# Patient Record
Sex: Male | Born: 1955
Health system: Southern US, Community
[De-identification: ages and names within clinical notes are randomized; demographics above are authoritative.]

## PROBLEM LIST (undated history)

## (undated) DIAGNOSIS — I10 Essential (primary) hypertension: Secondary | ICD-10-CM

## (undated) DIAGNOSIS — M19012 Primary osteoarthritis, left shoulder: Secondary | ICD-10-CM

## (undated) DIAGNOSIS — Z951 Presence of aortocoronary bypass graft: Secondary | ICD-10-CM

## (undated) DIAGNOSIS — N189 Chronic kidney disease, unspecified: Secondary | ICD-10-CM

## (undated) DIAGNOSIS — E119 Type 2 diabetes mellitus without complications: Secondary | ICD-10-CM

## (undated) DIAGNOSIS — M75111 Incomplete rotator cuff tear or rupture of right shoulder, not specified as traumatic: Secondary | ICD-10-CM

## (undated) DIAGNOSIS — M1990 Unspecified osteoarthritis, unspecified site: Secondary | ICD-10-CM

## (undated) DIAGNOSIS — I2510 Atherosclerotic heart disease of native coronary artery without angina pectoris: Secondary | ICD-10-CM

## (undated) DIAGNOSIS — M7542 Impingement syndrome of left shoulder: Secondary | ICD-10-CM

## (undated) DIAGNOSIS — I219 Acute myocardial infarction, unspecified: Secondary | ICD-10-CM

## (undated) DIAGNOSIS — K635 Polyp of colon: Secondary | ICD-10-CM

## (undated) DIAGNOSIS — E7800 Pure hypercholesterolemia, unspecified: Secondary | ICD-10-CM

## (undated) DIAGNOSIS — J42 Unspecified chronic bronchitis: Secondary | ICD-10-CM

## (undated) DIAGNOSIS — K219 Gastro-esophageal reflux disease without esophagitis: Secondary | ICD-10-CM

## (undated) DIAGNOSIS — S45119A Laceration of brachial artery, unspecified side, initial encounter: Secondary | ICD-10-CM

## (undated) DIAGNOSIS — R252 Cramp and spasm: Secondary | ICD-10-CM

## (undated) DIAGNOSIS — M519 Unspecified thoracic, thoracolumbar and lumbosacral intervertebral disc disorder: Secondary | ICD-10-CM

## (undated) DIAGNOSIS — K5730 Diverticulosis of large intestine without perforation or abscess without bleeding: Secondary | ICD-10-CM

## (undated) HISTORY — PX: COLONOSCOPY: SHX174

## (undated) HISTORY — DX: Gastro-esophageal reflux disease without esophagitis: K21.9

## (undated) HISTORY — PX: HAND SURGERY: SHX662

## (undated) HISTORY — PX: TONSILLECTOMY: SUR1361

## (undated) HISTORY — PX: BACK SURGERY: SHX140

## (undated) HISTORY — DX: Polyp of colon: K63.5

## (undated) HISTORY — PX: CARDIAC SURGERY: SHX584

## (undated) HISTORY — PX: OTHER SURGICAL HISTORY: SHX169

## (undated) HISTORY — PX: LUMBAR DISC SURGERY: SHX700

## (undated) SURGERY — Surgical Case
Anesthesia: *Unknown

---

## 1998-10-22 ENCOUNTER — Emergency Department (HOSPITAL_COMMUNITY): Admission: EM | Admit: 1998-10-22 | Discharge: 1998-10-22 | Payer: Self-pay | Admitting: Emergency Medicine

## 1999-09-28 ENCOUNTER — Ambulatory Visit (HOSPITAL_COMMUNITY): Admission: RE | Admit: 1999-09-28 | Discharge: 1999-09-28 | Payer: Self-pay | Admitting: Family Medicine

## 1999-09-28 ENCOUNTER — Encounter: Payer: Self-pay | Admitting: Family Medicine

## 2000-03-14 ENCOUNTER — Emergency Department (HOSPITAL_COMMUNITY): Admission: EM | Admit: 2000-03-14 | Discharge: 2000-03-14 | Payer: Self-pay | Admitting: *Deleted

## 2000-10-06 ENCOUNTER — Ambulatory Visit (HOSPITAL_COMMUNITY): Admission: RE | Admit: 2000-10-06 | Discharge: 2000-10-07 | Payer: Self-pay | Admitting: Cardiology

## 2001-01-03 ENCOUNTER — Ambulatory Visit (HOSPITAL_COMMUNITY): Admission: RE | Admit: 2001-01-03 | Discharge: 2001-01-03 | Payer: Self-pay | Admitting: Internal Medicine

## 2001-03-12 ENCOUNTER — Encounter: Payer: Self-pay | Admitting: Emergency Medicine

## 2001-03-12 ENCOUNTER — Inpatient Hospital Stay (HOSPITAL_COMMUNITY): Admission: EM | Admit: 2001-03-12 | Discharge: 2001-03-14 | Payer: Self-pay | Admitting: Emergency Medicine

## 2001-03-15 ENCOUNTER — Inpatient Hospital Stay (HOSPITAL_COMMUNITY): Admission: EM | Admit: 2001-03-15 | Discharge: 2001-03-16 | Payer: Self-pay | Admitting: Emergency Medicine

## 2001-03-15 ENCOUNTER — Encounter: Payer: Self-pay | Admitting: Emergency Medicine

## 2001-09-02 ENCOUNTER — Encounter: Payer: Self-pay | Admitting: Emergency Medicine

## 2001-09-02 ENCOUNTER — Inpatient Hospital Stay (HOSPITAL_COMMUNITY): Admission: EM | Admit: 2001-09-02 | Discharge: 2001-09-06 | Payer: Self-pay | Admitting: Emergency Medicine

## 2001-09-04 ENCOUNTER — Encounter: Payer: Self-pay | Admitting: Cardiology

## 2001-09-05 ENCOUNTER — Encounter: Payer: Self-pay | Admitting: Cardiology

## 2001-09-06 ENCOUNTER — Encounter: Payer: Self-pay | Admitting: Cardiology

## 2001-12-20 ENCOUNTER — Encounter: Payer: Self-pay | Admitting: Cardiology

## 2001-12-20 ENCOUNTER — Encounter: Admission: RE | Admit: 2001-12-20 | Discharge: 2001-12-20 | Payer: Self-pay | Admitting: Cardiology

## 2002-02-22 ENCOUNTER — Encounter: Payer: Self-pay | Admitting: Orthopedic Surgery

## 2002-02-22 ENCOUNTER — Inpatient Hospital Stay (HOSPITAL_COMMUNITY): Admission: AD | Admit: 2002-02-22 | Discharge: 2002-02-24 | Payer: Self-pay | Admitting: Orthopedic Surgery

## 2002-02-23 ENCOUNTER — Encounter: Payer: Self-pay | Admitting: Orthopedic Surgery

## 2002-02-24 ENCOUNTER — Encounter: Payer: Self-pay | Admitting: Orthopedic Surgery

## 2003-08-09 DIAGNOSIS — Z951 Presence of aortocoronary bypass graft: Secondary | ICD-10-CM

## 2003-08-09 HISTORY — PX: CORONARY ARTERY BYPASS GRAFT: SHX141

## 2003-08-09 HISTORY — DX: Presence of aortocoronary bypass graft: Z95.1

## 2003-09-03 ENCOUNTER — Inpatient Hospital Stay (HOSPITAL_COMMUNITY): Admission: EM | Admit: 2003-09-03 | Discharge: 2003-09-14 | Payer: Self-pay | Admitting: Emergency Medicine

## 2003-09-26 ENCOUNTER — Encounter: Admission: RE | Admit: 2003-09-26 | Discharge: 2003-09-26 | Payer: Self-pay | Admitting: Cardiology

## 2003-10-04 ENCOUNTER — Inpatient Hospital Stay (HOSPITAL_COMMUNITY): Admission: EM | Admit: 2003-10-04 | Discharge: 2003-10-07 | Payer: Self-pay | Admitting: Emergency Medicine

## 2004-01-19 ENCOUNTER — Other Ambulatory Visit: Payer: Self-pay

## 2004-02-19 ENCOUNTER — Emergency Department (HOSPITAL_COMMUNITY): Admission: EM | Admit: 2004-02-19 | Discharge: 2004-02-19 | Payer: Self-pay | Admitting: Emergency Medicine

## 2004-05-19 ENCOUNTER — Ambulatory Visit (HOSPITAL_COMMUNITY): Admission: RE | Admit: 2004-05-19 | Discharge: 2004-05-19 | Payer: Self-pay | Admitting: Internal Medicine

## 2004-06-03 ENCOUNTER — Observation Stay (HOSPITAL_COMMUNITY): Admission: EM | Admit: 2004-06-03 | Discharge: 2004-06-04 | Payer: Self-pay | Admitting: Emergency Medicine

## 2004-10-05 ENCOUNTER — Observation Stay (HOSPITAL_COMMUNITY): Admission: EM | Admit: 2004-10-05 | Discharge: 2004-10-07 | Payer: Self-pay | Admitting: Emergency Medicine

## 2004-12-03 ENCOUNTER — Ambulatory Visit: Payer: Self-pay | Admitting: Internal Medicine

## 2005-09-02 ENCOUNTER — Ambulatory Visit: Payer: Self-pay | Admitting: Internal Medicine

## 2006-03-24 ENCOUNTER — Ambulatory Visit: Payer: Self-pay | Admitting: Internal Medicine

## 2006-04-24 ENCOUNTER — Emergency Department: Payer: Self-pay | Admitting: Emergency Medicine

## 2006-09-08 ENCOUNTER — Emergency Department (HOSPITAL_COMMUNITY): Admission: EM | Admit: 2006-09-08 | Discharge: 2006-09-09 | Payer: Self-pay | Admitting: Emergency Medicine

## 2006-11-20 ENCOUNTER — Ambulatory Visit: Payer: Self-pay | Admitting: Internal Medicine

## 2007-02-04 ENCOUNTER — Emergency Department: Payer: Self-pay | Admitting: Emergency Medicine

## 2007-02-16 ENCOUNTER — Encounter: Admission: RE | Admit: 2007-02-16 | Discharge: 2007-02-16 | Payer: Self-pay | Admitting: Neurosurgery

## 2007-02-28 ENCOUNTER — Ambulatory Visit: Payer: Self-pay | Admitting: Vascular Surgery

## 2007-02-28 ENCOUNTER — Ambulatory Visit (HOSPITAL_COMMUNITY): Admission: RE | Admit: 2007-02-28 | Discharge: 2007-02-28 | Payer: Self-pay | Admitting: Neurosurgery

## 2007-08-27 ENCOUNTER — Encounter: Payer: Self-pay | Admitting: Internal Medicine

## 2007-08-27 DIAGNOSIS — J302 Other seasonal allergic rhinitis: Secondary | ICD-10-CM | POA: Insufficient documentation

## 2007-08-27 DIAGNOSIS — I2510 Atherosclerotic heart disease of native coronary artery without angina pectoris: Secondary | ICD-10-CM | POA: Insufficient documentation

## 2007-08-27 DIAGNOSIS — J3089 Other allergic rhinitis: Secondary | ICD-10-CM

## 2007-08-27 DIAGNOSIS — E785 Hyperlipidemia, unspecified: Secondary | ICD-10-CM | POA: Insufficient documentation

## 2007-08-27 DIAGNOSIS — K219 Gastro-esophageal reflux disease without esophagitis: Secondary | ICD-10-CM | POA: Insufficient documentation

## 2007-08-27 DIAGNOSIS — J45998 Other asthma: Secondary | ICD-10-CM | POA: Insufficient documentation

## 2007-09-10 ENCOUNTER — Ambulatory Visit: Payer: Self-pay | Admitting: Internal Medicine

## 2007-09-10 DIAGNOSIS — T6391XA Toxic effect of contact with unspecified venomous animal, accidental (unintentional), initial encounter: Secondary | ICD-10-CM | POA: Insufficient documentation

## 2008-01-22 ENCOUNTER — Inpatient Hospital Stay (HOSPITAL_COMMUNITY): Admission: EM | Admit: 2008-01-22 | Discharge: 2008-01-24 | Payer: Self-pay | Admitting: Emergency Medicine

## 2008-01-22 ENCOUNTER — Ambulatory Visit: Payer: Self-pay | Admitting: Internal Medicine

## 2008-03-19 ENCOUNTER — Ambulatory Visit: Payer: Self-pay | Admitting: Internal Medicine

## 2008-10-27 ENCOUNTER — Ambulatory Visit: Payer: Self-pay | Admitting: Internal Medicine

## 2008-11-14 ENCOUNTER — Encounter: Payer: Self-pay | Admitting: Internal Medicine

## 2008-12-28 ENCOUNTER — Emergency Department: Payer: Self-pay | Admitting: Internal Medicine

## 2009-02-26 ENCOUNTER — Encounter: Payer: Self-pay | Admitting: Orthopedic Surgery

## 2009-03-08 ENCOUNTER — Encounter: Payer: Self-pay | Admitting: Orthopedic Surgery

## 2009-04-08 ENCOUNTER — Encounter: Payer: Self-pay | Admitting: Orthopedic Surgery

## 2009-04-16 ENCOUNTER — Telehealth (INDEPENDENT_AMBULATORY_CARE_PROVIDER_SITE_OTHER): Payer: Self-pay | Admitting: *Deleted

## 2009-05-08 ENCOUNTER — Telehealth: Payer: Self-pay | Admitting: Internal Medicine

## 2009-05-08 ENCOUNTER — Telehealth (INDEPENDENT_AMBULATORY_CARE_PROVIDER_SITE_OTHER): Payer: Self-pay | Admitting: *Deleted

## 2009-05-11 ENCOUNTER — Ambulatory Visit: Payer: Self-pay | Admitting: Internal Medicine

## 2009-05-11 DIAGNOSIS — J209 Acute bronchitis, unspecified: Secondary | ICD-10-CM | POA: Insufficient documentation

## 2009-06-30 ENCOUNTER — Emergency Department (HOSPITAL_COMMUNITY): Admission: EM | Admit: 2009-06-30 | Discharge: 2009-06-30 | Payer: Self-pay | Admitting: Emergency Medicine

## 2009-08-04 ENCOUNTER — Telehealth (INDEPENDENT_AMBULATORY_CARE_PROVIDER_SITE_OTHER): Payer: Self-pay | Admitting: *Deleted

## 2009-08-06 ENCOUNTER — Telehealth: Payer: Self-pay | Admitting: Internal Medicine

## 2009-08-25 ENCOUNTER — Ambulatory Visit: Payer: Self-pay | Admitting: Internal Medicine

## 2009-11-24 ENCOUNTER — Ambulatory Visit: Payer: Self-pay | Admitting: Internal Medicine

## 2009-12-10 ENCOUNTER — Telehealth (INDEPENDENT_AMBULATORY_CARE_PROVIDER_SITE_OTHER): Payer: Self-pay | Admitting: *Deleted

## 2009-12-15 ENCOUNTER — Telehealth: Payer: Self-pay | Admitting: Internal Medicine

## 2009-12-15 ENCOUNTER — Ambulatory Visit: Payer: Self-pay | Admitting: Internal Medicine

## 2010-05-25 ENCOUNTER — Ambulatory Visit: Payer: Self-pay | Admitting: Internal Medicine

## 2010-05-27 ENCOUNTER — Telehealth: Payer: Self-pay | Admitting: Internal Medicine

## 2010-06-30 ENCOUNTER — Telehealth: Payer: Self-pay | Admitting: Internal Medicine

## 2010-09-07 NOTE — Progress Notes (Signed)
Summary: z pac request  Phone Note Call from Patient   Caller: Spouse Call For: young Summary of Call: pt c/o of non-productive cough/ sinus drainage/ sore throat x 1 day. denies fever. [facility] [place] spouse- sybil cell # [phone] Initial call taken by: Tivis Ringer,  April 16, 2009 4:40 PM  Follow-up for Phone Call        Please advise if okay to call in zpack.  Pt is allergic to PCN. Thanks! Vernie Murders  April 16, 2009 4:42 PM   Additional Follow-up for Phone Call Additional follow up Details #1::        ok per CY: zpak #1 no refills, 2 today then 1 daily until gone.  [facility] [place] [facility]'s [facility].  pt is aware. Additional Follow-up by: Boone Master CNA,  April 16, 2009 5:39 PM    New/Updated Medications: ZITHROMAX Z-PAK 250 MG TABS (AZITHROMYCIN) 2 today then 1 daily until gone Prescriptions: ZITHROMAX Z-PAK 250 MG TABS (AZITHROMYCIN) 2 today then 1 daily until gone  #1 x 0   Entered by:   Boone Master CNA   Authorized by:   Waymon Budge MD   Signed by:   Boone Master CNA on 04/16/2009   Method used:   [facility]        [phone]ail[facility]       [address]       [place], [place]  [address]       Ph: [phone] or [phone]       Fax: [phone]   RxID:   [id number]

## 2010-09-07 NOTE — Assessment & Plan Note (Signed)
Summary: FOLLOW UP/ MBW   Visit Type:  Follow-up PCP:  Karam  Chief Complaint:  follow up visit .  History of Present Illness: Current Problems:  Hx of INSECT BITE, VENOMOUS (ICD-989.5) DYSLIPIDEMIA (ICD-272.4) CORONARY ARTERY DISEASE (ICD-414.00) G E R D (ICD-530.81) ASTHMA (ICD-493.90) ALLERGIC RHINITIS (ICD-12.43)  55 year old man returning with his wife for follow-up of allergic rhinitis and asthma.  Shifting nasal congestion,  as he changes  sides in bed.  Hot weather is bothering him.  Can blow little from  from his nose, and denies sneezing. chest comfortable, without wheezes lungs he remains on his medications.  He has been approved for a medical support program through one of the drug companies..       Prior Medications Reviewed Using: Patient Recall  Updated Prior Medication List: IMDUR 60 MG  TB24 (ISOSORBIDE MONONITRATE)  * VITAMIN B COMPLEX  TOPROL XL 50 MG  TB24 (METOPROLOL SUCCINATE)  ZETIA 10 MG  TABS (EZETIMIBE)  BAYER ASPIRIN 325 MG  TABS (ASPIRIN) take 1 by mouth once daily TRAZODONE HCL 100 MG  TABS (TRAZODONE HCL)  ZYRTEC ALLERGY 10 MG  TABS (CETIRIZINE HCL) take 1 by mouth once daily PROTONIX 20 MG  TBEC (PANTOPRAZOLE SODIUM) take 1 by mouth once daily CRESTOR 20 MG  TABS (ROSUVASTATIN CALCIUM) take 1 by mouth once daily NITROGLYCERIN 0.4 MG  SUBL (NITROGLYCERIN) use as needed XOPENEX HFA 45 MCG/ACT  AERO (LEVALBUTEROL TARTRATE) 2 puffs three times a day as needed ASTELIN 137 MCG/SPRAY  SOLN (AZELASTINE HCL) ues as needed EPIPEN 2-PAK 0.3 MG/0.3ML (1:1000)  DEVI (EPINEPHRINE HCL (ANAPHYLAXIS))  IPRATROPIUM-ALBUTEROL 0.5-2.5 (3) MG/3ML  SOLN (IPRATROPIUM-ALBUTEROL) 1 neb four times a day as needed FORADIL AEROLIZER 12 MCG  CAPS (FORMOTEROL FUMARATE) 1 twice daily SM LORATADINE 10 MG  TABS (LORATADINE) take 1 by mouth once daily  Current Allergies (reviewed today): ! PENICILLIN  Past Medical History:     Reviewed history from 08/27/2007 and no changes required:       Allergic Rhinitis       Asthma       G E R D   Social History:    Reviewed history from 09/10/2007 and no changes required:       Patient states former smoker.     Review of Systems       Denies headache, sinus drainage, sneezing chest pain, dyspnea, n/v/d, weight loss, fever, edema.     Vital Signs:  Patient Profile:   55 Years Old Male Weight:      208.13 pounds O2 Sat:      94 % O2 treatment:    Room Air Pulse rate:   70 / minute BP sitting:   106 / 68  (left arm) Cuff size:   regular  Vitals Entered By: Reynaldo Minium CMA (March 19, 2008 11:03 AM)             Comments Medications reviewed with patient Reynaldo Minium CMA  March 19, 2008 11:04 AM      Physical Exam  General: A/Ox3; pleasant and cooperative, NAD, SKIN: no rash, lesions NODES: no lymphadenopathy NECK: Supple w/ fair ROM, JVD- none, normal carotid impulses w/o bruits Thyroid- normal to palpation CHEST: Clear to P&A HEART: RRR, no m/g/r heard ABDOMEN: Soft and nl; nml bowel sounds; no organomegaly or masses noted ZDG:UYQI, nl pulses, no edema  NEURO: Grossly intact to observation HEENT: [place]/AT, EOM- wnl, Conjunctivae- clear, PERRLA, TMs- wnl, Nose-snorting and sniffing, narrow but clear nose, Throat- clear and  wnl         Problem # 1:  ALLERGIC RHINITIS (ICD-477.9) symptomatic rhinitis.  Plan: Breathe Right strips, Neti pot nasal lavage.we discussed his Genella Rife.  He will change to Nexium because of his insurance plan support. His updated medication list for this problem includes:    Zyrtec Allergy 10 Mg Tabs (Cetirizine hcl) .Marland Kitchen... Take 1 by mouth once daily    Astelin 137 Mcg/spray Soln (Azelastine hcl) ..... Ues as needed    Sm Loratadine 10 Mg Tabs (Loratadine) .Marland Kitchen... Take 1 by mouth once daily   Problem # 2:  ASTHMA (ICD-493.90) Assessment: Unchanged  Problem # 3:  G E R D (ICD-530.81) Nexium   Medications Added to Medication List This Visit: 1)  Bayer Aspirin 325 Mg Tabs (Aspirin) .... Take 1 by mouth once daily 2)  Zyrtec Allergy 10 Mg Tabs (Cetirizine hcl) .... Take 1 by mouth once daily 3)  Protonix 20 Mg Tbec (Pantoprazole sodium) .... Take 1 by mouth once daily 4)  Nexium 40 Mg Cpdr (esomeprazole Magnesium)  .Marland Kitchen.. 1 daily 5)  Crestor 20 Mg Tabs (Rosuvastatin calcium) .... Take 1 by mouth once daily   Patient Instructions: 1)  Please schedule a follow-up appointment in 6 months. 2)  Changed Protonix to Nexium because of your assistance plan. 3)  Try Breath Right nasal strips for stuffiness, especialy at night. 4)  Try Neti pot to rinse your nasal passages.   Prescriptions: NEXIUM 40 MG  CPDR (ESOMEPRAZOLE MAGNESIUM) 1 daily  #90 x 3   Entered and Authorized by:   Waymon Budge MD   Signed by:   Waymon Budge MD on 03/19/2008   Method used:   Print then Give to Patient   RxID:   [id number]  ]
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
## 2010-09-07 NOTE — Progress Notes (Signed)
Summary: req zpac today- congestion  Phone Note Call from Patient Call back at Home Phone [phone]   Caller: Spouse-sybil Call For: [name] Summary of Call: per spouse- pt c/o chest congestion x 1 day. no fever. says dr Hansini Clodfelter usually prescribes zpac. wants this today if possible. [facility] [address] in [place].  Initial call taken by: Tivis Ringer, CNA,  June 30, 2010 4:39 PM  Follow-up for Phone Call        Spoke with pt wife and she states that pt is having chest congestion, no cough yet but she states he gets this every year and Dr. Maple Hudson calls in a Zpak and this gets rid of it.  I spoke to Dr. Maple Hudson and he ok to send in Z pak. rx sent. pt aware.Carron Curie CMA  June 30, 2010 4:50 PM     New/Updated Medications: ZITHROMAX Z-PAK 250 MG TABS (AZITHROMYCIN) as directed Prescriptions: ZITHROMAX Z-PAK 250 MG TABS (AZITHROMYCIN) as directed  #1 pak x 0   Entered by:   Carron Curie CMA   Authorized by:   Waymon Budge MD   Signed by:   Carron Curie CMA on 06/30/2010   Method used:   [facility] to        [facility] [address] (retail)       [address]       [place], [place]  [address]       Ph: [phone]       Fax: [phone]   RxID:   [id number]

## 2010-09-07 NOTE — Progress Notes (Signed)
Summary: rx request/ sinus  Phone Note Call from Patient Call back at Home Phone [phone]   Caller: Spouse Call For: young Summary of Call: pt's spouse sybil says pt c/o sinus infection x 2 days. cough is non-productive. denies fever. no N or V. [facility] [address] in [place]. pt requests a rx called in.  Initial call taken by: Tivis Ringer, CNA,  [place]  5, 2011 10:03 AM  Follow-up for Phone Call        The patient c/o sinus pain and ptrssure for 2-3 days. He says his nasal passages feel full and he has a scratchy throat. The cough is non-prod. He denies any sob or fever. Please advise.Michel Bickers CMA  [place]  5, 2011 10:11 AM Allergies (verified):  1)  ! Penicillin  Additional Follow-up for Phone Call Additional follow up Details #1::        Per CDY-give Zpak # 1 take as directed no refills.Reynaldo Minium CMA  [place]  5, 2011 12:14 PM     Additional Follow-up for Phone Call Additional follow up Details #2::    Zpack was sent pt aware. Vernie Murders  Fore  5, 2011 12:34 PM   New/Updated Medications: ZITHROMAX Z-PAK 250 MG TABS (AZITHROMYCIN) take as directed Prescriptions: ZITHROMAX Z-PAK 250 MG TABS (AZITHROMYCIN) take as directed  #1 x 0   Entered by:   Vernie Murders   Authorized by:   Waymon Budge MD   Signed by:   Vernie Murders on 12/10/2009   Method used:   [facility] to        [facility] [address]       [address]       [place], [place]  [address]       Ph: [phone]       Fax: [phone]   RxID:   [id number]
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
## 2010-09-07 NOTE — Progress Notes (Signed)
Summary: pro- air  Phone Note Call from Patient   Caller: Spouse sybil Call For: Jerime Arif Summary of Call: have questions about pro air. Initial call taken by: Rickard Patience,  May 27, 2010 3:51 PM  Follow-up for Phone Call        called and spoke with pt and she stated that the proair rx that was given is not covered by the astra-[employer] program---she read all the names to me and none of the rescue inhalers were on this list---i explained to pt that she could contact the insurance company to find out which inhaler they will cover.  pt voiced her understanding on this  Randell Loop CMA  May 27, 2010 3:59 PM
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
## 2010-09-07 NOTE — Progress Notes (Signed)
Summary: prescription question  Phone Note Call from Patient Call back at Home Phone [phone]   Caller: Spouse/sybil Call For: Aiman Sonn Summary of Call: Wants to speak to nurse, re: question about clarithromycin 500mg  Initial call taken by: Darletta Moll,  [place] 10, 2011 4:17 PM  Follow-up for Phone Call        called and spoke with pt's wife, Gillis Ends.  Sybil states Clarithromycin is too expensive and cannot afford.  Requests something cheaper.  Please advise.  Thank you.  Arman Filter LPN  [place] 10, 2011 4:33 PM   Additional Follow-up for Phone Call Additional follow up Details #1::        DC clarithromycin Replace with doxycycline 100 mg, # 8, 2 today then one daily Additional Follow-up by: Waymon Budge MD,  [place] 10, 2011 4:40 PM    Additional Follow-up for Phone Call Additional follow up Details #2::    called and spoke with pts wife and she is aware of clarithromycin being changed to doxy 100mg   #8  2 TODAY THEN ONE DAILY UNTIL GONE.  this has been sent in to the pharmacy and pts wife is aware Randell Loop CMA  [place] 10, 2011 4:45 PM   New/Updated Medications: DOXYCYCLINE HYCLATE 100 MG TABS (DOXYCYCLINE HYCLATE) take 2 by mouth today then one daily until gone Prescriptions: DOXYCYCLINE HYCLATE 100 MG TABS (DOXYCYCLINE HYCLATE) take 2 by mouth today then one daily until gone  #8 x 0   Entered by:   Randell Loop CMA   Authorized by:   Waymon Budge MD   Signed by:   Randell Loop CMA on 12/15/2009   Method used:   Electronically to        [facility] (retail)       [address], [address]       [place], [place]  [address]       Ph: [phone]       Fax: [phone]   RxID:   [id number]
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
## 2010-09-07 NOTE — Progress Notes (Signed)
Summary: needs zpak  Phone Note Call from Patient Call back at Home Phone [phone]   Caller: [phone] Call For: young Summary of Call: can pt get a zpak has a lot of mucus build up mucinex isnt helping Initial call taken by: Lacinda Axon,  August 04, 2009 9:22 AM  Follow-up for Phone Call        called and spoke with pt.  pt c/o increased sob with exertion and at rest, head and chest congestion, unable to cough up sputum, tightness in chest, ? fever ( hasn't taken his temp) and sinus pressure.  Symptoms started 3 days ago.  Pt states he is taking Mucinex to help thin secretions to cough up but states this isn't helping.  Please advise.  Thank you.Marland KitchenMarland KitchenMarland KitchenArman Filter LPN  August 04, 2009 9:37 AM     ALLERGIES:  PCN   ********* SICK******************[id number]  Additional Follow-up for Phone Call Additional follow up Details #1::        per CY:  Offer Zpak.   called and spoke with pt.  informed him rx sent to [facility]. Megan Reynolds LPN  August 04, 2009 10:19 AM     New/Updated Medications: ZITHROMAX Z-PAK 250 MG TABS (AZITHROMYCIN) take as directed Prescriptions: ZITHROMAX Z-PAK 250 MG TABS (AZITHROMYCIN) take as directed  #1 x 0   Entered by:   Arman Filter LPN   Authorized by:   Waymon Budge MD   Signed by:   Arman Filter LPN on 84/13/2440   Method used:   [facility] to        [facility] [phone]* (retail)       [address]       [place], [place]  [address]       Ph: [phone] or [phone]       Fax: [phone]   RxID:   [id number]
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
## 2010-09-07 NOTE — Medication Information (Signed)
Summary: Reorder Rx Foradil Aerolizer/[employer]   Reorder Rx Foradil Aerolizer/[employer]   Imported By: Sherian Rein 11/19/2008 08:07:41  _____________________________________________________________________  External Attachment:    Type:   Image     Comment:   External Document

## 2010-09-17 ENCOUNTER — Inpatient Hospital Stay (HOSPITAL_COMMUNITY)
Admission: EM | Admit: 2010-09-17 | Discharge: 2010-09-21 | DRG: 287 | Disposition: A | Discharge status: Home / Self Care | Payer: Medicaid Other | Attending: Cardiology | Admitting: Cardiology

## 2010-09-17 ENCOUNTER — Emergency Department (HOSPITAL_COMMUNITY): Payer: Medicaid Other

## 2010-09-17 DIAGNOSIS — Z7982 Long term (current) use of aspirin: Secondary | ICD-10-CM

## 2010-09-17 DIAGNOSIS — J4489 Other specified chronic obstructive pulmonary disease: Secondary | ICD-10-CM | POA: Diagnosis present

## 2010-09-17 DIAGNOSIS — J449 Chronic obstructive pulmonary disease, unspecified: Secondary | ICD-10-CM | POA: Diagnosis present

## 2010-09-17 DIAGNOSIS — I2510 Atherosclerotic heart disease of native coronary artery without angina pectoris: Secondary | ICD-10-CM | POA: Diagnosis present

## 2010-09-17 DIAGNOSIS — I10 Essential (primary) hypertension: Secondary | ICD-10-CM | POA: Diagnosis present

## 2010-09-17 DIAGNOSIS — E669 Obesity, unspecified: Secondary | ICD-10-CM | POA: Diagnosis present

## 2010-09-17 DIAGNOSIS — I25810 Atherosclerosis of coronary artery bypass graft(s) without angina pectoris: Secondary | ICD-10-CM | POA: Diagnosis present

## 2010-09-17 DIAGNOSIS — R0789 Other chest pain: Principal | ICD-10-CM | POA: Diagnosis present

## 2010-09-17 DIAGNOSIS — E785 Hyperlipidemia, unspecified: Secondary | ICD-10-CM | POA: Diagnosis present

## 2010-09-17 DIAGNOSIS — M129 Arthropathy, unspecified: Secondary | ICD-10-CM | POA: Diagnosis present

## 2010-09-17 DIAGNOSIS — K219 Gastro-esophageal reflux disease without esophagitis: Secondary | ICD-10-CM | POA: Diagnosis present

## 2010-09-17 DIAGNOSIS — F411 Generalized anxiety disorder: Secondary | ICD-10-CM | POA: Diagnosis present

## 2010-09-17 DIAGNOSIS — Z79899 Other long term (current) drug therapy: Secondary | ICD-10-CM

## 2010-09-17 LAB — POCT I-STAT, CHEM 8
BUN: 20 mg/dL (ref 6–23)
Calcium, Ion: 0.83 mmol/L — ABNORMAL LOW (ref 1.12–1.32)
Chloride: 110 mEq/L (ref 96–112)
Creatinine, Ser: 1.3 mg/dL (ref 0.4–1.5)
Glucose, Bld: 135 mg/dL — ABNORMAL HIGH (ref 70–99)
HCT: 42 % (ref 39.0–52.0)
Hemoglobin: 14.3 g/dL (ref 13.0–17.0)
Potassium: 4.9 mEq/L (ref 3.5–5.1)
Sodium: 135 mEq/L (ref 135–145)
TCO2: 18 mmol/L (ref 0–100)

## 2010-09-17 LAB — URINALYSIS, ROUTINE W REFLEX MICROSCOPIC
Bilirubin Urine: NEGATIVE
Hgb urine dipstick: NEGATIVE
Ketones, ur: NEGATIVE mg/dL
Nitrite: NEGATIVE
Protein, ur: NEGATIVE mg/dL
Specific Gravity, Urine: 1.028 (ref 1.005–1.030)
Urine Glucose, Fasting: NEGATIVE mg/dL
Urobilinogen, UA: 1 mg/dL (ref 0.0–1.0)
pH: 5.5 (ref 5.0–8.0)

## 2010-09-17 LAB — POCT CARDIAC MARKERS
CKMB, poc: <1 ng/mL — ABNORMAL LOW (ref 1.0–8.0)
CKMB, poc: <1 ng/mL — ABNORMAL LOW (ref 1.0–8.0)
Myoglobin, poc: 113 ng/mL (ref 12–200)
Myoglobin, poc: 110 ng/mL (ref 12–200)
Troponin i, poc: <0.05 ng/mL (ref 0.00–0.09)
Troponin i, poc: <0.05 ng/mL (ref 0.00–0.09)

## 2010-09-17 LAB — DIFFERENTIAL
Basophils Absolute: 0 10*3/uL (ref 0.0–0.1)
Basophils Relative: 0 % (ref 0–1)
Eosinophils Absolute: 0.1 10*3/uL (ref 0.0–0.7)
Eosinophils Relative: 1 % (ref 0–5)
Lymphocytes Relative: 7 % — ABNORMAL LOW (ref 12–46)
Lymphs Abs: 0.9 10*3/uL (ref 0.7–4.0)
Monocytes Absolute: 1.1 10*3/uL — ABNORMAL HIGH (ref 0.1–1.0)
Monocytes Relative: 9 % (ref 3–12)
Neutro Abs: 10.9 10*3/uL — ABNORMAL HIGH (ref 1.7–7.7)
Neutrophils Relative %: 84 % — ABNORMAL HIGH (ref 43–77)

## 2010-09-17 LAB — CBC
HCT: 42 % (ref 39.0–52.0)
Hemoglobin: 14.7 g/dL (ref 13.0–17.0)
MCH: 29.4 pg (ref 26.0–34.0)
MCHC: 35 g/dL (ref 30.0–36.0)
MCV: 84 fL (ref 78.0–100.0)
Platelets: 183 10*3/uL (ref 150–400)
RBC: 5 MIL/uL (ref 4.22–5.81)
RDW: 13 % (ref 11.5–15.5)
WBC: 13.1 10*3/uL — ABNORMAL HIGH (ref 4.0–10.5)

## 2010-09-17 LAB — MAGNESIUM: Magnesium: 1.5 mg/dL (ref 1.5–2.5)

## 2010-09-18 DIAGNOSIS — I200 Unstable angina: Secondary | ICD-10-CM

## 2010-09-18 LAB — CBC
HCT: 37.8 % — ABNORMAL LOW (ref 39.0–52.0)
HCT: 38.5 % — ABNORMAL LOW (ref 39.0–52.0)
Hemoglobin: 12.9 g/dL — ABNORMAL LOW (ref 13.0–17.0)
Hemoglobin: 13.3 g/dL (ref 13.0–17.0)
MCH: 28.9 pg (ref 26.0–34.0)
MCH: 29.4 pg (ref 26.0–34.0)
MCHC: 34.1 g/dL (ref 30.0–36.0)
MCHC: 34.5 g/dL (ref 30.0–36.0)
MCV: 84.6 fL (ref 78.0–100.0)
MCV: 85 fL (ref 78.0–100.0)
Platelets: 163 10*3/uL (ref 150–400)
Platelets: 154 10*3/uL (ref 150–400)
RBC: 4.47 MIL/uL (ref 4.22–5.81)
RBC: 4.53 MIL/uL (ref 4.22–5.81)
RDW: 13.3 % (ref 11.5–15.5)
RDW: 13.4 % (ref 11.5–15.5)
WBC: 9.7 10*3/uL (ref 4.0–10.5)
WBC: 8.3 10*3/uL (ref 4.0–10.5)

## 2010-09-18 LAB — MRSA PCR SCREENING: MRSA by PCR: POSITIVE — AB

## 2010-09-18 LAB — CARDIAC PANEL(CRET KIN+CKTOT+MB+TROPI)
CK, MB: 1.3 ng/mL (ref 0.3–4.0)
CK, MB: 1.3 ng/mL (ref 0.3–4.0)
Relative Index: 1 (ref 0.0–2.5)
Relative Index: 1.1 (ref 0.0–2.5)
Total CK: 136 U/L (ref 7–232)
Total CK: 120 U/L (ref 7–232)
Troponin I: <0.01 ng/mL (ref 0.00–0.06)
Troponin I: <0.01 ng/mL (ref 0.00–0.06)

## 2010-09-18 LAB — LIPID PANEL
Cholesterol: 133 mg/dL (ref 0–200)
HDL: 46 mg/dL (ref >39)
LDL Cholesterol: 62 mg/dL (ref 0–99)
Total CHOL/HDL Ratio: 2.9 RATIO
Triglycerides: 127 mg/dL (ref <150)
VLDL: 25 mg/dL (ref 0–40)

## 2010-09-18 LAB — HEPARIN LEVEL (UNFRACTIONATED)
Heparin Unfractionated: 0.18 IU/mL — ABNORMAL LOW (ref 0.30–0.70)
Heparin Unfractionated: 0.28 IU/mL — ABNORMAL LOW (ref 0.30–0.70)
Heparin Unfractionated: 0.41 IU/mL (ref 0.30–0.70)

## 2010-09-19 LAB — BASIC METABOLIC PANEL
BUN: 11 mg/dL (ref 6–23)
CO2: 28 mEq/L (ref 19–32)
Calcium: 8.4 mg/dL (ref 8.4–10.5)
Chloride: 104 mEq/L (ref 96–112)
Creatinine, Ser: 1.12 mg/dL (ref 0.4–1.5)
GFR calc Af Amer: >60 mL/min (ref >60)
GFR calc non Af Amer: >60 mL/min (ref >60)
Glucose, Bld: 114 mg/dL — ABNORMAL HIGH (ref 70–99)
Potassium: 4.4 mEq/L (ref 3.5–5.1)
Sodium: 137 mEq/L (ref 135–145)

## 2010-09-19 LAB — CBC
HCT: 40 % (ref 39.0–52.0)
Hemoglobin: 13.9 g/dL (ref 13.0–17.0)
MCH: 29 pg (ref 26.0–34.0)
MCHC: 34.8 g/dL (ref 30.0–36.0)
MCV: 83.3 fL (ref 78.0–100.0)
Platelets: 166 10*3/uL (ref 150–400)
RBC: 4.8 MIL/uL (ref 4.22–5.81)
RDW: 13 % (ref 11.5–15.5)
WBC: 6.7 10*3/uL (ref 4.0–10.5)

## 2010-09-19 LAB — HEPARIN LEVEL (UNFRACTIONATED)
Heparin Unfractionated: 0.38 IU/mL (ref 0.30–0.70)
Heparin Unfractionated: 0.46 IU/mL (ref 0.30–0.70)

## 2010-09-20 LAB — CBC
HCT: 42.5 % (ref 39.0–52.0)
Hemoglobin: 14.5 g/dL (ref 13.0–17.0)
MCH: 28.5 pg (ref 26.0–34.0)
MCHC: 34.1 g/dL (ref 30.0–36.0)
MCV: 83.5 fL (ref 78.0–100.0)
Platelets: 192 10*3/uL (ref 150–400)
RBC: 5.09 MIL/uL (ref 4.22–5.81)
RDW: 13 % (ref 11.5–15.5)
WBC: 6.5 10*3/uL (ref 4.0–10.5)

## 2010-09-20 LAB — SEDIMENTATION RATE: Sed Rate: 10 mm/hr (ref 0–16)

## 2010-09-20 LAB — HEPARIN LEVEL (UNFRACTIONATED): Heparin Unfractionated: 0.4 IU/mL (ref 0.30–0.70)

## 2010-09-21 LAB — BASIC METABOLIC PANEL
BUN: 14 mg/dL (ref 6–23)
CO2: 27 mEq/L (ref 19–32)
Calcium: 9.3 mg/dL (ref 8.4–10.5)
Chloride: 102 mEq/L (ref 96–112)
Creatinine, Ser: 1.06 mg/dL (ref 0.4–1.5)
GFR calc Af Amer: >60 mL/min (ref >60)
GFR calc non Af Amer: >60 mL/min (ref >60)
Glucose, Bld: 108 mg/dL — ABNORMAL HIGH (ref 70–99)
Potassium: 4.5 mEq/L (ref 3.5–5.1)
Sodium: 137 mEq/L (ref 135–145)

## 2010-09-21 LAB — CBC
HCT: 43.8 % (ref 39.0–52.0)
Hemoglobin: 15.1 g/dL (ref 13.0–17.0)
MCH: 28.7 pg (ref 26.0–34.0)
MCHC: 34.5 g/dL (ref 30.0–36.0)
MCV: 83.1 fL (ref 78.0–100.0)
Platelets: 203 10*3/uL (ref 150–400)
RBC: 5.27 MIL/uL (ref 4.22–5.81)
RDW: 13.1 % (ref 11.5–15.5)
WBC: 8.5 10*3/uL (ref 4.0–10.5)

## 2010-09-21 LAB — POCT ACTIVATED CLOTTING TIME: Activated Clotting Time: 128 seconds
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
## 2010-09-23 NOTE — Discharge Summary (Signed)
[name], [name]                   ACCOUNT NO.:  [id number]  MEDICAL RECORD NO.:  [id number]           PATIENT TYPE:  I  LOCATION:  2005                         FACILITY:  [facility]  PHYSICIAN:  Georga Hacking, M.D.DATE OF BIRTH:  1956/05/06  DATE OF ADMISSION:  09/17/2010 DATE OF DISCHARGE:  09/21/2010                              DISCHARGE SUMMARY   FINAL DIAGNOSES: 1. Chest pain of uncertain etiology, myocardial function, ruled out. 2. Anxiety. 3. Asthma. 4. History of esophageal reflux. 5. Coronary artery disease with previous bypass grafting with patent     bypass grafts. 6. Dyslipidemia, under treatment. 7. Hypertension. 8. Obesity.  PROCEDURE:  Cardiac catheterization.  HISTORY:  A 55 year old male with known history of coronary artery disease, previous stents, and previous coronary bypass.  The patient developed midsternal chest discomfort at rest with radiation to both shoulders and rated 7/10.  He has a history of significant anxiety.  He also complained of aching all over with pain involving his hips and feet and also had some shaking and some chills evidently also.  Please see the previously dictated history and physical for remainder of the details.  HOSPITAL COURSE:  The patient was admitted to the hospital to rule out a myocardial infarction.  White count is 13,100 on admission, hemoglobin is 14.3, hematocrit 42, platelet count 183,000.  Chemistry panel showed a sodium of 137, potassium 4.4, chloride 104, CO2 of 28, glucose 114, BUN 11, creatinine 1.12.  Serial cardiac enzymes and troponin were normal.  Cholesterol panel shows a total cholesterol of 133, triglycerides of 127, HDL of 46, and LDL of 62.  Urinalysis was normal. The patient was brought into the hospital and had an EKG that was normal.  His chest discomfort resolved and he was taken to the cardiac catheterization laboratory.  Cardiac catheterization showed a patent internal mammary to the LAD and  a patent right internal mammary to the right coronary artery, the previous saphenous vein grafts were occluded and LV function was normal.  He had disease involving a diagonal branch, but for the most part was not felt to have significant enough disease to need significant intervention.  He was ambulatory in the hall the next day and was discharged home in improved condition.  He had no recurrence of pain.  It was unclear whether the discomfort was cardiac in nature or not.  Since his grafts were patent, we elected to let him go home. Because he is complaining of significant aching I opted to keep him on his same medications, which included Symbicort inhaler 2 puffs b.i.d., magnesium oxide daily, imipramine 25 at bedtime, trazodone 100 mg daily, metoprolol 25 b.i.d., aspirin 81 mg daily, Nexium 40 mg daily, loratadine 10 mg daily, albuterol inhaler 1 puff daily as needed, ProAir inhaler as needed.  He is to discontinue Vytorin at the time and to see if his aching improves.  He is to follow up with me in 2 weeks and is to call if there are recurrent problems.     Georga Hacking, M.D.     WST/MEDQ  D:  09/21/2010  T:  09/21/2010  Job:  [id number]  cc:   Georga Hacking, M.D.  Electronically Signed by Lacretia Nicks. Donnie Aho M.D. on 09/23/2010 09:33:47 AM

## 2010-09-23 NOTE — Procedures (Signed)
[name], [name]                   ACCOUNT NO.:  [id number]  MEDICAL RECORD NO.:  [id number]           PATIENT TYPE:  I  LOCATION:  2005                         FACILITY:  [facility]  PHYSICIAN:  Georga Hacking, M.D.DATE OF BIRTH:  [birth year]  DATE OF PROCEDURE:  09/20/2010                            CARDIAC CATHETERIZATION   HISTORY:  A 55 year old male with known coronary artery disease, asthma, arthritis, and previous bypass grafting.  Previous vein grafts have been occluded.  He presented to the hospital with prolonged chest discomfort, but also had other atypical symptoms and catheterization was advised to exclude progressive disease.  PROCEDURES: 1. Left heart catheterization with coronary angiograms. 2. Left ventriculogram. 3. Bypass angiograms.  DESCRIPTION OF PROCEDURE:  The patient was brought to the cath lab and prepped and draped in usual manner.  After Xylocaine anesthesia, a 5- French sheath was placed in the right femoral artery percutaneously. Angiograms were made using 5-French catheters.  The left mammary artery was selected with the internal mammary catheter.  The right mammary artery was very difficult to subselect and a flush injection was made of the subclavian, showing flow into the distal vessel.  In 2009, the right internal mammary had been selectively injected, was widely patent with a patent distal anastomotic site at that time.  The sheath was removed in the room due to a MRSA culture being positive.  He tolerated the procedure well.  HEMODYNAMIC DATA:  Aorta postcontrast 130/66, LV postcontrast 130/5-19.  ANGIOGRAPHIC DATA:  Left ventriculogram:  Performed in the 30-degree RAO projection.  The aortic valve is normal.  The mitral valve appeared normal.  There is no mitral regurgitation noted.  Left ventricle is normal in size.  Estimated ejection fraction is 60%.  Coronary arteries: Arise and distribute normally.  There was very mild  coronary calcification noted.  The left main coronary artery appears normal.  The left anterior descending is occluded after the second diagonal branch and fills by patent mammary graft.  A first diagonal branch has mild-to- moderate disease.  The second diagonal branch has a segmental 60-70% stenosis, is not amenable to percutaneous angioplasty.  The intermediate branch has mild 30-40% stenosis and the continuation branch of the circumflex with a first marginal has a 30-40% stenosis.  The right coronary is occluded in its midportion.  The distal vessel fills by means of patent right internal mammary graft.  The left internal mammary artery is widely patent with a patent distal anastomotic site, filling the left anterior descending.  The right internal mammary is not selectively injected but is patent with flow going into the distal vessel.  IMPRESSION: 1. Patent internal mammary artery grafts to the left anterior     descending coronary artery and the right coronary artery. 2. Disease involving the second diagonal branch not amenable to     percutaneous intervention.  The intermediate first diagonal and     first marginal branch only have mild disease noted and appeared     unchanged since 2009.  RECOMMENDATIONS:  Continuation of medical therapy, evaluation for other sources of pain.  Georga Hacking, M.D.     WST/MEDQ  D:  09/20/2010  T:  09/21/2010  Job:  [id number]  cc:   Windle Guard, M.D.  Electronically Signed by Lacretia Nicks. Donnie Aho M.D. on 09/23/2010 09:33:40 AM

## 2010-10-13 ENCOUNTER — Encounter: Payer: Self-pay | Admitting: Internal Medicine

## 2010-10-13 ENCOUNTER — Ambulatory Visit (INDEPENDENT_AMBULATORY_CARE_PROVIDER_SITE_OTHER): Payer: Self-pay | Admitting: Internal Medicine

## 2010-10-13 DIAGNOSIS — T6391XA Toxic effect of contact with unspecified venomous animal, accidental (unintentional), initial encounter: Secondary | ICD-10-CM

## 2010-10-13 DIAGNOSIS — J45909 Unspecified asthma, uncomplicated: Secondary | ICD-10-CM

## 2010-10-13 DIAGNOSIS — J309 Allergic rhinitis, unspecified: Secondary | ICD-10-CM

## 2010-10-13 DIAGNOSIS — J40 Bronchitis, not specified as acute or chronic: Secondary | ICD-10-CM

## 2010-10-19 NOTE — Assessment & Plan Note (Signed)
Summary: ROV//SH   CC:  follow up visit-asthma and allergies-constant wheezing-worse at times..  History of Present Illness: [name] 55, 2011- Asthma, allergic rhinitis...........................Marland Kitchenwife here Acute visit - sicker last 6 days with dry cough. Clear mucus from nose with sore throat, chills, sweats. Feels much worse than when here last visit. Wife hears wheeze. Denies nausea, vomiting, nodes, purulent, rash, blood. No chest pain except ribs sore from coughing.  October 55, 2011  Asthma, allergic rhinitis...........................Marland Kitchenwife here Nurse CC: 6 month follow up visit-asthma. Nasal stuffiness but no recent wheeze or chest tightness. Uses Xopenex once a week.  They ask what meds can be used off their list for better prices.  Discussed flu and pneumovax.   March  55, 2012-  Asthma, allergic rhinitis...........................Marland Kitchenwife here Nurse-CC:  follow up visit-asthma and allergies-constant wheezing-worse at times.Persitent nasal stuffiness. Notes more wheeze especially in last month and especially when lying down. No change in routine dyspnea with exertion. Doesn't feel sick. It doesn't keep him awake. Rhinocort nasal spray has been a big help. Uses Proventil and neb albuterol each about twice daily- more habit than need.  Hosp x 4 days cath for chest pain, R/O'd for MI at [facility]. Can't afford co-pay for PCP- i will have them talk with our [facility].      Preventive Screening-Counseling & Management  Alcohol-Tobacco     Smoking Status: quit  Current Medications (verified): 1)  Toprol Xl 50 Mg  Tb24 (Metoprolol Succinate) .... Take 1 Tablet By Mouth Once A Day 2)  Bayer Aspirin 325 Mg  Tabs (Aspirin) .... Take 1 By Mouth Once Daily 3)  Trazodone Hcl 100 Mg  Tabs (Trazodone Hcl) .... At Bedtime 4)  Nexium 40 Mg  Cpdr (Esomeprazole Magnesium) .Marland Kitchen.. 1 Daily 5)  Nitroglycerin 0.4 Mg  Subl (Nitroglycerin) .... Use As Needed 6)  Vytorin 10-10 Mg Tabs (Ezetimibe-Simvastatin) .... Take 1 By  Mouth Once Daily 7)  Epipen 0.3 Mg/0.20ml Devi (Epinephrine) .... For Severe Allergic Reaction 8)  Ipratropium-Albuterol 0.5-2.5 (3) Mg/56ml  Soln (Ipratropium-Albuterol) .Marland Kitchen.. 1 Neb Four Times A Day As Needed 9)  Sm Loratadine 10 Mg  Tabs (Loratadine) .... Take 1 By Mouth Once Daily 10)  Optivar 0.05 % Soln (Azelastine Hcl) .Marland Kitchen.. 1 Drop Each Eye Two Times A Day As Needed 11)  Symbicort 160-4.5 Mcg/act Aero (Budesonide-Formoterol Fumarate) .... 2 Puffs and Rinse Mouth, Twice Daily 12)  Rhinocort Aqua 32 Mcg/act Susp (Budesonide) .... 2 Sprays Each Nostril Once Every Day At Bedtime 13)  Proventil Hfa 108 (90 Base) Mcg/act Aers (Albuterol Sulfate) .... 2 Puffs Four Times A Day As Needed 14)  Magnesium Oxide 400 Mg Tabs (Magnesium Oxide) .... Take 2  By Mouth Once Daily  Allergies (verified): 1)  ! Penicillin  Vital Signs:  Patient profile:   55 year old male Height:      68 inches Weight:      212.38 pounds BMI:     32.41 O2 Sat:      97 % on Room air Pulse rate:   59 / minute BP sitting:   102 / 76  (left arm) Cuff size:   large  Vitals Entered By: Reynaldo Minium CMA (October 13, 2010 9:17 AM)  O2 Flow:  Room air CC: follow up visit-asthma and allergies-constant wheezing-worse at times.   Physical Exam  Additional Exam:  General: A/Ox3; pleasant and cooperative, NAD, obese,  SKIN: no rash, lesions NODES: no lymphadenopathy HEENT: [place]/AT, EOM- WNL, Conjuctivae- clear, PERRLA, TM-WNL, Nose- sclear, Throat-  clear and wnl, Mallampati II NECK: Supple w/ fair ROM, JVD- none, normal carotid impulses w/o bruits Thyroid-  CHEST:, coarse unlabored breath sounds , but no cough, unlabored.  ABDOMEN: soft EAV:WUJW, nl pulses, no edema  NEURO: Grossly intact to observation      Impression & Recommendations:  Problem # 1:  ASTHMA (ICD-493.90) :imited by finances. Will give sample trial Dulera 200 to compare with his Symbicort, but exam today isn't obviously worse than his baseline.  Exacerbation by hx.   Problem # 2:  ALLERGIC RHINITIS (ICD-477.9)  To continue Rhinocort. Use otc antihistamine if needed.  The following medications were removed from the medication list:    Astepro 0.15 % Soln (Azelastine hcl) .Marland Kitchen... 1-2 sprays each nostril twice daily as needed antihistamine His updated medication list for this problem includes:    Sm Loratadine 10 Mg Tabs (Loratadine) .Marland Kitchen... Take 1 by mouth once daily    Rhinocort Aqua 32 Mcg/act Susp (Budesonide) .Marland Kitchen... 2 sprays each nostril once every day at bedtime  Orders: Est. Patient Level III (11914)  Problem # 3:  Hx of INSECT BITE, VENOMOUS (ICD-989.5)  Asks sampale Epipen- can't afford.   Orders: Est. Patient Level III (78295)  Medications Added to Medication List This Visit: 1)  Proventil Hfa 108 (90 Base) Mcg/act Aers (Albuterol sulfate) .... 2 puffs four times a day as needed 2)  Magnesium Oxide 400 Mg Tabs (Magnesium oxide) .... Take 2  by mouth once daily  Other Orders: Primary Care Referral (Primary)  Patient Instructions: 1)  Please schedule a follow-up appointment in 6 months. 2)  Sample Epipen if available 3)  Sample Dulera 200-5 to try as comparison with Symbicort. Use it instead of Symbicort till it runs out, then go back to Symbicort. 4)    2 puffs and rinse mouth, twice daily. Notice if it helps the wheeze at night.  5)  Med refills.  6)  See [facility] about referral to Primary Care Prescriptions: SM LORATADINE 10 MG  TABS (LORATADINE) take 1 by mouth once daily  #90 x prn   Entered and Authorized by:   Waymon Budge MD   Signed by:   Waymon Budge MD on 10/13/2010   Method used:   Print then Give to Patient   RxID:   [id number] TRAZODONE HCL 100 MG  TABS (TRAZODONE HCL) at bedtime  #90 x 3   Entered and Authorized by:   Waymon Budge MD   Signed by:   Waymon Budge MD on 10/13/2010   Method used:   Print then Give to Patient   RxID:   [id number] PROVENTIL HFA 108 (90 BASE) MCG/ACT  AERS (ALBUTEROL SULFATE) 2 puffs four times a day as needed  #3 x 3   Entered and Authorized by:   Waymon Budge MD   Signed by:   Waymon Budge MD on 10/13/2010   Method used:   Print then Give to Patient   RxID:   [id number] RHINOCORT AQUA 32 MCG/ACT SUSP (BUDESONIDE) 2 sprays each nostril once every day at bedtime  #3 x 3   Entered and Authorized by:   Waymon Budge MD   Signed by:   Waymon Budge MD on 10/13/2010   Method used:   Print then Give to Patient   RxID:   [id number] SYMBICORT 160-4.5 MCG/ACT AERO (BUDESONIDE-FORMOTEROL FUMARATE) 2 puffs and rinse mouth, twice daily  #3 x 3   Entered and Authorized by:   Joni Fears  Magda Kiel MD   Signed by:   Waymon Budge MD on 10/13/2010   Method used:   Print then Give to Patient   RxID:   [id number]

## 2010-11-10 LAB — DIFFERENTIAL
Basophils Absolute: 0.1 10*3/uL (ref 0.0–0.1)
Basophils Relative: 1 % (ref 0–1)
Eosinophils Absolute: 0.1 10*3/uL (ref 0.0–0.7)
Eosinophils Relative: 2 % (ref 0–5)
Lymphocytes Relative: 30 % (ref 12–46)
Lymphs Abs: 1.8 10*3/uL (ref 0.7–4.0)
Monocytes Absolute: 0.2 10*3/uL (ref 0.1–1.0)
Monocytes Relative: 3 % (ref 3–12)
Neutro Abs: 3.9 10*3/uL (ref 1.7–7.7)
Neutrophils Relative %: 64 % (ref 43–77)

## 2010-11-10 LAB — POCT CARDIAC MARKERS
CKMB, poc: <1 ng/mL — ABNORMAL LOW (ref 1.0–8.0)
Myoglobin, poc: 66.2 ng/mL (ref 12–200)
Troponin i, poc: <0.05 ng/mL (ref 0.00–0.09)

## 2010-11-10 LAB — POCT I-STAT, CHEM 8
BUN: 23 mg/dL (ref 6–23)
Calcium, Ion: 1.13 mmol/L (ref 1.12–1.32)
Chloride: 104 mEq/L (ref 96–112)
Creatinine, Ser: 1 mg/dL (ref 0.4–1.5)
Glucose, Bld: 149 mg/dL — ABNORMAL HIGH (ref 70–99)
HCT: 42 % (ref 39.0–52.0)
Hemoglobin: 14.3 g/dL (ref 13.0–17.0)
Potassium: 3.5 mEq/L (ref 3.5–5.1)
Sodium: 139 mEq/L (ref 135–145)
TCO2: 23 mmol/L (ref 0–100)

## 2010-11-10 LAB — CBC
HCT: 41 % (ref 39.0–52.0)
Hemoglobin: 14.4 g/dL (ref 13.0–17.0)
MCHC: 35.1 g/dL (ref 30.0–36.0)
MCV: 86.7 fL (ref 78.0–100.0)
Platelets: 191 10*3/uL (ref 150–400)
RBC: 4.73 MIL/uL (ref 4.22–5.81)
RDW: 13.3 % (ref 11.5–15.5)
WBC: 6.1 10*3/uL (ref 4.0–10.5)
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
## 2010-12-21 NOTE — Cardiovascular Report (Signed)
NAMEJIAIRE, ROSEBROOK                   ACCOUNT NO.:  [id number]   MEDICAL RECORD NO.:  [id number]          PATIENT TYPE:  INP   LOCATION:  6526                         FACILITY:  [facility]   PHYSICIAN:  Georga Hacking, M.D.DATE OF BIRTH:  [birth year]   DATE OF PROCEDURE:  01/23/2008  DATE OF DISCHARGE:                            CARDIAC CATHETERIZATION   HISTORY:  The patient is a 55 year old male who has coronary artery  disease with previous bypass grafting.  He has had multiple  catheterizations previously and has had a previous known occlusion of  all of his vein grafts with patent right internal mammary and left  internal mammary arteries.  He presented with prolonged chest discomfort  lasting 45 minutes suggestive of ischemia with negative enzymes.   PROCEDURE:  Left heart catheterization with coronary angiograms, left  ventriculogram, angiograms of the internal mammary arteries and vein  graft.   COMMENTS ABOUT PROCEDURE:  The patient was brought to the cath lab and  was prepped and draped in the usual manner.  He was given 4 mg of Versed  for sedation.  After Xylocaine anesthesia, a 6-French sheath was placed  in right femoral artery.  The mammary arteries were selected using  mammary artery catheters and a guidewire through the subclavians.  The  right coronary and the left coronary arteries were selected using  appropriate catheters.  Following the procedure, the sheaths were  removed in the holding area.   HEMODYNAMIC DATA:  Aorta postcontrast 99/56 and LV postcontrast 99/0-15.   ANGIOGRAPHIC DATA:  Left ventriculogram:  Performed in the 30-degree RAO  projection.  The aortic valve is normal.  The mitral valve is normal.  The left ventricle appears normal in size.  The estimated ejection  fraction is 65%.  The saphenous vein graft coming off the aortic root  are noted to be occluded on the aortic root follow-through from the  injection.  Coronary arteries arise and  distribute normally.  Coronary  calcification is noted.  The left main coronary artery is normal.  The  left anterior descending is occluded proximally after 2 diagonal  branches.  The first diagonal branch has mild irregularity.  The second  diagonal branch has a segmental 80-90% stenosis and is tented up  somewhat in the midportion.  The LAD is occluded following this.  The  circumflex coronary artery has a single marginal branch and has a 30-40%  midvessel stenosis.  The intermediate branch contains no significant  disease.  The right coronary artery is occluded in the midportion.  A  right ventricular branch has a severe 99% stenosis in its midportion.  There is a distal stent present in the right coronary artery which is  subtotally occluded with very faint antegrade flow.   The previous saphenous vein grafts to the diagonal, obtuse marginal,  diagonal one, and intermediate branch were occluded.  The left internal  mammary to the LAD is widely patent.  There is good distal runoff and no  distal anastomotic stenosis.  The right internal mammary artery to the  distal posterior descending artery is  widely patent with patent distal  anastomotic site.  It fills the distal right coronary artery well.   IMPRESSION:  1. Significant native 3-vessel coronary artery disease with occlusion      of the LAD and the right coronary artery.  There is only mild      disease involving the circumflex, minimal disease involving the      intermediate branch and first diagonal.  There is potential site of      ischemia in the second diagonal which does not appear amenable to      percutaneous therapy.  2. Occlusion of the saphenous vein graft to the diagonal one, diagonal      two, intermediate, and obtuse marginal      arteries.  3. Patent internal mammary grafts to the distal posterior descending      and LAD.   RECOMMENDATIONS:  Evaluation for other sources of pain.  Continuation of  medical  treatment.      Georga Hacking, M.D.  Electronically Signed     WST/MEDQ  D:  01/23/2008  T:  01/24/2008  Job:  [id number]   cc:   Lianne Bushy, M.D.  Windle Guard, M.D.

## 2010-12-21 NOTE — Discharge Summary (Signed)
[name], [name]                   ACCOUNT NO.:  [id number]   MEDICAL RECORD NO.:  [id number]          PATIENT TYPE:  INP   LOCATION:  6526                         FACILITY:  [facility]   PHYSICIAN:  Georga Hacking, M.D.DATE OF BIRTH:  31-May-1956   DATE OF ADMISSION:  01/22/2008  DATE OF DISCHARGE:  01/24/2008                               DISCHARGE SUMMARY   FINAL DIAGNOSES:  1. Prolonged chest discomfort of undetermined etiology-myocardial      infarction ruled out.  Catheterization showing normal left      ventricle function, patent internal mammary graft to the right      coronary artery and to the left anterior descending, and      significant stenosis involving the circumflex marginal,      intermediate and first diagonal with segmental stenosis and a small      diagonal not amendable to percutaneous therapy.  2. Asthma.  3. Anxiety.  4. Hypertension.  5. Hyperlipidemia.   PROCEDURES:  Cardiac catheterization.   HISTORY:  The patient has a prior history of coronary artery disease  with bypass grafting in 2005.  He had occluded his vein grafts but his  mammary grafts were patent in 2006.  He presented to the hospital with  prolonged substernal chest discomfort, described as pressure and  heaviness lasting 45 minutes associated with some diaphoresis.  EKG was  unremarkable and he was admitted to rule out a myocardial infarction.  Additional history was that he had been kicked in the chest by a cow the  week before and had a superficial hematoma of his skin on the left chest  area.  Please see previously dictated history and physical for remainder  of the details.   HOSPITAL COURSE:  The patient was admitted and started on heparin and  nitroglycerin and was pain-free.  Laboratory data showed negative  cardiac enzymes.  Cholesterol was 211 with triglycerides of 213, HDL of  42, and LDL of 128.  B-natriuretic peptide was less than 30.  Sodium was  136, potassium 3.7, chloride  103, glucose 94, BUN 18, and creatinine of  1.1.  Liver enzymes were normal.  Hemoglobin A1c is 5.7 and mean plasma  glucose is 126.  He was placed on heparin and IV nitroglycerin.  He had  some bruise involving his left chest area.  EKG showed sinus bradycardia  but was otherwise normal.  Portable chest x-ray on admission showed a  previous median sternotomy, no pleural effusion or pneumothorax.  There  was minimal left base scar or atelectasis.  Mild cardiomegaly was noted.  The patient was taken to the catheterization laboratory the afternoon of  January 23, 2008.  He underwent cardiac catheterization which showed normal  left ventricular function.  Left main coronary artery was normal.  The  left anterior descending was occluded after 2 diagonal branches, the  first diagonal branch had minimal disease, second diagonal branch had a  segmental 70-80% stenosis but was not amenable for percutaneous therapy.  The intermediate branch was free of disease.  The circumflex marginal  branch had  30-40% midvessel stenosis.  The right coronary artery was  occluded in the midportion which was a new finding from a previous  admission.  Also, the first acute marginal branch had a subtotal  stenosis involving the right coronary artery.  The internal mammary  graft to the LAD was widely patent.  The internal mammary graft to the  right coronary artery was widely patent.  The vein graft to the diagonal  I, diagonal II, and sequential vein graft to the intermediate OM branch  were all occluded as was done previously in 2006.  He was ambulatory in  the hall, had no recurrence of chest discomfort and was discharged home  the next day.  Catheterization site was free of hematoma.  He has had  significant situational stress recently and [place] have contributed some to  his symptoms.   CURRENT MEDICATIONS:  1. Crestor 40 mg daily.  2. Isosorbide 60 mg daily.  3. Nitroglycerin 0.4 sublingual p.r.n.  4. Metoprolol  50 mg daily.  5. Aspirin 325 mg daily.  6. Claritin 10 mg daily.  7. Albuterol nebulizer as needed.  8. Foradil daily.  9. Protonix every day.  10.Trazodone 100 mg nightly.  11.EpiPen as needed for bee stings.   He is instructed to lose weight, follow a heart healthy diet and is to  call for an appointment in 1 week.  His is to bring his medicines with  him when he comes.      Georga Hacking, M.D.  Electronically Signed     WST/MEDQ  D:  01/24/2008  T:  01/24/2008  Job:  [id number]   cc:   Lianne Bushy, M.D.
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
## 2010-12-24 NOTE — Cardiovascular Report (Signed)
NAME:  [name], [name]                             ACCOUNT NO.:  [id number]   MEDICAL RECORD NO.:  [id number]                   PATIENT TYPE:  INP   LOCATION:  2001                                 FACILITY:  [facility]   PHYSICIAN:  W. [name]., M.D.         DATE OF BIRTH:  10/13/1955   DATE OF PROCEDURE:  10/06/2003  DATE OF DISCHARGE:                              CARDIAC CATHETERIZATION   HISTORY:  A 55 year old male who had recent successful bypass done who  presented with prolonged chest pain with abnormal enzymes.   PROCEDURE:  Left heart catheterization with coronary angiograms, left  ventriculogram, and bypass angiograms.   COMMENTS ABOUT PROCEDURE:  The patient tolerated the procedure well without  complications.  The vein grafts were selected using the standard right  coronary catheter, as was the internal mammary artery catheter.  We  attempted to get a catheter to pass into the right subclavian with great  difficulty, but were unable to do so and wound up inflating a blood pressure  cuff and doing a flush injection demonstrating the right internal mammary  graft to be patent.  He tolerated the procedure well.   HEMODYNAMIC DATA:  Aorta post contrast 114/63, LV post contrast 114/0-17.   ANGIOGRAPHIC DATA:  LEFT VENTRICULOGRAM:  Performed in the 30-degree RAO  projection.  The aortic valve was normal.  The mitral valve is normal.  The  left ventricle appears normal in size.  The estimated ejection fraction is  estimated at 65%.  Coronary arteries rise and distribute normally.  Left  main coronary artery is normal.   LEFT ANTERIOR DESCENDING:  The diagonal branches are occluded.  The LAD has  very faint anterograde flow, but mostly flows by the internal mammary artery  graft.  The second diagonal branch is patent and has a segmental 70%  proximal narrowing.  The previous vein graft insertion site is tented up and  there appears to be some slight blush of contrast around  the insertion site  noted.  There is a stenosis just distal to the insertion site and the vessel  is only small in size.  The circumflex coronary artery has an intermediate  branch and an obtuse marginal artery with bidirectional flow but fills  mainly by means of the vein graft.  The right coronary artery has a  significant 80% narrowing in the mid vessel.  The distal stented sites are  widely patent.  There is bidirectional flow seen into posterior descending  artery.   Saphenous vein graft to obtuse marginal and intermediate branch is widely  patent with both patent proximal and distal anastomotic sites.  The  saphenous vein graft to the diagonal 1 branch is widely patent with only  minor irregularity.  There is good wash out distal.  Saphenous vein graft to  the second diagonal branch is occluded.  The internal mammary graft to the  distal LAD is  patent.  There appears to be a mild distal anastomotic  stenosis, but it appears to be widely patent otherwise.  The right internal  mammary graft to the posterior descending artery is noted to be patent on a  flush injection.  It was not selectively injected.   IMPRESSION:  1. Early closure of the vein graft to the diagonal branch with some features     that are unfavorable for intervention of the diagonal.  2. Patency of the internal mammary graft to the LAD, the right internal     mammary graft to the PDA, the vein graft     to the OM1 and intermediate, and the vein graft to the diagonal 1.  3. Normal LV function.   RECOMMENDATIONS:  Continue medical therapy.                                               Darden Palmer., M.D.    WST/MEDQ  D:  10/06/2003  T:  10/07/2003  Job:  [id number]   cc:   Lianne Bushy, M.D.  [address]  [place]  [place] [address]  Fax: [phone]   Genene Churn. Barry Dienes, P.A.

## 2010-12-24 NOTE — Assessment & Plan Note (Signed)
[facility]                               PULMONARY OFFICE NOTE   NAME:[name], [name]                          MRN:          [id number]  DATE:03/24/2006                            DOB:          1956/03/28    PROBLEM LIST:  1. Asthma.  2. History of insect sensitivity.  3. Esophageal reflux.  4. Allergic rhinitis.  5. Coronary artery disease/angioplasty/stent.  6. Dyslipidemia.   HISTORY:  He and his wife say he has been stable until the last week or so  when he has begun having some increasing nasal congestion and post nasal  drainage he describes as thick.  He has not had sore throat or fever.  He  tries to avoid the hot weather outside which he says is difficult for him to  tolerate.  He has not had significant chest tightness or acute events.  There had been no insect sting episodes in a very long time.  He is no  longer employed.   MEDICATIONS:  1. Imdur 60 mg.  2. Foltx.  3. Plavix 75 mg.  4. Toprol XL 50 mg.  5. Zetia 10 mg.  6. Caduet 5/80.  7. Aspirin.  8. Trazodone 100 mg.  9. Zyrtec.  10.Advair 250/50.  11.Protonix.  12.Nitroglycerin.  13.Xopenex HFA rescue inhaler, 2 puffs q.i.d. p.r.n.   ALLERGIES:  Drug intolerant to PENICILLIN.   OBJECTIVE:  VITAL SIGNS:  Weight 204 pounds.  Blood pressure 112/60.  Pulse  regular 56.  Room air saturation 95%.  GENERAL:  Rather dramatic snorting and hacking.  HEENT:  There is some clear post nasal drainage and mild turbinate edema.  Conjunctivae are not injected.  CHEST:  Quiet and clear with no wheeze, rales or rhonchi.  CARDIO:  Heart sounds are regular without murmur or gallop.   IMPRESSION:  1. Rhinitis probably allergic, although I cannot completely exclude mild      viral episode.  2. Mild intermittent asthma is stable and controlled.  3. Venom sensitivity has been an inactive issue.   PLAN:  1. Because of insurance requirements we are changing his Advair to Foradil      1  b.i.d. add Asmanex 1 puff b.i.d. with discussion.  2. Saline nasal irrigation.  3. Trestolone once each nostril b.i.d. as needed, as discussed.  4. Schedule a return in six months, earlier as needed.                                   Clinton D. Maple Hudson, MD, [facility], FACP   CDY/MedQ  DD:  03/25/2006  DT:  03/25/2006  Job #:  [id number]   cc:   Lianne Bushy, MD  Georga Hacking, MD

## 2010-12-24 NOTE — Cardiovascular Report (Signed)
[name]. [facility]  Patient:    [name], [name]                          MRN: [id number] Proc. Date: 03/13/01 Adm. Date:  [id number] Disc. Date: [id number] Attending:  Norman Clay CC:         Maricela Bo, M.D.  Clinton D. Maple Hudson, M.D.   Cardiac Catheterization  PROCEDURE:  Cardiac catheterization and angioplasty.  INDICATIONS:  A 55 year old male with previous stenting of the distal right coronary artery who presented with unstable angina.  This study is done to exclude in-stent restenosis.  CARDIOLOGIST:  Darden Palmer., M.D.  COMMENTS ABOUT PROCEDURE:  The patient was brought to the catheterization lab and was prepped and draped in the usual manner.  After Xylocaine anesthesia, A 6-French sheath was placed in the right femoral artery percutaneously.  The patient was observed to have angina on the table prior to the start of the procedure rated at 6/10.  Angiograms were made using 6-French catheters, and a 30 cc ventriculogram was performed.  Arrangements were then made to do angioplasty of the right coronary artery because of in-stent restenosis.  I inquired about the availability of brachytherapy.  I was informed that no physicists were available, and brachytherapy was unavailable today.  Because of the ongoing chest pain and unstable clinical syndrome, it was elected to go ahead with cutting balloon angioplasty of the in-stent restenosis at this time due to ongoing symptoms despite medical therapy.  The sheath was exchanged for a 7-French sheath.  Angioplasty equipment uses a 7-French JR4 guiding catheter, HTF-J guidewire which crossed the lesion easily.  A 3.25 x 15 mm cutting balloon was passed across the stenosis and as dilated to 6 atmospheres for a total of 6 inflation.  Heparin 6000 units had been administered achieving ACT of 308, and Integrilin was begun using bolus and constant infusion.  He had some bradycardia  following balloon deflation on the initial three inflations.  An excellent angiographic result was achieved, and he was returned to the angioplasty care center in stable condition.  HEMODYNAMIC DATA:  Aorta post contrast 120/75, LV post contrast 120/10.  ANGIOGRAPHIC DATA:  Left ventriculogram:  Performed in the 30 degree RAO projection.  The aortic valve was normal.  The mitral valve was normal.  The left ventricle appears normal in size.  The estimated ejection fraction was 60%.  Coronary arteries arise and distribute normally.  Calcification noted in the proximal right coronary artery as well as the proximal left coronary system.  The left main coronary artery appears normal.  The left anterior descending contains 30 to 40% proximal narrowing.  A first diagonal branch has a severe 80% stenosis and is a small vessel of less than 1.5 mm.  The circumflex marginal branch contains 40 to 50% stenosis.  The right coronary artery is calcified with mild to moderate diffuse disease proximally.  Within the stented segment is an area of segmental eccentric 90% in-stent restenosis with 70% right at the ostium of the stent.  The second stent that was placed appears to be widely expanded, and the origin of the posterior descending, which is also dilated, is widely patent.  Post dilatation angiograms of the right coronary artery stented segment revealed 0% residual stenosis.  IMPRESSION: 1. Successful percutaneous transluminal coronary angioplasty with a cutting    balloon of the in-stent restenosis of the right coronary artery with  stenosis going from 90% to 0%. 2. Mild to moderate coronary disease involving the other vessels. 3. Normal left ventricular function.DD:  03/13/01 TD:  03/14/01 Job: [id number]

## 2010-12-24 NOTE — Discharge Summary (Signed)
NAMEMARGARITA, [name]                   ACCOUNT NO.:  [id number]   MEDICAL RECORD NO.:  [id number]          PATIENT TYPE:  INP   LOCATION:  2034                         FACILITY:  [facility]   PHYSICIAN:  Darden Palmer., M.D.DATE OF BIRTH:  January 31, 1956   DATE OF ADMISSION:  06/03/2004  DATE OF DISCHARGE:  06/04/2004                                 DISCHARGE SUMMARY   FINAL DIAGNOSES:  1.  Chest pain of undetermined etiology, myocardial infarction ruled out.  2.  Coronary artery disease with previous bypass grafting and previous early      closure of a graft to the second diagonal branch.  3.  Asthma.  4.  Reflux.  5.  Dyslipidemia.  6.  Anxiety.   HISTORY OF PRESENT ILLNESS:  This 55 year old male who has previous bypass  grafting was readmitted to the hospital with prolonged chest discomfort in  February and found to have occlusion of the second diagonal graft.  He had  done well since that time, but presented to the hospital with a 2-3 day  history of chest discomfort with some atypical features.  It was a vague  chest pressure that was not associated with arm discomfort like previous.  He had some mild dizziness, described as vertigo.  Took aspirin and  nitroglycerin and was transported here with symptoms having resolved.  Please see the previously dictated History and Physical for remainder of the  details.   HOSPITAL COURSE:  His symptoms gradually improved on nitroglycerin paste.  X-  ray showed mild basilar atelectasis.  EKG was normal.  Laboratory data  showed negative cardiac enzymes, normal CBC and normal chemistry panel.  He  had no recurrence of pain, was started on Lovenox.  The next morning was  ambulatory and pain free, walking in the halls.  The pain sounded atypical  to me, and in view of the negative enzymes, he was discharged home to have  an outpatient Cardiolite test.   CONDITION ON DISCHARGE:  Improved.   DISCHARGE MEDICATIONS:  1.  Zetia 10 mg daily.  2.   Norvasc 5 mg daily.  3.  Plavix 75 mg daily.  4.  Foltx 1 mg daily.  5.  Isosorbide 60 mg daily.  6.  Lipitor 80 mg daily.  7.  Toprol XL 50 mg daily.  8.  Aspirin 325 mg daily.  9.  Nexium 40 mg in the morning and in the evening which is an increased      dose.  10. Zyrtec 10 mg daily.  11. Advair 50/250 one puff b.i.d.  12. Albuterol inhaler two puffs q.i.d. p.r.n.  13. Trazodone 100 mg daily.   FOLLOWUP:  He is to call the office for an appointment to have a Cardiolite  test done next week.      Kristine Royal   WST/MEDQ  D:  06/04/2004  T:  06/04/2004  Job:  [id number]
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
## 2010-12-24 NOTE — Cardiovascular Report (Signed)
[name], [name]                   ACCOUNT NO.:  [id number]   MEDICAL RECORD NO.:  [id number]          PATIENT TYPE:  INP   LOCATION:                               FACILITY:  [facility]   PHYSICIAN:  W. [name]., M.D.DATE OF BIRTH:  [birth year]   DATE OF PROCEDURE:  10/06/2004  DATE OF DISCHARGE:                              CARDIAC CATHETERIZATION   HISTORY:  A 55 year old male who has had previous bypass grafting and had  previous closure of a vein graft to the diagonal 2 branch previously.  He  presented with prolonged chest pain and had a myocardial infarction ruled  out.   PROCEDURE:  1.  Left heart catheterization with coronary angiograms and left      ventriculogram.  2.  Angiography of the right and left internal mammary graft and saphenous      vein angiography x3.   DESCRIPTION OF PROCEDURE:  The patient was brought to the catheterization  laboratory and was prepped and draped in the usual manner.  After Xylocaine  anesthesia a 6-French sheath was placed in the right femoral artery  percutaneously.  Angiograms were made using 6-French catheters of the left-  and-right coronary arteries.  The vein grafts were selected using the right  coronary catheter.  A left internal mammary catheter was required to select  the left internal mammary artery.  The right internal mammary artery was  selected with great difficulty requiring a guidewire to get into the  subclavian because of difficulty negotiating the carotid artery that  branched at a bend.  I was able to take 1 injection of the right internal  mammary with the internal mammary catheter.  Also following this, a 30 mL  ventriculogram was performed and he was returned to the holding area in  stable condition.   HEMODYNAMIC DATA:  Aorta postcontrast 117/62, LV postcontrast 117/4-17.   ANGIOGRAPHIC DATA.   LEFT VENTRICULOGRAM:  Performed in the 30-degree RAO projection.  The aortic  valve was normal.  The mitral valve  was normal.  The left ventricle appears  normal in size.  The estimated ejection fraction is 60%.  The coronary  arteries arise and distribute normally.  The left main coronary artery is  normal.   LEFT ANTERIOR DESCENDING:  A large vessel that arises proximally.  It gives  off 2 diagonal branches which were totally occluded.  The distal vessel  fills through a patent internal mammary graft.  The first diagonal branch is  at a site where the vessel is tented up, but does not contain any  significant stenosis.  The second diagonal branch has an area of 90%  narrowing prior to the origin of it.  This vessel is also tented up in the  midportion and is a somewhat small vessel.  The intermediate coronary  artery.  He also is tented up in the midportion of the of the vessel.  And  Hubbert have a slight narrowing at the site where it is tented up.  The marginal  artery arises and has 30% stenosis in the midportion  and probable 30-40%  proximal stenosis.  The right coronary artery is a large dominant vessel.  There is an eccentric, proximal 80-90% stenosis in the midportion of the  vessel.  The previous stented sites are patent.  There is flow going into  the previous internal mammary graft and the previous distal insertion site  is widely patent.   Internal mammary graft to the LAD is widely patent.  There is a mild 20-30%  distal anastomotic stenosis.  The distal LAD is widely patent.   Right internal mammographs to the distal right coronary artery is widely  patent.   IMPRESSION:  1.  Interval occlusion of the vein graft to the intermediate and marginal      branches and vein graft to the diagonal branch.  Previous known      occlusion of the vein graft to the second diagonal branch.  2.  Patent internal mammograph to the LAD and to the right coronary artery.  3.  Residual and bypass disease involving the 2 diagonal branches,      intermittent branch and OM branch, some of which appeared to  need      intervention because of the size.  4.  Normal left ventricular function.      WST/MEDQ  D:  10/06/2004  T:  10/06/2004  Job:  [id number]   cc:   Lianne Bushy, M.D.  [address]  [place]  [place] [address]  Fax: [phone]   W. [name]., M.D.  [address]  [place]  [place] [address]  Fax: [phone]
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
## 2010-12-24 NOTE — Cardiovascular Report (Signed)
[facility]  Patient:    [name], Evan                          MRN: [id number] Proc. Date: 10/06/00 Adm. Date:  [id number] Disc. Date: [id number] Attending:  Norman Clay CC:         Clinton D. Maple Hudson, M.D.  Maricela Bo, M.D.   Cardiac Catheterization  HISTORY:  A 55 year old male with a prior history of asthmatic bronchitis who has had progressive exertional chest discomfort with features suggestive of angina.  Risk factors are family history and hyperlipidemia.  COMMENTS ABOUT PROCEDURE:  The patient was brought to the catheterization lab and was prepped and draped in the usual manner.  After Xylocaine anesthesia, a 6 French sheath was placed in the right femoral artery percutaneously. Angiograms were made using 6 French catheters and a 30 cc ventriculogram was performed.  He was found to have a severe segmental stenosis involving the distal right coronary artery.  Arrangements were made to do angioplasty.  He was given 6400 units of heparin and Integrilin was begun with double bolus and infusion technique.  ACT was 310.  The sheath was exchanged for a 7 [place] sheath.  Angioplasty equipment used was a 7 [place] JR4 guiding catheter, HTF-J guidewire which crossed the lesion with only mild difficulty.  The lesion was pre-dilated with a 3.0 x 15 mm CrossSail balloon to 6 atmospheres. A 3.0 x 18 mm Penta stent was then deployed to 12 atmospheres.  There was found to be a disease proximal to this stent and a decision was made to place a 3.0 x 13 mm Penta stent which was dilated to 14 atmospheres.  The patient tolerated the procedure well during this period of time.  He was noted to have some compromise and narrowing of the posterior descending artery which did not reverse with intracoronary nitroglycerin.  The same guidewire was redirected down the posterior descending artery.  A 2.25 x 15 mm CrossSail balloon crossed this and was  dilated but would not hold pressure and was removed.  A second 2.25 x 20 mm CrossSail balloon was put across the lesion, this being 20 mm in length due to inability of the 15 and was dilated to 14 atmospheres this time holding pressure.  The patient had reproduction of pain during stent implantation and balloon inflations.  He was stable at the end of the procedure.  He had previously been given 150 mg of Plavix prior to start of the procedure.  He tolerated the procedure well.  HEMODYNAMIC DATA:  Aorta post contrast 109/.73, LV post contrast 109/13-15.  ANGIOGRAPHIC DATA:  LEFT VENTRICULOGRAM:  The left ventriculogram was performed in the 30 degree RAO projection.  The aortic valve was normal.  The mitral valve was normal. The left ventricle appears normal in size.  The estimated ejection fraction was 60%.  CORONARY ARTERIES:  Arise and distribute normally.  There is calcification noted in the proximal right coronary artery as well as the left coronary system.  The left main coronary artery appears normal.  The left anterior descending has a moderate segmental stenosis estimated at 40% proximally.  Two diagonal branches arise which are only small to moderate in size.  One diagonal has a significant ostial stenosis estimated at 90% and is a somewhat small vessel.  The second diagonal has 60-70% stenosis.  The distal vessel appears large.  The circumflex coronary artery  contains scattered irregularities.  The right coronary artery is a dominant vessel.  The posterior descending and two posterolateral branches arise.  There is a segment of diffuse disease in the right coronary artery prior to the PDA and going right up to the bifurcation of the PDA and the continuation branch.  It is 60% at the proximal portion and is 90% in the midportion and is irregular and eccentric.  It appears greater than 15 mm.  The distal vessel appears normal.  Post-dilatation angiogram show adequate  stent deployment of both stents with 0% residual stenosis.  There was a less than 20% stenosis in the posterior descending artery.  IMPRESSION: 1. Successful stenting of a complex segmental stenosis involving the    distal right coronary artery with stenosis going from 90% to 0%. 2. Residual disease involving two small diagonal branches which will be    treated medically. 3. Normal left ventricular function. DD:  10/06/00 TD:  10/09/00 Job: [id number]

## 2010-12-24 NOTE — Cardiovascular Report (Signed)
NAME:  [name], [name]                             ACCOUNT NO.:  [id number]   MEDICAL RECORD NO.:  [id number]                   PATIENT TYPE:  OBV   LOCATION:  6527                                 FACILITY:  [facility]   PHYSICIAN:  W. [name]., M.D.         DATE OF BIRTH:  1955-11-25   DATE OF PROCEDURE:  09/03/2003  DATE OF DISCHARGE:                              CARDIAC CATHETERIZATION   PROCEDURE PERFORMED:  Cardiac catheterization.   CARDIOLOGIST:  Georga Hacking, M.D.   HISTORY:  [age]-year-old male who has a prior history of coronary  artery disease with previous stenting of the distal right coronary artery.  He presented with an acute coronary syndrome with severe unstable angina and  recurrent ST elevation in the inferior leads with T wave changes noted in  V2, which are new.  He has stabilized on IV Integrilin as well as IV  nitroglycerin and he was brought to the cardiac catheterization laboratory.   COMMENTS ABOUT THE PROCEDURE:  The patient tolerated the procedure well  without complications.  At the end of the procedure a right femoral  angiogram was obtained and he underwent percutaneous suture mediated closure  with the Perclose ProGlide Device.  He tolerated the procedure well.   HEMODYNAMIC DATA:  Aorta post contrast 100/65.  LV post contrast 100/3-12.   ANGIOGRAPHIC DATA:  Left Ventriculogram:  The left ventriculogram was  performed in the 30-degree RAO projection.  The aortic valve was normal.  The mitral valve was normal.  The left ventricle appears normal in size.  Estimated ejection fraction was 60%.  Coronary arteries arise and distribute  normally.  There is calcification noted in the proximal left anterior  descending as well as the right coronary artery.   Left Main Coronary Artery:  The left main coronary artery is normal.   Left Anterior Descending:  The left anterior descending is calcified  proximally.  The first diagonal branch arises  and has a segmental 50-60%  stenosis in its midportion.  There is then calcification and an area of  severe 90% narrowing following a second diagonal branch, which is somewhat  segmental.  The second diagonal branch is a moderate-sized vessel and has a  long segmental 70-80% stenosis from the proximal to the midportion.   Circumflex Coronary Artery:  The circumflex coronary artery has a single  marginal branch and has a 50-60% midvessel stenosis.   Right Coronary Artery:  The right coronary artery is calcified in its  midportion and has an 80-90% somewhat ulcerated midvessel stenosis in the  previously placed stent, and the distal portion of the right coronary artery  is widely patent with only 40% in-stent stenosis.  The ostium of the  posterior descending is widely patent.   IMPRESSION:  1. Significant three-vessel coronary artery disease  2. Acute coronary syndrome with unstable angina.   RECOMMENDATIONS:  The patient underwent Perclose of the right  femoral  artery.  He will be maintained on Integrilin, restarted on Lovenox and  surgical consultation for possible coronary artery bypass grafting  particularly since the LAD and the bifurcation of the diagonal appear to be  somewhat unfavorable for percutaneous intervention.                                               Darden Palmer., M.D.    WST/MEDQ  D:  09/03/2003  T:  09/05/2003  Job:  [id number]   cc:   Lianne Bushy, M.D.  [address]  [place]  [place] [address]  Fax: [phone]

## 2010-12-24 NOTE — H&P (Signed)
[facility]  Patient:    [name], [name]                          MRN: [id number] Adm. Date:  [id number] Attending:  Norman Clay                         History and Physical  HISTORY OF PRESENT ILLNESS:  This 55 year old male was discharged yesterday just having been admitted with unstable angina and having had a PTCA of the right coronary artery end stent restenosis the day before.  He had done well since discharge but, this morning, developed a burning pain in his mid chest area.  The pain was described as a deep burning pain which he had never had before.  He is on Nexium for reflux and, also, recently started on Niaspan. The pain was described as a burning pain and he did not try nitroglycerin.  It was not similar to the previous anginal type pain that brought him to the hospital.  However, because he was having this pain, he was brought to the emergency room and we admitted him to rule out an MI.  Please see the previously dictated notes from the recent discharge yesterday for family history, social history, review of systems, and other details.  PHYSICAL EXAMINATION:  GENERAL:  He is a pleasant male who is currently in no acute distress.  VITAL SIGNS:  His blood pressure was 130/80.  LUNGS:  Clear.  HEART:  There was no S3.  Pulses were present.  IMPRESSION: 1. Atypical chest discomfort following previous angioplasty of in-stent    restenosis. 2. Coronary artery disease. 3. Hyperlipidemia under treatment. 4. Asthma. 5. Anxiety. 6. Osteoarthritis. 7. Allergies.  RECOMMENDATIONS:  Admit, check serial enzymes and electrocardiogram, and further workup depending on results of the above.  Increase Protonix to the higher dose. DD:  03/15/01 TD:  03/15/01 Job: [id number]

## 2010-12-24 NOTE — Discharge Summary (Signed)
[name], [name]                   ACCOUNT NO.:  [id number]   MEDICAL RECORD NO.:  [id number]          PATIENT TYPE:  INP   LOCATION:  4739                         FACILITY:  [facility]   PHYSICIAN:  Darden Palmer., M.D.DATE OF BIRTH:  [birth year]   DATE OF ADMISSION:  10/06/2004  DATE OF DISCHARGE:  10/07/2004                                 DISCHARGE SUMMARY   FINAL DIAGNOSES:  1.  Chest pain of undetermined etiology, myocardial infarction ruled out.  2.  Coronary artery disease.      1.  Previous bypass grafting with patent internal mammary graft to the          LAD and a patent right internal mammary graft to the right coronary          artery.      2.  Occlusion of all three saphenous vein grafts to the two diagonal          branches and the OM and intermediate branch.  3.  History of asthma.  4.  Hyperlipidemia under treatment.  5.  Abnormal glucose tolerance.  6.  History of depression.  7.  Obesity.  8.  History of reflux.   PROCEDURES:  1.  Cardiac catheterization.  2.  Gallbladder ultrasound.   HISTORY OF PRESENT ILLNESS:  This 55 year old male had bypass grafting in  February 2005 with mammary to the LAD, RIMA to the posterior descending  artery, vein graft to the diagonal I and diagonal II and a vein graft to the  ramus.  He had a myocardial infarction later in February due to occlusion of  the vein graft to the second diagonal.  He has been managed medically since  that time and has had one further admission for atypical chest pain, but had  a negative Cardiolite since then.  He was admitted with prolonged chest  discomfort that occurred after dinner.  Please see the previously dictated  History and Physical for remainder of the details.   HOSPITAL COURSE:  Laboratory data on admission showed an unremarkable chest  x-ray.  EKG was normal.  CBC was normal. Chemistry panel showed a glucose of  122 and a potassium of 3.4, but was otherwise normal.  Cholesterol was  209  with triglycerides of 104.  LDL cholesterol was 144.  CPK and troponins were  all negative.  EKG remained normal.   The patient was admitted and begun on heparin and IV nitroglycerin.  He was  taken to the cardiac catheterization laboratory the next day.  He was found  to have normal left ventricular function.  Internal mammary graft to the LAD  was patent.  The internal mammary graft to the posterior descending artery  was also patent.  There was an 80-90% stenosis on the right coronary artery.  In addition, the three previously placed vein grafts were occluded to the  first and second diagonal branches as well as their ramus and the OM branch.  Two of these grafts had been patent just one year previous.  There as the  unusual side of tenting  of the arteries where the grafts were sewn in, but  nothing was found that was suitable for angioplasty or stenting.  The  patient remained pain free and tolerated the procedure well and was  ambulatory in the hall the next day without recurrence of chest pain.  Gallbladder ultrasound was normal. It is opted to treat him medically at  this time, and he is discharged in improved condition.  He needs to have  strict lifestyle modification, particularly to loose weight.  He will need  to have investigation for abnormal glucose tolerance.   DISCHARGE MEDICATIONS:  1.  Aspirin 81 mg daily.  2.  Plavix 75 mg daily.  3.  Nexium 40 mg daily.  4.  Norvasc 5 mg daily.  5.  Isosorbide dinitrate 60 mg daily.  6.  Zetia 10 mg daily.  7.  Lipitor 80 mg daily.  8.  Zyrtec daily.  9.  Advair inhaler.  10. Trazodone 100 mg daily.  11. Toprol XL 50 mg daily.   DISCHARGE INSTRUCTIONS:  He is to call to get an appointment to see me in  approximately one week and is to call if there are recurrent problems.      WST/MEDQ  D:  10/07/2004  T:  10/07/2004  Job:  [id number]   cc:   Salvatore Decent. Cornelius Moras, M.D.  [address]  [place]  [place] [address]

## 2010-12-24 NOTE — Assessment & Plan Note (Signed)
[facility]                             PULMONARY OFFICE NOTE   NAME:[name], [name]                          MRN:          [id number]  DATE:11/20/2006                            DOB:          1955-11-15    PROBLEMS:  1. Asthma.  2. History of insect sting sensitivity.  3. Esophageal reflux.  4. Allergic rhinitis.  5. Coronary disease/angioplasty/stent.  6. Dyslipidemia.   HISTORY:  He blames pollen for his head congestion although symptoms are  getting more nearly perennial every year now. He is out of Foradil and  Asmanex. He would like to have a replacement home nebulizer which we  discussed.   MEDICATIONS:  1. Imdur 60 mg.  2. Foltx.  3. Toprol XL 50 mg.  4. Zetia 10 mg.  5. Aspirin.  6. Trazodone 100 mg.  7. Zyrtec.  8. Protonix.  9. Foradil 1 b.i.d.  10.Asmanex 1 b.i.d.  11.Crestor.  12.Nitroglycerin p.r.n.  13.Albuterol p.r.n.  14.Astelin.  Drug intolerant to PENICILLIN.   OBJECTIVE:  Weight 203 pounds, blood pressure 106/80, pulse regular 61,  room air saturation 95%. Snorting, sniffing, and rubbing his nose.  Breath sounds are diminished with trace expiratory wheeze.   IMPRESSION:  Allergic rhinitis and asthma/chronic obstructive pulmonary  disease with exacerbation, now in pollen season. We have discussed  allergy vaccine as a possibility if easy symptomatic control is not  obtainable.   PLAN:  Claritin, refill Foradil and Asmanex, home nebulizer machine to  use albuterol nebulizer solution daily p.r.n. Today he was given a nasal  Neo-Synephrine inhalation treatment with Depo-Medrol 80 mg i.m. Schedule  return 6 months, earlier p.r.n.     Clinton D. Maple Hudson, MD, Tonny Bollman, FACP  Electronically Signed    CDY/MedQ  DD: 11/25/2006  DT: 11/26/2006  Job #: [id number]   cc:   Lianne Bushy, M.D.  Georga Hacking, M.D.

## 2010-12-24 NOTE — Discharge Summary (Signed)
NAME:  [name], [name]                             ACCOUNT NO.:  [id number]   MEDICAL RECORD NO.:  [id number]                   PATIENT TYPE:  INP   LOCATION:  2001                                 FACILITY:  [facility]   PHYSICIAN:  Darden Palmer., M.D.         DATE OF BIRTH:  07/21/1956   DATE OF ADMISSION:  10/04/2003  DATE OF DISCHARGE:  10/07/2003                                 DISCHARGE SUMMARY   FINAL DIAGNOSIS:  1. Subendocardial myocardial infarction - initial episode.     a. Occlusion of the vein graft to the diagonal branch.     b. Patency of other vein grafts.  2. Coronary artery disease with recent bypass grafting for severe three     vessel disease.  3. Asthma.  4. Reflux.  5. Dyslipidemia.  6. Lumbar disk disease.   PROCEDURE:  Cardiac catheterization.   HISTORY:  A 55 year old male who had bypass grafting x6 one month ago he had  been doing well. The day of admission, after taking a walk, he came home and  had a choking and coughing fit and developed anterior substernal pain with  an aching quality that was radiated to both arms with heaviness. He was  diaphoretic and nauseous and took one nitroglycerin at home and EMS  transported him here at which time he received three additional  nitroglycerin without relief. He was placed on IV nitroglycerin drip, a GI  cocktail with Pepcid, had further improvement with his symptoms and was  having very mild bilateral arm pain. He was admitted to rule out a  myocardial infarction. Please see the previously dictated history and  physical for the remainder of the details.   HOSPITAL COURSE:  Lab data on admission shows a hemoglobin of 10.6,  hematocrit 30.9. Chemistry panel showed a glucose of 103, BUN 24, creatinine  of 0.9. CPK-MB was 7.2 and troponins were 0.59. All of the initial  myoglobins, MBs, and troponins were negative.   The patient was placed on Lovenox and IV nitroglycerin and was admitted to  the hospital. He  underwent cardiac catheterization on February 28 with  findings of normal left ventricular function. The native coronary arteries,  the LAD, and circumflex were occluded with bidirectional flow. The right  coronary artery was patent with a severe mid vessel stenosis and  bidirectional flow into the posterior descending artery. The internal  mammary graft to the LAD was widely patent. The internal mammary graft to  the right coronary artery was patent. The vein graft sequentially to the  intermediate and obtuse marginal artery was patent. The vein graft to the  first diagonal was patent and the vein graft to the second diagonal was  occluded. The vessel was somewhat small and diseased proximal and also  appeared to have a stenosis after the first side of the vein graft. In view  of this it was recommended that he be  treated medically with the addition of  Plavix to his regimen. He was ambulatory in  the hall that evening and the  next day without recurrence of pain and was discharged home in improved  condition. It was recommended that he enter rehab and that he be treated  medically for this early occlusion of the vein graft.   DISCHARGE MEDICATIONS:  1. Zetia 10 mg daily.  2. Norvasc 5 mg daily.  3. Plavix 75 mg daily.  4. Foltx 1 mg daily.  5. Isosorbide mononitrate 60 mg daily.  6. Lipitor 80 mg daily.  7. Toprol XL 50 mg daily.  8. Aspirin 81 mg daily.  9. Nexium 40 mg daily.  10.      Zyrtec 10 mg daily.  11.      Advair 50/250 one puff b.i.d.  12.      Albuterol 2 puffs q.i.d. p.r.n.  13.      Trazodone 100 mg daily.   DISCHARGE INSTRUCTIONS:  He is to follow up with me in one week. He is to  call if there is recurrent chest pain or problems.                                                Darden Palmer., M.D.    WST/MEDQ  D:  10/07/2003  T:  10/08/2003  Job:  [id number]   cc:   Lianne Bushy, M.D.  [address]  [place]  [place] [address]  Fax: [phone]    Salvatore Decent. Cornelius Moras, M.D.  [address]  [place]  [place] [address]
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
## 2010-12-24 NOTE — Cardiovascular Report (Signed)
[facility]  Patient:    [name], [name] Visit Number: [id number] MRN: [id number]          Service Type: MED Location: [facility]UB 2912 01 Attending Physician:  Norman Clay Dictated by:   Darden Palmer., M.D. Proc. Date: 09/04/01 Admit Date:  09/02/2001   CC:         Cardiac Catheterization Laboratory                        Cardiac Catheterization  INDICATIONS:  A 55 year old male with prior history of angioplasty and stenting of the distal right coronary artery, with restenosis there.  He presented to the hospital with unstable angina; please see the previously dictated history and physical for the remainder of the details.  COMMENTS ABOUT PROCEDURE:  The patient tolerated the procedure well.  Multiple views were taken of the right coronary artery as well as the left coronary system.  He had some chest pain during the procedure.  Intracoronary nitroglycerin was given during the procedure to rule out significant possible spasm.  He tolerated the procedure well and at the end of the procedure decision was made to stop the Integrilin and remove the sheath.  He had diffuse disease, but no focal culprit lesions were noted.  HEMODYNAMIC DATA: 1. Aortic post-contrast:  90/55. 2. Left ventricular post-contrast:  90/20.  ANGIOGRAPHIC DATA:  LEFT VENTRICULOGRAM:  Performed in the 30-degree RAO projection.  The aortic valve was normal.  The mitral valve was normal.  The left ventricle appears normal in size.  The estimated ejection fraction was 60%.  CORONARIES:  Coronary arteries arise and distribute normally.  Calcification seen, involving the left coronary system as well as the right coronary artery. 1. LEFT MAIN CORONARY ARTERY:  Appears normal. 2. LEFT ANTERIOR DESCENDING ARTERY:  Calcified in its mid portion.  There is a    50-70% stenosis in the mid LAD, which also involves the ostium of the    diagonal branch.  The diagonal  branch is a small vessel and has a severe    90% stenosis.  There are segmental 40% disease following this more severe    area in the LAD.  Another diagonal branch arises and has a mid vessel    40-50% stenosis. 3. CIRCUMFLEX CORONARY ARTERY:  Contains an intermediate branch, with mild    disease and a distal branch of 40% stenosis. 4. RIGHT CORONARY ARTERY:  Has diffuse disease in the mid portion, estimated    at 40-50%.  The stent is patent.  There is a mild to moderate in-stent    restenosis, which is focal, at the very proximal rim of the first stent    -- which is not felt to be severe or focally obstructive.  The twin    posterior descending branch is small and appears diffusely diseased.  There    Corso be some left-to-right collaterals noted.  IMPRESSION: 1. Normal left ventricular function. 2. Patency of the stented site to the right coronary artery, with a    mild to moderate in-stent restenosis proximally.  Diffuse disease    involving the proximal right mid LAD and diagonal branches; distal    small vessel disease involving the right coronary artery.  In addition,    there is a severe subtotal stenosis involving an RV branch.  RECOMMENDATIONS:  Continued medical therapy and search for other causes of pain.Dictated by:   Georga Hacking,  Montez Hageman., M.D. Attending Physician:  Norman Clay DD:  09/04/01 TD:  09/05/01 Job: [id number]

## 2010-12-24 NOTE — Op Note (Signed)
NAME:  [name], COMMON                             ACCOUNT NO.:  [id number]   MEDICAL RECORD NO.:  [id number]                   PATIENT TYPE:  INP   LOCATION:  2312                                 FACILITY:  [facility]   PHYSICIAN:  Salvatore Decent. Cornelius Moras, M.D.              DATE OF BIRTH:  [birth year]   DATE OF PROCEDURE:  09/09/2003  DATE OF DISCHARGE:                                 OPERATIVE REPORT   PREOPERATIVE DIAGNOSIS:  Three vessel coronary artery disease with class IV  unstable angina.   POSTOPERATIVE DIAGNOSIS:  Three vessel coronary artery disease with class IV  unstable angina.   PROCEDURE:  Median sternotomy for coronary artery bypass grafting x 6 (left  internal mammary artery to distal left anterior descending  coronary artery,  right internal mammary artery to posterior descending coronary artery,  saphenous vein graft to first diagonal branch, saphenous vein graft to  second diagonal branch, saphenous vein graft to ramus intermedius branch and  sequential saphenous vein graft to circumflex marginal branch, endoscopic  vein harvest from right thigh and right lower extremity).   SURGEON:  Salvatore Decent. Cornelius Moras, M.D.   ASSISTANT:  Salvatore Decent. Dorris Fetch, M.D.   SECOND ASSISTANT:  Coral Ceo, P.A.   ANESTHESIA:  General.   BRIEF CLINICAL NOTE:  The patient is a 55 year old white male with known  history of coronary artery disease, hypertension, hyperlipidemia, and  degenerative joint disease.  He presents with new onset symptoms of class IV  unstable angina.  He was admitted to the hospital by Dr. Viann Fish and  underwent cardiac catheterization demonstrating severe three vessel coronary  artery disease with preserved left ventricular  function.  A full  consultation note has been dictated previously.   OPERATIVE CONSENT:  The patient and his wife have been counseled at length  regarding the indications and potential benefits of coronary artery bypass  grafting.  Alternative  treatment strategies have been discussed in detail.  They understand and accept all the associated risks of surgery including but  not limited to the risks of death, stroke, myocardial infarction, congestive  heart failure, respiratory failure, pneumonia, bleeding requiring blood  transfusion, arrhythmia, infection, recurrent coronary artery disease.  All  their questions have been addressed.   OPERATIVE NOTE IN DETAIL:  The patient was brought to the operating room on  the above mentioned date and placed in a supine position on the operating  table.  Central monitoring is established by the anesthesia service under  the care and direction of Dr. Judie Petit.  Specifically, a Swan-Ganz  catheter was placed through the right internal jugular approach.  A radial  arterial line was placed.  Intravenous antibiotics are administered.  Following induction with general endotracheal anesthesia, a Foley catheter  was placed.  The patient's chest, abdomen, both groins, and both lower  extremities are prepped and draped in a sterile manner.  A  median sternotomy  incision was performed and the left internal mammary artery dissected from  the chest wall and prepared for bypass grafting.  The left internal mammary  artery was a good quality conduit.  Following this, the right internal  mammary artery was dissected from the chest wall and also prepared for  bypass grafting.  This vessel was also a good quality conduit.  Simultaneously, saphenous vein was obtained from the patient's right thigh  and the upper portion of the right lower leg using endoscopic vein harvest  technique.  The saphenous vein graft is a good quality conduit.  The patient  was heparinized systemically.   The pericardium was opened.  The ascending aorta is normal in appearance.  The ascending aorta and right atrium are cannulated for cardiopulmonary  bypass.  Adequate heparinization is verified.  Cardiopulmonary bypass was   begun and the surface of the heart is inspected.  Distal sites were selected  for coronary bypass grafting.  Portions of the saphenous vein and both  internal mammary arteries are trimmed to the appropriate length.  A  temperature probe was placed in the left ventricular septum.  A cardioplegia  catheter was placed in the ascending aorta.  The patient was cooled to 32  degrees systemic temperature.  The aortic Cross clamp was applied and  cardioplegia delivered in an antegrade fashion through the aortic root.  Ice  saline slush is applied for topical hypothermia.  The initial cardioplegia  arrest and myocardial cooling are felt to be excellent.  Repeat doses of  cardioplegia are administered intermittently throughout the crossclamp  portion of the operation, both through the aortic root and down the  subsequently placed vein grafts to maintain septal temperature below 15  degrees Centigrade.   The following distal coronary anastomoses are performed:   1. The ramus intermediate branch is grafted with a saphenous vein graft in a     side-to-side fashion.  This coronary measures 1.5 mm in diameter and is     of good quality.  2. The circumflex marginal branch is grafted using the sequential saphenous     vein graft off the vein placed to the ramus intermediate branch.  This     coronary measures 1.5 mm and is of fair to good quality.  3. The first diagonal branch off the left anterior descending coronary     artery is grafted with a saphenous vein graft in an end-to-side fashion.     This coronary measures 1.5 mm in diameter and is of good quality.  4. The second diagonal branch off the left anterior descending coronary     artery is grafted with a saphenous vein graft in an end-to-side fashion.     This coronary measures 1.5 mm in diameter and is of good quality. 5. The posterior descending coronary artery is grafted with the right     internal mammary artery in an end-to-side fashion.   This coronary     measures 1.5 mm in diameter and is of good quality at the site of distal     bypass.  The right internal mammary artery is utilized in situ for this     graft.  6. The distal left anterior descending coronary artery is grafted with the     left internal mammary artery in an end-to-side fashion. This coronary     measures 1.7 mm at the site of the distal bypass and is of good quality.   The septal temperature is  noted to rise rapidly upon reperfusion of the  internal mammary artery graft.  The aortic Cross clamp was removed after a  total crossclamp time of 79 minutes.  All three proximal saphenous vein  anastomoses are performed to the ascending aorta under separate partial  occlusion clamp.  All proximal and distal anastomoses are inspected for  hemostasis and appropriate graft orientation.  Epicardial pacing wires are  affixed to the right ventricular outflow tract and to the right atrial  appendage.  The patient is rewarmed to 37 degrees Centigrade temperature.  The patient is weaned from cardiopulmonary bypass without difficulty.  The  patient's rhythm at separation from bypass is normal sinus rhythm.  No  inotropic support is required.  Total cardiopulmonary bypass time for the  operation is 116 minutes.  The venous and arterial cannulae are removed  uneventfully.  Protamine is administered to reverse the anticoagulation.  The mediastinum and both pleural spaces are irrigated with saline solution  containing vancomycin.  Meticulous surgical hemostasis is ascertained.  The  mediastinum and both pleural spaces are drained with four chest tubes placed  through separate stab incisions inferiorly.  The median sternotomy incision  is closed in routine fashion.  The right lower extremity incision is closed  in multiple layers in routine fashion. All skin incisions are closed with  subcuticular skin closure.   The patient tolerated the procedure well and is transported to  the surgical  intensive care unit in stable condition.  There are no interoperative  complications.  All sponge and instrument counts were verified as correct at  the completion of the operation.  No blood products were administered.                                               Salvatore Decent. Cornelius Moras, M.D.    CHO/MEDQ  D:  09/09/2003  T:  09/09/2003  Job:  [id number]   cc:   W. [name]., M.D.  [address]  [place]  [place] [address]  Fax: [phone]   Lianne Bushy, M.D.  [address]  [place]  [place] [address]  Fax: [phone]

## 2010-12-24 NOTE — Discharge Summary (Signed)
[place]  Patient:    [name], [name] Visit Number: [id number] MRN: [id number]          Service Type: MED Location: 2000 2035 01 Attending Physician:  Norman Clay Dictated by:   Darden Palmer., M.D. Admit Date:  09/02/2001 Discharge Date: 09/06/2001   CC:         Maricela Bo, M.D.                           Discharge Summary  FINAL DIAGNOSES: 1. Chest pain of undetermined etiology, possible angina.    a. Catheterization showing moderate disease involving the left anterior       descending, circumflex, and right coronary artery, patent previous stent       that was placed. 2. Mild esophageal reflux. 3. Anxiety. 4. Asthma.  PROCEDURES: 1. Cardiac catheterization. 2. Endoscopy. 3. Cardiolite scan.  HISTORY OF PRESENT ILLNESS:  This 55 year old male had an angioplasty of the right coronary artery in [place] 2002 with stenting.  He had in-stent restenosis in August 2002 with a cutting balloon involved at that time.  He developed significant chest pain while watching the Superbowl and was seen in the emergency room and was admitted to rule out an MI.  Please see the previously- dictated history and physical for remainder of the details.  HOSPITAL COURSE:  Lab data on admission showed hemoglobin of 14.2, hematocrit of 39.2.  PT and PTT were normal.  CPK and MBs were normal.  Troponins were normal.  Lipid panel showed a cholesterol of 181, triglycerides of 252, HDL of 50, and an LDL cholesterol of 81.  All EKGs were thought to be normal, showing just early repolarization.  The patient was placed on anticoagulation and underwent catheterization on September 04, 2001.  He had a segmental 40% proximal stenosis, a 60-70% stenosis of the origin of the diagonal, an ostial diagonal with a 90% stenosis which was a small vessel, and a diagonal with segmental 40% midstenosis.  Circumflex had 40-50% midstenosis.  Right coronary artery  had a 40% midstenosis.  The stent was patent with a focal area of 40% narrowing of the previous stent. The distal right coronary artery posterior descending was small with possible left-to-right collaterals.  RV branch had a subtotal stenosis.  It was throughout that the patient had small-vessel disease, but nothing was seen that was thought to be amenable to angioplasty or stenting.  He continued to complain of some mild anterior chest pain, which was continuous.  Nancie [name] was discontinued, and Norvasc was added.  Beta blockers were discontinued because of asthma.  Gallbladder ultrasound was normal.  Endoscopy was done by Dr. Laural Benes, who found him to have mild nonerosive esophageal reflux disease and recommended continuation of Nexium and recommended increasing the Nexium to twice daily.  For more accurately assessing reflux, a 24-hour ambulatory pH was recommended.  He underwent an adenosine Cardiolite scan that was read by the radiologist as showing no ischemia.  DISCHARGE MEDICATIONS:  He was discharged later on that evening on Norvasc 5 mg daily, Imdur 60 mg daily, Nexium 20 mg b.i.d., Plavix 75 mg daily, Desyrel 100 mg daily, Lipitor 40 mg daily, Advair one puff b.i.d., Claritin 10 mg daily.  FOLLOW-UP:  He is to seem me for an appointment in one week and is to call if there are problems. Dictated by:   Darden Palmer., M.D. Attending Physician:  Norman Clay DD:  09/24/01 TD:  09/24/01 Job: [id number]
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
## 2011-01-06 ENCOUNTER — Ambulatory Visit: Payer: Self-pay | Admitting: Internal Medicine

## 2011-03-18 ENCOUNTER — Telehealth: Payer: Self-pay | Admitting: Internal Medicine

## 2011-03-18 NOTE — Telephone Encounter (Signed)
LMOMTCB

## 2011-03-21 MED ORDER — PROMETHAZINE-CODEINE 6.25-10 MG/5ML PO SYRP: 5.0000 mL | ORAL_SOLUTION | Freq: Four times a day (QID) | ORAL | Status: DC | PRN

## 2011-03-21 MED ORDER — AZITHROMYCIN 250 MG PO TABS: ORAL_TABLET | ORAL | Status: AC

## 2011-03-21 NOTE — Telephone Encounter (Signed)
Pt's wife would like to speak with CY's nurse about the refill she called for on Friday.  Pt stated that when she received her phone call on Friday, we were closed.  Please call at [phone].  Antionette Fairy

## 2011-03-21 NOTE — Telephone Encounter (Signed)
Pt c/o chest congestion, chest tightness, non-productive cough starting Friday. Denies fever, wheezing, increased SOB. Is requesting z-pak, and Prometh/Co cough syrup stating that is what has helped in the past. Please advise. Thanks.  Allergies  Allergen Reactions  . Penicillins

## 2011-03-21 NOTE — Telephone Encounter (Signed)
Ok - Z pak         Prometh w/ codeine cough syrup    200 ml    1 teaspoon   4x daily if needed

## 2011-04-18 ENCOUNTER — Ambulatory Visit: Payer: Self-pay | Admitting: Internal Medicine

## 2011-05-05 LAB — PROTIME-INR
INR: 1
INR: 1
Prothrombin Time: 12.9
Prothrombin Time: 13.1

## 2011-05-05 LAB — COMPREHENSIVE METABOLIC PANEL
ALT: 23
AST: 24
Albumin: 3.5
Alkaline Phosphatase: 66
BUN: 12
CO2: 25
Calcium: 8.8
Chloride: 106
Creatinine, Ser: 0.98
GFR calc Af Amer: >60
GFR calc non Af Amer: >60
Glucose, Bld: 110 — ABNORMAL HIGH
Potassium: 3.9
Sodium: 138
Total Bilirubin: 0.9
Total Protein: 6.5

## 2011-05-05 LAB — POCT I-STAT, CHEM 8
BUN: 18
Calcium, Ion: 1.18
Chloride: 103
Creatinine, Ser: 1.1
Glucose, Bld: 94
HCT: 41
Hemoglobin: 13.9
Potassium: 3.7
Sodium: 136
TCO2: 25

## 2011-05-05 LAB — CBC
HCT: 41.5
HCT: 44.6
Hemoglobin: 14.4
Hemoglobin: 15.3
MCHC: 34.4
MCHC: 34.8
MCV: 86.1
MCV: 86.6
Platelets: 212
Platelets: 232
RBC: 4.82
RBC: 5.15
RDW: 13.4
RDW: 13.5
WBC: 6.4
WBC: 6.8

## 2011-05-05 LAB — CARDIAC PANEL(CRET KIN+CKTOT+MB+TROPI)
CK, MB: 0.8
CK, MB: 1.3
Relative Index: INVALID
Relative Index: INVALID
Total CK: 56
Total CK: 73
Troponin I: 0.01
Troponin I: <0.01

## 2011-05-05 LAB — BASIC METABOLIC PANEL
BUN: 12
BUN: 12
CO2: 25
CO2: 26
Calcium: 8.6
Calcium: 8.9
Chloride: 106
Chloride: 106
Creatinine, Ser: 0.97
Creatinine, Ser: 1
GFR calc Af Amer: >60
GFR calc Af Amer: >60
GFR calc non Af Amer: >60
GFR calc non Af Amer: >60
Glucose, Bld: 110 — ABNORMAL HIGH
Glucose, Bld: 96
Potassium: 4
Potassium: 4.2
Sodium: 137
Sodium: 139

## 2011-05-05 LAB — DIFFERENTIAL
Basophils Absolute: 0
Basophils Relative: 1
Eosinophils Absolute: 0.2
Eosinophils Relative: 2
Lymphocytes Relative: 31
Lymphs Abs: 2
Monocytes Absolute: 0.4
Monocytes Relative: 6
Neutro Abs: 3.8
Neutrophils Relative %: 60

## 2011-05-05 LAB — LIPID PANEL
Cholesterol: 211 — ABNORMAL HIGH
HDL: 42
LDL Cholesterol: 126 — ABNORMAL HIGH
Total CHOL/HDL Ratio: 5
Triglycerides: 213 — ABNORMAL HIGH
VLDL: 43 — ABNORMAL HIGH

## 2011-05-05 LAB — APTT
aPTT: 149 — ABNORMAL HIGH
aPTT: 26

## 2011-05-05 LAB — TROPONIN I: Troponin I: <0.01

## 2011-05-05 LAB — POCT CARDIAC MARKERS
CKMB, poc: <1 — ABNORMAL LOW
CKMB, poc: <1 — ABNORMAL LOW
Myoglobin, poc: 44.4
Myoglobin, poc: 61.4
Operator id: 277751
Operator id: 277751
Troponin i, poc: <0.05
Troponin i, poc: <0.05

## 2011-05-05 LAB — HEPARIN LEVEL (UNFRACTIONATED): Heparin Unfractionated: 1.03 — ABNORMAL HIGH

## 2011-05-05 LAB — HEMOGLOBIN A1C
Hgb A1c MFr Bld: 5.7
Mean Plasma Glucose: 126

## 2011-05-05 LAB — CK TOTAL AND CKMB (NOT AT ARMC)
CK, MB: 1.2
Relative Index: INVALID
Total CK: 65

## 2011-05-05 LAB — B-NATRIURETIC PEPTIDE (CONVERTED LAB): Pro B Natriuretic peptide (BNP): <30

## 2011-05-30 ENCOUNTER — Ambulatory Visit (INDEPENDENT_AMBULATORY_CARE_PROVIDER_SITE_OTHER): Payer: Medicaid Other | Admitting: Internal Medicine

## 2011-05-30 ENCOUNTER — Encounter: Payer: Self-pay | Admitting: Internal Medicine

## 2011-05-30 ENCOUNTER — Ambulatory Visit: Payer: Self-pay | Admitting: Internal Medicine

## 2011-05-30 VITALS — BP 118/64 | HR 74 | Ht 68.0 in | Wt 216.0 lb

## 2011-05-30 DIAGNOSIS — J40 Bronchitis, not specified as acute or chronic: Secondary | ICD-10-CM

## 2011-05-30 DIAGNOSIS — Z23 Encounter for immunization: Secondary | ICD-10-CM

## 2011-05-30 MED ORDER — LEVALBUTEROL HCL 0.63 MG/3ML IN NEBU
0.6300 mg | INHALATION_SOLUTION | RESPIRATORY_TRACT | Status: AC
  Administered 2011-05-30: 0.63 mg via RESPIRATORY_TRACT

## 2011-05-30 MED ORDER — METHYLPREDNISOLONE ACETATE 80 MG/ML IJ SUSP
80.0000 mg | Freq: Once | INTRAMUSCULAR | Status: AC
  Administered 2011-05-30: 80 mg via INTRAMUSCULAR

## 2011-05-30 MED ORDER — AZITHROMYCIN 250 MG PO TABS: ORAL_TABLET | ORAL | Status: AC

## 2011-05-30 MED ORDER — PROMETHAZINE-CODEINE 6.25-10 MG/5ML PO SYRP: 5.0000 mL | ORAL_SOLUTION | ORAL | Status: AC | PRN

## 2011-05-30 NOTE — Progress Notes (Signed)
05/29/11- 55 year old male former smoker followed for asthma, allergic rhinitis complicated by CAD, GERD.... wife here Last here- 10/13/2010 He had done well through the summer the last 2 or 3 days notes increasing sinus and chest congestion cough and thick white or trace yellow sputum. He denies headache, fever, sore throat. Coughing is making bilateral rib cage sore.  ROS-HPI Constitutional:   No-   weight loss, night sweats, fevers, chills, fatigue, lassitude. HEENT:   No-  headaches, difficulty swallowing, tooth/dental problems,  +sore throat,       No-  sneezing, itching, ear ache, nasal congestion, post nasal drip,  CV:  No-  Anginal  chest pain, orthopnea, PND, swelling in lower extremities, anasarca, dizziness, palpitations Resp: No-   shortness of breath with exertion or at rest.              + productive cough,  No non-productive cough,  No- coughing up of blood.              No-   change in color of mucus.  No- wheezing.   Skin: No-   rash or lesions. GI:  No-   heartburn, indigestion, abdominal pain, nausea, vomiting, diarrhea,                 change in bowel habits, loss of appetite GU: No-   dysuria, change in color of urine, no urgency or frequency.  No- flank pain. MS:  No-   joint pain or swelling.  No- decreased range of motion.  No- back pain. Neuro-     nothing unusual Psych:  No- change in mood or affect. No depression or anxiety.  No memory loss.   OBJ General- Alert, Oriented, Affect-appropriate, Distress- none acute, laconic Skin- rash-none, lesions- none, excoriation- none Lymphadenopathy- none Head- atraumatic            Eyes- Gross vision intact, PERRLA, conjunctivae clear secretions            Ears- Hearing, canals-normal            Nose- Clear, no-Septal dev, mucus, polyps, erosion, perforation             Throat- Mallampati II , mucosa clear , drainage- none, tonsils- atrophic Neck- flexible , trachea midline, no stridor , thyroid nl, carotid no  bruit Chest - symmetrical excursion , unlabored           Heart/CV- RRR , no murmur , no gallop  , no rub, nl s1 s2                           - JVD- none , edema- none, stasis changes- none, varices- none           Lung-+harsh cough with raspy breath sounds, dullness-none, rub- none           Chest wall-  Abd- tender-no, distended-no, bowel sounds-present, HSM- no Br/ Gen/ Rectal- Not done, not indicated Extrem- cyanosis- none, clubbing, none, atrophy- none, strength- nl Neuro- grossly intact to observation

## 2011-05-30 NOTE — Patient Instructions (Signed)
Dep 80  Flu vax  Neb Xop 0.63  Z pak script sent to drug store

## 2011-05-31 NOTE — Assessment & Plan Note (Signed)
Acute URI with bronchitis. We have discussed supportive care and available management Plan nebulizer treatment, Z-Pak, Depo-Medrol, flu vaccine

## 2011-06-28 ENCOUNTER — Ambulatory Visit: Payer: Self-pay | Admitting: Emergency Medicine

## 2011-06-28 ENCOUNTER — Emergency Department: Payer: Self-pay | Admitting: Unknown Physician Specialty

## 2011-10-03 ENCOUNTER — Telehealth: Payer: Self-pay | Admitting: Internal Medicine

## 2011-10-03 MED ORDER — PROMETHAZINE-CODEINE 6.25-10 MG/5ML PO SYRP: 5.0000 mL | ORAL_SOLUTION | Freq: Four times a day (QID) | ORAL | Status: AC | PRN

## 2011-10-03 MED ORDER — AZITHROMYCIN 250 MG PO TABS: ORAL_TABLET | ORAL | Status: AC

## 2011-10-03 NOTE — Telephone Encounter (Signed)
Pt's spouse says the pt c/o cough and congestion x 1 day.  She says the patient has no fever, sore throat or wheezing. No SOB. She says the patient normally gets this at the same time every year and usually a Zpak and cough syrup helps. Pls advise. Allergies  Allergen Reactions  . Penicillins

## 2011-10-03 NOTE — Telephone Encounter (Signed)
Per CDY: zpak #1 take as directed and phenergan w/ codeine syrup #24ml, 1 tsp every 6 hours as needed.  Called spoke with patient's wife, Gillis Ends.  Advised of CDY's recs as stated above.  Pt wife okay with these recs and verbalized her understanding.  Both rx's telephoned to verified pharmacy.

## 2011-10-29 ENCOUNTER — Other Ambulatory Visit: Payer: Self-pay | Admitting: Internal Medicine

## 2011-10-31 ENCOUNTER — Other Ambulatory Visit: Payer: Self-pay | Admitting: Pulmonary Disease

## 2011-10-31 NOTE — Telephone Encounter (Signed)
Per patient's request refills for Loratadine and Trazodone are called to [facility].

## 2011-10-31 NOTE — Telephone Encounter (Signed)
Please advise if okay to refill Trazodone-pt has OV for 11-28-2011. Thanks.

## 2011-11-01 ENCOUNTER — Telehealth: Payer: Self-pay | Admitting: Internal Medicine

## 2011-11-01 NOTE — Telephone Encounter (Signed)
LMOMTCB x 1 

## 2011-11-02 NOTE — Telephone Encounter (Signed)
Ok to refill trazodone

## 2011-11-02 NOTE — Telephone Encounter (Signed)
Called pt at [phone] - went directly to msg stating the "customer has a vm box that has not been set up.  pls try your call again later.  Goodbye."  Called pt's home # - [phone] - lmomtcb

## 2011-11-03 NOTE — Telephone Encounter (Signed)
LMTCBx3 I called [facility] and rx are there for the pt. Trazodone was picked up on 10-31-11, refills availabel on loratadine but pt did not pick this up. I will sign off on message and await a call back if needed.  Carron Curie, CMA

## 2011-11-23 ENCOUNTER — Telehealth: Payer: Self-pay | Admitting: Internal Medicine

## 2011-11-23 NOTE — Telephone Encounter (Signed)
ATC number and message states voicemail has not been set up yet and call disconnects. WCB. Carron Curie, CMA I LMTCBx1 on pt home number. Carron Curie, CMA

## 2011-11-24 NOTE — Telephone Encounter (Signed)
Unable to leave message at number given.  Left message at [phone] to call back.

## 2011-11-25 NOTE — Telephone Encounter (Signed)
lmtcb home #

## 2011-11-28 ENCOUNTER — Ambulatory Visit: Payer: Medicaid Other | Admitting: Internal Medicine

## 2011-11-28 NOTE — Telephone Encounter (Signed)
Last ov with CDY 10.22.12, follow up in 6 months > 11/2011 appt cancelled and rsc for 5.29.13.  LMOM TCB x4 at home number and informed pt that will sign off on this message to await call back - new msg will be created when they return call.  Will sign off per triage protocol.

## 2011-11-29 ENCOUNTER — Telehealth: Payer: Self-pay | Admitting: Internal Medicine

## 2011-11-29 NOTE — Telephone Encounter (Signed)
I spoke with [facility] and was advised pt has 3 refills left on the trazodone and 11 refills loratadine. I spoke with spouse and is aware of this and nothing further was needed

## 2012-01-04 ENCOUNTER — Ambulatory Visit (INDEPENDENT_AMBULATORY_CARE_PROVIDER_SITE_OTHER): Payer: Medicaid Other | Admitting: Internal Medicine

## 2012-01-04 ENCOUNTER — Encounter: Payer: Self-pay | Admitting: Internal Medicine

## 2012-01-04 VITALS — BP 112/80 | HR 62 | Ht 68.0 in | Wt 216.6 lb

## 2012-01-04 DIAGNOSIS — J45998 Other asthma: Secondary | ICD-10-CM

## 2012-01-04 DIAGNOSIS — J0100 Acute maxillary sinusitis, unspecified: Secondary | ICD-10-CM

## 2012-01-04 DIAGNOSIS — J309 Allergic rhinitis, unspecified: Secondary | ICD-10-CM

## 2012-01-04 MED ORDER — IPRATROPIUM-ALBUTEROL 0.5-2.5 (3) MG/3ML IN SOLN: 3.0000 mL | Freq: Four times a day (QID) | RESPIRATORY_TRACT | Status: DC | PRN

## 2012-01-04 MED ORDER — PHENYLEPHRINE HCL 1 % NA SOLN
3.0000 [drp] | Freq: Once | NASAL | Status: AC
  Administered 2012-01-04: 3 [drp] via NASAL

## 2012-01-04 MED ORDER — METHYLPREDNISOLONE ACETATE 80 MG/ML IJ SUSP
80.0000 mg | Freq: Once | INTRAMUSCULAR | Status: AC
  Administered 2012-01-04: 80 mg via INTRAMUSCULAR

## 2012-01-04 MED ORDER — EPINEPHRINE 0.3 MG/0.3ML IJ DEVI: 0.3000 mg | Freq: Once | INTRAMUSCULAR | Status: DC

## 2012-01-04 MED ORDER — BUDESONIDE 32 MCG/ACT NA SUSP: 2.0000 | Freq: Every day | NASAL | Status: DC

## 2012-01-04 MED ORDER — BUDESONIDE-FORMOTEROL FUMARATE 160-4.5 MCG/ACT IN AERO: 2.0000 | INHALATION_SPRAY | Freq: Two times a day (BID) | RESPIRATORY_TRACT | Status: DC

## 2012-01-04 MED ORDER — ALBUTEROL SULFATE HFA 108 (90 BASE) MCG/ACT IN AERS: 2.0000 | INHALATION_SPRAY | Freq: Four times a day (QID) | RESPIRATORY_TRACT | Status: DC | PRN

## 2012-01-04 NOTE — Patient Instructions (Addendum)
Neb neo nasal  Depo 80  Script sent printed for rhinocort and symbicort  Other scripts sent  Please call as needed

## 2012-01-04 NOTE — Progress Notes (Signed)
05/29/11- 56 year old male former smoker followed for asthma, allergic rhinitis complicated by CAD, GERD.... wife here Last here- 10/13/2010 He had done well through the summer the last 2 or 3 days notes increasing sinus and chest congestion cough and thick white or trace yellow sputum. He denies headache, fever, sore throat. Coughing is making bilateral rib cage sore.  01/04/12- 56 year old male former smoker followed for asthma, allergic rhinitis complicated by CAD, GERD.... wife here Patient states about the same as last visit.  Pretty good spring with no acute issues until today. He woke this morning with some frontal headache, worse leaning over, little nasal discharge. Denies sore throat or sneeze. Ears okay. No cough or wheeze.  ROS-HPI Constitutional:   No-   weight loss, night sweats, fevers, chills, fatigue, lassitude. HEENT:   No-  headaches, difficulty swallowing, tooth/dental problems,  +sore throat,       +  sneezing, itching, ear ache, nasal congestion, post nasal drip,  CV:  No-  Anginal  chest pain, orthopnea, PND, swelling in lower extremities, anasarca, dizziness, palpitations Resp: No-   shortness of breath with exertion or at rest.              No- productive cough,  No non-productive cough,  No- coughing up of blood.              No-   change in color of mucus.  No- wheezing.   Skin: No-   rash or lesions. GI:  No-   heartburn, indigestion, abdominal pain, nausea, vomiting,  GU:  MS:  No-   joint pain or swelling.  Neuro-     nothing unusual Psych:  No- change in mood or affect. No depression or anxiety.  No memory loss.  OBJ General- Alert, Oriented, Affect-appropriate, Distress- none acute, laconic, obese Skin- rash-none, lesions- none, excoriation- none Lymphadenopathy- none Head- atraumatic            Eyes- Gross vision intact, PERRLA, conjunctivae clear secretions            Ears- Hearing, canals-normal            Nose- thick mucus both nostrils, no-Septal dev,  polyps, erosion, perforation             Throat- Mallampati II-III , mucosa clear , drainage- none, tonsils- atrophic Neck- flexible , trachea midline, no stridor , thyroid nl, carotid no bruit Chest - symmetrical excursion , unlabored           Heart/CV- RRR , no murmur , no gallop  , no rub, nl s1 s2                           - JVD- none , edema- none, stasis changes- none, varices- none           Lung- clear, dullness-none, rub- none           Chest wall-  Abd-  Br/ Gen/ Rectal- Not done, not indicated Extrem- cyanosis- none, clubbing, none, atrophy- none, strength- nl Neuro- grossly intact to observation

## 2012-01-08 DIAGNOSIS — J0100 Acute maxillary sinusitis, unspecified: Secondary | ICD-10-CM | POA: Insufficient documentation

## 2012-01-08 NOTE — Assessment & Plan Note (Signed)
Plan-refill Rhinocort. Nasal decongestant nebulizer, Depo-Medrol, doxycycline.

## 2012-01-08 NOTE — Assessment & Plan Note (Signed)
Good control. We will refill his Symbicort. Hopefully his upper respiratory infection will not trigger exacerbation.

## 2012-02-28 ENCOUNTER — Telehealth: Payer: Self-pay | Admitting: Internal Medicine

## 2012-02-28 MED ORDER — TRAZODONE HCL 100 MG PO TABS: 100.0000 mg | ORAL_TABLET | Freq: Every day | ORAL | Status: DC

## 2012-02-28 NOTE — Telephone Encounter (Signed)
Pt is needing refill on trazodone 100 mg. Last refilled 10/29/11 #90 x 2 refills. Pt last OV 10/29/11. Please advise dr. Maple Hudson thanks

## 2012-02-28 NOTE — Telephone Encounter (Signed)
Need to pick up this am b/c pt is out of medication & his wife wants to pick this up before she goes to work this am-will not get off of work until after 11:00 pm & will not be able to fill until tomorrow if unable to pick up this am.  Antionette Fairy

## 2012-02-28 NOTE — Telephone Encounter (Signed)
Per cy ok to fill Spoke with wife and rx needed to be called in to the pharmacy . rx called in nothing further was needed.

## 2012-03-06 ENCOUNTER — Telehealth: Payer: Self-pay | Admitting: Internal Medicine

## 2012-03-06 MED ORDER — AZITHROMYCIN 250 MG PO TABS: ORAL_TABLET | ORAL | Status: AC

## 2012-03-06 NOTE — Telephone Encounter (Signed)
Tried to call patients home number as provided in chart, call straight to voicemail.  Message left to give office a call back.

## 2012-03-06 NOTE — Telephone Encounter (Signed)
Per CY okay to give Zpak #1 take as directed no refills. 

## 2012-03-06 NOTE — Telephone Encounter (Signed)
Zpack sent to pharm. ATC the pt, msg states caller is currently unavailable. WCB.

## 2012-03-06 NOTE — Telephone Encounter (Signed)
I spoke with the pt wife and she states the pt is c/o sinus congestion and pressure, chest congestion, dry cough, increased SOB all x 3 days. She denies any wheezing or fever at this time.  She states that Dr. Maple Hudson usually gives him a Zpak. Please advise. Carron Curie, CMA Allergies  Allergen Reactions  . Niacin And Related   . Penicillins

## 2012-03-06 NOTE — Telephone Encounter (Signed)
ATC patients spouse at call back number provided.  Received message that the "prescriber is not accepting calls at this time." No option for VM, wcb.

## 2012-03-06 NOTE — Telephone Encounter (Signed)
Pt is aware. Jennifer Castillo, CMA  

## 2012-03-22 ENCOUNTER — Telehealth: Payer: Self-pay | Admitting: Internal Medicine

## 2012-03-22 MED ORDER — AZITHROMYCIN 250 MG PO TABS: ORAL_TABLET | ORAL | Status: DC

## 2012-03-22 MED ORDER — PROMETHAZINE-CODEINE 6.25-10 MG/5ML PO SYRP: 5.0000 mL | ORAL_SOLUTION | Freq: Four times a day (QID) | ORAL | Status: AC | PRN

## 2012-03-22 NOTE — Telephone Encounter (Signed)
I spoke with spouse and is awre of CDY recs. I have called cough syrup into the pharmacy and zpak has been sent. Nothing further was needed

## 2012-03-22 NOTE — Telephone Encounter (Signed)
I spoke with spouse and she stated pt c/o slight increase SOB, cough w/ yellow phlem, wheezing at night, chest congestion, chest tx off and on x couple weeks. Pt has been doing his breathing tx's. She is wanting pt to have something called in for this. Would like a refill on the promethazine w/ codeine cough syrup as well. Please advise Dr. Maple Hudson thanks Last OV 01/04/12 next ov 07/06/12 Allergies  Allergen Reactions  . Niacin And Related   . Penicillins

## 2012-03-22 NOTE — Telephone Encounter (Signed)
Per Dr. Maple Hudson  1) ok to refill cough syrup                          2) Zpak Thanks

## 2012-03-30 ENCOUNTER — Telehealth: Payer: Self-pay | Admitting: Internal Medicine

## 2012-03-30 MED ORDER — PREDNISONE 20 MG PO TABS: 20.0000 mg | ORAL_TABLET | Freq: Every day | ORAL | Status: DC

## 2012-03-30 NOTE — Telephone Encounter (Signed)
Per CY-give Prednisone 20 mg #5 take 1 po qd no refills.

## 2012-03-30 NOTE — Telephone Encounter (Signed)
Called and spoke with pts spouse and she stated that the pt was given phenergan with codeine last week and this has not helped with the coughing.  Still coughing a lot and sometimes this is a violent cough, but a dry cough.  Requesting further recs from CY.  Please advise.  Thanks  Allergies  Allergen Reactions  . Niacin And Related   . Penicillins    Last ov---01/04/12 Next ov--07/06/12

## 2012-03-30 NOTE — Telephone Encounter (Signed)
Rx sent. Home number stated subscriber not accepting calls at this time. I called and LM on number listed with emergency contact. Carron Curie, CMA

## 2012-04-02 NOTE — Telephone Encounter (Signed)
I called pharmacy and pt picked up RX.[name], CMA

## 2012-04-13 ENCOUNTER — Telehealth: Payer: Self-pay | Admitting: Internal Medicine

## 2012-04-13 NOTE — Telephone Encounter (Signed)
I spoke with spouse and stated pt is still coughing a lot and is having chest congestion. He ahs used up his phenergan w/ codeine cough syrup. Would like a refill until he is seen by Dr. Maple Hudson on Monday 04/16/12. Please advise thanks  -refilled 03-22-12 #200 ml x 0 refills

## 2012-04-16 ENCOUNTER — Encounter: Payer: Self-pay | Admitting: Internal Medicine

## 2012-04-16 ENCOUNTER — Ambulatory Visit (INDEPENDENT_AMBULATORY_CARE_PROVIDER_SITE_OTHER)
Admission: RE | Admit: 2012-04-16 | Discharge: 2012-04-16 | Disposition: A | Discharge status: Home / Self Care | Payer: Medicaid Other | Source: Ambulatory Visit | Attending: Internal Medicine | Admitting: Internal Medicine

## 2012-04-16 ENCOUNTER — Ambulatory Visit (INDEPENDENT_AMBULATORY_CARE_PROVIDER_SITE_OTHER): Payer: Medicaid Other | Admitting: Internal Medicine

## 2012-04-16 ENCOUNTER — Telehealth: Payer: Self-pay | Admitting: Internal Medicine

## 2012-04-16 VITALS — BP 120/80 | HR 79 | Ht 68.0 in | Wt 215.6 lb

## 2012-04-16 DIAGNOSIS — J209 Acute bronchitis, unspecified: Secondary | ICD-10-CM

## 2012-04-16 DIAGNOSIS — J45909 Unspecified asthma, uncomplicated: Secondary | ICD-10-CM

## 2012-04-16 DIAGNOSIS — Z23 Encounter for immunization: Secondary | ICD-10-CM

## 2012-04-16 DIAGNOSIS — K219 Gastro-esophageal reflux disease without esophagitis: Secondary | ICD-10-CM

## 2012-04-16 DIAGNOSIS — J45998 Other asthma: Secondary | ICD-10-CM

## 2012-04-16 MED ORDER — METHYLPREDNISOLONE ACETATE 80 MG/ML IJ SUSP
80.0000 mg | Freq: Once | INTRAMUSCULAR | Status: AC
  Administered 2012-04-16: 80 mg via INTRAMUSCULAR

## 2012-04-16 MED ORDER — PROMETHAZINE-CODEINE 6.25-10 MG/5ML PO SYRP: 5.0000 mL | ORAL_SOLUTION | Freq: Four times a day (QID) | ORAL | Status: AC | PRN

## 2012-04-16 MED ORDER — IPRATROPIUM-ALBUTEROL 0.5-2.5 (3) MG/3ML IN SOLN: 3.0000 mL | Freq: Four times a day (QID) | RESPIRATORY_TRACT | Status: DC | PRN

## 2012-04-16 MED ORDER — ESOMEPRAZOLE MAGNESIUM 20 MG PO CPDR: 20.0000 mg | DELAYED_RELEASE_CAPSULE | Freq: Every day | ORAL | Status: DC

## 2012-04-16 MED ORDER — LEVALBUTEROL HCL 0.63 MG/3ML IN NEBU
0.6300 mg | INHALATION_SOLUTION | Freq: Once | RESPIRATORY_TRACT | Status: AC
  Administered 2012-04-16: 0.63 mg via RESPIRATORY_TRACT

## 2012-04-16 MED ORDER — HYDROCODONE-HOMATROPINE 5-1.5 MG/5ML PO SYRP: 5.0000 mL | ORAL_SOLUTION | Freq: Four times a day (QID) | ORAL | Status: AC | PRN

## 2012-04-16 NOTE — Patient Instructions (Addendum)
Order CXR   Dx acute bronchitis  Neb xop 0.63  Depo 80  Script for cough syrup  Continue Nexium.  Also carry some Tums so you can chew and swallow one any time you feel the heart burn  Flu  vax

## 2012-04-16 NOTE — Telephone Encounter (Signed)
Per CY-okay to change to prometh/codiene syrup

## 2012-04-16 NOTE — Telephone Encounter (Signed)
I have called prometh/codeine called in for pt--lmomtcb x1 for pt to make aware

## 2012-04-16 NOTE — Telephone Encounter (Signed)
Pt states hycodan too expensive Looks like you gave prometh with cod syrup in the past  Please advise, thanks! Allergies  Allergen Reactions  . Niacin And Related   . Penicillins

## 2012-04-16 NOTE — Telephone Encounter (Signed)
Seen ov 04/16/12

## 2012-04-16 NOTE — Progress Notes (Signed)
05/29/11- 56 year old male former smoker followed for asthma, allergic rhinitis complicated by CAD, GERD.... wife here Last here- 10/13/2010 He had done well through the summer the last 2 or 3 days notes increasing sinus and chest congestion cough and thick white or trace yellow sputum. He denies headache, fever, sore throat. Coughing is making bilateral rib cage sore.  01/04/12- 56 year old male former smoker followed for asthma, allergic rhinitis complicated by CAD, GERD.... wife here Patient states about the same as last visit.  Pretty good spring with no acute issues until today. He woke this morning with some frontal headache, worse leaning over, little nasal discharge. Denies sore throat or sneeze. Ears okay. No cough or wheeze.  04/16/12- 56 year old male former smoker followed for asthma, allergic rhinitis complicated by CAD, GERD.... wife here ACUTE VISIT: cough since 03-06-12;dry hacky cough-has had 2 rounds of Zithromax, 1 prednisone taper and cough syrup-no better. Hard cough all summer, since at least mid July. Occasionally chokes while eating. Persistent sinus drip. Coughed triggers heartburn despite Nexium and we again discussed cyclical cough and the importance of controlling GERD. Short of breath at rest some upper anterior chest wall pressure which is nonexertional. Nebulizer treatments helps some.  ROS-HPI Constitutional:   No-   weight loss, night sweats, fevers, chills, fatigue, lassitude. HEENT:   No-  headaches, difficulty swallowing, tooth/dental problems,  +sore throat,       +  sneezing, itching, ear ache, nasal congestion, +post nasal drip,  CV:  No-  Anginal  chest pain, orthopnea, PND, swelling in lower extremities, anasarca, dizziness, palpitations Resp: +   shortness of breath with exertion or at rest.              No- productive cough,  +No non-productive cough,  No- coughing up of blood.              No-   change in color of mucus.  No- wheezing.   Skin: No-   rash or  lesions. GI:  +   heartburn, indigestion, abdominal pain, nausea, vomiting,  GU:  MS:  No-   joint pain or swelling.  Neuro-     nothing unusual Psych:  No- change in mood or affect. No depression or anxiety.  No memory loss.  OBJ General- Alert, Oriented, Affect-appropriate, Distress- none acute, laconic, obese Skin- rash-none, lesions- none, excoriation- none Lymphadenopathy- none Head- atraumatic            Eyes- Gross vision intact, PERRLA, conjunctivae clear secretions            Ears- Hearing, canals-normal            Nose- thick mucus both nostrils, no-Septal dev, polyps, erosion, perforation             Throat- Mallampati II-III , mucosa clear , drainage- none, tonsils- atrophic Neck- flexible , trachea midline, no stridor , thyroid nl, carotid no bruit Chest - symmetrical excursion , unlabored           Heart/CV- RRR , no murmur , no gallop  , no rub, nl s1 s2                           - JVD- none , edema- none, stasis changes- none, varices- none           Lung- + harsh, paroxysmal cough, no wheeze, dullness-none, rub- none           Chest wall-  Abd-  Br/ Gen/ Rectal- Not done, not indicated Extrem- cyanosis- none, clubbing, none, atrophy- none, strength- nl Neuro- grossly intact to observation

## 2012-04-17 ENCOUNTER — Encounter: Payer: Self-pay | Admitting: *Deleted

## 2012-04-17 ENCOUNTER — Telehealth: Payer: Self-pay | Admitting: Internal Medicine

## 2012-04-17 NOTE — Telephone Encounter (Signed)
Pt's spouse called back & stated the correct number to reach her at is [phone].  Advised new Rx called in for prometh/codeine was called in for pt.  Pt's spouse verbalized understanding.  Would like to know if we have gotten CXR results from yesterday.  Antionette Fairy

## 2012-04-17 NOTE — Telephone Encounter (Signed)
Medicaid will not cover Nexium. The covered alternatives are:  Pantoprazole, Lansoprazole OTC, Omeprazole OTC and Prilosec OTC. Pls advise on changing Nexium to one of the covered alternatives. Allergies  Allergen Reactions  . Niacin And Related   . Penicillins

## 2012-04-17 NOTE — Telephone Encounter (Signed)
lmomtcb x 2  

## 2012-04-17 NOTE — Telephone Encounter (Signed)
lmomtcb x1 for pt 

## 2012-04-17 NOTE — Telephone Encounter (Signed)
Pt's wife returned triage's call.  Advised that CY will not prescribe Nexium.  Pt's wife verbalized understanding & stated nothing further needed regarding the nexium at this time.  Antionette Fairy

## 2012-04-17 NOTE — Telephone Encounter (Signed)
Per CY-he will need to go through his PCP as CY did this one time RX as a curtesy.

## 2012-04-17 NOTE — Telephone Encounter (Signed)
Notes Recorded by Waymon Budge, MD on 04/16/2012 at 11:38 AM CXR- clear lungs with no new process. Old CABG surgical changes  lmomtcb x1

## 2012-04-18 NOTE — Telephone Encounter (Signed)
LMTCB

## 2012-04-19 NOTE — Telephone Encounter (Signed)
Called, spoke with pt's wife.  Informed her of cxr results per Dr. Maple Hudson.  She verbalized understanding of this and voiced no further questions/concerns at this time.

## 2012-04-22 NOTE — Assessment & Plan Note (Signed)
Persistent asthma with bronchitis. Heart burn suggest this is cyclical cough, sustained by reflux Plan-nebulizer treatment with Xopenex, Depo-Medrol, refill cough syrup, chest x-ray

## 2012-04-22 NOTE — Assessment & Plan Note (Signed)
Dr. Donnie Aho prescribed his Nexium the patient will ask him to refill. We emphasized reflux precautions

## 2012-05-18 ENCOUNTER — Telehealth: Payer: Self-pay | Admitting: Internal Medicine

## 2012-05-18 NOTE — Telephone Encounter (Signed)
Member # [id number] T. Nexium 20 mg is not covered my Medicaid. The pt needs to try and fail 2 of the following:  Prilosec, O,eprazole OTC, Pantoprazole, Protonix or Prevacid. Pls advise. Allergies  Allergen Reactions  . Niacin And Related   . Penicillins

## 2012-05-21 NOTE — Telephone Encounter (Signed)
Per CY: if he has a primary doc, that person should fill this.  Otherwise, use OTC omeprazole.  Called [facility] in [place], spoke with Crystal.  When the nexium was rx'd the patient was given PRN refills.  Crystal is to cancel these additional refills and advise pt to try otc omeprazole.  Nothing further needed; will sign off.

## 2012-07-06 ENCOUNTER — Ambulatory Visit: Payer: Medicaid Other | Admitting: Internal Medicine

## 2012-08-13 ENCOUNTER — Telehealth: Payer: Self-pay | Admitting: Internal Medicine

## 2012-08-13 MED ORDER — AZITHROMYCIN 250 MG PO TABS: ORAL_TABLET | ORAL | Status: DC

## 2012-08-13 MED ORDER — HYDROCOD POLST-CHLORPHEN POLST 10-8 MG/5ML PO LQCR: 5.0000 mL | Freq: Two times a day (BID) | ORAL | Status: DC

## 2012-08-13 NOTE — Telephone Encounter (Signed)
Spoke with patients wife-States that she has recently been sick with flu like symptoms(was tested and negative for flu) but now the patient is having the same symptoms of fever/chills, SOB, wheezing, cough-non productive, chest congestion, and body aches. Has used nebulizer treatments with little relief. Pt would like to have Zpak and Tussionex Rx's for this. Please advise. Thanks.

## 2012-08-13 NOTE — Telephone Encounter (Signed)
rx sent. LMTCBx1 to advise the pt. Carron Curie, CMA

## 2012-08-13 NOTE — Telephone Encounter (Signed)
Per CY - Zpack, Tussionex 1 tsp Q 12hrs prn for cough 0 refills.

## 2012-08-14 NOTE — Telephone Encounter (Signed)
LMTCB x 1 

## 2012-08-14 NOTE — Telephone Encounter (Signed)
Pt's wife returned triage's call.   [name]

## 2012-08-17 ENCOUNTER — Ambulatory Visit: Payer: Medicaid Other | Admitting: Internal Medicine

## 2012-09-18 ENCOUNTER — Ambulatory Visit (INDEPENDENT_AMBULATORY_CARE_PROVIDER_SITE_OTHER): Payer: BC Managed Care – PPO | Admitting: Internal Medicine

## 2012-09-18 ENCOUNTER — Encounter: Payer: Self-pay | Admitting: Internal Medicine

## 2012-09-18 VITALS — BP 126/70 | HR 60 | Ht 68.0 in | Wt 214.0 lb

## 2012-09-18 DIAGNOSIS — J45998 Other asthma: Secondary | ICD-10-CM

## 2012-09-18 DIAGNOSIS — J45909 Unspecified asthma, uncomplicated: Secondary | ICD-10-CM

## 2012-09-18 DIAGNOSIS — J209 Acute bronchitis, unspecified: Secondary | ICD-10-CM

## 2012-09-18 MED ORDER — LEVALBUTEROL HCL 0.63 MG/3ML IN NEBU
0.6300 mg | INHALATION_SOLUTION | Freq: Once | RESPIRATORY_TRACT | Status: AC
  Administered 2012-09-18: 0.63 mg via RESPIRATORY_TRACT

## 2012-09-18 MED ORDER — PROMETHAZINE-CODEINE 6.25-10 MG/5ML PO SYRP: 5.0000 mL | ORAL_SOLUTION | Freq: Four times a day (QID) | ORAL | Status: DC | PRN

## 2012-09-18 MED ORDER — METHYLPREDNISOLONE ACETATE 80 MG/ML IJ SUSP
80.0000 mg | Freq: Once | INTRAMUSCULAR | Status: AC
  Administered 2012-09-18: 80 mg via INTRAMUSCULAR

## 2012-09-18 MED ORDER — LORATADINE 10 MG PO TABS: ORAL_TABLET | ORAL | Status: DC

## 2012-09-18 MED ORDER — PREDNISONE 10 MG PO TABS: ORAL_TABLET | ORAL | Status: DC

## 2012-09-18 NOTE — Progress Notes (Signed)
05/29/11- 57 year old male former smoker followed for asthma, allergic rhinitis complicated by CAD, GERD.... wife here Last here- 10/13/2010 He had done well through the summer the last 2 or 3 days notes increasing sinus and chest congestion cough and thick white or trace yellow sputum. He denies headache, fever, sore throat. Coughing is making bilateral rib cage sore.  01/04/12- 57 year old male former smoker followed for asthma, allergic rhinitis complicated by CAD, GERD.... wife here Patient states about the same as last visit.  Pretty good spring with no acute issues until today. He woke this morning with some frontal headache, worse leaning over, little nasal discharge. Denies sore throat or sneeze. Ears okay. No cough or wheeze.  04/16/12- 57 year old male former smoker followed for asthma, allergic rhinitis complicated by CAD, GERD.... wife here ACUTE VISIT: cough since 03-06-12;dry hacky cough-has had 2 rounds of Zithromax, 1 prednisone taper and cough syrup-no better. Hard cough all summer, since at least mid July. Occasionally chokes while eating. Persistent sinus drip. Coughed triggers heartburn despite Nexium and we again discussed cyclical cough and the importance of controlling GERD. Short of breath at rest some upper anterior chest wall pressure which is nonexertional. Nebulizer treatments helps some.  09/18/12- 57 year old male former smoker followed for asthma, allergic rhinitis complicated by CAD, GERD.... wife here FOLLOWS FOR: dry hacky cough; only during the day; insurance will not cover Tussionex-needs different cough syrup Same dry cough since July with no acute event. Nebulizer does help using new duoneb. Frontal headache, always stuffy, scant clear nasal discharge. Denies any reflux or aspiration event. Medications reviewed. CXR 04/17/12-  IMPRESSION:  Status post CABG. No active disease.  Original Report Authenticated By: Natasha Mead, M.D.  ROS-HPI Constitutional:   No-    weight loss, night sweats, fevers, chills, fatigue, lassitude. HEENT:   No-  headaches, difficulty swallowing, tooth/dental problems,  +sore throat,       +  sneezing, itching, ear ache, nasal congestion, +post nasal drip,  CV:  No-  Anginal  chest pain, orthopnea, PND, swelling in lower extremities, anasarca, dizziness, palpitations Resp: +   shortness of breath with exertion or at rest.              No- productive cough,  +No non-productive cough,  No- coughing up of blood.              No-   change in color of mucus.  No- wheezing.   Skin: No-   rash or lesions. GI:  +   heartburn, indigestion, abdominal pain, nausea, vomiting,  GU:  MS:  No-   joint pain or swelling.  Neuro-     nothing unusual Psych:  No- change in mood or affect. No depression or anxiety.  No memory loss.  OBJ General- Alert, Oriented, Affect-appropriate, Distress- none acute, laconic, obese Skin- rash-none, lesions- none, excoriation- none Lymphadenopathy- none Head- atraumatic            Eyes- Gross vision intact, PERRLA, conjunctivae clear secretions            Ears- Hearing, canals-normal            Nose- thick mucus both nostrils, no-Septal dev, polyps, erosion, perforation             Throat- Mallampati II-III , mucosa clear , drainage- none, tonsils- atrophic Neck- flexible , trachea midline, no stridor , thyroid nl, carotid no bruit Chest - symmetrical excursion , unlabored  Heart/CV- RRR , no murmur , no gallop  , no rub, nl s1 s2                           - JVD- none , edema- none, stasis changes- none, varices- none           Lung- + harsh, paroxysmal cough again noted, +trace wheeze, dullness-none, rub- none           Chest wall-  Abd-  Br/ Gen/ Rectal- Not done, not indicated Extrem- cyanosis- none, clubbing, none, atrophy- none, strength- nl Neuro- grossly intact to observation

## 2012-09-18 NOTE — Patient Instructions (Addendum)
Script for prednisone taper  Script to refill loratadine  Neb xop 0.63  Depo 80

## 2012-09-23 NOTE — Assessment & Plan Note (Signed)
Primary pattern seems to be and asthma with bronchitis which is intermittent and non-progressive.  Plan-nebulizer with Xopenex, Depo-Medrol, prednisone a day taper, Phenergan with codeine cough syrup.

## 2012-11-01 ENCOUNTER — Ambulatory Visit (INDEPENDENT_AMBULATORY_CARE_PROVIDER_SITE_OTHER): Payer: BC Managed Care – PPO | Admitting: Internal Medicine

## 2012-11-01 ENCOUNTER — Encounter: Payer: Self-pay | Admitting: Internal Medicine

## 2012-11-01 VITALS — BP 128/68 | HR 73 | Ht 68.0 in | Wt 214.0 lb

## 2012-11-01 DIAGNOSIS — J309 Allergic rhinitis, unspecified: Secondary | ICD-10-CM

## 2012-11-01 DIAGNOSIS — J302 Other seasonal allergic rhinitis: Secondary | ICD-10-CM

## 2012-11-01 DIAGNOSIS — J45998 Other asthma: Secondary | ICD-10-CM

## 2012-11-01 DIAGNOSIS — J45909 Unspecified asthma, uncomplicated: Secondary | ICD-10-CM

## 2012-11-01 MED ORDER — TRAZODONE HCL 100 MG PO TABS: 100.0000 mg | ORAL_TABLET | Freq: Every day | ORAL | Status: DC

## 2012-11-01 MED ORDER — METHYLPREDNISOLONE ACETATE 80 MG/ML IJ SUSP
80.0000 mg | Freq: Once | INTRAMUSCULAR | Status: AC
  Administered 2012-11-01: 80 mg via INTRAMUSCULAR

## 2012-11-01 MED ORDER — PROMETHAZINE-CODEINE 6.25-10 MG/5ML PO SYRP: 5.0000 mL | ORAL_SOLUTION | Freq: Four times a day (QID) | ORAL | Status: DC | PRN

## 2012-11-01 MED ORDER — LORATADINE 10 MG PO TABS: ORAL_TABLET | ORAL | Status: DC

## 2012-11-01 MED ORDER — LEVALBUTEROL HCL 0.63 MG/3ML IN NEBU
0.6300 mg | INHALATION_SOLUTION | Freq: Once | RESPIRATORY_TRACT | Status: AC
  Administered 2012-11-01: 0.63 mg via RESPIRATORY_TRACT

## 2012-11-01 NOTE — Patient Instructions (Addendum)
Neb Xop 0.63  Depo 80  Continue your routine meds  Refills for claritin, cough syrup, trazodone

## 2012-11-01 NOTE — Assessment & Plan Note (Signed)
Acute rhinitis and bronchitis. Unclear if this is viral or allergic. Plan- depomedrol, continue rhinocort

## 2012-11-01 NOTE — Assessment & Plan Note (Signed)
Asthma/ bronchitis with rhinitis. Could be viral or allergic at this point.  Plan - see if same Rx as last time works again. Discussed maintenance control. See how long he stays well. Consider re-try Singulair.

## 2012-11-01 NOTE — Progress Notes (Signed)
05/29/11- 57 year old male former smoker followed for asthma, allergic rhinitis complicated by CAD, GERD.... wife here Last here- 10/13/2010 He had done well through the summer the last 2 or 3 days notes increasing sinus and chest congestion cough and thick white or trace yellow sputum. He denies headache, fever, sore throat. Coughing is making bilateral rib cage sore.  01/04/12- 57 year old male former smoker followed for asthma, allergic rhinitis complicated by CAD, GERD.... wife here Patient states about the same as last visit.  Pretty good spring with no acute issues until today. He woke this morning with some frontal headache, worse leaning over, little nasal discharge. Denies sore throat or sneeze. Ears okay. No cough or wheeze.  04/16/12- 57 year old male former smoker followed for asthma, allergic rhinitis complicated by CAD, GERD.... wife here ACUTE VISIT: cough since 03-06-12;dry hacky cough-has had 2 rounds of Zithromax, 1 prednisone taper and cough syrup-no better. Hard cough all summer, since at least mid July. Occasionally chokes while eating. Persistent sinus drip. Coughed triggers heartburn despite Nexium and we again discussed cyclical cough and the importance of controlling GERD. Short of breath at rest some upper anterior chest wall pressure which is nonexertional. Nebulizer treatments helps some.  09/18/12- 57 year old male former smoker followed for asthma, allergic rhinitis complicated by CAD, GERD.... wife here FOLLOWS FOR: dry hacky cough; only during the day; insurance will not cover Tussionex-needs different cough syrup Same dry cough since July with no acute event. Nebulizer does help using new duoneb. Frontal headache, always stuffy, scant clear nasal discharge. Denies any reflux or aspiration event. Medications reviewed. CXR 04/17/12-  IMPRESSION:  Status post CABG. No active disease.  Original Report Authenticated By: Natasha Mead, M.D.  11/01/12- 57 year old male former  smoker followed for asthma, allergic rhinitis complicated by CAD, GERD.... wife here FOLLOWS FOR: cough went away but came back about 2 days ago-dry and hacky cough.Wheezing, SOB as well. Felt well after neb/ depo LOV, until past week. C/o incr head and chest congestion, post-nasal drip, chest tight. Denies a cold or fever. Using his neb 1x daily, Symbicort, Rhinocort.  ROS-HPI Constitutional:   No-   weight loss, night sweats, fevers, chills, fatigue, lassitude. HEENT:   No-  headaches, difficulty swallowing, tooth/dental problems,  +sore throat,       +  sneezing, itching, ear ache, nasal congestion, +post nasal drip,  CV:  No-  Anginal  chest pain, orthopnea, PND, swelling in lower extremities, anasarca, dizziness, palpitations Resp: +   shortness of breath with exertion or at rest.              No- productive cough,  + non-productive cough,  No- coughing up of blood.              No-   change in color of mucus.  No- wheezing.   Skin: No-   rash or lesions. GI:  +   heartburn, indigestion, abdominal pain, nausea, vomiting,  GU:  MS:  No-   joint pain or swelling.  Neuro-     nothing unusual Psych:  No- change in mood or affect. No depression or anxiety.  No memory loss.  OBJ General- Alert, Oriented, Affect-appropriate, Distress- none acute, laconic, obese Skin- rash-none, lesions- none, excoriation- none Lymphadenopathy- none Head- atraumatic            Eyes- Gross vision intact, PERRLA, conjunctivae clear secretions            Ears- Hearing, canals-normal  Nose- +thick mucus both nostrils, no-Septal dev, polyps, erosion, perforation             Throat- Mallampati II-III , mucosa clear , drainage- none, tonsils- atrophic Neck- flexible , trachea midline, no stridor , thyroid nl, carotid no bruit Chest - symmetrical excursion , unlabored           Heart/CV- RRR , no murmur , no gallop  , no rub, nl s1 s2                           - JVD- none , edema- none, stasis changes-  none, varices- none           Lung- + harsh, wheezy cough again noted,  dullness-none, rub- none           Chest wall-  Abd-  Br/ Gen/ Rectal- Not done, not indicated Extrem- cyanosis- none, clubbing, none, atrophy- none, strength- nl Neuro- grossly intact to observation

## 2012-11-05 ENCOUNTER — Telehealth: Payer: Self-pay | Admitting: Internal Medicine

## 2012-11-06 MED ORDER — PREDNISONE 10 MG PO TABS: ORAL_TABLET | ORAL | Status: DC

## 2012-11-06 NOTE — Telephone Encounter (Signed)
Spoke with patient spouse, she states when patient was here 11/01/12 Dr. Maple Hudson stated that he would "follow the same course of action" as the visit prior on 09/18/12 by calling in prednisone as well as the other regimen for patients symptoms and complaints. Per patients spouse, pred was not called in and patient is requesting this medication because "he wont get better without it" Dr. Maple Hudson please advise,thank you  Pharmacy; [facility] [address] [place]  OV on 11/01/12: Patient Instructions    Neb Xop 0.63  Depo 80  Continue your routine meds  Refills for claritin, cough syrup, trazodone    OV on 09/18/12: Patient Instructions    Script for prednisone taper  Script to refill loratadine  Neb xop 0.63  Depo 80    Next OV: 05/10/13  Allergies  Allergen Reactions  . Niacin And Related   . Penicillins

## 2012-11-06 NOTE — Telephone Encounter (Signed)
Left message that rx has been sent to pharmacy and if any other questions or concerns to call the office.

## 2012-11-06 NOTE — Telephone Encounter (Signed)
Per CY----  Ok to send in the p8d taper.   This has been sent in to the [facility] pharmacy per pts request.  i called and lmom to make them aware.

## 2012-11-06 NOTE — Telephone Encounter (Signed)
Ok prednisone 10 mg, # 20, 4 X 2 DAYS, 3 X 2 DAYS, 2 X 2 DAYS, 1 X 2 DAYS  

## 2012-12-07 ENCOUNTER — Ambulatory Visit: Payer: Self-pay | Admitting: Otolaryngology

## 2012-12-28 ENCOUNTER — Ambulatory Visit: Payer: Self-pay | Admitting: Otolaryngology

## 2012-12-28 LAB — CREATININE, SERUM
Creatinine: 1.03 mg/dL (ref 0.60–1.30)
EGFR (African American): >60
EGFR (Non-African Amer.): >60

## 2013-02-10 ENCOUNTER — Other Ambulatory Visit: Payer: Self-pay | Admitting: Internal Medicine

## 2013-02-19 ENCOUNTER — Ambulatory Visit: Payer: Self-pay | Admitting: Specialist

## 2013-02-19 LAB — BASIC METABOLIC PANEL
Anion Gap: 6 — ABNORMAL LOW (ref 7–16)
BUN: 16 mg/dL (ref 7–18)
Calcium, Total: 8.9 mg/dL (ref 8.5–10.1)
Chloride: 106 mmol/L (ref 98–107)
Co2: 28 mmol/L (ref 21–32)
Creatinine: 0.9 mg/dL (ref 0.60–1.30)
EGFR (African American): >60
EGFR (Non-African Amer.): >60
Glucose: 79 mg/dL (ref 65–99)
Osmolality: 280 (ref 275–301)
Potassium: 4 mmol/L (ref 3.5–5.1)
Sodium: 140 mmol/L (ref 136–145)

## 2013-02-19 LAB — CBC WITH DIFFERENTIAL/PLATELET
Basophil #: 0.1 10*3/uL (ref 0.0–0.1)
Basophil %: 1.1 %
Eosinophil #: 0.4 10*3/uL (ref 0.0–0.7)
Eosinophil %: 4.9 %
HCT: 42.1 % (ref 40.0–52.0)
HGB: 14 g/dL (ref 13.0–18.0)
Lymphocyte #: 2.4 10*3/uL (ref 1.0–3.6)
Lymphocyte %: 30.6 %
MCH: 28.1 pg (ref 26.0–34.0)
MCHC: 33.2 g/dL (ref 32.0–36.0)
MCV: 85 fL (ref 80–100)
Monocyte #: 0.5 x10 3/mm (ref 0.2–1.0)
Monocyte %: 6.7 %
Neutrophil #: 4.4 10*3/uL (ref 1.4–6.5)
Neutrophil %: 56.7 %
Platelet: 219 10*3/uL (ref 150–440)
RBC: 4.97 10*6/uL (ref 4.40–5.90)
RDW: 13.3 % (ref 11.5–14.5)
WBC: 7.8 10*3/uL (ref 3.8–10.6)

## 2013-02-22 ENCOUNTER — Observation Stay (HOSPITAL_COMMUNITY)
Admission: EM | Admit: 2013-02-22 | Discharge: 2013-02-24 | Disposition: A | Discharge status: Home / Self Care | Payer: BC Managed Care – PPO | Attending: Internal Medicine | Admitting: Internal Medicine

## 2013-02-22 DIAGNOSIS — J302 Other seasonal allergic rhinitis: Secondary | ICD-10-CM | POA: Diagnosis present

## 2013-02-22 DIAGNOSIS — R079 Chest pain, unspecified: Principal | ICD-10-CM | POA: Insufficient documentation

## 2013-02-22 DIAGNOSIS — Z951 Presence of aortocoronary bypass graft: Secondary | ICD-10-CM | POA: Insufficient documentation

## 2013-02-22 DIAGNOSIS — J45998 Other asthma: Secondary | ICD-10-CM | POA: Diagnosis present

## 2013-02-22 DIAGNOSIS — I10 Essential (primary) hypertension: Secondary | ICD-10-CM | POA: Diagnosis present

## 2013-02-22 DIAGNOSIS — K219 Gastro-esophageal reflux disease without esophagitis: Secondary | ICD-10-CM | POA: Diagnosis present

## 2013-02-22 DIAGNOSIS — I498 Other specified cardiac arrhythmias: Secondary | ICD-10-CM | POA: Insufficient documentation

## 2013-02-22 DIAGNOSIS — I2510 Atherosclerotic heart disease of native coronary artery without angina pectoris: Secondary | ICD-10-CM | POA: Diagnosis present

## 2013-02-22 DIAGNOSIS — E785 Hyperlipidemia, unspecified: Secondary | ICD-10-CM | POA: Insufficient documentation

## 2013-02-22 HISTORY — DX: Pure hypercholesterolemia, unspecified: E78.00

## 2013-02-22 HISTORY — DX: Atherosclerotic heart disease of native coronary artery without angina pectoris: I25.10

## 2013-02-22 HISTORY — DX: Essential (primary) hypertension: I10

## 2013-02-23 ENCOUNTER — Emergency Department (HOSPITAL_COMMUNITY): Payer: BC Managed Care – PPO

## 2013-02-23 ENCOUNTER — Encounter (HOSPITAL_COMMUNITY): Payer: Self-pay

## 2013-02-23 DIAGNOSIS — Z951 Presence of aortocoronary bypass graft: Secondary | ICD-10-CM

## 2013-02-23 DIAGNOSIS — R079 Chest pain, unspecified: Secondary | ICD-10-CM | POA: Diagnosis present

## 2013-02-23 DIAGNOSIS — K219 Gastro-esophageal reflux disease without esophagitis: Secondary | ICD-10-CM

## 2013-02-23 DIAGNOSIS — I10 Essential (primary) hypertension: Secondary | ICD-10-CM | POA: Diagnosis present

## 2013-02-23 DIAGNOSIS — E785 Hyperlipidemia, unspecified: Secondary | ICD-10-CM

## 2013-02-23 DIAGNOSIS — I2510 Atherosclerotic heart disease of native coronary artery without angina pectoris: Secondary | ICD-10-CM

## 2013-02-23 DIAGNOSIS — J45909 Unspecified asthma, uncomplicated: Secondary | ICD-10-CM

## 2013-02-23 LAB — BASIC METABOLIC PANEL
BUN: 19 mg/dL (ref 6–23)
BUN: 19 mg/dL (ref 6–23)
CO2: 25 mEq/L (ref 19–32)
CO2: 27 mEq/L (ref 19–32)
Calcium: 8.7 mg/dL (ref 8.4–10.5)
Calcium: 9 mg/dL (ref 8.4–10.5)
Chloride: 103 mEq/L (ref 96–112)
Chloride: 104 mEq/L (ref 96–112)
Creatinine, Ser: 0.86 mg/dL (ref 0.50–1.35)
Creatinine, Ser: 0.91 mg/dL (ref 0.50–1.35)
GFR calc Af Amer: >90 mL/min (ref >90)
GFR calc Af Amer: >90 mL/min (ref >90)
GFR calc non Af Amer: >90 mL/min (ref >90)
GFR calc non Af Amer: >90 mL/min (ref >90)
Glucose, Bld: 127 mg/dL — ABNORMAL HIGH (ref 70–99)
Glucose, Bld: 153 mg/dL — ABNORMAL HIGH (ref 70–99)
Potassium: 3.8 mEq/L (ref 3.5–5.1)
Potassium: 4 mEq/L (ref 3.5–5.1)
Sodium: 138 mEq/L (ref 135–145)
Sodium: 138 mEq/L (ref 135–145)

## 2013-02-23 LAB — CBC
HCT: 38.9 % — ABNORMAL LOW (ref 39.0–52.0)
HCT: 40.6 % (ref 39.0–52.0)
Hemoglobin: 13.8 g/dL (ref 13.0–17.0)
Hemoglobin: 14.2 g/dL (ref 13.0–17.0)
MCH: 29.6 pg (ref 26.0–34.0)
MCH: 30.2 pg (ref 26.0–34.0)
MCHC: 35 g/dL (ref 30.0–36.0)
MCHC: 35.5 g/dL (ref 30.0–36.0)
MCV: 84.8 fL (ref 78.0–100.0)
MCV: 85.1 fL (ref 78.0–100.0)
Platelets: 176 10*3/uL (ref 150–400)
Platelets: 193 10*3/uL (ref 150–400)
RBC: 4.57 MIL/uL (ref 4.22–5.81)
RBC: 4.79 MIL/uL (ref 4.22–5.81)
RDW: 13 % (ref 11.5–15.5)
RDW: 13.1 % (ref 11.5–15.5)
WBC: 7.3 10*3/uL (ref 4.0–10.5)
WBC: 7.6 10*3/uL (ref 4.0–10.5)

## 2013-02-23 LAB — POCT I-STAT TROPONIN I: Troponin i, poc: 0 ng/mL (ref 0.00–0.08)

## 2013-02-23 LAB — TROPONIN I
Troponin I: <0.3 ng/mL (ref <0.30)
Troponin I: <0.3 ng/mL (ref <0.30)
Troponin I: <0.3 ng/mL (ref <0.30)
Troponin I: <0.3 ng/mL (ref <0.30)

## 2013-02-23 LAB — LIPID PANEL
Cholesterol: 169 mg/dL (ref 0–200)
HDL: 42 mg/dL (ref >39)
LDL Cholesterol: 50 mg/dL (ref 0–99)
Total CHOL/HDL Ratio: 4 RATIO
Triglycerides: 383 mg/dL — ABNORMAL HIGH (ref <150)
VLDL: 77 mg/dL — ABNORMAL HIGH (ref 0–40)

## 2013-02-23 LAB — MRSA PCR SCREENING: MRSA by PCR: NEGATIVE

## 2013-02-23 MED ORDER — NITROGLYCERIN 0.4 MG SL SUBL: 0.4000 mg | SUBLINGUAL_TABLET | SUBLINGUAL | Status: DC | PRN

## 2013-02-23 MED ORDER — OXYCODONE HCL 5 MG PO TABS: 5.0000 mg | ORAL_TABLET | ORAL | Status: DC | PRN

## 2013-02-23 MED ORDER — EZETIMIBE 10 MG PO TABS
10.0000 mg | ORAL_TABLET | Freq: Every day | ORAL | Status: DC
  Administered 2013-02-23 – 2013-02-24 (×2): 10 mg via ORAL
  Filled 2013-02-23 (×3): qty 1

## 2013-02-23 MED ORDER — ZOLPIDEM TARTRATE 5 MG PO TABS
5.0000 mg | ORAL_TABLET | Freq: Every evening | ORAL | Status: DC | PRN
  Filled 2013-02-23: qty 1

## 2013-02-23 MED ORDER — IPRATROPIUM BROMIDE 0.02 % IN SOLN
0.5000 mg | RESPIRATORY_TRACT | Status: DC
  Administered 2013-02-23: 0.5 mg via RESPIRATORY_TRACT
  Filled 2013-02-23: qty 2.5

## 2013-02-23 MED ORDER — ASPIRIN EC 325 MG PO TBEC
325.0000 mg | DELAYED_RELEASE_TABLET | Freq: Every day | ORAL | Status: DC
  Administered 2013-02-23 – 2013-02-24 (×2): 325 mg via ORAL
  Filled 2013-02-23 (×3): qty 1

## 2013-02-23 MED ORDER — ALBUTEROL SULFATE (5 MG/ML) 0.5% IN NEBU: 2.5000 mg | INHALATION_SOLUTION | RESPIRATORY_TRACT | Status: DC | PRN

## 2013-02-23 MED ORDER — SIMVASTATIN 10 MG PO TABS
10.0000 mg | ORAL_TABLET | Freq: Every day | ORAL | Status: DC
  Administered 2013-02-23 – 2013-02-24 (×2): 10 mg via ORAL
  Filled 2013-02-23 (×3): qty 1

## 2013-02-23 MED ORDER — ACETAMINOPHEN 325 MG PO TABS
650.0000 mg | ORAL_TABLET | Freq: Four times a day (QID) | ORAL | Status: DC | PRN
  Administered 2013-02-23 – 2013-02-24 (×2): 650 mg via ORAL
  Filled 2013-02-23: qty 2

## 2013-02-23 MED ORDER — FLUTICASONE PROPIONATE 50 MCG/ACT NA SUSP
1.0000 | Freq: Every day | NASAL | Status: DC
  Administered 2013-02-23 – 2013-02-24 (×2): 1 via NASAL
  Filled 2013-02-23 (×2): qty 16

## 2013-02-23 MED ORDER — ALBUTEROL SULFATE (5 MG/ML) 0.5% IN NEBU: 2.5000 mg | INHALATION_SOLUTION | RESPIRATORY_TRACT | Status: DC

## 2013-02-23 MED ORDER — ENOXAPARIN SODIUM 40 MG/0.4ML ~~LOC~~ SOLN
40.0000 mg | SUBCUTANEOUS | Status: DC
  Administered 2013-02-23: 40 mg via SUBCUTANEOUS
  Filled 2013-02-23 (×3): qty 0.4

## 2013-02-23 MED ORDER — ALUM & MAG HYDROXIDE-SIMETH 200-200-20 MG/5ML PO SUSP
30.0000 mL | Freq: Four times a day (QID) | ORAL | Status: DC | PRN
  Filled 2013-02-23: qty 30

## 2013-02-23 MED ORDER — MORPHINE SULFATE 4 MG/ML IJ SOLN
4.0000 mg | Freq: Once | INTRAMUSCULAR | Status: AC
  Administered 2013-02-23: 4 mg via INTRAVENOUS
  Filled 2013-02-23: qty 1

## 2013-02-23 MED ORDER — SODIUM CHLORIDE 0.9 % IJ SOLN
3.0000 mL | Freq: Two times a day (BID) | INTRAMUSCULAR | Status: DC
  Administered 2013-02-23 – 2013-02-24 (×2): 3 mL via INTRAVENOUS

## 2013-02-23 MED ORDER — SODIUM CHLORIDE 0.9 % IV SOLN: INTRAVENOUS | Status: DC

## 2013-02-23 MED ORDER — LORATADINE 10 MG PO TABS
10.0000 mg | ORAL_TABLET | Freq: Every day | ORAL | Status: DC | PRN
  Filled 2013-02-23 (×2): qty 1

## 2013-02-23 MED ORDER — NITROGLYCERIN 2 % TD OINT
0.5000 [in_us] | TOPICAL_OINTMENT | Freq: Four times a day (QID) | TRANSDERMAL | Status: DC
  Administered 2013-02-23 – 2013-02-24 (×5): 0.5 [in_us] via TOPICAL
  Filled 2013-02-23: qty 30

## 2013-02-23 MED ORDER — ALBUTEROL SULFATE (5 MG/ML) 0.5% IN NEBU
2.5000 mg | INHALATION_SOLUTION | RESPIRATORY_TRACT | Status: DC
  Administered 2013-02-23: 2.5 mg via RESPIRATORY_TRACT
  Filled 2013-02-23: qty 0.5

## 2013-02-23 MED ORDER — ONDANSETRON HCL 4 MG PO TABS: 4.0000 mg | ORAL_TABLET | Freq: Four times a day (QID) | ORAL | Status: DC | PRN

## 2013-02-23 MED ORDER — PANTOPRAZOLE SODIUM 40 MG PO TBEC
40.0000 mg | DELAYED_RELEASE_TABLET | Freq: Every day | ORAL | Status: DC
  Administered 2013-02-23 – 2013-02-24 (×2): 40 mg via ORAL
  Filled 2013-02-23 (×2): qty 1

## 2013-02-23 MED ORDER — ONDANSETRON HCL 4 MG/2ML IJ SOLN: 4.0000 mg | Freq: Four times a day (QID) | INTRAMUSCULAR | Status: DC | PRN

## 2013-02-23 MED ORDER — METOPROLOL SUCCINATE ER 50 MG PO TB24
50.0000 mg | ORAL_TABLET | Freq: Every day | ORAL | Status: DC
  Administered 2013-02-23 – 2013-02-24 (×2): 50 mg via ORAL
  Filled 2013-02-23 (×3): qty 1

## 2013-02-23 MED ORDER — EZETIMIBE-SIMVASTATIN 10-10 MG PO TABS: 1.0000 | ORAL_TABLET | Freq: Every day | ORAL | Status: DC

## 2013-02-23 MED ORDER — HYDROMORPHONE HCL PF 1 MG/ML IJ SOLN: 0.5000 mg | INTRAMUSCULAR | Status: DC | PRN

## 2013-02-23 MED ORDER — MAGNESIUM OXIDE 400 (241.3 MG) MG PO TABS
800.0000 mg | ORAL_TABLET | Freq: Every day | ORAL | Status: DC
  Administered 2013-02-23 – 2013-02-24 (×2): 800 mg via ORAL
  Filled 2013-02-23 (×3): qty 2

## 2013-02-23 MED ORDER — IPRATROPIUM BROMIDE 0.02 % IN SOLN: 0.5000 mg | RESPIRATORY_TRACT | Status: DC | PRN

## 2013-02-23 MED ORDER — ACETAMINOPHEN 650 MG RE SUPP: 650.0000 mg | Freq: Four times a day (QID) | RECTAL | Status: DC | PRN

## 2013-02-23 MED ORDER — ONDANSETRON HCL 4 MG/2ML IJ SOLN
4.0000 mg | Freq: Once | INTRAMUSCULAR | Status: AC
  Administered 2013-02-23: 4 mg via INTRAVENOUS
  Filled 2013-02-23: qty 2

## 2013-02-23 NOTE — Progress Notes (Signed)
Pt has had no c/o chest pain or discomfort today

## 2013-02-23 NOTE — Discharge Summary (Addendum)
Physician Discharge Summary  [name] ZOX:[id number] DOB: 17-May-1956 DOA: 02/22/2013  PCP: No primary provider on file.  Admit date: 02/22/2013 Discharge date: 02/23/2013  Time spent: 30 minutes  Recommendations for Outpatient Follow-up:  1. Chest pain; cardiac enzymes x3 negative, echocardiogram not concerning for an acute event, patient to see cardiologist for nuclear stress test as outpatient. 2. HTN ; currently within ACA guidelines 3. HLD; lipid panel currently within NCEP guidelines except for high TG. Patient did discuss with PCP initiation of niacin.   4. Asthma; per patient and wife rarely uses rescue inhaler except during spring or fall. Does not recall last time required inhaler use   Discharge Diagnoses:  Principal Problem:   Chest pain Active Problems:   DYSLIPIDEMIA   CORONARY ARTERY DISEASE   Seasonal and perennial allergic rhinitis   Allergic-infective asthma   G E R D   Hypertension       Discharge Condition: Stable  Diet recommendation: Heart healthy  Filed Weights   02/23/13 0515  Weight: 93.441 kg (206 lb)    History of present illness:  [name] is a 57 y.o. WM PMHx CAD/S/p CABG February 2005 (per patient never had cardiac echo), HTN, HLD, asthma presenting to the ED with complaints of 8/10 pressure-like chest pain that radiated into the left arm, positive diaphoresis, positive nausea that lasted 2 hours, increasing over that time. No inciting event  (started while sitting) negative vomiting or dizziness or syncope. The pain started at around 10 pm, and he took 4 (81 mg) Aspirin at home as well as 2 SL NTG without relief, and in the ED he had 3 SL more NTGs, but the pain was not relieved until after he had been given IV Morphine. He was evaluated in the ED and his initial workup was negative and he was referred for medical admission for a cardiac rule out. His Cardiologist is Dr. Donnie Aho. Today cardiac enzymes x3  negative. States negative chest pain,  negative shortness of breath overnight resting comfortably in bed. Ready for discharge    Procedures:  CXR 02/23/2013 Hypoaeration without focal pulmonary opacity  Cardiac Echo 02/24/2013 - Left ventricle: The cavity size was normal. Systolic function was probably normal. The estimated ejection fraction was in the range of 50% to 55%. - Left atrium: The atrium was mildly dilated. - Right ventricle: The cavity size was mildly dilated. Wall thickness was normal.  .   Discharge Exam: Filed Vitals:   02/23/13 0430 02/23/13 0500 02/23/13 0515 02/23/13 0927  BP: 89/45 112/64 115/65   Pulse: 61 64 59   Temp:   97.8 F (36.6 C)   TempSrc:   Oral   Resp: 19 19 18    Height:   5\' 8"  (1.727 m)   Weight:   93.441 kg (206 lb)   SpO2: 97% 97% 97% 98%    General: Alert and oriented x4, NAD Cardiovascular: Regular rhythm and rate, negative murmurs rubs or gallops, PT/DP pulses 2+ bilateral Respiratory: Clear to auscultation bilateral  Discharge Instructions   Future Appointments Provider Department Dept Phone   05/10/2013 3:45 PM Waymon Budge, MD [facility] [phone]       Medication List    ASK your doctor about these medications       albuterol 108 (90 BASE) MCG/ACT inhaler  Commonly known as:  PROVENTIL HFA  Inhale 2 puffs into the lungs every 6 (six) hours as needed for wheezing or shortness of breath.  aspirin 325 MG tablet  Take 325 mg by mouth daily.     budesonide 32 MCG/ACT nasal spray  Commonly known as:  RHINOCORT AQUA  Place 2 sprays into the nose at bedtime.     budesonide-formoterol 160-4.5 MCG/ACT inhaler  Commonly known as:  SYMBICORT  Inhale 2 puffs into the lungs 2 (two) times daily.     EPIPEN 0.3 mg/0.3 mL Soaj  Generic drug:  EPINEPHrine  Inject 0.3 mg into the muscle once as needed (for allergic reaction).     esomeprazole 20 MG capsule  Commonly known as:  NEXIUM  Take 1 capsule (20 mg total) by mouth daily.      ipratropium-albuterol 0.5-2.5 (3) MG/3ML Soln  Commonly known as:  DUONEB  Take 3 mLs by nebulization every 6 (six) hours as needed.     loratadine 10 MG tablet  Commonly known as:  CLARITIN  Take 10 mg by mouth daily as needed for allergies.     magnesium oxide 400 MG tablet  Commonly known as:  MAG-OX  Take 800 mg by mouth daily.     metoprolol succinate 50 MG 24 hr tablet  Commonly known as:  TOPROL-XL  Take 50 mg by mouth daily.     nitroGLYCERIN 0.4 MG SL tablet  Commonly known as:  NITROSTAT  Place 0.4 mg under the tongue every 5 (five) minutes as needed.     traZODone 100 MG tablet  Commonly known as:  DESYREL  Take 1 tablet (100 mg total) by mouth at bedtime.     VYTORIN 10-10 MG per tablet  Generic drug:  ezetimibe-simvastatin  Take 1 tablet by mouth daily.       Allergies  Allergen Reactions  . Niacin And Related Other (See Comments)    unknown  . Penicillins Other (See Comments)    Unknown       The results of significant diagnostics from this hospitalization (including imaging, microbiology, ancillary and laboratory) are listed below for reference.    Significant Diagnostic Studies: Dg Chest 2 View  02/23/2013   *RADIOLOGY REPORT*  Clinical Data: Left-sided chest pain  CHEST - 2 VIEW  Comparison: 04/16/2012  Findings: Evidence of CABG noted. Lung volumes are low with crowding of the bronchovascular markings.  No focal pulmonary opacity.  No pleural effusion.  No acute osseous finding.  IMPRESSION: Hypoaeration without focal pulmonary opacity.   Original Report Authenticated By: Christiana Pellant, M.D.    Microbiology: Recent Results (from the past 240 hour(s))  MRSA PCR SCREENING     Status: None   Collection Time    02/23/13  6:57 AM      Result Value Range Status   MRSA by PCR NEGATIVE  NEGATIVE Final   Comment:            The GeneXpert MRSA Assay (FDA     approved for NASAL specimens     only), is one component of a     comprehensive MRSA  colonization     surveillance program. It is not     intended to diagnose MRSA     infection nor to guide or     monitor treatment for     MRSA infections.     Labs: Basic Metabolic Panel:  Recent Labs Lab 02/23/13 0004 02/23/13 0500  NA 138 138  K 3.8 4.0  CL 103 104  CO2 25 27  GLUCOSE 153* 127*  BUN 19 19  CREATININE 0.91 0.86  CALCIUM 9.0 8.7  Liver Function Tests: No results found for this basename: AST, ALT, ALKPHOS, BILITOT, PROT, ALBUMIN,  in the last 168 hours No results found for this basename: LIPASE, AMYLASE,  in the last 168 hours No results found for this basename: AMMONIA,  in the last 168 hours CBC:  Recent Labs Lab 02/23/13 0004 02/23/13 0500  WBC 7.6 7.3  HGB 14.2 13.8  HCT 40.6 38.9*  MCV 84.8 85.1  PLT 193 176   Cardiac Enzymes:  Recent Labs Lab 02/23/13 0500 02/23/13 1205  TROPONINI <0.30 <0.30   BNP: BNP (last 3 results) No results found for this basename: PROBNP,  in the last 8760 hours CBG: No results found for this basename: GLUCAP,  in the last 168 hours     Signed:  Carolyne Littles, J  [facility] 02/23/2013, 2:14 PM

## 2013-02-23 NOTE — Progress Notes (Signed)
MD,  Pt uses symbicort 1x daily at home, pt would like to have this med while in the hospital.  Please order if you feel appropriate.  Thank you.

## 2013-02-23 NOTE — Progress Notes (Signed)
Patient resting quietly with no C/O pain.

## 2013-02-23 NOTE — ED Notes (Signed)
Per EMS: Pt from home, resting with sudden onset of left sided 8/10 chest pressure. Pt took 325 aspirin and 2 nitro prior to arrival with no relief. On arrival pt given 3 more nitro with no relief. Pt reporting nausea, diaphoresis. Hx: 5 MI's, 6 CABG's. 116/70. 110 ST ( EKG unremarkable)/ 93% RA.

## 2013-02-23 NOTE — Progress Notes (Signed)
Utilization review complete 
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
## 2013-02-23 NOTE — Progress Notes (Signed)
Still awaiting am meds to be delivered. Pharmacy notified earlier and called.

## 2013-02-23 NOTE — ED Notes (Signed)
Pt sleeping, arousable to voice, NAD, calm, interactive, wife at [facility], denies pain, admits to some mild nausea and mild sob. VSS. 99% 2L [place].
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
## 2013-02-23 NOTE — ED Notes (Signed)
Admitting MD Dr. Lovell Sheehan at [facility].

## 2013-02-23 NOTE — ED Notes (Signed)
MD Glick at bedside. 

## 2013-02-24 LAB — HEMOGLOBIN A1C
Hgb A1c MFr Bld: 5.7 % — ABNORMAL HIGH (ref <5.7)
Mean Plasma Glucose: 117 mg/dL — ABNORMAL HIGH (ref <117)

## 2013-02-24 NOTE — Progress Notes (Signed)
*  PRELIMINARY RESULTS* Echocardiogram 2D Echocardiogram has been performed.  [name] 02/24/2013, 9:14 AM

## 2013-03-06 ENCOUNTER — Ambulatory Visit: Payer: Self-pay | Admitting: Specialist

## 2013-03-07 LAB — PATHOLOGY REPORT

## 2013-04-26 ENCOUNTER — Other Ambulatory Visit: Payer: Self-pay | Admitting: Internal Medicine

## 2013-04-29 NOTE — Telephone Encounter (Signed)
Please advise if you are okay refilling this RX again; or would you rather it go through his PCP? Thanks.

## 2013-04-30 NOTE — Telephone Encounter (Signed)
Ok to refill 

## 2013-05-05 ENCOUNTER — Other Ambulatory Visit: Payer: Self-pay | Admitting: Internal Medicine

## 2013-05-10 ENCOUNTER — Ambulatory Visit (INDEPENDENT_AMBULATORY_CARE_PROVIDER_SITE_OTHER): Payer: BC Managed Care – PPO | Admitting: Internal Medicine

## 2013-05-10 ENCOUNTER — Encounter: Payer: Self-pay | Admitting: Internal Medicine

## 2013-05-10 VITALS — BP 124/60 | HR 79 | Ht 68.0 in | Wt 211.6 lb

## 2013-05-10 DIAGNOSIS — J302 Other seasonal allergic rhinitis: Secondary | ICD-10-CM

## 2013-05-10 DIAGNOSIS — J309 Allergic rhinitis, unspecified: Secondary | ICD-10-CM

## 2013-05-10 DIAGNOSIS — J40 Bronchitis, not specified as acute or chronic: Secondary | ICD-10-CM

## 2013-05-10 DIAGNOSIS — K219 Gastro-esophageal reflux disease without esophagitis: Secondary | ICD-10-CM

## 2013-05-10 MED ORDER — ESOMEPRAZOLE MAGNESIUM 20 MG PO CPDR: DELAYED_RELEASE_CAPSULE | ORAL | Status: DC

## 2013-05-10 MED ORDER — BUDESONIDE-FORMOTEROL FUMARATE 160-4.5 MCG/ACT IN AERO: 2.0000 | INHALATION_SPRAY | Freq: Two times a day (BID) | RESPIRATORY_TRACT | Status: DC

## 2013-05-10 MED ORDER — BUDESONIDE 32 MCG/ACT NA SUSP: 2.0000 | Freq: Every day | NASAL | Status: DC

## 2013-05-10 NOTE — Progress Notes (Signed)
05/29/11- 58 year old male former smoker followed for asthma, allergic rhinitis complicated by CAD, GERD.... wife here Last here- 10/13/2010 He had done well through the summer the last 2 or 3 days notes increasing sinus and chest congestion cough and thick white or trace yellow sputum. He denies headache, fever, sore throat. Coughing is making bilateral rib cage sore.  01/04/12- 57 year old male former smoker followed for asthma, allergic rhinitis complicated by CAD, GERD.... wife here Patient states about the same as last visit.  Pretty good spring with no acute issues until today. He woke this morning with some frontal headache, worse leaning over, little nasal discharge. Denies sore throat or sneeze. Ears okay. No cough or wheeze.  04/16/12- 57 year old male former smoker followed for asthma, allergic rhinitis complicated by CAD, GERD.... wife here ACUTE VISIT: cough since 03-06-12;dry hacky cough-has had 2 rounds of Zithromax, 1 prednisone taper and cough syrup-no better. Hard cough all summer, since at least mid July. Occasionally chokes while eating. Persistent sinus drip. Coughed triggers heartburn despite Nexium and we again discussed cyclical cough and the importance of controlling GERD. Short of breath at rest some upper anterior chest wall pressure which is nonexertional. Nebulizer treatments helps some.  09/18/12- 57 year old male former smoker followed for asthma, allergic rhinitis complicated by CAD, GERD.... wife here FOLLOWS FOR: dry hacky cough; only during the day; insurance will not cover Tussionex-needs different cough syrup Same dry cough since July with no acute event. Nebulizer does help using new duoneb. Frontal headache, always stuffy, scant clear nasal discharge. Denies any reflux or aspiration event. Medications reviewed. CXR 04/17/12-  IMPRESSION:  Status post CABG. No active disease.  Original Report Authenticated By: Natasha Mead, M.D.  11/01/12- 57 year old male former  smoker followed for asthma, allergic rhinitis complicated by CAD, GERD.... wife here FOLLOWS FOR: cough went away but came back about 2 days ago-dry and hacky cough.Wheezing, SOB as well. Felt well after neb/ depo LOV, until past week. C/o incr head and chest congestion, post-nasal drip, chest tight. Denies a cold or fever. Using his neb 1x daily, Symbicort, Rhinocort.  05/10/13- 57 year old male former smoker followed for asthma, allergic rhinitis complicated by CAD, GERD.... wife here FOLLOWS FOR: denies any flare up Has done well through the summer and now early fall with no exacerbation. We discussed medications and vaccination CXR 02/22/13 IMPRESSION:  Hypoaeration without focal pulmonary opacity.  Original Report Authenticated By: Christiana Pellant, M.D.  ROS-HPI Constitutional:   No-   weight loss, night sweats, fevers, chills, fatigue, lassitude. HEENT:   No-  headaches, difficulty swallowing, tooth/dental problems,  No-sore throat,       No-sneezing, itching, ear ache, nasal congestion, no-post nasal drip,  CV:  No-  Anginal  chest pain, orthopnea, PND, swelling in lower extremities, anasarca, dizziness, palpitations Resp: +   shortness of breath with exertion or at rest.              No- productive cough,  No-non-productive cough,  No- coughing up of blood.              No-   change in color of mucus.  No- wheezing.   Skin: No-   rash or lesions. GI:  No-heartburn, indigestion, abdominal pain, nausea, vomiting,  GU:  MS:  No-   joint pain or swelling.  Neuro-     nothing unusual Psych:  No- change in mood or affect. No depression or anxiety.  No memory loss.  OBJ General- Alert, Oriented, Affect-appropriate, Distress- none acute,  laconic, obese Skin- rash-none, lesions- none, excoriation- none Lymphadenopathy- none Head- atraumatic            Eyes- Gross vision intact, PERRLA, conjunctivae clear secretions            Ears- Hearing, canals-normal            Nose- +thick mucus  both nostrils, no-Septal dev, polyps, erosion, perforation             Throat- Mallampati II-III , mucosa clear , drainage- none, tonsils- atrophic Neck- flexible , trachea midline, no stridor , thyroid nl, carotid no bruit Chest - symmetrical excursion , unlabored           Heart/CV- RRR , no murmur , no gallop  , no rub, nl s1 s2                           - JVD- none , edema- none, stasis changes- none, varices- none           Lung- clear,  dullness-none, rub- none, no cough or wheeze           Chest wall-  Abd-  Br/ Gen/ Rectal- Not done, not indicated Extrem- cyanosis- none, clubbing, none, atrophy- none, strength- nl Neuro- grossly intact to observation

## 2013-05-10 NOTE — Patient Instructions (Addendum)
Refills sent for Rhinocort nasal spray, Symbicort and Nexium  Flu vax  Please call as needed

## 2013-05-20 NOTE — Assessment & Plan Note (Signed)
Chronic bronchitis, currently controlled Plan-flu vaccine

## 2013-05-20 NOTE — Assessment & Plan Note (Signed)
Adequate control. Plan-refill Rhinocort with discussion

## 2013-05-20 NOTE — Assessment & Plan Note (Signed)
Nexium is sufficient for control as long as he sticks with it. Reflux precautions reviewed. Plan-refill Nexium

## 2013-06-03 ENCOUNTER — Telehealth: Payer: Self-pay | Admitting: Internal Medicine

## 2013-06-03 MED ORDER — HYDROCOD POLST-CHLORPHEN POLST 10-8 MG/5ML PO LQCR: 5.0000 mL | Freq: Two times a day (BID) | ORAL | Status: DC | PRN

## 2013-06-03 MED ORDER — AZITHROMYCIN 250 MG PO TABS: ORAL_TABLET | ORAL | Status: DC

## 2013-06-03 NOTE — Telephone Encounter (Signed)
Pt spouse aware of recs. RX for ZPAK sent and tussionex printed out. She will pick this up. Nothing further needed

## 2013-06-03 NOTE — Telephone Encounter (Signed)
lmtcb x1 

## 2013-06-03 NOTE — Telephone Encounter (Signed)
Per CY-okay to give patient Zpak #1 take as directed no refills and Tussionex #2ml 1 tsp every 12 hours prn no refills. Thanks.   Also, I spoke with patients wife the day of patients appointment about using MyChart and that ALL message go through Triage first no matter what-this is the protocol.

## 2013-06-03 NOTE — Telephone Encounter (Signed)
Pt's spouse returned call.  [name] ° °

## 2013-06-03 NOTE — Telephone Encounter (Signed)
I spoke with spouse. She reports pt has a dry cough, chest congestion, wheezing, chest tx, slight increase SOB. No fever, no chills, no sweats. Requesting to have ZPAK called in and would like to p/u RX for tussionex. She has been giving pt his breathing tx's. Please advise Dr. Maple Hudson thanks Last OV 05/10/13 Pending 11/08/13 Allergies  Allergen Reactions  . Niacin And Related Other (See Comments)    unknown  . Penicillins Other (See Comments)    Unknown

## 2013-06-26 ENCOUNTER — Emergency Department: Payer: Self-pay | Admitting: Emergency Medicine

## 2013-07-01 ENCOUNTER — Emergency Department: Payer: Self-pay | Admitting: Emergency Medicine

## 2013-07-15 ENCOUNTER — Telehealth: Payer: Self-pay | Admitting: Internal Medicine

## 2013-07-15 NOTE — Telephone Encounter (Signed)
lmomtcb x1 for pt 

## 2013-07-15 NOTE — Telephone Encounter (Signed)
Spoke with pt's wife.  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  Requesting tussionex rx.  Rx was last called in on 06/03/13 for # 200 mL x 0.  Wife reports the bottles come in 115 mL, and they can no longer break the bottles.  Requesting rx to be for either or 230 mL.  Dr. Maple Hudson, pls advise if this is ok.  Thank you.  ** Wife requesting rx to be mailed to verified home address.

## 2013-07-16 MED ORDER — HYDROCOD POLST-CHLORPHEN POLST 10-8 MG/5ML PO LQCR: 5.0000 mL | Freq: Two times a day (BID) | ORAL | Status: DC | PRN

## 2013-07-16 NOTE — Telephone Encounter (Signed)
Spouse aware RX printed and placed in the mail. Nothing further needed

## 2013-07-16 NOTE — Telephone Encounter (Signed)
Ok to change script for tussionex to 115 ml, 1 tsp every 12 hours if needed for cough.

## 2013-07-25 ENCOUNTER — Telehealth: Payer: Self-pay | Admitting: Internal Medicine

## 2013-07-25 MED ORDER — FLUTICASONE PROPIONATE 50 MCG/ACT NA SUSP: 2.0000 | Freq: Every day | NASAL | Status: DC

## 2013-07-25 NOTE — Telephone Encounter (Signed)
This rx for flonase has been sent to [facility] in [place] and detailed message left for pt that this has been done at her request. Nothing further is needed

## 2013-07-25 NOTE — Telephone Encounter (Signed)
Ok to d/c Rhinocort and replace with Rx for Flonase, # 3, 1-2 puffs each nostril once daily at bedtime, ref x 3

## 2013-07-25 NOTE — Telephone Encounter (Signed)
Pt requesting could she switch from rhinocort to flonase and if so could we send in 3 month rx for this. Please advise CDY if this is ok.  thanks

## 2013-07-25 NOTE — Telephone Encounter (Signed)
Pt requesting could she could flonase

## 2013-08-08 HISTORY — PX: SHOULDER SURGERY: SHX246

## 2013-09-05 ENCOUNTER — Telehealth: Payer: Self-pay | Admitting: Internal Medicine

## 2013-09-05 MED ORDER — AZITHROMYCIN 250 MG PO TABS: ORAL_TABLET | ORAL | Status: DC

## 2013-09-05 NOTE — Telephone Encounter (Signed)
RX has been sent Left detailed message as well rx has been sent. Will sign off message

## 2013-09-05 NOTE — Telephone Encounter (Signed)
Ok to offer Z pak with 5 refills

## 2013-09-05 NOTE — Telephone Encounter (Signed)
I called and spoke with spouse. She reports she was told anytime pt needed a ZPAK to call and ask for [facility] and this would be taken care of and reports she is tired of doing this routine every time she call for pt. I advised her it is our protocol to call and see what is going on and get the symptoms. She reports it is the same thing every time that goes on with pt that Katie and Dr. Maple Hudson knows about. She reports he is having chest congestion, dry cough. Denies any no wheezing, no chest tx, no nausea,no vomiting, no body aches. Pt does not take anything OTC. Please advise Dr. Maple Hudson thanks Last OV 05/10/13 Pending 11/08/13 Allergies  Allergen Reactions  . Niacin And Related Other (See Comments)    unknown  . Penicillins Other (See Comments)    Unknown     Current Outpatient Prescriptions on File Prior to Visit  Medication Sig Dispense Refill  . albuterol (PROVENTIL HFA) 108 (90 BASE) MCG/ACT inhaler Inhale 2 puffs into the lungs every 6 (six) hours as needed for wheezing or shortness of breath.  1 Inhaler  prn  . aspirin 325 MG tablet Take 325 mg by mouth daily.        Marland Kitchen azithromycin (ZITHROMAX) 250 MG tablet Take as directed  6 tablet  0  . budesonide (RHINOCORT AQUA) 32 MCG/ACT nasal spray Place 2 sprays into the nose at bedtime.  3 Bottle  3  . budesonide-formoterol (SYMBICORT) 160-4.5 MCG/ACT inhaler Inhale 2 puffs into the lungs 2 (two) times daily. Rinse mouth  3 Inhaler  3  . chlorpheniramine-HYDROcodone (TUSSIONEX PENNKINETIC ER) 10-8 MG/5ML LQCR Take 5 mLs by mouth every 12 (twelve) hours as needed.  115 mL  0  . EPINEPHrine (EPIPEN) 0.3 mg/0.3 mL SOAJ Inject 0.3 mg into the muscle once as needed (for allergic reaction).      Marland Kitchen esomeprazole (NEXIUM) 20 MG capsule 1 daily before meal  30 capsule  prn  . ezetimibe-simvastatin (VYTORIN) 10-10 MG per tablet Take 1 tablet by mouth daily.        . fluticasone (FLONASE) 50 MCG/ACT nasal spray Place 2 sprays into both nostrils daily.  16 g  3  .  ipratropium-albuterol (DUONEB) 0.5-2.5 (3) MG/3ML SOLN Take 3 mLs by nebulization every 6 (six) hours as needed.  360 mL  prn  . loratadine (CLARITIN) 10 MG tablet Take 10 mg by mouth daily as needed for allergies.      . magnesium oxide (MAG-OX) 400 MG tablet Take 800 mg by mouth daily.        . metoprolol (TOPROL-XL) 50 MG 24 hr tablet Take 50 mg by mouth daily.        . nitroGLYCERIN (NITROSTAT) 0.4 MG SL tablet Place 0.4 mg under the tongue every 5 (five) minutes as needed.        . traZODone (DESYREL) 100 MG tablet Take 1 tablet (100 mg total) by mouth at bedtime.  90 tablet  3   No current facility-administered medications on file prior to visit.

## 2013-11-08 ENCOUNTER — Ambulatory Visit: Payer: BC Managed Care – PPO | Admitting: Internal Medicine

## 2013-11-15 ENCOUNTER — Other Ambulatory Visit: Payer: Self-pay | Admitting: Internal Medicine

## 2013-12-17 ENCOUNTER — Inpatient Hospital Stay (HOSPITAL_COMMUNITY)
Admission: EM | Admit: 2013-12-17 | Discharge: 2013-12-19 | DRG: 287 | Disposition: A | Discharge status: Home / Self Care | Payer: BC Managed Care – PPO | Attending: Cardiology | Admitting: Cardiology

## 2013-12-17 ENCOUNTER — Encounter (HOSPITAL_COMMUNITY): Payer: Self-pay | Admitting: Emergency Medicine

## 2013-12-17 ENCOUNTER — Emergency Department (HOSPITAL_COMMUNITY): Payer: BC Managed Care – PPO

## 2013-12-17 ENCOUNTER — Ambulatory Visit: Payer: BC Managed Care – PPO | Admitting: Internal Medicine

## 2013-12-17 ENCOUNTER — Other Ambulatory Visit: Payer: Self-pay | Admitting: Cardiology

## 2013-12-17 DIAGNOSIS — I2510 Atherosclerotic heart disease of native coronary artery without angina pectoris: Secondary | ICD-10-CM | POA: Diagnosis present

## 2013-12-17 DIAGNOSIS — J449 Chronic obstructive pulmonary disease, unspecified: Secondary | ICD-10-CM | POA: Diagnosis present

## 2013-12-17 DIAGNOSIS — Z888 Allergy status to other drugs, medicaments and biological substances status: Secondary | ICD-10-CM

## 2013-12-17 DIAGNOSIS — R739 Hyperglycemia, unspecified: Secondary | ICD-10-CM

## 2013-12-17 DIAGNOSIS — K219 Gastro-esophageal reflux disease without esophagitis: Secondary | ICD-10-CM | POA: Diagnosis present

## 2013-12-17 DIAGNOSIS — Z8249 Family history of ischemic heart disease and other diseases of the circulatory system: Secondary | ICD-10-CM

## 2013-12-17 DIAGNOSIS — I200 Unstable angina: Secondary | ICD-10-CM | POA: Diagnosis present

## 2013-12-17 DIAGNOSIS — Z6831 Body mass index (BMI) 31.0-31.9, adult: Secondary | ICD-10-CM

## 2013-12-17 DIAGNOSIS — E669 Obesity, unspecified: Secondary | ICD-10-CM | POA: Diagnosis present

## 2013-12-17 DIAGNOSIS — Z823 Family history of stroke: Secondary | ICD-10-CM

## 2013-12-17 DIAGNOSIS — R072 Precordial pain: Secondary | ICD-10-CM | POA: Diagnosis present

## 2013-12-17 DIAGNOSIS — I2582 Chronic total occlusion of coronary artery: Secondary | ICD-10-CM | POA: Diagnosis present

## 2013-12-17 DIAGNOSIS — I25810 Atherosclerosis of coronary artery bypass graft(s) without angina pectoris: Principal | ICD-10-CM | POA: Diagnosis present

## 2013-12-17 DIAGNOSIS — I10 Essential (primary) hypertension: Secondary | ICD-10-CM

## 2013-12-17 DIAGNOSIS — Z880 Allergy status to penicillin: Secondary | ICD-10-CM

## 2013-12-17 DIAGNOSIS — R7309 Other abnormal glucose: Secondary | ICD-10-CM | POA: Diagnosis present

## 2013-12-17 DIAGNOSIS — E785 Hyperlipidemia, unspecified: Secondary | ICD-10-CM | POA: Diagnosis present

## 2013-12-17 DIAGNOSIS — J4489 Other specified chronic obstructive pulmonary disease: Secondary | ICD-10-CM | POA: Diagnosis present

## 2013-12-17 DIAGNOSIS — Z87891 Personal history of nicotine dependence: Secondary | ICD-10-CM

## 2013-12-17 DIAGNOSIS — I498 Other specified cardiac arrhythmias: Secondary | ICD-10-CM | POA: Diagnosis present

## 2013-12-17 HISTORY — DX: Unspecified thoracic, thoracolumbar and lumbosacral intervertebral disc disorder: M51.9

## 2013-12-17 LAB — CBC WITH DIFFERENTIAL/PLATELET
Basophils Absolute: 0 10*3/uL (ref 0.0–0.1)
Basophils Relative: 1 % (ref 0–1)
Eosinophils Absolute: 0.2 10*3/uL (ref 0.0–0.7)
Eosinophils Relative: 3 % (ref 0–5)
HCT: 42.6 % (ref 39.0–52.0)
Hemoglobin: 15.2 g/dL (ref 13.0–17.0)
Lymphocytes Relative: 23 % (ref 12–46)
Lymphs Abs: 1.5 10*3/uL (ref 0.7–4.0)
MCH: 29.9 pg (ref 26.0–34.0)
MCHC: 35.7 g/dL (ref 30.0–36.0)
MCV: 83.9 fL (ref 78.0–100.0)
Monocytes Absolute: 0.5 10*3/uL (ref 0.1–1.0)
Monocytes Relative: 7 % (ref 3–12)
Neutro Abs: 4.4 10*3/uL (ref 1.7–7.7)
Neutrophils Relative %: 66 % (ref 43–77)
Platelets: 197 10*3/uL (ref 150–400)
RBC: 5.08 MIL/uL (ref 4.22–5.81)
RDW: 13 % (ref 11.5–15.5)
WBC: 6.5 10*3/uL (ref 4.0–10.5)

## 2013-12-17 LAB — HEPARIN LEVEL (UNFRACTIONATED): Heparin Unfractionated: 0.27 IU/mL — ABNORMAL LOW (ref 0.30–0.70)

## 2013-12-17 LAB — COMPREHENSIVE METABOLIC PANEL
ALT: 21 U/L (ref 0–53)
AST: 23 U/L (ref 0–37)
Albumin: 3.4 g/dL — ABNORMAL LOW (ref 3.5–5.2)
Alkaline Phosphatase: 60 U/L (ref 39–117)
BUN: 18 mg/dL (ref 6–23)
CO2: 24 mEq/L (ref 19–32)
Calcium: 8.8 mg/dL (ref 8.4–10.5)
Chloride: 105 mEq/L (ref 96–112)
Creatinine, Ser: 0.84 mg/dL (ref 0.50–1.35)
GFR calc Af Amer: >90 mL/min (ref >90)
GFR calc non Af Amer: >90 mL/min (ref >90)
Glucose, Bld: 95 mg/dL (ref 70–99)
Potassium: 4.4 mEq/L (ref 3.7–5.3)
Sodium: 141 mEq/L (ref 137–147)
Total Bilirubin: 0.3 mg/dL (ref 0.3–1.2)
Total Protein: 6.7 g/dL (ref 6.0–8.3)

## 2013-12-17 LAB — LIPID PANEL
Cholesterol: 146 mg/dL (ref 0–200)
HDL: 48 mg/dL (ref >39)
LDL Cholesterol: 39 mg/dL (ref 0–99)
Total CHOL/HDL Ratio: 3 RATIO
Triglycerides: 294 mg/dL — ABNORMAL HIGH (ref <150)
VLDL: 59 mg/dL — ABNORMAL HIGH (ref 0–40)

## 2013-12-17 LAB — HEMOGLOBIN A1C
Hgb A1c MFr Bld: 6.1 % — ABNORMAL HIGH (ref <5.7)
Mean Plasma Glucose: 128 mg/dL — ABNORMAL HIGH (ref <117)

## 2013-12-17 LAB — PROTIME-INR
INR: 0.95 (ref 0.00–1.49)
Prothrombin Time: 12.5 seconds (ref 11.6–15.2)

## 2013-12-17 LAB — TSH: TSH: 0.937 u[IU]/mL (ref 0.350–4.500)

## 2013-12-17 LAB — APTT: aPTT: 28 seconds (ref 24–37)

## 2013-12-17 LAB — MRSA PCR SCREENING: MRSA by PCR: NEGATIVE

## 2013-12-17 LAB — TROPONIN I
Troponin I: <0.3 ng/mL (ref <0.30)
Troponin I: <0.3 ng/mL (ref <0.30)

## 2013-12-17 MED ORDER — SODIUM CHLORIDE 0.9 % IV SOLN: 250.0000 mL | INTRAVENOUS | Status: DC | PRN

## 2013-12-17 MED ORDER — EZETIMIBE 10 MG PO TABS
10.0000 mg | ORAL_TABLET | Freq: Every day | ORAL | Status: DC
  Administered 2013-12-17 – 2013-12-18 (×2): 10 mg via ORAL
  Filled 2013-12-17 (×4): qty 1

## 2013-12-17 MED ORDER — IPRATROPIUM-ALBUTEROL 0.5-2.5 (3) MG/3ML IN SOLN: 3.0000 mL | Freq: Four times a day (QID) | RESPIRATORY_TRACT | Status: DC | PRN

## 2013-12-17 MED ORDER — TRAZODONE HCL 100 MG PO TABS
100.0000 mg | ORAL_TABLET | Freq: Every day | ORAL | Status: DC
  Administered 2013-12-17 – 2013-12-18 (×2): 100 mg via ORAL
  Filled 2013-12-17 (×4): qty 1

## 2013-12-17 MED ORDER — ONDANSETRON HCL 4 MG/2ML IJ SOLN
4.0000 mg | Freq: Four times a day (QID) | INTRAMUSCULAR | Status: DC | PRN
  Administered 2013-12-18: 20:00:00 4 mg via INTRAVENOUS
  Filled 2013-12-17: qty 2

## 2013-12-17 MED ORDER — ASPIRIN 81 MG PO CHEW
324.0000 mg | CHEWABLE_TABLET | ORAL | Status: AC
  Administered 2013-12-17: 324 mg via ORAL
  Filled 2013-12-17: qty 4

## 2013-12-17 MED ORDER — ACETAMINOPHEN 325 MG PO TABS
650.0000 mg | ORAL_TABLET | ORAL | Status: DC | PRN
Start: 2013-12-17 — End: 2013-12-18

## 2013-12-17 MED ORDER — PANTOPRAZOLE SODIUM 40 MG PO TBEC
80.0000 mg | DELAYED_RELEASE_TABLET | Freq: Every day | ORAL | Status: DC
  Administered 2013-12-18: 13:00:00 80 mg via ORAL
  Filled 2013-12-17: qty 2

## 2013-12-17 MED ORDER — LORATADINE 10 MG PO TABS
10.0000 mg | ORAL_TABLET | Freq: Every day | ORAL | Status: DC
  Administered 2013-12-17 – 2013-12-18 (×2): 10 mg via ORAL
  Filled 2013-12-17 (×4): qty 1

## 2013-12-17 MED ORDER — SODIUM CHLORIDE 0.9 % IJ SOLN
3.0000 mL | Freq: Two times a day (BID) | INTRAMUSCULAR | Status: DC
  Administered 2013-12-17: 3 mL via INTRAVENOUS

## 2013-12-17 MED ORDER — TIZANIDINE HCL 2 MG PO TABS
2.0000 mg | ORAL_TABLET | Freq: Four times a day (QID) | ORAL | Status: DC | PRN
  Filled 2013-12-17: qty 1

## 2013-12-17 MED ORDER — NITROGLYCERIN 0.4 MG SL SUBL
0.4000 mg | SUBLINGUAL_TABLET | SUBLINGUAL | Status: DC | PRN
  Administered 2013-12-18 (×2): 0.4 mg via SUBLINGUAL
  Filled 2013-12-17 (×3): qty 1

## 2013-12-17 MED ORDER — EPINEPHRINE 0.3 MG/0.3ML IJ SOAJ: 0.3000 mg | Freq: Once | INTRAMUSCULAR | Status: DC | PRN

## 2013-12-17 MED ORDER — SIMVASTATIN 10 MG PO TABS
10.0000 mg | ORAL_TABLET | Freq: Every day | ORAL | Status: DC
  Administered 2013-12-18: 10 mg via ORAL
  Filled 2013-12-17 (×3): qty 1

## 2013-12-17 MED ORDER — ASPIRIN EC 81 MG PO TBEC
81.0000 mg | DELAYED_RELEASE_TABLET | Freq: Every day | ORAL | Status: DC
  Administered 2013-12-18: 81 mg via ORAL
  Filled 2013-12-17 (×2): qty 1

## 2013-12-17 MED ORDER — ALBUTEROL SULFATE (2.5 MG/3ML) 0.083% IN NEBU: 2.5000 mg | INHALATION_SOLUTION | Freq: Four times a day (QID) | RESPIRATORY_TRACT | Status: DC | PRN

## 2013-12-17 MED ORDER — ASPIRIN EC 81 MG PO TBEC: 81.0000 mg | DELAYED_RELEASE_TABLET | Freq: Every day | ORAL | Status: DC

## 2013-12-17 MED ORDER — SODIUM CHLORIDE 0.9 % IJ SOLN: 3.0000 mL | INTRAMUSCULAR | Status: DC | PRN

## 2013-12-17 MED ORDER — HEPARIN BOLUS VIA INFUSION
4000.0000 [IU] | Freq: Once | INTRAVENOUS | Status: AC
  Administered 2013-12-17: 4000 [IU] via INTRAVENOUS
  Filled 2013-12-17: qty 4000

## 2013-12-17 MED ORDER — FLUTICASONE PROPIONATE 50 MCG/ACT NA SUSP
1.0000 | Freq: Every day | NASAL | Status: DC
  Administered 2013-12-18: 1 via NASAL
  Filled 2013-12-17 (×2): qty 16

## 2013-12-17 MED ORDER — ASPIRIN 300 MG RE SUPP: 300.0000 mg | RECTAL | Status: AC

## 2013-12-17 MED ORDER — HEPARIN (PORCINE) IN NACL 100-0.45 UNIT/ML-% IJ SOLN
1400.0000 [IU]/h | INTRAMUSCULAR | Status: DC
  Administered 2013-12-17: 1250 [IU]/h via INTRAVENOUS
  Administered 2013-12-18: 1400 [IU]/h via INTRAVENOUS
  Filled 2013-12-17 (×2): qty 250

## 2013-12-17 MED ORDER — MORPHINE SULFATE 2 MG/ML IJ SOLN
2.0000 mg | INTRAMUSCULAR | Status: DC | PRN
  Administered 2013-12-17 – 2013-12-18 (×2): 2 mg via INTRAVENOUS
  Filled 2013-12-17 (×2): qty 1

## 2013-12-17 MED ORDER — METOPROLOL SUCCINATE ER 50 MG PO TB24
50.0000 mg | ORAL_TABLET | Freq: Every day | ORAL | Status: DC
  Administered 2013-12-17 – 2013-12-18 (×2): 50 mg via ORAL
  Filled 2013-12-17 (×4): qty 1

## 2013-12-17 MED ORDER — EZETIMIBE-SIMVASTATIN 10-10 MG PO TABS: 1.0000 | ORAL_TABLET | Freq: Every day | ORAL | Status: DC

## 2013-12-17 MED ORDER — ALBUTEROL SULFATE HFA 108 (90 BASE) MCG/ACT IN AERS: 2.0000 | INHALATION_SPRAY | Freq: Four times a day (QID) | RESPIRATORY_TRACT | Status: DC | PRN

## 2013-12-17 NOTE — ED Notes (Signed)
Verified that heparin drip is running at correct rate.

## 2013-12-17 NOTE — Progress Notes (Signed)
ANTICOAGULATION CONSULT NOTE   Pharmacy Consult:  Heparin Indication: chest pain/ACS  Allergies  Allergen Reactions  . Niacin And Related Other (See Comments)    unknown  . Penicillins Other (See Comments)    "Makes me pass out"     Patient Measurements: Height: 5\' 8"  (172.7 cm) Weight: 210 lb (95.255 kg) IBW/kg (Calculated) : 68.4 Heparin Dosing Weight: 88 kg  Vital Signs: Temp: 97.8 F (36.6 C) (05/12 1243) Temp src: Oral (05/12 1243) BP: 140/76 mmHg (05/12 2159) Pulse Rate: 71 (05/12 2159)  Labs:  Recent Labs  12/17/13 1253 12/17/13 1958 12/17/13 2138  HGB 15.2  --   --   HCT 42.6  --   --   PLT 197  --   --   APTT 28  --   --   LABPROT 12.5  --   --   INR 0.95  --   --   HEPARINUNFRC  --   --  0.27*  CREATININE 0.84  --   --   TROPONINI <0.30 <0.30  --     Estimated Creatinine Clearance: 106.1 ml/min (by C-G formula based on Cr of 0.84).   Medical History: Past Medical History  Diagnosis Date  . GERD (gastroesophageal reflux disease)   . Asthma   . Allergic rhinitis   . Hypertension   . Hypercholesteremia   . Coronary artery disease   . Lumbar disc disease        Assessment: [name] presented with chest pain and Pharmacy consulted to manage IV heparin.  Patient denies being on blood thinners PTA.  Initial heparin level is 0.27 units/ml   Goal of Therapy:  Heparin level 0.3-0.7 units/ml Monitor platelets by anticoagulation protocol: Yes    Plan:  - Increase heparin gtt to 1400 units/hr - Check 6 hr HL - Daily HL / CBC  Talbert Cage, PharmD Pager:  [phone] 12/17/2013, 10:36 PM

## 2013-12-17 NOTE — ED Notes (Signed)
Attempted to call floor for report.

## 2013-12-17 NOTE — ED Notes (Signed)
Called ED Pharmacist to verify meds for administration.

## 2013-12-17 NOTE — ED Notes (Signed)
Called lab about patient's return from x-ray for troponin.

## 2013-12-17 NOTE — Progress Notes (Addendum)
ANTICOAGULATION CONSULT NOTE - Initial Consult  Pharmacy Consult:  Heparin Indication: chest pain/ACS  Allergies  Allergen Reactions  . Niacin And Related Other (See Comments)    unknown  . Penicillins Other (See Comments)    Unknown     Patient Measurements: Height: 5\' 8"  (172.7 cm) Weight: 210 lb (95.255 kg) IBW/kg (Calculated) : 68.4 Heparin Dosing Weight: 88 kg  Vital Signs: Temp: 97.8 F (36.6 C) (05/12 1243) Temp src: Oral (05/12 1243) BP: 136/74 mmHg (05/12 1243) Pulse Rate: 92 (05/12 1243)  Labs: No results found for this basename: HGB, HCT, PLT, APTT, LABPROT, INR, HEPARINUNFRC, CREATININE, CKTOTAL, CKMB, TROPONINI,  in the last 72 hours  Estimated Creatinine Clearance: 103.6 ml/min (by C-G formula based on Cr of 0.86).   Medical History: Past Medical History  Diagnosis Date  . GERD (gastroesophageal reflux disease)   . Asthma   . Allergic rhinitis   . Hypertension   . Hypercholesteremia   . Coronary artery disease        Assessment: 49 YOM presented with chest pain and Pharmacy consulted to manage IV heparin.  Patient denies being on blood thinners PTA.  Baseline labs pending collection.   Goal of Therapy:  Heparin level 0.3-0.7 units/ml Monitor platelets by anticoagulation protocol: Yes    Plan:  - Heparin 4000 units IV bolus x 1, then - Heparin gtt at 1250 units/hr (start after baseline labs are drawn, RN aware) - Check 6 hr HL - Daily HL / CBC    Gracie Gupta D. Laney Potash, PharmD, BCPS Pager:  [phone] 12/17/2013, 1:55 PM

## 2013-12-17 NOTE — ED Notes (Signed)
Pt states he has been having chest pain and pressure for a couple of days.  Pt saw Dr. Donnie Aho and was told to come here and he will be waiting on admission bed to become available

## 2013-12-17 NOTE — ED Notes (Signed)
Cardiac diet tray ordered at this time.
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
## 2013-12-17 NOTE — ED Notes (Signed)
Heparin dosage verified with Verdene Lennert.-RN

## 2013-12-17 NOTE — ED Notes (Signed)
Attempted to call report

## 2013-12-17 NOTE — Progress Notes (Signed)
Says pain is less on the heparin. Initial enzymes are negative.  Lab reviewed.  I think with a prior nuclear scan last summer that likely will need a repeat cath.  His prior veins were occluded but both mammaries were previously patent.  [facility] will see in my absence over the next few days.  Cardiac catheterization was discussed with the patient fully including risks of myocardial infarction, death, stroke, bleeding, arrhythmia, dye allergy, renal insufficiency or bleeding.  The patient understands and is willing to proceed.  W. Spencer Breland Trouten, Jr. MD [facility]  

## 2013-12-17 NOTE — ED Notes (Signed)
Called bed control about bed placement.

## 2013-12-18 ENCOUNTER — Encounter (HOSPITAL_COMMUNITY)
Admission: EM | Disposition: A | Discharge status: Home / Self Care | Payer: Self-pay | Source: Home / Self Care | Attending: Cardiology

## 2013-12-18 ENCOUNTER — Inpatient Hospital Stay (HOSPITAL_COMMUNITY): Payer: BC Managed Care – PPO

## 2013-12-18 DIAGNOSIS — I2510 Atherosclerotic heart disease of native coronary artery without angina pectoris: Secondary | ICD-10-CM

## 2013-12-18 HISTORY — PX: LEFT HEART CATHETERIZATION WITH CORONARY/GRAFT ANGIOGRAM: SHX5450

## 2013-12-18 LAB — CBC
HCT: 42.5 % (ref 39.0–52.0)
Hemoglobin: 14.7 g/dL (ref 13.0–17.0)
MCH: 29.2 pg (ref 26.0–34.0)
MCHC: 34.6 g/dL (ref 30.0–36.0)
MCV: 84.3 fL (ref 78.0–100.0)
Platelets: 210 10*3/uL (ref 150–400)
RBC: 5.04 MIL/uL (ref 4.22–5.81)
RDW: 13.4 % (ref 11.5–15.5)
WBC: 8.2 10*3/uL (ref 4.0–10.5)

## 2013-12-18 LAB — TROPONIN I: Troponin I: <0.3 ng/mL (ref <0.30)

## 2013-12-18 LAB — HEPARIN LEVEL (UNFRACTIONATED): Heparin Unfractionated: 0.44 IU/mL (ref 0.30–0.70)

## 2013-12-18 SURGERY — LEFT HEART CATHETERIZATION WITH CORONARY/GRAFT ANGIOGRAM
Anesthesia: LOCAL

## 2013-12-18 MED ORDER — LIDOCAINE HCL (PF) 1 % IJ SOLN
INTRAMUSCULAR | Status: AC
  Filled 2013-12-18: qty 30

## 2013-12-18 MED ORDER — OXYCODONE-ACETAMINOPHEN 5-325 MG PO TABS
1.0000 | ORAL_TABLET | ORAL | Status: DC | PRN
  Administered 2013-12-18 (×2): 1 via ORAL
  Filled 2013-12-18 (×2): qty 1

## 2013-12-18 MED ORDER — HYDROMORPHONE HCL PF 1 MG/ML IJ SOLN
1.0000 mg | INTRAMUSCULAR | Status: DC | PRN
  Administered 2013-12-18 (×2): 1 mg via INTRAVENOUS
  Filled 2013-12-18 (×2): qty 1

## 2013-12-18 MED ORDER — SODIUM CHLORIDE 0.9 % IV SOLN: INTRAVENOUS | Status: AC

## 2013-12-18 MED ORDER — HEPARIN (PORCINE) IN NACL 2-0.9 UNIT/ML-% IJ SOLN
INTRAMUSCULAR | Status: AC
  Filled 2013-12-18: qty 1000

## 2013-12-18 MED ORDER — NITROGLYCERIN 0.2 MG/ML ON CALL CATH LAB
INTRAVENOUS | Status: AC
  Filled 2013-12-18: qty 1

## 2013-12-18 MED ORDER — SODIUM CHLORIDE 0.9 % IJ SOLN
3.0000 mL | Freq: Two times a day (BID) | INTRAMUSCULAR | Status: DC
  Administered 2013-12-18: 20:00:00 3 mL via INTRAVENOUS

## 2013-12-18 MED ORDER — BIOTENE DRY MOUTH MT LIQD
15.0000 mL | Freq: Two times a day (BID) | OROMUCOSAL | Status: DC
  Administered 2013-12-18: 15 mL via OROMUCOSAL

## 2013-12-18 MED ORDER — MIDAZOLAM HCL 2 MG/2ML IJ SOLN
INTRAMUSCULAR | Status: AC
  Filled 2013-12-18: qty 2

## 2013-12-18 MED ORDER — FENTANYL CITRATE 0.05 MG/ML IJ SOLN
INTRAMUSCULAR | Status: AC
  Filled 2013-12-18: qty 2

## 2013-12-18 NOTE — Progress Notes (Signed)
Patient now with right groin pain severe 15+, stated worse with ambulation, states pain to rt groin improved with hydrocodone quite a bit and then increased when off bedrest and ambulating, rt groin dressing CDI, 1+ RPPP, states pain starts mid calf all the way down to foot, report given to oncoming RN Suzan Garibaldi RN

## 2013-12-18 NOTE — CV Procedure (Addendum)
     Left Heart Catheterization with Coronary Angiography and Bypass Graft Report  Alyjah Hirschman Glaze  58 y.o.  male Mar 03, 1956  Procedure Date: 12/18/2013 Referring Physician: Jaymes Graff Primary Cardiologist: Jaymes Graff  INDICATIONS: Continuous chest pain with history of prior coronary artery bypass grafting  PROCEDURE: 1. Left heart catheterization; 2. Left ventriculography; 3. Coronary angiography; 4. The saphenous vein graft angiography; 5. Bilateral internal mammary angiography  CONSENT:  The risks, benefits, and details of the procedure were explained in detail to the patient. Risks including death, stroke, heart attack, kidney injury, allergy, limb ischemia, bleeding and radiation injury were discussed.  The patient verbalized understanding and wanted to proceed.  Informed written consent was obtained.  PROCEDURE TECHNIQUE:  After Xylocaine anesthesia a 5 French sheath was placed in the right femoral artery using the modified Seldinger technique.  Coronary angiography was done using a 5 F A2 multipurpose and internal mammary artery catheters.  Left ventriculography was done using the 5 French A2 MP catheter and hand injection.   Saphenous vein graft occlusion was demonstrated. Mammary patency was demonstrated. Significant but unchanged internal mammary disease was noted.  Note that the patient had continuous pain throughout the procedure unaffected by native coronary or bypass graft contrast injections.  Angioseal performed for hemostasis.   CONTRAST:  Total of 100 cc.  COMPLICATIONS:  None   HEMODYNAMICS:  Aortic pressure 110/63 mmHg; LV pressure 116/7 mmHg; LVEDP 15 mm mercury  ANGIOGRAPHIC DATA:   The left main coronary artery is patent.  The left anterior descending artery is totally occluded in the mid vessel after the third diagonal origin. The 3 diagonal branches contain moderate segmental obstructive disease the third obtuse marginal severe  obstruction showing some evidence of progression compared to the prior images.  The left circumflex artery is patent.  The right coronary artery is totally occluded in the mid vessel.  BYPASS GRAFT ANGIOGRAPHY: All 3 saphenous vein grafts are occluded  Right internal mammary graft to the RCA is widely patent  Left internal mammary graft to the LAD is widely patent    LEFT VENTRICULOGRAM:  Left ventricular angiogram was done in the 30 RAO projection and revealed mid anterior wall hypokinesis. Ejection fraction 55%   IMPRESSIONS:  1. Occluded saphenous vein grafts x3 2. Patent right internal mammary graft to the distal RCA 3. Patent left internal mammary graft to the LAD 4. Occluded native right coronary and LAD. Moderately diseased diagonal branches with evidence of progression of disease compared to prior studies. 5. Low normal LVEF at 50-55% with mild mid anterior wall hypokinesis 6. Continuous ongoing chest pain since admission and during catheterization with negative markers and EKGs. Anatomy is unchanged compared to 2012. This all suggests that chest discomfort is noncardiac/ischemic    RECOMMENDATION:  Discharge home later today if all agree.

## 2013-12-18 NOTE — H&P (View-Only) (Signed)
Says pain is less on the heparin. Initial enzymes are negative.  Lab reviewed.  I think with a prior nuclear scan last summer that likely will need a repeat cath.  His prior veins were occluded but both mammaries were previously patent.  [facility] will see in my absence over the next few days.  Cardiac catheterization was discussed with the patient fully including risks of myocardial infarction, death, stroke, bleeding, arrhythmia, dye allergy, renal insufficiency or bleeding.  The patient understands and is willing to proceed.  Darden Palmer MD [facility]

## 2013-12-18 NOTE — Progress Notes (Signed)
Called to see for Rt leg pain. Pt says his whole leg hurts. Percocet not helping. He says he never had pain like this on prior caths. There is no obvious ecchymosis or hematoma on exam. VVS. Will check CT to r/o RTP bleed.  Corine Shelter PA-C 12/18/2013 4:19 PM

## 2013-12-18 NOTE — Interval H&P Note (Signed)
Cath Lab Visit (complete for each Cath Lab visit)  Clinical Evaluation Leading to the Procedure:   ACS: no  Non-ACS:    Anginal Classification: CCS IV  Anti-ischemic medical therapy: Maximal Therapy (2 or more classes of medications)  Non-Invasive Test Results: No non-invasive testing performed  Prior CABG: Previous CABG      History and Physical Interval Note:  12/18/2013 9:39 AM  [name]  has presented today for surgery, with the diagnosis of cp  The various methods of treatment have been discussed with the patient and family. After consideration of risks, benefits and other options for treatment, the patient has consented to  Procedure(s): LEFT HEART CATHETERIZATION WITH CORONARY/GRAFT ANGIOGRAM (N/A) as a surgical intervention .  The patient's history has been reviewed, patient examined, no change in status, stable for surgery.  I have reviewed the patient's chart and labs.  Questions were answered to the patient's satisfaction.     Lyn Records III

## 2013-12-18 NOTE — Progress Notes (Signed)
Utilization Review Completed.Evan Mckinny T Dowell5/13/2015  

## 2013-12-18 NOTE — Progress Notes (Signed)
Corine Shelter PA into see patient and examine for complaints of right lower extremity pain. Orders given for pain medications and CT scan ordered.

## 2013-12-18 NOTE — Progress Notes (Signed)
ANTICOAGULATION CONSULT NOTE - Follow Up Consult  Pharmacy Consult for heparin Indication: chest pain/ACS  Labs:  Recent Labs  12/17/13 1253 12/17/13 1958 12/17/13 2138 12/18/13 0246  HGB 15.2  --   --  14.7  HCT 42.6  --   --  42.5  PLT 197  --   --  210  APTT 28  --   --   --   LABPROT 12.5  --   --   --   INR 0.95  --   --   --   HEPARINUNFRC  --   --  0.27* 0.44  CREATININE 0.84  --   --   --   TROPONINI <0.30 <0.30  --   --     Assessment/Plan:  58yo male now therapeutic on heparin after rate increase. Will continue gtt at current rate and confirm stable with additional level.   [name], PharmD, BCPS  12/18/2013,3:49 AM

## 2013-12-19 ENCOUNTER — Encounter (HOSPITAL_COMMUNITY): Payer: Self-pay | Admitting: Physician Assistant

## 2013-12-19 DIAGNOSIS — I200 Unstable angina: Secondary | ICD-10-CM

## 2013-12-19 DIAGNOSIS — R072 Precordial pain: Secondary | ICD-10-CM | POA: Diagnosis present

## 2013-12-19 DIAGNOSIS — I2510 Atherosclerotic heart disease of native coronary artery without angina pectoris: Secondary | ICD-10-CM

## 2013-12-19 DIAGNOSIS — E669 Obesity, unspecified: Secondary | ICD-10-CM

## 2013-12-19 LAB — CBC
HCT: 41.7 % (ref 39.0–52.0)
Hemoglobin: 14.4 g/dL (ref 13.0–17.0)
MCH: 29.4 pg (ref 26.0–34.0)
MCHC: 34.5 g/dL (ref 30.0–36.0)
MCV: 85.3 fL (ref 78.0–100.0)
Platelets: 209 10*3/uL (ref 150–400)
RBC: 4.89 MIL/uL (ref 4.22–5.81)
RDW: 13.5 % (ref 11.5–15.5)
WBC: 6.2 10*3/uL (ref 4.0–10.5)

## 2013-12-19 MED ORDER — METOPROLOL SUCCINATE ER 25 MG PO TB24: 25.0000 mg | ORAL_TABLET | Freq: Every day | ORAL | Status: DC

## 2013-12-19 NOTE — Discharge Summary (Signed)
CARDIOLOGY DISCHARGE SUMMARY   Patient ID: Alphus Swee Bochenek MRN: [id number] DOB/AGE: July 29, 1956 58 y.o.  Admit date: 12/17/2013 Discharge date: 12/19/2013  PCP: Imelda Pillow, NP Primary Cardiologist: Dr. Donnie Aho  Primary Discharge Diagnosis:    Unstable angina pectoris  Secondary Discharge Diagnosis:    Obesity (BMI 30-39.9)   Precordial pain   Sinus bradycardia   Hyperglycemia - borderline DM  PROCEDURE: 1. Left heart catheterization; 2. Left ventriculography; 3. Coronary angiography; 4. The saphenous vein graft angiography; 5. Bilateral internal mammary angiography; 6. CTA abdomen/pelvis   Hospital Course: Isaid Cotrone Mccampbell is a 57 y.o. male with a long history of CAD. He came to the ER for chest pain. He was admitted for further evaluation and treatment.   His cardiac enzymes were negative for MI. His ECG was unchanged. He had prolonged pain, so cardiac catheterization was performed on 05/14.   The results are below. Medical therapy was recommended for CAD, but there was no clear cause for resting pain. He was held overnight. That night, he had severe leg pain. A CT of his abdomen and pelvis was performed to rule out retroperitoneal hematoma or other abnormality. This was negative.  On 05/14, he was evaluated by Dr. Tresa Endo and all data were reviewed. His HgA1c was slightly elevated, as were his triglycerides. A diabetic diet is recommended and he is to follow up with his primary MD. He had some resting bradycardia with heart rates in the 40's, especially while asleep. He is asymptomatic, so his Metoprolol XL 50 mg was decreased to 25 mg, but not discontinued. There were no other significant abnormalities on his labs. His pain had improved and he was ambulating well. No further inpatient workup is indicated and he is considered stable for discharge, to follow up as an outpatient.  Labs:   Lab Results  Component Value Date   WBC 6.2 12/19/2013   HGB 14.4 12/19/2013   HCT 41.7 12/19/2013     MCV 85.3 12/19/2013   PLT 209 12/19/2013     Recent Labs Lab 12/17/13 1253  NA 141  K 4.4  CL 105  CO2 24  BUN 18  CREATININE 0.84  CALCIUM 8.8  PROT 6.7  BILITOT 0.3  ALKPHOS 60  ALT 21  AST 23  GLUCOSE 95    Recent Labs  12/17/13 1253 12/17/13 1958 12/18/13 0246  TROPONINI <0.30 <0.30 <0.30   Lipid Panel     Component Value Date/Time   CHOL 146 12/17/2013 1253   TRIG 294* 12/17/2013 1253   HDL 48 12/17/2013 1253   CHOLHDL 3.0 12/17/2013 1253   VLDL 59* 12/17/2013 1253   LDLCALC 39 12/17/2013 1253    Recent Labs  12/17/13 1253  INR 0.95   Lab Results  Component Value Date   HGBA1C 6.1* 12/17/2013      Radiology: Ct Abdomen Pelvis Wo Contrast 12/18/2013   CLINICAL DATA:  Right groin pain, the patient had catheterization this morning, question retroperitoneal bleed  EXAM: CT ABDOMEN AND PELVIS WITHOUT CONTRAST  TECHNIQUE: Multidetector CT imaging of the abdomen and pelvis was performed following the standard protocol without IV contrast.  COMPARISON:  None.  FINDINGS: Sagittal images of the spine shows degenerative changes lumbar spine. Mild is pars at L4-L5 level. There is disc space flattening with posterior spurring at L5-S1 level. Mild to moderate spinal canal stenosis due to posterior spurring at L5-S1 level.  Lung bases are unremarkable. There is fatty infiltration of the liver. No calcified  gallstones are noted within gallbladder.  Unenhanced pancreas, spleen and adrenal glands are unremarkable. There is residual excreting contrast material bilateral renal collecting system without hydronephrosis. Exophytic cyst in lower pole of the right kidney posteriorly measures 1.7 cm.  No aortic aneurysm. Mild atherosclerotic calcifications of abdominal aorta and iliac arteries. No a retroperitoneal bleed or hematoma. Scattered diverticula are noted descending colon. Multiple sigmoid colon diverticula. No evidence of acute diverticulitis. Contrast material noted within urinary  bladder. No bladder filling defects are noted. Prostate gland and seminal vesicles are unremarkable.  No pericecal inflammation.  Normal appendix.  No calcified ureteral calculi are identified. There is a nonobstructive calcified calculus in midpole of the left kidney measures 4 mm.  No mesenteric fluid collection. There is a right lower quadrant mesentery lymph node measures 10 by 7 mm. Small amount of subcutaneous stranding noted in right inguinal region most likely post procedure. No inguinal hematoma or pseudoaneurysm. Small lymph node in right inguinal region measures 9 x 6 mm. No destructive bony lesions are noted within pelvis.  IMPRESSION: 1. There is fatty infiltration of the liver. 2. No hydronephrosis or hydroureter. Left nonobstructive nephrolithiasis. 3. No evidence of mesenteric or retroperitoneal hematoma. 4. No inguinal hematoma. Small amount of subcutaneous stranding in right inguinal region most likely postprocedural. 5. No pericecal inflammation.  Normal appendix. 6. Degenerative changes lumbar spine.   Electronically Signed   By: Natasha MeadLiviu  Pop M.D.   On: 12/18/2013 19:21   Dg Chest 2 View 12/17/2013   CLINICAL DATA:  Shortness of breath, cough  EXAM: CHEST  2 VIEW  COMPARISON:  Prior radiograph from 02/23/2013  FINDINGS: Sequelae of prior CABG present. Mild cardiomegaly is stable. Mediastinal silhouette within normal limits.  The lungs are normally inflated. No airspace consolidation, pleural effusion, or pulmonary edema is identified. There is no pneumothorax. Irregular pleural thickening at the in lateral right lung base is similar to prior studies.  No acute osseous abnormality identified.  IMPRESSION: 1. No acute cardiopulmonary abnormality. 2. Sequelae of prior CABG.   Electronically Signed   By: Rise MuBenjamin  McClintock M.D.   On: 12/17/2013 19:24    Cardiac Cath: 12/18/2013 ANGIOGRAPHIC DATA: The left main coronary artery is patent.  The left anterior descending artery is totally occluded  in the mid vessel after the third diagonal origin. The 3 diagonal branches contain moderate segmental obstructive disease the third obtuse marginal severe obstruction showing some evidence of progression compared to the prior images.  The left circumflex artery is patent.  The right coronary artery is totally occluded in the mid vessel.  BYPASS GRAFT ANGIOGRAPHY: All 3 saphenous vein grafts are occluded  Right internal mammary graft to the RCA is widely patent  Left internal mammary graft to the LAD is widely patent  LEFT VENTRICULOGRAM: Left ventricular angiogram was done in the 30 RAO projection and revealed mid anterior wall hypokinesis. Ejection fraction 55%  IMPRESSIONS: 1. Occluded saphenous vein grafts x3  2. Patent right internal mammary graft to the distal RCA  3. Patent left internal mammary graft to the LAD  4. Occluded native right coronary and LAD. Moderately diseased diagonal branches with evidence of progression of disease compared to prior studies.  5. Low normal LVEF at 50-55% with mild mid anterior wall hypokinesis  6. Continuous ongoing chest pain since admission and during catheterization with negative markers and EKGs. Anatomy is unchanged compared to 2012. This all suggests that chest discomfort is noncardiac/ischemic  EKG 12/18/2013 Sinus Bradycardia Vent. rate 47 BPM  PR interval 190 ms QRS duration 116 ms QT/QTc 450/398 ms P-R-T axes 44 59 66:    FOLLOW UP PLANS AND APPOINTMENTS Allergies  Allergen Reactions  . Niacin And Related Other (See Comments)    unknown  . Penicillins Other (See Comments)    "Makes me pass out"      Medication List         albuterol 108 (90 BASE) MCG/ACT inhaler  Commonly known as:  PROVENTIL HFA  Inhale 2 puffs into the lungs every 6 (six) hours as needed for wheezing or shortness of breath.     ANDROGEL 20.25 MG/1.25GM (1.62%) Gel  Generic drug:  Testosterone  Place 1 application onto the skin daily. Apply two squirts to  each shoulder     aspirin EC 81 MG tablet  Take 81 mg by mouth daily.     EPIPEN 0.3 mg/0.3 mL Soaj injection  Generic drug:  EPINEPHrine  Inject 0.3 mg into the muscle once as needed (for allergic reaction).     esomeprazole 20 MG capsule  Commonly known as:  NEXIUM  Take 20 mg by mouth daily at 12 noon.     fluticasone 50 MCG/ACT nasal spray  Commonly known as:  FLONASE  Place 1 spray into both nostrils daily.     ipratropium-albuterol 0.5-2.5 (3) MG/3ML Soln  Commonly known as:  DUONEB  Take 3 mLs by nebulization every 6 (six) hours as needed (for wheezing and shortness of breath).     loratadine 10 MG tablet  Commonly known as:  CLARITIN  Take 10 mg by mouth daily.     magnesium oxide 400 MG tablet  Commonly known as:  MAG-OX  Take 800 mg by mouth daily.     metoprolol succinate 25 MG 24 hr tablet  Commonly known as:  TOPROL-XL  Take 1 tablet (25 mg total) by mouth daily.     nitroGLYCERIN 0.4 MG SL tablet  Commonly known as:  NITROSTAT  Place 0.4 mg under the tongue every 5 (five) minutes as needed.     tadalafil 5 MG tablet  Commonly known as:  CIALIS  Take 5 mg by mouth daily as needed for erectile dysfunction.     tiZANidine 2 MG tablet  Commonly known as:  ZANAFLEX  Take by mouth every 6 (six) hours as needed for muscle spasms.     traZODone 100 MG tablet  Commonly known as:  DESYREL  Take 1 tablet (100 mg total) by mouth at bedtime.     VYTORIN 10-10 MG per tablet  Generic drug:  ezetimibe-simvastatin  Take 1 tablet by mouth daily.        Discharge Orders   Future Orders Complete By Expires   Diet - low sodium heart healthy  As directed    Diet Carb Modified  As directed    Increase activity slowly  As directed      Follow-up Information   Follow up with Darden Palmer, MD On 12/31/2013. (at 10:45 am)    Specialty:  Cardiology   Contact information:   [address] [place] [address] [place] [address] [phone]       BRING  ALL MEDICATIONS WITH YOU TO FOLLOW UP APPOINTMENTS  Time spent with patient to include physician time: 42 min Signed: Darrol Jump, PA-C 12/19/2013, 9:31 AM Co-Sign MD

## 2013-12-19 NOTE — Progress Notes (Signed)
Pt given Zofran for c/o nausea after first dose of dilaudid, nausea resolved.  Pt c/o rt leg pain (entire leg but worst is thigh) increasing again, as well as lower back bilaterally. Pt states history of back surgery. CT report states no retroperitoneal bleed.  IV dilaudid and PO trazadone given with good result, dozing quietly, awakened easily, pain decreased from 8 to 3, "much better" per patient.  Rt groin level 0.  Tele shows SB 44-51, BP 90/37, advised pt not to get up w/o assist due to possibility of dizziness with low BP.

## 2013-12-19 NOTE — Progress Notes (Signed)
Patient Name: [name] Date of Encounter: 12/19/2013  Principal Problem:   Unstable angina pectoris Active Problems:   Obesity (BMI 30-39.9)   Precordial pain    Patient Profile: 58 yo male w/ hx CABG 2005 was admitted by Dr. Donnie Aho on 05/12 with chest pain.   SUBJECTIVE: Chest pain and leg pain has resolved.   OBJECTIVE Filed Vitals:   12/18/13 2013 12/19/13 0001 12/19/13 0617 12/19/13 0744  BP: 122/63 90/37 119/62 119/65  Pulse: 58 50 52 54  Temp: 97.7 F (36.5 C) 98 F (36.7 C) 97.9 F (36.6 C) 97.5 F (36.4 C)  TempSrc: Oral Oral Oral Oral  Resp: 16 18 20 18   Height:      Weight:  206 lb 9.1 oz (93.7 kg)    SpO2: 98% 93% 92% 93%    Intake/Output Summary (Last 24 hours) at 12/19/13 0910 Last data filed at 12/19/13 0747  Gross per 24 hour  Intake 1232.08 ml  Output   1775 ml  Net -542.92 ml   Filed Weights   12/17/13 1306 12/17/13 2043 12/19/13 0001  Weight: 210 lb (95.255 kg) 205 lb 0.4 oz (93 kg) 206 lb 9.1 oz (93.7 kg)    PHYSICAL EXAM General: Well developed, well nourished, male in no acute distress. Head: Normocephalic, atraumatic.  Neck: Supple without bruits, JVD not elevated. Lungs:  Resp regular and unlabored, CTA. Heart: RRR, S1, S2, no S3, S4, or murmur; no rub. Abdomen: Soft, non-tender, non-distended, BS + x 4.  Extremities: No clubbing, cyanosis, no edema. Right groin without significant ecchymosis or hematoma.  Neuro: Alert and oriented X 3. Moves all extremities spontaneously. Psych: Normal affect.  LABS: CBC:  Recent Labs  12/17/13 1253 12/18/13 0246 12/19/13 0407  WBC 6.5 8.2 6.2  NEUTROABS 4.4  --   --   HGB 15.2 14.7 14.4  HCT 42.6 42.5 41.7  MCV 83.9 84.3 85.3  PLT 197 210 209   INR:  Recent Labs  12/17/13 1253  INR 0.95   Basic Metabolic Panel:  Recent Labs  35/57/32 1253  NA 141  K 4.4  CL 105  CO2 24  GLUCOSE 95  BUN 18  CREATININE 0.84  CALCIUM 8.8   Liver Function Tests:  Recent Labs  12/17/13 1253  AST 23  ALT 21  ALKPHOS 60  BILITOT 0.3  PROT 6.7  ALBUMIN 3.4*   Cardiac Enzymes:  Recent Labs  12/17/13 1253 12/17/13 1958 12/18/13 0246  TROPONINI <0.30 <0.30 <0.30   Hemoglobin A1C:  Recent Labs  12/17/13 1253  HGBA1C 6.1*   Fasting Lipid Panel:  Recent Labs  12/17/13 1253  CHOL 146  HDL 48  LDLCALC 39  TRIG 294*  CHOLHDL 3.0   Thyroid Function Tests:  Recent Labs  12/17/13 1253  TSH 0.937   TELE:  SR, sinus bradycardia - into 40s while asleep.  Radiology/Studies: Ct Abdomen Pelvis Wo Contrast 12/18/2013   CLINICAL DATA:  Right groin pain, the patient had catheterization this morning, question retroperitoneal bleed  EXAM: CT ABDOMEN AND PELVIS WITHOUT CONTRAST  TECHNIQUE: Multidetector CT imaging of the abdomen and pelvis was performed following the standard protocol without IV contrast.  COMPARISON:  None.  FINDINGS: Sagittal images of the spine shows degenerative changes lumbar spine. Mild is pars at L4-L5 level. There is disc space flattening with posterior spurring at L5-S1 level. Mild to moderate spinal canal stenosis due to posterior spurring at L5-S1 level.  Lung bases are unremarkable. There  is fatty infiltration of the liver. No calcified gallstones are noted within gallbladder.  Unenhanced pancreas, spleen and adrenal glands are unremarkable. There is residual excreting contrast material bilateral renal collecting system without hydronephrosis. Exophytic cyst in lower pole of the right kidney posteriorly measures 1.7 cm.  No aortic aneurysm. Mild atherosclerotic calcifications of abdominal aorta and iliac arteries. No a retroperitoneal bleed or hematoma. Scattered diverticula are noted descending colon. Multiple sigmoid colon diverticula. No evidence of acute diverticulitis. Contrast material noted within urinary bladder. No bladder filling defects are noted. Prostate gland and seminal vesicles are unremarkable.  No pericecal inflammation.   Normal appendix.  No calcified ureteral calculi are identified. There is a nonobstructive calcified calculus in midpole of the left kidney measures 4 mm.  No mesenteric fluid collection. There is a right lower quadrant mesentery lymph node measures 10 by 7 mm. Small amount of subcutaneous stranding noted in right inguinal region most likely post procedure. No inguinal hematoma or pseudoaneurysm. Small lymph node in right inguinal region measures 9 x 6 mm. No destructive bony lesions are noted within pelvis.  IMPRESSION: 1. There is fatty infiltration of the liver. 2. No hydronephrosis or hydroureter. Left nonobstructive nephrolithiasis. 3. No evidence of mesenteric or retroperitoneal hematoma. 4. No inguinal hematoma. Small amount of subcutaneous stranding in right inguinal region most likely postprocedural. 5. No pericecal inflammation.  Normal appendix. 6. Degenerative changes lumbar spine.   Electronically Signed   By: Natasha MeadLiviu  Pop M.D.   On: 12/18/2013 19:21   Dg Chest 2 View 12/17/2013   CLINICAL DATA:  Shortness of breath, cough  EXAM: CHEST  2 VIEW  COMPARISON:  Prior radiograph from 02/23/2013  FINDINGS: Sequelae of prior CABG present. Mild cardiomegaly is stable. Mediastinal silhouette within normal limits.  The lungs are normally inflated. No airspace consolidation, pleural effusion, or pulmonary edema is identified. There is no pneumothorax. Irregular pleural thickening at the in lateral right lung base is similar to prior studies.  No acute osseous abnormality identified.  IMPRESSION: 1. No acute cardiopulmonary abnormality. 2. Sequelae of prior CABG.   Electronically Signed   By: Rise MuBenjamin  McClintock M.D.   On: 12/17/2013 19:24     Current Medications:  . aspirin EC  81 mg Oral Daily  . ezetimibe  10 mg Oral Daily   And  . simvastatin  10 mg Oral q1800  . fluticasone  1 spray Each Nare Daily  . loratadine  10 mg Oral Daily  . metoprolol succinate  50 mg Oral Daily  . pantoprazole  80 mg Oral  Q1200  . sodium chloride  3 mL Intravenous Q12H  . traZODone  100 mg Oral QHS      ASSESSMENT AND PLAN: Principal Problem:   Unstable angina pectoris Active Problems:   Obesity (BMI 30-39.9)   Precordial pain   Signed, Darrol JumpRhonda G Barrett , PA-C 9:10 AM 12/19/2013   Patient seen and examined. Agree with assessment and plan. Cath findings reviewed. No recurrent discomfort. CT results reviewed. R groin site stable. OK for dc today with f/u with Dr. Donnie Ahoilley.   Lennette Biharihomas A. Shantice Menger, MD, [facility] Hospital[facility] 12/19/2013 9:16 AM

## 2013-12-19 NOTE — Progress Notes (Signed)
Could be related to Angioseal deployment causing nerve irritation. This should gradually get better.

## 2013-12-19 NOTE — Discharge Instructions (Signed)
PLEASE REMEMBER TO BRING ALL OF YOUR MEDICATIONS TO EACH OF YOUR FOLLOW-UP OFFICE VISITS. ° °PLEASE ATTEND ALL SCHEDULED FOLLOW-UP APPOINTMENTS.  ° °Activity: Increase activity slowly as tolerated. You Berendt shower, but no soaking baths (or swimming) for 1 week. No driving for 2 days. No lifting over 5 lbs for 1 week. No sexual activity for 1 week.  ° °You Symmonds Return to Work: in 1 week (if applicable) ° °Wound Care: You Stipes wash cath site gently with soap and water. Keep cath site clean and dry. If you notice pain, swelling, bleeding or pus at your cath site, please call [phone]. ° ° ° °Cardiac Cath Site Care °Refer to this sheet in the next few weeks. These instructions provide you with information on caring for yourself after your procedure. Your caregiver Heemstra also give you more specific instructions. Your treatment has been planned according to current medical practices, but problems sometimes occur. Call your caregiver if you have any problems or questions after your procedure. °HOME CARE INSTRUCTIONS °· You Wilmeth shower 24 hours after the procedure. Remove the bandage (dressing) and gently wash the site with plain soap and water. Gently pat the site dry.  °· Do not apply powder or lotion to the site.  °· Do not sit in a bathtub, swimming pool, or whirlpool for 5 to 7 days.  °· No bending, squatting, or lifting anything over 10 pounds (4.5 kg) as directed by your caregiver.  °· Inspect the site at least twice daily.  °· Do not drive home if you are discharged the same day of the procedure. Have someone else drive you.  °· You [address]ede[address] drive 24 hours after the procedure unless otherwise instructed by your caregiver.  °What to expect: °· Any bruising will usually fade within 1 to 2 weeks.  °· Blood that collects in the tissue (hematoma) Zwart be painful to the touch. It should usually decrease in size and tenderness within 1 to 2 weeks.  °SEEK IMMEDIATE MEDICAL CARE IF: °· You have unusual pain at the site or down the  affected limb.  °· You have redness, warmth, swelling, or pain at the site.  °· You have drainage (other than a small amount of blood on the dressing).  °· You have chills.  °· You have a fever or persistent symptoms for more than 72 hours.  °· You have a fever and your symptoms suddenly get worse.  °· Your leg becomes pale, cool, tingly, or numb.  °· You have heavy bleeding from the site. Hold pressure on the site.  °Document Released: 08/27/2010 Document Revised: 07/14/2011 Document Reviewed: 08/27/2010 °ExitCare® Patient Information ©2012 [employer]. ° °

## 2014-01-13 ENCOUNTER — Telehealth: Payer: Self-pay | Admitting: Internal Medicine

## 2014-01-13 NOTE — Telephone Encounter (Signed)
Spoke with wife-she wanted to see if [facility] would fill patients medications without appt tomorrow but the patient told her that he was coming to the appt and would get his medications refilled then. I also explained to the wife that anytime she calls in for the patient that she will need to speak with whomever is working Triage that day and the message would be sent to [facility]. The wife stated she understood.

## 2014-01-13 NOTE — Telephone Encounter (Signed)
Called spouse. She refused to speak with me. I advised her Florentina Addison is with patients and she reports too have katie call her when she is free. Please advise thanks

## 2014-01-14 ENCOUNTER — Encounter: Payer: Self-pay | Admitting: Internal Medicine

## 2014-01-14 ENCOUNTER — Ambulatory Visit (INDEPENDENT_AMBULATORY_CARE_PROVIDER_SITE_OTHER): Payer: BC Managed Care – PPO | Admitting: Internal Medicine

## 2014-01-14 VITALS — BP 118/58 | HR 57 | Ht 68.0 in | Wt 211.0 lb

## 2014-01-14 DIAGNOSIS — J209 Acute bronchitis, unspecified: Secondary | ICD-10-CM

## 2014-01-14 DIAGNOSIS — G4700 Insomnia, unspecified: Secondary | ICD-10-CM

## 2014-01-14 MED ORDER — IPRATROPIUM-ALBUTEROL 0.5-2.5 (3) MG/3ML IN SOLN: 3.0000 mL | Freq: Four times a day (QID) | RESPIRATORY_TRACT | Status: DC | PRN

## 2014-01-14 MED ORDER — TRAZODONE HCL 100 MG PO TABS: 100.0000 mg | ORAL_TABLET | Freq: Every day | ORAL | Status: DC

## 2014-01-14 MED ORDER — ALBUTEROL SULFATE HFA 108 (90 BASE) MCG/ACT IN AERS: INHALATION_SPRAY | RESPIRATORY_TRACT | Status: DC

## 2014-01-14 NOTE — Patient Instructions (Signed)
Med refills sent  Please call as needed

## 2014-01-14 NOTE — Progress Notes (Signed)
05/29/11- 58 year old male former smoker followed for asthma, allergic rhinitis complicated by CAD, GERD.... wife here Last here- 10/13/2010 He had done well through the summer the last 2 or 3 days notes increasing sinus and chest congestion cough and thick white or trace yellow sputum. He denies headache, fever, sore throat. Coughing is making bilateral rib cage sore.  01/04/12- 58 year old male former smoker followed for asthma, allergic rhinitis complicated by CAD, GERD.... wife here Patient states about the same as last visit.  Pretty good spring with no acute issues until today. He woke this morning with some frontal headache, worse leaning over, little nasal discharge. Denies sore throat or sneeze. Ears okay. No cough or wheeze.  04/16/12- 58 year old male former smoker followed for asthma, allergic rhinitis complicated by CAD, GERD.... wife here ACUTE VISIT: cough since 03-06-12;dry hacky cough-has had 2 rounds of Zithromax, 1 prednisone taper and cough syrup-no better. Hard cough all summer, since at least mid July. Occasionally chokes while eating. Persistent sinus drip. Coughed triggers heartburn despite Nexium and we again discussed cyclical cough and the importance of controlling GERD. Short of breath at rest some upper anterior chest wall pressure which is nonexertional. Nebulizer treatments helps some.  09/18/12- 58 year old male former smoker followed for asthma, allergic rhinitis complicated by CAD, GERD.... wife here FOLLOWS FOR: dry hacky cough; only during the day; insurance will not cover Tussionex-needs different cough syrup Same dry cough since July with no acute event. Nebulizer does help using new duoneb. Frontal headache, always stuffy, scant clear nasal discharge. Denies any reflux or aspiration event. Medications reviewed. CXR 04/17/12-  IMPRESSION:  Status post CABG. No active disease.  Original Report Authenticated By: Natasha MeadLIVIU POP, M.D.  11/01/12- 58 year old male former  smoker followed for asthma, allergic rhinitis complicated by CAD, GERD.... wife here FOLLOWS FOR: cough went away but came back about 2 days ago-dry and hacky cough.Wheezing, SOB as well. Felt well after neb/ depo LOV, until past week. C/o incr head and chest congestion, post-nasal drip, chest tight. Denies a cold or fever. Using his neb 1x daily, Symbicort, Rhinocort.  05/10/13- 58 year old male former smoker followed for asthma, allergic rhinitis complicated by CAD, GERD.... wife here FOLLOWS FOR: denies any flare up Has done well through the summer and now early fall with no exacerbation. We discussed medications and vaccination CXR 02/22/13 IMPRESSION:  Hypoaeration without focal pulmonary opacity.  Original Report Authenticated By: Christiana PellantGretchen Green, M.D.  01/14/14- [age] yoM former smoker followed for asthma, allergic rhinitis complicated by CAD, GERD.... wife here FOLLOWS FOR: Pt states his breathing is unchanged. Pt states with little ambulation and during the night pt becomes SOB. C/o recent dry cough x 2-3 days. Pt states he notices the chest tightness when he coughs.   Does not have a cold, but it for the last 2 or 3 days has had increased cough and chest congestion especially while lying down. Does not notice reflux or ankle edema. Using rescue inhaler once or twice daily. No fever or purulent sputum. CXR 12/17/13 IMPRESSION:  1. No acute cardiopulmonary abnormality.  2. Sequelae of prior CABG.  Electronically Signed  By: Rise MuBenjamin McClintock M.D.  On: 12/17/2013 19:24  ROS-HPI Constitutional:   No-   weight loss, night sweats, fevers, chills, fatigue, lassitude. HEENT:   No-  headaches, difficulty swallowing, tooth/dental problems,  No-sore throat,       No-sneezing, itching, ear ache, nasal congestion, no-post nasal drip,  CV:  No-  Anginal  chest pain, orthopnea, PND, swelling in lower  extremities, anasarca, dizziness, palpitations Resp: +   shortness of breath with exertion or at  rest.              No- productive cough, +-non-productive cough,  No- coughing up of blood.              No-   change in color of mucus.  No- wheezing.   Skin: No-   rash or lesions. GI:  No-heartburn, indigestion, abdominal pain, nausea, vomiting,  GU:  MS:  No-   joint pain or swelling.  Neuro-     nothing unusual Psych:  No- change in mood or affect. No depression or anxiety.  No memory loss.  OBJ General- Alert, Oriented, Affect-appropriate, Distress- none acute, laconic, obese Skin- rash-none, lesions- none, excoriation- none Lymphadenopathy- none Head- atraumatic            Eyes- Gross vision intact, PERRLA, conjunctivae clear secretions            Ears- Hearing, canals-normal            Nose- +thick mucus both nostrils, no-Septal dev, polyps, erosion, perforation             Throat- Mallampati II-III , mucosa clear , drainage- none, tonsils- atrophic Neck- flexible , trachea midline, no stridor , thyroid nl, carotid no bruit Chest - symmetrical excursion , unlabored           Heart/CV- RRR , no murmur , no gallop  , no rub, nl s1 s2                           - JVD- none , edema- none, stasis changes- none, varices- none           Lung- clear,  dullness-none, rub- none, cough+raspy, wheeze-none           Chest wall-  Abd-  Br/ Gen/ Rectal- Not done, not indicated Extrem- cyanosis- none, clubbing, none, atrophy- none, strength- nl Neuro- grossly intact to observation

## 2014-02-13 ENCOUNTER — Other Ambulatory Visit: Payer: Self-pay | Admitting: Orthopedic Surgery

## 2014-02-13 DIAGNOSIS — M25512 Pain in left shoulder: Secondary | ICD-10-CM

## 2014-02-21 ENCOUNTER — Emergency Department: Payer: Self-pay | Admitting: Internal Medicine

## 2014-02-21 ENCOUNTER — Ambulatory Visit
Admission: RE | Admit: 2014-02-21 | Discharge: 2014-02-21 | Disposition: A | Discharge status: Home / Self Care | Payer: BC Managed Care – PPO | Source: Ambulatory Visit | Attending: Orthopedic Surgery | Admitting: Orthopedic Surgery

## 2014-02-21 DIAGNOSIS — M25512 Pain in left shoulder: Secondary | ICD-10-CM

## 2014-02-27 ENCOUNTER — Other Ambulatory Visit: Payer: Self-pay | Admitting: Orthopedic Surgery

## 2014-02-27 DIAGNOSIS — M542 Cervicalgia: Secondary | ICD-10-CM

## 2014-03-10 ENCOUNTER — Ambulatory Visit
Admission: RE | Admit: 2014-03-10 | Discharge: 2014-03-10 | Disposition: A | Discharge status: Home / Self Care | Payer: BC Managed Care – PPO | Source: Ambulatory Visit | Attending: Orthopedic Surgery | Admitting: Orthopedic Surgery

## 2014-03-10 DIAGNOSIS — M542 Cervicalgia: Secondary | ICD-10-CM

## 2014-03-12 ENCOUNTER — Telehealth: Payer: Self-pay | Admitting: Internal Medicine

## 2014-03-12 MED ORDER — HYDROCODONE-HOMATROPINE 5-1.5 MG/5ML PO SYRP: 5.0000 mL | ORAL_SOLUTION | Freq: Four times a day (QID) | ORAL | Status: DC | PRN

## 2014-03-12 NOTE — Telephone Encounter (Signed)
Called and spoke to pt's wife, Gillis Ends. [facility] pt. Last seen on 01/14/14. Pt has been c/o an increase in dry cough with chest congestion for 2 days. Pt is unable to bring any mucous up with a cough. Pt denies any PND, f/c/s, swelling, CP or increase in SOB. Dr. Maple Hudson had previously sent in a zpak with refills and the pt picked up the zpak today but has not yet taken any. Pt is requesting tussinex. [facility] on [address] in [place] is preferred pharmacy. [facility] only takes tussinex volumes in increments of .   KC please advise.   Allergies  Allergen Reactions  . Niacin And Related Other (See Comments)    unknown  . Penicillins Other (See Comments)    "Makes me pass out"      Current Outpatient Prescriptions on File Prior to Visit  Medication Sig Dispense Refill  . albuterol (PROAIR HFA) 108 (90 BASE) MCG/ACT inhaler 2 puffs every 4-6 hours if needed- rescue inhaler  1 Inhaler  prn  . albuterol (PROVENTIL HFA) 108 (90 BASE) MCG/ACT inhaler Inhale 2 puffs into the lungs every 6 (six) hours as needed for wheezing or shortness of breath.  1 Inhaler  prn  . aspirin EC 81 MG tablet Take 81 mg by mouth daily.      Marland Kitchen EPINEPHrine (EPIPEN) 0.3 mg/0.3 mL SOAJ Inject 0.3 mg into the muscle once as needed (for allergic reaction).      Marland Kitchen esomeprazole (NEXIUM) 20 MG capsule Take 20 mg by mouth daily at 12 noon.      . ezetimibe-simvastatin (VYTORIN) 10-10 MG per tablet Take 1 tablet by mouth daily.       . fluticasone (FLONASE) 50 MCG/ACT nasal spray Place 1 spray into both nostrils daily.      Marland Kitchen HYDROcodone-acetaminophen (NORCO) 10-325 MG per tablet Take 1 tablet by mouth as needed.      Marland Kitchen ipratropium-albuterol (DUONEB) 0.5-2.5 (3) MG/3ML SOLN Take 3 mLs by nebulization every 6 (six) hours as needed (for wheezing and shortness of breath).  360 mL  prn  . loratadine (CLARITIN) 10 MG tablet Take 10 mg by mouth daily.      . magnesium oxide (MAG-OX) 400 MG tablet Take 800 mg by mouth  daily.       . meloxicam (MOBIC) 15 MG tablet Take 1 tablet by mouth daily.      . nitroGLYCERIN (NITROSTAT) 0.4 MG SL tablet Place 0.4 mg under the tongue every 5 (five) minutes as needed.       Marland Kitchen oxyCODONE-acetaminophen (PERCOCET) 10-325 MG per tablet Take 1 tablet by mouth every 4 (four) hours as needed for pain.      . tadalafil (CIALIS) 5 MG tablet Take 5 mg by mouth daily as needed for erectile dysfunction.      . Testosterone (ANDROGEL) 20.25 MG/1.25GM (1.62%) GEL Place 1 application onto the skin daily. Apply two squirts to each shoulder      . tiZANidine (ZANAFLEX) 2 MG tablet Take by mouth every 6 (six) hours as needed for muscle spasms.      . traZODone (DESYREL) 100 MG tablet Take 1 tablet (100 mg total) by mouth at bedtime.  90 tablet  3   No current facility-administered medications on file prior to visit.

## 2014-03-12 NOTE — Telephone Encounter (Signed)
I usually do not prescribe tussionex.  I would recommend that he try the hycodan.

## 2014-03-12 NOTE — Telephone Encounter (Signed)
Spouse aware. rx left for pick up. Nothing fruther needed

## 2014-03-12 NOTE — Telephone Encounter (Signed)
He needs to take his zpak Can have hycodan #4 ounces, one teaspoon every 6 hrs if needed, no refills.

## 2014-03-12 NOTE — Telephone Encounter (Signed)
LMTCB

## 2014-03-12 NOTE — Telephone Encounter (Signed)
Pt wants a call today.

## 2014-03-12 NOTE — Telephone Encounter (Signed)
Spoke with patient wife--aware for pt to take ZPAK as prescribed Wife states that Hycodan Syrup does not work for the patient--requests something different Tussionex is the only thing that works for suppressing his cough.  Please advised Dr Shelle Iron. Thanks.  Pt aware that if approved, thsi Rx will need to be picked up.

## 2014-03-16 DIAGNOSIS — G4700 Insomnia, unspecified: Secondary | ICD-10-CM | POA: Insufficient documentation

## 2014-03-16 NOTE — Assessment & Plan Note (Signed)
Plan-trazodone with discussion

## 2014-03-16 NOTE — Assessment & Plan Note (Signed)
Plan-refill rescue inhaler and nebulizer solution

## 2014-03-17 ENCOUNTER — Other Ambulatory Visit: Payer: Self-pay | Admitting: Orthopedic Surgery

## 2014-03-19 ENCOUNTER — Encounter (HOSPITAL_COMMUNITY): Payer: Self-pay | Admitting: Pharmacy Technician

## 2014-03-20 ENCOUNTER — Encounter (HOSPITAL_COMMUNITY)
Admission: RE | Admit: 2014-03-20 | Discharge: 2014-03-20 | Disposition: A | Discharge status: Home / Self Care | Payer: BC Managed Care – PPO | Source: Ambulatory Visit | Attending: Orthopedic Surgery | Admitting: Orthopedic Surgery

## 2014-03-20 ENCOUNTER — Encounter (HOSPITAL_COMMUNITY): Payer: Self-pay

## 2014-03-20 DIAGNOSIS — Z01812 Encounter for preprocedural laboratory examination: Secondary | ICD-10-CM | POA: Diagnosis present

## 2014-03-20 HISTORY — DX: Cramp and spasm: R25.2

## 2014-03-20 HISTORY — DX: Unspecified chronic bronchitis: J42

## 2014-03-20 HISTORY — DX: Acute myocardial infarction, unspecified: I21.9

## 2014-03-20 LAB — ABO/RH: ABO/RH(D): A POS

## 2014-03-20 LAB — URINALYSIS, ROUTINE W REFLEX MICROSCOPIC
Bilirubin Urine: NEGATIVE
Glucose, UA: NEGATIVE mg/dL
Hgb urine dipstick: NEGATIVE
Ketones, ur: NEGATIVE mg/dL
Leukocytes, UA: NEGATIVE
Nitrite: NEGATIVE
Protein, ur: NEGATIVE mg/dL
Specific Gravity, Urine: 1.025 (ref 1.005–1.030)
Urobilinogen, UA: 1 mg/dL (ref 0.0–1.0)
pH: 6 (ref 5.0–8.0)

## 2014-03-20 LAB — COMPREHENSIVE METABOLIC PANEL
ALT: 28 U/L (ref 0–53)
AST: 29 U/L (ref 0–37)
Albumin: 3.6 g/dL (ref 3.5–5.2)
Alkaline Phosphatase: 74 U/L (ref 39–117)
Anion gap: 14 (ref 5–15)
BUN: 18 mg/dL (ref 6–23)
CO2: 23 mEq/L (ref 19–32)
Calcium: 9 mg/dL (ref 8.4–10.5)
Chloride: 102 mEq/L (ref 96–112)
Creatinine, Ser: 0.98 mg/dL (ref 0.50–1.35)
GFR calc Af Amer: >90 mL/min (ref >90)
GFR calc non Af Amer: 88 mL/min — ABNORMAL LOW (ref >90)
Glucose, Bld: 82 mg/dL (ref 70–99)
Potassium: 4.4 mEq/L (ref 3.7–5.3)
Sodium: 139 mEq/L (ref 137–147)
Total Bilirubin: 0.3 mg/dL (ref 0.3–1.2)
Total Protein: 6.8 g/dL (ref 6.0–8.3)

## 2014-03-20 LAB — CBC WITH DIFFERENTIAL/PLATELET
Basophils Absolute: 0.1 10*3/uL (ref 0.0–0.1)
Basophils Relative: 1 % (ref 0–1)
Eosinophils Absolute: 0.3 10*3/uL (ref 0.0–0.7)
Eosinophils Relative: 4 % (ref 0–5)
HCT: 42.6 % (ref 39.0–52.0)
Hemoglobin: 14.9 g/dL (ref 13.0–17.0)
Lymphocytes Relative: 32 % (ref 12–46)
Lymphs Abs: 2.4 10*3/uL (ref 0.7–4.0)
MCH: 29.3 pg (ref 26.0–34.0)
MCHC: 35 g/dL (ref 30.0–36.0)
MCV: 83.9 fL (ref 78.0–100.0)
Monocytes Absolute: 0.5 10*3/uL (ref 0.1–1.0)
Monocytes Relative: 7 % (ref 3–12)
Neutro Abs: 4.1 10*3/uL (ref 1.7–7.7)
Neutrophils Relative %: 56 % (ref 43–77)
Platelets: 205 10*3/uL (ref 150–400)
RBC: 5.08 MIL/uL (ref 4.22–5.81)
RDW: 12.9 % (ref 11.5–15.5)
WBC: 7.3 10*3/uL (ref 4.0–10.5)

## 2014-03-20 LAB — TYPE AND SCREEN
ABO/RH(D): A POS
Antibody Screen: NEGATIVE

## 2014-03-20 LAB — APTT: aPTT: 28 seconds (ref 24–37)

## 2014-03-20 LAB — SURGICAL PCR SCREEN
MRSA, PCR: NEGATIVE
Staphylococcus aureus: POSITIVE — AB

## 2014-03-20 LAB — PROTIME-INR
INR: 0.95 (ref 0.00–1.49)
Prothrombin Time: 12.7 seconds (ref 11.6–15.2)

## 2014-03-20 NOTE — Progress Notes (Signed)
I called a prescription for Mupirocin ointment to [facility], [address] [place]. 

## 2014-03-20 NOTE — Pre-Procedure Instructions (Signed)
Dougals Dolph Goldston  03/20/2014   Your procedure is scheduled on:  Thursday, August 20th  Report to [facility] Admitting at 0530 AM.  Call this number if you have problems the morning of surgery: [phone]   Remember:   Do not eat food or drink liquids after midnight.   Take these medicines the morning of surgery with A SIP OF WATER: nexium, flonase, claritin, toprol, percocet if needed, inhalers if needed  Stop taking aspirin, OTC vitamins/herbal medications, NSAIDS (ibuprofen, advil, motrin) 7 days prior to surgery.   Do not wear jewelry.  Do not wear lotions, powders, or perfumes. You Lonsway wear deodorant.  Do not shave 48 hours prior to surgery. Men Cogliano shave face and neck.  Do not bring valuables to the hospital.  [facility] is not responsible for any belongings or valuables.               Contacts, dentures or bridgework Onofre not be worn into surgery.  Leave suitcase in the car. After surgery it Beed be brought to your room.  For patients admitted to the hospital, discharge time is determined by your  treatment team.               Patients discharged the day of surgery will not be allowed to drive home.  Please read over the following fact sheets that you were given: Pain Booklet, Coughing and Deep Breathing, Blood Transfusion Information, MRSA Information and Surgical Site Infection Prevention Virgie - Preparing for Surgery  Before surgery, you can play an important role.  Because skin is not sterile, your skin needs to be as free of germs as possible.  You can reduce the number of germs on you skin by washing with CHG (chlorahexidine gluconate) soap before surgery.  CHG is an antiseptic cleaner which kills germs and bonds with the skin to continue killing germs even after washing.  Please DO NOT use if you have an allergy to CHG or antibacterial soaps.  If your skin becomes reddened/irritated stop using the CHG and inform your nurse when you arrive at Short Stay.  Do  not shave (including legs and underarms) for at least 48 hours prior to the first CHG shower.  You Offenberger shave your face.  Please follow these instructions carefully:   1.  Shower with CHG Soap the night before surgery and the morning of Surgery.  2.  If you choose to wash your hair, wash your hair first as usual with your normal shampoo.  3.  After you shampoo, rinse your hair and body thoroughly to remove the shampoo.  4.  Use CHG as you would any other liquid soap.  You can apply CHG directly to the skin and wash gently with scrungie or a clean washcloth.  5.  Apply the CHG Soap to your body ONLY FROM THE NECK DOWN.  Do not use on open wounds or open sores.  Avoid contact with your eyes, ears, mouth and genitals (private parts).  Wash genitals (private parts) with your normal soap.  6.  Wash thoroughly, paying special attention to the area where your surgery will be performed.  7.  Thoroughly rinse your body with warm water from the neck down.  8.  DO NOT shower/wash with your normal soap after using and rinsing off the CHG Soap.  9.  Pat yourself dry with a clean towel.            10.  Wear clean pajamas.  11.  Place clean sheets on your bed the night of your first shower and do not sleep with pets.  Day of Surgery  Do not apply any lotions/deoderants the morning of surgery.  Please wear clean clothes to the hospital/surgery center.

## 2014-03-26 MED ORDER — POVIDONE-IODINE 7.5 % EX SOLN
Freq: Once | CUTANEOUS | Status: DC
  Filled 2014-03-26: qty 118

## 2014-03-26 MED ORDER — VANCOMYCIN HCL IN DEXTROSE 1-5 GM/200ML-% IV SOLN
1000.0000 mg | INTRAVENOUS | Status: AC
  Administered 2014-03-27: 1000 mg via INTRAVENOUS
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
## 2014-03-27 ENCOUNTER — Ambulatory Visit (HOSPITAL_COMMUNITY): Payer: BC Managed Care – PPO | Admitting: Certified Registered Nurse Anesthetist

## 2014-03-27 ENCOUNTER — Encounter (HOSPITAL_COMMUNITY)
Admission: RE | Disposition: A | Discharge status: Home / Self Care | Payer: Self-pay | Source: Ambulatory Visit | Attending: Orthopedic Surgery

## 2014-03-27 ENCOUNTER — Encounter (HOSPITAL_COMMUNITY): Payer: BC Managed Care – PPO | Admitting: Certified Registered Nurse Anesthetist

## 2014-03-27 ENCOUNTER — Encounter (HOSPITAL_COMMUNITY): Payer: Self-pay | Admitting: *Deleted

## 2014-03-27 ENCOUNTER — Observation Stay (HOSPITAL_COMMUNITY)
Admission: RE | Admit: 2014-03-27 | Discharge: 2014-03-28 | Disposition: A | Discharge status: Home / Self Care | Payer: BC Managed Care – PPO | Source: Ambulatory Visit | Attending: Orthopedic Surgery | Admitting: Orthopedic Surgery

## 2014-03-27 ENCOUNTER — Observation Stay (HOSPITAL_COMMUNITY): Payer: BC Managed Care – PPO

## 2014-03-27 DIAGNOSIS — Z951 Presence of aortocoronary bypass graft: Secondary | ICD-10-CM | POA: Diagnosis not present

## 2014-03-27 DIAGNOSIS — K219 Gastro-esophageal reflux disease without esophagitis: Secondary | ICD-10-CM | POA: Insufficient documentation

## 2014-03-27 DIAGNOSIS — I2510 Atherosclerotic heart disease of native coronary artery without angina pectoris: Secondary | ICD-10-CM | POA: Insufficient documentation

## 2014-03-27 DIAGNOSIS — J42 Unspecified chronic bronchitis: Secondary | ICD-10-CM | POA: Diagnosis not present

## 2014-03-27 DIAGNOSIS — E7800 Pure hypercholesterolemia, unspecified: Secondary | ICD-10-CM | POA: Insufficient documentation

## 2014-03-27 DIAGNOSIS — Z87891 Personal history of nicotine dependence: Secondary | ICD-10-CM | POA: Insufficient documentation

## 2014-03-27 DIAGNOSIS — Z6830 Body mass index (BMI) 30.0-30.9, adult: Secondary | ICD-10-CM | POA: Insufficient documentation

## 2014-03-27 DIAGNOSIS — I252 Old myocardial infarction: Secondary | ICD-10-CM | POA: Diagnosis not present

## 2014-03-27 DIAGNOSIS — I509 Heart failure, unspecified: Secondary | ICD-10-CM | POA: Insufficient documentation

## 2014-03-27 DIAGNOSIS — M5030 Other cervical disc degeneration, unspecified cervical region: Secondary | ICD-10-CM | POA: Diagnosis not present

## 2014-03-27 DIAGNOSIS — J45909 Unspecified asthma, uncomplicated: Secondary | ICD-10-CM | POA: Diagnosis not present

## 2014-03-27 DIAGNOSIS — M5410 Radiculopathy, site unspecified: Secondary | ICD-10-CM | POA: Diagnosis present

## 2014-03-27 DIAGNOSIS — Z7982 Long term (current) use of aspirin: Secondary | ICD-10-CM | POA: Insufficient documentation

## 2014-03-27 DIAGNOSIS — I10 Essential (primary) hypertension: Secondary | ICD-10-CM | POA: Insufficient documentation

## 2014-03-27 HISTORY — PX: ANTERIOR CERVICAL DECOMP/DISCECTOMY FUSION: SHX1161

## 2014-03-27 SURGERY — ANTERIOR CERVICAL DECOMPRESSION/DISCECTOMY FUSION 1 LEVEL
Anesthesia: General

## 2014-03-27 MED ORDER — GLYCOPYRROLATE 0.2 MG/ML IJ SOLN
INTRAMUSCULAR | Status: AC
  Filled 2014-03-27: qty 1

## 2014-03-27 MED ORDER — ALUM & MAG HYDROXIDE-SIMETH 200-200-20 MG/5ML PO SUSP: 30.0000 mL | Freq: Four times a day (QID) | ORAL | Status: DC | PRN

## 2014-03-27 MED ORDER — THROMBIN 20000 UNITS EX SOLR
CUTANEOUS | Status: DC | PRN
  Administered 2014-03-27: 09:00:00 via TOPICAL

## 2014-03-27 MED ORDER — OXYCODONE HCL 5 MG/5ML PO SOLN: 5.0000 mg | Freq: Once | ORAL | Status: AC | PRN

## 2014-03-27 MED ORDER — LORATADINE 10 MG PO TABS
10.0000 mg | ORAL_TABLET | Freq: Every day | ORAL | Status: DC
  Administered 2014-03-27: 10 mg via ORAL
  Filled 2014-03-27 (×2): qty 1

## 2014-03-27 MED ORDER — OXYCODONE HCL 5 MG PO TABS
5.0000 mg | ORAL_TABLET | ORAL | Status: DC | PRN
  Administered 2014-03-27 – 2014-03-28 (×5): 5 mg via ORAL
  Filled 2014-03-27 (×5): qty 1

## 2014-03-27 MED ORDER — MORPHINE SULFATE 2 MG/ML IJ SOLN
1.0000 mg | INTRAMUSCULAR | Status: DC | PRN
  Administered 2014-03-27: 2 mg via INTRAVENOUS
  Filled 2014-03-27: qty 1

## 2014-03-27 MED ORDER — HYDROMORPHONE HCL PF 1 MG/ML IJ SOLN
INTRAMUSCULAR | Status: AC
  Administered 2014-03-27: 0.5 mg via INTRAVENOUS
  Filled 2014-03-27: qty 1

## 2014-03-27 MED ORDER — BUPIVACAINE-EPINEPHRINE 0.25% -1:200000 IJ SOLN
INTRAMUSCULAR | Status: DC | PRN
  Administered 2014-03-27: 2 mL

## 2014-03-27 MED ORDER — DEXTROSE 5 % IV SOLN
INTRAVENOUS | Status: DC | PRN
  Administered 2014-03-27: 07:00:00 via INTRAVENOUS

## 2014-03-27 MED ORDER — ONDANSETRON HCL 4 MG/2ML IJ SOLN
INTRAMUSCULAR | Status: AC
  Filled 2014-03-27: qty 2

## 2014-03-27 MED ORDER — BISACODYL 5 MG PO TBEC
5.0000 mg | DELAYED_RELEASE_TABLET | Freq: Every day | ORAL | Status: DC | PRN
  Filled 2014-03-27: qty 1

## 2014-03-27 MED ORDER — ACETAMINOPHEN 325 MG PO TABS: 325.0000 mg | ORAL_TABLET | ORAL | Status: DC | PRN

## 2014-03-27 MED ORDER — ACETAMINOPHEN 325 MG PO TABS: 650.0000 mg | ORAL_TABLET | ORAL | Status: DC | PRN

## 2014-03-27 MED ORDER — THROMBIN 20000 UNITS EX SOLR
CUTANEOUS | Status: AC
  Filled 2014-03-27: qty 20000

## 2014-03-27 MED ORDER — OXYCODONE-ACETAMINOPHEN 5-325 MG PO TABS
1.0000 | ORAL_TABLET | ORAL | Status: DC | PRN
  Administered 2014-03-27 – 2014-03-28 (×5): 1 via ORAL
  Filled 2014-03-27 (×5): qty 1

## 2014-03-27 MED ORDER — ONDANSETRON HCL 4 MG/2ML IJ SOLN: 4.0000 mg | INTRAMUSCULAR | Status: DC | PRN

## 2014-03-27 MED ORDER — OXYCODONE HCL 5 MG PO TABS
ORAL_TABLET | ORAL | Status: AC
  Administered 2014-03-27: 5 mg via ORAL
  Filled 2014-03-27: qty 1

## 2014-03-27 MED ORDER — OXYCODONE-ACETAMINOPHEN 10-325 MG PO TABS: 1.0000 | ORAL_TABLET | ORAL | Status: DC | PRN

## 2014-03-27 MED ORDER — TADALAFIL 5 MG PO TABS
5.0000 mg | ORAL_TABLET | ORAL | Status: DC
  Filled 2014-03-27: qty 1

## 2014-03-27 MED ORDER — IPRATROPIUM-ALBUTEROL 0.5-2.5 (3) MG/3ML IN SOLN: 3.0000 mL | Freq: Four times a day (QID) | RESPIRATORY_TRACT | Status: DC | PRN

## 2014-03-27 MED ORDER — ROCURONIUM BROMIDE 50 MG/5ML IV SOLN
INTRAVENOUS | Status: AC
  Filled 2014-03-27: qty 1

## 2014-03-27 MED ORDER — EZETIMIBE-SIMVASTATIN 10-10 MG PO TABS
1.0000 | ORAL_TABLET | Freq: Every day | ORAL | Status: DC
  Filled 2014-03-27: qty 1

## 2014-03-27 MED ORDER — DOCUSATE SODIUM 100 MG PO CAPS
100.0000 mg | ORAL_CAPSULE | Freq: Two times a day (BID) | ORAL | Status: DC
  Administered 2014-03-27: 100 mg via ORAL
  Filled 2014-03-27 (×4): qty 1

## 2014-03-27 MED ORDER — GLYCOPYRROLATE 0.2 MG/ML IJ SOLN
INTRAMUSCULAR | Status: AC
  Filled 2014-03-27: qty 4

## 2014-03-27 MED ORDER — ROCURONIUM BROMIDE 100 MG/10ML IV SOLN
INTRAVENOUS | Status: DC | PRN
  Administered 2014-03-27: 50 mg via INTRAVENOUS

## 2014-03-27 MED ORDER — NEOSTIGMINE METHYLSULFATE 10 MG/10ML IV SOLN
INTRAVENOUS | Status: AC
  Filled 2014-03-27: qty 1

## 2014-03-27 MED ORDER — MIDAZOLAM HCL 5 MG/5ML IJ SOLN
INTRAMUSCULAR | Status: DC | PRN
  Administered 2014-03-27 (×2): 1 mg via INTRAVENOUS

## 2014-03-27 MED ORDER — LIDOCAINE HCL (CARDIAC) 20 MG/ML IV SOLN
INTRAVENOUS | Status: AC
  Filled 2014-03-27: qty 5

## 2014-03-27 MED ORDER — FENTANYL CITRATE 0.05 MG/ML IJ SOLN
INTRAMUSCULAR | Status: DC | PRN
  Administered 2014-03-27: 150 ug via INTRAVENOUS
  Administered 2014-03-27 (×2): 50 ug via INTRAVENOUS

## 2014-03-27 MED ORDER — PHENOL 1.4 % MT LIQD: 1.0000 | OROMUCOSAL | Status: DC | PRN

## 2014-03-27 MED ORDER — FENTANYL CITRATE 0.05 MG/ML IJ SOLN
INTRAMUSCULAR | Status: AC
  Filled 2014-03-27: qty 5

## 2014-03-27 MED ORDER — FLEET ENEMA 7-19 GM/118ML RE ENEM
1.0000 | ENEMA | Freq: Once | RECTAL | Status: AC | PRN
  Filled 2014-03-27: qty 1

## 2014-03-27 MED ORDER — NITROGLYCERIN 0.4 MG SL SUBL: 0.4000 mg | SUBLINGUAL_TABLET | SUBLINGUAL | Status: DC | PRN

## 2014-03-27 MED ORDER — EPINEPHRINE 0.3 MG/0.3ML IJ SOAJ: 0.3000 mg | Freq: Once | INTRAMUSCULAR | Status: DC | PRN

## 2014-03-27 MED ORDER — OXYCODONE HCL 5 MG PO TABS
5.0000 mg | ORAL_TABLET | Freq: Once | ORAL | Status: AC | PRN
  Administered 2014-03-27: 5 mg via ORAL

## 2014-03-27 MED ORDER — SODIUM CHLORIDE 0.9 % IV SOLN: 250.0000 mL | INTRAVENOUS | Status: DC

## 2014-03-27 MED ORDER — HYDROMORPHONE HCL PF 1 MG/ML IJ SOLN
0.2500 mg | INTRAMUSCULAR | Status: DC | PRN
  Administered 2014-03-27 (×4): 0.5 mg via INTRAVENOUS

## 2014-03-27 MED ORDER — GLYCOPYRROLATE 0.2 MG/ML IJ SOLN
INTRAMUSCULAR | Status: DC | PRN
  Administered 2014-03-27: .15 mg via INTRAVENOUS
  Administered 2014-03-27: 0.6 mg via INTRAVENOUS

## 2014-03-27 MED ORDER — DIAZEPAM 5 MG PO TABS
ORAL_TABLET | ORAL | Status: AC
  Administered 2014-03-27: 5 mg via ORAL
  Filled 2014-03-27: qty 1

## 2014-03-27 MED ORDER — EZETIMIBE 10 MG PO TABS
10.0000 mg | ORAL_TABLET | Freq: Every day | ORAL | Status: DC
  Administered 2014-03-27: 10 mg via ORAL
  Filled 2014-03-27 (×2): qty 1

## 2014-03-27 MED ORDER — ARTIFICIAL TEARS OP OINT
TOPICAL_OINTMENT | OPHTHALMIC | Status: AC
  Filled 2014-03-27: qty 3.5

## 2014-03-27 MED ORDER — VANCOMYCIN HCL IN DEXTROSE 1-5 GM/200ML-% IV SOLN
1000.0000 mg | Freq: Once | INTRAVENOUS | Status: AC
  Administered 2014-03-27: 1000 mg via INTRAVENOUS
  Filled 2014-03-27: qty 200

## 2014-03-27 MED ORDER — MAGNESIUM OXIDE 400 MG PO TABS
800.0000 mg | ORAL_TABLET | Freq: Every day | ORAL | Status: DC
  Administered 2014-03-27: 800 mg via ORAL
  Filled 2014-03-27 (×2): qty 2

## 2014-03-27 MED ORDER — PROPOFOL 10 MG/ML IV BOLUS
INTRAVENOUS | Status: AC
  Filled 2014-03-27: qty 20

## 2014-03-27 MED ORDER — SENNOSIDES-DOCUSATE SODIUM 8.6-50 MG PO TABS
1.0000 | ORAL_TABLET | Freq: Every evening | ORAL | Status: DC | PRN
  Filled 2014-03-27: qty 1

## 2014-03-27 MED ORDER — MENTHOL 3 MG MT LOZG: 1.0000 | LOZENGE | OROMUCOSAL | Status: DC | PRN

## 2014-03-27 MED ORDER — PANTOPRAZOLE SODIUM 40 MG PO TBEC
40.0000 mg | DELAYED_RELEASE_TABLET | Freq: Every day | ORAL | Status: DC
  Administered 2014-03-27: 40 mg via ORAL
  Filled 2014-03-27: qty 1

## 2014-03-27 MED ORDER — TRAZODONE HCL 100 MG PO TABS
100.0000 mg | ORAL_TABLET | Freq: Every day | ORAL | Status: DC
  Administered 2014-03-27: 100 mg via ORAL
  Filled 2014-03-27 (×2): qty 1

## 2014-03-27 MED ORDER — METOPROLOL SUCCINATE ER 50 MG PO TB24
50.0000 mg | ORAL_TABLET | Freq: Every day | ORAL | Status: DC
  Administered 2014-03-27: 50 mg via ORAL
  Filled 2014-03-27 (×2): qty 1

## 2014-03-27 MED ORDER — LACTATED RINGERS IV SOLN
INTRAVENOUS | Status: DC | PRN
  Administered 2014-03-27 (×3): via INTRAVENOUS

## 2014-03-27 MED ORDER — DIAZEPAM 5 MG PO TABS
5.0000 mg | ORAL_TABLET | Freq: Four times a day (QID) | ORAL | Status: DC | PRN
  Administered 2014-03-27 – 2014-03-28 (×3): 5 mg via ORAL
  Filled 2014-03-27 (×2): qty 1

## 2014-03-27 MED ORDER — SODIUM CHLORIDE 0.9 % IJ SOLN: 3.0000 mL | INTRAMUSCULAR | Status: DC | PRN

## 2014-03-27 MED ORDER — FLUTICASONE PROPIONATE 50 MCG/ACT NA SUSP
1.0000 | Freq: Every day | NASAL | Status: DC
  Filled 2014-03-27: qty 16

## 2014-03-27 MED ORDER — 0.9 % SODIUM CHLORIDE (POUR BTL) OPTIME
TOPICAL | Status: DC | PRN
  Administered 2014-03-27: 1000 mL

## 2014-03-27 MED ORDER — ACETAMINOPHEN 160 MG/5ML PO SOLN: 325.0000 mg | ORAL | Status: DC | PRN

## 2014-03-27 MED ORDER — ALBUTEROL SULFATE (2.5 MG/3ML) 0.083% IN NEBU: 3.0000 mL | INHALATION_SOLUTION | RESPIRATORY_TRACT | Status: DC | PRN

## 2014-03-27 MED ORDER — SODIUM CHLORIDE 0.9 % IJ SOLN: 3.0000 mL | Freq: Two times a day (BID) | INTRAMUSCULAR | Status: DC

## 2014-03-27 MED ORDER — BUPIVACAINE-EPINEPHRINE (PF) 0.25% -1:200000 IJ SOLN
INTRAMUSCULAR | Status: AC
  Filled 2014-03-27: qty 30

## 2014-03-27 MED ORDER — PROPOFOL 10 MG/ML IV BOLUS
INTRAVENOUS | Status: DC | PRN
  Administered 2014-03-27: 10 mg via INTRAVENOUS
  Administered 2014-03-27: 140 mg via INTRAVENOUS
  Administered 2014-03-27: 10 mg via INTRAVENOUS

## 2014-03-27 MED ORDER — MIDAZOLAM HCL 2 MG/2ML IJ SOLN
INTRAMUSCULAR | Status: AC
  Filled 2014-03-27: qty 2

## 2014-03-27 MED ORDER — ONDANSETRON HCL 4 MG/2ML IJ SOLN
INTRAMUSCULAR | Status: DC | PRN
  Administered 2014-03-27: 4 mg via INTRAVENOUS

## 2014-03-27 MED ORDER — SIMVASTATIN 10 MG PO TABS
10.0000 mg | ORAL_TABLET | Freq: Every day | ORAL | Status: DC
  Administered 2014-03-27: 10 mg via ORAL
  Filled 2014-03-27 (×2): qty 1

## 2014-03-27 MED ORDER — ACETAMINOPHEN 650 MG RE SUPP: 650.0000 mg | RECTAL | Status: DC | PRN

## 2014-03-27 MED ORDER — NEOSTIGMINE METHYLSULFATE 10 MG/10ML IV SOLN
INTRAVENOUS | Status: DC | PRN
  Administered 2014-03-27: 4 mg via INTRAVENOUS

## 2014-03-27 MED ORDER — ALBUTEROL SULFATE (2.5 MG/3ML) 0.083% IN NEBU: 3.0000 mL | INHALATION_SOLUTION | Freq: Four times a day (QID) | RESPIRATORY_TRACT | Status: DC | PRN

## 2014-03-27 SURGICAL SUPPLY — 74 items
APL SKNCLS STERI-STRIP NONHPOA (GAUZE/BANDAGES/DRESSINGS) ×1
BENZOIN TINCTURE PRP APPL 2/3 (GAUZE/BANDAGES/DRESSINGS) ×2 IMPLANT
BIT DRILL NEURO 2X3.1 SFT TUCH (MISCELLANEOUS) ×1 IMPLANT
BIT DRILL SRG 14X2.2XFLT CHK (BIT) IMPLANT
BIT DRL SRG 14X2.2XFLT CHK (BIT) ×2
BLADE SURG 15 STRL LF DISP TIS (BLADE) ×1 IMPLANT
BLADE SURG 15 STRL SS (BLADE) ×2
BLADE SURG ROTATE 9660 (MISCELLANEOUS) ×2 IMPLANT
BUR MATCHSTICK NEURO 3.0 LAGG (BURR) IMPLANT
CARTRIDGE OIL MAESTRO DRILL (MISCELLANEOUS) ×1 IMPLANT
CLSR STERI-STRIP ANTIMIC 1/2X4 (GAUZE/BANDAGES/DRESSINGS) ×1 IMPLANT
CORDS BIPOLAR (ELECTRODE) ×2 IMPLANT
COVER SURGICAL LIGHT HANDLE (MISCELLANEOUS) ×2 IMPLANT
CRADLE DONUT ADULT HEAD (MISCELLANEOUS) ×2 IMPLANT
DEVICE ENDSKLTN CRVCL 5MM-0SM (Orthopedic Implant) IMPLANT
DIFFUSER DRILL AIR PNEUMATIC (MISCELLANEOUS) ×2 IMPLANT
DRAIN JACKSON RD 7FR 3/32 (WOUND CARE) IMPLANT
DRAPE C-ARM 42X72 X-RAY (DRAPES) ×2 IMPLANT
DRAPE POUCH INSTRU U-SHP 10X18 (DRAPES) ×2 IMPLANT
DRAPE SURG 17X23 STRL (DRAPES) ×7 IMPLANT
DRILL BIT SKYLINE 14MM (BIT) ×4
DRILL NEURO 2X3.1 SOFT TOUCH (MISCELLANEOUS) ×2
DURAPREP 26ML APPLICATOR (WOUND CARE) ×2 IMPLANT
ELECT COATED BLADE 2.86 ST (ELECTRODE) ×2 IMPLANT
ELECT REM PT RETURN 9FT ADLT (ELECTROSURGICAL) ×2
ELECTRODE REM PT RTRN 9FT ADLT (ELECTROSURGICAL) ×1 IMPLANT
ENDOSKELETON CERVICAL 5MM-0SM (Orthopedic Implant) ×2 IMPLANT
EVACUATOR SILICONE 100CC (DRAIN) IMPLANT
GAUZE SPONGE 4X4 12PLY STRL (GAUZE/BANDAGES/DRESSINGS) ×2 IMPLANT
GAUZE SPONGE 4X4 16PLY XRAY LF (GAUZE/BANDAGES/DRESSINGS) ×2 IMPLANT
GLOVE BIO SURGEON STRL SZ7 (GLOVE) ×2 IMPLANT
GLOVE BIO SURGEON STRL SZ8 (GLOVE) ×2 IMPLANT
GLOVE BIOGEL PI IND STRL 7.0 (GLOVE) ×3 IMPLANT
GLOVE BIOGEL PI IND STRL 8 (GLOVE) ×1 IMPLANT
GLOVE BIOGEL PI INDICATOR 7.0 (GLOVE) ×3
GLOVE BIOGEL PI INDICATOR 8 (GLOVE) ×1
GLOVE ECLIPSE 6.5 STRL STRAW (GLOVE) ×1 IMPLANT
GOWN STRL REUS W/ TWL LRG LVL3 (GOWN DISPOSABLE) ×1 IMPLANT
GOWN STRL REUS W/ TWL XL LVL3 (GOWN DISPOSABLE) ×1 IMPLANT
GOWN STRL REUS W/TWL LRG LVL3 (GOWN DISPOSABLE) ×4
GOWN STRL REUS W/TWL XL LVL3 (GOWN DISPOSABLE) ×2
IV CATH 14GX2 1/4 (CATHETERS) ×2 IMPLANT
KIT BASIN OR (CUSTOM PROCEDURE TRAY) ×2 IMPLANT
KIT ROOM TURNOVER OR (KITS) ×2 IMPLANT
MANIFOLD NEPTUNE II (INSTRUMENTS) ×2 IMPLANT
NEEDLE 27GAX1X1/2 (NEEDLE) ×2 IMPLANT
NEEDLE SPNL 20GX3.5 QUINCKE YW (NEEDLE) ×2 IMPLANT
NS IRRIG 1000ML POUR BTL (IV SOLUTION) ×2 IMPLANT
OIL CARTRIDGE MAESTRO DRILL (MISCELLANEOUS) ×2
PACK ORTHO CERVICAL (CUSTOM PROCEDURE TRAY) ×2 IMPLANT
PAD ARMBOARD 7.5X6 YLW CONV (MISCELLANEOUS) ×4 IMPLANT
PATTIES SURGICAL .5 X.5 (GAUZE/BANDAGES/DRESSINGS) IMPLANT
PATTIES SURGICAL .5 X1 (DISPOSABLE) IMPLANT
PIN DISTRACTION 14 (PIN) ×1 IMPLANT
PLATE SKYLINE 12MM (Plate) ×1 IMPLANT
PUTTY BONE DBX 2.5 MIS (Bone Implant) ×1 IMPLANT
SCREW VAR SELF TAP SKYLINE 14M (Screw) ×1 IMPLANT
SPONGE INTESTINAL PEANUT (DISPOSABLE) ×2 IMPLANT
SPONGE SURGIFOAM ABS GEL 100 (HEMOSTASIS) ×2 IMPLANT
STRIP CLOSURE SKIN 1/2X4 (GAUZE/BANDAGES/DRESSINGS) ×2 IMPLANT
SURGIFLO TRUKIT (HEMOSTASIS) IMPLANT
SUT MNCRL AB 4-0 PS2 18 (SUTURE) IMPLANT
SUT SILK 4 0 (SUTURE)
SUT SILK 4-0 18XBRD TIE 12 (SUTURE) IMPLANT
SUT VIC AB 2-0 CT2 18 VCP726D (SUTURE) ×2 IMPLANT
SYR BULB IRRIGATION 50ML (SYRINGE) ×2 IMPLANT
SYR CONTROL 10ML LL (SYRINGE) ×4 IMPLANT
TAPE CLOTH 4X10 WHT NS (GAUZE/BANDAGES/DRESSINGS) ×2 IMPLANT
TAPE CLOTH SURG 4X10 WHT LF (GAUZE/BANDAGES/DRESSINGS) ×1 IMPLANT
TAPE UMBILICAL COTTON 1/8X30 (MISCELLANEOUS) ×2 IMPLANT
TOWEL OR 17X24 6PK STRL BLUE (TOWEL DISPOSABLE) ×2 IMPLANT
TOWEL OR 17X26 10 PK STRL BLUE (TOWEL DISPOSABLE) ×2 IMPLANT
WATER STERILE IRR 1000ML POUR (IV SOLUTION) IMPLANT
YANKAUER SUCT BULB TIP NO VENT (SUCTIONS) ×2 IMPLANT

## 2014-03-27 NOTE — Anesthesia Procedure Notes (Signed)
Procedure Name: Intubation Date/Time: 03/27/2014 7:54 AM Performed by: Marni Griffon Pre-anesthesia Checklist: Patient identified, Emergency Drugs available, Suction available and Patient being monitored Patient Re-evaluated:Patient Re-evaluated prior to inductionOxygen Delivery Method: Circle system utilized Preoxygenation: Pre-oxygenation with 100% oxygen Intubation Type: IV induction Ventilation: Mask ventilation without difficulty Laryngoscope Size: Mac and 4 (Video Glide) Grade View: Grade II Tube type: Oral Tube size: 7.5 mm Number of attempts: 1 Airway Equipment and Method: Stylet and Video-laryngoscopy Placement Confirmation: ETT inserted through vocal cords under direct vision,  breath sounds checked- equal and bilateral and positive ETCO2 Secured at: 22 (cm at gums) cm Tube secured with: Tape Dental Injury: Teeth and Oropharynx as per pre-operative assessment  Comments: Did not extend neck

## 2014-03-27 NOTE — Anesthesia Preprocedure Evaluation (Addendum)
Anesthesia Evaluation  Patient identified by MRN, date of birth, ID band Patient awake    Reviewed: Allergy & Precautions, H&P , NPO status , Patient's Chart, lab work & pertinent test results  Airway Mallampati: II TM Distance: >3 FB Neck ROM: Full    Dental  (+) Poor Dentition, Edentulous Upper, Edentulous Lower, Dental Advisory Given   Pulmonary asthma , neg sleep apnea, neg COPDneg recent URI, former smoker,  breath sounds clear to auscultation        Cardiovascular hypertension, Pt. on medications and Pt. on home beta blockers - angina+ CAD, + Past MI and + CABG - CHF - dysrhythmias - Valvular Problems/MurmursRhythm:Regular  CAB 2005, MI the month after from ?collapse of one of the grafts, no trouble since   Neuro/Psych Neck pain with arm weakness bilateral unchanged by neck position negative psych ROS   GI/Hepatic GERD-  Medicated and Controlled,  Endo/Other  Morbid obesity  Renal/GU      Musculoskeletal   Abdominal   Peds  Hematology negative hematology ROS (+)   Anesthesia Other Findings Only 2 teeth.  Unable to extend neck very much due to pain.  Reproductive/Obstetrics                          Anesthesia Physical Anesthesia Plan  ASA: III  Anesthesia Plan: General   Post-op Pain Management:    Induction: Intravenous  Airway Management Planned: Oral ETT  Additional Equipment: None  Intra-op Plan:   Post-operative Plan: Extubation in OR  Informed Consent: I have reviewed the patients History and Physical, chart, labs and discussed the procedure including the risks, benefits and alternatives for the proposed anesthesia with the patient or authorized representative who has indicated his/her understanding and acceptance.   Dental advisory given  Plan Discussed with: CRNA and Surgeon  Anesthesia Plan Comments:         Anesthesia Quick Evaluation

## 2014-03-27 NOTE — Progress Notes (Signed)
ANTIBIOTIC CONSULT NOTE - INITIAL  Pharmacy Consult for vancomycin Indication: surgical prophylaxis  Allergies  Allergen Reactions  . Penicillins Other (See Comments)    "Makes me pass out"   . Niacin And Related Other (See Comments)    unknown    Patient Measurements: Height: 5\' 9"  (175.3 cm) Weight: 209 lb (94.802 kg) IBW/kg (Calculated) : 70.7 Adjusted Body Weight:   Vital Signs: Temp: 98.3 F (36.8 C) (08/20 1143) Temp src: Oral (08/20 0549) BP: 141/68 mmHg (08/20 1143) Pulse Rate: 74 (08/20 1143) Intake/Output from previous day:   Intake/Output from this shift: Total I/O In: 2200 [I.V.:2200] Out: 50 [Blood:50]  Labs: No results found for this basename: WBC, HGB, PLT, LABCREA, CREATININE,  in the last 72 hours Estimated Creatinine Clearance: 92.2 ml/min (by C-G formula based on Cr of 0.98). No results found for this basename: VANCOTROUGH, Leodis Binet, VANCORANDOM, GENTTROUGH, GENTPEAK, GENTRANDOM, TOBRATROUGH, TOBRAPEAK, TOBRARND, AMIKACINPEAK, AMIKACINTROU, AMIKACIN,  in the last 72 hours   Microbiology: Recent Results (from the past 720 hour(s))  SURGICAL PCR SCREEN     Status: Abnormal   Collection Time    03/20/14  4:07 PM      Result Value Ref Range Status   MRSA, PCR NEGATIVE  NEGATIVE Final   Staphylococcus aureus POSITIVE (*) NEGATIVE Final   Comment:            The Xpert SA Assay (FDA     approved for NASAL specimens     in patients over 39 years of age),     is one component of     a comprehensive surveillance     program.  Test performance has     been validated by [employer] for patients greater     than or equal to 1 year old.     It is not intended     to diagnose infection nor to     guide or monitor treatment.    Medical History: Past Medical History  Diagnosis Date  . GERD (gastroesophageal reflux disease)   . Asthma   . Allergic rhinitis   . Hypertension   . Hypercholesteremia   . Coronary artery disease   . Lumbar disc  disease   . Myocardial infarction   . Chronic bronchitis   . Leg cramps   . Cramps, muscle, general     Medications:  Scheduled:  . docusate sodium  100 mg Oral BID  . ezetimibe-simvastatin  1 tablet Oral Daily  . fluticasone  1 spray Each Nare Daily  . loratadine  10 mg Oral Daily  . magnesium oxide  800 mg Oral Daily  . metoprolol succinate  50 mg Oral Daily  . pantoprazole  40 mg Oral Daily  . sodium chloride  3 mL Intravenous Q12H  . tadalafil  5 mg Oral QODAY  . traZODone  100 mg Oral QHS   Infusions:  . sodium chloride     Assessment: 58 yo male s/p neurosurgery will be put on vancomycin for surgical prophylaxis.  Patient received one dose of vancomycin 1g iv x1 at 0725 today.  SCr on 08/13 was 0.98. Per RN, patient doesn't have a drain.  Goal of Therapy:  Vancomycin trough level 10-15 mcg/ml  Plan:  - vancomycin 1g iv x1 at 1930 tonight. Pharmacy will sign off.    Lio Wehrly, Tsz-Yin 03/27/2014,12:27 PM

## 2014-03-27 NOTE — Op Note (Signed)
NAMIlsa [name]:  [name], [name]                   ACCOUNT NO.:  [id number]  MEDICAL RECORD NO.:  [id number]  LOCATION:  [facility]                         FACILITY:  [facility]  PHYSICIAN:  Estill BambergMark Chanequa Spees, MD      DATE OF BIRTH:  1956/01/05  DATE OF PROCEDURE: 03/27/2014                              OPERATIVE REPORT   PREOPERATIVE DIAGNOSES: 1. Right C6 radiculopathy. 2. C5-6 degenerative disk disease.  POSTOPERATIVE DIAGNOSES: 1. Right C6 radiculopathy. 2. C5-6 degenerative disk disease.  PROCEDURES: 1. Anterior cervical decompression and fusion C5-6. 2. Placement of anterior instrumentation, C5-6 (12 mm skyline anterior     cervical plate and 14 mm screws). 3. Insertion of interbody device x1 (5 mm parallel small Titan     interbody spacer). 4. Use of morselized allograft. 5. Intraoperative use of fluoroscopy.  SURGEON:  Estill BambergMark Dalena Plantz, MD.  ASSISTANJason Coop:  Kayla McKenzie, PA-C  ANESTHESIA:  General endotracheal anesthesia.  COMPLICATIONS:  None.  DISPOSITION:  Stable.  ESTIMATED BLOOD LOSS:  Minimal.  INDICATIONS FOR SURGERY:  Briefly, [name] is a pleasant 58 year old male who did present to me on March 14, 2014, with ongoing and severe right arm pain, which he has had for the past 6 months.  Of note, the patient did have a 6 month history of ongoing pain in the right arm.  An MRI did clearly reveal severe right neural foraminal stenosis at the C5- 6 level.  Given the patient's failure of conservative care, we did discuss proceeding with the procedure noted above and the patient did elect to proceed.  OPERATIVE DETAILS:  On March 27, 2014, the patient was brought to surgery and general endotracheal anesthesia was administered.  The patient was placed supine on the hospital bed.  The patient was positioned appropriately and the neck was prepped and draped.  A left transverse incision was made over the C5-6 interspace.  The platysma was incised and a Smith-Robinson approach was used.  The  anterior spine was noted and a lateral intraoperative fluoroscopic view did confirm the appropriate operative level.  I then placed a self-retaining retractor. Caspar pins were placed into the C5 and C6 vertebral bodies.  The osteophytes were noted anteriorly, and were removed.  I then went forward with a standard diskectomy.  Of note, the C5-6 intervertebral space was extremely collapsed, however, I did meticulously and carefully remove the intervertebral fragments, and I did maintain the integrity of the endplates above and below.  The posterior longitudinal ligament was identified and entered using a nerve hook.  I then used #1 followed by #2 Kerrison to perform a thorough and complete central and bilateral neural foraminal decompression.  Interbody spacer trials were then placed and I did select the appropriate size interbody spacer, which was packed with DBX mix and tamped into position in the usual fashion using intraoperative fluoroscopy.  A 12 mm anterior cervical plate was placed over the anterior spine. 14-mm variable angle screws were placed, 2 in each vertebral body for a total of 4 screws.  The plate was secured to the screws using the locking mechanism.  The wound was then copiously irrigated.  I was very pleased with the AP  and lateral fluoroscopic images.  I then explored the wound for any undue bleeding, and there is none encountered.  The platysma was then closed using 2-0 Vicryl.  The skin was then closed using 3-0 Monocryl.  Benzoin and Steri-Strips were applied followed by sterile dressing.  All instrument counts were correct at the termination of the procedure.  Of note, Jason Coop was my assistant throughout surgery and did aid in retraction, suctioning, and closure.     Estill Bamberg, MD     MD/MEDQ  D:  03/27/2014  T:  03/27/2014  Job:  [id number]  cc:   [name], M.D.

## 2014-03-27 NOTE — Transfer of Care (Signed)
Immediate Anesthesia Transfer of Care Note  Patient: [name]  Procedure(s) Performed: Procedure(s) with comments: ANTERIOR CERVICAL DECOMPRESSION/DISCECTOMY FUSION 1 LEVEL (N/A) - Anterior cervical decompression fusion, cervical 5-6 with instrumentation and allograft  Patient Location: PACU  Anesthesia Type:General  Level of Consciousness: awake, alert  and patient cooperative  Airway & Oxygen Therapy: Patient Spontanous Breathing and Patient connected to nasal cannula oxygen  Post-op Assessment: Report given to PACU RN, Post -op Vital signs reviewed and stable and Patient moving all extremities  Post vital signs: Reviewed and stable  Complications: No apparent anesthesia complications

## 2014-03-27 NOTE — Anesthesia Postprocedure Evaluation (Signed)
  Anesthesia Post-op Note  Patient: [name]  Procedure(s) Performed: Procedure(s) with comments: ANTERIOR CERVICAL DECOMPRESSION/DISCECTOMY FUSION 1 LEVEL (N/A) - Anterior cervical decompression fusion, cervical 5-6 with instrumentation and allograft  Patient Location: PACU  Anesthesia Type:General  Level of Consciousness: awake and alert   Airway and Oxygen Therapy: Patient Spontanous Breathing and Patient connected to nasal cannula oxygen  Post-op Pain: moderate  Post-op Assessment: Post-op Vital signs reviewed, Patient's Cardiovascular Status Stable, Respiratory Function Stable, Patent Airway, No signs of Nausea or vomiting and Pain level controlled  Post-op Vital Signs: Reviewed and stable  Last Vitals:  Filed Vitals:   03/27/14 1143  BP: 141/68  Pulse: 74  Temp: 36.8 C  Resp: 18    Complications: No apparent anesthesia complications

## 2014-03-28 DIAGNOSIS — M5030 Other cervical disc degeneration, unspecified cervical region: Secondary | ICD-10-CM | POA: Diagnosis not present

## 2014-03-28 MED ORDER — DIPHENHYDRAMINE HCL 25 MG PO CAPS
25.0000 mg | ORAL_CAPSULE | Freq: Four times a day (QID) | ORAL | Status: DC | PRN
  Administered 2014-03-28: 25 mg via ORAL
  Filled 2014-03-28: qty 1

## 2014-03-28 NOTE — Progress Notes (Signed)
    Patient doing well Right arm pain is resolved   Physical Exam: Filed Vitals:   03/28/14 0410  BP: 140/74  Pulse: 78  Temp: 98.9 F (37.2 C)  Resp: 20    Dressing in place NVI Neck soft/supple  POD #1 s/p c5/6 ACDF for severe right radic, doing great  - encourage ambulation - Percocet for pain, Valium for muscle spasms - likely d/c home today

## 2014-03-28 NOTE — Progress Notes (Signed)
Pt doing well. Pt and wife given D/C instructions with Rx's, verbal understanding was provided. Pt's IV was removed prior to D/C. Pt's incision was clean and dry, covered with gauze dressing. Pt D/C'd home via wheelchair @ 1100 per MD order. Pt is stable @ D/C and has no other needs at this time. Rema Fendt, RN

## 2014-03-28 NOTE — Progress Notes (Signed)
UR completed 

## 2014-03-31 ENCOUNTER — Encounter (HOSPITAL_COMMUNITY): Payer: Self-pay | Admitting: Orthopedic Surgery

## 2014-03-31 ENCOUNTER — Emergency Department (HOSPITAL_COMMUNITY)
Admission: EM | Admit: 2014-03-31 | Discharge: 2014-04-01 | Disposition: A | Discharge status: Home / Self Care | Payer: BC Managed Care – PPO | Attending: Emergency Medicine | Admitting: Emergency Medicine

## 2014-03-31 ENCOUNTER — Emergency Department (HOSPITAL_COMMUNITY): Payer: BC Managed Care – PPO

## 2014-03-31 DIAGNOSIS — F410 Panic disorder [episodic paroxysmal anxiety] without agoraphobia: Secondary | ICD-10-CM | POA: Diagnosis present

## 2014-03-31 DIAGNOSIS — M25519 Pain in unspecified shoulder: Secondary | ICD-10-CM | POA: Insufficient documentation

## 2014-03-31 DIAGNOSIS — R000 Tachycardia, unspecified: Secondary | ICD-10-CM | POA: Diagnosis not present

## 2014-03-31 DIAGNOSIS — I252 Old myocardial infarction: Secondary | ICD-10-CM | POA: Diagnosis not present

## 2014-03-31 DIAGNOSIS — R61 Generalized hyperhidrosis: Secondary | ICD-10-CM | POA: Insufficient documentation

## 2014-03-31 DIAGNOSIS — F419 Anxiety disorder, unspecified: Secondary | ICD-10-CM

## 2014-03-31 DIAGNOSIS — Z87891 Personal history of nicotine dependence: Secondary | ICD-10-CM | POA: Insufficient documentation

## 2014-03-31 DIAGNOSIS — IMO0002 Reserved for concepts with insufficient information to code with codable children: Secondary | ICD-10-CM | POA: Insufficient documentation

## 2014-03-31 DIAGNOSIS — K219 Gastro-esophageal reflux disease without esophagitis: Secondary | ICD-10-CM | POA: Insufficient documentation

## 2014-03-31 DIAGNOSIS — I10 Essential (primary) hypertension: Secondary | ICD-10-CM | POA: Diagnosis not present

## 2014-03-31 DIAGNOSIS — J45909 Unspecified asthma, uncomplicated: Secondary | ICD-10-CM | POA: Insufficient documentation

## 2014-03-31 DIAGNOSIS — Z79899 Other long term (current) drug therapy: Secondary | ICD-10-CM | POA: Diagnosis not present

## 2014-03-31 DIAGNOSIS — I2510 Atherosclerotic heart disease of native coronary artery without angina pectoris: Secondary | ICD-10-CM | POA: Insufficient documentation

## 2014-03-31 DIAGNOSIS — R0682 Tachypnea, not elsewhere classified: Secondary | ICD-10-CM | POA: Insufficient documentation

## 2014-03-31 DIAGNOSIS — Z951 Presence of aortocoronary bypass graft: Secondary | ICD-10-CM | POA: Insufficient documentation

## 2014-03-31 DIAGNOSIS — E7800 Pure hypercholesterolemia, unspecified: Secondary | ICD-10-CM | POA: Diagnosis not present

## 2014-03-31 DIAGNOSIS — F411 Generalized anxiety disorder: Secondary | ICD-10-CM | POA: Insufficient documentation

## 2014-03-31 DIAGNOSIS — Z880 Allergy status to penicillin: Secondary | ICD-10-CM | POA: Diagnosis not present

## 2014-03-31 LAB — COMPREHENSIVE METABOLIC PANEL
ALT: 19 U/L (ref 0–53)
AST: 23 U/L (ref 0–37)
Albumin: 3.8 g/dL (ref 3.5–5.2)
Alkaline Phosphatase: 84 U/L (ref 39–117)
Anion gap: 16 — ABNORMAL HIGH (ref 5–15)
BUN: 23 mg/dL (ref 6–23)
CO2: 25 mEq/L (ref 19–32)
Calcium: 9.8 mg/dL (ref 8.4–10.5)
Chloride: 96 mEq/L (ref 96–112)
Creatinine, Ser: 1.01 mg/dL (ref 0.50–1.35)
GFR calc Af Amer: >90 mL/min (ref >90)
GFR calc non Af Amer: 79 mL/min — ABNORMAL LOW (ref >90)
Glucose, Bld: 123 mg/dL — ABNORMAL HIGH (ref 70–99)
Potassium: 4.7 mEq/L (ref 3.7–5.3)
Sodium: 137 mEq/L (ref 137–147)
Total Bilirubin: 0.5 mg/dL (ref 0.3–1.2)
Total Protein: 8 g/dL (ref 6.0–8.3)

## 2014-03-31 LAB — PROTIME-INR
INR: 0.95 (ref 0.00–1.49)
Prothrombin Time: 12.7 seconds (ref 11.6–15.2)

## 2014-03-31 LAB — TROPONIN I: Troponin I: <0.3 ng/mL (ref <0.30)

## 2014-03-31 LAB — CBC
HCT: 49 % (ref 39.0–52.0)
Hemoglobin: 17.9 g/dL — ABNORMAL HIGH (ref 13.0–17.0)
MCH: 30.1 pg (ref 26.0–34.0)
MCHC: 36.5 g/dL — ABNORMAL HIGH (ref 30.0–36.0)
MCV: 82.4 fL (ref 78.0–100.0)
Platelets: 272 10*3/uL (ref 150–400)
RBC: 5.95 MIL/uL — ABNORMAL HIGH (ref 4.22–5.81)
RDW: 12.7 % (ref 11.5–15.5)
WBC: 9.2 10*3/uL (ref 4.0–10.5)

## 2014-03-31 MED ORDER — LORAZEPAM 2 MG/ML IJ SOLN
1.0000 mg | Freq: Once | INTRAMUSCULAR | Status: AC
  Administered 2014-03-31: 1 mg via INTRAVENOUS
  Filled 2014-03-31: qty 1

## 2014-03-31 MED ORDER — SODIUM CHLORIDE 0.9 % IV BOLUS (SEPSIS)
1000.0000 mL | Freq: Once | INTRAVENOUS | Status: AC
  Administered 2014-03-31: 1000 mL via INTRAVENOUS

## 2014-03-31 MED ORDER — IOHEXOL 350 MG/ML SOLN
100.0000 mL | Freq: Once | INTRAVENOUS | Status: AC | PRN
  Administered 2014-03-31: 100 mL via INTRAVENOUS

## 2014-03-31 NOTE — ED Provider Notes (Signed)
A 58 year old male who is 4 days post cervical fusion surgery has been very lethargic at home. Wife states that he has been sleepy and not eating and not ambulating. He expressed a sense of impending doom. His medications were adjusted with the increasing dose of Valium and then discontinuing the oxycodone and acetaminophen. He continues to be lethargic and he was advised to come to the ED for further evaluation. There is also concern about  possible pulmonary embolism. On exam, he is drowsy but arousable. He is noted to be borderline tachycardic and oxygen saturation is borderline low at 92%. Lungs are clear. His incision appears clean without sign of infection. He will be sent for a CT angiogram to rule out pulmonary embolism.  Medical screening examination/treatment/procedure(s) were conducted as a shared visit with non-physician practitioner(s) and myself.  I personally evaluated the patient during the encounter.   EKG Interpretation   Date/Time:  Monday March 31 2014 20:11:38 EDT Ventricular Rate:  113 PR Interval:  156 QRS Duration: 92 QT Interval:  316 QTC Calculation: 433 R Axis:   111 Text Interpretation:  Sinus tachycardia Right axis deviation Abnormal ECG  When compared with ECG of 12/19/2013, Sinus tachycardia has replaced Sinus  bradycardia Abnormal right axis deviation is now Present Confirmed by  [facility]  MD, Kyel Purk ([id number]) on 03/31/2014 10:34:41 PM        Dione Booze, MD 03/31/14 [phone]

## 2014-03-31 NOTE — ED Notes (Signed)
Patient transported to CT 
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
## 2014-03-31 NOTE — ED Notes (Signed)
Patient here after recommendation from PCP. Explains that he had spinal fusion done this past Thursday. Went home and was doing fine until Saturday. At that time patient began to sleep all day and started feeling sense of impending doom. Patient tearful and stating "don't let me die" in triage. Denies fevers at home. Patient A+O x3 in triage, but responds inappropriately to some questions.

## 2014-04-01 MED ORDER — LORAZEPAM 1 MG PO TABS: 0.5000 mg | ORAL_TABLET | Freq: Three times a day (TID) | ORAL | Status: DC | PRN

## 2014-04-01 MED ORDER — TRAMADOL HCL 50 MG PO TABS: 50.0000 mg | ORAL_TABLET | Freq: Four times a day (QID) | ORAL | Status: DC | PRN

## 2014-04-01 MED ORDER — TRAMADOL HCL 50 MG PO TABS
50.0000 mg | ORAL_TABLET | Freq: Once | ORAL | Status: AC
  Administered 2014-04-01: 50 mg via ORAL
  Filled 2014-04-01: qty 1

## 2014-04-01 NOTE — Discharge Instructions (Signed)
Recommend Ativan and Tramadol for symptoms. Discontinue Percocet and Valium. Follow up with your neurosurgeon. Return as needed if symptoms worsen.  Panic Attacks Panic attacks are sudden, short-livedsurges of severe anxiety, fear, or discomfort. They Beller occur for no reason when you are relaxed, when you are anxious, or when you are sleeping. Panic attacks [place] occur for a number of reasons:   Healthy people occasionally have panic attacks in extreme, life-threatening situations, such as war or natural disasters. Normal anxiety is a protective mechanism of the body that helps [place] react to danger (fight or flight response).  Panic attacks are often seen with anxiety disorders, such as panic disorder, social anxiety disorder, generalized anxiety disorder, and phobias. Anxiety disorders cause excessive or uncontrollable anxiety. They Heckard interfere with your relationships or other life activities.  Panic attacks are sometimes seen with other mental illnesses, such as depression and posttraumatic stress disorder.  Certain medical conditions, prescription medicines, and drugs of abuse can cause panic attacks. SYMPTOMS  Panic attacks start suddenly, peak within 20 minutes, and are accompanied by four or more of the following symptoms:  Pounding heart or fast heart rate (palpitations).  Sweating.  Trembling or shaking.  Shortness of breath or feeling smothered.  Feeling choked.  Chest pain or discomfort.  Nausea or strange feeling in your stomach.  Dizziness, light-headedness, or feeling like you will faint.  Chills or hot flushes.  Numbness or tingling in your lips or hands and feet.  Feeling that things are not real or feeling that you are not yourself.  Fear of losing control or going crazy.  Fear of dying. Some of these symptoms can mimic serious medical conditions. For example, you Denson think you are having a heart attack. Although panic attacks can be very scary, they are not  life threatening. DIAGNOSIS  Panic attacks are diagnosed through an assessment by your health care provider. Your health care provider will ask questions about your symptoms, such as where and when they occurred. Your health care provider will also ask about your medical history and use of alcohol and drugs, including prescription medicines. Your health care provider Rakes order blood tests or other studies to rule out a serious medical condition. Your health care provider Riviello refer you to a mental health professional for further evaluation. TREATMENT   Most healthy people who have one or two panic attacks in an extreme, life-threatening situation will not require treatment.  The treatment for panic attacks associated with anxiety disorders or other mental illness typically involves counseling with a mental health professional, medicine, or a combination of both. Your health care provider will help determine what treatment is best for you.  Panic attacks due to physical illness usually go away with treatment of the illness. If prescription medicine is causing panic attacks, talk with your health care provider about stopping the medicine, decreasing the dose, or substituting another medicine.  Panic attacks due to alcohol or drug abuse go away with abstinence. Some adults need professional help in order to stop drinking or using drugs. HOME CARE INSTRUCTIONS   Take all medicines as directed by your health care provider.   Schedule and attend follow-up visits as directed by your health care provider. It is important to keep all your appointments. SEEK MEDICAL CARE IF:  You are not able to take your medicines as prescribed.  Your symptoms do not improve or get worse. SEEK IMMEDIATE MEDICAL CARE IF:   You experience panic attack symptoms that are different than  your usual symptoms.  You have serious thoughts about hurting yourself or others.  You are taking medicine for panic attacks and have  a serious side effect. MAKE SURE YOU:  Understand these instructions.  Will watch your condition.  Will get help right away if you are not doing well or get worse. Document Released: 07/25/2005 Document Revised: 07/30/2013 Document Reviewed: 03/08/2013 [facility] Patient Information 2015 [place], [place]. This information is not intended to replace advice given to you by your health care provider. Make sure you discuss any questions you have with your health care provider.

## 2014-04-16 NOTE — Discharge Summary (Signed)
Patient ID: [name] MRN: [id number] DOB/AGE: March 01, 1956 58 y.o.  Admit date: 03/27/2014 Discharge date: 03/28/2014  Admission Diagnoses:  Active Problems:   Radiculopathy   Discharge Diagnoses:  Same  Past Medical History  Diagnosis Date  . GERD (gastroesophageal reflux disease)   . Asthma   . Allergic rhinitis   . Hypertension   . Hypercholesteremia   . Coronary artery disease   . Lumbar disc disease   . Myocardial infarction   . Chronic bronchitis   . Leg cramps   . Cramps, muscle, general     Surgeries: Procedure(s): ANTERIOR CERVICAL DECOMPRESSION/DISCECTOMY FUSION 1 LEVEL C5-6 on 03/27/2014   Consultants:  None  Discharged Condition: Improved  Hospital Course: [name] is an 58 y.o. male who was admitted 03/27/2014 for operative treatment of radiculopathy. Patient has severe unremitting pain that affects sleep, daily activities, and work/hobbies. After pre-op clearance the patient was taken to the operating room on 03/27/2014 and underwent  Procedure(s): ANTERIOR CERVICAL DECOMPRESSION/DISCECTOMY FUSION 1 LEVEL C5-6.    Patient was given perioperative antibiotics:  Anti-infectives   Start     Dose/Rate Route Frequency Ordered Stop   03/27/14 1930  vancomycin (VANCOCIN) IVPB 1000 mg/200 mL premix     1,000 mg 200 mL/hr over 60 Minutes Intravenous  Once 03/27/14 1229 03/27/14 2018   03/27/14 0600  vancomycin (VANCOCIN) IVPB 1000 mg/200 mL premix     1,000 mg 200 mL/hr over 60 Minutes Intravenous On call to O.R. 03/26/14 1423 03/27/14 0825       Patient was given sequential compression devices, early ambulation to prevent DVT.  Patient benefited maximally from hospital stay and there were no complications.    Recent vital signs: BP 143/67  Pulse 64  Temp(Src) 98.7 F (37.1 C) (Oral)  Resp 18  Ht  (1.753 m)  Wt 94.802 kg (209 lb)  BMI 30.85 kg/m2  SpO2 93%   Discharge Medications:     Medication List    STOP taking these medications         aspirin EC 81 MG tablet     HYDROcodone-acetaminophen 10-325 MG per tablet  Commonly known as:  NORCO     tiZANidine 2 MG tablet  Commonly known as:  ZANAFLEX      TAKE these medications       albuterol 108 (90 BASE) MCG/ACT inhaler  Commonly known as:  PROVENTIL HFA  Inhale 2 puffs into the lungs every 6 (six) hours as needed for wheezing or shortness of breath.     ANDROGEL 20.25 MG/1.25GM (1.62%) Gel  Generic drug:  Testosterone  Place 1-2 application onto the skin daily. Apply 1-2 squirts to each shoulder     EPIPEN 0.3 mg/0.3 mL Soaj injection  Generic drug:  EPINEPHrine  Inject 0.3 mg into the muscle once as needed (for allergic reaction).     esomeprazole 20 MG capsule  Commonly known as:  NEXIUM  Take 20 mg by mouth daily at 12 noon.     fluticasone 50 MCG/ACT nasal spray  Commonly known as:  FLONASE  Place 1 spray into both nostrils daily.     ipratropium-albuterol 0.5-2.5 (3) MG/3ML Soln  Commonly known as:  DUONEB  Take 3 mLs by nebulization every 6 (six) hours as needed (for wheezing and shortness of breath).     loratadine 10 MG tablet  Commonly known as:  CLARITIN  Take 10 mg by mouth daily.     magnesium oxide 400  MG tablet  Commonly known as:  MAG-OX  Take 800 mg by mouth daily.     metoprolol succinate 25 MG 24 hr tablet  Commonly known as:  TOPROL-XL  Take 25 mg by mouth daily.     nitroGLYCERIN 0.4 MG SL tablet  Commonly known as:  NITROSTAT  Place 0.4 mg under the tongue every 5 (five) minutes as needed for chest pain.     tadalafil 5 MG tablet  Commonly known as:  CIALIS  Take 5 mg by mouth every other day.     traZODone 100 MG tablet  Commonly known as:  DESYREL  Take 1 tablet (100 mg total) by mouth at bedtime.     VYTORIN 10-10 MG per tablet  Generic drug:  ezetimibe-simvastatin  Take 1 tablet by mouth daily.        Diagnostic Studies: Dg Cervical Spine 2-3 Views  03/27/2014   CLINICAL DATA:  Anterior fusion at C5-6,  right arm pain  EXAM: CERVICAL SPINE - 2-3 VIEW  COMPARISON:  MR C-spine of 03/10/2014  FINDINGS: C-arm spot films show anterior fusion at C5-6. Normal alignment is maintained. The interbody plug is in good position on the views obtained.  IMPRESSION: Anterior fusion at C5-6.  Normal alignment.   Electronically Signed   By: Dwyane Dee M.D.   On: 03/27/2014 12:04   Ct Angio Chest Pe W/cm &/or Wo Cm  04/01/2014   CLINICAL DATA:  Four days post cervical fusion surgery. Lethargy at home. Borderline oxygen saturation and aeration.  EXAM: CT ANGIOGRAPHY CHEST WITH CONTRAST  TECHNIQUE: Multidetector CT imaging of the chest was performed using the standard protocol during bolus administration of intravenous contrast. Multiplanar CT image reconstructions and MIPs were obtained to evaluate the vascular anatomy.  CONTRAST:  OMNIPAQUE IOHEXOL 350 MG/ML SOLN  COMPARISON:  None.  FINDINGS: Technically adequate study with good opacification of the central and segmental pulmonary arteries. No focal filling defects. No evidence of significant pulmonary embolus.  Normal heart size. Normal caliber thoracic aorta. Postoperative changes in the mediastinum. No significant lymphadenopathy in the chest. Esophagus is decompressed.  No pleural effusion. No pneumothorax. Low lung volumes with diffuse ground-glass changes, possibly indicating edema, atelectasis, or pneumonia. Visualized portions of the upper abdominal organs demonstrate diffuse fatty infiltration of the liver. No destructive bone lesions.  Review of the MIP images confirms the above findings.  IMPRESSION: No evidence of significant pulmonary embolus. Low lung volumes with hazy infiltrates bilaterally suggesting edema, pneumonia, or atelectasis appear   Electronically Signed   By: Burman Nieves M.D.   On: 04/01/2014 00:10   Dg C-arm 1-60 Min  03/27/2014   CLINICAL DATA:  Anterior fusion of C5-6, right arm pain  EXAM: DG C-ARM 61-120 MIN  : COMPARISON:  MR  C-spine of 03/10/2014  FINDINGS: C-arm spot films show anterior fusion at C5-6 with normal alignment maintained. The interbody device appears normally positioned.  IMPRESSION: Anterior fusion at C5-6.  Normal alignment.   Electronically Signed   By: Dwyane Dee M.D.   On: 03/27/2014 12:03    Disposition: 01-Home or Self Care   POD #1 s/p c5/6 ACDF for severe right radic, doing great  -Written scripts for pain signed and in chart -D/C instructions sheet printed and in chart -D/C today  -F/U in office 2 weeks   Signed: Georga Bora 04/16/2014, 1:49 PM

## 2014-04-17 ENCOUNTER — Ambulatory Visit: Payer: Self-pay | Admitting: Nurse Practitioner

## 2014-04-19 ENCOUNTER — Other Ambulatory Visit: Payer: Self-pay | Admitting: Internal Medicine

## 2014-04-26 ENCOUNTER — Encounter (HOSPITAL_COMMUNITY): Payer: Self-pay | Admitting: Emergency Medicine

## 2014-04-26 ENCOUNTER — Emergency Department (HOSPITAL_COMMUNITY)
Admission: EM | Admit: 2014-04-26 | Discharge: 2014-04-26 | Disposition: A | Discharge status: Home / Self Care | Payer: BC Managed Care – PPO | Attending: Emergency Medicine | Admitting: Emergency Medicine

## 2014-04-26 DIAGNOSIS — M51379 Other intervertebral disc degeneration, lumbosacral region without mention of lumbar back pain or lower extremity pain: Secondary | ICD-10-CM | POA: Insufficient documentation

## 2014-04-26 DIAGNOSIS — I252 Old myocardial infarction: Secondary | ICD-10-CM | POA: Diagnosis not present

## 2014-04-26 DIAGNOSIS — IMO0002 Reserved for concepts with insufficient information to code with codable children: Secondary | ICD-10-CM | POA: Insufficient documentation

## 2014-04-26 DIAGNOSIS — Z87891 Personal history of nicotine dependence: Secondary | ICD-10-CM | POA: Diagnosis not present

## 2014-04-26 DIAGNOSIS — K219 Gastro-esophageal reflux disease without esophagitis: Secondary | ICD-10-CM | POA: Insufficient documentation

## 2014-04-26 DIAGNOSIS — T22119A Burn of first degree of unspecified forearm, initial encounter: Secondary | ICD-10-CM | POA: Diagnosis not present

## 2014-04-26 DIAGNOSIS — X12XXXA Contact with other hot fluids, initial encounter: Secondary | ICD-10-CM | POA: Insufficient documentation

## 2014-04-26 DIAGNOSIS — I2510 Atherosclerotic heart disease of native coronary artery without angina pectoris: Secondary | ICD-10-CM | POA: Insufficient documentation

## 2014-04-26 DIAGNOSIS — I10 Essential (primary) hypertension: Secondary | ICD-10-CM | POA: Diagnosis not present

## 2014-04-26 DIAGNOSIS — T2010XA Burn of first degree of head, face, and neck, unspecified site, initial encounter: Secondary | ICD-10-CM

## 2014-04-26 DIAGNOSIS — T2210XA Burn of first degree of shoulder and upper limb, except wrist and hand, unspecified site, initial encounter: Secondary | ICD-10-CM

## 2014-04-26 DIAGNOSIS — T2000XA Burn of unspecified degree of head, face, and neck, unspecified site, initial encounter: Secondary | ICD-10-CM | POA: Insufficient documentation

## 2014-04-26 DIAGNOSIS — Y9289 Other specified places as the place of occurrence of the external cause: Secondary | ICD-10-CM | POA: Diagnosis not present

## 2014-04-26 DIAGNOSIS — Z79899 Other long term (current) drug therapy: Secondary | ICD-10-CM | POA: Diagnosis not present

## 2014-04-26 DIAGNOSIS — J45909 Unspecified asthma, uncomplicated: Secondary | ICD-10-CM | POA: Diagnosis not present

## 2014-04-26 DIAGNOSIS — Z880 Allergy status to penicillin: Secondary | ICD-10-CM | POA: Diagnosis not present

## 2014-04-26 DIAGNOSIS — Z951 Presence of aortocoronary bypass graft: Secondary | ICD-10-CM | POA: Diagnosis not present

## 2014-04-26 DIAGNOSIS — X131XXA Other contact with steam and other hot vapors, initial encounter: Secondary | ICD-10-CM | POA: Diagnosis not present

## 2014-04-26 DIAGNOSIS — M5137 Other intervertebral disc degeneration, lumbosacral region: Secondary | ICD-10-CM | POA: Insufficient documentation

## 2014-04-26 DIAGNOSIS — T23119A Burn of first degree of unspecified thumb (nail), initial encounter: Secondary | ICD-10-CM | POA: Insufficient documentation

## 2014-04-26 DIAGNOSIS — Y9389 Activity, other specified: Secondary | ICD-10-CM | POA: Diagnosis not present

## 2014-04-26 DIAGNOSIS — E7800 Pure hypercholesterolemia, unspecified: Secondary | ICD-10-CM | POA: Diagnosis not present

## 2014-04-26 MED ORDER — HYDROMORPHONE HCL 1 MG/ML IJ SOLN
1.0000 mg | Freq: Once | INTRAMUSCULAR | Status: AC
  Administered 2014-04-26: 1 mg via INTRAVENOUS
  Filled 2014-04-26: qty 1

## 2014-04-26 MED ORDER — OXYCODONE-ACETAMINOPHEN 5-325 MG PO TABS: 1.0000 | ORAL_TABLET | ORAL | Status: DC | PRN
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
## 2014-04-26 NOTE — Discharge Instructions (Signed)
Call for a follow up appointment with a Family or Primary Care Provider.  Return if symptoms worsen, you develop shortness of breath, throat swelling, fever, chills.  Take medication as prescribed.  Keep the area clean and dry.

## 2014-04-26 NOTE — ED Notes (Signed)
Pt's Pulse Ox had been removed, O2 sats of 71% and Pulse rate of 45 not accurate. New pulse ox applied O2 sats 98% RA and and Pulse rate 70. Please disregard that set of VS at 1315
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
## 2014-05-06 ENCOUNTER — Ambulatory Visit: Payer: Self-pay | Admitting: Nurse Practitioner

## 2014-05-28 ENCOUNTER — Telehealth: Payer: Self-pay | Admitting: Internal Medicine

## 2014-05-28 NOTE — Telephone Encounter (Signed)
Spoke with pt's wife, she states that pt has had body cramps all over X2 days, unable to sleep because of this.  States that he used to take Quinine for his cramps because he has had chronic cramps for several years.  Had called pcp about getting an rx for this, was told by pcp office that "it sounds like the cramps are coming from the lungs" and to call pulmonary for this rx.  Pt has already called insurance and states this is covered by [employer].  Pt's wife says to leave a detailed message if she doesn't answer. DLV 01/14/14, next ov 07/18/14. Allergies  Allergen Reactions  . Penicillins Other (See Comments)    "Makes me pass out"   . Niacin And Related Other (See Comments)    unknown     Dr. Shelle Iron please advise.  Thank you.

## 2014-05-28 NOTE — Telephone Encounter (Signed)
Katie called and spoke with sybil and she is aware of KC recs and will speak with the pts PCP. She will call PCP tomorrow to get a referral to see neuro

## 2014-05-28 NOTE — Telephone Encounter (Signed)
To my knowledge, body cramps are not caused by lung disease.  He really needs to discuss this with his primary md.

## 2014-05-28 NOTE — Telephone Encounter (Signed)
LMTCB x 1 

## 2014-06-25 ENCOUNTER — Ambulatory Visit: Payer: Self-pay | Admitting: Gastroenterology
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
## 2014-07-17 ENCOUNTER — Encounter (HOSPITAL_COMMUNITY): Payer: Self-pay | Admitting: Interventional Cardiology

## 2014-07-18 ENCOUNTER — Ambulatory Visit (INDEPENDENT_AMBULATORY_CARE_PROVIDER_SITE_OTHER): Payer: BC Managed Care – PPO | Admitting: Internal Medicine

## 2014-07-18 ENCOUNTER — Encounter: Payer: Self-pay | Admitting: Internal Medicine

## 2014-07-18 VITALS — BP 112/70 | HR 86 | Ht 68.0 in | Wt 206.8 lb

## 2014-07-18 DIAGNOSIS — G4700 Insomnia, unspecified: Secondary | ICD-10-CM

## 2014-07-18 DIAGNOSIS — J4520 Mild intermittent asthma, uncomplicated: Secondary | ICD-10-CM | POA: Insufficient documentation

## 2014-07-18 DIAGNOSIS — J309 Allergic rhinitis, unspecified: Secondary | ICD-10-CM

## 2014-07-18 DIAGNOSIS — J3089 Other allergic rhinitis: Secondary | ICD-10-CM

## 2014-07-18 DIAGNOSIS — J302 Other seasonal allergic rhinitis: Secondary | ICD-10-CM

## 2014-07-18 MED ORDER — FLUTICASONE PROPIONATE 50 MCG/ACT NA SUSP: NASAL | Status: DC

## 2014-07-18 MED ORDER — IPRATROPIUM-ALBUTEROL 0.5-2.5 (3) MG/3ML IN SOLN: 3.0000 mL | Freq: Four times a day (QID) | RESPIRATORY_TRACT | Status: DC | PRN

## 2014-07-18 MED ORDER — HYDROCOD POLST-CHLORPHEN POLST 10-8 MG/5ML PO LQCR: ORAL | Status: DC

## 2014-07-18 MED ORDER — TRAZODONE HCL 100 MG PO TABS: 100.0000 mg | ORAL_TABLET | Freq: Every day | ORAL | Status: DC

## 2014-07-18 MED ORDER — ALBUTEROL SULFATE HFA 108 (90 BASE) MCG/ACT IN AERS: 2.0000 | INHALATION_SPRAY | Freq: Four times a day (QID) | RESPIRATORY_TRACT | Status: DC | PRN

## 2014-07-18 MED ORDER — EPINEPHRINE 0.3 MG/0.3ML IJ SOAJ: 0.3000 mg | Freq: Once | INTRAMUSCULAR | Status: DC | PRN

## 2014-07-18 NOTE — Patient Instructions (Signed)
Scripts refilled  Please call as needed 

## 2014-07-18 NOTE — Assessment & Plan Note (Signed)
Adequate control with mild nonspecific chronic cough. I don't anticipate any problems for him with shoulder surgery. Plan-refill rescue inhaler, EpiPen, nebulizer solution

## 2014-07-18 NOTE — Progress Notes (Signed)
05/29/11- 58 year old male former smoker followed for asthma, allergic rhinitis complicated by CAD, GERD.... wife here Last here- 10/13/2010 He had done well through the summer the last 2 or 3 days notes increasing sinus and chest congestion cough and thick white or trace yellow sputum. He denies headache, fever, sore throat. Coughing is making bilateral rib cage sore.  01/04/12- 58 year old male former smoker followed for asthma, allergic rhinitis complicated by CAD, GERD.... wife here Patient states about the same as last visit.  Pretty good spring with no acute issues until today. He woke this morning with some frontal headache, worse leaning over, little nasal discharge. Denies sore throat or sneeze. Ears okay. No cough or wheeze.  04/16/12- 58 year old male former smoker followed for asthma, allergic rhinitis complicated by CAD, GERD.... wife here ACUTE VISIT: cough since 03-06-12;dry hacky cough-has had 2 rounds of Zithromax, 1 prednisone taper and cough syrup-no better. Hard cough all summer, since at least mid July. Occasionally chokes while eating. Persistent sinus drip. Coughed triggers heartburn despite Nexium and we again discussed cyclical cough and the importance of controlling GERD. Short of breath at rest some upper anterior chest wall pressure which is nonexertional. Nebulizer treatments helps some.  09/18/12- 58 year old male former smoker followed for asthma, allergic rhinitis complicated by CAD, GERD.... wife here FOLLOWS FOR: dry hacky cough; only during the day; insurance will not cover Tussionex-needs different cough syrup Same dry cough since July with no acute event. Nebulizer does help using new duoneb. Frontal headache, always stuffy, scant clear nasal discharge. Denies any reflux or aspiration event. Medications reviewed. CXR 04/17/12-  IMPRESSION:  Status post CABG. No active disease.  Original Report Authenticated By: Natasha Mead, M.D.  11/01/12- 58 year old male former  smoker followed for asthma, allergic rhinitis complicated by CAD, GERD.... wife here FOLLOWS FOR: cough went away but came back about 2 days ago-dry and hacky cough.Wheezing, SOB as well. Felt well after neb/ depo LOV, until past week. C/o incr head and chest congestion, post-nasal drip, chest tight. Denies a cold or fever. Using his neb 1x daily, Symbicort, Rhinocort.  05/10/13- 58 year old male former smoker followed for asthma, allergic rhinitis complicated by CAD, GERD.... wife here FOLLOWS FOR: denies any flare up Has done well through the summer and now early fall with no exacerbation. We discussed medications and vaccination CXR 02/22/13 IMPRESSION:  Hypoaeration without focal pulmonary opacity.  Original Report Authenticated By: Christiana Pellant, M.D.  01/14/14- 58 yoM former smoker followed for asthma, allergic rhinitis complicated by CAD, GERD.... wife here FOLLOWS FOR: Pt states his breathing is unchanged. Pt states with little ambulation and during the night pt becomes SOB. C/o recent dry cough x 2-3 days. Pt states he notices the chest tightness when he coughs.   Does not have a cold, but it for the last 2 or 3 days has had increased cough and chest congestion especially while lying down. Does not notice reflux or ankle edema. Using rescue inhaler once or twice daily. No fever or purulent sputum. CXR 12/17/13 IMPRESSION:  1. No acute cardiopulmonary abnormality.  2. Sequelae of prior CABG.  Electronically Signed  By: Rise Mu M.D.  On: 12/17/2013 19:24  07/18/14- 58 yoM former smoker followed for asthma/ bronchitis, allergic rhinitis complicated by CAD, GERD.... wife here FOLLOWS FOR: Refill HFa inhaler,Epipen, flonase, Duoneb, and Trazodone. Pt would like RX for cough syrup as well. Has cough in am ocasionally at night. Head a CT scan of the chest because of food hanging up he says. Report  reviewed. Dry cough now with no chest pain, wheeze, fever. Sometimes gets substernal  burning without exertion and admits some heartburn. He is pending shoulder surgery. CTa chest 04/01/14 IMPRESSION: No evidence of significant pulmonary embolus. Low lung volumes with hazy infiltrates bilaterally suggesting edema, pneumonia, or atelectasis appear Electronically Signed  By: Burman NievesWilliam Stevens M.D.  On: 04/01/2014 00:10  ROS-HPI Constitutional:   No-   weight loss, night sweats, fevers, chills, fatigue, lassitude. HEENT:   No-  headaches, difficulty swallowing, tooth/dental problems,  No-sore throat,       No-sneezing, itching, ear ache, nasal congestion, no-post nasal drip,  CV:  No-  Anginal  chest pain, orthopnea, PND, swelling in lower extremities, anasarca, dizziness, palpitations Resp: +   shortness of breath with exertion or at rest.              No- productive cough, +-non-productive cough,  No- coughing up of blood.              No-   change in color of mucus.  No- wheezing.   Skin: No-   rash or lesions. GI:  No-heartburn, indigestion, abdominal pain, nausea, vomiting,  GU:  MS: +  joint pain or swelling.  Neuro-     nothing unusual Psych:  No- change in mood or affect. No depression or anxiety.  No memory loss.  OBJ General- Alert, Oriented, Affect-appropriate, Distress- none acute, laconic, obese Skin- rash-none, lesions- none, excoriation- none Lymphadenopathy- none Head- atraumatic            Eyes- Gross vision intact, PERRLA, conjunctivae clear secretions            Ears- Hearing, canals-normal            Nose- + sniffing, no-Septal dev, polyps, erosion, perforation             Throat- Mallampati II-III , mucosa clear , drainage- none, tonsils- atrophic Neck- flexible , trachea midline, no stridor , thyroid nl, carotid no bruit Chest - symmetrical excursion , unlabored           Heart/CV- RRR , no murmur , no gallop  , no rub, nl s1 s2                           - JVD- none , edema- none, stasis changes- none, varices- none           Lung- clear,   dullness-none, rub- none, cough+ mild, wheeze-none           Chest wall-  Abd-  Br/ Gen/ Rectal- Not done, not indicated Extrem- cyanosis- none, clubbing, none, atrophy- none, strength- nl Neuro- grossly intact to observation

## 2014-07-18 NOTE — Assessment & Plan Note (Signed)
Mild perennial nasal symptoms with almost habitual sniffing. I don't think this is a tic.  Plan-refill Flonase with discussion

## 2014-07-18 NOTE — Assessment & Plan Note (Signed)
Reminder good sleep hygiene. Adequate control with trazodone. Plan-refill trazodone

## 2014-07-23 ENCOUNTER — Encounter (HOSPITAL_BASED_OUTPATIENT_CLINIC_OR_DEPARTMENT_OTHER): Payer: Self-pay | Admitting: *Deleted

## 2014-07-23 NOTE — Progress Notes (Signed)
   07/23/14 1247  OBSTRUCTIVE SLEEP APNEA  Have you ever been diagnosed with sleep apnea through a sleep study? No  Do you snore loudly (loud enough to be heard through closed doors)?  1  Do you often feel tired, fatigued, or sleepy during the daytime? 0  Has anyone observed you stop breathing during your sleep? 0  Do you have, or are you being treated for high blood pressure? 1  BMI more than 35 kg/m2? 0  Age over 59 years old? 1  Neck circumference greater than 40 cm/16 inches? 1  Gender: 1  Obstructive Sleep Apnea Score 5  Score 4 or greater  Results sent to PCP

## 2014-07-23 NOTE — Progress Notes (Signed)
Pt recently saw dr Arlyn Leak for clearance, and dr young for his asthma To bring all meds and overnight bag-lives in [place] Will need [address]

## 2014-07-25 ENCOUNTER — Ambulatory Visit (HOSPITAL_BASED_OUTPATIENT_CLINIC_OR_DEPARTMENT_OTHER): Payer: BC Managed Care – PPO | Admitting: Anesthesiology

## 2014-07-25 ENCOUNTER — Encounter (HOSPITAL_BASED_OUTPATIENT_CLINIC_OR_DEPARTMENT_OTHER): Payer: Self-pay

## 2014-07-25 ENCOUNTER — Encounter (HOSPITAL_BASED_OUTPATIENT_CLINIC_OR_DEPARTMENT_OTHER)
Admission: RE | Disposition: A | Discharge status: Home / Self Care | Payer: Self-pay | Source: Ambulatory Visit | Attending: Orthopedic Surgery

## 2014-07-25 ENCOUNTER — Ambulatory Visit (HOSPITAL_BASED_OUTPATIENT_CLINIC_OR_DEPARTMENT_OTHER)
Admission: RE | Admit: 2014-07-25 | Discharge: 2014-07-25 | Disposition: A | Discharge status: Home / Self Care | Payer: BC Managed Care – PPO | Source: Ambulatory Visit | Attending: Orthopedic Surgery | Admitting: Orthopedic Surgery

## 2014-07-25 DIAGNOSIS — J45909 Unspecified asthma, uncomplicated: Secondary | ICD-10-CM | POA: Diagnosis not present

## 2014-07-25 DIAGNOSIS — K219 Gastro-esophageal reflux disease without esophagitis: Secondary | ICD-10-CM | POA: Insufficient documentation

## 2014-07-25 DIAGNOSIS — M4806 Spinal stenosis, lumbar region: Secondary | ICD-10-CM | POA: Diagnosis not present

## 2014-07-25 DIAGNOSIS — M75102 Unspecified rotator cuff tear or rupture of left shoulder, not specified as traumatic: Secondary | ICD-10-CM | POA: Insufficient documentation

## 2014-07-25 DIAGNOSIS — J309 Allergic rhinitis, unspecified: Secondary | ICD-10-CM | POA: Insufficient documentation

## 2014-07-25 DIAGNOSIS — M19012 Primary osteoarthritis, left shoulder: Secondary | ICD-10-CM

## 2014-07-25 DIAGNOSIS — J449 Chronic obstructive pulmonary disease, unspecified: Secondary | ICD-10-CM | POA: Diagnosis not present

## 2014-07-25 DIAGNOSIS — Z825 Family history of asthma and other chronic lower respiratory diseases: Secondary | ICD-10-CM | POA: Insufficient documentation

## 2014-07-25 DIAGNOSIS — Z951 Presence of aortocoronary bypass graft: Secondary | ICD-10-CM | POA: Diagnosis not present

## 2014-07-25 DIAGNOSIS — I252 Old myocardial infarction: Secondary | ICD-10-CM | POA: Diagnosis not present

## 2014-07-25 DIAGNOSIS — Z880 Allergy status to penicillin: Secondary | ICD-10-CM | POA: Insufficient documentation

## 2014-07-25 DIAGNOSIS — M7542 Impingement syndrome of left shoulder: Secondary | ICD-10-CM | POA: Diagnosis present

## 2014-07-25 DIAGNOSIS — Z6830 Body mass index (BMI) 30.0-30.9, adult: Secondary | ICD-10-CM | POA: Insufficient documentation

## 2014-07-25 DIAGNOSIS — M19019 Primary osteoarthritis, unspecified shoulder: Secondary | ICD-10-CM | POA: Diagnosis not present

## 2014-07-25 DIAGNOSIS — M4646 Discitis, unspecified, lumbar region: Secondary | ICD-10-CM | POA: Diagnosis not present

## 2014-07-25 DIAGNOSIS — Z87891 Personal history of nicotine dependence: Secondary | ICD-10-CM | POA: Diagnosis not present

## 2014-07-25 DIAGNOSIS — M1990 Unspecified osteoarthritis, unspecified site: Secondary | ICD-10-CM | POA: Diagnosis present

## 2014-07-25 DIAGNOSIS — I25119 Atherosclerotic heart disease of native coronary artery with unspecified angina pectoris: Secondary | ICD-10-CM | POA: Insufficient documentation

## 2014-07-25 DIAGNOSIS — J42 Unspecified chronic bronchitis: Secondary | ICD-10-CM | POA: Diagnosis not present

## 2014-07-25 DIAGNOSIS — I10 Essential (primary) hypertension: Secondary | ICD-10-CM | POA: Diagnosis not present

## 2014-07-25 DIAGNOSIS — E780 Pure hypercholesterolemia: Secondary | ICD-10-CM | POA: Diagnosis not present

## 2014-07-25 DIAGNOSIS — E669 Obesity, unspecified: Secondary | ICD-10-CM | POA: Insufficient documentation

## 2014-07-25 DIAGNOSIS — Z91018 Allergy to other foods: Secondary | ICD-10-CM | POA: Insufficient documentation

## 2014-07-25 DIAGNOSIS — E785 Hyperlipidemia, unspecified: Secondary | ICD-10-CM | POA: Diagnosis not present

## 2014-07-25 HISTORY — DX: Primary osteoarthritis, left shoulder: M19.012

## 2014-07-25 HISTORY — DX: Unspecified osteoarthritis, unspecified site: M19.90

## 2014-07-25 HISTORY — DX: Impingement syndrome of left shoulder: M75.42

## 2014-07-25 LAB — POCT I-STAT, CHEM 8
BUN: 18 mg/dL (ref 6–23)
Calcium, Ion: 1.14 mmol/L (ref 1.12–1.23)
Chloride: 107 mEq/L (ref 96–112)
Creatinine, Ser: 0.8 mg/dL (ref 0.50–1.35)
Glucose, Bld: 126 mg/dL — ABNORMAL HIGH (ref 70–99)
HCT: 46 % (ref 39.0–52.0)
Hemoglobin: 15.6 g/dL (ref 13.0–17.0)
Potassium: 4.3 mEq/L (ref 3.7–5.3)
Sodium: 140 mEq/L (ref 137–147)
TCO2: 22 mmol/L (ref 0–100)

## 2014-07-25 SURGERY — SHOULDER ARTHROSCOPY WITH SUBACROMIAL DECOMPRESSION AND DISTAL CLAVICLE EXCISION
Anesthesia: General | Site: Shoulder | Laterality: Left

## 2014-07-25 MED ORDER — CEFAZOLIN SODIUM-DEXTROSE 2-3 GM-% IV SOLR
INTRAVENOUS | Status: AC
  Filled 2014-07-25: qty 50

## 2014-07-25 MED ORDER — DEXAMETHASONE SODIUM PHOSPHATE 4 MG/ML IJ SOLN
INTRAMUSCULAR | Status: DC | PRN
  Administered 2014-07-25: 10 mg via INTRAVENOUS

## 2014-07-25 MED ORDER — LACTATED RINGERS IV SOLN
INTRAVENOUS | Status: DC
  Administered 2014-07-25 (×2): via INTRAVENOUS

## 2014-07-25 MED ORDER — FENTANYL CITRATE 0.05 MG/ML IJ SOLN
INTRAMUSCULAR | Status: DC | PRN
  Administered 2014-07-25 (×2): 25 ug via INTRAVENOUS

## 2014-07-25 MED ORDER — HYDROMORPHONE HCL 2 MG PO TABS: 2.0000 mg | ORAL_TABLET | ORAL | Status: DC | PRN

## 2014-07-25 MED ORDER — FENTANYL CITRATE 0.05 MG/ML IJ SOLN
50.0000 ug | INTRAMUSCULAR | Status: DC | PRN
  Administered 2014-07-25: 100 ug via INTRAVENOUS

## 2014-07-25 MED ORDER — SODIUM CHLORIDE 0.9 % IR SOLN
Status: DC | PRN
  Administered 2014-07-25: 9000 mL

## 2014-07-25 MED ORDER — HYDROMORPHONE HCL 1 MG/ML IJ SOLN: 0.2500 mg | INTRAMUSCULAR | Status: DC | PRN

## 2014-07-25 MED ORDER — BUPIVACAINE HCL (PF) 0.5 % IJ SOLN
INTRAMUSCULAR | Status: AC
  Filled 2014-07-25: qty 30

## 2014-07-25 MED ORDER — MIDAZOLAM HCL 2 MG/2ML IJ SOLN
1.0000 mg | INTRAMUSCULAR | Status: DC | PRN
  Administered 2014-07-25: 2 mg via INTRAVENOUS

## 2014-07-25 MED ORDER — SENNA-DOCUSATE SODIUM 8.6-50 MG PO TABS: 2.0000 | ORAL_TABLET | Freq: Every day | ORAL | Status: DC

## 2014-07-25 MED ORDER — FENTANYL CITRATE 0.05 MG/ML IJ SOLN
INTRAMUSCULAR | Status: AC
  Filled 2014-07-25: qty 2

## 2014-07-25 MED ORDER — PROPOFOL 10 MG/ML IV BOLUS
INTRAVENOUS | Status: DC | PRN
  Administered 2014-07-25: 200 mg via INTRAVENOUS

## 2014-07-25 MED ORDER — MIDAZOLAM HCL 2 MG/2ML IJ SOLN
INTRAMUSCULAR | Status: AC
  Filled 2014-07-25: qty 2

## 2014-07-25 MED ORDER — SUCCINYLCHOLINE CHLORIDE 20 MG/ML IJ SOLN
INTRAMUSCULAR | Status: AC
  Filled 2014-07-25: qty 1

## 2014-07-25 MED ORDER — BUPIVACAINE-EPINEPHRINE (PF) 0.5% -1:200000 IJ SOLN
INTRAMUSCULAR | Status: DC | PRN
  Administered 2014-07-25: 30 mL via PERINEURAL

## 2014-07-25 MED ORDER — BUPIVACAINE-EPINEPHRINE (PF) 0.5% -1:200000 IJ SOLN
INTRAMUSCULAR | Status: AC
  Filled 2014-07-25: qty 30

## 2014-07-25 MED ORDER — ONDANSETRON HCL 4 MG/2ML IJ SOLN
INTRAMUSCULAR | Status: DC | PRN
  Administered 2014-07-25: 4 mg via INTRAVENOUS

## 2014-07-25 MED ORDER — OXYCODONE-ACETAMINOPHEN 10-325 MG PO TABS
1.0000 | ORAL_TABLET | Freq: Four times a day (QID) | ORAL | Status: DC | PRN
Start: 2014-07-25 — End: 2015-01-19

## 2014-07-25 MED ORDER — CEFAZOLIN SODIUM-DEXTROSE 2-3 GM-% IV SOLR
2.0000 g | INTRAVENOUS | Status: AC
Start: 2014-07-25 — End: 2014-07-25
  Administered 2014-07-25: 2 g via INTRAVENOUS

## 2014-07-25 MED ORDER — SUCCINYLCHOLINE CHLORIDE 20 MG/ML IJ SOLN
INTRAMUSCULAR | Status: DC | PRN
  Administered 2014-07-25: 100 mg via INTRAVENOUS

## 2014-07-25 MED ORDER — FENTANYL CITRATE 0.05 MG/ML IJ SOLN
INTRAMUSCULAR | Status: AC
  Filled 2014-07-25: qty 6

## 2014-07-25 MED ORDER — ONDANSETRON HCL 4 MG PO TABS: 4.0000 mg | ORAL_TABLET | Freq: Three times a day (TID) | ORAL | Status: DC | PRN

## 2014-07-25 MED ORDER — PROMETHAZINE HCL 25 MG/ML IJ SOLN: 6.2500 mg | INTRAMUSCULAR | Status: DC | PRN

## 2014-07-25 MED ORDER — VANCOMYCIN HCL IN DEXTROSE 1-5 GM/200ML-% IV SOLN: 1000.0000 mg | Freq: Once | INTRAVENOUS | Status: DC

## 2014-07-25 MED ORDER — BACLOFEN 10 MG PO TABS: 10.0000 mg | ORAL_TABLET | Freq: Three times a day (TID) | ORAL | Status: DC

## 2014-07-25 MED ORDER — BUPIVACAINE HCL (PF) 0.25 % IJ SOLN
INTRAMUSCULAR | Status: AC
  Filled 2014-07-25: qty 30

## 2014-07-25 SURGICAL SUPPLY — 73 items
BLADE CUTTER GATOR 3.5 (BLADE) ×2 IMPLANT
BLADE GREAT WHITE 4.2 (BLADE) IMPLANT
BLADE SURG 15 STRL LF DISP TIS (BLADE) IMPLANT
BLADE SURG 15 STRL SS (BLADE)
BLADE VORTEX 6.0 (BLADE) IMPLANT
BUR OVAL 4.0 (BURR) IMPLANT
BUR OVAL 6.0 (BURR) ×1 IMPLANT
CANNULA 5.75X71 LONG (CANNULA) ×2 IMPLANT
CANNULA TWIST IN 8.25X7CM (CANNULA) IMPLANT
CLSR STERI-STRIP ANTIMIC 1/2X4 (GAUZE/BANDAGES/DRESSINGS) ×2 IMPLANT
CUTTER MENISCUS  4.2MM (BLADE)
CUTTER MENISCUS 4.2MM (BLADE) IMPLANT
DECANTER SPIKE VIAL GLASS SM (MISCELLANEOUS) IMPLANT
DRAPE INCISE IOBAN 66X45 STRL (DRAPES) ×2 IMPLANT
DRAPE SHOULDER BEACH CHAIR (DRAPES) ×2 IMPLANT
DRAPE U 20/CS (DRAPES) ×2 IMPLANT
DRAPE U-SHAPE 47X51 STRL (DRAPES) ×2 IMPLANT
DRSG PAD ABDOMINAL 8X10 ST (GAUZE/BANDAGES/DRESSINGS) ×2 IMPLANT
DURAPREP 26ML APPLICATOR (WOUND CARE) ×2 IMPLANT
ELECT REM PT RETURN 9FT ADLT (ELECTROSURGICAL) ×2
ELECTRODE REM PT RTRN 9FT ADLT (ELECTROSURGICAL) ×1 IMPLANT
FIBERSTICK 2 (SUTURE) IMPLANT
GAUZE SPONGE 4X4 12PLY STRL (GAUZE/BANDAGES/DRESSINGS) ×2 IMPLANT
GLOVE BIO SURGEON STRL SZ7 (GLOVE) ×1 IMPLANT
GLOVE BIO SURGEON STRL SZ8 (GLOVE) ×2 IMPLANT
GLOVE BIOGEL PI IND STRL 7.5 (GLOVE) IMPLANT
GLOVE BIOGEL PI IND STRL 8 (GLOVE) ×2 IMPLANT
GLOVE BIOGEL PI INDICATOR 7.5 (GLOVE) ×1
GLOVE BIOGEL PI INDICATOR 8 (GLOVE) ×2
GLOVE EXAM NITRILE MD LF STRL (GLOVE) ×1 IMPLANT
GLOVE ORTHO TXT STRL SZ7.5 (GLOVE) ×2 IMPLANT
GOWN STRL REUS W/ TWL LRG LVL3 (GOWN DISPOSABLE) ×1 IMPLANT
GOWN STRL REUS W/ TWL XL LVL3 (GOWN DISPOSABLE) ×2 IMPLANT
GOWN STRL REUS W/TWL LRG LVL3 (GOWN DISPOSABLE) ×2
GOWN STRL REUS W/TWL XL LVL3 (GOWN DISPOSABLE) ×4
IMMOBILIZER SHOULDER FOAM XLGE (SOFTGOODS) IMPLANT
KIT BIO-TENODESIS 3X8 DISP (MISCELLANEOUS)
KIT INSRT BABSR STRL DISP BTN (MISCELLANEOUS) IMPLANT
KIT SHOULDER TRACTION (DRAPES) ×2 IMPLANT
MANIFOLD NEPTUNE II (INSTRUMENTS) ×2 IMPLANT
NDL SCORPION MULTI FIRE (NEEDLE) IMPLANT
NDL SUT 6 .5 CRC .975X.05 MAYO (NEEDLE) IMPLANT
NEEDLE MAYO TAPER (NEEDLE)
NEEDLE SCORPION MULTI FIRE (NEEDLE) IMPLANT
PACK ARTHROSCOPY DSU (CUSTOM PROCEDURE TRAY) ×2 IMPLANT
PACK BASIN DAY SURGERY FS (CUSTOM PROCEDURE TRAY) ×2 IMPLANT
PENCIL BUTTON HOLSTER BLD 10FT (ELECTRODE) ×1 IMPLANT
SET ARTHROSCOPY TUBING (MISCELLANEOUS) ×2
SET ARTHROSCOPY TUBING LN (MISCELLANEOUS) ×1 IMPLANT
SHEET MEDIUM DRAPE 40X70 STRL (DRAPES) ×2 IMPLANT
SLEEVE SCD COMPRESS KNEE MED (MISCELLANEOUS) ×2 IMPLANT
SLING ARM IMMOBILIZER LRG (SOFTGOODS) IMPLANT
SLING ARM IMMOBILIZER MED (SOFTGOODS) IMPLANT
SLING ARM LRG ADULT FOAM STRAP (SOFTGOODS) ×1 IMPLANT
SLING ARM MED ADULT FOAM STRAP (SOFTGOODS) IMPLANT
SLING ARM XL FOAM STRAP (SOFTGOODS) IMPLANT
SPONGE LAP 4X18 X RAY DECT (DISPOSABLE) ×1 IMPLANT
SUT FIBERWIRE #2 38 T-5 BLUE (SUTURE)
SUT MNCRL AB 4-0 PS2 18 (SUTURE) ×2 IMPLANT
SUT PDS AB 1 CT  36 (SUTURE)
SUT PDS AB 1 CT 36 (SUTURE) IMPLANT
SUT PROLENE 0 CT 1 CR/8 (SUTURE) IMPLANT
SUT TIGER TAPE 7 IN WHITE (SUTURE) IMPLANT
SUT VIC AB 3-0 SH 27 (SUTURE)
SUT VIC AB 3-0 SH 27X BRD (SUTURE) IMPLANT
SUT VICRYL 3-0 CR8 SH (SUTURE) ×1 IMPLANT
SUTURE FIBERWR #2 38 T-5 BLUE (SUTURE) IMPLANT
TAPE FIBER 2MM 7IN #2 BLUE (SUTURE) IMPLANT
TOWEL OR 17X24 6PK STRL BLUE (TOWEL DISPOSABLE) ×2 IMPLANT
TOWEL OR NON WOVEN STRL DISP B (DISPOSABLE) ×2 IMPLANT
TUBE CONNECTING 20X1/4 (TUBING) IMPLANT
WAND STAR VAC 90 (SURGICAL WAND) ×2 IMPLANT
WATER STERILE IRR 1000ML POUR (IV SOLUTION) ×2 IMPLANT
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
## 2014-07-25 NOTE — Anesthesia Procedure Notes (Signed)
Anesthesia Regional Block:  Interscalene brachial plexus block  Pre-Anesthetic Checklist: ,, timeout performed, Correct Patient, Correct Site, Correct Laterality, Correct Procedure, Correct Position, site marked, Risks and benefits discussed,  Surgical consent,  Pre-op evaluation,  At surgeon's request and post-op pain management  Laterality: Left  Prep: chloraprep       Needles:  Injection technique: Single-shot  Needle Type: Echogenic Stimulator Needle     Needle Length: 5cm 5 cm Needle Gauge: 22 and 22 G    Additional Needles:  Procedures: ultrasound guided (picture in chart) and nerve stimulator Interscalene brachial plexus block  Nerve Stimulator or Paresthesia:  Response: bicep contraction, 0.45 mA,   Additional Responses:   Narrative:  Start time: 07/25/2014 8:36 AM End time: 07/25/2014 8:46 AM Injection made incrementally with aspirations every 5 mL.  Performed by: Personally  Anesthesiologist: Heather Roberts  Additional Notes: Functioning IV was confirmed and monitors applied.  A 20mm 22ga echogenic arrow stimulator was used. Sterile prep and drape,hand hygiene and sterile gloves were used.Ultrasound guidance: relevant anatomy identified, needle position confirmed, local anesthetic spread visualized around nerve(s)., vascular puncture avoided.  Image printed for medical record.  Negative aspiration and negative test dose prior to incremental administration of local anesthetic. The patient tolerated the procedure well.

## 2014-07-25 NOTE — Progress Notes (Signed)
Assisted Dr. Singer with left, ultrasound guided, interscalene  block. Side rails up, monitors on throughout procedure. See vital signs in flow sheet. Tolerated Procedure well.  

## 2014-07-25 NOTE — Op Note (Signed)
07/25/2014  10:16 AM  PATIENT:  [name]    PRE-OPERATIVE DIAGNOSIS:  Left shoulder impingement syndrome, acromioclavicular joint arthrosis, question frozen shoulder  POST-OPERATIVE DIAGNOSIS:  Left shoulder impingement syndrome, acromioclavicular joint arthrosis, inferior labral tear, no evidence for frozen shoulder  PROCEDURE:  LEFT SHOULDER ARTHROSCOPY; DEBRIDEMENT,  DISTAL CLAVICULECTOMY, ACROMIOPLASTY   SURGEON:  Eulas Post, MD  PHYSICIAN ASSISTANT: Janace Litten, OPA-C, present and scrubbed throughout the case, critical for completion in a timely fashion, and for retraction, instrumentation, and closure.  ANESTHESIA:   General  PREOPERATIVE INDICATIONS:  [name] is a  58 y.o. male who has had persistent left shoulder pain, also coexisting cervical disease,  who failed conservative measures and elected for surgical management.    The risks benefits and alternatives were discussed with the patient preoperatively including but not limited to the risks of infection, bleeding, nerve injury, cardiopulmonary complications, the need for revision surgery, among others, and the patient was willing to proceed. We also discussed the risks for incomplete relief of symptoms, and persistent pain.  OPERATIVE IMPLANTS: None  OPERATIVE FINDINGS: Shoulder had full motion during examination under anesthesia. The articular surfaces were pristine, the rotator cuff was pristine from below and above. The superior labrum was all intact as well as anteriorly and posteriorly and the subscapularis with biceps pulley was normal. The inferior labrum did have some fraying. The undersurface of the acromion had moderate fraying, about the CA ligament, and there was a moderate amount of bursitis. This the acromioclavicular joint had degenerative changes.  OPERATIVE PROCEDURE: The patient was brought to the operating room and placed in the supine position. General anesthesia was Mr. IV antibiotics were  given. The left upper extremity was examined and had full motion and then he was placed in a semilateral decubitus position and all bony prominences padded. Timeout was performed. Diagnostic arthroscopy was carried out with the above-named findings. The arthroscopic shaver was used to debride the inferior labrum, and then I went to the subacromial space. Acromioplasty was performed after CA ligament release and I performed a complete bursectomy with extensive debridement. I also exposed the distal clavicle and resected approximately 10 mm of bone. Confirmation was made on multiple views. Satisfactory resection achieved, the rotator cuff was probed and was completely intact, instruments were removed and the portals closed with Monocryl followed by Steri-Strips and sterile gauze. He was awakened and returned the PACU in stable and satisfactory condition. There were no complications and he tolerated the procedure well.

## 2014-07-25 NOTE — Anesthesia Preprocedure Evaluation (Addendum)
Anesthesia Evaluation  Patient identified by MRN, date of birth, ID band Patient awake    Reviewed: Allergy & Precautions, NPO status , Patient's Chart, lab work & pertinent test results  History of Anesthesia Complications Negative for: history of anesthetic complications  Airway Mallampati: I  TM Distance: >3 FB Neck ROM: Full    Dental  (+) Edentulous Upper, Edentulous Lower   Pulmonary asthma , former smoker,    Pulmonary exam normal       Cardiovascular hypertension, Pt. on home beta blockers and Pt. on medications + CAD and + Past MI  Echo 7/14: Study Conclusions  - Left ventricle: The cavity size was normal. Systolic function was probably normal. The estimated ejection fraction was in the range of 50% to 55%. - Left atrium: The atrium was mildly dilated. - Right ventricle: The cavity size was mildly dilated. Wall thickness was normal.    Neuro/Psych negative neurological ROS  negative psych ROS   GI/Hepatic Neg liver ROS, GERD-  Medicated,  Endo/Other  negative endocrine ROS  Renal/GU negative Renal ROS     Musculoskeletal   Abdominal   Peds  Hematology   Anesthesia Other Findings   Reproductive/Obstetrics                           Anesthesia Physical Anesthesia Plan  ASA: III  Anesthesia Plan: General   Post-op Pain Management:    Induction: Intravenous  Airway Management Planned: Oral ETT  Additional Equipment:   Intra-op Plan:   Post-operative Plan: Extubation in OR  Informed Consent: I have reviewed the patients History and Physical, chart, labs and discussed the procedure including the risks, benefits and alternatives for the proposed anesthesia with the patient or authorized representative who has indicated his/her understanding and acceptance.   Dental advisory given  Plan Discussed with: CRNA, Anesthesiologist and Surgeon  Anesthesia Plan Comments:         Anesthesia Quick Evaluation

## 2014-07-25 NOTE — Discharge Instructions (Signed)
Diet: As you were doing prior to hospitalization   Shower:  Dygert shower but keep the wounds dry, use an occlusive plastic wrap, NO SOAKING IN TUB.  If the bandage gets wet, change with a clean dry gauze.  Dressing:  You [place] change your dressing 3-5 days after surgery.  Then change the dressing daily with sterile gauze dressing.    There are sticky tapes (steri-strips) on your wounds and all the stitches are absorbable.  Leave the steri-strips in place when changing your dressings, they will peel off with time, usually 2-3 weeks.  Activity:  Increase activity slowly as tolerated, but follow the weight bearing instructions below.  No lifting or driving for 6 weeks.  Weight Bearing:   Use arm as soon as comfortable.  No restrictions..    To prevent constipation: you [place] use a stool softener such as -  Colace (over the counter) 100 mg by mouth twice a day  Drink plenty of fluids (prune juice Estrin be helpful) and high fiber foods Miralax (over the counter) for constipation as needed.    Itching:  If you experience itching with your medications, try taking only a single pain pill, or even half a pain pill at a time.  You [place] take up to 10 pain pills per day, and you can also use benadryl over the counter for itching or also to help with sleep.   Precautions:  If you experience chest pain or shortness of breath - call 911 immediately for transfer to the hospital emergency department!!  If you develop a fever greater that 101 F, purulent drainage from wound, increased redness or drainage from wound, or calf pain -- Call the office at [phone]                                                Follow- Up Appointment:  Please call for an appointment to be seen in 2 weeks [place] - [phone]  Regional Anesthesia Blocks  1. Numbness or the inability to move the "blocked" extremity [place] last from 3-48 hours after placement. The length of time depends on the medication injected and your individual  response to the medication. If the numbness is not going away after 48 hours, call your surgeon.  2. The extremity that is blocked will need to be protected until the numbness is gone and the  Strength has returned. Because you cannot feel it, you will need to take extra care to avoid injury. Because it Klosowski be weak, you Rhudy have difficulty moving it or using it. You [place] not know what position it is in without looking at it while the block is in effect.  3. For blocks in the legs and feet, returning to weight bearing and walking needs to be done carefully. You will need to wait until the numbness is entirely gone and the strength has returned. You should be able to move your leg and foot normally before you try and bear weight or walk. You will need someone to be with you when you first try to ensure you do not fall and possibly risk injury.  4. Bruising and tenderness at the needle site are common side effects and will resolve in a few days.  5. Persistent numbness or new problems with movement should be communicated to the surgeon or the [facility] [phone]  [facility] [phone]).    Post Anesthesia Home Care Instructions  Activity: Get plenty of rest for the remainder of the day. A responsible adult should stay with you for 24 hours following the procedure.  For the next 24 hours, DO NOT: -Drive a car -Advertising copywriterperate machinery -Drink alcoholic beverages -Take any medication unless instructed by your physician -Make any legal decisions or sign important papers.  Meals: Start with liquid foods such as gelatin or soup. Progress to regular foods as tolerated. Avoid greasy, spicy, heavy foods. If nausea and/or vomiting occur, drink only clear liquids until the nausea and/or vomiting subsides. Call your physician if vomiting continues.  Special Instructions/Symptoms: Your throat [place] feel dry or sore from the anesthesia or the breathing tube placed in your throat  during surgery. If this causes discomfort, gargle with warm salt water. The discomfort should disappear within 24 hours.

## 2014-07-25 NOTE — Anesthesia Postprocedure Evaluation (Signed)
Anesthesia Post Note  Patient: [name]  Procedure(s) Performed: Procedure(s) (LRB): LEFT SHOULDER ARTHROSCOPY; DEBRIDEMENT,  DISTAL CLAVICULECTOMY, ACROMIOPLASTY  (Left)  Anesthesia type: general  Patient location: PACU  Post pain: Pain level controlled  Post assessment: Patient's Cardiovascular Status Stable  Last Vitals:  Filed Vitals:   07/25/14 1045  BP: 129/79  Pulse: 70  Temp:   Resp: 17    Post vital signs: Reviewed and stable  Level of consciousness: sedated  Complications: No apparent anesthesia complications

## 2014-07-25 NOTE — Transfer of Care (Signed)
Immediate Anesthesia Transfer of Care Note  Patient: [name]  Procedure(s) Performed: Procedure(s): LEFT SHOULDER ARTHROSCOPY; DEBRIDEMENT,  DISTAL CLAVICULECTOMY, ACROMIOPLASTY  (Left)  Patient Location: PACU  Anesthesia Type:GA combined with regional for post-op pain  Level of Consciousness: awake, sedated and patient cooperative  Airway & Oxygen Therapy: Patient Spontanous Breathing and Patient connected to face mask oxygen  Post-op Assessment: Report given to PACU RN and Post -op Vital signs reviewed and stable  Post vital signs: Reviewed and stable  Complications: No apparent anesthesia complications

## 2014-08-04 ENCOUNTER — Telehealth: Payer: Self-pay | Admitting: Internal Medicine

## 2014-08-04 MED ORDER — AZITHROMYCIN 250 MG PO TABS: ORAL_TABLET | ORAL | Status: DC

## 2014-08-04 NOTE — Telephone Encounter (Signed)
Ok Zpak

## 2014-08-04 NOTE — Telephone Encounter (Signed)
Spoke with pt's wife. Reports dry cough, chest congestion, SOB and chest tightness. Denies wheezing or fever. Would like Zpack called in.  Allergies  Allergen Reactions  . 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 Niacin And Related Other (See Comments)    unknown   CY - please advise. Thanks.

## 2014-08-04 NOTE — Telephone Encounter (Signed)
Called and spoke with pts wife and she is aware of rx that has been sent to the pharmacy for the pt.  Nothing further is needed.

## 2014-08-04 NOTE — Telephone Encounter (Signed)
LMTCB

## 2014-10-08 ENCOUNTER — Ambulatory Visit: Payer: Self-pay | Admitting: Nurse Practitioner

## 2014-11-28 NOTE — Op Note (Signed)
PATIENT NAME:  [name], [name] MR#:  [id number] DATE OF BIRTH:  1956-07-11  DATE OF PROCEDURE:  03/06/2013  PREOPERATIVE DIAGNOSIS: Dupuytren's contracture right palm and right thumb.   POSTOPERATIVE DIAGNOSIS: Dupuytren's contracture right palm and right thumb.   PROCEDURE: Excision of Dupuytren's contracture and fascia right palm and right thumb.  SURGEON: Myra Rude, M.D.   ANESTHESIA: General.   COMPLICATIONS: None.   TOURNIQUET TIME: Approximately 60 minutes.   PROCEDURE:  After adequate induction of general anesthesia, the right upper extremity is thoroughly prepped with alcohol and ChloraPrep and draped in standard sterile fashion. The extremity is wrapped out with the Esmarch bandage and pneumatic tourniquet elevated to 275 mmHg. Under loupe magnification, a standard volar zigzag incision is made. The dissection is carried down proximally and the proximal fascial released. The neurovascular bundles are identified on each side and then completely dissected out distally to the proximal aspect of the distal phalanx. The Dupuytren's fascia is then fully excised with careful preservation of the neurovascular bundles. There is seen to be complete release of the previous contracture. The wound is thoroughly irrigated multiple times. Skin edges are closed with 4-0 nylon. A soft bulky dressing is applied. The tourniquet is released. The patient is returned to the recovery room in satisfactory condition having tolerated the procedure quite well.    ____________________________ Clare Gandy, MD ces:dp D: 03/06/2013 10:15:12 ET T: 03/06/2013 10:22:05 ET JOB#: [id number]  cc: Clare Gandy, MD, <Dictator> Clare Gandy MD ELECTRONICALLY SIGNED 03/06/2013 13:13

## 2014-12-17 ENCOUNTER — Encounter: Payer: Self-pay | Admitting: Internal Medicine

## 2015-01-19 ENCOUNTER — Encounter: Payer: Self-pay | Admitting: Internal Medicine

## 2015-01-19 ENCOUNTER — Ambulatory Visit (INDEPENDENT_AMBULATORY_CARE_PROVIDER_SITE_OTHER): Payer: 59 | Admitting: Internal Medicine

## 2015-01-19 VITALS — BP 114/74 | HR 84 | Ht 68.0 in | Wt 204.0 lb

## 2015-01-19 DIAGNOSIS — J4520 Mild intermittent asthma, uncomplicated: Secondary | ICD-10-CM

## 2015-01-19 DIAGNOSIS — J309 Allergic rhinitis, unspecified: Secondary | ICD-10-CM

## 2015-01-19 DIAGNOSIS — G4700 Insomnia, unspecified: Secondary | ICD-10-CM

## 2015-01-19 DIAGNOSIS — J3089 Other allergic rhinitis: Secondary | ICD-10-CM

## 2015-01-19 DIAGNOSIS — J302 Other seasonal allergic rhinitis: Secondary | ICD-10-CM

## 2015-01-19 MED ORDER — FLUTICASONE PROPIONATE 50 MCG/ACT NA SUSP: NASAL | Status: DC

## 2015-01-19 MED ORDER — IPRATROPIUM BROMIDE 0.02 % IN SOLN: 0.5000 mg | Freq: Four times a day (QID) | RESPIRATORY_TRACT | Status: DC | PRN

## 2015-01-19 MED ORDER — EPINEPHRINE 0.3 MG/0.3ML IJ SOAJ: 0.3000 mg | Freq: Once | INTRAMUSCULAR | Status: DC | PRN

## 2015-01-19 MED ORDER — TRAZODONE HCL 100 MG PO TABS: 100.0000 mg | ORAL_TABLET | Freq: Every day | ORAL | Status: DC

## 2015-01-19 MED ORDER — ALBUTEROL SULFATE HFA 108 (90 BASE) MCG/ACT IN AERS: 2.0000 | INHALATION_SPRAY | Freq: Four times a day (QID) | RESPIRATORY_TRACT | Status: DC | PRN

## 2015-01-19 MED ORDER — LORATADINE 10 MG PO TABS: 10.0000 mg | ORAL_TABLET | Freq: Every day | ORAL | Status: DC

## 2015-01-19 MED ORDER — ALBUTEROL SULFATE (2.5 MG/3ML) 0.083% IN NEBU: 2.5000 mg | INHALATION_SOLUTION | Freq: Four times a day (QID) | RESPIRATORY_TRACT | Status: DC | PRN

## 2015-01-19 NOTE — Progress Notes (Signed)
05/29/11- 59 year old male former smoker followed for asthma, allergic rhinitis complicated by CAD, GERD.... wife here Last here- 10/13/2010 He had done well through the summer the last 2 or 3 days notes increasing sinus and chest congestion cough and thick white or trace yellow sputum. He denies headache, fever, sore throat. Coughing is making bilateral rib cage sore.  01/04/12- 59 year old male former smoker followed for asthma, allergic rhinitis complicated by CAD, GERD.... wife here Patient states about the same as last visit.  Pretty good spring with no acute issues until today. He woke this morning with some frontal headache, worse leaning over, little nasal discharge. Denies sore throat or sneeze. Ears okay. No cough or wheeze.  04/16/12- 59 year old male former smoker followed for asthma, allergic rhinitis complicated by CAD, GERD.... wife here ACUTE VISIT: cough since 03-06-12;dry hacky cough-has had 2 rounds of Zithromax, 1 prednisone taper and cough syrup-no better. Hard cough all summer, since at least mid July. Occasionally chokes while eating. Persistent sinus drip. Coughed triggers heartburn despite Nexium and we again discussed cyclical cough and the importance of controlling GERD. Short of breath at rest some upper anterior chest wall pressure which is nonexertional. Nebulizer treatments helps some.  09/18/12- 59 year old male former smoker followed for asthma, allergic rhinitis complicated by CAD, GERD.... wife here FOLLOWS FOR: dry hacky cough; only during the day; insurance will not cover Tussionex-needs different cough syrup Same dry cough since July with no acute event. Nebulizer does help using new duoneb. Frontal headache, always stuffy, scant clear nasal discharge. Denies any reflux or aspiration event. Medications reviewed. CXR 04/17/12-  IMPRESSION:  Status post CABG. No active disease.  Original Report Authenticated By: Natasha Mead, M.D.  11/01/12- 59 year old male former  smoker followed for asthma, allergic rhinitis complicated by CAD, GERD.... wife here FOLLOWS FOR: cough went away but came back about 2 days ago-dry and hacky cough.Wheezing, SOB as well. Felt well after neb/ depo LOV, until past week. C/o incr head and chest congestion, post-nasal drip, chest tight. Denies a cold or fever. Using his neb 1x daily, Symbicort, Rhinocort.  05/10/13- 59 year old male former smoker followed for asthma, allergic rhinitis complicated by CAD, GERD.... wife here FOLLOWS FOR: denies any flare up Has done well through the summer and now early fall with no exacerbation. We discussed medications and vaccination CXR 02/22/13 IMPRESSION:  Hypoaeration without focal pulmonary opacity.  Original Report Authenticated By: Christiana Pellant, M.D.  01/14/14- 19 yoM former smoker followed for asthma, allergic rhinitis complicated by CAD, GERD.... wife here FOLLOWS FOR: Pt states his breathing is unchanged. Pt states with little ambulation and during the night pt becomes SOB. C/o recent dry cough x 2-3 days. Pt states he notices the chest tightness when he coughs.   Does not have a cold, but it for the last 2 or 3 days has had increased cough and chest congestion especially while lying down. Does not notice reflux or ankle edema. Using rescue inhaler once or twice daily. No fever or purulent sputum. CXR 12/17/13 IMPRESSION:  1. No acute cardiopulmonary abnormality.  2. Sequelae of prior CABG.  Electronically Signed  By: Rise Mu M.D.  On: 12/17/2013 19:24  07/18/14- 24 yoM former smoker followed for asthma/ bronchitis, allergic rhinitis complicated by CAD, GERD.... wife here FOLLOWS FOR: Refill HFa inhaler,Epipen, flonase, Duoneb, and Trazodone. Pt would like RX for cough syrup as well. Has cough in am ocasionally at night. Head a CT scan of the chest because of food hanging up he says. Report  reviewed. Dry cough now with no chest pain, wheeze, fever. Sometimes gets substernal  burning without exertion and admits some heartburn. He is pending shoulder surgery. CTa chest 04/01/14 IMPRESSION: No evidence of significant pulmonary embolus. Low lung volumes with hazy infiltrates bilaterally suggesting edema, pneumonia, or atelectasis appear Electronically Signed  By: Burman Nieves M.D.  On: 04/01/2014 00:10  01/19/15- 56 yoM former smoker followed for asthma/ bronchitis, allergic rhinitis complicated by CAD, GERD.... wife here Reports: pt presents with no complaints today.   He has felt recently quite well without acute episodes. Needs medications refilled which we discussed.  ROS-HPI Constitutional:   No-   weight loss, night sweats, fevers, chills, fatigue, lassitude. HEENT:   No-  headaches, difficulty swallowing, tooth/dental problems,  No-sore throat,       No-sneezing, itching, ear ache, nasal congestion, no-post nasal drip,  CV:  No-  Anginal  chest pain, orthopnea, PND, swelling in lower extremities, anasarca, dizziness, palpitations Resp: +   shortness of breath with exertion or at rest.              No- productive cough, +-non-productive cough,  No- coughing up of blood.              No-   change in color of mucus.  No- wheezing.   Skin: No-   rash or lesions. GI:  No-heartburn, indigestion, abdominal pain, nausea, vomiting,  GU:  MS: +  joint pain or swelling.  Neuro-     nothing unusual Psych:  No- change in mood or affect. No depression or anxiety.  No memory loss.  OBJ General- Alert, Oriented, Affect-appropriate, Distress- none acute, laconic, obese Skin- rash-none, lesions- none, excoriation- none Lymphadenopathy- none Head- atraumatic            Eyes- Gross vision intact, PERRLA, conjunctivae clear secretions            Ears- Hearing, canals-normal            Nose-  , no-Septal dev, polyps, erosion, perforation             Throat- Mallampati II-III , mucosa clear , drainage- none, tonsils- atrophic Neck- flexible , trachea midline, no  stridor , thyroid nl, carotid no bruit Chest - symmetrical excursion , unlabored           Heart/CV- RRR , no murmur , no gallop  , no rub, nl s1 s2                           - JVD- none , edema- none, stasis changes- none, varices- none           Lung- clear,  dullness-none, rub- none, cough+ mild, wheeze-none           Chest wall- + sternotomy scar Abd-  Br/ Gen/ Rectal- Not done, not indicated Extrem- cyanosis- none, clubbing, none, atrophy- none, strength- nl Neuro- grossly intact to observation

## 2015-01-19 NOTE — Patient Instructions (Signed)
Scripts sent for refills  Please call as needed

## 2015-02-16 NOTE — Assessment & Plan Note (Signed)
Adequate symptom control using Flonase and loratadine

## 2015-02-16 NOTE — Assessment & Plan Note (Signed)
Using rescue inhaler once or twice daily without wake after sleep onset

## 2015-02-16 NOTE — Assessment & Plan Note (Signed)
Using trazodone 100 mg at bedtime when needed has been effective without morning hangover

## 2015-04-09 ENCOUNTER — Telehealth: Payer: Self-pay | Admitting: Internal Medicine

## 2015-04-09 NOTE — Telephone Encounter (Signed)
Rx trazodone 100 mg, # 45, 1 and 1/2 tab at bedtime for sleep  Refill 5

## 2015-04-09 NOTE — Telephone Encounter (Signed)
lmtcb for pt's wife 

## 2015-04-09 NOTE — Telephone Encounter (Signed)
Called spoke with pt spouse Mrs. Triplett. She reports the past month they started giving pt trazodone 150 mg at bedtime. Now they have ran out early on the trazodone 100 mg as Dr. Maple Hudson wrote for this to be taken only 1 tablet at bedtime. They are asking for a new RX for increase dosage. Please advise Dr. Maple Hudson thanks

## 2015-04-10 MED ORDER — TRAZODONE HCL 100 MG PO TABS: ORAL_TABLET | ORAL | Status: DC

## 2015-04-10 NOTE — Telephone Encounter (Signed)
Attempted to contact [facility] to call in Rx, but kept ringing busy Printed Rx, had signed and faxed to [facility] Patients wife notified. Nothing further needed.

## 2015-04-21 ENCOUNTER — Telehealth: Payer: Self-pay | Admitting: Internal Medicine

## 2015-04-21 MED ORDER — HYDROCOD POLST-CPM POLST ER 10-8 MG/5ML PO SUER: 5.0000 mL | Freq: Two times a day (BID) | ORAL | Status: DC | PRN

## 2015-04-21 MED ORDER — AZITHROMYCIN 250 MG PO TABS: ORAL_TABLET | ORAL | Status: DC

## 2015-04-21 NOTE — Telephone Encounter (Signed)
Rx printed and put on CY's cart for signature Patient's wife notified and will pick up tomorrow Just needs to go up front to be picked up   To Katie/CY

## 2015-04-21 NOTE — Telephone Encounter (Signed)
Called spoke with pt spouse. Pt has a cough (unsure if it is productive), congested, sinus HA, feels warm and clammy. Unsure if pt has fever. Please advise Dr. Maple Hudson thanks  Allergies  Allergen Reactions  . 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 Niacin And Related Other (See Comments)    unknown     Current Outpatient Prescriptions on File Prior to Visit  Medication Sig Dispense Refill  . albuterol (PROVENTIL HFA) 108 (90 BASE) MCG/ACT inhaler Inhale 2 puffs into the lungs every 6 (six) hours as needed for wheezing or shortness of breath. 1 Inhaler prn  . albuterol (PROVENTIL) (2.5 MG/3ML) 0.083% nebulizer solution Take 3 mLs (2.5 mg total) by nebulization every 6 (six) hours as needed for wheezing or shortness of breath. 150 mL 12  . azithromycin (ZITHROMAX) 250 MG tablet Take as directed 6 tablet 0  . chlorpheniramine-HYDROcodone (TUSSIONEX PENNKINETIC ER) 10-8 MG/5ML LQCR 5 ml ever 12 hours if needed for cough 230 mL 0  . EPINEPHrine 0.3 mg/0.3 mL IJ SOAJ injection Inject 0.3 mLs (0.3 mg total) into the muscle once as needed (for allergic reaction). 1 Device prn  . esomeprazole (NEXIUM) 20 MG capsule Take 20 mg by mouth daily at 12 noon.    . ezetimibe-simvastatin (VYTORIN) 10-10 MG per tablet Take 1 tablet by mouth daily.     . fluticasone (FLONASE) 50 MCG/ACT nasal spray USE TWO SPRAY(S) IN EACH NOSTRIL ONCE DAILY 16 g prn  . gabapentin (NEURONTIN) 300 MG capsule Take 300 mg by mouth 2 (two) times daily.    Marland Kitchen ipratropium (ATROVENT) 0.02 % nebulizer solution Take 2.5 mLs (0.5 mg total) by nebulization every 6 (six) hours as needed for wheezing or shortness of breath. 150 mL 12  . loratadine (CLARITIN) 10 MG tablet Take 1 tablet (10 mg total) by mouth daily. 30 tablet 11  . magnesium oxide (MAG-OX) 400 MG tablet Take 800 mg by mouth daily.     .  metoprolol succinate (TOPROL-XL) 25 MG 24 hr tablet Take 25 mg by mouth daily.     . nitroGLYCERIN (NITROSTAT) 0.4 MG SL tablet Place 0.4 mg under the tongue every 5 (five) minutes as needed for chest pain.     Marland Kitchen sennosides-docusate sodium (SENOKOT-S) 8.6-50 MG tablet Take 2 tablets by mouth daily. 30 tablet 1  . tadalafil (CIALIS) 5 MG tablet Take 5 mg by mouth every other day.     . Testosterone (ANDROGEL) 20.25 MG/1.25GM (1.62%) GEL Place 1-2 application onto the skin daily. Apply 1-2 squirts to each shoulder    . traZODone (DESYREL) 100 MG tablet Take 1 1/2 tablets at bedtime 45 tablet 5   No current facility-administered medications on file prior to visit.

## 2015-04-21 NOTE — Telephone Encounter (Signed)
Pt wife returned call and I let her know that scrpit  Sen to  Pharm.Caren Griffins

## 2015-04-21 NOTE — Telephone Encounter (Signed)
Ok Rx Tussionex 200 ml,   5 ml every 12 hours if needed for cough

## 2015-04-21 NOTE — Telephone Encounter (Signed)
Called spoke with spouse. She wants to know if pt can have RX for tussionex. Aware she will get a call back tomorrow as this has to be signed and pick up. She was fine with this. Please advise Dr. Maple Hudson thanks

## 2015-04-21 NOTE — Telephone Encounter (Signed)
Offer Z pak and recommend extra fluids

## 2015-04-21 NOTE — Telephone Encounter (Signed)
lmomtcb x1 for pt spouse RX for ZPAK sent

## 2015-04-22 NOTE — Telephone Encounter (Signed)
Rx placed at front for pick up.

## 2015-04-27 IMAGING — CR DG FOOT COMPLETE 3+V*L*
1 series · 3 of 3 positions shown · non-contrast
Comparison: None.

CLINICAL DATA: Left foot pain.

EXAM:
LEFT FOOT - COMPLETE 3+ VIEW

[Series 1: kdxr foot lt comp w/obliques · 0.14mm/px · 3 of 3 slices shown]
[im 1/3]
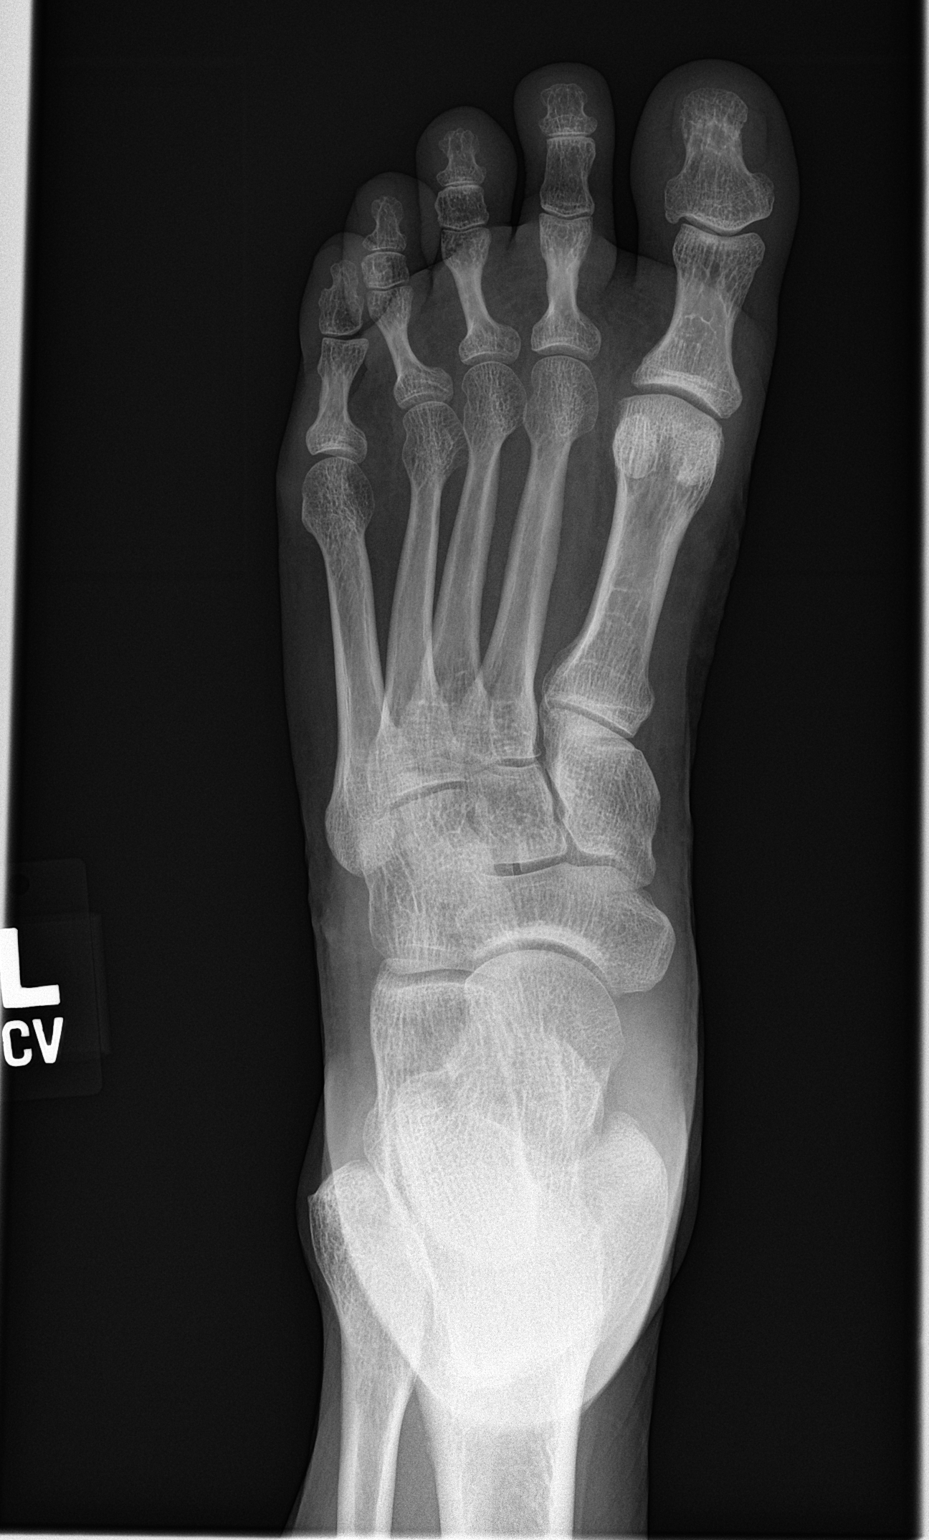
[im 2/3]
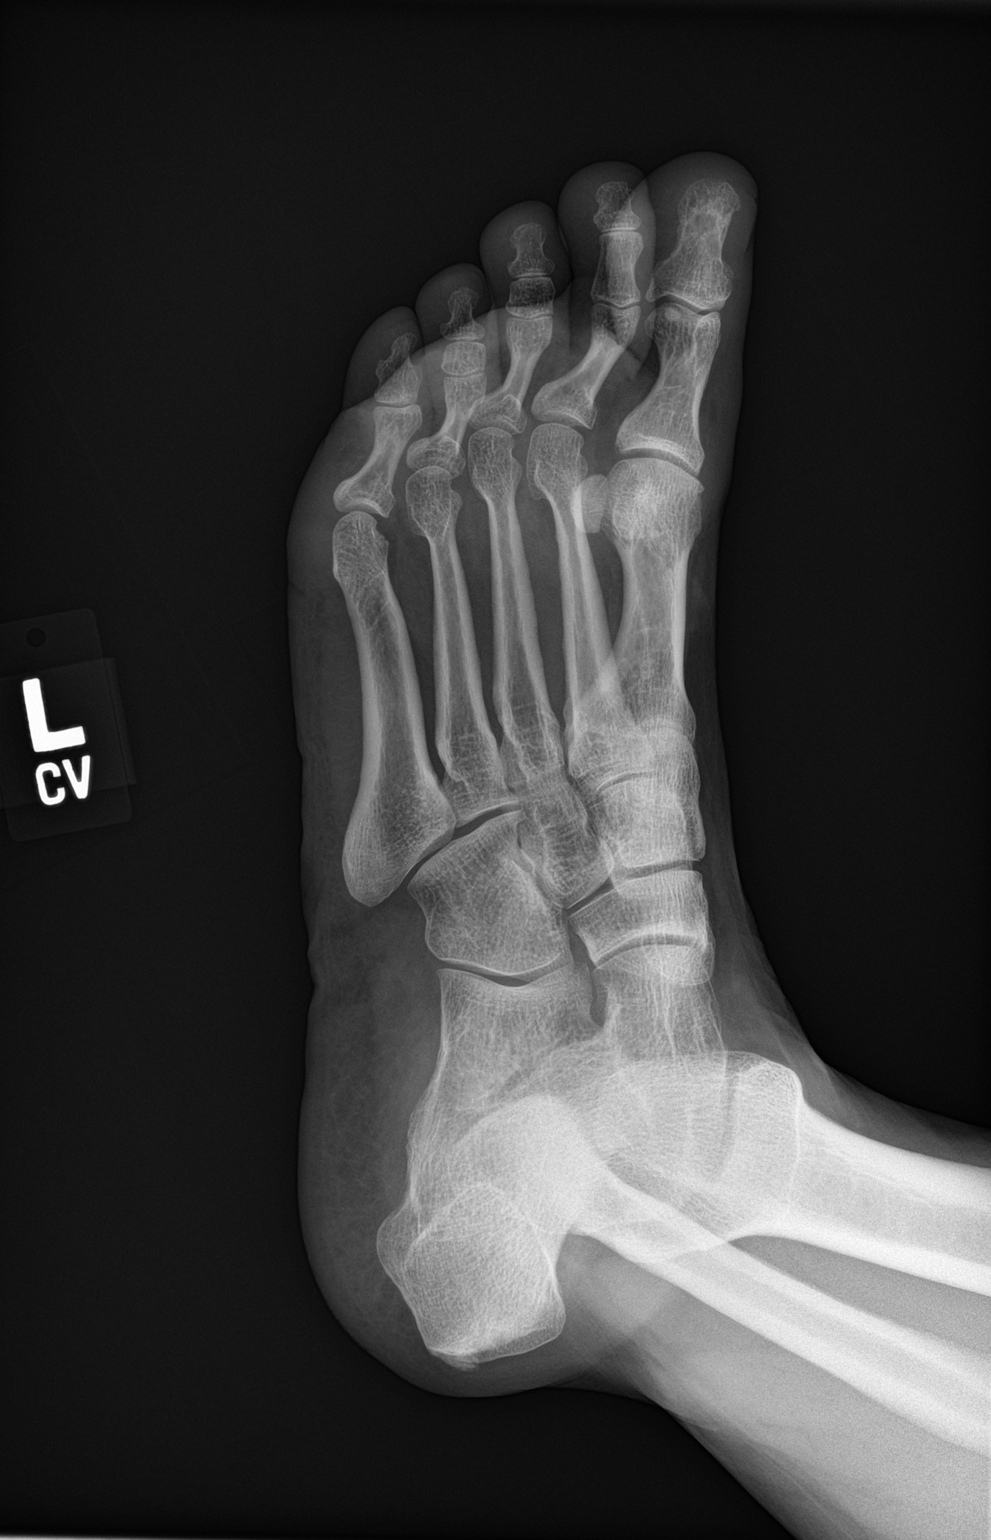
[im 3/3]
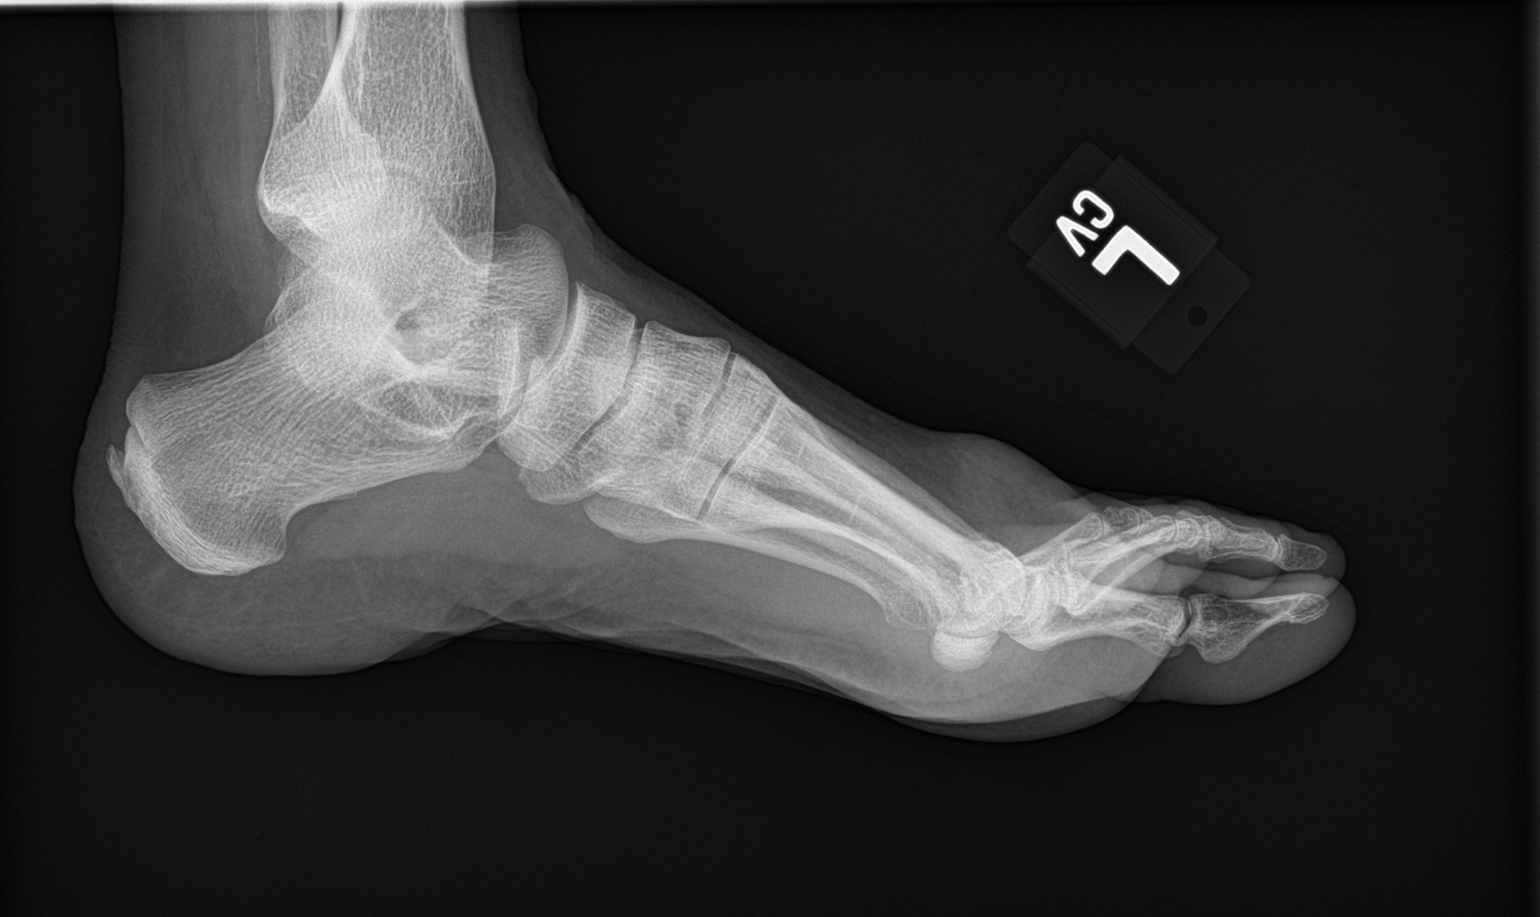

[3 of 3 positions shown; findings below may reference images not displayed]

FINDINGS: There is no evidence of fracture or dislocation. There is no
evidence of arthropathy or other focal bone abnormality. Soft
tissues are unremarkable.
IMPRESSION: Negative.

## 2015-05-13 ENCOUNTER — Other Ambulatory Visit: Payer: Self-pay | Admitting: Orthopedic Surgery

## 2015-05-13 DIAGNOSIS — M25511 Pain in right shoulder: Secondary | ICD-10-CM

## 2015-05-29 ENCOUNTER — Other Ambulatory Visit: Payer: 59

## 2015-06-07 ENCOUNTER — Ambulatory Visit
Admission: RE | Admit: 2015-06-07 | Discharge: 2015-06-07 | Disposition: A | Discharge status: Home / Self Care | Payer: 59 | Source: Ambulatory Visit | Attending: Orthopedic Surgery | Admitting: Orthopedic Surgery

## 2015-06-07 DIAGNOSIS — M25511 Pain in right shoulder: Secondary | ICD-10-CM

## 2015-06-23 ENCOUNTER — Other Ambulatory Visit: Payer: Self-pay | Admitting: Internal Medicine

## 2015-06-24 NOTE — Telephone Encounter (Signed)
Ok to refill z pak

## 2015-06-24 NOTE — Telephone Encounter (Signed)
CY Please advise if okay to refill; pt has pending OV on 07/07/15 for f/u. Thanks.

## 2015-07-07 ENCOUNTER — Ambulatory Visit (INDEPENDENT_AMBULATORY_CARE_PROVIDER_SITE_OTHER)
Admission: RE | Admit: 2015-07-07 | Discharge: 2015-07-07 | Disposition: A | Discharge status: Home / Self Care | Payer: 59 | Source: Ambulatory Visit | Attending: Internal Medicine | Admitting: Internal Medicine

## 2015-07-07 ENCOUNTER — Encounter: Payer: Self-pay | Admitting: Internal Medicine

## 2015-07-07 ENCOUNTER — Ambulatory Visit (INDEPENDENT_AMBULATORY_CARE_PROVIDER_SITE_OTHER): Payer: 59 | Admitting: Internal Medicine

## 2015-07-07 VITALS — BP 134/62 | HR 65 | Ht 68.0 in | Wt 209.2 lb

## 2015-07-07 DIAGNOSIS — J4530 Mild persistent asthma, uncomplicated: Secondary | ICD-10-CM | POA: Diagnosis not present

## 2015-07-07 DIAGNOSIS — J302 Other seasonal allergic rhinitis: Secondary | ICD-10-CM

## 2015-07-07 DIAGNOSIS — J4520 Mild intermittent asthma, uncomplicated: Secondary | ICD-10-CM

## 2015-07-07 DIAGNOSIS — J309 Allergic rhinitis, unspecified: Secondary | ICD-10-CM | POA: Diagnosis not present

## 2015-07-07 DIAGNOSIS — J3089 Other allergic rhinitis: Secondary | ICD-10-CM

## 2015-07-07 MED ORDER — HYDROCOD POLST-CPM POLST ER 10-8 MG/5ML PO SUER: 5.0000 mL | Freq: Two times a day (BID) | ORAL | Status: DC | PRN

## 2015-07-07 NOTE — Assessment & Plan Note (Signed)
Now past fall pollen season. He is not experiencing significant nasal symptoms. Well controlled.

## 2015-07-07 NOTE — Assessment & Plan Note (Signed)
Chronic slight dry cough but no recent exacerbation. Initial review of chest x-ray today shows no acute process. I don't see a pulmonary contraindication to planned shoulder surgery

## 2015-07-07 NOTE — Patient Instructions (Addendum)
Order- office spirometry   Dx asthma mild persistent  Order- CXR  Script for tussionex cough syrup- Can be habit forming so use sparingly  I see no pulmonary concerns for planned surgery at this time

## 2015-07-07 NOTE — Progress Notes (Signed)
05/29/11- 59 year old male former smoker followed for asthma, allergic rhinitis complicated by CAD, GERD.... wife here Last here- 10/13/2010 He had done well through the summer the last 2 or 3 days notes increasing sinus and chest congestion cough and thick white or trace yellow sputum. He denies headache, fever, sore throat. Coughing is making bilateral rib cage sore.  01/04/12- 59 year old male former smoker followed for asthma, allergic rhinitis complicated by CAD, GERD.... wife here Patient states about the same as last visit.  Pretty good spring with no acute issues until today. He woke this morning with some frontal headache, worse leaning over, little nasal discharge. Denies sore throat or sneeze. Ears okay. No cough or wheeze.  04/16/12- 59 year old male former smoker followed for asthma, allergic rhinitis complicated by CAD, GERD.... wife here ACUTE VISIT: cough since 03-06-12;dry hacky cough-has had 2 rounds of Zithromax, 1 prednisone taper and cough syrup-no better. Hard cough all summer, since at least mid July. Occasionally chokes while eating. Persistent sinus drip. Coughed triggers heartburn despite Nexium and we again discussed cyclical cough and the importance of controlling GERD. Short of breath at rest some upper anterior chest wall pressure which is nonexertional. Nebulizer treatments helps some.  09/18/12- 59 year old male former smoker followed for asthma, allergic rhinitis complicated by CAD, GERD.... wife here FOLLOWS FOR: dry hacky cough; only during the day; insurance will not cover Tussionex-needs different cough syrup Same dry cough since July with no acute event. Nebulizer does help using new duoneb. Frontal headache, always stuffy, scant clear nasal discharge. Denies any reflux or aspiration event. Medications reviewed. CXR 04/17/12-  IMPRESSION:  Status post CABG. No active disease.  Original Report Authenticated By: Natasha Mead, M.D.  11/01/12- 59 year old male former  smoker followed for asthma, allergic rhinitis complicated by CAD, GERD.... wife here FOLLOWS FOR: cough went away but came back about 2 days ago-dry and hacky cough.Wheezing, SOB as well. Felt well after neb/ depo LOV, until past week. C/o incr head and chest congestion, post-nasal drip, chest tight. Denies a cold or fever. Using his neb 1x daily, Symbicort, Rhinocort.  05/10/13- 59 year old male former smoker followed for asthma, allergic rhinitis complicated by CAD, GERD.... wife here FOLLOWS FOR: denies any flare up Has done well through the summer and now early fall with no exacerbation. We discussed medications and vaccination CXR 02/22/13 IMPRESSION:  Hypoaeration without focal pulmonary opacity.  Original Report Authenticated By: Christiana Pellant, M.D.  01/14/14- 61 yoM former smoker followed for asthma, allergic rhinitis complicated by CAD, GERD.... wife here FOLLOWS FOR: Pt states his breathing is unchanged. Pt states with little ambulation and during the night pt becomes SOB. C/o recent dry cough x 2-3 days. Pt states he notices the chest tightness when he coughs.   Does not have a cold, but it for the last 2 or 3 days has had increased cough and chest congestion especially while lying down. Does not notice reflux or ankle edema. Using rescue inhaler once or twice daily. No fever or purulent sputum. CXR 12/17/13 IMPRESSION:  1. No acute cardiopulmonary abnormality.  2. Sequelae of prior CABG.  Electronically Signed  By: Rise Mu M.D.  On: 12/17/2013 19:24  07/18/14- 27 yoM former smoker followed for asthma/ bronchitis, allergic rhinitis complicated by CAD, GERD.... wife here FOLLOWS FOR: Refill HFa inhaler,Epipen, flonase, Duoneb, and Trazodone. Pt would like RX for cough syrup as well. Has cough in am ocasionally at night. Head a CT scan of the chest because of food hanging up he says. Report  reviewed. Dry cough now with no chest pain, wheeze, fever. Sometimes gets substernal  burning without exertion and admits some heartburn. He is pending shoulder surgery. CTa chest 04/01/14 IMPRESSION: No evidence of significant pulmonary embolus. Low lung volumes with hazy infiltrates bilaterally suggesting edema, pneumonia, or atelectasis appear Electronically Signed  By: Burman Nieves M.D.  On: 04/01/2014 00:10  01/19/15- 55 yoM former smoker followed for asthma/ bronchitis, allergic rhinitis complicated by CAD, GERD.... wife here Reports: pt presents with no complaints today.   He has felt recently quite well without acute episodes. Needs medications refilled which we discussed.  07/07/2015-59 year old male former smoker followed for asthma/bronchitis, allergic rhinitis complicated by CAD, GERD FOLLOWS FOR:  Breathing is unchanged.   Wife here       flu shot UTD Pending right shoulder repair/ Dr Dion Saucier Breathing unchanged recently with no acute events. Wife had a cold but he avoided. Using rescue inhaler about twice daily with recent weather change. Light cough with little sputum. No chest pain or palpitation area asks refill cough syrup to keep on hand-discussed. Office Spirometry 07/07/2015-mild restriction consistent with his abdominal obesity. FVC 3.28/74%, FEV1 2.71/78%, FEV1/FVC 0.83  ROS-HPI Constitutional:   No-   weight loss, night sweats, fevers, chills, fatigue, lassitude. HEENT:   No-  headaches, difficulty swallowing, tooth/dental problems,  No-sore throat,       No-sneezing, itching, ear ache, nasal congestion, no-post nasal drip,  CV:  No-  Anginal  chest pain, orthopnea, PND, swelling in lower extremities, anasarca, dizziness, palpitations Resp: +   shortness of breath with exertion or at rest.              No- productive cough, +-non-productive cough,  No- coughing up of blood.              No-   change in color of mucus.  No- wheezing.   Skin: No-   rash or lesions. GI:  No-heartburn, indigestion, abdominal pain, nausea, vomiting,  GU:  MS: +   joint pain or swelling.  Neuro-     nothing unusual Psych:  No- change in mood or affect. No depression or anxiety.  No memory loss.  OBJ General- Alert, Oriented, Affect-appropriate, Distress- none acute, laconic, + obese Skin- rash-none, lesions- none, excoriation- none Lymphadenopathy- none Head- atraumatic            Eyes- Gross vision intact, PERRLA, conjunctivae clear secretions            Ears- Hearing, canals-normal            Nose-  , no-Septal dev, polyps, erosion, perforation             Throat- Mallampati II-III , mucosa clear , drainage- none, tonsils- atrophic Neck- flexible , trachea midline, no stridor , thyroid nl, carotid no bruit Chest - symmetrical excursion , unlabored           Heart/CV- RRR , no murmur , no gallop  , no rub, nl s1 s2                           - JVD- none , edema- none, stasis changes- none, varices- none           Lung- clear,  dullness-none, rub- none, cough-none, wheeze-none           Chest wall- + sternotomy scar Abd-  Br/ Gen/ Rectal- Not done, not indicated Extrem- cyanosis- none, clubbing, none, atrophy- none,  strength- nl Neuro- grossly intact to observation

## 2015-07-08 NOTE — Progress Notes (Signed)
Quick Note:  Contacted pt, spoke to pts wife regarding results per CY Expressed understanding, nothing further needed ______

## 2015-07-17 ENCOUNTER — Other Ambulatory Visit: Payer: Self-pay | Admitting: Orthopedic Surgery

## 2015-07-21 ENCOUNTER — Ambulatory Visit: Payer: 59 | Admitting: Internal Medicine

## 2015-07-31 ENCOUNTER — Encounter (HOSPITAL_BASED_OUTPATIENT_CLINIC_OR_DEPARTMENT_OTHER): Payer: Self-pay

## 2015-07-31 ENCOUNTER — Ambulatory Visit (HOSPITAL_BASED_OUTPATIENT_CLINIC_OR_DEPARTMENT_OTHER): Admit: 2015-07-31 | Payer: 59 | Admitting: Orthopedic Surgery

## 2015-07-31 SURGERY — SHOULDER ARTHROSCOPY WITH SUBACROMIAL DECOMPRESSION, ROTATOR CUFF REPAIR AND BICEP TENDON REPAIR
Anesthesia: General | Laterality: Right

## 2015-08-04 ENCOUNTER — Encounter (HOSPITAL_COMMUNITY): Payer: Self-pay | Admitting: *Deleted

## 2015-08-04 ENCOUNTER — Emergency Department (HOSPITAL_COMMUNITY): Payer: 59

## 2015-08-04 ENCOUNTER — Emergency Department (HOSPITAL_COMMUNITY)
Admission: EM | Admit: 2015-08-04 | Discharge: 2015-08-05 | Disposition: A | Discharge status: Home / Self Care | Payer: 59 | Attending: Emergency Medicine | Admitting: Emergency Medicine

## 2015-08-04 DIAGNOSIS — I252 Old myocardial infarction: Secondary | ICD-10-CM | POA: Insufficient documentation

## 2015-08-04 DIAGNOSIS — S20302A Unspecified superficial injuries of left front wall of thorax, initial encounter: Secondary | ICD-10-CM | POA: Diagnosis not present

## 2015-08-04 DIAGNOSIS — Z23 Encounter for immunization: Secondary | ICD-10-CM | POA: Insufficient documentation

## 2015-08-04 DIAGNOSIS — I2510 Atherosclerotic heart disease of native coronary artery without angina pectoris: Secondary | ICD-10-CM | POA: Diagnosis not present

## 2015-08-04 DIAGNOSIS — K219 Gastro-esophageal reflux disease without esophagitis: Secondary | ICD-10-CM | POA: Diagnosis not present

## 2015-08-04 DIAGNOSIS — S199XXA Unspecified injury of neck, initial encounter: Secondary | ICD-10-CM | POA: Diagnosis present

## 2015-08-04 DIAGNOSIS — J45909 Unspecified asthma, uncomplicated: Secondary | ICD-10-CM | POA: Insufficient documentation

## 2015-08-04 DIAGNOSIS — Y998 Other external cause status: Secondary | ICD-10-CM | POA: Insufficient documentation

## 2015-08-04 DIAGNOSIS — M1990 Unspecified osteoarthritis, unspecified site: Secondary | ICD-10-CM | POA: Insufficient documentation

## 2015-08-04 DIAGNOSIS — I10 Essential (primary) hypertension: Secondary | ICD-10-CM | POA: Diagnosis not present

## 2015-08-04 DIAGNOSIS — Z880 Allergy status to penicillin: Secondary | ICD-10-CM | POA: Insufficient documentation

## 2015-08-04 DIAGNOSIS — S20301A Unspecified superficial injuries of right front wall of thorax, initial encounter: Secondary | ICD-10-CM | POA: Diagnosis not present

## 2015-08-04 DIAGNOSIS — Y9389 Activity, other specified: Secondary | ICD-10-CM | POA: Insufficient documentation

## 2015-08-04 DIAGNOSIS — Y92410 Unspecified street and highway as the place of occurrence of the external cause: Secondary | ICD-10-CM | POA: Insufficient documentation

## 2015-08-04 DIAGNOSIS — T07XXXA Unspecified multiple injuries, initial encounter: Secondary | ICD-10-CM

## 2015-08-04 DIAGNOSIS — Z87891 Personal history of nicotine dependence: Secondary | ICD-10-CM | POA: Insufficient documentation

## 2015-08-04 DIAGNOSIS — Z79899 Other long term (current) drug therapy: Secondary | ICD-10-CM | POA: Diagnosis not present

## 2015-08-04 DIAGNOSIS — E782 Mixed hyperlipidemia: Secondary | ICD-10-CM | POA: Insufficient documentation

## 2015-08-04 MED ORDER — OXYCODONE-ACETAMINOPHEN 5-325 MG PO TABS
1.0000 | ORAL_TABLET | Freq: Once | ORAL | Status: AC
  Administered 2015-08-04: 1 via ORAL
  Filled 2015-08-04: qty 1

## 2015-08-04 MED ORDER — OXYCODONE-ACETAMINOPHEN 5-325 MG PO TABS: 1.0000 | ORAL_TABLET | Freq: Four times a day (QID) | ORAL | Status: DC | PRN

## 2015-08-04 MED ORDER — TETANUS-DIPHTH-ACELL PERTUSSIS 5-2.5-18.5 LF-MCG/0.5 IM SUSP
0.5000 mL | Freq: Once | INTRAMUSCULAR | Status: AC
  Administered 2015-08-04: 0.5 mL via INTRAMUSCULAR
  Filled 2015-08-04: qty 0.5

## 2015-08-04 NOTE — ED Notes (Signed)
Taken to radiology department at this time. 
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
## 2015-08-04 NOTE — Discharge Instructions (Signed)
Contusion °A contusion is a deep bruise. Contusions are the result of a blunt injury to tissues and muscle fibers under the skin. The injury causes bleeding under the skin. The skin overlying the contusion Ringer turn blue, purple, or yellow. Minor injuries will give you a painless contusion, but more severe contusions Douglas stay painful and swollen for a few weeks.  °CAUSES  °This condition is usually caused by a blow, trauma, or direct force to an area of the body. °SYMPTOMS  °Symptoms of this condition include: °· Swelling of the injured area. °· Pain and tenderness in the injured area. °· Discoloration. The area Parr have redness and then turn blue, purple, or yellow. °DIAGNOSIS  °This condition is diagnosed based on a physical exam and medical history. An X-ray, CT scan, or MRI Chaidez be needed to determine if there are any associated injuries, such as broken bones (fractures). °TREATMENT  °Specific treatment for this condition depends on what area of the body was injured. In general, the best treatment for a contusion is resting, icing, applying pressure to (compression), and elevating the injured area. This is often called the RICE strategy. Over-the-counter anti-inflammatory medicines Bechtold also be recommended for pain control.  °HOME CARE INSTRUCTIONS  °· Rest the injured area. °· If directed, apply ice to the injured area: °· Put ice in a plastic bag. °· Place a towel between your skin and the bag. °· Leave the ice on for 20 minutes, 2-3 times per day. °· If directed, apply light compression to the injured area using an elastic bandage. Make sure the bandage is not wrapped too tightly. Remove and reapply the bandage as directed by your health care provider. °· If possible, raise (elevate) the injured area above the level of your heart while you are sitting or lying down. °· Take over-the-counter and prescription medicines only as told by your health care provider. °SEEK MEDICAL CARE IF: °· Your symptoms do not  improve after several days of treatment. °· Your symptoms get worse. °· You have difficulty moving the injured area. °SEEK IMMEDIATE MEDICAL CARE IF:  °· You have severe pain. °· You have numbness in a hand or foot. °· Your hand or foot turns pale or cold. °  °This information is not intended to replace advice given to you by your health care provider. Make sure you discuss any questions you have with your health care provider. °  °Document Released: 05/04/2005 Document Revised: 04/15/2015 Document Reviewed: 12/10/2014 °Elsevier Interactive Patient Education ©2016 [employer] ° °Motor Vehicle Collision °It is common to have multiple bruises and sore muscles after a motor vehicle collision (MVC). These tend to feel worse for the first 24 hours. You Pieper have the most stiffness and soreness over the first several hours. You Peggs also feel worse when you wake up the first morning after your collision. After this point, you will usually begin to improve with each day. The speed of improvement often depends on the severity of the collision, the number of injuries, and the location and nature of these injuries. °HOME CARE INSTRUCTIONS °· Put ice on the injured area. °¨ Put ice in a plastic bag. °¨ Place a towel between your skin and the bag. °¨ Leave the ice on for 15-20 minutes, 3-4 times a day, or as directed by your health care provider. °· Drink enough fluids to keep your urine clear or pale yellow. Do not drink alcohol. °· Take a warm shower or bath once or twice a   day. This will increase blood flow to sore muscles. °· You Kimura return to activities as directed by your caregiver. Be careful when lifting, as this Penalver aggravate neck or back pain. °· Only take over-the-counter or prescription medicines for pain, discomfort, or fever as directed by your caregiver. Do not use aspirin. This Duren increase bruising and bleeding. °SEEK IMMEDIATE MEDICAL CARE IF: °· You have numbness, tingling, or weakness in the arms or  legs. °· You develop severe headaches not relieved with medicine. °· You have severe neck pain, especially tenderness in the middle of the back of your neck. °· You have changes in bowel or bladder control. °· There is increasing pain in any area of the body. °· You have shortness of breath, light-headedness, dizziness, or fainting. °· You have chest pain. °· You feel sick to your stomach (nauseous), throw up (vomit), or sweat. °· You have increasing abdominal discomfort. °· There is blood in your urine, stool, or vomit. °· You have pain in your shoulder (shoulder strap areas). °· You feel your symptoms are getting worse. °MAKE SURE YOU: °· Understand these instructions. °· Will watch your condition. °· Will get help right away if you are not doing well or get worse. °  °This information is not intended to replace advice given to you by your health care provider. Make sure you discuss any questions you have with your health care provider. °  °Document Released: 07/25/2005 Document Revised: 08/15/2014 Document Reviewed: 12/22/2010 °Elsevier Interactive Patient Education ©2016 [employer] ° °

## 2015-08-04 NOTE — ED Notes (Signed)
Taken back to xray department at this time.
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
## 2015-09-23 ENCOUNTER — Other Ambulatory Visit: Payer: Self-pay | Admitting: Orthopedic Surgery

## 2015-09-23 DIAGNOSIS — M545 Low back pain: Secondary | ICD-10-CM

## 2015-10-01 ENCOUNTER — Ambulatory Visit
Admission: RE | Admit: 2015-10-01 | Discharge: 2015-10-01 | Disposition: A | Discharge status: Home / Self Care | Payer: BLUE CROSS/BLUE SHIELD | Source: Ambulatory Visit | Attending: Orthopedic Surgery | Admitting: Orthopedic Surgery

## 2015-10-01 ENCOUNTER — Other Ambulatory Visit: Payer: Self-pay

## 2015-10-01 DIAGNOSIS — M545 Low back pain: Secondary | ICD-10-CM

## 2015-10-09 ENCOUNTER — Telehealth: Payer: Self-pay | Admitting: Internal Medicine

## 2015-10-09 MED ORDER — AZITHROMYCIN 250 MG PO TABS: ORAL_TABLET | ORAL | Status: DC

## 2015-10-09 MED ORDER — HYDROCOD POLST-CPM POLST ER 10-8 MG/5ML PO SUER: 5.0000 mL | Freq: Every evening | ORAL | Status: DC | PRN

## 2015-10-09 MED ORDER — HYDROCOD POLST-CPM POLST ER 10-8 MG/5ML PO SUER: 5.0000 mL | Freq: Two times a day (BID) | ORAL | Status: DC | PRN

## 2015-10-09 NOTE — Telephone Encounter (Signed)
Spoke with pt's wife in the lobby. She would like a prescription for Tussionex and a Zpack. States that the pt is complaining of sore throat, cough. Cough is non productive. Denies chest tightness, wheezing, SOB or fever. Onset of symptoms was 2-3 days ago. Pt's wife is going to wait on these prescriptions. CY - please advise. Thanks.  Allergies  Allergen Reactions  . 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 Niacin And Related Other (See Comments)    unknown   Current Outpatient Prescriptions on File Prior to Visit  Medication Sig Dispense Refill  . albuterol (PROVENTIL HFA) 108 (90 BASE) MCG/ACT inhaler Inhale 2 puffs into the lungs every 6 (six) hours as needed for wheezing or shortness of breath. 1 Inhaler prn  . albuterol (PROVENTIL) (2.5 MG/3ML) 0.083% nebulizer solution Take 3 mLs (2.5 mg total) by nebulization every 6 (six) hours as needed for wheezing or shortness of breath. 150 mL 12  . aspirin 81 MG tablet Take 81 mg by mouth daily.    . chlorpheniramine-HYDROcodone (TUSSIONEX PENNKINETIC ER) 10-8 MG/5ML SUER Take 5 mLs by mouth every 12 (twelve) hours as needed for cough. 230 mL 0  . EPINEPHrine 0.3 mg/0.3 mL IJ SOAJ injection Inject 0.3 mLs (0.3 mg total) into the muscle once as needed (for allergic reaction). 1 Device prn  . esomeprazole (NEXIUM) 20 MG capsule Take 20 mg by mouth daily at 12 noon.    . ezetimibe-simvastatin (VYTORIN) 10-10 MG per tablet Take 1 tablet by mouth daily.     . fluticasone (FLONASE) 50 MCG/ACT nasal spray USE TWO SPRAY(S) IN EACH NOSTRIL ONCE DAILY 16 g prn  . gabapentin (NEURONTIN) 300 MG capsule Take 300 mg by mouth 2 (two) times daily.    Marland Kitchen ipratropium (ATROVENT) 0.02 % nebulizer solution Take 2.5 mLs (0.5 mg total) by nebulization every 6 (six) hours as needed for wheezing or shortness of breath. 150 mL 12  .  loratadine (CLARITIN) 10 MG tablet Take 1 tablet (10 mg total) by mouth daily. 30 tablet 11  . magnesium oxide (MAG-OX) 400 MG tablet Take 800 mg by mouth daily.     . metoprolol succinate (TOPROL-XL) 25 MG 24 hr tablet Take 25 mg by mouth daily.     . nitroGLYCERIN (NITROSTAT) 0.4 MG SL tablet Place 0.4 mg under the tongue every 5 (five) minutes as needed for chest pain.     Marland Kitchen oxyCODONE-acetaminophen (PERCOCET/ROXICET) 5-325 MG tablet Take 1 tablet by mouth every 6 (six) hours as needed for severe pain. 10 tablet 0  . sennosides-docusate sodium (SENOKOT-S) 8.6-50 MG tablet Take 2 tablets by mouth daily. (Patient taking differently: Take 2 tablets by mouth daily as needed. ) 30 tablet 1  . tadalafil (CIALIS) 5 MG tablet Take 5 mg by mouth every other day.     . Testosterone (ANDROGEL) 20.25 MG/1.25GM (1.62%) GEL Place 1-2 application onto the skin daily. Apply 1-2 squirts to each shoulder    . traZODone (DESYREL) 100 MG tablet Take 1 1/2 tablets at bedtime 45 tablet 5   No current facility-administered medications on file prior to visit.

## 2015-10-09 NOTE — Telephone Encounter (Signed)
Rx printed for Tussionex and given to wife to have filled at pharmacy. Rx sent into pharmacy for Zpak. Patient's wife aware. Nothing further needed.

## 2015-10-09 NOTE — Telephone Encounter (Signed)
Ok Tussionex 200 ml,  5 ml  Every 12 hours as needed        Z pak

## 2015-11-07 HISTORY — PX: OTHER SURGICAL HISTORY: SHX169

## 2015-11-08 ENCOUNTER — Other Ambulatory Visit: Payer: Self-pay | Admitting: Internal Medicine

## 2015-11-12 ENCOUNTER — Emergency Department: Payer: BLUE CROSS/BLUE SHIELD

## 2015-11-12 ENCOUNTER — Emergency Department
Admission: EM | Admit: 2015-11-12 | Discharge: 2015-11-12 | Disposition: A | Discharge status: Home / Self Care | Payer: BLUE CROSS/BLUE SHIELD | Attending: Emergency Medicine | Admitting: Emergency Medicine

## 2015-11-12 DIAGNOSIS — R102 Pelvic and perineal pain: Secondary | ICD-10-CM | POA: Diagnosis not present

## 2015-11-12 DIAGNOSIS — Z7982 Long term (current) use of aspirin: Secondary | ICD-10-CM | POA: Insufficient documentation

## 2015-11-12 DIAGNOSIS — E119 Type 2 diabetes mellitus without complications: Secondary | ICD-10-CM | POA: Diagnosis not present

## 2015-11-12 DIAGNOSIS — E669 Obesity, unspecified: Secondary | ICD-10-CM | POA: Diagnosis not present

## 2015-11-12 DIAGNOSIS — I252 Old myocardial infarction: Secondary | ICD-10-CM | POA: Insufficient documentation

## 2015-11-12 DIAGNOSIS — R100 Acute abdomen: Secondary | ICD-10-CM | POA: Diagnosis not present

## 2015-11-12 DIAGNOSIS — K5900 Constipation, unspecified: Secondary | ICD-10-CM | POA: Insufficient documentation

## 2015-11-12 DIAGNOSIS — K5732 Diverticulitis of large intestine without perforation or abscess without bleeding: Secondary | ICD-10-CM | POA: Diagnosis not present

## 2015-11-12 DIAGNOSIS — E7800 Pure hypercholesterolemia, unspecified: Secondary | ICD-10-CM | POA: Insufficient documentation

## 2015-11-12 DIAGNOSIS — R1030 Lower abdominal pain, unspecified: Secondary | ICD-10-CM | POA: Diagnosis not present

## 2015-11-12 DIAGNOSIS — Z87891 Personal history of nicotine dependence: Secondary | ICD-10-CM | POA: Insufficient documentation

## 2015-11-12 DIAGNOSIS — I2510 Atherosclerotic heart disease of native coronary artery without angina pectoris: Secondary | ICD-10-CM | POA: Diagnosis not present

## 2015-11-12 DIAGNOSIS — E785 Hyperlipidemia, unspecified: Secondary | ICD-10-CM | POA: Insufficient documentation

## 2015-11-12 DIAGNOSIS — Z7951 Long term (current) use of inhaled steroids: Secondary | ICD-10-CM | POA: Insufficient documentation

## 2015-11-12 DIAGNOSIS — N200 Calculus of kidney: Secondary | ICD-10-CM | POA: Diagnosis not present

## 2015-11-12 DIAGNOSIS — Z79899 Other long term (current) drug therapy: Secondary | ICD-10-CM | POA: Insufficient documentation

## 2015-11-12 DIAGNOSIS — R109 Unspecified abdominal pain: Secondary | ICD-10-CM | POA: Diagnosis not present

## 2015-11-12 DIAGNOSIS — R3916 Straining to void: Secondary | ICD-10-CM | POA: Diagnosis not present

## 2015-11-12 DIAGNOSIS — K219 Gastro-esophageal reflux disease without esophagitis: Secondary | ICD-10-CM | POA: Diagnosis not present

## 2015-11-12 DIAGNOSIS — I10 Essential (primary) hypertension: Secondary | ICD-10-CM | POA: Diagnosis not present

## 2015-11-12 DIAGNOSIS — Z951 Presence of aortocoronary bypass graft: Secondary | ICD-10-CM | POA: Diagnosis not present

## 2015-11-12 DIAGNOSIS — R3915 Urgency of urination: Secondary | ICD-10-CM | POA: Diagnosis not present

## 2015-11-12 HISTORY — DX: Type 2 diabetes mellitus without complications: E11.9

## 2015-11-12 LAB — COMPREHENSIVE METABOLIC PANEL
ALT: 11 U/L — ABNORMAL LOW (ref 17–63)
AST: 15 U/L (ref 15–41)
Albumin: 3.6 g/dL (ref 3.5–5.0)
Alkaline Phosphatase: 85 U/L (ref 38–126)
Anion gap: 8 (ref 5–15)
BUN: 37 mg/dL — ABNORMAL HIGH (ref 6–20)
CO2: 24 mmol/L (ref 22–32)
Calcium: 9.2 mg/dL (ref 8.9–10.3)
Chloride: 98 mmol/L — ABNORMAL LOW (ref 101–111)
Creatinine, Ser: 1.06 mg/dL (ref 0.61–1.24)
GFR calc Af Amer: >60 mL/min (ref >60)
GFR calc non Af Amer: >60 mL/min (ref >60)
Glucose, Bld: 159 mg/dL — ABNORMAL HIGH (ref 65–99)
Potassium: 3.9 mmol/L (ref 3.5–5.1)
Sodium: 130 mmol/L — ABNORMAL LOW (ref 135–145)
Total Bilirubin: 1 mg/dL (ref 0.3–1.2)
Total Protein: 8.1 g/dL (ref 6.5–8.1)

## 2015-11-12 LAB — CBC
HCT: 47.3 % (ref 40.0–52.0)
Hemoglobin: 16.4 g/dL (ref 13.0–18.0)
MCH: 29 pg (ref 26.0–34.0)
MCHC: 34.6 g/dL (ref 32.0–36.0)
MCV: 84 fL (ref 80.0–100.0)
Platelets: 277 10*3/uL (ref 150–440)
RBC: 5.63 MIL/uL (ref 4.40–5.90)
RDW: 13.3 % (ref 11.5–14.5)
WBC: 12.6 10*3/uL — ABNORMAL HIGH (ref 3.8–10.6)

## 2015-11-12 LAB — LIPASE, BLOOD: Lipase: 17 U/L (ref 11–51)

## 2015-11-12 MED ORDER — IOPAMIDOL (ISOVUE-300) INJECTION 61%
100.0000 mL | Freq: Once | INTRAVENOUS | Status: AC | PRN
  Administered 2015-11-12: 100 mL via INTRAVENOUS

## 2015-11-12 MED ORDER — DIATRIZOATE MEGLUMINE & SODIUM 66-10 % PO SOLN
15.0000 mL | Freq: Once | ORAL | Status: AC
  Administered 2015-11-12: 15 mL via ORAL

## 2015-11-12 MED ORDER — SODIUM CHLORIDE 0.9 % IV BOLUS (SEPSIS)
1000.0000 mL | Freq: Once | INTRAVENOUS | Status: AC
  Administered 2015-11-12: 1000 mL via INTRAVENOUS

## 2015-11-12 MED ORDER — CIPROFLOXACIN HCL 500 MG PO TABS: 500.0000 mg | ORAL_TABLET | Freq: Two times a day (BID) | ORAL | Status: DC

## 2015-11-12 MED ORDER — MORPHINE SULFATE (PF) 4 MG/ML IV SOLN
4.0000 mg | Freq: Once | INTRAVENOUS | Status: AC
  Administered 2015-11-12: 4 mg via INTRAVENOUS
  Filled 2015-11-12: qty 1

## 2015-11-12 MED ORDER — ONDANSETRON HCL 4 MG/2ML IJ SOLN
4.0000 mg | Freq: Once | INTRAMUSCULAR | Status: AC
  Administered 2015-11-12: 4 mg via INTRAVENOUS
  Filled 2015-11-12: qty 2

## 2015-11-12 MED ORDER — METRONIDAZOLE 500 MG PO TABS
500.0000 mg | ORAL_TABLET | Freq: Three times a day (TID) | ORAL | Status: DC
Start: 2015-11-12 — End: 2016-01-24

## 2015-11-12 MED ORDER — ONDANSETRON 4 MG PO TBDP: 4.0000 mg | ORAL_TABLET | Freq: Four times a day (QID) | ORAL | Status: DC | PRN

## 2015-11-12 MED ORDER — IOPAMIDOL (ISOVUE-370) INJECTION 76%: 100.0000 mL | Freq: Once | INTRAVENOUS | Status: DC | PRN

## 2015-11-12 MED ORDER — METRONIDAZOLE 500 MG PO TABS
500.0000 mg | ORAL_TABLET | Freq: Once | ORAL | Status: AC
  Administered 2015-11-12: 500 mg via ORAL
  Filled 2015-11-12: qty 1

## 2015-11-12 MED ORDER — CIPROFLOXACIN HCL 500 MG PO TABS
500.0000 mg | ORAL_TABLET | Freq: Once | ORAL | Status: AC
  Administered 2015-11-12: 500 mg via ORAL
  Filled 2015-11-12: qty 1
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
## 2015-11-12 NOTE — Discharge Instructions (Signed)
We believe your symptoms are caused by diverticulitis.  Most of the time this condition (please read through the included information) can be cured with outpatient antibiotics.  Please take the full course of prescribed medication(s) and follow up with the doctors recommended above. ° °Return to the ED if your abdominal pain worsens or fails to improve, you develop bloody vomiting, bloody diarrhea, you are unable to tolerate fluids due to vomiting, fever greater than 101, or other symptoms that concern you. ° ° ° ° °Diverticulitis °Diverticulitis is inflammation or infection of small pouches in your colon that form when you have a condition called diverticulosis. The pouches in your colon are called diverticula. Your colon, or large intestine, is where water is absorbed and stool is formed. °Complications of diverticulitis can include: °· Bleeding. °· Severe infection. °· Severe pain. °· Perforation of your colon. °· Obstruction of your colon. °CAUSES  °Diverticulitis is caused by bacteria. °Diverticulitis happens when stool becomes trapped in diverticula. This allows bacteria to grow in the diverticula, which can lead to inflammation and infection. °RISK FACTORS °People with diverticulosis are at risk for diverticulitis. Eating a diet that does not include enough fiber from fruits and vegetables Mui make diverticulitis more likely to develop. °SYMPTOMS  °Symptoms of diverticulitis Kiss include: °· Abdominal pain and tenderness. The pain is normally located on the left side of the abdomen, but Mcchesney occur in other areas. °· Fever and chills. °· Bloating. °· Cramping. °· Nausea. °· Vomiting. °· Constipation. °· Diarrhea. °· Blood in your stool. °DIAGNOSIS  °Your health care provider will ask you about your medical history and do a physical exam. You Stene need to have tests done because many medical conditions can cause the same symptoms as diverticulitis. Tests Ratti include: °· Blood tests. °· Urine tests. °· Imaging  tests of the abdomen, including X-rays and CT scans. °When your condition is under control, your health care provider Mayo recommend that you have a colonoscopy. A colonoscopy can show how severe your diverticula are and whether something else is causing your symptoms. °TREATMENT  °Most cases of diverticulitis are mild and can be treated at home. Treatment Nesser include: °· Taking over-the-counter pain medicines. °· Following a clear liquid diet. °· Taking antibiotic medicines by mouth for 7-10 days. °More severe cases Lona be treated at a hospital. Treatment Frances include: °· Not eating or drinking. °· Taking prescription pain medicine. °· Receiving antibiotic medicines through an IV tube. °· Receiving fluids and nutrition through an IV tube. °· Surgery. °HOME CARE INSTRUCTIONS  °· Follow your health care provider's instructions carefully. °· Follow a full liquid diet or other diet as directed by your health care provider. After your symptoms improve, your health care provider Bloodsaw tell you to change your diet. He or she Bellanger recommend you eat a high-fiber diet. Fruits and vegetables are good sources of fiber. Fiber makes it easier to pass stool. °· Take fiber supplements or probiotics as directed by your health care provider. °· Only take medicines as directed by your health care provider. °· Keep all your follow-up appointments. °SEEK MEDICAL CARE IF:  °· Your pain does not improve. °· You have a hard time eating food. °· Your bowel movements do not return to normal. °SEEK IMMEDIATE MEDICAL CARE IF:  °· Your pain becomes worse. °· Your symptoms do not get better. °· Your symptoms suddenly get worse. °· You have a fever. °· You have repeated vomiting. °· You have bloody or black, tarry   stools. °MAKE SURE YOU:  °· Understand these instructions. °· Will watch your condition. °· Will get help right away if you are not doing well or get worse. °  °This information is not intended to replace advice given to you by your health  care provider. Make sure you discuss any questions you have with your health care provider. °  °Document Released: 05/04/2005 Document Revised: 07/30/2013 Document Reviewed: 06/19/2013 °Elsevier Interactive Patient Education ©2016 [employer] ° °

## 2015-11-12 NOTE — ED Notes (Signed)
Pt states he had an xr at pcp today and they sent him in as he is full of stool. Has taken stool softener and ducolax and 1/2 bottle of mag citrate

## 2015-11-12 NOTE — ED Notes (Signed)
Pt states he had back surgery last Thursday, states he has not had a BM since last Friday after taking a stool softener. Denies N/V.Marland KitchenMarland Kitchen

## 2015-12-09 DIAGNOSIS — E0865 Diabetes mellitus due to underlying condition with hyperglycemia: Secondary | ICD-10-CM | POA: Diagnosis not present

## 2015-12-09 DIAGNOSIS — K5732 Diverticulitis of large intestine without perforation or abscess without bleeding: Secondary | ICD-10-CM | POA: Diagnosis not present

## 2015-12-09 DIAGNOSIS — E119 Type 2 diabetes mellitus without complications: Secondary | ICD-10-CM | POA: Diagnosis not present

## 2015-12-09 DIAGNOSIS — R109 Unspecified abdominal pain: Secondary | ICD-10-CM | POA: Diagnosis not present

## 2015-12-09 DIAGNOSIS — M545 Low back pain: Secondary | ICD-10-CM | POA: Diagnosis not present

## 2015-12-09 DIAGNOSIS — E785 Hyperlipidemia, unspecified: Secondary | ICD-10-CM | POA: Diagnosis not present

## 2015-12-09 DIAGNOSIS — I10 Essential (primary) hypertension: Secondary | ICD-10-CM | POA: Diagnosis not present

## 2015-12-09 DIAGNOSIS — N200 Calculus of kidney: Secondary | ICD-10-CM | POA: Diagnosis not present

## 2015-12-10 ENCOUNTER — Emergency Department: Payer: BLUE CROSS/BLUE SHIELD

## 2015-12-10 ENCOUNTER — Emergency Department
Admission: EM | Admit: 2015-12-10 | Discharge: 2015-12-10 | Disposition: A | Discharge status: Home / Self Care | Payer: BLUE CROSS/BLUE SHIELD | Attending: Emergency Medicine | Admitting: Emergency Medicine

## 2015-12-10 DIAGNOSIS — Z87891 Personal history of nicotine dependence: Secondary | ICD-10-CM | POA: Diagnosis not present

## 2015-12-10 DIAGNOSIS — W5589XA Other contact with other mammals, initial encounter: Secondary | ICD-10-CM | POA: Insufficient documentation

## 2015-12-10 DIAGNOSIS — I252 Old myocardial infarction: Secondary | ICD-10-CM | POA: Diagnosis not present

## 2015-12-10 DIAGNOSIS — Z6839 Body mass index (BMI) 39.0-39.9, adult: Secondary | ICD-10-CM | POA: Insufficient documentation

## 2015-12-10 DIAGNOSIS — Y929 Unspecified place or not applicable: Secondary | ICD-10-CM | POA: Insufficient documentation

## 2015-12-10 DIAGNOSIS — E119 Type 2 diabetes mellitus without complications: Secondary | ICD-10-CM | POA: Insufficient documentation

## 2015-12-10 DIAGNOSIS — Y939 Activity, unspecified: Secondary | ICD-10-CM | POA: Insufficient documentation

## 2015-12-10 DIAGNOSIS — M1990 Unspecified osteoarthritis, unspecified site: Secondary | ICD-10-CM | POA: Insufficient documentation

## 2015-12-10 DIAGNOSIS — S46212A Strain of muscle, fascia and tendon of other parts of biceps, left arm, initial encounter: Secondary | ICD-10-CM

## 2015-12-10 DIAGNOSIS — E669 Obesity, unspecified: Secondary | ICD-10-CM | POA: Diagnosis not present

## 2015-12-10 DIAGNOSIS — Z792 Long term (current) use of antibiotics: Secondary | ICD-10-CM | POA: Diagnosis not present

## 2015-12-10 DIAGNOSIS — S46222A Laceration of muscle, fascia and tendon of other parts of biceps, left arm, initial encounter: Secondary | ICD-10-CM | POA: Insufficient documentation

## 2015-12-10 DIAGNOSIS — I2510 Atherosclerotic heart disease of native coronary artery without angina pectoris: Secondary | ICD-10-CM | POA: Diagnosis not present

## 2015-12-10 DIAGNOSIS — E785 Hyperlipidemia, unspecified: Secondary | ICD-10-CM | POA: Insufficient documentation

## 2015-12-10 DIAGNOSIS — I10 Essential (primary) hypertension: Secondary | ICD-10-CM | POA: Insufficient documentation

## 2015-12-10 DIAGNOSIS — M79602 Pain in left arm: Secondary | ICD-10-CM | POA: Diagnosis present

## 2015-12-10 DIAGNOSIS — Z7982 Long term (current) use of aspirin: Secondary | ICD-10-CM | POA: Insufficient documentation

## 2015-12-10 DIAGNOSIS — M79622 Pain in left upper arm: Secondary | ICD-10-CM | POA: Diagnosis not present

## 2015-12-10 DIAGNOSIS — J45909 Unspecified asthma, uncomplicated: Secondary | ICD-10-CM | POA: Insufficient documentation

## 2015-12-10 DIAGNOSIS — Y999 Unspecified external cause status: Secondary | ICD-10-CM | POA: Diagnosis not present

## 2015-12-10 DIAGNOSIS — Z79899 Other long term (current) drug therapy: Secondary | ICD-10-CM | POA: Insufficient documentation

## 2015-12-10 DIAGNOSIS — S4992XA Unspecified injury of left shoulder and upper arm, initial encounter: Secondary | ICD-10-CM | POA: Diagnosis not present

## 2015-12-10 MED ORDER — OXYCODONE-ACETAMINOPHEN 5-325 MG PO TABS
2.0000 | ORAL_TABLET | ORAL | Status: AC
  Administered 2015-12-10: 2 via ORAL

## 2015-12-10 MED ORDER — OXYCODONE-ACETAMINOPHEN 5-325 MG PO TABS: 1.0000 | ORAL_TABLET | Freq: Four times a day (QID) | ORAL | Status: DC | PRN

## 2015-12-10 MED ORDER — OXYCODONE-ACETAMINOPHEN 5-325 MG PO TABS
ORAL_TABLET | ORAL | Status: AC
  Administered 2015-12-10: 2 via ORAL
  Filled 2015-12-10: qty 2
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
## 2015-12-10 NOTE — ED Notes (Signed)
NAD noted at time of D/C. Pt denies questions or concerns. Pt ambulatory to the lobby at this time.  

## 2015-12-10 NOTE — Discharge Instructions (Signed)
Follow-up with your doctor (Orthopedic) or Dr. Ernest Pine by early next week.  Return to the ER if your pain is uncontrolled, you have developed a cold or blue left hand, weakness, fevers, or other new concerns arise.  Biceps Tendon Disruption (Distal) With Rehab The biceps tendon attaches the biceps muscle to the bones of the elbow and the shoulder. A distal biceps tendon disruption is a tear of this tendon at the end the attached near the elbow. A distal biceps tendon rupture is an uncommon injury. These injuries usually involve a complete tear of the tendon from the bone; however, partial tears are also possible. The bicep muscle works with other muscles to bend the elbow and rotate the palm upward (supinate). A complete biceps rupture will result in approximately a 30% decrease in elbow bending strength and a 40% decrease in one's ability to supinate the wrist. SYMPTOMS   Pain, tenderness, swelling, warmth, or redness at the elbow, usually in the front of the elbow.  Pain that worsens with flexion of the elbow against resistance and when straightening the elbow.  Bulge can be seen and felt in the arm.  Bruising (contusion) in the elbow or forearm after 24 hours.  Limited motion of the elbow.  Weakness with attempted elbow bending (lifting or carrying) or rotation of the wrist (like when using a screwdriver).  A crackling sound (crepitation) when the tendon or elbow is moved or touched. CAUSES  A biceps tendon rupture occurs when the tendon is subjected to a force that is greater than it can withstand, such as straightening the elbow while the biceps is contracted or direct trauma (rare). RISK INCREASES WITH:   Sports that involve contact, as well as throwing sports, gymnastics, weightlifting, and bodybuilding.  Heavy labor.  Poor strength and flexibility.  Failure to warm-up properly before activity. PREVENTION  Warm up and stretch appropriately before activity.  Maintain physical  fitness:  Strength, flexibility, and endurance.  Cardiovascular fitness  Allow your body to recover between practices and competition.  Learn and use proper technique. PROGNOSIS  Surgery is usually required to fix distal biceps tendon rupture. After surgery, a recovery period of 4 to 8 months can be expected to allow for healing and a return to sports.  RELATED COMPLICATIONS  Weakness of elbow bending and forearm rotation, especially if treated non-surgically.  Prolonged disability.  Re-rupture of the tendon after surgery.  Risks of surgery, including infection, bleeding, injury to nerves, elbow or wrist stiffness or loss of motion, and weakness of elbow bending or wrist rotation. TREATMENT  Treatment initially consists of ice and medication to help reduce pain and inflammation. A sling [place] also be worn to increase one's comfort. Surgery is required for a full recovery and return to sports. Surgery involves reattaching the tendon to the bone. Weakness can be expected if surgery is not performed; however, this Bon[place] be acceptable for sedentary individuals. Surgery is usually followed by immobilization and rehabilitation exercises to regain strength and a full range of motion.  MEDICATION  If pain medication is necessary, nonsteroidal anti-inflammatory medications, such as aspirin and ibuprofen, or other minor pain relievers, such as acetaminophen, are often recommended.  Do not take pain medication for 7 days before surgery.  Prescription pain relievers Bohle be given if deemed necessary by your caregiver. Use only as directed and only as much as you need. HEAT AND COLD  Cold treatment (icing) relieves pain and reduces inflammation. Cold treatment should be applied for 10 to 15  minutes every 2 to 3 hours for inflammation and pain and immediately after any activity that aggravates your symptoms. Use ice packs or an ice massage.  Heat treatment Liew be used prior to performing the stretching  and strengthening activities prescribed by your caregiver, physical therapist, or athletic trainer. Use a heat pack or a warm soak. SEEK MEDICAL CARE IF:   Symptoms get worse or do not improve in 2 weeks despite treatment.  You experience pain, numbness, or coldness in the hand.  Blue, gray, or dark color appears in the fingernails.  Any of the following occur after surgery:  Increased pain, swelling, redness, drainage, or bleeding in the surgical area.  Signs of infection (headache, muscle aches, dizziness, or a general ill feeling with fever).  New, unexplained symptoms develop (drugs used in treatment [place] produce side effects).   Document Released: 07/25/2005 Document Revised: 08/15/2014 Document Reviewed: 11/06/2008 Elsevier Interactive Patient Education [employer]

## 2015-12-14 ENCOUNTER — Other Ambulatory Visit: Payer: Self-pay | Admitting: Orthopedic Surgery

## 2015-12-14 DIAGNOSIS — M25522 Pain in left elbow: Secondary | ICD-10-CM

## 2015-12-14 DIAGNOSIS — M5416 Radiculopathy, lumbar region: Secondary | ICD-10-CM | POA: Diagnosis not present

## 2015-12-18 DIAGNOSIS — Z23 Encounter for immunization: Secondary | ICD-10-CM | POA: Diagnosis not present

## 2015-12-20 ENCOUNTER — Ambulatory Visit
Admission: RE | Admit: 2015-12-20 | Discharge: 2015-12-20 | Disposition: A | Discharge status: Home / Self Care | Payer: BLUE CROSS/BLUE SHIELD | Source: Ambulatory Visit | Attending: Orthopedic Surgery | Admitting: Orthopedic Surgery

## 2015-12-20 DIAGNOSIS — M25522 Pain in left elbow: Secondary | ICD-10-CM

## 2015-12-20 DIAGNOSIS — S46112A Strain of muscle, fascia and tendon of long head of biceps, left arm, initial encounter: Secondary | ICD-10-CM | POA: Diagnosis not present

## 2015-12-23 DIAGNOSIS — S46212A Strain of muscle, fascia and tendon of other parts of biceps, left arm, initial encounter: Secondary | ICD-10-CM | POA: Diagnosis not present

## 2015-12-31 DIAGNOSIS — Y999 Unspecified external cause status: Secondary | ICD-10-CM | POA: Diagnosis not present

## 2015-12-31 DIAGNOSIS — G8918 Other acute postprocedural pain: Secondary | ICD-10-CM | POA: Diagnosis not present

## 2015-12-31 DIAGNOSIS — M66822 Spontaneous rupture of other tendons, left upper arm: Secondary | ICD-10-CM | POA: Diagnosis not present

## 2015-12-31 DIAGNOSIS — S46292A Other injury of muscle, fascia and tendon of other parts of biceps, left arm, initial encounter: Secondary | ICD-10-CM | POA: Diagnosis not present

## 2016-01-08 ENCOUNTER — Ambulatory Visit: Payer: 59 | Admitting: Internal Medicine

## 2016-01-08 DIAGNOSIS — M66822 Spontaneous rupture of other tendons, left upper arm: Secondary | ICD-10-CM | POA: Diagnosis not present

## 2016-01-23 ENCOUNTER — Other Ambulatory Visit: Payer: Self-pay | Admitting: Internal Medicine

## 2016-01-24 ENCOUNTER — Emergency Department: Payer: BLUE CROSS/BLUE SHIELD

## 2016-01-24 ENCOUNTER — Encounter: Payer: Self-pay | Admitting: Radiology

## 2016-01-24 ENCOUNTER — Emergency Department
Admission: EM | Admit: 2016-01-24 | Discharge: 2016-01-25 | Disposition: A | Discharge status: Home / Self Care | Payer: BLUE CROSS/BLUE SHIELD | Attending: Emergency Medicine | Admitting: Emergency Medicine

## 2016-01-24 DIAGNOSIS — K5732 Diverticulitis of large intestine without perforation or abscess without bleeding: Secondary | ICD-10-CM

## 2016-01-24 DIAGNOSIS — Z792 Long term (current) use of antibiotics: Secondary | ICD-10-CM | POA: Insufficient documentation

## 2016-01-24 DIAGNOSIS — I10 Essential (primary) hypertension: Secondary | ICD-10-CM | POA: Insufficient documentation

## 2016-01-24 DIAGNOSIS — Z87891 Personal history of nicotine dependence: Secondary | ICD-10-CM | POA: Insufficient documentation

## 2016-01-24 DIAGNOSIS — Z7982 Long term (current) use of aspirin: Secondary | ICD-10-CM | POA: Insufficient documentation

## 2016-01-24 DIAGNOSIS — J45909 Unspecified asthma, uncomplicated: Secondary | ICD-10-CM | POA: Insufficient documentation

## 2016-01-24 DIAGNOSIS — M1990 Unspecified osteoarthritis, unspecified site: Secondary | ICD-10-CM | POA: Diagnosis not present

## 2016-01-24 DIAGNOSIS — N50811 Right testicular pain: Secondary | ICD-10-CM | POA: Insufficient documentation

## 2016-01-24 DIAGNOSIS — M5136 Other intervertebral disc degeneration, lumbar region: Secondary | ICD-10-CM | POA: Diagnosis not present

## 2016-01-24 DIAGNOSIS — I2510 Atherosclerotic heart disease of native coronary artery without angina pectoris: Secondary | ICD-10-CM | POA: Insufficient documentation

## 2016-01-24 DIAGNOSIS — E119 Type 2 diabetes mellitus without complications: Secondary | ICD-10-CM | POA: Diagnosis not present

## 2016-01-24 DIAGNOSIS — Z955 Presence of coronary angioplasty implant and graft: Secondary | ICD-10-CM | POA: Diagnosis not present

## 2016-01-24 DIAGNOSIS — R1030 Lower abdominal pain, unspecified: Secondary | ICD-10-CM | POA: Diagnosis present

## 2016-01-24 DIAGNOSIS — Z79899 Other long term (current) drug therapy: Secondary | ICD-10-CM | POA: Insufficient documentation

## 2016-01-24 DIAGNOSIS — I252 Old myocardial infarction: Secondary | ICD-10-CM | POA: Insufficient documentation

## 2016-01-24 DIAGNOSIS — N50812 Left testicular pain: Secondary | ICD-10-CM | POA: Diagnosis not present

## 2016-01-24 DIAGNOSIS — N50819 Testicular pain, unspecified: Secondary | ICD-10-CM

## 2016-01-24 DIAGNOSIS — E785 Hyperlipidemia, unspecified: Secondary | ICD-10-CM | POA: Diagnosis not present

## 2016-01-24 DIAGNOSIS — N433 Hydrocele, unspecified: Secondary | ICD-10-CM | POA: Diagnosis not present

## 2016-01-24 LAB — URINALYSIS COMPLETE WITH MICROSCOPIC (ARMC ONLY)
Bacteria, UA: NONE SEEN
Bilirubin Urine: NEGATIVE
Glucose, UA: NEGATIVE mg/dL
Hgb urine dipstick: NEGATIVE
Ketones, ur: NEGATIVE mg/dL
Nitrite: NEGATIVE
Protein, ur: NEGATIVE mg/dL
Specific Gravity, Urine: 1.027 (ref 1.005–1.030)
Squamous Epithelial / LPF: NONE SEEN
pH: 5 (ref 5.0–8.0)

## 2016-01-24 LAB — CBC
HCT: 40.5 % (ref 40.0–52.0)
Hemoglobin: 13.8 g/dL (ref 13.0–18.0)
MCH: 28.4 pg (ref 26.0–34.0)
MCHC: 34 g/dL (ref 32.0–36.0)
MCV: 83.5 fL (ref 80.0–100.0)
Platelets: 183 10*3/uL (ref 150–440)
RBC: 4.84 MIL/uL (ref 4.40–5.90)
RDW: 13.9 % (ref 11.5–14.5)
WBC: 11.9 10*3/uL — ABNORMAL HIGH (ref 3.8–10.6)

## 2016-01-24 LAB — COMPREHENSIVE METABOLIC PANEL
ALT: 16 U/L — ABNORMAL LOW (ref 17–63)
AST: 20 U/L (ref 15–41)
Albumin: 3.6 g/dL (ref 3.5–5.0)
Alkaline Phosphatase: 66 U/L (ref 38–126)
Anion gap: 8 (ref 5–15)
BUN: 17 mg/dL (ref 6–20)
CO2: 24 mmol/L (ref 22–32)
Calcium: 8.8 mg/dL — ABNORMAL LOW (ref 8.9–10.3)
Chloride: 104 mmol/L (ref 101–111)
Creatinine, Ser: 0.88 mg/dL (ref 0.61–1.24)
GFR calc Af Amer: >60 mL/min (ref >60)
GFR calc non Af Amer: >60 mL/min (ref >60)
Glucose, Bld: 113 mg/dL — ABNORMAL HIGH (ref 65–99)
Potassium: 4 mmol/L (ref 3.5–5.1)
Sodium: 136 mmol/L (ref 135–145)
Total Bilirubin: 0.1 mg/dL — ABNORMAL LOW (ref 0.3–1.2)
Total Protein: 6.7 g/dL (ref 6.5–8.1)

## 2016-01-24 MED ORDER — METRONIDAZOLE 500 MG PO TABS: 500.0000 mg | ORAL_TABLET | Freq: Three times a day (TID) | ORAL | Status: AC

## 2016-01-24 MED ORDER — METRONIDAZOLE 500 MG PO TABS
500.0000 mg | ORAL_TABLET | Freq: Once | ORAL | Status: AC
  Administered 2016-01-24: 500 mg via ORAL
  Filled 2016-01-24: qty 1

## 2016-01-24 MED ORDER — DOCUSATE SODIUM 100 MG PO CAPS: 100.0000 mg | ORAL_CAPSULE | Freq: Two times a day (BID) | ORAL | Status: AC

## 2016-01-24 MED ORDER — ONDANSETRON HCL 4 MG/2ML IJ SOLN
4.0000 mg | Freq: Once | INTRAMUSCULAR | Status: AC
  Administered 2016-01-24: 4 mg via INTRAVENOUS
  Filled 2016-01-24: qty 2

## 2016-01-24 MED ORDER — OXYCODONE-ACETAMINOPHEN 5-325 MG PO TABS: 1.0000 | ORAL_TABLET | Freq: Four times a day (QID) | ORAL | Status: DC | PRN

## 2016-01-24 MED ORDER — CIPROFLOXACIN HCL 500 MG PO TABS
500.0000 mg | ORAL_TABLET | Freq: Once | ORAL | Status: AC
  Administered 2016-01-24: 500 mg via ORAL
  Filled 2016-01-24: qty 1

## 2016-01-24 MED ORDER — OXYCODONE-ACETAMINOPHEN 5-325 MG PO TABS
1.0000 | ORAL_TABLET | ORAL | Status: DC | PRN
  Administered 2016-01-24: 1 via ORAL

## 2016-01-24 MED ORDER — OXYCODONE-ACETAMINOPHEN 5-325 MG PO TABS
2.0000 | ORAL_TABLET | Freq: Once | ORAL | Status: AC
  Administered 2016-01-24: 2 via ORAL
  Filled 2016-01-24: qty 2

## 2016-01-24 MED ORDER — CIPROFLOXACIN HCL 500 MG PO TABS
500.0000 mg | ORAL_TABLET | Freq: Two times a day (BID) | ORAL | Status: AC
Start: 2016-01-24 — End: 2016-01-27

## 2016-01-24 MED ORDER — IOPAMIDOL (ISOVUE-300) INJECTION 61%
100.0000 mL | Freq: Once | INTRAVENOUS | Status: AC | PRN
  Administered 2016-01-24: 100 mL via INTRAVENOUS

## 2016-01-24 MED ORDER — DIATRIZOATE MEGLUMINE & SODIUM 66-10 % PO SOLN
15.0000 mL | Freq: Once | ORAL | Status: AC
  Administered 2016-01-24: 15 mL via ORAL

## 2016-01-24 MED ORDER — OXYCODONE-ACETAMINOPHEN 5-325 MG PO TABS
ORAL_TABLET | ORAL | Status: AC
  Administered 2016-01-24: 1 via ORAL
  Filled 2016-01-24: qty 1

## 2016-01-24 MED ORDER — SODIUM CHLORIDE 0.9 % IV BOLUS (SEPSIS)
1000.0000 mL | Freq: Once | INTRAVENOUS | Status: AC
  Administered 2016-01-24: 1000 mL via INTRAVENOUS

## 2016-01-24 MED ORDER — MORPHINE SULFATE (PF) 4 MG/ML IV SOLN
4.0000 mg | Freq: Once | INTRAVENOUS | Status: AC
  Administered 2016-01-24: 4 mg via INTRAVENOUS
  Filled 2016-01-24: qty 1

## 2016-01-24 NOTE — Discharge Instructions (Signed)
Diverticulitis  Diverticulitis is when small pockets that have formed in your colon (large intestine) become infected or swollen.  HOME CARE  · Follow your doctor's instructions.  · Follow a special diet if told by your doctor.  · When you feel better, your doctor Sanks tell you to change your diet. You Melberg be told to eat a lot of fiber. Fruits and vegetables are good sources of fiber. Fiber makes it easier to poop (have bowel movements).  · Take supplements or probiotics as told by your doctor.  · Only take medicines as told by your doctor.  · Keep all follow-up visits with your doctor.  GET HELP IF:  · Your pain does not get better.  · You have a hard time eating food.  · You are not pooping like normal.  GET HELP RIGHT AWAY IF:  · Your pain gets worse.  · Your problems do not get better.  · Your problems suddenly get worse.  · You have a fever.  · You keep throwing up (vomiting).  · You have bloody or black, tarry poop (stool).  MAKE SURE YOU:   · Understand these instructions.  · Will watch your condition.  · Will get help right away if you are not doing well or get worse.     This information is not intended to replace advice given to you by your health care provider. Make sure you discuss any questions you have with your health care provider.     Document Released: 01/11/2008 Document Revised: 07/30/2013 Document Reviewed: 06/19/2013  Elsevier Interactive Patient Education ©2016 [employer]

## 2016-01-24 NOTE — ED Notes (Addendum)
Pt reports pain in bilateral testicles, pain started 2-3 days ago. Pt denies any discharge from penis, reports difficulty urinating.
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
## 2016-01-24 NOTE — ED Notes (Signed)
To CT Scan via stretcher. AAOx3/  Skin warm and dry.  NAD 

## 2016-01-24 NOTE — ED Notes (Signed)
Pt ambulatory with steady gait to treatment room 16; pt c/o testicular pain for 3-4 days; difficulty voiding; does not feel like he's always emptying his bladder; pt adds he hasn't had a bowel movement in about a week;

## 2016-01-24 NOTE — ED Notes (Signed)
Patient to [place] via stretcher.  AAOx3.  Skin warm and dry.  Continue to monitor.

## 2016-01-28 DIAGNOSIS — R109 Unspecified abdominal pain: Secondary | ICD-10-CM | POA: Diagnosis not present

## 2016-01-28 DIAGNOSIS — E119 Type 2 diabetes mellitus without complications: Secondary | ICD-10-CM | POA: Diagnosis not present

## 2016-01-28 DIAGNOSIS — K5732 Diverticulitis of large intestine without perforation or abscess without bleeding: Secondary | ICD-10-CM | POA: Diagnosis not present

## 2016-01-28 DIAGNOSIS — I10 Essential (primary) hypertension: Secondary | ICD-10-CM | POA: Diagnosis not present

## 2016-02-04 ENCOUNTER — Other Ambulatory Visit: Payer: Self-pay

## 2016-02-07 ENCOUNTER — Other Ambulatory Visit: Payer: Self-pay | Admitting: Internal Medicine

## 2016-02-11 ENCOUNTER — Ambulatory Visit (INDEPENDENT_AMBULATORY_CARE_PROVIDER_SITE_OTHER): Payer: BLUE CROSS/BLUE SHIELD | Admitting: Surgery

## 2016-02-11 ENCOUNTER — Encounter: Payer: Self-pay | Admitting: Surgery

## 2016-02-11 ENCOUNTER — Telehealth: Payer: Self-pay

## 2016-02-11 VITALS — BP 129/71 | HR 72 | Temp 97.1°F | Ht 68.0 in | Wt 197.0 lb

## 2016-02-11 DIAGNOSIS — K5732 Diverticulitis of large intestine without perforation or abscess without bleeding: Secondary | ICD-10-CM | POA: Diagnosis not present

## 2016-02-11 NOTE — Telephone Encounter (Signed)
Please make patient referrals to Urology (Dr. Evelene Croon) and Pain Management.   Urology Consult is for Midline abdominal pain radiating to testicles and difficulty with urination. Patient has requested Dr. Evelene Croon.  Pain Management consult is for Chronic Abdominal and Back Pain.
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
## 2016-02-11 NOTE — Patient Instructions (Addendum)
We will place a referral to the pain clinic and to Dr. Evelene Croon for your Chronic Pain in your Abdomen and Testicles as well as for your back. We will call you as soon as these appointments have been made.  You have not been given any pain medication today as we do not give pain medication for Chronic Pain problems. This is why you will be referred to a pain managament doctor.  We would like to see you back in 3 weeks but as you have stated, you do not wish to come back. Please call if you change your mind.

## 2016-02-12 ENCOUNTER — Other Ambulatory Visit: Payer: Self-pay | Admitting: Orthopedic Surgery

## 2016-02-12 DIAGNOSIS — M66822 Spontaneous rupture of other tendons, left upper arm: Secondary | ICD-10-CM | POA: Diagnosis not present

## 2016-02-12 DIAGNOSIS — M25522 Pain in left elbow: Secondary | ICD-10-CM

## 2016-02-15 ENCOUNTER — Ambulatory Visit (INDEPENDENT_AMBULATORY_CARE_PROVIDER_SITE_OTHER): Payer: BLUE CROSS/BLUE SHIELD | Admitting: Internal Medicine

## 2016-02-15 ENCOUNTER — Encounter: Payer: Self-pay | Admitting: Internal Medicine

## 2016-02-15 VITALS — BP 120/68 | HR 65 | Ht 68.0 in | Wt 198.0 lb

## 2016-02-15 DIAGNOSIS — J4520 Mild intermittent asthma, uncomplicated: Secondary | ICD-10-CM | POA: Diagnosis not present

## 2016-02-15 MED ORDER — LORATADINE 10 MG PO TABS: 10.0000 mg | ORAL_TABLET | Freq: Every day | ORAL | Status: DC

## 2016-02-15 MED ORDER — ALBUTEROL SULFATE HFA 108 (90 BASE) MCG/ACT IN AERS: 2.0000 | INHALATION_SPRAY | Freq: Four times a day (QID) | RESPIRATORY_TRACT | Status: DC | PRN

## 2016-02-15 MED ORDER — ESOMEPRAZOLE MAGNESIUM 40 MG PO CPDR: 40.0000 mg | DELAYED_RELEASE_CAPSULE | Freq: Every day | ORAL | Status: DC

## 2016-02-15 MED ORDER — HYDROCOD POLST-CPM POLST ER 10-8 MG/5ML PO SUER: 5.0000 mL | Freq: Two times a day (BID) | ORAL | Status: DC | PRN

## 2016-02-15 MED ORDER — ALBUTEROL SULFATE (2.5 MG/3ML) 0.083% IN NEBU: INHALATION_SOLUTION | RESPIRATORY_TRACT | Status: DC

## 2016-02-15 MED ORDER — AZITHROMYCIN 250 MG PO TABS: ORAL_TABLET | ORAL | Status: DC

## 2016-02-15 MED ORDER — TRAZODONE HCL 100 MG PO TABS: ORAL_TABLET | ORAL | Status: DC

## 2016-02-15 MED ORDER — IPRATROPIUM BROMIDE 0.02 % IN SOLN: RESPIRATORY_TRACT | Status: DC

## 2016-02-15 MED ORDER — FLUTICASONE PROPIONATE 50 MCG/ACT NA SUSP: NASAL | Status: DC

## 2016-02-15 NOTE — Telephone Encounter (Signed)
I have made an appointment with Dr Evelene Croon with [facility] for the patient.   Appt date/time:  02/18/16 @ 8:30am with Dr Evelene Croon.   I have called patient to advise him of this appointment. No answer. I have left a message on voicemail for patient to call back to obtained appointment date and time. Patient will need to arrive at appointment 15 minutes early and will need to bring his medication list to appointment.   I have also sent a referral to [facility] through [facility]. I will follow up on this appointment to make sure this has been made.

## 2016-02-15 NOTE — Progress Notes (Signed)
05/29/11- 60 year old male former smoker followed for asthma, allergic rhinitis complicated by CAD, GERD.... wife here Last here- 10/13/2010 He had done well through the summer the last 2 or 3 days notes increasing sinus and chest congestion cough and thick white or trace yellow sputum. He denies headache, fever, sore throat. Coughing is making bilateral rib cage sore.  01/04/12- 60 year old male former smoker followed for asthma, allergic rhinitis complicated by CAD, GERD.... wife here Patient states about the same as last visit.  Pretty good spring with no acute issues until today. He woke this morning with some frontal headache, worse leaning over, little nasal discharge. Denies sore throat or sneeze. Ears okay. No cough or wheeze.  04/16/12- 60 year old male former smoker followed for asthma, allergic rhinitis complicated by CAD, GERD.... wife here ACUTE VISIT: cough since 03-06-12;dry hacky cough-has had 2 rounds of Zithromax, 1 prednisone taper and cough syrup-no better. Hard cough all summer, since at least mid July. Occasionally chokes while eating. Persistent sinus drip. Coughed triggers heartburn despite Nexium and we again discussed cyclical cough and the importance of controlling GERD. Short of breath at rest some upper anterior chest wall pressure which is nonexertional. Nebulizer treatments helps some.  09/18/12- 60 year old male former smoker followed for asthma, allergic rhinitis complicated by CAD, GERD.... wife here FOLLOWS FOR: dry hacky cough; only during the day; insurance will not cover Tussionex-needs different cough syrup Same dry cough since July with no acute event. Nebulizer does help using new duoneb. Frontal headache, always stuffy, scant clear nasal discharge. Denies any reflux or aspiration event. Medications reviewed. CXR 04/17/12-  IMPRESSION:  Status post CABG. No active disease.  Original Report Authenticated By: Natasha Mead, M.D.  11/01/12- 60 year old male former  smoker followed for asthma, allergic rhinitis complicated by CAD, GERD.... wife here FOLLOWS FOR: cough went away but came back about 2 days ago-dry and hacky cough.Wheezing, SOB as well. Felt well after neb/ depo LOV, until past week. C/o incr head and chest congestion, post-nasal drip, chest tight. Denies a cold or fever. Using his neb 1x daily, Symbicort, Rhinocort.  05/10/13- 60 year old male former smoker followed for asthma, allergic rhinitis complicated by CAD, GERD.... wife here FOLLOWS FOR: denies any flare up Has done well through the summer and now early fall with no exacerbation. We discussed medications and vaccination CXR 02/22/13 IMPRESSION:  Hypoaeration without focal pulmonary opacity.  Original Report Authenticated By: Christiana Pellant, M.D.  01/14/14- 19 yoM former smoker followed for asthma, allergic rhinitis complicated by CAD, GERD.... wife here FOLLOWS FOR: Pt states his breathing is unchanged. Pt states with little ambulation and during the night pt becomes SOB. C/o recent dry cough x 2-3 days. Pt states he notices the chest tightness when he coughs.   Does not have a cold, but it for the last 2 or 3 days has had increased cough and chest congestion especially while lying down. Does not notice reflux or ankle edema. Using rescue inhaler once or twice daily. No fever or purulent sputum. CXR 12/17/13 IMPRESSION:  1. No acute cardiopulmonary abnormality.  2. Sequelae of prior CABG.  Electronically Signed  By: Rise Mu M.D.  On: 12/17/2013 19:24  07/18/14- 24 yoM former smoker followed for asthma/ bronchitis, allergic rhinitis complicated by CAD, GERD.... wife here FOLLOWS FOR: Refill HFa inhaler,Epipen, flonase, Duoneb, and Trazodone. Pt would like RX for cough syrup as well. Has cough in am ocasionally at night. Head a CT scan of the chest because of food hanging up he says. Report  reviewed. Dry cough now with no chest pain, wheeze, fever. Sometimes gets substernal  burning without exertion and admits some heartburn. He is pending shoulder surgery. CTa chest 04/01/14 IMPRESSION: No evidence of significant pulmonary embolus. Low lung volumes with hazy infiltrates bilaterally suggesting edema, pneumonia, or atelectasis appear Electronically Signed  By: Burman Nieves M.D.  On: 04/01/2014 00:10  01/19/15- 2 yoM former smoker followed for asthma/ bronchitis, allergic rhinitis complicated by CAD, GERD.... wife here Reports: pt presents with no complaints today.   He has felt recently quite well without acute episodes. Needs medications refilled which we discussed.  07/07/2015-60 year old male former smoker followed for asthma/bronchitis, allergic rhinitis complicated by CAD, GERD FOLLOWS FOR:  Breathing is unchanged.   Wife here       flu shot UTD Pending right shoulder repair/ Dr Dion Saucier Breathing unchanged recently with no acute events. Wife had a cold but he avoided. Using rescue inhaler about twice daily with recent weather change. Light cough with little sputum. No chest pain or palpitation area asks refill cough syrup to keep on hand-discussed. Office Spirometry 07/07/2015-mild restriction consistent with his abdominal obesity. FVC 3.28/74%, FEV1 2.71/78%, FEV1/FVC 0.83  02/15/2016-60 year old male former smoker followed for asthma/bronchitis, allergic rhinitis, complicated by CAD/CABG/ ACDF, GERD FOLLOWS FOR: Pt states his breathing is doing well overall at this time. Pt will need refills-marked on sheet. Wife here. They both report he is doing quite well now little cough. Using rescue inhaler maybe once a day especially if he is outdoors. Heat and humidity are bothersome. Had repair torn biceps tendon left arm. Needed meds refilled and asked to be able to hold Z-Pak and Tussionex in case needed for acute bronchitis-discussed. CXR 08/04/2015- poststernotomy, NAD, ACDF  ROS-HPI Constitutional:   No-   weight loss, night sweats, fevers, chills,  fatigue, lassitude. HEENT:   No-  headaches, difficulty swallowing, tooth/dental problems,  No-sore throat,       No-sneezing, itching, ear ache, nasal congestion, no-post nasal drip,  CV:  No-  Anginal  chest pain, orthopnea, PND, swelling in lower extremities, anasarca, dizziness, palpitations Resp: +   shortness of breath with exertion or at rest.              No- productive cough, +-non-productive cough,  No- coughing up of blood.              No-   change in color of mucus.  No- wheezing.   Skin: No-   rash or lesions. GI:  No-heartburn, indigestion, abdominal pain, nausea, vomiting,  GU:  MS: +  joint pain or swelling.  Neuro-     nothing unusual Psych:  No- change in mood or affect. No depression or anxiety.  No memory loss.  OBJ General- Alert, Oriented, Affect-appropriate, Distress- none acute, laconic, + obese Skin- rash-none, lesions- none, excoriation- none Lymphadenopathy- none Head- atraumatic            Eyes- Gross vision intact, PERRLA, conjunctivae clear secretions            Ears- Hearing, canals-normal            Nose-  , no-Septal dev, polyps, erosion, perforation             Throat- Mallampati II-III , mucosa clear , drainage- none, tonsils- atrophic, edentulous Neck- flexible , trachea midline, no stridor , thyroid nl, carotid no bruit Chest - symmetrical excursion , unlabored           Heart/CV- RRR , no murmur ,  no gallop  , no rub, nl s1 s2                           - JVD- none , edema- none, stasis changes- none, varices- none           Lung- clear,  dullness-none, rub- none, cough-none, wheeze-none           Chest wall- + sternotomy scar Abd-  Br/ Gen/ Rectal- Not done, not indicated Extrem- cyanosis- none, clubbing, none, atrophy- none, strength- nl Neuro- grossly intact to observation

## 2016-02-15 NOTE — Patient Instructions (Signed)
Med refills sent  Tussionex script printed  Please call as needed

## 2016-02-15 NOTE — Telephone Encounter (Signed)
Patient's wife has returned call and information for both referrals has been given. I did advise wife that it could be 2-4 weeks before they receive a call from the pain clinic for an appointment as they are normally scheduled a month or so out. She verbalized understanding.

## 2016-02-15 NOTE — Assessment & Plan Note (Signed)
Mild intermittent uncomplicated. We reviewed and discussed medications. I agreed with placing request that they be able to keep a Zpak and some Tussionex on hand for occasional acute bronchitis

## 2016-02-18 ENCOUNTER — Encounter: Payer: Self-pay | Admitting: *Deleted

## 2016-02-18 ENCOUNTER — Inpatient Hospital Stay
Admission: EM | Admit: 2016-02-18 | Discharge: 2016-02-21 | DRG: 392 | Disposition: A | Discharge status: Home / Self Care | Payer: BLUE CROSS/BLUE SHIELD | Attending: Internal Medicine | Admitting: Internal Medicine

## 2016-02-18 ENCOUNTER — Emergency Department: Payer: BLUE CROSS/BLUE SHIELD

## 2016-02-18 DIAGNOSIS — R350 Frequency of micturition: Secondary | ICD-10-CM | POA: Diagnosis not present

## 2016-02-18 DIAGNOSIS — Z825 Family history of asthma and other chronic lower respiratory diseases: Secondary | ICD-10-CM

## 2016-02-18 DIAGNOSIS — Z823 Family history of stroke: Secondary | ICD-10-CM | POA: Diagnosis not present

## 2016-02-18 DIAGNOSIS — K219 Gastro-esophageal reflux disease without esophagitis: Secondary | ICD-10-CM | POA: Diagnosis not present

## 2016-02-18 DIAGNOSIS — Z888 Allergy status to other drugs, medicaments and biological substances status: Secondary | ICD-10-CM

## 2016-02-18 DIAGNOSIS — Z87891 Personal history of nicotine dependence: Secondary | ICD-10-CM | POA: Diagnosis not present

## 2016-02-18 DIAGNOSIS — Z7982 Long term (current) use of aspirin: Secondary | ICD-10-CM

## 2016-02-18 DIAGNOSIS — K5900 Constipation, unspecified: Secondary | ICD-10-CM | POA: Diagnosis present

## 2016-02-18 DIAGNOSIS — I2510 Atherosclerotic heart disease of native coronary artery without angina pectoris: Secondary | ICD-10-CM | POA: Diagnosis not present

## 2016-02-18 DIAGNOSIS — E119 Type 2 diabetes mellitus without complications: Secondary | ICD-10-CM | POA: Diagnosis not present

## 2016-02-18 DIAGNOSIS — Z9889 Other specified postprocedural states: Secondary | ICD-10-CM | POA: Diagnosis not present

## 2016-02-18 DIAGNOSIS — K5732 Diverticulitis of large intestine without perforation or abscess without bleeding: Secondary | ICD-10-CM | POA: Diagnosis not present

## 2016-02-18 DIAGNOSIS — R3915 Urgency of urination: Secondary | ICD-10-CM | POA: Diagnosis not present

## 2016-02-18 DIAGNOSIS — M1990 Unspecified osteoarthritis, unspecified site: Secondary | ICD-10-CM | POA: Diagnosis not present

## 2016-02-18 DIAGNOSIS — K5792 Diverticulitis of intestine, part unspecified, without perforation or abscess without bleeding: Secondary | ICD-10-CM | POA: Diagnosis present

## 2016-02-18 DIAGNOSIS — K5730 Diverticulosis of large intestine without perforation or abscess without bleeding: Secondary | ICD-10-CM | POA: Diagnosis not present

## 2016-02-18 DIAGNOSIS — Z7984 Long term (current) use of oral hypoglycemic drugs: Secondary | ICD-10-CM | POA: Diagnosis not present

## 2016-02-18 DIAGNOSIS — I252 Old myocardial infarction: Secondary | ICD-10-CM | POA: Diagnosis not present

## 2016-02-18 DIAGNOSIS — R100 Acute abdomen: Secondary | ICD-10-CM | POA: Diagnosis not present

## 2016-02-18 DIAGNOSIS — Z79899 Other long term (current) drug therapy: Secondary | ICD-10-CM

## 2016-02-18 DIAGNOSIS — N401 Enlarged prostate with lower urinary tract symptoms: Secondary | ICD-10-CM | POA: Diagnosis not present

## 2016-02-18 DIAGNOSIS — G629 Polyneuropathy, unspecified: Secondary | ICD-10-CM | POA: Diagnosis present

## 2016-02-18 DIAGNOSIS — I10 Essential (primary) hypertension: Secondary | ICD-10-CM | POA: Diagnosis not present

## 2016-02-18 DIAGNOSIS — Z951 Presence of aortocoronary bypass graft: Secondary | ICD-10-CM | POA: Diagnosis not present

## 2016-02-18 DIAGNOSIS — E785 Hyperlipidemia, unspecified: Secondary | ICD-10-CM | POA: Diagnosis not present

## 2016-02-18 DIAGNOSIS — Z880 Allergy status to penicillin: Secondary | ICD-10-CM

## 2016-02-18 HISTORY — DX: Diverticulosis of large intestine without perforation or abscess without bleeding: K57.30

## 2016-02-18 LAB — COMPREHENSIVE METABOLIC PANEL
ALT: 14 U/L — ABNORMAL LOW (ref 17–63)
AST: 20 U/L (ref 15–41)
Albumin: 3.8 g/dL (ref 3.5–5.0)
Alkaline Phosphatase: 71 U/L (ref 38–126)
Anion gap: 8 (ref 5–15)
BUN: 20 mg/dL (ref 6–20)
CO2: 25 mmol/L (ref 22–32)
Calcium: 9.2 mg/dL (ref 8.9–10.3)
Chloride: 102 mmol/L (ref 101–111)
Creatinine, Ser: 0.99 mg/dL (ref 0.61–1.24)
GFR calc Af Amer: >60 mL/min (ref >60)
GFR calc non Af Amer: >60 mL/min (ref >60)
Glucose, Bld: 154 mg/dL — ABNORMAL HIGH (ref 65–99)
Potassium: 4.2 mmol/L (ref 3.5–5.1)
Sodium: 135 mmol/L (ref 135–145)
Total Bilirubin: 0.8 mg/dL (ref 0.3–1.2)
Total Protein: 7.1 g/dL (ref 6.5–8.1)

## 2016-02-18 LAB — CBC WITH DIFFERENTIAL/PLATELET
Basophils Absolute: 0 10*3/uL (ref 0–0.1)
Basophils Relative: 0 %
Eosinophils Absolute: 0.1 10*3/uL (ref 0–0.7)
Eosinophils Relative: 1 %
HCT: 43.7 % (ref 40.0–52.0)
Hemoglobin: 15.3 g/dL (ref 13.0–18.0)
Lymphocytes Relative: 13 %
Lymphs Abs: 1.8 10*3/uL (ref 1.0–3.6)
MCH: 29.1 pg (ref 26.0–34.0)
MCHC: 35 g/dL (ref 32.0–36.0)
MCV: 83 fL (ref 80.0–100.0)
Monocytes Absolute: 1.2 10*3/uL — ABNORMAL HIGH (ref 0.2–1.0)
Monocytes Relative: 9 %
Neutro Abs: 10.6 10*3/uL — ABNORMAL HIGH (ref 1.4–6.5)
Neutrophils Relative %: 77 %
Platelets: 198 10*3/uL (ref 150–440)
RBC: 5.27 MIL/uL (ref 4.40–5.90)
RDW: 14.4 % (ref 11.5–14.5)
WBC: 13.7 10*3/uL — ABNORMAL HIGH (ref 3.8–10.6)

## 2016-02-18 LAB — MRSA PCR SCREENING: MRSA by PCR: NEGATIVE

## 2016-02-18 LAB — URINALYSIS COMPLETE WITH MICROSCOPIC (ARMC ONLY)
Bacteria, UA: NONE SEEN
Glucose, UA: NEGATIVE mg/dL
Hgb urine dipstick: NEGATIVE
Leukocytes, UA: NEGATIVE
Nitrite: NEGATIVE
Protein, ur: 30 mg/dL — AB
Specific Gravity, Urine: 1.033 — ABNORMAL HIGH (ref 1.005–1.030)
pH: 5 (ref 5.0–8.0)

## 2016-02-18 LAB — LIPASE, BLOOD: Lipase: 18 U/L (ref 11–51)

## 2016-02-18 LAB — GLUCOSE, CAPILLARY
Glucose-Capillary: 80 mg/dL (ref 65–99)
Glucose-Capillary: 87 mg/dL (ref 65–99)

## 2016-02-18 MED ORDER — ENOXAPARIN SODIUM 40 MG/0.4ML ~~LOC~~ SOLN
40.0000 mg | SUBCUTANEOUS | Status: DC
Start: 2016-02-18 — End: 2016-02-21
  Administered 2016-02-18 – 2016-02-20 (×3): 40 mg via SUBCUTANEOUS
  Filled 2016-02-18 (×3): qty 0.4

## 2016-02-18 MED ORDER — INSULIN ASPART 100 UNIT/ML ~~LOC~~ SOLN
0.0000 [IU] | Freq: Three times a day (TID) | SUBCUTANEOUS | Status: DC
  Administered 2016-02-19: 1 [IU] via SUBCUTANEOUS
  Administered 2016-02-20 (×2): 2 [IU] via SUBCUTANEOUS
  Administered 2016-02-21: 1 [IU] via SUBCUTANEOUS
  Filled 2016-02-18: qty 2
  Filled 2016-02-18: qty 1
  Filled 2016-02-18 (×2): qty 2

## 2016-02-18 MED ORDER — HYDROMORPHONE HCL 1 MG/ML IJ SOLN
1.0000 mg | Freq: Once | INTRAMUSCULAR | Status: AC
  Administered 2016-02-18: 1 mg via INTRAVENOUS
  Filled 2016-02-18: qty 1

## 2016-02-18 MED ORDER — ONDANSETRON HCL 4 MG PO TABS: 4.0000 mg | ORAL_TABLET | Freq: Four times a day (QID) | ORAL | Status: DC | PRN

## 2016-02-18 MED ORDER — OXYCODONE HCL 5 MG PO TABS
5.0000 mg | ORAL_TABLET | ORAL | Status: DC | PRN
  Administered 2016-02-18 – 2016-02-21 (×5): 5 mg via ORAL
  Filled 2016-02-18 (×5): qty 1

## 2016-02-18 MED ORDER — IOPAMIDOL (ISOVUE-300) INJECTION 61%
100.0000 mL | Freq: Once | INTRAVENOUS | Status: AC | PRN
  Administered 2016-02-18: 100 mL via INTRAVENOUS

## 2016-02-18 MED ORDER — TRAZODONE HCL 50 MG PO TABS
150.0000 mg | ORAL_TABLET | Freq: Every day | ORAL | Status: DC
  Administered 2016-02-18 – 2016-02-20 (×3): 150 mg via ORAL
  Filled 2016-02-18 (×3): qty 1

## 2016-02-18 MED ORDER — METOPROLOL SUCCINATE ER 25 MG PO TB24
25.0000 mg | ORAL_TABLET | Freq: Every day | ORAL | Status: DC
  Administered 2016-02-18 – 2016-02-21 (×4): 25 mg via ORAL
  Filled 2016-02-18 (×4): qty 1

## 2016-02-18 MED ORDER — IPRATROPIUM BROMIDE 0.02 % IN SOLN: 0.5000 mg | Freq: Four times a day (QID) | RESPIRATORY_TRACT | Status: DC | PRN

## 2016-02-18 MED ORDER — DOCUSATE SODIUM 100 MG PO CAPS
100.0000 mg | ORAL_CAPSULE | Freq: Two times a day (BID) | ORAL | Status: DC
  Administered 2016-02-18 – 2016-02-21 (×6): 100 mg via ORAL
  Filled 2016-02-18 (×6): qty 1

## 2016-02-18 MED ORDER — MORPHINE SULFATE (PF) 2 MG/ML IV SOLN
2.0000 mg | INTRAVENOUS | Status: DC | PRN
Start: 2016-02-18 — End: 2016-02-21
  Administered 2016-02-18 – 2016-02-20 (×6): 2 mg via INTRAVENOUS
  Filled 2016-02-18 (×6): qty 1

## 2016-02-18 MED ORDER — SODIUM CHLORIDE 0.9 % IV SOLN
Freq: Once | INTRAVENOUS | Status: AC
  Administered 2016-02-18: 11:00:00 via INTRAVENOUS

## 2016-02-18 MED ORDER — PANTOPRAZOLE SODIUM 40 MG PO TBEC
40.0000 mg | DELAYED_RELEASE_TABLET | Freq: Every day | ORAL | Status: DC
  Administered 2016-02-18 – 2016-02-21 (×4): 40 mg via ORAL
  Filled 2016-02-18 (×4): qty 1

## 2016-02-18 MED ORDER — PIPERACILLIN-TAZOBACTAM 3.375 G IVPB
3.3750 g | Freq: Once | INTRAVENOUS | Status: AC
  Administered 2016-02-18: 3.375 g via INTRAVENOUS
  Filled 2016-02-18: qty 50

## 2016-02-18 MED ORDER — ACETAMINOPHEN 325 MG PO TABS: 650.0000 mg | ORAL_TABLET | Freq: Four times a day (QID) | ORAL | Status: DC | PRN

## 2016-02-18 MED ORDER — NITROGLYCERIN 0.4 MG SL SUBL: 0.4000 mg | SUBLINGUAL_TABLET | SUBLINGUAL | Status: DC | PRN

## 2016-02-18 MED ORDER — DIATRIZOATE MEGLUMINE & SODIUM 66-10 % PO SOLN
15.0000 mL | Freq: Once | ORAL | Status: AC
  Administered 2016-02-18: 15 mL via ORAL

## 2016-02-18 MED ORDER — MAGNESIUM OXIDE 400 (241.3 MG) MG PO TABS
800.0000 mg | ORAL_TABLET | Freq: Every day | ORAL | Status: DC
  Administered 2016-02-18 – 2016-02-21 (×4): 800 mg via ORAL
  Filled 2016-02-18 (×4): qty 2

## 2016-02-18 MED ORDER — GABAPENTIN 100 MG PO CAPS
100.0000 mg | ORAL_CAPSULE | Freq: Every day | ORAL | Status: DC
  Administered 2016-02-18 – 2016-02-20 (×3): 100 mg via ORAL
  Filled 2016-02-18 (×3): qty 1

## 2016-02-18 MED ORDER — SIMVASTATIN 20 MG PO TABS
10.0000 mg | ORAL_TABLET | Freq: Every day | ORAL | Status: DC
  Administered 2016-02-18 – 2016-02-20 (×3): 10 mg via ORAL
  Filled 2016-02-18 (×3): qty 1

## 2016-02-18 MED ORDER — EZETIMIBE-SIMVASTATIN 10-10 MG PO TABS: 1.0000 | ORAL_TABLET | Freq: Every day | ORAL | Status: DC

## 2016-02-18 MED ORDER — ACETAMINOPHEN 650 MG RE SUPP: 650.0000 mg | Freq: Four times a day (QID) | RECTAL | Status: DC | PRN

## 2016-02-18 MED ORDER — PIPERACILLIN-TAZOBACTAM 3.375 G IVPB
3.3750 g | Freq: Three times a day (TID) | INTRAVENOUS | Status: DC
  Administered 2016-02-18 – 2016-02-21 (×8): 3.375 g via INTRAVENOUS
  Filled 2016-02-18 (×11): qty 50

## 2016-02-18 MED ORDER — EZETIMIBE 10 MG PO TABS
10.0000 mg | ORAL_TABLET | Freq: Every day | ORAL | Status: DC
  Administered 2016-02-18 – 2016-02-20 (×3): 10 mg via ORAL
  Filled 2016-02-18 (×3): qty 1

## 2016-02-18 MED ORDER — ALBUTEROL SULFATE (2.5 MG/3ML) 0.083% IN NEBU
2.5000 mg | INHALATION_SOLUTION | Freq: Four times a day (QID) | RESPIRATORY_TRACT | Status: DC | PRN
Start: 2016-02-18 — End: 2016-02-21

## 2016-02-18 MED ORDER — ONDANSETRON HCL 4 MG/2ML IJ SOLN
4.0000 mg | Freq: Once | INTRAMUSCULAR | Status: AC
  Administered 2016-02-18: 4 mg via INTRAVENOUS
  Filled 2016-02-18: qty 2

## 2016-02-18 MED ORDER — FLUTICASONE PROPIONATE 50 MCG/ACT NA SUSP
2.0000 | Freq: Every day | NASAL | Status: DC | PRN
  Filled 2016-02-18: qty 16

## 2016-02-18 MED ORDER — LISINOPRIL 5 MG PO TABS
5.0000 mg | ORAL_TABLET | Freq: Every day | ORAL | Status: DC
  Administered 2016-02-18 – 2016-02-21 (×4): 5 mg via ORAL
  Filled 2016-02-18 (×4): qty 1

## 2016-02-18 MED ORDER — INSULIN ASPART 100 UNIT/ML ~~LOC~~ SOLN: 0.0000 [IU] | Freq: Every day | SUBCUTANEOUS | Status: DC

## 2016-02-18 MED ORDER — TESTOSTERONE 20.25 MG/1.25GM (1.62%) TD GEL: 1.0000 "application " | Freq: Every day | TRANSDERMAL | Status: DC

## 2016-02-18 MED ORDER — ONDANSETRON HCL 4 MG/2ML IJ SOLN
4.0000 mg | Freq: Four times a day (QID) | INTRAMUSCULAR | Status: DC | PRN
  Administered 2016-02-20 (×2): 4 mg via INTRAVENOUS
  Filled 2016-02-18 (×2): qty 2

## 2016-02-18 MED ORDER — MAGNESIUM OXIDE 400 MG PO TABS
800.0000 mg | ORAL_TABLET | Freq: Every day | ORAL | Status: DC
Start: 2016-02-18 — End: 2016-02-18

## 2016-02-18 MED ORDER — LORATADINE 10 MG PO TABS
10.0000 mg | ORAL_TABLET | Freq: Every day | ORAL | Status: DC
  Administered 2016-02-18 – 2016-02-21 (×4): 10 mg via ORAL
  Filled 2016-02-18 (×4): qty 1
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
## 2016-02-18 NOTE — Progress Notes (Signed)
Pharmacy Antibiotic Note  [name] is a 60 y.o. male admitted on 02/18/2016 with Intra-abdominal Infection.  Pharmacy has been consulted for Zosyn dosing.  Plan: Will start patient on Zosyn 3.375 IV EI every 8 hours. Patient received Zosyn 3.375 IV X1 in ED.  Height: 5\' 8"  (172.7 cm) Weight: 195 lb (88.451 kg) IBW/kg (Calculated) : 68.4  Temp (24hrs), Avg:97.8 F (36.6 C), Min:97.8 F (36.6 C), Max:97.8 F (36.6 C)   Recent Labs Lab 02/18/16 1030  WBC 13.7*  CREATININE 0.99    Estimated Creatinine Clearance: 84.7 mL/min (by C-G formula based on Cr of 0.99).    Allergies  Allergen Reactions  . 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 Niacin And Related Other (See Comments)    unknown    Antimicrobials this admission: 02/18/16 Zosyn >>   Thank you for allowing pharmacy to be a part of this patient's care.  Cher Nakai, PharmD Clinical Pharmacist 02/18/2016 12:56 PM

## 2016-02-18 NOTE — ED Notes (Signed)
Pt sent from urology office, pt has had lower abd pain for several months, was sent over for further evaluation of a "rupture", states difficulty and pain urinating, states hx of diverticulitis
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
## 2016-02-18 NOTE — ED Notes (Signed)
Admitting MD at bedside.

## 2016-02-19 ENCOUNTER — Other Ambulatory Visit: Payer: BLUE CROSS/BLUE SHIELD

## 2016-02-19 LAB — CBC
HCT: 39.8 % — ABNORMAL LOW (ref 40.0–52.0)
Hemoglobin: 13.8 g/dL (ref 13.0–18.0)
MCH: 28.9 pg (ref 26.0–34.0)
MCHC: 34.8 g/dL (ref 32.0–36.0)
MCV: 83.2 fL (ref 80.0–100.0)
Platelets: 170 10*3/uL (ref 150–440)
RBC: 4.78 MIL/uL (ref 4.40–5.90)
RDW: 14.4 % (ref 11.5–14.5)
WBC: 8.5 10*3/uL (ref 3.8–10.6)

## 2016-02-19 LAB — BASIC METABOLIC PANEL
Anion gap: 5 (ref 5–15)
BUN: 14 mg/dL (ref 6–20)
CO2: 28 mmol/L (ref 22–32)
Calcium: 8.6 mg/dL — ABNORMAL LOW (ref 8.9–10.3)
Chloride: 103 mmol/L (ref 101–111)
Creatinine, Ser: 0.89 mg/dL (ref 0.61–1.24)
GFR calc Af Amer: >60 mL/min (ref >60)
GFR calc non Af Amer: >60 mL/min (ref >60)
Glucose, Bld: 104 mg/dL — ABNORMAL HIGH (ref 65–99)
Potassium: 4.2 mmol/L (ref 3.5–5.1)
Sodium: 136 mmol/L (ref 135–145)

## 2016-02-19 LAB — GLUCOSE, CAPILLARY
Glucose-Capillary: 98 mg/dL (ref 65–99)
Glucose-Capillary: 95 mg/dL (ref 65–99)
Glucose-Capillary: 148 mg/dL — ABNORMAL HIGH (ref 65–99)
Glucose-Capillary: 121 mg/dL — ABNORMAL HIGH (ref 65–99)

## 2016-02-19 NOTE — Progress Notes (Addendum)
Pharmacy Antibiotic Note  [name] is a 60 y.o. male admitted on 02/18/2016 with Intra-abdominal Infection.  Pharmacy has been consulted for Zosyn dosing.  Plan: Will continue patient on Zosyn 3.375 IV EI every 8 hours. Patient received Zosyn 3.375 IV X1 in ED.  Height: 5\' 8"  (172.7 cm) Weight: 195 lb (88.451 kg) IBW/kg (Calculated) : 68.4  Temp (24hrs), Avg:98.4 F (36.9 C), Min:97.7 F (36.5 C), Max:99.2 F (37.3 C)   Recent Labs Lab 02/18/16 1030 02/19/16 0457  WBC 13.7* 8.5  CREATININE 0.99 0.89    Estimated Creatinine Clearance: 94.2 mL/min (by C-G formula based on Cr of 0.89).    Allergies  Allergen Reactions  . 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 Niacin And Related Other (See Comments)    unknown    Antimicrobials this admission: 02/18/16 Zosyn >>   Thank you for allowing pharmacy to be a part of this patient's care.  Demetrius Charity, PharmD  Clinical Pharmacist 02/19/2016 11:49 AM

## 2016-02-19 NOTE — Progress Notes (Signed)
[facility] - [place] at [facility]   PATIENT NAME: [name]    MR#:  [id number]  DATE OF BIRTH:  1955/11/27  SUBJECTIVE:  CHIEF COMPLAINT:   Chief Complaint  Patient presents with  . Abdominal Pain    repeated diverticulitis episodes.   On IV ABx- still have 8/10 pain.  Surgery saw him- suggest to follow in 2 weeks to schedule colectomy.  REVIEW OF SYSTEMS:  CONSTITUTIONAL: No fever, fatigue or weakness.  EYES: No blurred or double vision.  EARS, NOSE, AND THROAT: No tinnitus or ear pain.  RESPIRATORY: No cough, shortness of breath, wheezing or hemoptysis.  CARDIOVASCULAR: No chest pain, orthopnea, edema.  GASTROINTESTINAL: No nausea, vomiting, diarrhea , positive for abdominal pain.  GENITOURINARY: No dysuria, hematuria.  ENDOCRINE: No polyuria, nocturia,  HEMATOLOGY: No anemia, easy bruising or bleeding SKIN: No rash or lesion. MUSCULOSKELETAL: No joint pain or arthritis.   NEUROLOGIC: No tingling, numbness, weakness.  PSYCHIATRY: No anxiety or depression.   ROS  DRUG ALLERGIES:   Allergies  Allergen Reactions  . 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 Niacin And Related Other (See Comments)    unknown    VITALS:  Blood pressure 112/62, pulse 51, temperature 97.5 F (36.4 C), temperature source Oral, resp. rate 20, height 5\' 8"  (1.727 m), weight 88.451 kg (195 lb), SpO2 99 %.  PHYSICAL EXAMINATION:  GENERAL:  60 y.o.-year-old patient lying in the bed with no acute distress.  EYES: Pupils equal, round, reactive to light and accommodation. No scleral icterus. Extraocular muscles intact.  HEENT: Head atraumatic, normocephalic. Oropharynx and nasopharynx clear.  NECK:  Supple, no jugular venous distention. No thyroid enlargement, no tenderness.  LUNGS: Normal breath sounds bilaterally, no wheezing, rales,rhonchi  or crepitation. No use of accessory muscles of respiration.  CARDIOVASCULAR: S1, S2 normal. No murmurs, rubs, or gallops.  ABDOMEN: Soft, left lower quadrent tender, nondistended. Bowel sounds present. No organomegaly or mass.  EXTREMITIES: No pedal edema, cyanosis, or clubbing.  NEUROLOGIC: Cranial nerves II through XII are intact. Muscle strength 5/5 in all extremities. Sensation intact. Gait not checked.  PSYCHIATRIC: The patient is alert and oriented x 3.  SKIN: No obvious rash, lesion, or ulcer.   Physical Exam LABORATORY PANEL:   CBC  Recent Labs Lab 02/19/16 0457  WBC 8.5  HGB 13.8  HCT 39.8*  PLT 170   ------------------------------------------------------------------------------------------------------------------  Chemistries   Recent Labs Lab 02/18/16 1030 02/19/16 0457  NA 135 136  K 4.2 4.2  CL 102 103  CO2 25 28  GLUCOSE 154* 104*  BUN 20 14  CREATININE 0.99 0.89  CALCIUM 9.2 8.6*  AST 20  --   ALT 14*  --   ALKPHOS 71  --   BILITOT 0.8  --    ------------------------------------------------------------------------------------------------------------------  Cardiac Enzymes No results for input(s): TROPONINI in the last 168 hours. ------------------------------------------------------------------------------------------------------------------  RADIOLOGY:  Ct Abdomen Pelvis W Contrast  02/18/2016  CLINICAL DATA:  Lower abdominal pain. Dysuria. History of diverticulitis. EXAM: CT ABDOMEN AND PELVIS WITH CONTRAST TECHNIQUE: Multidetector CT imaging of the abdomen and pelvis was performed using the standard protocol following bolus administration of intravenous contrast. CONTRAST:  ISOVUE-300 IOPAMIDOL (ISOVUE-300) INJECTION 61% COMPARISON:  01/24/2016 FINDINGS: Lower chest: Linear subsegmental atelectasis in the left lower lobe. Right coronary artery atherosclerotic calcification. Hepatobiliary: Diffuse hepatic  steatosis.  Gallbladder unremarkable.  Pancreas: Unremarkable Spleen: Unremarkable Adrenals/Urinary Tract: Adrenal glands normal. Several fluid density exophytic lesions from the right kidney compatible with cysts. Mild right mid kidney scarring medially. Left mid kidney scarring anteriorly. 8 mm in long axis left mid kidney nonobstructive calculus. Several faint hypodense lesions in the left mid kidney and left kidney lower pole are technically nonspecific due to small size. No hydroureter or hydronephrosis. There is a small calcification along the posterior wall of the urinary bladder which is probably outside of the bladder lumen and unchanged from prior. Stomach/Bowel: There is a moderate degree of active sigmoid colon diverticulitis in the proximal sigmoid colon appreciable on images 67-79 of series 2, with surrounding stranding and inflamed diverticula as well as a trace amount of fluid along the posterolateral margin of the colon wall in this vicinity, but no discrete abscess or extraluminal gas identified. This is in a different location from previous ; on the exam from nearly 1 month ago the diverticulitis was in the distal sigmoid colon at the rectosigmoid junction. The distal sigmoid involvement previously shown has actually resolved. Vascular/Lymphatic: Aortoiliac atherosclerotic vascular disease. Patent mesenteric vessels. No pathologic adenopathy. Focal ectasia of the distal abdominal aorta up to 2.5 cm transverse with associated mural thrombus, about at the level of the inferior mesenteric artery takeoff. Reproductive: Faint linear calcifications along the margin of the central zone of the prostate gland. Other: No supplemental non-categorized findings. Musculoskeletal: Mild lumbar spondylosis and degenerative disc disease potentially with mild right foraminal stenosis the L5-S1 level. IMPRESSION: 1. Although the prior focus of distal sigmoid colon diverticulitis from last month has resolved, there is a new and larger region of moderate  active diverticulitis in the proximal sigmoid colon, with considerable surrounding mesenteric stranding and localize colon wall thickening, but without extraluminal gas or abscess. 2. Other imaging findings of potential clinical significance: Right coronary artery atherosclerotic calcification. Aortoiliac atherosclerotic vascular disease. Focal ectasia of the distal abdominal aorta without aneurysm. Diffuse hepatic steatosis. 8 mm left mid kidney nonobstructive calculus. Small calcification along the posterior wall of the urinary bladder, not thought to be within the lumen. Mild right foraminal stenosis at L5-S1 due to spurring. Electronically Signed   By: Gaylyn Rong M.D.   On: 02/18/2016 11:43    ASSESSMENT AND PLAN:   Active Problems:   Diverticulitis  1. Acute diverticulitis.   IV Zosyn, cont Liquid diet.   PRN  pain medication and nausea medication.  Since this is the fourth or fifth episode of diverticulitis he probably benefit from colon resection when better.   Appreciated Surgery consult- Follow in 2 weeks.   Suggested to have pulm and cardio clearance for surgery.   Pt already follows with both specialist at [facility].   I suggested him to make appointment next week for that.  2. History of CAD on aspirin and metoprolol 3. Type 2 diabetes we'll put on sliding scale 4. Hyperlipidemia unspecified on Vytorin 5. Neuropathy on Neurontin 6. History of asthmatic bronchitis on nebulizer treatments  Still have significant pain, so cont Liquid diet and IV abx today.  All the records are reviewed and case discussed with Care Management/Social Workerr. Management plans discussed with the patient, family and they are in agreement.  CODE STATUS: Full.  TOTAL TIME TAKING CARE OF THIS PATIENT: 35 minutes.    POSSIBLE D/C IN 1-2 DAYS, DEPENDING ON CLINICAL CONDITION.   Altamese Dilling M.D on 02/19/2016   Between 7am to 6pm - Pager - [phone]  After 6pm go to  [email or web address] - password [email or web address]  [facility] [place] [facility]  Office  [phone]  CC: Primary care physician; Imelda Pillow, NP  Note: This dictation was prepared with Dragon dictation along with smaller phrase technology. Any transcriptional errors that result from this process are unintentional.

## 2016-02-20 LAB — GLUCOSE, CAPILLARY
Glucose-Capillary: 151 mg/dL — ABNORMAL HIGH (ref 65–99)
Glucose-Capillary: 154 mg/dL — ABNORMAL HIGH (ref 65–99)
Glucose-Capillary: 99 mg/dL (ref 65–99)
Glucose-Capillary: 98 mg/dL (ref 65–99)

## 2016-02-20 NOTE — Progress Notes (Signed)
Pt's wife took brace for L arm home with her today

## 2016-02-20 NOTE — Progress Notes (Signed)
[facility] - [place] at [facility]   PATIENT NAME: [name]    MR#:  [id number]  DATE OF BIRTH:  10-Apr-1956  SUBJECTIVE:  CHIEF COMPLAINT:   Chief Complaint  Patient presents with  . Abdominal Pain  Seems comfortable, reports 6-7 out of 10 pain in the abdominal area, tolerating diet REVIEW OF SYSTEMS:  CONSTITUTIONAL: No fever, fatigue or weakness.  EYES: No blurred or double vision.  EARS, NOSE, AND THROAT: No tinnitus or ear pain.  RESPIRATORY: No cough, shortness of breath, wheezing or hemoptysis.  CARDIOVASCULAR: No chest pain, orthopnea, edema.  GASTROINTESTINAL: No nausea, vomiting, diarrhea , positive for abdominal pain.  GENITOURINARY: No dysuria, hematuria.  ENDOCRINE: No polyuria, nocturia,  HEMATOLOGY: No anemia, easy bruising or bleeding SKIN: No rash or lesion. MUSCULOSKELETAL: No joint pain or arthritis.   NEUROLOGIC: No tingling, numbness, weakness.  PSYCHIATRY: No anxiety or depression.   ROS  DRUG ALLERGIES:   Allergies  Allergen Reactions  . 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 Niacin And Related Other (See Comments)    unknown    VITALS:  Blood pressure 121/76, pulse 60, temperature 97.7 F (36.5 C), temperature source Oral, resp. rate 20, height 5\' 8"  (1.727 m), weight 88.451 kg (195 lb), SpO2 93 %. PHYSICAL EXAMINATION:  GENERAL:  60 y.o.-year-old patient lying in the bed with no acute distress.  EYES: Pupils equal, round, reactive to light and accommodation. No scleral icterus. Extraocular muscles intact.  HEENT: Head atraumatic, normocephalic. Oropharynx and nasopharynx clear.  NECK:  Supple, no jugular venous distention. No thyroid enlargement, no tenderness.  LUNGS: Normal breath sounds bilaterally, no wheezing, rales,rhonchi or crepitation. No use of accessory muscles of respiration.   CARDIOVASCULAR: S1, S2 normal. No murmurs, rubs, or gallops.  ABDOMEN: Soft, left lower quadrent tender, nondistended. Bowel sounds present. No organomegaly or mass.  EXTREMITIES: No pedal edema, cyanosis, or clubbing.  NEUROLOGIC: Cranial nerves II through XII are intact. Muscle strength 5/5 in all extremities. Sensation intact. Gait not checked.  PSYCHIATRIC: The patient is alert and oriented x 3.  SKIN: No obvious rash, lesion, or ulcer.   Physical Exam LABORATORY PANEL:   CBC  Recent Labs Lab 02/19/16 0457  WBC 8.5  HGB 13.8  HCT 39.8*  PLT 170   ------------------------------------------------------------------------------------------------------------------  Chemistries   Recent Labs Lab 02/18/16 1030 02/19/16 0457  NA 135 136  K 4.2 4.2  CL 102 103  CO2 25 28  GLUCOSE 154* 104*  BUN 20 14  CREATININE 0.99 0.89  CALCIUM 9.2 8.6*  AST 20  --   ALT 14*  --   ALKPHOS 71  --   BILITOT 0.8  --    ASSESSMENT AND PLAN:   Active Problems:   Diverticulitis  1. Acute diverticulitis.   IV Zosyn, cont Soft diet.   PRN  pain medication and nausea medication.  Since this is the fourth or fifth episode of diverticulitis he probably benefit from colon resection when better.   Appreciated Surgery consult- outpatient follow-up in 2 weeks.   Suggested to have pulm and cardio clearance for outpatient surgery.   Pt already follows with both specialist at [facility].   Patient has been asked to make appointment next week for that.  2. History of CAD on aspirin and metoprolol 3. Type 2 diabetes: sliding scale  4. Hyperlipidemia: on Vytorin 5. Neuropathy on Neurontin 6. History of asthmatic bronchitis on nebulizer treatments   Still have significant pain, soft diet   All the records are reviewed and case discussed with Care Management/Social Worker. Management plans discussed with the patient and nursing. they are in agreement.  CODE STATUS: Full.  TOTAL TIME  TAKING CARE OF THIS PATIENT: 35 minutes.    POSSIBLE D/C IN 1-2 DAYS, DEPENDING ON CLINICAL CONDITION.   [facility], Carrell Palmatier M.D on 02/20/2016   Between 7am to 6pm - Pager - [phone]  After 6pm go to [email or web address] - password [email or web address]  [facility] [place] [facility]  Office  [phone]  CC: Primary care physician; Imelda Pillow, NP  Note: This dictation was prepared with Dragon dictation along with smaller phrase technology. Any transcriptional errors that result from this process are unintentional.

## 2016-02-21 LAB — CBC
HCT: 43.4 % (ref 40.0–52.0)
Hemoglobin: 15.1 g/dL (ref 13.0–18.0)
MCH: 28.9 pg (ref 26.0–34.0)
MCHC: 34.9 g/dL (ref 32.0–36.0)
MCV: 83 fL (ref 80.0–100.0)
Platelets: 225 10*3/uL (ref 150–440)
RBC: 5.23 MIL/uL (ref 4.40–5.90)
RDW: 13.8 % (ref 11.5–14.5)
WBC: 7.2 10*3/uL (ref 3.8–10.6)

## 2016-02-21 LAB — BASIC METABOLIC PANEL
Anion gap: 8 (ref 5–15)
BUN: 19 mg/dL (ref 6–20)
CO2: 27 mmol/L (ref 22–32)
Calcium: 9.1 mg/dL (ref 8.9–10.3)
Chloride: 101 mmol/L (ref 101–111)
Creatinine, Ser: 0.97 mg/dL (ref 0.61–1.24)
GFR calc Af Amer: >60 mL/min (ref >60)
GFR calc non Af Amer: >60 mL/min (ref >60)
Glucose, Bld: 114 mg/dL — ABNORMAL HIGH (ref 65–99)
Potassium: 4.4 mmol/L (ref 3.5–5.1)
Sodium: 136 mmol/L (ref 135–145)

## 2016-02-21 MED ORDER — CIPROFLOXACIN HCL 500 MG PO TABS: 500.0000 mg | ORAL_TABLET | Freq: Two times a day (BID) | ORAL | Status: DC

## 2016-02-21 MED ORDER — IBUPROFEN 400 MG PO TABS: 400.0000 mg | ORAL_TABLET | Freq: Four times a day (QID) | ORAL | Status: DC | PRN

## 2016-02-21 MED ORDER — METRONIDAZOLE 500 MG PO TABS: 500.0000 mg | ORAL_TABLET | Freq: Three times a day (TID) | ORAL | Status: DC

## 2016-02-21 MED ORDER — ACETAMINOPHEN 325 MG PO TABS: 650.0000 mg | ORAL_TABLET | Freq: Four times a day (QID) | ORAL | Status: DC | PRN

## 2016-02-21 MED ORDER — OXYCODONE HCL 5 MG PO TABS: 5.0000 mg | ORAL_TABLET | Freq: Every day | ORAL | Status: DC | PRN

## 2016-02-21 NOTE — Discharge Summary (Signed)
[facility] - [place] at [facility]   PATIENT NAME: [name]    MR#:  [id number]  DATE OF BIRTH:  March 29, 1956  DATE OF ADMISSION:  02/18/2016 ADMITTING PHYSICIAN: Alford Highland, MD  DATE OF DISCHARGE: 02/21/2016 12:27 PM  PRIMARY CARE PHYSICIAN: HOLLAND, CHELSA, NP    ADMISSION DIAGNOSIS:  Diverticulitis of large intestine without perforation or abscess without bleeding [K57.32]  DISCHARGE DIAGNOSIS:  Active Problems:   Diverticulitis Recurrent diverticulitis SECONDARY DIAGNOSIS:   Past Medical History  Diagnosis Date  . GERD (gastroesophageal reflux disease)   . Asthma   . Allergic rhinitis   . Hypertension   . Hypercholesteremia   . Coronary artery disease   . Lumbar disc disease   . Myocardial infarction (HCC)   . Chronic bronchitis (HCC)   . Leg cramps   . Cramps, muscle, general   . Arthritis   . Impingement syndrome of left shoulder 07/25/2014  . Arthritis of left acromioclavicular joint 07/25/2014  . Diabetes mellitus without complication (HCC)   . Diverticula of colon    HOSPITAL COURSE:  60 year old male was admitted this afternoon with the episode of recurrent sigmoid diverticulitis.  He was slowly improving with IV Zosyn.  Considering this, being at least fifth or sixth episode of recurrent diverticulitis.  Surgical consultation was obtained.  They recommended elective resection once he recovers from the current episode. However he has history of some significant comorbid conditions. Prior to any elective surgery the risk assessment in reference to his heart and lungs and need to be completed and if felt to be reasonably under control will discuss surgical resection.  This was explained to the patient.  He and his family are in agreement to get elective surgery with preop cardiac and pulmonary assessment and clearance as an outpatient. DISCHARGE CONDITIONS:  Stable CONSULTS OBTAINED:  Treatment Team:  Kieth Brightly,  MD  DRUG ALLERGIES:   Allergies  Allergen Reactions  . 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 Niacin And Related Other (See Comments)    unknown    DISCHARGE MEDICATIONS:   Discharge Medication List as of 02/21/2016 11:54 AM    START taking these medications   Details  acetaminophen (TYLENOL) 325 MG tablet Take 2 tablets (650 mg total) by mouth every 6 (six) hours as needed for mild pain (or Fever >/= 101)., Starting 02/21/2016, Until Discontinued, Normal    ciprofloxacin (CIPRO) 500 MG tablet Take 1 tablet (500 mg total) by mouth 2 (two) times daily., Starting 02/21/2016, Until Discontinued, Normal    ibuprofen (ADVIL,MOTRIN) 400 MG tablet Take 1 tablet (400 mg total) by mouth every 6 (six) hours as needed., Starting 02/21/2016, Until Discontinued, Normal    metroNIDAZOLE (FLAGYL) 500 MG tablet Take 1 tablet (500 mg total) by mouth 3 (three) times daily., Starting 02/21/2016, Until Discontinued, Normal    oxyCODONE (OXY IR/ROXICODONE) 5 MG immediate release tablet Take 1 tablet (5 mg total) by mouth daily as needed., Starting 02/21/2016, Until Discontinued, Print      CONTINUE these medications which have NOT CHANGED   Details  albuterol (PROVENTIL HFA) 108 (90 Base) MCG/ACT inhaler Inhale 2 puffs into the lungs every 6 (six) hours as needed for wheezing or shortness of breath., Starting 02/15/2016, Until Wed 08/29/18, Normal    albuterol (PROVENTIL) (2.5 MG/3ML) 0.083% nebulizer  solution Take 2.5 mg by nebulization every 6 (six) hours as needed for wheezing or shortness of breath., Until Discontinued, Historical Med    aspirin EC 81 MG tablet Take 81 mg by mouth daily., Until Discontinued, Historical Med    chlorpheniramine-HYDROcodone (TUSSIONEX PENNKINETIC ER) 10-8 MG/5ML SUER Take 5 mLs by mouth every 12 (twelve) hours as needed for cough.,  Starting 02/15/2016, Until Discontinued, Print    docusate sodium (COLACE) 100 MG capsule Take 1 capsule (100 mg total) by mouth 2 (two) times daily., Starting 01/24/2016, Until Mon 01/23/17, Print    EPINEPHrine (EPIPEN 2-PAK) 0.3 mg/0.3 mL IJ SOAJ injection Inject 0.3 mg into the muscle as needed., Until Discontinued, Historical Med    esomeprazole (NEXIUM) 40 MG capsule Take 1 capsule (40 mg total) by mouth daily., Starting 02/15/2016, Until Discontinued, Normal    ezetimibe-simvastatin (VYTORIN) 10-10 MG per tablet Take 1 tablet by mouth daily. , Until Discontinued, Historical Med    fluticasone (FLONASE) 50 MCG/ACT nasal spray Place 2 sprays into both nostrils daily as needed. , Until Discontinued, Historical Med    gabapentin (NEURONTIN) 100 MG capsule Take 100 mg by mouth at bedtime., Until Discontinued, Historical Med    ipratropium (ATROVENT) 0.02 % nebulizer solution Take 0.5 mg by nebulization every 6 (six) hours as needed for wheezing or shortness of breath., Until Discontinued, Historical Med    lisinopril (PRINIVIL,ZESTRIL) 5 MG tablet Take 5 mg by mouth daily. , Starting 01/18/2016, Until Discontinued, Historical Med    loratadine (CLARITIN) 10 MG tablet Take 1 tablet (10 mg total) by mouth daily., Starting 02/15/2016, Until Discontinued, Normal    magnesium oxide (MAG-OX) 400 MG tablet Take 800 mg by mouth daily. , Until Discontinued, Historical Med    metFORMIN (GLUCOPHAGE) 500 MG tablet Take 1 tablet by mouth 2 (two) times daily., Starting 02/10/2016, Until Discontinued, Historical Med    metoprolol succinate (TOPROL-XL) 25 MG 24 hr tablet Take 25 mg by mouth daily. , Starting 01/11/2014, Until Discontinued, Historical Med    nitroGLYCERIN (NITROSTAT) 0.4 MG SL tablet Place 0.4 mg under the tongue every 5 (five) minutes as needed for chest pain. , Until Discontinued, Historical Med    ondansetron (ZOFRAN ODT) 4 MG disintegrating tablet Take 1 tablet (4 mg total) by mouth every 6  (six) hours as needed for nausea or vomiting., Starting 11/12/2015, Until Discontinued, Print    Testosterone (ANDROGEL) 20.25 MG/1.25GM (1.62%) GEL Place 1-2 application onto the skin daily. Apply 1-2 squirts to each shoulder, Until Discontinued, Historical Med    traZODone (DESYREL) 150 MG tablet Take 150 mg by mouth at bedtime., Until Discontinued, Historical Med      STOP taking these medications     azithromycin (ZITHROMAX) 250 MG tablet          DISCHARGE INSTRUCTIONS:    DIET:  Regular diet Avoid seeds and nuts DISCHARGE CONDITION:  Stable  ACTIVITY:  Activity as tolerated  OXYGEN:  Home Oxygen: No.   Oxygen Delivery: room air  DISCHARGE LOCATION:  home   If you experience worsening of your admission symptoms, develop shortness of breath, life threatening emergency, suicidal or homicidal thoughts you must seek medical attention immediately by calling 911 or calling your MD immediately  if symptoms less severe.  You Must read complete instructions/literature along with all the possible adverse reactions/side effects for all the Medicines you take and that have been prescribed to you. Take any new Medicines after you have completely understood and accpet all the possible  adverse reactions/side effects.   Please note  You were cared for by a hospitalist during your hospital stay. If you have any questions about your discharge medications or the care you received while you were in the hospital after you are discharged, you can call the unit and asked to speak with the hospitalist on call if the hospitalist that took care of you is not available. Once you are discharged, your primary care physician will handle any further medical issues. Please note that NO REFILLS for any discharge medications will be authorized once you are discharged, as it is imperative that you return to your primary care physician (or establish a relationship with a primary care physician if you do not  have one) for your aftercare needs so that they can reassess your need for medications and monitor your lab values.    On the day of Discharge:  VITAL SIGNS:  Blood pressure 107/67, pulse 60, temperature 98 F (36.7 C), temperature source Oral, resp. rate 16, height 5\' 8"  (1.727 m), weight 88.451 kg (195 lb), SpO2 92 %.  PHYSICAL EXAMINATION:  GENERAL:  60 y.o.-year-old patient lying in the bed with no acute distress.  EYES: Pupils equal, round, reactive to light and accommodation. No scleral icterus. Extraocular muscles intact.  HEENT: Head atraumatic, normocephalic. Oropharynx and nasopharynx clear.  NECK:  Supple, no jugular venous distention. No thyroid enlargement, no tenderness.  LUNGS: Normal breath sounds bilaterally, no wheezing, rales,rhonchi or crepitation. No use of accessory muscles of respiration.  CARDIOVASCULAR: S1, S2 normal. No murmurs, rubs, or gallops.  ABDOMEN: Soft, non-tender, non-distended. Bowel sounds present. No organomegaly or mass.  EXTREMITIES: No pedal edema, cyanosis, or clubbing.  NEUROLOGIC: Cranial nerves II through XII are intact. Muscle strength 5/5 in all extremities. Sensation intact. Gait not checked.  PSYCHIATRIC: The patient is alert and oriented x 3.  SKIN: No obvious rash, lesion, or ulcer.  DATA REVIEW:   CBC  Recent Labs Lab 02/21/16 0614  WBC 7.2  HGB 15.1  HCT 43.4  PLT 225    Chemistries   Recent Labs Lab 02/18/16 1030  02/21/16 0614  NA 135  < > 136  K 4.2  < > 4.4  CL 102  < > 101  CO2 25  < > 27  GLUCOSE 154*  < > 114*  BUN 20  < > 19  CREATININE 0.99  < > 0.97  CALCIUM 9.2  < > 9.1  AST 20  --   --   ALT 14*  --   --   ALKPHOS 71  --   --   BILITOT 0.8  --   --   < > = values in this interval not displayed.   Management plans discussed with the patient, family and they are in agreement.  CODE STATUS: Full code  TOTAL TIME TAKING CARE OF THIS PATIENT: 45 minutes.    [facility], [name] M.D on 02/21/2016 at 1:16  PM  Between 7am to 6pm - Pager - [phone]  After 6pm go to [email or web address] - password [email or web address]  [facility]  Office  [phone]  CC: Primary care physician; Imelda Pillow, NP Kieth Brightly MD  Note: This dictation was prepared with Dragon dictation along with smaller phrase technology. Any transcriptional errors that result from this process are unintentional.

## 2016-02-21 NOTE — Progress Notes (Signed)
Pt to be discharged per MD order. IV removed. Instructions reviewed with pt and family, all questions answered. Will be taken out in wheelchair once ready.

## 2016-02-21 NOTE — Discharge Instructions (Signed)
Diverticulitis  Diverticulitis is when small pockets that have formed in your colon (large intestine) become infected or swollen.  HOME CARE  · Follow your doctor's instructions.  · Follow a special diet if told by your doctor.  · When you feel better, your doctor Irion tell you to change your diet. You Ausborn be told to eat a lot of fiber. Fruits and vegetables are good sources of fiber. Fiber makes it easier to poop (have bowel movements).  · Take supplements or probiotics as told by your doctor.  · Only take medicines as told by your doctor.  · Keep all follow-up visits with your doctor.  GET HELP IF:  · Your pain does not get better.  · You have a hard time eating food.  · You are not pooping like normal.  GET HELP RIGHT AWAY IF:  · Your pain gets worse.  · Your problems do not get better.  · Your problems suddenly get worse.  · You have a fever.  · You keep throwing up (vomiting).  · You have bloody or black, tarry poop (stool).  MAKE SURE YOU:   · Understand these instructions.  · Will watch your condition.  · Will get help right away if you are not doing well or get worse.     This information is not intended to replace advice given to you by your health care provider. Make sure you discuss any questions you have with your health care provider.     Document Released: 01/11/2008 Document Revised: 07/30/2013 Document Reviewed: 06/19/2013  Elsevier Interactive Patient Education ©2016 [employer]

## 2016-02-24 ENCOUNTER — Telehealth: Payer: Self-pay | Admitting: Internal Medicine

## 2016-02-24 ENCOUNTER — Encounter: Payer: Self-pay | Admitting: *Deleted

## 2016-02-24 NOTE — Telephone Encounter (Signed)
Pt wife calling and would like the surgery dr seeolaouthur sanker fax # [phone] can be sent through epic.Caren Griffins

## 2016-02-24 NOTE — Telephone Encounter (Signed)
Spoke with the pt's spouse, [name]  She states that the pt is needing surgery clearance for colon surgery (he has diverticulitis) The surgery is not scheduled yet, but he is going to see surgeon on 03/08/16  His last ov here with [facility] was 02/15/16 and he has no pending appt here  Please advise, thanks!

## 2016-02-24 NOTE — Telephone Encounter (Signed)
Mr Zay Augusta is stable at this time from a pulmonary standpoint for necessary abdominal surgery. There is a cardiac history so recommend clearance also from his cardiologist.

## 2016-02-24 NOTE — Telephone Encounter (Signed)
Letter has been written. Attempted to contact the pt's wife. No answer, no option to leave a message. We need to know Dr. Luan Moore first name if we are going to route it through Epic.

## 2016-02-24 NOTE — Telephone Encounter (Signed)
Will forward back to CDY to address original msg

## 2016-02-24 NOTE — Telephone Encounter (Signed)
Letter faxed to the Dr Evette Cristal to the number provided  Spouse aware and nothing further needed

## 2016-02-25 ENCOUNTER — Ambulatory Visit
Admission: RE | Admit: 2016-02-25 | Discharge: 2016-02-25 | Disposition: A | Discharge status: Home / Self Care | Payer: BLUE CROSS/BLUE SHIELD | Source: Ambulatory Visit | Attending: Orthopedic Surgery | Admitting: Orthopedic Surgery

## 2016-02-25 DIAGNOSIS — M25522 Pain in left elbow: Secondary | ICD-10-CM | POA: Diagnosis not present

## 2016-02-26 LAB — GLUCOSE, CAPILLARY
Glucose-Capillary: 128 mg/dL — ABNORMAL HIGH (ref 65–99)
Glucose-Capillary: 124 mg/dL — ABNORMAL HIGH (ref 65–99)

## 2016-03-02 DIAGNOSIS — M66822 Spontaneous rupture of other tendons, left upper arm: Secondary | ICD-10-CM | POA: Diagnosis not present

## 2016-03-08 ENCOUNTER — Encounter: Payer: Self-pay | Admitting: General Surgery

## 2016-03-08 ENCOUNTER — Ambulatory Visit (INDEPENDENT_AMBULATORY_CARE_PROVIDER_SITE_OTHER): Payer: BLUE CROSS/BLUE SHIELD | Admitting: General Surgery

## 2016-03-08 VITALS — BP 104/64 | HR 70 | Resp 16 | Ht 68.0 in | Wt 197.0 lb

## 2016-03-08 DIAGNOSIS — K5732 Diverticulitis of large intestine without perforation or abscess without bleeding: Secondary | ICD-10-CM | POA: Diagnosis not present
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
## 2016-03-08 NOTE — Patient Instructions (Signed)
The patient is aware to call back for any questions or concerns.  

## 2016-03-10 ENCOUNTER — Inpatient Hospital Stay (HOSPITAL_COMMUNITY): Payer: BLUE CROSS/BLUE SHIELD | Admitting: Anesthesiology

## 2016-03-10 ENCOUNTER — Observation Stay (HOSPITAL_COMMUNITY)
Admission: RE | Admit: 2016-03-10 | Discharge: 2016-03-12 | DRG: 909 | Disposition: A | Discharge status: Home / Self Care | Payer: BLUE CROSS/BLUE SHIELD | Source: Ambulatory Visit | Attending: Surgery | Admitting: Surgery

## 2016-03-10 ENCOUNTER — Encounter (HOSPITAL_COMMUNITY)
Admission: RE | Disposition: A | Discharge status: Home / Self Care | Payer: Self-pay | Source: Ambulatory Visit | Attending: Surgery

## 2016-03-10 DIAGNOSIS — G8918 Other acute postprocedural pain: Secondary | ICD-10-CM | POA: Diagnosis not present

## 2016-03-10 DIAGNOSIS — I9751 Accidental puncture and laceration of a circulatory system organ or structure during a circulatory system procedure: Principal | ICD-10-CM | POA: Insufficient documentation

## 2016-03-10 DIAGNOSIS — J45909 Unspecified asthma, uncomplicated: Secondary | ICD-10-CM | POA: Insufficient documentation

## 2016-03-10 DIAGNOSIS — E785 Hyperlipidemia, unspecified: Secondary | ICD-10-CM | POA: Diagnosis not present

## 2016-03-10 DIAGNOSIS — J42 Unspecified chronic bronchitis: Secondary | ICD-10-CM | POA: Diagnosis not present

## 2016-03-10 DIAGNOSIS — E119 Type 2 diabetes mellitus without complications: Secondary | ICD-10-CM | POA: Insufficient documentation

## 2016-03-10 DIAGNOSIS — Z79899 Other long term (current) drug therapy: Secondary | ICD-10-CM | POA: Diagnosis not present

## 2016-03-10 DIAGNOSIS — S45102A Unspecified injury of brachial artery, left side, initial encounter: Secondary | ICD-10-CM | POA: Diagnosis not present

## 2016-03-10 DIAGNOSIS — Z7982 Long term (current) use of aspirin: Secondary | ICD-10-CM | POA: Diagnosis not present

## 2016-03-10 DIAGNOSIS — I10 Essential (primary) hypertension: Secondary | ICD-10-CM | POA: Diagnosis not present

## 2016-03-10 DIAGNOSIS — Y92530 Ambulatory surgery center as the place of occurrence of the external cause: Secondary | ICD-10-CM | POA: Insufficient documentation

## 2016-03-10 DIAGNOSIS — I252 Old myocardial infarction: Secondary | ICD-10-CM | POA: Diagnosis not present

## 2016-03-10 DIAGNOSIS — Y658 Other specified misadventures during surgical and medical care: Secondary | ICD-10-CM | POA: Insufficient documentation

## 2016-03-10 DIAGNOSIS — Z981 Arthrodesis status: Secondary | ICD-10-CM | POA: Insufficient documentation

## 2016-03-10 DIAGNOSIS — K219 Gastro-esophageal reflux disease without esophagitis: Secondary | ICD-10-CM | POA: Diagnosis not present

## 2016-03-10 DIAGNOSIS — Z951 Presence of aortocoronary bypass graft: Secondary | ICD-10-CM | POA: Diagnosis not present

## 2016-03-10 DIAGNOSIS — I2510 Atherosclerotic heart disease of native coronary artery without angina pectoris: Secondary | ICD-10-CM | POA: Diagnosis not present

## 2016-03-10 DIAGNOSIS — S46212A Strain of muscle, fascia and tendon of other parts of biceps, left arm, initial encounter: Secondary | ICD-10-CM | POA: Diagnosis not present

## 2016-03-10 DIAGNOSIS — Z87891 Personal history of nicotine dependence: Secondary | ICD-10-CM | POA: Insufficient documentation

## 2016-03-10 DIAGNOSIS — T8131XA Disruption of external operation (surgical) wound, not elsewhere classified, initial encounter: Secondary | ICD-10-CM | POA: Diagnosis not present

## 2016-03-10 DIAGNOSIS — Z955 Presence of coronary angioplasty implant and graft: Secondary | ICD-10-CM | POA: Diagnosis not present

## 2016-03-10 DIAGNOSIS — Z7984 Long term (current) use of oral hypoglycemic drugs: Secondary | ICD-10-CM | POA: Insufficient documentation

## 2016-03-10 DIAGNOSIS — T8189XA Other complications of procedures, not elsewhere classified, initial encounter: Secondary | ICD-10-CM | POA: Diagnosis not present

## 2016-03-10 HISTORY — DX: Laceration of brachial artery, unspecified side, initial encounter: S45.119A

## 2016-03-10 HISTORY — PX: BICEPS TENDON REPAIR: SHX566

## 2016-03-10 HISTORY — PX: BYPASS AXILLA/BRACHIAL ARTERY: SHX6426

## 2016-03-10 LAB — CBC
HCT: 38.8 % — ABNORMAL LOW (ref 39.0–52.0)
Hemoglobin: 12.6 g/dL — ABNORMAL LOW (ref 13.0–17.0)
MCH: 28.2 pg (ref 26.0–34.0)
MCHC: 32.5 g/dL (ref 30.0–36.0)
MCV: 86.8 fL (ref 78.0–100.0)
Platelets: 162 10*3/uL (ref 150–400)
RBC: 4.47 MIL/uL (ref 4.22–5.81)
RDW: 13.6 % (ref 11.5–15.5)
WBC: 9.6 10*3/uL (ref 4.0–10.5)

## 2016-03-10 LAB — BASIC METABOLIC PANEL
Anion gap: 6 (ref 5–15)
BUN: 13 mg/dL (ref 6–20)
CO2: 28 mmol/L (ref 22–32)
Calcium: 8.3 mg/dL — ABNORMAL LOW (ref 8.9–10.3)
Chloride: 101 mmol/L (ref 101–111)
Creatinine, Ser: 0.89 mg/dL (ref 0.61–1.24)
GFR calc Af Amer: >60 mL/min (ref >60)
GFR calc non Af Amer: >60 mL/min (ref >60)
Glucose, Bld: 102 mg/dL — ABNORMAL HIGH (ref 65–99)
Potassium: 4.2 mmol/L (ref 3.5–5.1)
Sodium: 135 mmol/L (ref 135–145)

## 2016-03-10 LAB — GLUCOSE, CAPILLARY: Glucose-Capillary: 82 mg/dL (ref 65–99)

## 2016-03-10 SURGERY — CREATION, BYPASS, ARTERIAL, AXILLARY TO BRACHIAL
Anesthesia: General | Laterality: Left

## 2016-03-10 MED ORDER — ROCURONIUM BROMIDE 100 MG/10ML IV SOLN
INTRAVENOUS | Status: DC | PRN
  Administered 2016-03-10: 50 mg via INTRAVENOUS

## 2016-03-10 MED ORDER — FENTANYL CITRATE (PF) 100 MCG/2ML IJ SOLN
INTRAMUSCULAR | Status: DC | PRN
  Administered 2016-03-10: 50 ug via INTRAVENOUS

## 2016-03-10 MED ORDER — VANCOMYCIN HCL 1000 MG IV SOLR
INTRAVENOUS | Status: DC | PRN
  Administered 2016-03-10: 1000 mg via INTRAVENOUS

## 2016-03-10 MED ORDER — ONDANSETRON HCL 4 MG/2ML IJ SOLN
INTRAMUSCULAR | Status: DC | PRN
Start: 2016-03-10 — End: 2016-03-10
  Administered 2016-03-10: 4 mg via INTRAVENOUS

## 2016-03-10 MED ORDER — ALUM & MAG HYDROXIDE-SIMETH 200-200-20 MG/5ML PO SUSP: 15.0000 mL | ORAL | Status: DC | PRN

## 2016-03-10 MED ORDER — FENTANYL CITRATE (PF) 100 MCG/2ML IJ SOLN
25.0000 ug | INTRAMUSCULAR | Status: DC | PRN
  Administered 2016-03-10 (×2): 50 ug via INTRAVENOUS

## 2016-03-10 MED ORDER — LORATADINE 10 MG PO TABS
10.0000 mg | ORAL_TABLET | Freq: Every day | ORAL | Status: DC
  Administered 2016-03-11 – 2016-03-12 (×2): 10 mg via ORAL
  Filled 2016-03-10 (×2): qty 1

## 2016-03-10 MED ORDER — SENNOSIDES-DOCUSATE SODIUM 8.6-50 MG PO TABS: 1.0000 | ORAL_TABLET | Freq: Every evening | ORAL | Status: DC | PRN

## 2016-03-10 MED ORDER — FENTANYL CITRATE (PF) 100 MCG/2ML IJ SOLN
INTRAMUSCULAR | Status: AC
  Administered 2016-03-10: 50 ug via INTRAVENOUS
  Filled 2016-03-10: qty 2

## 2016-03-10 MED ORDER — PANTOPRAZOLE SODIUM 40 MG PO TBEC
40.0000 mg | DELAYED_RELEASE_TABLET | Freq: Every day | ORAL | Status: DC
  Administered 2016-03-11 – 2016-03-12 (×2): 40 mg via ORAL
  Filled 2016-03-10 (×2): qty 1

## 2016-03-10 MED ORDER — FENTANYL CITRATE (PF) 250 MCG/5ML IJ SOLN
INTRAMUSCULAR | Status: AC
  Filled 2016-03-10: qty 5

## 2016-03-10 MED ORDER — IBUPROFEN 200 MG PO TABS: 400.0000 mg | ORAL_TABLET | Freq: Four times a day (QID) | ORAL | Status: DC | PRN

## 2016-03-10 MED ORDER — ENOXAPARIN SODIUM 30 MG/0.3ML ~~LOC~~ SOLN
30.0000 mg | SUBCUTANEOUS | Status: DC
  Administered 2016-03-11: 30 mg via SUBCUTANEOUS
  Filled 2016-03-10: qty 0.3

## 2016-03-10 MED ORDER — OXYCODONE-ACETAMINOPHEN 5-325 MG PO TABS
1.0000 | ORAL_TABLET | ORAL | Status: DC | PRN
  Administered 2016-03-10 – 2016-03-12 (×8): 2 via ORAL
  Filled 2016-03-10 (×8): qty 2

## 2016-03-10 MED ORDER — PHENOL 1.4 % MT LIQD: 1.0000 | OROMUCOSAL | Status: DC | PRN

## 2016-03-10 MED ORDER — SUCCINYLCHOLINE CHLORIDE 20 MG/ML IJ SOLN
INTRAMUSCULAR | Status: DC | PRN
  Administered 2016-03-10: 100 mg via INTRAVENOUS

## 2016-03-10 MED ORDER — EZETIMIBE-SIMVASTATIN 10-10 MG PO TABS: 1.0000 | ORAL_TABLET | Freq: Every day | ORAL | Status: DC

## 2016-03-10 MED ORDER — EZETIMIBE 10 MG PO TABS
10.0000 mg | ORAL_TABLET | Freq: Every day | ORAL | Status: DC
  Administered 2016-03-11 – 2016-03-12 (×2): 10 mg via ORAL
  Filled 2016-03-10 (×2): qty 1

## 2016-03-10 MED ORDER — HEMOSTATIC AGENTS (NO CHARGE) OPTIME
TOPICAL | Status: DC | PRN
  Administered 2016-03-10: 2 via TOPICAL

## 2016-03-10 MED ORDER — ASPIRIN EC 81 MG PO TBEC
81.0000 mg | DELAYED_RELEASE_TABLET | Freq: Every day | ORAL | Status: DC
  Administered 2016-03-11 – 2016-03-12 (×2): 81 mg via ORAL
  Filled 2016-03-10 (×2): qty 1

## 2016-03-10 MED ORDER — METFORMIN HCL 500 MG PO TABS
500.0000 mg | ORAL_TABLET | Freq: Two times a day (BID) | ORAL | Status: DC
  Administered 2016-03-11 – 2016-03-12 (×3): 500 mg via ORAL
  Filled 2016-03-10 (×3): qty 1

## 2016-03-10 MED ORDER — PROPOFOL 10 MG/ML IV BOLUS
INTRAVENOUS | Status: DC | PRN
  Administered 2016-03-10: 50 mg via INTRAVENOUS

## 2016-03-10 MED ORDER — MORPHINE SULFATE (PF) 2 MG/ML IV SOLN
2.0000 mg | INTRAVENOUS | Status: DC | PRN
  Administered 2016-03-10 – 2016-03-11 (×3): 2 mg via INTRAVENOUS
  Filled 2016-03-10 (×3): qty 1

## 2016-03-10 MED ORDER — PHENYLEPHRINE HCL 10 MG/ML IJ SOLN
INTRAMUSCULAR | Status: DC | PRN
  Administered 2016-03-10: 50 ug/min via INTRAVENOUS

## 2016-03-10 MED ORDER — ALBUTEROL SULFATE (2.5 MG/3ML) 0.083% IN NEBU: 2.5000 mg | INHALATION_SOLUTION | Freq: Four times a day (QID) | RESPIRATORY_TRACT | Status: DC | PRN

## 2016-03-10 MED ORDER — BISACODYL 5 MG PO TBEC: 5.0000 mg | DELAYED_RELEASE_TABLET | Freq: Every day | ORAL | Status: DC | PRN

## 2016-03-10 MED ORDER — NITROGLYCERIN 0.4 MG SL SUBL: 0.4000 mg | SUBLINGUAL_TABLET | SUBLINGUAL | Status: DC | PRN

## 2016-03-10 MED ORDER — ETOMIDATE 2 MG/ML IV SOLN
INTRAVENOUS | Status: DC | PRN
  Administered 2016-03-10: 16 mg via INTRAVENOUS

## 2016-03-10 MED ORDER — ACETAMINOPHEN 325 MG PO TABS
650.0000 mg | ORAL_TABLET | Freq: Four times a day (QID) | ORAL | Status: DC | PRN
Start: 2016-03-10 — End: 2016-03-12

## 2016-03-10 MED ORDER — TRAZODONE HCL 150 MG PO TABS
150.0000 mg | ORAL_TABLET | Freq: Every day | ORAL | Status: DC
  Administered 2016-03-10 – 2016-03-11 (×2): 150 mg via ORAL
  Filled 2016-03-10 (×2): qty 1

## 2016-03-10 MED ORDER — MAGNESIUM OXIDE 400 (241.3 MG) MG PO TABS
800.0000 mg | ORAL_TABLET | Freq: Every day | ORAL | Status: DC
  Administered 2016-03-11 – 2016-03-12 (×2): 800 mg via ORAL
  Filled 2016-03-10 (×3): qty 2

## 2016-03-10 MED ORDER — DOCUSATE SODIUM 100 MG PO CAPS
100.0000 mg | ORAL_CAPSULE | Freq: Two times a day (BID) | ORAL | Status: DC
  Administered 2016-03-10 – 2016-03-12 (×4): 100 mg via ORAL
  Filled 2016-03-10 (×4): qty 1

## 2016-03-10 MED ORDER — METOPROLOL SUCCINATE ER 25 MG PO TB24
25.0000 mg | ORAL_TABLET | Freq: Every day | ORAL | Status: DC
  Filled 2016-03-10: qty 1

## 2016-03-10 MED ORDER — POTASSIUM CHLORIDE CRYS ER 20 MEQ PO TBCR: 20.0000 meq | EXTENDED_RELEASE_TABLET | Freq: Every day | ORAL | Status: DC | PRN

## 2016-03-10 MED ORDER — ONDANSETRON HCL 4 MG/2ML IJ SOLN: 4.0000 mg | Freq: Once | INTRAMUSCULAR | Status: DC | PRN

## 2016-03-10 MED ORDER — PROTAMINE SULFATE 10 MG/ML IV SOLN
INTRAVENOUS | Status: DC | PRN
  Administered 2016-03-10: 50 mg via INTRAVENOUS

## 2016-03-10 MED ORDER — 0.9 % SODIUM CHLORIDE (POUR BTL) OPTIME
TOPICAL | Status: DC | PRN
  Administered 2016-03-10: 1000 mL

## 2016-03-10 MED ORDER — LIDOCAINE HCL (CARDIAC) 20 MG/ML IV SOLN
INTRAVENOUS | Status: DC | PRN
  Administered 2016-03-10: 100 mg via INTRAVENOUS

## 2016-03-10 MED ORDER — SUGAMMADEX SODIUM 200 MG/2ML IV SOLN
INTRAVENOUS | Status: DC | PRN
  Administered 2016-03-10: 200 mg via INTRAVENOUS

## 2016-03-10 MED ORDER — LISINOPRIL 5 MG PO TABS
5.0000 mg | ORAL_TABLET | Freq: Every day | ORAL | Status: DC
  Administered 2016-03-12: 5 mg via ORAL
  Filled 2016-03-10 (×2): qty 1

## 2016-03-10 MED ORDER — MIDAZOLAM HCL 2 MG/2ML IJ SOLN
INTRAMUSCULAR | Status: AC
  Filled 2016-03-10: qty 2

## 2016-03-10 MED ORDER — ONDANSETRON HCL 4 MG/2ML IJ SOLN: 4.0000 mg | Freq: Four times a day (QID) | INTRAMUSCULAR | Status: DC | PRN

## 2016-03-10 MED ORDER — MIDAZOLAM HCL 5 MG/5ML IJ SOLN
INTRAMUSCULAR | Status: DC | PRN
  Administered 2016-03-10: 1 mg via INTRAVENOUS

## 2016-03-10 MED ORDER — ONDANSETRON 4 MG PO TBDP
4.0000 mg | ORAL_TABLET | Freq: Four times a day (QID) | ORAL | Status: DC | PRN
  Filled 2016-03-10: qty 1

## 2016-03-10 MED ORDER — HEPARIN SODIUM (PORCINE) 1000 UNIT/ML IJ SOLN
INTRAMUSCULAR | Status: DC | PRN
  Administered 2016-03-10: 9000 [IU] via INTRAVENOUS

## 2016-03-10 MED ORDER — SODIUM CHLORIDE 0.9 % IV SOLN: 500.0000 mL | Freq: Once | INTRAVENOUS | Status: DC | PRN

## 2016-03-10 MED ORDER — HYDRALAZINE HCL 20 MG/ML IJ SOLN: 5.0000 mg | INTRAMUSCULAR | Status: DC | PRN

## 2016-03-10 MED ORDER — METOPROLOL TARTRATE 5 MG/5ML IV SOLN: 2.0000 mg | INTRAVENOUS | Status: DC | PRN

## 2016-03-10 MED ORDER — GABAPENTIN 100 MG PO CAPS
100.0000 mg | ORAL_CAPSULE | Freq: Every day | ORAL | Status: DC
  Administered 2016-03-10 – 2016-03-11 (×2): 100 mg via ORAL
  Filled 2016-03-10 (×2): qty 1

## 2016-03-10 MED ORDER — VANCOMYCIN HCL IN DEXTROSE 1-5 GM/200ML-% IV SOLN
INTRAVENOUS | Status: AC
  Filled 2016-03-10: qty 200

## 2016-03-10 MED ORDER — PROPOFOL 10 MG/ML IV BOLUS
INTRAVENOUS | Status: AC
  Filled 2016-03-10: qty 20

## 2016-03-10 MED ORDER — SIMVASTATIN 10 MG PO TABS
10.0000 mg | ORAL_TABLET | Freq: Every day | ORAL | Status: DC
  Administered 2016-03-11: 10 mg via ORAL
  Filled 2016-03-10: qty 1

## 2016-03-10 MED ORDER — SODIUM CHLORIDE 0.9 % IV SOLN
INTRAVENOUS | Status: DC | PRN
  Administered 2016-03-10: 16:00:00

## 2016-03-10 MED ORDER — LACTATED RINGERS IV SOLN
INTRAVENOUS | Status: DC | PRN
  Administered 2016-03-10 (×2): via INTRAVENOUS

## 2016-03-10 MED ORDER — METRONIDAZOLE 500 MG PO TABS
500.0000 mg | ORAL_TABLET | Freq: Three times a day (TID) | ORAL | Status: DC
  Administered 2016-03-10 – 2016-03-12 (×5): 500 mg via ORAL
  Filled 2016-03-10 (×5): qty 1

## 2016-03-10 MED ORDER — IPRATROPIUM BROMIDE 0.02 % IN SOLN: 0.5000 mg | Freq: Four times a day (QID) | RESPIRATORY_TRACT | Status: DC | PRN

## 2016-03-10 MED ORDER — LABETALOL HCL 5 MG/ML IV SOLN: 10.0000 mg | INTRAVENOUS | Status: DC | PRN

## 2016-03-10 MED ORDER — ALBUTEROL SULFATE HFA 108 (90 BASE) MCG/ACT IN AERS: 2.0000 | INHALATION_SPRAY | Freq: Four times a day (QID) | RESPIRATORY_TRACT | Status: DC | PRN

## 2016-03-10 MED ORDER — MAGNESIUM SULFATE 2 GM/50ML IV SOLN
2.0000 g | Freq: Every day | INTRAVENOUS | Status: DC | PRN
  Filled 2016-03-10: qty 50

## 2016-03-10 MED ORDER — SODIUM CHLORIDE 0.9 % IV SOLN
INTRAVENOUS | Status: DC
  Administered 2016-03-10: 23:00:00 via INTRAVENOUS

## 2016-03-10 MED ORDER — HYDROCOD POLST-CPM POLST ER 10-8 MG/5ML PO SUER: 5.0000 mL | Freq: Two times a day (BID) | ORAL | Status: DC | PRN

## 2016-03-10 MED ORDER — INSULIN ASPART 100 UNIT/ML ~~LOC~~ SOLN
0.0000 [IU] | Freq: Three times a day (TID) | SUBCUTANEOUS | Status: DC
  Administered 2016-03-11: 1 [IU] via SUBCUTANEOUS

## 2016-03-10 SURGICAL SUPPLY — 69 items
BANDAGE ELASTIC 4 VELCRO ST LF (GAUZE/BANDAGES/DRESSINGS) IMPLANT
BANDAGE ESMARK 6X9 LF (GAUZE/BANDAGES/DRESSINGS) IMPLANT
BLADE CLIPPER SURG (BLADE) ×2 IMPLANT
BNDG CMPR 9X4 STRL LF SNTH (GAUZE/BANDAGES/DRESSINGS) ×1
BNDG CMPR 9X6 STRL LF SNTH (GAUZE/BANDAGES/DRESSINGS)
BNDG ESMARK 4X9 LF (GAUZE/BANDAGES/DRESSINGS) ×2 IMPLANT
BNDG ESMARK 6X9 LF (GAUZE/BANDAGES/DRESSINGS)
CANISTER SUCTION 2500CC (MISCELLANEOUS) ×3 IMPLANT
CANNULA VESSEL 3MM 2 BLNT TIP (CANNULA) ×2 IMPLANT
CATH EMB 3FR 40CM (CATHETERS) ×2 IMPLANT
CLIP TI MEDIUM 24 (CLIP) ×5 IMPLANT
CLIP TI WIDE RED SMALL 24 (CLIP) ×5 IMPLANT
CUFF TOURNIQUET SINGLE 24IN (TOURNIQUET CUFF) IMPLANT
CUFF TOURNIQUET SINGLE 34IN LL (TOURNIQUET CUFF) IMPLANT
CUFF TOURNIQUET SINGLE 44IN (TOURNIQUET CUFF) IMPLANT
DRAIN CHANNEL 15F RND FF W/TCR (WOUND CARE) ×2 IMPLANT
DRAPE U-SHAPE 47X51 STRL (DRAPES) ×2 IMPLANT
DRAPE X-RAY CASS 24X20 (DRAPES) IMPLANT
DRSG COVADERM 4X10 (GAUZE/BANDAGES/DRESSINGS) IMPLANT
DRSG COVADERM 4X8 (GAUZE/BANDAGES/DRESSINGS) IMPLANT
ELECT REM PT RETURN 9FT ADLT (ELECTROSURGICAL) ×3
ELECTRODE REM PT RTRN 9FT ADLT (ELECTROSURGICAL) ×1 IMPLANT
EVACUATOR SILICONE 100CC (DRAIN) ×2 IMPLANT
GAUZE SPONGE 4X4 16PLY XRAY LF (GAUZE/BANDAGES/DRESSINGS) ×2 IMPLANT
GLOVE BIO SURGEON STRL SZ 6.5 (GLOVE) ×1 IMPLANT
GLOVE BIO SURGEONS STRL SZ 6.5 (GLOVE) ×1
GLOVE BIOGEL PI IND STRL 6.5 (GLOVE) IMPLANT
GLOVE BIOGEL PI IND STRL 7.0 (GLOVE) IMPLANT
GLOVE BIOGEL PI IND STRL 7.5 (GLOVE) ×1 IMPLANT
GLOVE BIOGEL PI INDICATOR 6.5 (GLOVE) ×2
GLOVE BIOGEL PI INDICATOR 7.0 (GLOVE) ×2
GLOVE BIOGEL PI INDICATOR 7.5 (GLOVE) ×4
GLOVE ECLIPSE 6.5 STRL STRAW (GLOVE) ×2 IMPLANT
GLOVE SURG SS PI 7.5 STRL IVOR (GLOVE) ×3 IMPLANT
GOWN STRL REUS W/ TWL LRG LVL3 (GOWN DISPOSABLE) ×2 IMPLANT
GOWN STRL REUS W/ TWL XL LVL3 (GOWN DISPOSABLE) ×1 IMPLANT
GOWN STRL REUS W/TWL LRG LVL3 (GOWN DISPOSABLE) ×12
GOWN STRL REUS W/TWL XL LVL3 (GOWN DISPOSABLE) ×6
HEMOSTAT SNOW SURGICEL 2X4 (HEMOSTASIS) ×4 IMPLANT
KIT BASIN OR (CUSTOM PROCEDURE TRAY) ×3 IMPLANT
KIT ROOM TURNOVER OR (KITS) ×3 IMPLANT
LIQUID BAND (GAUZE/BANDAGES/DRESSINGS) ×5 IMPLANT
LOOP VESSEL MAXI BLUE (MISCELLANEOUS) ×3 IMPLANT
LOOP VESSEL MINI RED (MISCELLANEOUS) ×2 IMPLANT
MARKER GRAFT CORONARY BYPASS (MISCELLANEOUS) IMPLANT
NS IRRIG 1000ML POUR BTL (IV SOLUTION) ×6 IMPLANT
PACK PERIPHERAL VASCULAR (CUSTOM PROCEDURE TRAY) ×3 IMPLANT
PAD ARMBOARD 7.5X6 YLW CONV (MISCELLANEOUS) ×6 IMPLANT
PADDING CAST COTTON 6X4 STRL (CAST SUPPLIES) IMPLANT
SET COLLECT BLD 21X3/4 12 (NEEDLE) IMPLANT
STOPCOCK 4 WAY LG BORE MALE ST (IV SETS) IMPLANT
SUT ETHILON 3 0 PS 1 (SUTURE) ×2 IMPLANT
SUT PROLENE 5 0 C 1 24 (SUTURE) ×5 IMPLANT
SUT PROLENE 6 0 BV (SUTURE) ×9 IMPLANT
SUT PROLENE 7 0 BV 1 (SUTURE) IMPLANT
SUT SILK 3 0 (SUTURE)
SUT SILK 3-0 18XBRD TIE 12 (SUTURE) IMPLANT
SUT VIC AB 2-0 CT1 27 (SUTURE) ×9
SUT VIC AB 2-0 CT1 TAPERPNT 27 (SUTURE) ×2 IMPLANT
SUT VIC AB 3-0 SH 27 (SUTURE) ×9
SUT VIC AB 3-0 SH 27X BRD (SUTURE) ×2 IMPLANT
SUT VIC AB 4-0 PS2 27 (SUTURE) ×2 IMPLANT
SUT VICRYL 4-0 PS2 18IN ABS (SUTURE) ×8 IMPLANT
SYR 3ML LL SCALE MARK (SYRINGE) ×2 IMPLANT
SYRINGE 20CC LL (MISCELLANEOUS) ×2 IMPLANT
TRAY FOLEY W/METER SILVER 16FR (SET/KITS/TRAYS/PACK) ×3 IMPLANT
TUBING EXTENTION W/L.L. (IV SETS) IMPLANT
UNDERPAD 30X30 INCONTINENT (UNDERPADS AND DIAPERS) ×3 IMPLANT
WATER STERILE IRR 1000ML POUR (IV SOLUTION) ×3 IMPLANT

## 2016-03-10 NOTE — Anesthesia Preprocedure Evaluation (Addendum)
Anesthesia Evaluation  Patient identified by MRN, date of birth, ID band Patient awake    Reviewed: Allergy & Precautions, NPO status , Patient's Chart, lab work & pertinent test resultsPreop documentation limited or incomplete due to emergent nature of procedure.  Airway Mallampati: II  TM Distance: >3 FB Neck ROM: Full    Dental  (+) Dental Advisory Given, Edentulous Upper, Missing   Pulmonary asthma , former smoker,    Pulmonary exam normal breath sounds clear to auscultation       Cardiovascular hypertension, Pt. on medications and Pt. on home beta blockers + CAD, + Past MI, + Cardiac Stents and + CABG  Normal cardiovascular exam Rhythm:Regular Rate:Normal  7/14: Echo Study Conclusions  - Left ventricle: The cavity size was normal. Systolicfunction was probably normal. The estimated ejectionfraction was in the range of 50% to 55%. - Left atrium: The atrium was mildly dilated. - Right ventricle: The cavity size was mildly dilated. Wallthickness was normal.   Neuro/Psych negative neurological ROS  negative psych ROS   GI/Hepatic Neg liver ROS, GERD  Medicated,  Endo/Other  diabetes, Type 2, Oral Hypoglycemic Agents  Renal/GU negative Renal ROS     Musculoskeletal  (+) Arthritis , Osteoarthritis,  Impingement syndrome of left shoulder   Abdominal   Peds  Hematology negative hematology ROS (+)   Anesthesia Other Findings Day of surgery medications reviewed with the patient.  Reproductive/Obstetrics                           Anesthesia Physical Anesthesia Plan  ASA: IV and emergent  Anesthesia Plan: General   Post-op Pain Management:    Induction: Intravenous  Airway Management Planned: Oral ETT  Additional Equipment:   Intra-op Plan:   Post-operative Plan: Extubation in OR  Informed Consent: I have reviewed the patients History and Physical, chart, labs and discussed the  procedure including the risks, benefits and alternatives for the proposed anesthesia with the patient or authorized representative who has indicated his/her understanding and acceptance.   Dental advisory given  Plan Discussed with: CRNA  Anesthesia Plan Comments: (Risks/benefits of general anesthesia discussed with patient including risk of damage to teeth, lips, gum, and tongue, nausea/vomiting, allergic reactions to medications, and the possibility of heart attack, stroke and death.  All patient questions answered.  Patient wishes to proceed.  LEFT BRACHIAL ARTERY INJURY)       Anesthesia Quick Evaluation

## 2016-03-10 NOTE — Progress Notes (Signed)
  I assisted Dr. Myra Gianotti on the completion of the operation of the brachial artery bypass grafting. I also assessed the forearm compartments both visually through the wounds, as well as with direct palpation, and both the deep tissue, the volar forearm as well as dorsal forearm compartments are all felt completely soft.  We will plan to monitor, his neurologic function [name] be questionable for the next 12-24 hours because of his nerve block, although he was able to move his hand in the recovery Center at the surgical center.

## 2016-03-10 NOTE — Progress Notes (Signed)
Discussed with nurse.   Still able to do complete motor exam without pain.  No change in mild numbness dorsally.  Pulses intact.   Will check in am unless status changes overnight.   Eulas Post, MD

## 2016-03-10 NOTE — Transfer of Care (Signed)
Immediate Anesthesia Transfer of Care Note  Patient: [name]  Procedure(s) Performed: Procedure(s): Left BRACHIAL To Radial ARTERY Bypass using Left Saphenous Vein (Left)  Patient Location: PACU  Anesthesia Type:General  Level of Consciousness: awake  Airway & Oxygen Therapy: Patient Spontanous Breathing and Patient connected to face mask oxygen  Post-op Assessment: Report given to RN and Post -op Vital signs reviewed and stable  Post vital signs: Reviewed and stable  Last Vitals: There were no vitals filed for this visit.  Last Pain: There were no vitals filed for this visit.       Complications: No apparent anesthesia complications

## 2016-03-10 NOTE — Progress Notes (Signed)
Examined patient in PACU with Dr. Myra Gianotti.    Excellent radial pulse.  All fingers flex extend and abduct.  Compartments are soft.  Mild numbness on dorsum of hand, otherwise intact on volar and ulnar aspect.    Plan for monitor for compartment syndrome by ICU staff, recognizing some sensory loss Mcmullan be due to the block.  Primarily monitor for pain with passive or active motion of fingers as first sign for developing compartment syndrome.  Eulas Post, MD

## 2016-03-10 NOTE — Anesthesia Postprocedure Evaluation (Signed)
Anesthesia Post Note  Patient: [name]  Procedure(s) Performed: Procedure(s) (LRB): Left BRACHIAL To Radial ARTERY Bypass using Left Saphenous Vein (Left)  Patient location during evaluation: PACU Anesthesia Type: General Level of consciousness: awake and alert and patient cooperative Pain management: pain level controlled Vital Signs Assessment: post-procedure vital signs reviewed and stable Respiratory status: spontaneous breathing and respiratory function stable Cardiovascular status: stable Anesthetic complications: no    Last Vitals:  Vitals:   03/10/16 1935 03/10/16 1945  BP: 115/90   Pulse: 94 93  Resp: (!) 22 20  Temp:      Last Pain: There were no vitals filed for this visit.               Ignazio Kincaid S

## 2016-03-10 NOTE — Anesthesia Procedure Notes (Signed)
Procedure Name: Intubation Date/Time: 03/10/2016 3:45 PM Performed by: Dairl Ponder Pre-anesthesia Checklist: Patient identified, Emergency Drugs available, Suction available, Patient being monitored and Timeout performed Patient Re-evaluated:Patient Re-evaluated prior to inductionOxygen Delivery Method: Circle system utilized Preoxygenation: Pre-oxygenation with 100% oxygen Intubation Type: IV induction, Rapid sequence and Cricoid Pressure applied Laryngoscope Size: Mac and 3 Grade View: Grade I Tube type: Oral Tube size: 7.5 mm Number of attempts: 1 Airway Equipment and Method: Stylet Placement Confirmation: ETT inserted through vocal cords under direct vision,  positive ETCO2 and breath sounds checked- equal and bilateral Secured at: 23 cm Tube secured with: Tape Dental Injury: Teeth and Oropharynx as per pre-operative assessment

## 2016-03-11 ENCOUNTER — Telehealth: Payer: Self-pay | Admitting: Surgery

## 2016-03-11 ENCOUNTER — Encounter (HOSPITAL_COMMUNITY): Payer: Self-pay | Admitting: Surgery

## 2016-03-11 DIAGNOSIS — Z951 Presence of aortocoronary bypass graft: Secondary | ICD-10-CM | POA: Diagnosis not present

## 2016-03-11 DIAGNOSIS — Z79899 Other long term (current) drug therapy: Secondary | ICD-10-CM | POA: Diagnosis not present

## 2016-03-11 DIAGNOSIS — Z981 Arthrodesis status: Secondary | ICD-10-CM | POA: Diagnosis not present

## 2016-03-11 DIAGNOSIS — E785 Hyperlipidemia, unspecified: Secondary | ICD-10-CM | POA: Diagnosis not present

## 2016-03-11 DIAGNOSIS — E119 Type 2 diabetes mellitus without complications: Secondary | ICD-10-CM | POA: Diagnosis not present

## 2016-03-11 DIAGNOSIS — Z87891 Personal history of nicotine dependence: Secondary | ICD-10-CM | POA: Diagnosis not present

## 2016-03-11 DIAGNOSIS — J45909 Unspecified asthma, uncomplicated: Secondary | ICD-10-CM | POA: Diagnosis not present

## 2016-03-11 DIAGNOSIS — J42 Unspecified chronic bronchitis: Secondary | ICD-10-CM | POA: Diagnosis not present

## 2016-03-11 DIAGNOSIS — S45119A Laceration of brachial artery, unspecified side, initial encounter: Secondary | ICD-10-CM

## 2016-03-11 DIAGNOSIS — Z7982 Long term (current) use of aspirin: Secondary | ICD-10-CM | POA: Diagnosis not present

## 2016-03-11 DIAGNOSIS — I252 Old myocardial infarction: Secondary | ICD-10-CM | POA: Diagnosis not present

## 2016-03-11 DIAGNOSIS — I9751 Accidental puncture and laceration of a circulatory system organ or structure during a circulatory system procedure: Secondary | ICD-10-CM | POA: Diagnosis not present

## 2016-03-11 DIAGNOSIS — I10 Essential (primary) hypertension: Secondary | ICD-10-CM | POA: Diagnosis not present

## 2016-03-11 DIAGNOSIS — S45112A Laceration of brachial artery, left side, initial encounter: Secondary | ICD-10-CM | POA: Diagnosis not present

## 2016-03-11 DIAGNOSIS — I2510 Atherosclerotic heart disease of native coronary artery without angina pectoris: Secondary | ICD-10-CM | POA: Diagnosis not present

## 2016-03-11 DIAGNOSIS — Z955 Presence of coronary angioplasty implant and graft: Secondary | ICD-10-CM | POA: Diagnosis not present

## 2016-03-11 DIAGNOSIS — K219 Gastro-esophageal reflux disease without esophagitis: Secondary | ICD-10-CM | POA: Diagnosis not present

## 2016-03-11 DIAGNOSIS — Z7984 Long term (current) use of oral hypoglycemic drugs: Secondary | ICD-10-CM | POA: Diagnosis not present

## 2016-03-11 HISTORY — DX: Laceration of brachial artery, unspecified side, initial encounter: S45.119A

## 2016-03-11 LAB — GLUCOSE, CAPILLARY
Glucose-Capillary: 102 mg/dL — ABNORMAL HIGH (ref 65–99)
Glucose-Capillary: 88 mg/dL (ref 65–99)
Glucose-Capillary: 128 mg/dL — ABNORMAL HIGH (ref 65–99)
Glucose-Capillary: 127 mg/dL — ABNORMAL HIGH (ref 65–99)

## 2016-03-11 MED ORDER — MORPHINE SULFATE (PF) 2 MG/ML IV SOLN
2.0000 mg | INTRAVENOUS | Status: DC | PRN
  Administered 2016-03-11 (×3): 2 mg via INTRAVENOUS
  Administered 2016-03-11: 4 mg via INTRAVENOUS
  Administered 2016-03-11 – 2016-03-12 (×2): 2 mg via INTRAVENOUS
  Filled 2016-03-11: qty 2
  Filled 2016-03-11 (×5): qty 1

## 2016-03-11 MED ORDER — METOPROLOL SUCCINATE ER 25 MG PO TB24
25.0000 mg | ORAL_TABLET | Freq: Every day | ORAL | Status: DC
  Administered 2016-03-12: 25 mg via ORAL
  Filled 2016-03-11: qty 1

## 2016-03-11 MED ORDER — FLUTICASONE PROPIONATE 50 MCG/ACT NA SUSP
2.0000 | Freq: Every day | NASAL | Status: DC | PRN
  Filled 2016-03-11: qty 16

## 2016-03-11 NOTE — Telephone Encounter (Signed)
Sched appt 8/14 at 11:15. Spoke to pt's wife to inform them of appt.

## 2016-03-11 NOTE — Care Management Note (Signed)
Case Management Note  Patient Details  Name: [name] MRN: [id number] Date of Birth: 1956-03-21  Subjective/Objective:         Pt admitted for brachial artery laceration - underwent bypass graft              Action/Plan:  PTA independent from home with wife.  CM will continue to follow for discharge needs   Expected Discharge Date:                  Expected Discharge Plan:  Home/Self Care  In-House Referral:     Discharge planning Services  CM Consult  Post Acute Care Choice:    Choice offered to:     DME Arranged:    DME Agency:     HH Arranged:    HH Agency:     Status of Service:  In process, will continue to follow  If discussed at Long Length of Stay Meetings, dates discussed:    Additional Comments:  Cherylann Parr, RN 03/11/2016, 2:40 PM

## 2016-03-11 NOTE — Progress Notes (Signed)
Patient has been transferred to [name], with monitor on by wheelchair and with his family also. Vital signs were stable. Report was given to Boneta Lucks, [place]. Patient was excited to go [place].

## 2016-03-11 NOTE — Progress Notes (Signed)
[facility]  Subjective  - pain is an issue.     Objective (!) 98/51 64 97.4 F (36.3 C) (Oral) 14 97%  Intake/Output Summary (Last 24 hours) at 03/11/16 0748 Last data filed at 03/11/16 0700  Gross per 24 hour  Intake             2605 ml  Output             2590 ml  Net               15 ml    40 cc JP out put Grip 4+/5 sensation grossly in take and equal bilaterally Left arm incisions clean and dry   Assessment/Planning: POD # 1 Repair brachial artery with left GSV harvest  D/C verses transfer to 2W  [name] [facility] 03/11/2016 7:48 AM --  Laboratory Lab Results:  Recent Labs  03/10/16 2255  WBC 9.6  HGB 12.6*  HCT 38.8*  PLT 162   BMET  Recent Labs  03/10/16 2255  NA 135  K 4.2  CL 101  CO2 28  GLUCOSE 102*  BUN 13  CREATININE 0.89  CALCIUM 8.3*    COAG Lab Results  Component Value Date   INR 0.95 03/31/2014   INR 0.95 03/20/2014   INR 0.95 12/17/2013   No results found for: PTT

## 2016-03-11 NOTE — Progress Notes (Signed)
     Subjective:  Patient reports pain as mild.  Overall doing well.  Wants to go home.    Objective:   VITALS:   Vitals:   03/11/16 1000 03/11/16 1100 03/11/16 1148 03/11/16 1337  BP: (!) 87/65 103/61 105/60   Pulse: 65 69    Resp: 20 14 18    Temp:  98.1 F (36.7 C) 97.7 F (36.5 C)   TempSrc:  Oral Oral   SpO2: 95% 98% 94%   Height:    5\' 8"  (1.727 m)    Drain intact.   All fingers flex extend and abduct actively.  Intact radial pulse, bounding.  Good capillary refill.   Compartments soft.  Lab Results  Component Value Date   WBC 9.6 03/10/2016   HGB 12.6 (L) 03/10/2016   HCT 38.8 (L) 03/10/2016   MCV 86.8 03/10/2016   PLT 162 03/10/2016   BMET    Component Value Date/Time   NA 135 03/10/2016 2255   NA 140 02/19/2013 1510   K 4.2 03/10/2016 2255   K 4.0 02/19/2013 1510   CL 101 03/10/2016 2255   CL 106 02/19/2013 1510   CO2 28 03/10/2016 2255   CO2 28 02/19/2013 1510   GLUCOSE 102 (H) 03/10/2016 2255   GLUCOSE 79 02/19/2013 1510   BUN 13 03/10/2016 2255   BUN 16 02/19/2013 1510   CREATININE 0.89 03/10/2016 2255   CREATININE 0.90 02/19/2013 1510   CALCIUM 8.3 (L) 03/10/2016 2255   CALCIUM 8.9 02/19/2013 1510   GFRNONAA >60 03/10/2016 2255   GFRNONAA >60 02/19/2013 1510   GFRAA >60 03/10/2016 2255   GFRAA >60 02/19/2013 1510     Assessment/Plan: 1 Day Post-Op   Active Problems:   Brachial artery laceration   Advance diet Discharge home per vascular team, once drain is removed and arm is optimized.     Eulas Post 8/4/2017Teryl Lucy, MD Cell [phone]

## 2016-03-11 NOTE — Op Note (Signed)
Patient name: [name] MRN: [id number] DOB: 1956/08/01 Sex: male  03/10/2016 Pre-operative Diagnosis: Left brachial artery transection Post-operative diagnosis:  Same Surgeon:  Durene Cal Assistants:  Lianne Cure Procedure:   Left brachial to radial artery bypass graft with ipsilateral non-reversed greater saphenous vein Anesthesia:  Gen. Blood Loss:  See anesthesia record Specimens:  None  Findings:  Significant scar tissue within the antecubital incision making identification of the anatomy very difficult.  I ended up proceeding with bypass from the brachial artery to the radial artery for a distance of approximate 5 cm.  What appeared to be a interosseous artery was ligated.  The patient did have a Doppler signal in his ulnar artery during the procedure.  After the bypass, he had a palpable radial pulse and a much improved Doppler signal in his ulnar artery as well as a multiphasic signal in the palmar arch  Indications:  The patient was transported from the surgical center emergently to the operating room and [facility].  He is undergoing redo dissection tendon repair and the brachial artery was transected.  Procedure:  The patient was identified in the holding area and taken to [facility] OR ROOM 11  The patient was then placed supine on the table. general anesthesia was administered.  The patient was prepped and draped in the usual sterile fashion.  A time out was called and antibiotics were administered.  The patient's earlier incision was opened by removing the nylon suture.  Retractors were then placed.  I was able to identify the brachial artery which had a clip placed on it.  There was a large branch that came off anteriorly.  I then explored the distal aspect of the incision and identified what appeared to be the other distal part of the brachial artery, however it was going in a direction that was more consistent with the interosseous artery.  What appeared to be the radial artery  was also identified.  There were multiple bleeding venous branches which were controlled with clips.  I did not feel that I could do a bypass to the interosseous artery from the current location, and therefore I elected to make a second longitudinal incision.  I looked for the radial artery with ultrasound and it appeared to be disease free.  I therefore made a longitudinal incision over the radial artery.  This was dissected free was about a 2 mm disease-free artery.  I felt this would be the best distal target as the patient had a Doppler signal in his ulnar artery and I was unsure what the distal part of the artery which had been ligated represented.    3 left groin incision the left saphenous vein was harvested.  I dissected up to the saphenofemoral junction and then distally for approximately 10 cm, ligating side branches between metal clips and silk ties.  The vein was then removed by ligating it proximally and distally with 2-0 silk ties.  It was prepared on the back table.  It distended very nicely measuring approximately 6-7 mm.  At this point, the patient was fully heparinized.  I transected approximate 1 cm of the brachial artery to get to a healthy-appearing artery.  A end to end anastomosis was created with running 6-0 Prolene.  The vein was placed in a non-reversed fashion.  Once anastomosis was completed a [employer] valvulotome was used to lyse the valves and then there was excellent pulsatile flow through the graft.  I made sure that it maintain  proper orientation and brought through the subcutaneous tissue position over the radial artery.  Radial artery was occluded with Serafin clamps and a #11 blade was used to make an arteriotomy which was extended longitudinally with Potts scissors.  The vein graft was cut the appropriate length and spatulated to fit the size of the arteriotomy.  A running anastomosis was created with 6-0 Prolene.  Prior to completion the appropriate flushing maneuvers were  performed.  I did pass a #3 Fogarty catheter up the radial artery and brachial artery to make sure there was no residual thrombus.  Once anastomosis was completed patient had excellent palpable radial pulse.  There is also a multiphasic ulnar artery signal and palmar arch signal.  I reversed the heparin with 50 mg protamine.  A monitor for hemostasis.  There was some oozing from some of the muscle bellies and I felt that leaving a drain was appropriate therefore a 15 Blake drain was brought out through a stab incision and sutured in position.  Was then irrigated.  Incisions were closed with 2 layers of 3-0 Vicryl.  Sterile dressings were applied.  There were no immediate complications.   Disposition:  To PACU in stable condition.   Juleen China, M.D. [facility] [place] Office: [phone] Pager:  [phone]

## 2016-03-11 NOTE — Telephone Encounter (Signed)
-----   Message from Sharee Pimple, RN sent at 03/11/2016  9:26 AM EDT ----- Regarding: 2 weeks postop    ----- Message ----- From: Dara Lords, PA-C Sent: 03/11/2016   9:06 AM To: Vvs Charge Pool  S/p Repair brachial artery with left GSV harvest 03/10/16.  F/u with Dr. Myra Gianotti in 2 weeks.  Thanks, Lelon Mast

## 2016-03-11 NOTE — Progress Notes (Signed)
Pt arrived to unit from 2S.  Telemetry applied and CCMD notified.  Pt and family oriented to room including call light and telephone.  Pt recovery plan discussed with Pt and family.  All parties agree with current plan.  Pt stable.  Will cont to monitor.

## 2016-03-12 DIAGNOSIS — I9751 Accidental puncture and laceration of a circulatory system organ or structure during a circulatory system procedure: Secondary | ICD-10-CM | POA: Diagnosis not present

## 2016-03-12 DIAGNOSIS — E785 Hyperlipidemia, unspecified: Secondary | ICD-10-CM | POA: Diagnosis not present

## 2016-03-12 DIAGNOSIS — Z79899 Other long term (current) drug therapy: Secondary | ICD-10-CM | POA: Diagnosis not present

## 2016-03-12 DIAGNOSIS — Z951 Presence of aortocoronary bypass graft: Secondary | ICD-10-CM | POA: Diagnosis not present

## 2016-03-12 DIAGNOSIS — Z87891 Personal history of nicotine dependence: Secondary | ICD-10-CM | POA: Diagnosis not present

## 2016-03-12 DIAGNOSIS — Z955 Presence of coronary angioplasty implant and graft: Secondary | ICD-10-CM | POA: Diagnosis not present

## 2016-03-12 DIAGNOSIS — E119 Type 2 diabetes mellitus without complications: Secondary | ICD-10-CM | POA: Diagnosis not present

## 2016-03-12 DIAGNOSIS — K219 Gastro-esophageal reflux disease without esophagitis: Secondary | ICD-10-CM | POA: Diagnosis not present

## 2016-03-12 DIAGNOSIS — Z7982 Long term (current) use of aspirin: Secondary | ICD-10-CM | POA: Diagnosis not present

## 2016-03-12 DIAGNOSIS — J45909 Unspecified asthma, uncomplicated: Secondary | ICD-10-CM | POA: Diagnosis not present

## 2016-03-12 DIAGNOSIS — I252 Old myocardial infarction: Secondary | ICD-10-CM | POA: Diagnosis not present

## 2016-03-12 DIAGNOSIS — I10 Essential (primary) hypertension: Secondary | ICD-10-CM | POA: Diagnosis not present

## 2016-03-12 DIAGNOSIS — J42 Unspecified chronic bronchitis: Secondary | ICD-10-CM | POA: Diagnosis not present

## 2016-03-12 DIAGNOSIS — Z981 Arthrodesis status: Secondary | ICD-10-CM | POA: Diagnosis not present

## 2016-03-12 DIAGNOSIS — I2510 Atherosclerotic heart disease of native coronary artery without angina pectoris: Secondary | ICD-10-CM | POA: Diagnosis not present

## 2016-03-12 DIAGNOSIS — Z7984 Long term (current) use of oral hypoglycemic drugs: Secondary | ICD-10-CM | POA: Diagnosis not present

## 2016-03-12 LAB — HEMOGLOBIN A1C
Hgb A1c MFr Bld: 6 % — ABNORMAL HIGH (ref 4.8–5.6)
Mean Plasma Glucose: 126 mg/dL

## 2016-03-12 LAB — GLUCOSE, CAPILLARY: Glucose-Capillary: 134 mg/dL — ABNORMAL HIGH (ref 65–99)

## 2016-03-12 MED ORDER — OXYCODONE-ACETAMINOPHEN 5-325 MG PO TABS: 1.0000 | ORAL_TABLET | Freq: Four times a day (QID) | ORAL | 0 refills | Status: DC | PRN

## 2016-03-12 MED ORDER — DIPHENHYDRAMINE HCL 25 MG PO CAPS: 25.0000 mg | ORAL_CAPSULE | Freq: Three times a day (TID) | ORAL | Status: AC | PRN

## 2016-03-12 MED ORDER — DIPHENHYDRAMINE HCL 25 MG PO CAPS
25.0000 mg | ORAL_CAPSULE | Freq: Three times a day (TID) | ORAL | Status: DC | PRN
  Administered 2016-03-12: 25 mg via ORAL
  Filled 2016-03-12: qty 1

## 2016-03-12 NOTE — Progress Notes (Addendum)
Progress Note 03/12/2016 8:30 AM 2 Days Post-Op  Subjective:  Wants to know if he is going home.  C/o itching.  Says he's having pain, but it is controlled with pain medicine.  Tm 99.5 HR 70's NSR 110's systolic 90's RA  Vitals:   03/11/16 1947 03/12/16 0428  BP: (!) 111/53 (!) 119/52  Pulse: 73 79  Resp: 18 18  Temp: 99.1 F (37.3 C) 99.5 F (37.5 C)   Physical Exam: Cardiac:  regular Lungs:  Non labored Incisions:  Both left arm incisions are clean and dry; left thigh incision is clean and dry Extremities:  3+ palpable left radial pulse.  4/5 LUE strength; 5/5 RUE strength  CBC    Component Value Date/Time   WBC 9.6 03/10/2016 2255   RBC 4.47 03/10/2016 2255   HGB 12.6 (L) 03/10/2016 2255   HGB 14.0 02/19/2013 1510   HCT 38.8 (L) 03/10/2016 2255   HCT 42.1 02/19/2013 1510   PLT 162 03/10/2016 2255   PLT 219 02/19/2013 1510   MCV 86.8 03/10/2016 2255   MCV 85 02/19/2013 1510   MCH 28.2 03/10/2016 2255   MCHC 32.5 03/10/2016 2255   RDW 13.6 03/10/2016 2255   RDW 13.3 02/19/2013 1510   LYMPHSABS 1.8 02/18/2016 1030   LYMPHSABS 2.4 02/19/2013 1510   MONOABS 1.2 (H) 02/18/2016 1030   MONOABS 0.5 02/19/2013 1510   EOSABS 0.1 02/18/2016 1030   EOSABS 0.4 02/19/2013 1510   BASOSABS 0.0 02/18/2016 1030   BASOSABS 0.1 02/19/2013 1510    BMET    Component Value Date/Time   NA 135 03/10/2016 2255   NA 140 02/19/2013 1510   K 4.2 03/10/2016 2255   K 4.0 02/19/2013 1510   CL 101 03/10/2016 2255   CL 106 02/19/2013 1510   CO2 28 03/10/2016 2255   CO2 28 02/19/2013 1510   GLUCOSE 102 (H) 03/10/2016 2255   GLUCOSE 79 02/19/2013 1510   BUN 13 03/10/2016 2255   BUN 16 02/19/2013 1510   CREATININE 0.89 03/10/2016 2255   CREATININE 0.90 02/19/2013 1510   CALCIUM 8.3 (L) 03/10/2016 2255   CALCIUM 8.9 02/19/2013 1510   GFRNONAA >60 03/10/2016 2255   GFRNONAA >60 02/19/2013 1510   GFRAA >60 03/10/2016 2255   GFRAA >60 02/19/2013 1510    INR    Component  Value Date/Time   INR 0.95 03/31/2014 2025     Intake/Output Summary (Last 24 hours) at 03/12/16 0830 Last data filed at 03/12/16 0805  Gross per 24 hour  Intake              600 ml  Output              875 ml  Net             -275 ml   JP drain: 30cc last shift  Assessment:  60 y.o. male is s/p:  Left brachial to radial artery bypass graft with ipsilateral non-reversed greater saphenous vein  2 Days Post-Op   Plan: -pt with patent bypass with 3+ palpable left radial pulse -DVT prophylaxis:  Lovenox -Lunn be able to dc JP drain - had 30cc out last shift.  Will d/w Dr. Edilia Bo. -itching-will order Benadryl -home once drain is able to be removed.   Doreatha Massed, PA-C [facility] [phone] 03/12/2016 8:30 AM  I have interviewed the patient and examined the patient. I agree with the findings by the PA. C/O chest pain. No pressure. Sounds musculoskeletal.  Nice left radial pulse.  Incisions fine D/C JP Home today.  Cari Caraway, MD [phone]

## 2016-03-12 NOTE — Progress Notes (Signed)
   Assessment: 2 Days Post-Op  S/P Procedure(s) (LRB): Left BRACHIAL To Radial ARTERY Bypass using Left Saphenous Vein (Left)  Active Problems:   Brachial artery laceration   Plan: Discharging home per Vascular team after JP removal. Follow up with Dr. Dion Saucier in the office as scheduled.  Subjective: Patient reports pain as mild.  He wants to go home. Pain controlled with PO meds.  Tolerating diet.  Urinating.   Anterior chest wall soreness since yesterday afternoon.  Tender to palpation and with movement of arm - not exertional, positional.  No SOB.  Objective:   VITALS:   Vitals:   03/11/16 1148 03/11/16 1337 03/11/16 1947 03/12/16 0428  BP: 105/60  (!) 111/53 (!) 119/52  Pulse:   73 79  Resp: 18  18 18   Temp: 97.7 F (36.5 C)  99.1 F (37.3 C) 99.5 F (37.5 C)  TempSrc: Oral  Oral Oral  SpO2: 94%   (!) 83%  Height:  5\' 8"  (1.727 m)      Lab Results  Component Value Date   WBC 9.6 03/10/2016   HGB 12.6 (L) 03/10/2016   HCT 38.8 (L) 03/10/2016   MCV 86.8 03/10/2016   PLT 162 03/10/2016    Physical Exam General: NAD.  Sitting upright in chair Neuro: Pleasant, conversant.  No gross focal deficit. JP drain in place.  Scant bloody / SS drainage. Incisions c/d/i w/ surgical glue  MSK Full Painless active and passive ROM of all fingers and wrist.   Strong Radial pulse.  Good capillary refill. Compartments soft. Sensation intact distally Soreness to palpation left anterior chest wall.  [name] 03/12/2016, 9:38 AM

## 2016-03-12 NOTE — Progress Notes (Signed)
Discussed with the patient and all questioned fully answered. He will call me if any problems arise.  Given paper prescription for percocet and benadryl. Instructed on s/s of infection and when to call MD.   IV removed. JP drain removed. Telemetry removed, CCMD notified.   Leonidas Romberg, RN

## 2016-03-13 LAB — GLUCOSE, CAPILLARY: Glucose-Capillary: 111 mg/dL — ABNORMAL HIGH (ref 65–99)

## 2016-03-16 ENCOUNTER — Telehealth: Payer: Self-pay

## 2016-03-16 DIAGNOSIS — M66822 Spontaneous rupture of other tendons, left upper arm: Secondary | ICD-10-CM | POA: Diagnosis not present

## 2016-03-16 NOTE — Telephone Encounter (Signed)
Message left for the patient to call back to schedule his surgery with Dr Evette Cristal.

## 2016-03-17 ENCOUNTER — Encounter: Payer: Self-pay | Admitting: Surgery

## 2016-03-18 DIAGNOSIS — Z01810 Encounter for preprocedural cardiovascular examination: Secondary | ICD-10-CM | POA: Diagnosis not present

## 2016-03-18 DIAGNOSIS — I2510 Atherosclerotic heart disease of native coronary artery without angina pectoris: Secondary | ICD-10-CM | POA: Diagnosis not present

## 2016-03-21 ENCOUNTER — Ambulatory Visit (INDEPENDENT_AMBULATORY_CARE_PROVIDER_SITE_OTHER): Payer: Self-pay | Admitting: Surgery

## 2016-03-21 ENCOUNTER — Encounter: Payer: Self-pay | Admitting: Surgery

## 2016-03-21 VITALS — BP 111/64 | HR 66 | Temp 97.1°F | Ht 68.0 in | Wt 194.5 lb

## 2016-03-21 DIAGNOSIS — S45102A Unspecified injury of brachial artery, left side, initial encounter: Secondary | ICD-10-CM

## 2016-03-21 DIAGNOSIS — G8918 Other acute postprocedural pain: Secondary | ICD-10-CM

## 2016-03-21 MED ORDER — OXYCODONE-ACETAMINOPHEN 5-325 MG PO TABS: 2.0000 | ORAL_TABLET | Freq: Four times a day (QID) | ORAL | 0 refills | Status: AC | PRN

## 2016-03-21 NOTE — Progress Notes (Signed)
Patient name: [name] MRN: [id number] DOB: 05-26-1956 Sex: male  REASON FOR VISIT: post-op  HPI: [name] is a 60 y.o. male who is here for his first postoperative visit.  On 03/10/2016 he underwent a left brachial to left radial artery bypass graft with ipsilateral non-reversed greater saphenous vein.  He suffered a brachial artery transection during a outpatient redo bicep tendon repair.  [facility] were [facility] the above procedure.  He is in the hospital for 2 days postoperatively.  He is back for follow-up  The patient complains of numbness along the medial aspect of his arm.  He also complains of pain in his vein harvest site in the left groin.  Current Outpatient Prescriptions  Medication Sig Dispense Refill  . acetaminophen (TYLENOL) 325 MG tablet Take 2 tablets (650 mg total) by mouth every 6 (six) hours as needed for mild pain (or Fever >/= 101). 30 tablet 0  . albuterol (PROVENTIL HFA) 108 (90 Base) MCG/ACT inhaler Inhale 2 puffs into the lungs every 6 (six) hours as needed for wheezing or shortness of breath. 1 Inhaler prn  . albuterol (PROVENTIL) (2.5 MG/3ML) 0.083% nebulizer solution Take 2.5 mg by nebulization every 6 (six) hours as needed for wheezing or shortness of breath.    Marland Kitchen aspirin EC 81 MG tablet Take 81 mg by mouth daily.    . chlorpheniramine-HYDROcodone (TUSSIONEX PENNKINETIC ER) 10-8 MG/5ML SUER Take 5 mLs by mouth every 12 (twelve) hours as needed for cough. 140 mL 0  . diphenhydrAMINE (BENADRYL) 25 mg capsule Take 1 capsule (25 mg total) by mouth every 8 (eight) hours as needed for itching.    . docusate sodium (COLACE) 100 MG capsule Take 1 capsule (100 mg total) by mouth 2 (two) times daily. 60 capsule 2  . EPINEPHrine (EPIPEN 2-PAK) 0.3 mg/0.3 mL IJ SOAJ injection Inject 0.3 mg into the muscle as needed (for allergic reaction).     Marland Kitchen esomeprazole (NEXIUM) 40 MG capsule Take 1  capsule (40 mg total) by mouth daily. 30 capsule 12  . ezetimibe-simvastatin (VYTORIN) 10-10 MG per tablet Take 1 tablet by mouth daily.     . fluticasone (FLONASE) 50 MCG/ACT nasal spray Place 2 sprays into both nostrils daily as needed for allergies.     Marland Kitchen gabapentin (NEURONTIN) 100 MG capsule Take 100 mg by mouth at bedtime.    Marland Kitchen ibuprofen (ADVIL,MOTRIN) 400 MG tablet Take 1 tablet (400 mg total) by mouth every 6 (six) hours as needed. (Patient taking differently: Take 400 mg by mouth every 6 (six) hours as needed for moderate pain. ) 30 tablet 0  . ipratropium (ATROVENT) 0.02 % nebulizer solution Take 0.5 mg by nebulization every 6 (six) hours as needed for wheezing or shortness of breath.    . lisinopril (PRINIVIL,ZESTRIL) 5 MG tablet Take 5 mg by mouth daily.   0  . loratadine (CLARITIN) 10 MG tablet Take 1 tablet (10 mg total) by mouth daily. 30 tablet 12  . magnesium oxide (MAG-OX) 400 MG tablet Take 800 mg by mouth daily.     . metFORMIN (GLUCOPHAGE) 500 MG tablet Take 1 tablet by mouth 2 (two) times daily.  2  . metoprolol succinate (TOPROL-XL) 25 MG 24 hr tablet Take 25 mg by mouth daily.     . metroNIDAZOLE (FLAGYL) 500 MG tablet Take 1 tablet (500 mg total) by mouth 3 (three) times daily. 14 tablet 0  . nitroGLYCERIN (NITROSTAT)  0.4 MG SL tablet Place 0.4 mg under the tongue every 5 (five) minutes as needed for chest pain.     Marland Kitchen. ondansetron (ZOFRAN ODT) 4 MG disintegrating tablet Take 1 tablet (4 mg total) by mouth every 6 (six) hours as needed for nausea or vomiting. 20 tablet 0  . oxyCODONE-acetaminophen (PERCOCET/ROXICET) 5-325 MG tablet Take 2 tablets by mouth every 6 (six) hours as needed for moderate pain. 20 tablet 0  . Testosterone (ANDROGEL) 20.25 MG/1.25GM (1.62%) GEL Place 1-2 application onto the skin daily. Apply 1-2 squirts to each shoulder    . traZODone (DESYREL) 150 MG tablet Take 150 mg by mouth at bedtime.     No current facility-administered medications for this  visit.     REVIEW OF SYSTEMS:  [X]  denotes positive finding, [ ]  denotes negative finding Cardiac  Comments:  Chest pain or chest pressure:    Shortness of breath upon exertion:    Short of breath when lying flat:    Irregular heart rhythm:    Constitutional    Fever or chills:      PHYSICAL EXAM: Vitals:   03/21/16 1125  BP: 111/64  Pulse: 66  Temp: 97.1 F (36.2 C)  TempSrc: Oral  SpO2: 97%  Weight: 194 lb 8 oz (88.2 kg)  Height: 5\' 8"  (1.727 m)    GENERAL: The patient is a well-nourished male, in no acute distress. The vital signs are documented above. CARDIOVASCULAR: There is a regular rate and rhythm. PULMONARY: There is good air exchange bilaterally without wheezing or rales. Numbness along the medial side of his left arm.  His incisions are intact.  He has a palpable radial pulse and palpable graft pulse.  His whole arm is very tender.  He has good grip strength in the left hand and sensation in the hand is intact.  MEDICAL ISSUES: Stable from my perspective.  He is scheduled to see Dr. Dion SaucierLandau next week.  I did give him 30 Percocet tablets for pain today.  I am scheduling him for a ultrasound in 3 months for bypass graft surveillance.  Durene CalWells Brabham, MD [facility] Tel [phone] Pager [phone]

## 2016-03-22 NOTE — Discharge Summary (Signed)
Discharge Summary    Ellery PlunkRalph E Consalvo 07-Nov-1955 60 y.o. male  [id number]  Admission Date: 03/10/2016  Discharge Date: 03/12/16  Physician: No att. providers found  Admission Diagnosis: Brachial Artery Injry    HPI:   This is a 60 y.o. male who is here for his first postoperative visit.  On 03/10/2016 he underwent a left brachial to left radial artery bypass graft with ipsilateral non-reversed greater saphenous vein.  He suffered a brachial artery transection during a outpatient redo bicep tendon repair.  [facility] were [facility] underwent the above procedure.  He is in the hospital for 2 days postoperatively.  He is back for follow-up  The patient complains of numbness along the medial aspect of his arm.  He also complains of pain in his vein harvest site in the left groin.  Hospital Course:  The patient was admitted to the hospital and taken to the operating room on 03/10/2016 and underwent:  Left brachial to radial artery bypass graft with ipsilateral non-reversed greater saphenous vein    The pt tolerated the procedure well and was transported to the PACU in good condition.   By POD 2, his JP drains were removed.  He was having some itching from the narcotic and was given Benadryl.  He has an easily palpable left radial pulse.  He was discharge home on this day.    Ortho was also following pt.  His compartments are soft.  His sensation is in tact distally.  They are also aware of his soreness to palpation left anterior chest wall.    The remainder of the hospital course consisted of increasing mobilization and increasing intake of solids without difficulty.  CBC    Component Value Date/Time   WBC 9.6 03/10/2016 2255   RBC 4.47 03/10/2016 2255   HGB 12.6 (L) 03/10/2016 2255   HGB 14.0 02/19/2013 1510   HCT 38.8 (L) 03/10/2016 2255   HCT 42.1 02/19/2013 1510   PLT 162 03/10/2016 2255   PLT 219 02/19/2013 1510   MCV 86.8 03/10/2016 2255   MCV 85 02/19/2013 1510   MCH 28.2 03/10/2016 2255   MCHC 32.5 03/10/2016 2255   RDW 13.6 03/10/2016 2255   RDW 13.3 02/19/2013 1510   LYMPHSABS 1.8 02/18/2016 1030   LYMPHSABS 2.4 02/19/2013 1510   MONOABS 1.2 (H) 02/18/2016 1030   MONOABS 0.5 02/19/2013 1510   EOSABS 0.1 02/18/2016 1030   EOSABS 0.4 02/19/2013 1510   BASOSABS 0.0 02/18/2016 1030   BASOSABS 0.1 02/19/2013 1510    BMET    Component Value Date/Time   NA 135 03/10/2016 2255   NA 140 02/19/2013 1510   K 4.2 03/10/2016 2255   K 4.0 02/19/2013 1510   CL 101 03/10/2016 2255   CL 106 02/19/2013 1510   CO2 28 03/10/2016 2255   CO2 28 02/19/2013 1510   GLUCOSE 102 (H) 03/10/2016 2255   GLUCOSE 79 02/19/2013 1510   BUN 13 03/10/2016 2255   BUN 16 02/19/2013 1510   CREATININE 0.89 03/10/2016 2255   CREATININE 0.90 02/19/2013 1510   CALCIUM 8.3 (L) 03/10/2016 2255   CALCIUM 8.9 02/19/2013 1510   GFRNONAA >60 03/10/2016 2255   GFRNONAA >60 02/19/2013 1510   GFRAA >60 03/10/2016 2255   GFRAA >60 02/19/2013 1510      Discharge Instructions    Call MD for:  redness, tenderness, or signs of infection (pain, swelling, bleeding, redness, odor or green/yellow discharge around incision  site)    Complete by:  As directed   Call MD for:  severe or increased pain, loss or decreased feeling  in affected limb(s)    Complete by:  As directed   Call MD for:  temperature >100.5    Complete by:  As directed   Discharge wound care:    Complete by:  As directed   Shower daily with soap and water starting 03/13/16   Driving Restrictions    Complete by:  As directed   No driving for 2 weeks & while taking pain medication.   Lifting restrictions    Complete by:  As directed   No heavy lifting until returning to see Dr. Myra Gianotti and Dr. Dion Saucier.   Resume previous diet    Complete by:  As directed      Discharge Diagnosis:  Brachial Artery Injry   Secondary Diagnosis: Patient Active Problem List   Diagnosis Date Noted  . Brachial  artery laceration 03/10/2016  . Diverticulitis 02/18/2016  . Impingement syndrome of left shoulder 07/25/2014  . Arthritis of left acromioclavicular joint 07/25/2014  . Mild intermittent asthmatic bronchitis without complication 07/18/2014  . Insomnia 03/16/2014  . Obesity (BMI 30-39.9)   . Hypertension 02/23/2013  . Seasonal and perennial allergic rhinitis 08/27/2007  . G E R D 08/27/2007  . Hyperlipidemia   . CAD native vessel and bypass graft    Past Medical History:  Diagnosis Date  . Allergic rhinitis   . Arthritis   . Arthritis of left acromioclavicular joint 07/25/2014  . Asthma   . Chronic bronchitis (HCC)   . Colon polyp   . Coronary artery disease   . Cramps, muscle, general   . Diabetes mellitus without complication (HCC)   . Diverticula of colon   . GERD (gastroesophageal reflux disease)   . Hypercholesteremia   . Hypertension   . Impingement syndrome of left shoulder 07/25/2014  . Laceration of brachial artery 03/11/2016   left arm  . Leg cramps   . Lumbar disc disease   . Myocardial infarction [facility])        Medication List    TAKE these medications   acetaminophen 325 MG tablet Commonly known as:  TYLENOL Take 2 tablets (650 mg total) by mouth every 6 (six) hours as needed for mild pain (or Fever >/= 101).   albuterol (2.5 MG/3ML) 0.083% nebulizer solution Commonly known as:  PROVENTIL Take 2.5 mg by nebulization every 6 (six) hours as needed for wheezing or shortness of breath.   albuterol 108 (90 Base) MCG/ACT inhaler Commonly known as:  PROVENTIL HFA Inhale 2 puffs into the lungs every 6 (six) hours as needed for wheezing or shortness of breath.   ANDROGEL 20.25 MG/1.25GM (1.62%) Gel Generic drug:  Testosterone Place 1-2 application onto the skin daily. Apply 1-2 squirts to each shoulder   aspirin EC 81 MG tablet Take 81 mg by mouth daily.   chlorpheniramine-HYDROcodone 10-8 MG/5ML Suer Commonly known as:  TUSSIONEX PENNKINETIC ER Take 5  mLs by mouth every 12 (twelve) hours as needed for cough.   diphenhydrAMINE 25 mg capsule Commonly known as:  BENADRYL Take 1 capsule (25 mg total) by mouth every 8 (eight) hours as needed for itching.   docusate sodium 100 MG capsule Commonly known as:  COLACE Take 1 capsule (100 mg total) by mouth 2 (two) times daily.   EPIPEN 2-PAK 0.3 mg/0.3 mL Soaj injection Generic drug:  EPINEPHrine Inject 0.3 mg into the muscle as needed (  for allergic reaction).   esomeprazole 40 MG capsule Commonly known as:  NEXIUM Take 1 capsule (40 mg total) by mouth daily.   fluticasone 50 MCG/ACT nasal spray Commonly known as:  FLONASE Place 2 sprays into both nostrils daily as needed for allergies.   gabapentin 100 MG capsule Commonly known as:  NEURONTIN Take 100 mg by mouth at bedtime.   ibuprofen 400 MG tablet Commonly known as:  ADVIL,MOTRIN Take 1 tablet (400 mg total) by mouth every 6 (six) hours as needed. What changed:  reasons to take this   ipratropium 0.02 % nebulizer solution Commonly known as:  ATROVENT Take 0.5 mg by nebulization every 6 (six) hours as needed for wheezing or shortness of breath.   lisinopril 5 MG tablet Commonly known as:  PRINIVIL,ZESTRIL Take 5 mg by mouth daily.   loratadine 10 MG tablet Commonly known as:  CLARITIN Take 1 tablet (10 mg total) by mouth daily.   magnesium oxide 400 MG tablet Commonly known as:  MAG-OX Take 800 mg by mouth daily.   metFORMIN 500 MG tablet Commonly known as:  GLUCOPHAGE Take 1 tablet by mouth 2 (two) times daily.   metoprolol succinate 25 MG 24 hr tablet Commonly known as:  TOPROL-XL Take 25 mg by mouth daily.   metroNIDAZOLE 500 MG tablet Commonly known as:  FLAGYL Take 1 tablet (500 mg total) by mouth 3 (three) times daily.   nitroGLYCERIN 0.4 MG SL tablet Commonly known as:  NITROSTAT Place 0.4 mg under the tongue every 5 (five) minutes as needed for chest pain.   ondansetron 4 MG disintegrating  tablet Commonly known as:  ZOFRAN ODT Take 1 tablet (4 mg total) by mouth every 6 (six) hours as needed for nausea or vomiting.   traZODone 150 MG tablet Commonly known as:  DESYREL Take 150 mg by mouth at bedtime.   VYTORIN 10-10 MG tablet Generic drug:  ezetimibe-simvastatin Take 1 tablet by mouth daily.       Prescriptions given: Percocet  Instructions: 1.  Per ortho  Disposition: home  Patient's condition: is Good  Follow up: 1. Dr. Myra Gianotti in 2 weeks 2. Dr. Dion Saucier in 2 weeks.   Doreatha Massed, PA-C [facility] [phone] 03/22/2016  10:55 AM

## 2016-03-22 NOTE — Telephone Encounter (Signed)
I spoke with the patient's wife, Gillis Ends. He had to have emergency arterial grafting for an artery in his arm that was cut during his tendon repair surgery. He wants to wait for his surgery until he is more recovered. He will be seen here in the office by Dr Evette Cristal on 04/13/16 at 1:00 pm for a pre op exam and we will schedule his surgery at that time.

## 2016-03-30 DIAGNOSIS — M66822 Spontaneous rupture of other tendons, left upper arm: Secondary | ICD-10-CM | POA: Diagnosis not present
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
## 2016-04-06 ENCOUNTER — Other Ambulatory Visit: Payer: Self-pay | Admitting: Surgery

## 2016-04-06 DIAGNOSIS — S45119D Laceration of brachial artery, unspecified side, subsequent encounter: Secondary | ICD-10-CM

## 2016-04-06 DIAGNOSIS — I25709 Atherosclerosis of coronary artery bypass graft(s), unspecified, with unspecified angina pectoris: Secondary | ICD-10-CM

## 2016-04-13 ENCOUNTER — Ambulatory Visit: Payer: BLUE CROSS/BLUE SHIELD | Admitting: General Surgery

## 2016-04-26 DIAGNOSIS — Z23 Encounter for immunization: Secondary | ICD-10-CM | POA: Diagnosis not present

## 2016-04-27 DIAGNOSIS — M66822 Spontaneous rupture of other tendons, left upper arm: Secondary | ICD-10-CM | POA: Diagnosis not present

## 2016-05-02 ENCOUNTER — Encounter: Payer: Self-pay | Admitting: General Surgery

## 2016-05-02 ENCOUNTER — Telehealth: Payer: Self-pay

## 2016-05-02 ENCOUNTER — Ambulatory Visit (INDEPENDENT_AMBULATORY_CARE_PROVIDER_SITE_OTHER): Payer: BLUE CROSS/BLUE SHIELD | Admitting: General Surgery

## 2016-05-02 VITALS — BP 132/74 | HR 67 | Ht 68.0 in | Wt 198.0 lb

## 2016-05-02 DIAGNOSIS — K5732 Diverticulitis of large intestine without perforation or abscess without bleeding: Secondary | ICD-10-CM | POA: Diagnosis not present

## 2016-05-02 MED ORDER — POLYETHYLENE GLYCOL 3350 17 GM/SCOOP PO POWD: 1.0000 | Freq: Once | ORAL | 0 refills | Status: AC

## 2016-05-02 MED ORDER — METRONIDAZOLE 500 MG PO TABS: 500.0000 mg | ORAL_TABLET | ORAL | 0 refills | Status: DC

## 2016-05-02 MED ORDER — NEOMYCIN SULFATE 500 MG PO TABS: 500.0000 mg | ORAL_TABLET | ORAL | 0 refills | Status: DC
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
## 2016-05-02 NOTE — Telephone Encounter (Signed)
The patient is scheduled for surgery at [facility] on 05/20/16. He will pre admit by phone. He will have a bowel prep the night before with Miralax and antibiotics. He will hold his Metformin the day of prep and surgery. he [place] continue his 81 mg Aspirin. The patient is aware of date and instructions.

## 2016-05-02 NOTE — Patient Instructions (Signed)
Colon resectionLaparoscopic Colectomy Laparoscopic colectomy is surgery to remove part or all of the large intestine (colon). This procedure is used to treat several conditions, including:  Inflammation and infection of the colon (diverticulitis).  Tumors or masses in the colon.  Inflammatory bowel disease, such as Crohn disease or ulcerative colitis. Colectomy is an option when symptoms cannot be controlled with medicines.  Bleeding from the colon that cannot be controlled by another method.  Blockage or obstruction of the colon. LET [facility] CARE PROVIDER KNOW ABOUT:  Any allergies you have.  All medicines you are taking, including vitamins, herbs, eye drops, creams, and over-the-counter medicines.  Previous problems you or members of your family have had with the use of anesthetics.  Any blood disorders you have.  Previous surgeries you have had.  Medical conditions you have. RISKS AND COMPLICATIONS Generally, this is a safe procedure. However, as with any procedure, complications can occur. Possible complications include:  Infection.  Bleeding.  Damage to other organs.  Leaking from where the colon was sewn together.  Future blockage of the small intestines from scar tissue. Another surgery Carpenter be needed to repair this. In some cases, complications such as damage to other organs or excessive bleeding Droz require the surgeon to convert from a laparoscopic procedure to an open procedure. This involves making a larger incision in the abdomen to perform the procedure. BEFORE THE PROCEDURE  Ask your health care provider about changing or stopping any regular medicines.  You Tamez be prescribed an oral bowel prep. This involves drinking a large amount of medicated liquid, starting the day before your surgery. The liquid will cause you to have multiple loose stools until your stool is almost clear or light green. This cleans out your colon in preparation for the surgery.  Do  not eat or drink anything else once you have started the bowel prep, unless your health care provider tells you it is safe to do so.  You Glockner also be given antibiotic pills to clean out your colon of bacteria. Be sure to follow the directions carefully and take the medicine at the correct time. PROCEDURE   Small monitors will be put on your body. They are used to check your heart, blood pressure, and oxygen level.  An IV access tube will be put into one of your veins. Medicine will be able to flow directly into your body through this IV tube.  You might be given a medicine to help you relax (sedative).  You will be given a medicine to make you sleep through the procedure (general anesthetic). A breathing tube Barbian be placed into your lungs during the procedure.  A thin, flexible tube (catheter) will be placed into your bladder to collect urine.  A tube Delman be put in through your nose. It is called a nasogastric tube. It is used to remove stomach fluids after surgery until the intestines start working again.  Your abdomen will be filled with air so that it expands. This gives the surgeon more room to operate and makes your organs easier to see.  Several small cuts (incisions) are made in your abdomen.  A thin, lighted tube with a tiny camera on the end (laparoscope) is put through one of the small incisions. The camera on the laparoscope sends a picture to a TV screen in the operating room. This gives the surgeon a good view inside your abdomen.  Hollow tubes are put through the other small incisions in your abdomen. The  tools needed for the procedure are put through these tubes.  Clamps or staples are put on both ends of the diseased part of the colon.  The part of the intestine between the clamps or staples is removed.  If possible, the ends of the healthy colon that remain will be stitched or stapled together to allow your body to expel waste (stool).  Sometimes, the remaining colon  cannot be stitched back together. If this is the case, a colostomy is needed. For a colostomy:  An opening (stoma) to the outside of your body is made through the abdomen.  The end of the colon is brought to the opening. It is stitched to the skin.  A bag is attached to the opening. Stool will drain into this bag. The bag is removable.  The colostomy can be temporary or permanent.  The incisions from the colectomy are closed with stitches or staples. AFTER THE PROCEDURE  You will be monitored closely in a recovery area until you are stable and doing well. You will then be moved to a regular hospital room.  You will need to receive fluids through an IV tube until your bowel function has returned. This [place] take 1-3 days. Once your bowels are working again, you will be started on clear liquids and then advanced to solid food as tolerated.  You will be given pain medicines to control your pain.   This information is not intended to replace advice given to you by your health care provider. Make sure you discuss any questions you have with your health care provider.   Document Released: 10/15/2002 Document Revised: 05/15/2013 Document Reviewed: 03/06/2013 Elsevier Interactive Patient Education [employer]2016 [employer].

## 2016-05-12 ENCOUNTER — Other Ambulatory Visit: Payer: Self-pay | Admitting: General Surgery

## 2016-05-12 ENCOUNTER — Inpatient Hospital Stay
Admission: RE | Admit: 2016-05-12 | Discharge: 2016-05-12 | Disposition: A | Discharge status: Home / Self Care | Payer: BLUE CROSS/BLUE SHIELD | Source: Ambulatory Visit

## 2016-05-12 DIAGNOSIS — K5732 Diverticulitis of large intestine without perforation or abscess without bleeding: Secondary | ICD-10-CM
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
## 2016-05-13 ENCOUNTER — Encounter: Payer: Self-pay | Admitting: *Deleted

## 2016-05-13 NOTE — Patient Instructions (Signed)
  Your procedure is scheduled on: 05-20-16 (FRIDAY) Report to Same Day Surgery 2nd floor medical mall To find out your arrival time please call [phone] between 1PM - 3PM on 05-19-16 (THURSDAY)  Remember: Instructions that are not followed completely [place] result in serious medical risk, up to and including death, or upon the discretion of your surgeon and anesthesiologist your surgery [place] need to be rescheduled.    _x___ 1. Do not eat food or drink liquids after midnight. No gum chewing or hard candies.     __x__ 2. No Alcohol for 24 hours before or after surgery.   __x__3. No Smoking for 24 prior to surgery.   ____  4. Bring all medications with you on the day of surgery if instructed.    __x__ 5. Notify your doctor if there is any change in your medical condition     (cold, fever, infections).     Do not wear jewelry, make-up, hairpins, clips or nail polish.  Do not wear lotions, powders, or perfumes. You [place] wear deodorant.  Do not shave 48 hours prior to surgery. Men [place] shave face and neck.  Do not bring valuables to the hospital.    [facility] is not responsible for any belongings or valuables.               Contacts, dentures or bridgework [place] not be worn into surgery.  Leave your suitcase in the car. After surgery it Delay be brought to your room.  For patients admitted to the hospital, discharge time is determined by your treatment team.   Patients discharged the day of surgery will not be allowed to drive home.    Please read over the following fact sheets that you were given:   [facility] Preparing for Surgery and or MRSA Information   _x___ Take these medicines the morning of surgery with A SIP OF WATER:    1. NEXIUM  2.  3.  4.  5.  6.  ____Fleets enema or Magnesium Citrate as directed.   _x___ Use CHG Soap or sage wipes as directed on instruction sheet   _X___ Use inhalers on the day of surgery and bring to hospital day of surgery-USE NEBULIZER AT HOME  MORNING OF SURGERY AND BRING INHALER TO HOSPITAL  _X___ Stop metformin 2 days prior to surgery-LAST DOSE ON Tuesday, October 10TH    ____ Take 1/2 of usual insulin dose the night before surgery and none on the morning of  surgery.   ____ Stop aspirin or coumadin, or plavix-OK TO CONTINUE 81 MG ASPIRIN PER DR [facility] OFFICE  x__ Stop Anti-inflammatories such as Advil, Aleve, Ibuprofen, Motrin, Naproxen,          Naprosyn, Goodies powders or aspirin products NOW-Ok to take Tylenol.   ____ Stop supplements until after surgery.    ____ Bring C-Pap to the hospital.

## 2016-05-18 ENCOUNTER — Encounter
Admission: RE | Admit: 2016-05-18 | Discharge: 2016-05-18 | Disposition: A | Discharge status: Home / Self Care | Payer: BLUE CROSS/BLUE SHIELD | Source: Ambulatory Visit | Attending: General Surgery | Admitting: General Surgery

## 2016-05-18 DIAGNOSIS — Z79899 Other long term (current) drug therapy: Secondary | ICD-10-CM | POA: Diagnosis not present

## 2016-05-18 DIAGNOSIS — Z87891 Personal history of nicotine dependence: Secondary | ICD-10-CM | POA: Diagnosis not present

## 2016-05-18 DIAGNOSIS — E119 Type 2 diabetes mellitus without complications: Secondary | ICD-10-CM | POA: Diagnosis not present

## 2016-05-18 DIAGNOSIS — I25810 Atherosclerosis of coronary artery bypass graft(s) without angina pectoris: Secondary | ICD-10-CM | POA: Diagnosis not present

## 2016-05-18 DIAGNOSIS — I10 Essential (primary) hypertension: Secondary | ICD-10-CM | POA: Diagnosis not present

## 2016-05-18 DIAGNOSIS — Z981 Arthrodesis status: Secondary | ICD-10-CM | POA: Diagnosis not present

## 2016-05-18 DIAGNOSIS — Z951 Presence of aortocoronary bypass graft: Secondary | ICD-10-CM | POA: Diagnosis not present

## 2016-05-18 DIAGNOSIS — I739 Peripheral vascular disease, unspecified: Secondary | ICD-10-CM | POA: Diagnosis not present

## 2016-05-18 DIAGNOSIS — J449 Chronic obstructive pulmonary disease, unspecified: Secondary | ICD-10-CM | POA: Diagnosis not present

## 2016-05-18 DIAGNOSIS — Z7984 Long term (current) use of oral hypoglycemic drugs: Secondary | ICD-10-CM | POA: Diagnosis not present

## 2016-05-18 DIAGNOSIS — I2510 Atherosclerotic heart disease of native coronary artery without angina pectoris: Secondary | ICD-10-CM | POA: Diagnosis present

## 2016-05-18 DIAGNOSIS — K5720 Diverticulitis of large intestine with perforation and abscess without bleeding: Secondary | ICD-10-CM | POA: Diagnosis not present

## 2016-05-18 DIAGNOSIS — K5732 Diverticulitis of large intestine without perforation or abscess without bleeding: Secondary | ICD-10-CM | POA: Diagnosis not present

## 2016-05-18 DIAGNOSIS — Z7982 Long term (current) use of aspirin: Secondary | ICD-10-CM | POA: Diagnosis not present

## 2016-05-18 DIAGNOSIS — R109 Unspecified abdominal pain: Secondary | ICD-10-CM | POA: Diagnosis not present

## 2016-05-18 DIAGNOSIS — I252 Old myocardial infarction: Secondary | ICD-10-CM | POA: Diagnosis not present

## 2016-05-18 DIAGNOSIS — K219 Gastro-esophageal reflux disease without esophagitis: Secondary | ICD-10-CM | POA: Diagnosis present

## 2016-05-18 DIAGNOSIS — E7800 Pure hypercholesterolemia, unspecified: Secondary | ICD-10-CM | POA: Diagnosis present

## 2016-05-18 LAB — SURGICAL PCR SCREEN
MRSA, PCR: NEGATIVE
Staphylococcus aureus: NEGATIVE

## 2016-05-18 LAB — CBC WITH DIFFERENTIAL/PLATELET
Basophils Absolute: 0.1 10*3/uL (ref 0–0.1)
Basophils Relative: 1 %
Eosinophils Absolute: 0.3 10*3/uL (ref 0–0.7)
Eosinophils Relative: 3 %
HCT: 43.4 % (ref 40.0–52.0)
Hemoglobin: 15.4 g/dL (ref 13.0–18.0)
Lymphocytes Relative: 28 %
Lymphs Abs: 2.1 10*3/uL (ref 1.0–3.6)
MCH: 29.4 pg (ref 26.0–34.0)
MCHC: 35.4 g/dL (ref 32.0–36.0)
MCV: 83 fL (ref 80.0–100.0)
Monocytes Absolute: 0.5 10*3/uL (ref 0.2–1.0)
Monocytes Relative: 6 %
Neutro Abs: 4.6 10*3/uL (ref 1.4–6.5)
Neutrophils Relative %: 62 %
Platelets: 209 10*3/uL (ref 150–440)
RBC: 5.23 MIL/uL (ref 4.40–5.90)
RDW: 14.2 % (ref 11.5–14.5)
WBC: 7.5 10*3/uL (ref 3.8–10.6)

## 2016-05-18 LAB — BASIC METABOLIC PANEL
Anion gap: 5 (ref 5–15)
BUN: 19 mg/dL (ref 6–20)
CO2: 27 mmol/L (ref 22–32)
Calcium: 9.1 mg/dL (ref 8.9–10.3)
Chloride: 105 mmol/L (ref 101–111)
Creatinine, Ser: 0.83 mg/dL (ref 0.61–1.24)
GFR calc Af Amer: >60 mL/min (ref >60)
GFR calc non Af Amer: >60 mL/min (ref >60)
Glucose, Bld: 98 mg/dL (ref 65–99)
Potassium: 4.3 mmol/L (ref 3.5–5.1)
Sodium: 137 mmol/L (ref 135–145)

## 2016-05-19 NOTE — Pre-Procedure Instructions (Signed)
PULMONARY, CARDIAC AND MEDICAL CLEARANCE ON CHART

## 2016-05-20 ENCOUNTER — Encounter
Admission: AD | Disposition: A | Discharge status: Home / Self Care | Payer: Self-pay | Source: Ambulatory Visit | Attending: General Surgery

## 2016-05-20 ENCOUNTER — Inpatient Hospital Stay
Admission: AD | Admit: 2016-05-20 | Discharge: 2016-05-25 | DRG: 330 | Disposition: A | Discharge status: Home / Self Care | Payer: BLUE CROSS/BLUE SHIELD | Source: Ambulatory Visit | Attending: General Surgery | Admitting: General Surgery

## 2016-05-20 ENCOUNTER — Ambulatory Visit: Payer: BLUE CROSS/BLUE SHIELD | Admitting: Anesthesiology

## 2016-05-20 DIAGNOSIS — J449 Chronic obstructive pulmonary disease, unspecified: Secondary | ICD-10-CM | POA: Diagnosis present

## 2016-05-20 DIAGNOSIS — K5732 Diverticulitis of large intestine without perforation or abscess without bleeding: Secondary | ICD-10-CM | POA: Diagnosis not present

## 2016-05-20 DIAGNOSIS — E119 Type 2 diabetes mellitus without complications: Secondary | ICD-10-CM | POA: Diagnosis present

## 2016-05-20 DIAGNOSIS — Z79899 Other long term (current) drug therapy: Secondary | ICD-10-CM | POA: Diagnosis not present

## 2016-05-20 DIAGNOSIS — I2510 Atherosclerotic heart disease of native coronary artery without angina pectoris: Secondary | ICD-10-CM | POA: Diagnosis present

## 2016-05-20 DIAGNOSIS — I25810 Atherosclerosis of coronary artery bypass graft(s) without angina pectoris: Secondary | ICD-10-CM | POA: Diagnosis not present

## 2016-05-20 DIAGNOSIS — E7800 Pure hypercholesterolemia, unspecified: Secondary | ICD-10-CM | POA: Diagnosis present

## 2016-05-20 DIAGNOSIS — Z951 Presence of aortocoronary bypass graft: Secondary | ICD-10-CM | POA: Diagnosis not present

## 2016-05-20 DIAGNOSIS — I252 Old myocardial infarction: Secondary | ICD-10-CM | POA: Diagnosis not present

## 2016-05-20 DIAGNOSIS — Z7982 Long term (current) use of aspirin: Secondary | ICD-10-CM

## 2016-05-20 DIAGNOSIS — Z981 Arthrodesis status: Secondary | ICD-10-CM

## 2016-05-20 DIAGNOSIS — I10 Essential (primary) hypertension: Secondary | ICD-10-CM | POA: Diagnosis present

## 2016-05-20 DIAGNOSIS — K219 Gastro-esophageal reflux disease without esophagitis: Secondary | ICD-10-CM | POA: Diagnosis present

## 2016-05-20 DIAGNOSIS — I739 Peripheral vascular disease, unspecified: Secondary | ICD-10-CM | POA: Diagnosis not present

## 2016-05-20 DIAGNOSIS — Z789 Other specified health status: Secondary | ICD-10-CM | POA: Diagnosis present

## 2016-05-20 DIAGNOSIS — Z87891 Personal history of nicotine dependence: Secondary | ICD-10-CM

## 2016-05-20 DIAGNOSIS — R109 Unspecified abdominal pain: Secondary | ICD-10-CM | POA: Diagnosis present

## 2016-05-20 DIAGNOSIS — Z7984 Long term (current) use of oral hypoglycemic drugs: Secondary | ICD-10-CM | POA: Diagnosis not present

## 2016-05-20 DIAGNOSIS — K5720 Diverticulitis of large intestine with perforation and abscess without bleeding: Secondary | ICD-10-CM | POA: Diagnosis present

## 2016-05-20 HISTORY — PX: COLON RESECTION: SHX5231

## 2016-05-20 HISTORY — DX: Chronic kidney disease, unspecified: N18.9

## 2016-05-20 LAB — CBC
HCT: 41.3 % (ref 40.0–52.0)
Hemoglobin: 14.4 g/dL (ref 13.0–18.0)
MCH: 29.1 pg (ref 26.0–34.0)
MCHC: 34.8 g/dL (ref 32.0–36.0)
MCV: 83.7 fL (ref 80.0–100.0)
Platelets: 206 10*3/uL (ref 150–440)
RBC: 4.93 MIL/uL (ref 4.40–5.90)
RDW: 14.2 % (ref 11.5–14.5)
WBC: 15 10*3/uL — ABNORMAL HIGH (ref 3.8–10.6)

## 2016-05-20 LAB — GLUCOSE, CAPILLARY
Glucose-Capillary: 126 mg/dL — ABNORMAL HIGH (ref 65–99)
Glucose-Capillary: 174 mg/dL — ABNORMAL HIGH (ref 65–99)

## 2016-05-20 LAB — CREATININE, SERUM
Creatinine, Ser: 0.84 mg/dL (ref 0.61–1.24)
GFR calc Af Amer: >60 mL/min (ref >60)
GFR calc non Af Amer: >60 mL/min (ref >60)

## 2016-05-20 SURGERY — LAPAROSCOPIC SIGMOID COLON RESECTION
Anesthesia: General | Wound class: Clean Contaminated

## 2016-05-20 MED ORDER — FENTANYL CITRATE (PF) 100 MCG/2ML IJ SOLN
INTRAMUSCULAR | Status: AC
  Administered 2016-05-20: 25 ug via INTRAVENOUS
  Filled 2016-05-20: qty 2

## 2016-05-20 MED ORDER — OXYCODONE HCL 5 MG PO TABS: 5.0000 mg | ORAL_TABLET | Freq: Once | ORAL | Status: DC | PRN

## 2016-05-20 MED ORDER — ALBUTEROL SULFATE (2.5 MG/3ML) 0.083% IN NEBU
2.5000 mg | INHALATION_SOLUTION | Freq: Four times a day (QID) | RESPIRATORY_TRACT | Status: DC | PRN
Start: 2016-05-20 — End: 2016-05-25

## 2016-05-20 MED ORDER — SODIUM CHLORIDE 0.45 % IV SOLN
INTRAVENOUS | Status: DC
  Administered 2016-05-20 – 2016-05-24 (×8): via INTRAVENOUS

## 2016-05-20 MED ORDER — ALVIMOPAN 12 MG PO CAPS
12.0000 mg | ORAL_CAPSULE | Freq: Once | ORAL | Status: AC
  Administered 2016-05-20: 12 mg via ORAL

## 2016-05-20 MED ORDER — ALVIMOPAN 12 MG PO CAPS
ORAL_CAPSULE | ORAL | Status: AC
  Administered 2016-05-20: 12 mg via ORAL
  Filled 2016-05-20: qty 1

## 2016-05-20 MED ORDER — NITROGLYCERIN 0.4 MG SL SUBL: 0.4000 mg | SUBLINGUAL_TABLET | SUBLINGUAL | Status: DC | PRN

## 2016-05-20 MED ORDER — SUGAMMADEX SODIUM 200 MG/2ML IV SOLN
INTRAVENOUS | Status: DC | PRN
  Administered 2016-05-20: 200 mg via INTRAVENOUS

## 2016-05-20 MED ORDER — FLUTICASONE PROPIONATE 50 MCG/ACT NA SUSP
2.0000 | Freq: Every day | NASAL | Status: DC
Start: 2016-05-20 — End: 2016-05-25
  Administered 2016-05-20 – 2016-05-24 (×5): 2 via NASAL
  Filled 2016-05-20: qty 16

## 2016-05-20 MED ORDER — ONDANSETRON 4 MG PO TBDP: 4.0000 mg | ORAL_TABLET | Freq: Four times a day (QID) | ORAL | Status: DC | PRN

## 2016-05-20 MED ORDER — ROCURONIUM BROMIDE 100 MG/10ML IV SOLN
INTRAVENOUS | Status: DC | PRN
  Administered 2016-05-20: 20 mg via INTRAVENOUS
  Administered 2016-05-20: 50 mg via INTRAVENOUS
  Administered 2016-05-20 (×2): 10 mg via INTRAVENOUS
  Administered 2016-05-20: 20 mg via INTRAVENOUS

## 2016-05-20 MED ORDER — SODIUM CHLORIDE 0.9 % IV SOLN
1.0000 g | INTRAVENOUS | Status: AC
  Administered 2016-05-20: 1 g via INTRAVENOUS
  Filled 2016-05-20: qty 1

## 2016-05-20 MED ORDER — ONDANSETRON HCL 4 MG/2ML IJ SOLN
INTRAMUSCULAR | Status: DC | PRN
  Administered 2016-05-20: 4 mg via INTRAVENOUS

## 2016-05-20 MED ORDER — FENTANYL CITRATE (PF) 100 MCG/2ML IJ SOLN
INTRAMUSCULAR | Status: DC | PRN
  Administered 2016-05-20 (×5): 50 ug via INTRAVENOUS

## 2016-05-20 MED ORDER — IPRATROPIUM BROMIDE 0.02 % IN SOLN: 0.5000 mg | Freq: Four times a day (QID) | RESPIRATORY_TRACT | Status: DC | PRN

## 2016-05-20 MED ORDER — GABAPENTIN 100 MG PO CAPS
100.0000 mg | ORAL_CAPSULE | Freq: Every day | ORAL | Status: DC
  Administered 2016-05-20 – 2016-05-24 (×5): 100 mg via ORAL
  Filled 2016-05-20 (×5): qty 1

## 2016-05-20 MED ORDER — OXYCODONE HCL 5 MG PO TABS
5.0000 mg | ORAL_TABLET | ORAL | Status: DC | PRN
  Administered 2016-05-20 – 2016-05-24 (×11): 10 mg via ORAL
  Filled 2016-05-20 (×12): qty 2

## 2016-05-20 MED ORDER — CHLORHEXIDINE GLUCONATE CLOTH 2 % EX PADS
6.0000 | MEDICATED_PAD | Freq: Once | CUTANEOUS | Status: AC
  Administered 2016-05-20: 6 via TOPICAL

## 2016-05-20 MED ORDER — SODIUM CHLORIDE 0.9 % IV SOLN
INTRAVENOUS | Status: DC
  Administered 2016-05-20 (×2): via INTRAVENOUS

## 2016-05-20 MED ORDER — IPRATROPIUM-ALBUTEROL 0.5-2.5 (3) MG/3ML IN SOLN
3.0000 mL | Freq: Once | RESPIRATORY_TRACT | Status: AC
  Administered 2016-05-20: 3 mL via RESPIRATORY_TRACT

## 2016-05-20 MED ORDER — ALVIMOPAN 12 MG PO CAPS
12.0000 mg | ORAL_CAPSULE | Freq: Two times a day (BID) | ORAL | Status: DC
Start: 2016-05-21 — End: 2016-05-25
  Administered 2016-05-21 – 2016-05-25 (×9): 12 mg via ORAL
  Filled 2016-05-20 (×9): qty 1

## 2016-05-20 MED ORDER — SUCCINYLCHOLINE CHLORIDE 20 MG/ML IJ SOLN
INTRAMUSCULAR | Status: DC | PRN
  Administered 2016-05-20: 100 mg via INTRAVENOUS

## 2016-05-20 MED ORDER — PANTOPRAZOLE SODIUM 40 MG PO TBEC
40.0000 mg | DELAYED_RELEASE_TABLET | Freq: Two times a day (BID) | ORAL | Status: DC
  Administered 2016-05-20 – 2016-05-25 (×10): 40 mg via ORAL
  Filled 2016-05-20 (×10): qty 1

## 2016-05-20 MED ORDER — ENOXAPARIN SODIUM 40 MG/0.4ML ~~LOC~~ SOLN
40.0000 mg | SUBCUTANEOUS | Status: DC
  Administered 2016-05-21 – 2016-05-25 (×5): 40 mg via SUBCUTANEOUS
  Filled 2016-05-20 (×5): qty 0.4

## 2016-05-20 MED ORDER — FENTANYL CITRATE (PF) 100 MCG/2ML IJ SOLN
25.0000 ug | INTRAMUSCULAR | Status: AC | PRN
  Administered 2016-05-20 (×6): 25 ug via INTRAVENOUS

## 2016-05-20 MED ORDER — LIDOCAINE HCL (CARDIAC) 20 MG/ML IV SOLN
INTRAVENOUS | Status: DC | PRN
  Administered 2016-05-20: 100 mg via INTRAVENOUS

## 2016-05-20 MED ORDER — ACETAMINOPHEN 325 MG PO TABS
650.0000 mg | ORAL_TABLET | Freq: Four times a day (QID) | ORAL | Status: DC | PRN
  Administered 2016-05-22: 650 mg via ORAL
  Filled 2016-05-20: qty 2

## 2016-05-20 MED ORDER — ONDANSETRON HCL 4 MG/2ML IJ SOLN
4.0000 mg | Freq: Four times a day (QID) | INTRAMUSCULAR | Status: DC | PRN
  Administered 2016-05-21: 4 mg via INTRAVENOUS
  Filled 2016-05-20: qty 2

## 2016-05-20 MED ORDER — MORPHINE SULFATE (PF) 2 MG/ML IV SOLN
2.0000 mg | INTRAVENOUS | Status: DC | PRN
  Administered 2016-05-20 – 2016-05-25 (×22): 2 mg via INTRAVENOUS
  Filled 2016-05-20 (×22): qty 1

## 2016-05-20 MED ORDER — IPRATROPIUM-ALBUTEROL 0.5-2.5 (3) MG/3ML IN SOLN
RESPIRATORY_TRACT | Status: AC
  Filled 2016-05-20: qty 3

## 2016-05-20 MED ORDER — OXYCODONE HCL 5 MG/5ML PO SOLN: 5.0000 mg | Freq: Once | ORAL | Status: DC | PRN

## 2016-05-20 MED ORDER — ALBUTEROL SULFATE HFA 108 (90 BASE) MCG/ACT IN AERS: 2.0000 | INHALATION_SPRAY | Freq: Four times a day (QID) | RESPIRATORY_TRACT | Status: DC | PRN

## 2016-05-20 MED ORDER — MIDAZOLAM HCL 2 MG/2ML IJ SOLN
INTRAMUSCULAR | Status: DC | PRN
  Administered 2016-05-20: 2 mg via INTRAVENOUS

## 2016-05-20 MED ORDER — PROPOFOL 10 MG/ML IV BOLUS
INTRAVENOUS | Status: DC | PRN
  Administered 2016-05-20: 120 mg via INTRAVENOUS

## 2016-05-20 MED ORDER — TRAZODONE HCL 50 MG PO TABS
150.0000 mg | ORAL_TABLET | Freq: Every day | ORAL | Status: DC
  Administered 2016-05-20 – 2016-05-24 (×5): 150 mg via ORAL
  Filled 2016-05-20 (×5): qty 1

## 2016-05-20 MED ORDER — ACETAMINOPHEN 10 MG/ML IV SOLN
INTRAVENOUS | Status: AC
  Filled 2016-05-20: qty 100

## 2016-05-20 MED ORDER — LISINOPRIL 5 MG PO TABS
5.0000 mg | ORAL_TABLET | Freq: Every day | ORAL | Status: DC
  Administered 2016-05-20 – 2016-05-24 (×5): 5 mg via ORAL
  Filled 2016-05-20 (×5): qty 1

## 2016-05-20 MED ORDER — ACETAMINOPHEN 650 MG RE SUPP: 650.0000 mg | Freq: Four times a day (QID) | RECTAL | Status: DC | PRN

## 2016-05-20 MED ORDER — ACETAMINOPHEN 10 MG/ML IV SOLN
INTRAVENOUS | Status: DC | PRN
  Administered 2016-05-20: 1000 mg via INTRAVENOUS

## 2016-05-20 MED ORDER — IPRATROPIUM-ALBUTEROL 0.5-2.5 (3) MG/3ML IN SOLN: 3.0000 mL | Freq: Four times a day (QID) | RESPIRATORY_TRACT | Status: DC

## 2016-05-20 SURGICAL SUPPLY — 73 items
APPLIER CLIP ROT 10 11.4 M/L (STAPLE)
APR CLP MED LRG 11.4X10 (STAPLE)
BLADE SURG 10 STRL SS SAFETY (BLADE) ×2 IMPLANT
BLADE SURG 11 STRL SS SAFETY (MISCELLANEOUS) ×2 IMPLANT
CANISTER SUCT 1200ML W/VALVE (MISCELLANEOUS) ×2 IMPLANT
CANNULA DILATOR 10 W/SLV (CANNULA) ×2 IMPLANT
CATH TRAY 16F METER LATEX (MISCELLANEOUS) ×2 IMPLANT
CHLORAPREP W/TINT 26ML (MISCELLANEOUS) ×2 IMPLANT
CLEANER CAUTERY TIP 5X5 PAD (MISCELLANEOUS) ×1 IMPLANT
CLIP APPLIE ROT 10 11.4 M/L (STAPLE) ×1 IMPLANT
COVER CLAMP SIL LG PBX B (MISCELLANEOUS) IMPLANT
DEFOGGER SCOPE WARMER CLEARIFY (MISCELLANEOUS) ×2 IMPLANT
DEVICE HAND ACCESS DEXTUS (MISCELLANEOUS) IMPLANT
DRAPE INCISE IOBAN 66X45 STRL (DRAPES) ×2 IMPLANT
DRAPE LEGGINS SURG 28X43 STRL (DRAPES) ×2 IMPLANT
DRAPE UNDER BUTTOCK W/FLU (DRAPES) ×2 IMPLANT
DRSG OPSITE POSTOP 4X10 (GAUZE/BANDAGES/DRESSINGS) IMPLANT
DRSG OPSITE POSTOP 4X8 (GAUZE/BANDAGES/DRESSINGS) IMPLANT
DRSG TEGADERM 2-3/8X2-3/4 SM (GAUZE/BANDAGES/DRESSINGS) IMPLANT
DRSG TEGADERM 4X4.75 (GAUZE/BANDAGES/DRESSINGS) IMPLANT
DRSG TELFA 3X8 NADH (GAUZE/BANDAGES/DRESSINGS) IMPLANT
DRSG TELFA 4X8 ISLAND PHMB (GAUZE/BANDAGES/DRESSINGS) IMPLANT
ELECT BLADE 6.5 EXT (BLADE) ×2 IMPLANT
ELECT REM PT RETURN 9FT ADLT (ELECTROSURGICAL) ×2
ELECTRODE REM PT RTRN 9FT ADLT (ELECTROSURGICAL) ×1 IMPLANT
FILTER LAP SMOKE EVAC STRL (MISCELLANEOUS) ×2 IMPLANT
GLOVE BIO SURGEON STRL SZ7 (GLOVE) ×6 IMPLANT
GLOVE BIO SURGEON STRL SZ7.5 (GLOVE) ×6 IMPLANT
GLOVE INDICATOR 8.0 STRL GRN (GLOVE) ×2 IMPLANT
GOWN STRL REUS W/ TWL LRG LVL3 (GOWN DISPOSABLE) ×6 IMPLANT
GOWN STRL REUS W/TWL LRG LVL3 (GOWN DISPOSABLE) ×12
HANDLE YANKAUER SUCT BULB TIP (MISCELLANEOUS) ×2 IMPLANT
IRRIGATION STRYKERFLOW (MISCELLANEOUS) ×1 IMPLANT
IRRIGATOR STRYKERFLOW (MISCELLANEOUS) ×2
IV LACTATED RINGERS 1000ML (IV SOLUTION) ×2 IMPLANT
LABEL OR SOLS (LABEL) ×1 IMPLANT
NDL INSUFF ACCESS 14 VERSASTEP (NEEDLE) ×2 IMPLANT
NS IRRIG 500ML POUR BTL (IV SOLUTION) ×2 IMPLANT
PACK COLON CLEAN CLOSURE (MISCELLANEOUS) ×2 IMPLANT
PACK LAP CHOLECYSTECTOMY (MISCELLANEOUS) ×2 IMPLANT
PAD CLEANER CAUTERY TIP 5X5 (MISCELLANEOUS) ×1
PAD DRESSING TELFA 3X8 NADH (GAUZE/BANDAGES/DRESSINGS) IMPLANT
PAD PREP 24X41 OB/GYN DISP (PERSONAL CARE ITEMS) ×2 IMPLANT
PENCIL ELECTRO HAND CTR (MISCELLANEOUS) ×2 IMPLANT
PROT DEXTUS HAND ACCESS (MISCELLANEOUS) ×2
RELOAD LINEAR CUT PROX 55 BLUE (ENDOMECHANICALS) ×2 IMPLANT
RELOAD STAPLE 55 3.8 BLU REG (ENDOMECHANICALS) IMPLANT
RETRACTOR FIXED LENGTH SML (MISCELLANEOUS) ×1 IMPLANT
SCISSORS METZENBAUM CVD 33 (INSTRUMENTS) ×2 IMPLANT
SET YANKAUER POOLE SUCT (MISCELLANEOUS) ×1 IMPLANT
SHEARS HARMONIC ACE PLUS 36CM (ENDOMECHANICALS) IMPLANT
SLEEVE ENDOPATH XCEL 5M (ENDOMECHANICALS) ×2 IMPLANT
SPONGE LAP 18X18 5 PK (GAUZE/BANDAGES/DRESSINGS) ×2 IMPLANT
STAPLER PROXIMATE 55 BLUE (STAPLE) ×1 IMPLANT
SUT PROLENE 0 CT 1 30 (SUTURE) ×8 IMPLANT
SUT SILK 2 0 (SUTURE) ×2
SUT SILK 2-0 18XBRD TIE 12 (SUTURE) ×1 IMPLANT
SUT SILK 3-0 (SUTURE) ×2 IMPLANT
SUT VIC AB 0 CT1 36 (SUTURE) ×2 IMPLANT
SUT VIC AB 2-0 BRD 54 (SUTURE) ×2 IMPLANT
SUT VIC AB 2-0 CT1 27 (SUTURE) ×4
SUT VIC AB 2-0 CT1 TAPERPNT 27 (SUTURE) ×2 IMPLANT
SUT VIC AB 3-0 54X BRD REEL (SUTURE) ×2 IMPLANT
SUT VIC AB 3-0 BRD 54 (SUTURE) ×4
SUT VIC AB 3-0 SH 27 (SUTURE) ×4
SUT VIC AB 3-0 SH 27X BRD (SUTURE) ×2 IMPLANT
SUT VIC AB 4-0 FS2 27 (SUTURE) ×4 IMPLANT
SWABSTK COMLB BENZOIN TINCTURE (MISCELLANEOUS) ×2 IMPLANT
SYR BULB IRRIG 60ML STRL (SYRINGE) ×2 IMPLANT
TROCAR XCEL NON-BLD 11X100MML (ENDOMECHANICALS) ×1 IMPLANT
TROCAR XCEL NON-BLD 5MMX100MML (ENDOMECHANICALS) ×2 IMPLANT
TROCAR XCEL UNIV SLVE 11M 100M (ENDOMECHANICALS) ×6 IMPLANT
TUBING INSUFFLATOR HEATED (MISCELLANEOUS) ×2 IMPLANT
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
## 2016-05-20 NOTE — Transfer of Care (Signed)
Immediate Anesthesia Transfer of Care Note  Patient: [name]  Procedure(s) Performed: Procedure(s): LAPAROSCOPIC SIGMOID COLON RESECTION (N/A)  Patient Location: PACU  Anesthesia Type:General  Level of Consciousness: sedated  Airway & Oxygen Therapy: Patient connected to face mask oxygen  Post-op Assessment: Report given to RN and Post -op Vital signs reviewed and stable  Post vital signs: stable  Last Vitals:  Vitals:   05/20/16 0707 05/20/16 1151  BP: 134/75 (!) 149/68  Pulse: 94   Resp: 18 17  Temp: 36.4 C 36.4 C    Last Pain:  Vitals:   05/20/16 1151  TempSrc: Temporal  PainSc:          Complications: No apparent anesthesia complications

## 2016-05-20 NOTE — Anesthesia Postprocedure Evaluation (Signed)
Anesthesia Post Note  Patient: [name]  Procedure(s) Performed: Procedure(s) (LRB): LAPAROSCOPIC SIGMOID COLON RESECTION (N/A)  Patient location during evaluation: PACU Anesthesia Type: General Level of consciousness: awake and alert Pain management: pain level controlled Vital Signs Assessment: post-procedure vital signs reviewed and stable Respiratory status: spontaneous breathing, nonlabored ventilation, respiratory function stable and patient connected to nasal cannula oxygen Cardiovascular status: blood pressure returned to baseline and stable Postop Assessment: no signs of nausea or vomiting Anesthetic complications: no    Last Vitals:  Vitals:   05/20/16 1254 05/20/16 1258  BP:  112/73  Pulse: 92 87  Resp: 18 15  Temp:  36.9 C    Last Pain:  Vitals:   05/20/16 1258  TempSrc:   PainSc: 3                  Cleda Mccreedy Piscitello

## 2016-05-20 NOTE — Anesthesia Procedure Notes (Signed)
Procedure Name: Intubation Date/Time: 05/20/2016 8:50 AM Performed by: Irving Burton Pre-anesthesia Checklist: Patient identified, Emergency Drugs available, Suction available and Patient being monitored Patient Re-evaluated:Patient Re-evaluated prior to inductionOxygen Delivery Method: Circle system utilized Preoxygenation: Pre-oxygenation with 100% oxygen Intubation Type: IV induction Ventilation: Mask ventilation without difficulty Laryngoscope Size: Mac and 4 Grade View: Grade II Tube size: 7.5 mm Number of attempts: 1 Airway Equipment and Method: Stylet Placement Confirmation: ETT inserted through vocal cords under direct vision,  positive ETCO2 and breath sounds checked- equal and bilateral Secured at: 20 cm Tube secured with: Tape Dental Injury: Teeth and Oropharynx as per pre-operative assessment

## 2016-05-20 NOTE — Op Note (Signed)
Preop diagnosis: Recurring sigmoid diverticulitis  Post op diagnosis: Same  Operation: Laparoscopy hand-assisted sigmoid colon resection  Surgeon: Kathreen Cosier  Assistant: Lane Hacker   Anesthesia: Gen.  Complications: None  EBL: 100 mL  Drains: None  Description: Patient was put to sleep in supine position the operating table legs were placed on adjustable stirrups and the abdomen and perineal area prepped and draped sterile field after Foley catheter was inserted. Timeout was performed. Approximately 6 cm incision the midline was made below the level of the umbilicus and deepened through to the fascia which was incised the same line. The rectus muscle was retracted in the posterior sheath and peritoneum were carefully opened to enter the peritoneal cavity. A hand port was then placed and with the HandPort the as a port for camera's pneumoperitoneum was obtained. 11 mm port in place there were noted be adhesion of the omentum to the anterior abdominal wall from previous surgery. Subsequently a little right upper quadrant 11 mm port and a right lateral abdomen 5 mm port in the left lower quadrant 5 mm ports were placed. With a hand in placed through the HandPort the sigmoid colon area was satisfactorily exposed and carefully mobilized both with blunt and sharp dissections using the harmonic scalpel for control bleeding. It appeared that the area of involvement involved in the included just do the bulk of the sigmoid colon which was foreshortened by the thickened mesentery and the previous inflammation. The left colon was partially mobilized allow for easier approximation of the bowel subsequently. After was noted that the sigmoid colon could be mobilized easily towards the midline pneumoperitoneum was released. Through the hand port side with the portable hand port used as a wound retractor the sigmoid colon was brought up and after packing away the bowel above this lines of resection were  mapped out the bowel liver was normal above the area of previous inflammations and diverticula a was isolated and transected with a GIA the mesentery was then taken down with the use of Harmonic scalpel and ligatures of 2-0 Vicryl all the way down to the upper most rectum where the bowel again became normal. A Glassman clamp was then applied and the bowel was transected with the scissors at the rectal area. The resected sigmoid colon with subsequent sent to pathology in formalin. It was decided to do a handsewn anastomosis. The 2 ends of the bowel were brought together without any tension and a posterior layer of 3-0 silk was placed. Following this the staple line on the proximal end was removed and the 3-0 Vicryl was used in a running fashion to approximate the mucosa and submucosa all the way around. Following this an anterior layer of 3-0 GI silk was placed in the seromuscular layer completed the anastomosis. Abdominal cavity was then irrigated with the milliliter of saline and suctioned out until it was clear that the hemostasis was intact. The bowel was placed back in his position was noted to lie without any evidence of tension. Gloves and gown were changed and sterile towels were placed around the incision. The posterior sheath peritoneal layer were then closed with the running 0 Vicryl. The fascia was closed with interrupted figure-of-eight stitches of 0 proline. All skin incisions were closed with staples. A honeycomb dressings were placed and patient subsequently extubated and returned recovery room stable condition.

## 2016-05-20 NOTE — Interval H&P Note (Signed)
History and Physical Interval Note:  05/20/2016 7:23 AM  [name]  has presented today for surgery, with the diagnosis of SIGMOID  The various methods of treatment have been discussed with the patient and family. After consideration of risks, benefits and other options for treatment, the patient has consented to  Procedure(s): LAPAROSCOPIC SIGMOID COLON RESECTION (N/A) as a surgical intervention .  The patient's history has been reviewed, patient examined, no change in status, stable for surgery.  I have reviewed the patient's chart and labs.  Questions were answered to the patient's satisfaction.     SANKAR,SEEPLAPUTHUR G

## 2016-05-21 LAB — BASIC METABOLIC PANEL
Anion gap: 7 (ref 5–15)
BUN: 10 mg/dL (ref 6–20)
CO2: 24 mmol/L (ref 22–32)
Calcium: 8.2 mg/dL — ABNORMAL LOW (ref 8.9–10.3)
Chloride: 103 mmol/L (ref 101–111)
Creatinine, Ser: 0.72 mg/dL (ref 0.61–1.24)
GFR calc Af Amer: >60 mL/min (ref >60)
GFR calc non Af Amer: >60 mL/min (ref >60)
Glucose, Bld: 110 mg/dL — ABNORMAL HIGH (ref 65–99)
Potassium: 3.6 mmol/L (ref 3.5–5.1)
Sodium: 134 mmol/L — ABNORMAL LOW (ref 135–145)

## 2016-05-21 LAB — CBC
HCT: 40.6 % (ref 40.0–52.0)
Hemoglobin: 14 g/dL (ref 13.0–18.0)
MCH: 29.3 pg (ref 26.0–34.0)
MCHC: 34.6 g/dL (ref 32.0–36.0)
MCV: 84.8 fL (ref 80.0–100.0)
Platelets: 197 10*3/uL (ref 150–440)
RBC: 4.79 MIL/uL (ref 4.40–5.90)
RDW: 14.1 % (ref 11.5–14.5)
WBC: 9.5 10*3/uL (ref 3.8–10.6)

## 2016-05-21 NOTE — Progress Notes (Signed)
Patient ID: [name], male   DOB: 07/22/1956, 60 y.o.   MRN: [id number] Only complains of incisional pain.   No nausea or vomiting. Afebrile and vital signs are stable. Good urine output. Labs are okay. Abdomen is soft, mildly distended, hypoactive bowel sounds. Incision and port site are clean. Lungs clear Overall stable. We'll DC Foley today. Encouraged out of bed and ambulation.

## 2016-05-22 LAB — CBC
HCT: 38 % — ABNORMAL LOW (ref 40.0–52.0)
Hemoglobin: 13.4 g/dL (ref 13.0–18.0)
MCH: 29.5 pg (ref 26.0–34.0)
MCHC: 35.2 g/dL (ref 32.0–36.0)
MCV: 83.9 fL (ref 80.0–100.0)
Platelets: 177 10*3/uL (ref 150–440)
RBC: 4.54 MIL/uL (ref 4.40–5.90)
RDW: 14.1 % (ref 11.5–14.5)
WBC: 11.1 10*3/uL — ABNORMAL HIGH (ref 3.8–10.6)

## 2016-05-22 NOTE — Progress Notes (Signed)
Patient ID: [name], male   DOB: June 28, 1956, 60 y.o.   MRN: [id number] No complaints. Says he feels better today Has passed little flatus but no bowel movement yet. Voiding well after Foley was removed. Urine output is adequate Low-grade temp with a maximum 99.7. Heart rate at about 100. BP is stable Lungs are clear Abdomen is soft and much less distended today but has active bowel sounds Incision was clean Overall stable. Start on clear liquids today Check CBC today in view of her low-grade temp and mild tachycardia Encourage patient to get out of bed and ambulate-he walked once in the hallway yesterday only

## 2016-05-23 LAB — GLUCOSE, CAPILLARY: Glucose-Capillary: 172 mg/dL — ABNORMAL HIGH (ref 65–99)

## 2016-05-23 LAB — SURGICAL PATHOLOGY

## 2016-05-23 NOTE — Progress Notes (Signed)
Nurse recommend chaplain visit Pt to provide spiritual support and prayer, but Pt was asleep. Chaplain plans to do a follow-up the following day.    05/23/16 1700  Clinical Encounter Type  Visited With Patient  Visit Type Initial  Referral From Nurse  Spiritual Encounters  Spiritual Needs Prayer

## 2016-05-23 NOTE — Progress Notes (Signed)
Patient ID: [name], male   DOB: [birth year], 60 y.o.   MRN: [id number] Patient states he is doing well. Complains of some pain in the right shoulder area which is new. Is passing flatus but no bowel movement yet. One temp of 100 yesterday, rest normal. He is not tachycardic and vital signs are otherwise stable White blood count yesterday was 11,000. Abdomen is soft with active bowel sounds. Incision looks clean as also the port sites. Lungs with mild decreased breath sounds right base. Overall he is doing well. [name] have mild atelectasis in the right lung base which [name] account for his shoulder pain also. Advanced to full liquid diet. Advised patient to keep ambulating and use the incentive spirometer.

## 2016-05-24 LAB — GLUCOSE, CAPILLARY
Glucose-Capillary: 118 mg/dL — ABNORMAL HIGH (ref 65–99)
Glucose-Capillary: 108 mg/dL — ABNORMAL HIGH (ref 65–99)

## 2016-05-24 LAB — CREATININE, SERUM
Creatinine, Ser: 0.8 mg/dL (ref 0.61–1.24)
GFR calc Af Amer: >60 mL/min (ref >60)
GFR calc non Af Amer: >60 mL/min (ref >60)

## 2016-05-24 MED ORDER — MAGNESIUM HYDROXIDE 400 MG/5ML PO SUSP
30.0000 mL | Freq: Every day | ORAL | Status: DC | PRN
  Administered 2016-05-24: 30 mL via ORAL
  Filled 2016-05-24: qty 30

## 2016-05-24 NOTE — Progress Notes (Signed)
This was a follow-up visit with Pt. Chaplain had visited this Pt but he was a sleep so he decided to do follow-up and found Pt in good spirit. Patient talked to Ch about his family and then requested prayers for himself and his family which was provided.    05/24/16 1800  Clinical Encounter Type  Visited With Patient  Visit Type Follow-up;Spiritual support  Spiritual Encounters  Spiritual Needs Prayer;Emotional

## 2016-05-24 NOTE — Progress Notes (Signed)
  Patient ID: [name], male   DOB: Feb 16, 1956, 60 y.o.   MRN: [id number] He is doing well except no BM yet. Wants to have solid food. Passing flatus. AVSS. Abdomen is soft, active bowel sounds. Incision/port sites all clean. Lungs clear Overall doing well. Path- consistent with findings of diverticulitis. Plan= soft diet, mild laxative. D/C IV.

## 2016-05-25 LAB — GLUCOSE, CAPILLARY
Glucose-Capillary: 121 mg/dL — ABNORMAL HIGH (ref 65–99)
Glucose-Capillary: 130 mg/dL — ABNORMAL HIGH (ref 65–99)

## 2016-05-25 MED ORDER — OXYCODONE HCL 5 MG PO TABS: 5.0000 mg | ORAL_TABLET | ORAL | 0 refills | Status: DC | PRN

## 2016-05-25 NOTE — Discharge Summary (Signed)
Physician Discharge Summary  Patient ID: [name] MRN: [id number] DOB/AGE: 61-Jul-1957 61 y.o.  Admit date: 05/20/2016 Discharge date: 05/25/2016  Admission Diagnoses: recurring sigmoid colon diverticulitis  Discharge Diagnoses: same Active Problems:   1 minute Apgar score 8   Discharged Condition: good  Hospital Course: This 60 year old male had documented episodes of diverticulitis involving the sigmoid colon. In 5 separate episodes since January of this year 3 of which were documented by CT and involve both the proximal sigmoid and the distal sigmoid. Elective surgical resection was recommended and the patient was admitted this time for the same. On 05/20/2016 he underwent a laparoscopy and hand-assisted sigmoid colon resection. Postoperative course was basically uncomplicated. Even though he is passing flatus he did not have a bowel movement until late yesterday evening. He is tolerating his diet at this point and his vital signs have been stable. He is being discharged now to be followed as an outpatient Final pathology revealed evidence of from diverticulitis with some abscesses. Consults: None  Significant Diagnostic Studies: none  Treatments: surgery: Laparoscopy hand-assisted sigmoid colon resection  Discharge Exam: Blood pressure 132/70, pulse 94, temperature 98.3 F (36.8 C), temperature source Oral, resp. rate 16, height 5\' 8"  (1.727 m), weight 208 lb 14.4 oz (94.8 kg), SpO2 98 %. GI: soft, non-tender; bowel sounds normal; no masses,  no organomegaly  Disposition: 01-Home or Self Care  Discharge Instructions    Call MD for:  persistant nausea and vomiting    Complete by:  As directed    Call MD for:  redness, tenderness, or signs of infection (pain, swelling, redness, odor or green/yellow discharge around incision site)    Complete by:  As directed    Call MD for:  severe uncontrolled pain    Complete by:  As directed    Call MD for:  temperature >100.4     Complete by:  As directed    Diet Carb Modified    Complete by:  As directed    Discharge instructions    Complete by:  As directed    No exertional activity. Mayeda shower.       Medication List    TAKE these medications   albuterol (2.5 MG/3ML) 0.083% nebulizer solution Commonly known as:  PROVENTIL Take 2.5 mg by nebulization every 6 (six) hours as needed for wheezing or shortness of breath.   albuterol 108 (90 Base) MCG/ACT inhaler Commonly known as:  PROVENTIL HFA Inhale 2 puffs into the lungs every 6 (six) hours as needed for wheezing or shortness of breath.   ANDROGEL 20.25 MG/1.25GM (1.62%) Gel Generic drug:  Testosterone Place 1-2 application onto the skin daily. Apply 1-2 squirts to each shoulder   aspirin EC 81 MG tablet Take 81 mg by mouth daily.   chlorpheniramine-HYDROcodone 10-8 MG/5ML Suer Commonly known as:  TUSSIONEX PENNKINETIC ER Take 5 mLs by mouth every 12 (twelve) hours as needed for cough.   diphenhydrAMINE 25 mg capsule Commonly known as:  BENADRYL Take 1 capsule (25 mg total) by mouth every 8 (eight) hours as needed for itching.   docusate sodium 100 MG capsule Commonly known as:  COLACE Take 1 capsule (100 mg total) by mouth 2 (two) times daily. What changed:  when to take this  reasons to take this   EPIPEN 2-PAK 0.3 mg/0.3 mL Soaj injection Generic drug:  EPINEPHrine Inject 0.3 mg into the muscle as needed (for allergic reaction).   esomeprazole 40 MG capsule Commonly known as:  NEXIUM Take  1 capsule (40 mg total) by mouth daily. What changed:  when to take this   fluticasone 50 MCG/ACT nasal spray Commonly known as:  FLONASE Place 2 sprays into both nostrils at bedtime.   gabapentin 100 MG capsule Commonly known as:  NEURONTIN Take 100 mg by mouth at bedtime.   ibuprofen 400 MG tablet Commonly known as:  ADVIL,MOTRIN Take 1 tablet (400 mg total) by mouth every 6 (six) hours as needed. What changed:  reasons to take this    ipratropium 0.02 % nebulizer solution Commonly known as:  ATROVENT Take 0.5 mg by nebulization every 6 (six) hours as needed for wheezing or shortness of breath.   lisinopril 5 MG tablet Commonly known as:  PRINIVIL,ZESTRIL Take 5 mg by mouth at bedtime.   loratadine 10 MG tablet Commonly known as:  CLARITIN Take 1 tablet (10 mg total) by mouth daily.   magnesium oxide 400 MG tablet Commonly known as:  MAG-OX Take 800 mg by mouth at bedtime.   metFORMIN 500 MG tablet Commonly known as:  GLUCOPHAGE Take 1 tablet by mouth 2 (two) times daily.   metoprolol succinate 25 MG 24 hr tablet Commonly known as:  TOPROL-XL Take 25 mg by mouth at bedtime.   metroNIDAZOLE 500 MG tablet Commonly known as:  FLAGYL Take 1 tablet (500 mg total) by mouth See admin instructions. Take 1 tablet at 6 pm and take 1 tablet at 11 pm on 05/19/16.   neomycin 500 MG tablet Commonly known as:  MYCIFRADIN Take 1 tablet (500 mg total) by mouth See admin instructions. Take 2 tablets at 6 pm, then take 2 tablets at 11 pm on 05/19/16.   nitroGLYCERIN 0.4 MG SL tablet Commonly known as:  NITROSTAT Place 0.4 mg under the tongue every 5 (five) minutes as needed for chest pain.   ondansetron 4 MG disintegrating tablet Commonly known as:  ZOFRAN ODT Take 1 tablet (4 mg total) by mouth every 6 (six) hours as needed for nausea or vomiting.   oxyCODONE 5 MG immediate release tablet Commonly known as:  Oxy IR/ROXICODONE Take 1-2 tablets (5-10 mg total) by mouth every 4 (four) hours as needed for moderate pain.   traZODone 150 MG tablet Commonly known as:  DESYREL Take 150 mg by mouth at bedtime.   VYTORIN 10-10 MG tablet Generic drug:  ezetimibe-simvastatin Take 1 tablet by mouth at bedtime.      Follow-up Information    Kieth BrightlySANKAR,SEEPLAPUTHUR G, MD. Schedule an appointment as soon as possible for a visit in 5 day(s).   Specialties:  General Surgery, Radiology Why:  staple removal, post op check Contact  information: [address] [place] [id number] [phone]           Signed: Kieth BrightlySANKAR,SEEPLAPUTHUR G 05/25/2016, 12:11 PM

## 2016-05-25 NOTE — Discharge Instructions (Signed)
Laparoscopic Colectomy  Laparoscopic colectomy is surgery to remove part or all of the large intestine (colon). This procedure is used to treat several conditions, including:  · Inflammation and infection of the colon (diverticulitis).  · Tumors or masses in the colon.  · Inflammatory bowel disease, such as Crohn disease or ulcerative colitis. Colectomy is an option when symptoms cannot be controlled with medicines.  · Bleeding from the colon that cannot be controlled by another method.  · Blockage or obstruction of the colon.  LET YOUR HEALTH CARE PROVIDER KNOW ABOUT:  · Any allergies you have.  · All medicines you are taking, including vitamins, herbs, eye drops, creams, and over-the-counter medicines.  · Previous problems you or members of your family have had with the use of anesthetics.  · Any blood disorders you have.  · Previous surgeries you have had.  · Medical conditions you have.  RISKS AND COMPLICATIONS  Generally, this is a safe procedure. However, as with any procedure, complications can occur. Possible complications include:  · Infection.  · Bleeding.  · Damage to other organs.  · Leaking from where the colon was sewn together.  · Future blockage of the small intestines from scar tissue. Another surgery Dhaliwal be needed to repair this.  In some cases, complications such as damage to other organs or excessive bleeding Hitz require the surgeon to convert from a laparoscopic procedure to an open procedure. This involves making a larger incision in the abdomen to perform the procedure.  BEFORE THE PROCEDURE  · Ask your health care provider about changing or stopping any regular medicines.  · You Erhart be prescribed an oral bowel prep. This involves drinking a large amount of medicated liquid, starting the day before your surgery. The liquid will cause you to have multiple loose stools until your stool is almost clear or light green. This cleans out your colon in preparation for the surgery.  · Do not eat or  drink anything else once you have started the bowel prep, unless your health care provider tells you it is safe to do so.  · You Leaton also be given antibiotic pills to clean out your colon of bacteria. Be sure to follow the directions carefully and take the medicine at the correct time.  PROCEDURE   · Small monitors will be put on your body. They are used to check your heart, blood pressure, and oxygen level.  · An IV access tube will be put into one of your veins. Medicine will be able to flow directly into your body through this IV tube.  · You might be given a medicine to help you relax (sedative).  · You will be given a medicine to make you sleep through the procedure (general anesthetic). A breathing tube Holdren be placed into your lungs during the procedure.  · A thin, flexible tube (catheter) will be placed into your bladder to collect urine.  · A tube Gunderson be put in through your nose. It is called a nasogastric tube. It is used to remove stomach fluids after surgery until the intestines start working again.  · Your abdomen will be filled with air so that it expands. This gives the surgeon more room to operate and makes your organs easier to see.  · Several small cuts (incisions) are made in your abdomen.  · A thin, lighted tube with a tiny camera on the end (laparoscope) is put through one of the small incisions. The camera on the laparoscope   sends a picture to a TV screen in the operating room. This gives the surgeon a good view inside your abdomen.  · Hollow tubes are put through the other small incisions in your abdomen. The tools needed for the procedure are put through these tubes.  · Clamps or staples are put on both ends of the diseased part of the colon.  · The part of the intestine between the clamps or staples is removed.  · If possible, the ends of the healthy colon that remain will be stitched or stapled together to allow your body to expel waste (stool).  · Sometimes, the remaining colon cannot be  stitched back together. If this is the case, a colostomy is needed. For a colostomy:  ¨ An opening (stoma) to the outside of your body is made through the abdomen.  ¨ The end of the colon is brought to the opening. It is stitched to the skin.  ¨ A bag is attached to the opening. Stool will drain into this bag. The bag is removable.  ¨ The colostomy can be temporary or permanent.  · The incisions from the colectomy are closed with stitches or staples.  AFTER THE PROCEDURE  · You will be monitored closely in a recovery area until you are stable and doing well. You will then be moved to a regular hospital room.  · You will need to receive fluids through an IV tube until your bowel function has returned. This Egnew take 1-3 days. Once your bowels are working again, you will be started on clear liquids and then advanced to solid food as tolerated.  · You will be given pain medicines to control your pain.     This information is not intended to replace advice given to you by your health care provider. Make sure you discuss any questions you have with your health care provider.     Document Released: 10/15/2002 Document Revised: 05/15/2013 Document Reviewed: 03/06/2013  [employer]vier Interactive Patient Education ©2016 [employer]

## 2016-05-25 NOTE — Progress Notes (Addendum)
Patient d/c'd home with wife. Education provided, no questions at this time. Oxycodone prescription given to patient.  Trudee Kuster

## 2016-05-30 ENCOUNTER — Ambulatory Visit (INDEPENDENT_AMBULATORY_CARE_PROVIDER_SITE_OTHER): Payer: BLUE CROSS/BLUE SHIELD | Admitting: General Surgery

## 2016-05-30 ENCOUNTER — Encounter: Payer: Self-pay | Admitting: General Surgery

## 2016-05-30 VITALS — BP 136/68 | HR 88 | Resp 14 | Ht 68.0 in | Wt 190.0 lb

## 2016-05-30 DIAGNOSIS — K5732 Diverticulitis of large intestine without perforation or abscess without bleeding: Secondary | ICD-10-CM

## 2016-05-30 NOTE — Patient Instructions (Signed)
Return in ine month.  
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
## 2016-06-08 DIAGNOSIS — M66822 Spontaneous rupture of other tendons, left upper arm: Secondary | ICD-10-CM | POA: Diagnosis not present

## 2016-06-27 ENCOUNTER — Encounter: Payer: Self-pay | Admitting: General Surgery

## 2016-06-27 ENCOUNTER — Ambulatory Visit: Payer: BLUE CROSS/BLUE SHIELD | Admitting: Surgery

## 2016-06-27 ENCOUNTER — Ambulatory Visit (INDEPENDENT_AMBULATORY_CARE_PROVIDER_SITE_OTHER): Payer: BLUE CROSS/BLUE SHIELD | Admitting: General Surgery

## 2016-06-27 ENCOUNTER — Other Ambulatory Visit (HOSPITAL_COMMUNITY): Payer: BLUE CROSS/BLUE SHIELD

## 2016-06-27 VITALS — BP 124/68 | HR 74 | Resp 12 | Ht 67.0 in | Wt 197.0 lb

## 2016-06-27 DIAGNOSIS — K5732 Diverticulitis of large intestine without perforation or abscess without bleeding: Secondary | ICD-10-CM

## 2016-06-27 NOTE — Patient Instructions (Addendum)
Return in 1 year for follow-up Call if new issues develop
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
## 2016-06-29 ENCOUNTER — Encounter: Payer: Self-pay | Admitting: General Surgery

## 2016-07-13 ENCOUNTER — Encounter: Payer: Self-pay | Admitting: Surgery

## 2016-07-18 ENCOUNTER — Ambulatory Visit (INDEPENDENT_AMBULATORY_CARE_PROVIDER_SITE_OTHER): Payer: BLUE CROSS/BLUE SHIELD | Admitting: Surgery

## 2016-07-18 ENCOUNTER — Encounter: Payer: Self-pay | Admitting: Surgery

## 2016-07-18 ENCOUNTER — Ambulatory Visit (HOSPITAL_COMMUNITY)
Admission: RE | Admit: 2016-07-18 | Discharge: 2016-07-18 | Disposition: A | Discharge status: Home / Self Care | Payer: BLUE CROSS/BLUE SHIELD | Source: Ambulatory Visit | Attending: Surgery | Admitting: Surgery

## 2016-07-18 VITALS — BP 113/67 | HR 72 | Temp 97.2°F | Resp 20 | Ht 68.0 in | Wt 194.0 lb

## 2016-07-18 DIAGNOSIS — Z48812 Encounter for surgical aftercare following surgery on the circulatory system: Secondary | ICD-10-CM | POA: Diagnosis not present

## 2016-07-18 DIAGNOSIS — S45102A Unspecified injury of brachial artery, left side, initial encounter: Secondary | ICD-10-CM | POA: Diagnosis not present

## 2016-07-18 DIAGNOSIS — M79601 Pain in right arm: Secondary | ICD-10-CM | POA: Diagnosis not present

## 2016-07-18 DIAGNOSIS — I25709 Atherosclerosis of coronary artery bypass graft(s), unspecified, with unspecified angina pectoris: Secondary | ICD-10-CM | POA: Diagnosis not present

## 2016-07-18 DIAGNOSIS — M79602 Pain in left arm: Secondary | ICD-10-CM | POA: Insufficient documentation

## 2016-07-18 DIAGNOSIS — S45119D Laceration of brachial artery, unspecified side, subsequent encounter: Secondary | ICD-10-CM | POA: Diagnosis not present

## 2016-07-18 NOTE — Progress Notes (Signed)
[facility]  Patient name: [name] MRN: [id number] DOB: 1956-01-04 Sex: male  REASON FOR VISIT: follow up  HPI: [name] is a 60 y.o. male who is here for his first postoperative visit.  On 03/10/2016 he underwent a left brachial to left radial artery bypass graft with ipsilateral non-reversed greater saphenous vein.  He suffered a brachial artery transection during a outpatient redo bicep tendon repair.  [facility] were [facility] underwent the above procedure.  He is in the hospital for 2 days postoperatively  Past Medical History:  Diagnosis Date  . Allergic rhinitis   . Arthritis   . Arthritis of left acromioclavicular joint 07/25/2014  . Asthma   . Chronic bronchitis (HCC)   . Chronic kidney disease    H/O STONES  . Colon polyp   . Coronary artery disease   . Cramps, muscle, general   . Diabetes mellitus without complication (HCC)   . Diverticula of colon   . GERD (gastroesophageal reflux disease)   . Hypercholesteremia   . Hypertension   . Impingement syndrome of left shoulder 07/25/2014  . Laceration of brachial artery 03/11/2016   left arm  . Leg cramps   . Lumbar disc disease   . Myocardial infarction     Family History  Problem Relation Age of Onset  . Asthma Father   . Emphysema Father   . Stroke Mother     SOCIAL HISTORY: Social History  Substance Use Topics  . Smoking status: Former Smoker    Packs/day: 0.00    Years: 10.00    Types: Cigars, Cigarettes    Quit date: 08/09/2003  . Smokeless tobacco: Former Neurosurgeon     Comment: 16/per day  . Alcohol use 0.0 oz/week     Comment: Occasional    Allergies  Allergen Reactions  . 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 Niacin And Related Other (See Comments)    unknown     Current Outpatient Prescriptions  Medication Sig Dispense Refill  . albuterol (PROVENTIL HFA) 108 (90 Base) MCG/ACT inhaler Inhale 2 puffs into the lungs every 6 (six) hours as needed for wheezing or shortness of breath. 1 Inhaler prn  . albuterol (PROVENTIL) (2.5 MG/3ML) 0.083% nebulizer solution Take 2.5 mg by nebulization every 6 (six) hours as needed for wheezing or shortness of breath.    Marland Kitchen aspirin EC 81 MG tablet Take 81 mg by mouth daily.    . chlorpheniramine-HYDROcodone (TUSSIONEX PENNKINETIC ER) 10-8 MG/5ML SUER Take 5 mLs by mouth every 12 (twelve) hours as needed for cough. 140 mL 0  . diphenhydrAMINE (BENADRYL) 25 mg capsule Take 1 capsule (25 mg total) by mouth every 8 (eight) hours as needed for itching.    . docusate sodium (COLACE) 100 MG capsule Take 1 capsule (100 mg total) by mouth 2 (two) times daily. (Patient taking differently: Take 100 mg by mouth 2 (two) times daily as needed. ) 60 capsule 2  . EPINEPHrine (EPIPEN 2-PAK) 0.3 mg/0.3 mL IJ SOAJ injection Inject 0.3 mg into the muscle as needed (for allergic reaction).     Marland Kitchen esomeprazole (NEXIUM) 40 MG capsule Take 1 capsule (40 mg total) by mouth daily. (Patient taking differently: Take 40 mg by mouth at bedtime. ) 30 capsule  12  . ezetimibe-simvastatin (VYTORIN) 10-10 MG per tablet Take 1 tablet by mouth at bedtime.     . fluticasone (FLONASE) 50 MCG/ACT nasal spray Place 2 sprays into both nostrils at bedtime.     . gabapentin (NEURONTIN) 100 MG capsule Take 100 mg by mouth at bedtime.    Marland Kitchen. ibuprofen (ADVIL,MOTRIN) 400 MG tablet Take 1 tablet (400 mg total) by mouth every 6 (six) hours as needed. (Patient taking differently: Take 400 mg by mouth every 6 (six) hours as needed for moderate pain. ) 30 tablet 0  . ipratropium (ATROVENT) 0.02 % nebulizer solution Take 0.5 mg by nebulization every 6 (six) hours as needed for wheezing or shortness of breath.    . lisinopril (PRINIVIL,ZESTRIL) 5 MG tablet Take 5 mg by mouth at  bedtime.   0  . loratadine (CLARITIN) 10 MG tablet Take 1 tablet (10 mg total) by mouth daily. 30 tablet 12  . magnesium oxide (MAG-OX) 400 MG tablet Take 800 mg by mouth at bedtime.     . metFORMIN (GLUCOPHAGE) 500 MG tablet Take 1 tablet by mouth 2 (two) times daily.  2  . metoprolol succinate (TOPROL-XL) 25 MG 24 hr tablet Take 25 mg by mouth at bedtime.     . metroNIDAZOLE (FLAGYL) 500 MG tablet Take 1 tablet (500 mg total) by mouth See admin instructions. Take 1 tablet at 6 pm and take 1 tablet at 11 pm on 05/19/16. 2 tablet 0  . neomycin (MYCIFRADIN) 500 MG tablet Take 1 tablet (500 mg total) by mouth See admin instructions. Take 2 tablets at 6 pm, then take 2 tablets at 11 pm on 05/19/16. 4 tablet 0  . nitroGLYCERIN (NITROSTAT) 0.4 MG SL tablet Place 0.4 mg under the tongue every 5 (five) minutes as needed for chest pain.     Marland Kitchen. ondansetron (ZOFRAN ODT) 4 MG disintegrating tablet Take 1 tablet (4 mg total) by mouth every 6 (six) hours as needed for nausea or vomiting. 20 tablet 0  . Testosterone (ANDROGEL) 20.25 MG/1.25GM (1.62%) GEL Place 1-2 application onto the skin daily. Apply 1-2 squirts to each shoulder    . traZODone (DESYREL) 150 MG tablet Take 150 mg by mouth at bedtime.    Marland Kitchen. oxyCODONE (OXY IR/ROXICODONE) 5 MG immediate release tablet Take 1-2 tablets (5-10 mg total) by mouth every 4 (four) hours as needed for moderate pain. (Patient not taking: Reported on 07/18/2016) 30 tablet 0   No current facility-administered medications for this visit.     REVIEW OF SYSTEMS:  [X]  denotes positive finding, [ ]  denotes negative finding Cardiac  Comments:  Chest pain or chest pressure:    Shortness of breath upon exertion:    Short of breath when lying flat:    Irregular heart rhythm:        Vascular    Pain in calf, thigh, or hip brought on by ambulation:    Pain in feet at night that wakes you up from your sleep:     Blood clot in your veins:    Leg swelling:         Pulmonary      Oxygen at home:    Productive cough:     Wheezing:         Neurologic    Sudden weakness in arms or legs:     Sudden numbness in arms or legs:     Sudden onset of difficulty speaking or slurred speech:    Temporary loss of  vision in one eye:     Problems with dizziness:         Gastrointestinal    Blood in stool:     Vomited blood:         Genitourinary    Burning when urinating:     Blood in urine:        Psychiatric    Major depression:         Hematologic    Bleeding problems:    Problems with blood clotting too easily:        Skin    Rashes or ulcers:        Constitutional    Fever or chills:      PHYSICAL EXAM: Vitals:   07/18/16 1153  BP: 113/67  Pulse: 72  Resp: 20  Temp: 97.2 F (36.2 C)  TempSrc: Oral  SpO2: 95%  Weight: 194 lb (88 kg)  Height: 5\' 8"  (1.727 m)    GENERAL: The patient is a well-nourished male, in no acute distress. The vital signs are documented above. CARDIAC: There is a regular rate and rhythm.  VASCULAR: Palpable left radial pulse PULMONARY: Non-labored respirations MUSCULOSKELETAL: There are no major deformities or cyanosis. NEUROLOGIC: No focal weakness or paresthesias are detected. SKIN: There are no ulcers or rashes noted. PSYCHIATRIC: The patient has a normal affect.  DATA:  Duplex shows partially occlusive heterogeneous plaque at the proximal anastomosis with no internal vessel narrowing and no elevated velocities  MEDICAL ISSUES: Overall, the patient is doing very well.  I think he needs to be followed for his bypass graft, particularly since he has a possible narrowing at the proximal anastomosis.  I have him scheduled to follow up in 6 months.  If this 6 months ultrasound is unchanged, we can go to yearly follow-up.  I did discuss with him the signs and symptoms to look out for bypass graft occlusion.    Durene Cal, MD [facility] [phone] Pager [phone]

## 2016-07-26 NOTE — Addendum Note (Signed)
Addended by: Burton Apley A on: 07/26/2016 09:12 AM   Modules accepted: Orders

## 2016-07-27 ENCOUNTER — Other Ambulatory Visit: Payer: Self-pay | Admitting: Internal Medicine

## 2016-07-29 DIAGNOSIS — E785 Hyperlipidemia, unspecified: Secondary | ICD-10-CM | POA: Diagnosis not present

## 2016-07-29 DIAGNOSIS — I2510 Atherosclerotic heart disease of native coronary artery without angina pectoris: Secondary | ICD-10-CM | POA: Diagnosis not present

## 2016-08-10 DIAGNOSIS — M25511 Pain in right shoulder: Secondary | ICD-10-CM | POA: Diagnosis not present

## 2016-08-18 ENCOUNTER — Telehealth: Payer: Self-pay | Admitting: Internal Medicine

## 2016-08-18 MED ORDER — HYDROCOD POLST-CPM POLST ER 10-8 MG/5ML PO SUER: 5.0000 mL | Freq: Two times a day (BID) | ORAL | 0 refills | Status: DC | PRN

## 2016-08-18 MED ORDER — AZITHROMYCIN 250 MG PO TABS: ORAL_TABLET | ORAL | 0 refills | Status: AC

## 2016-08-18 NOTE — Telephone Encounter (Signed)
Spoke with pt's wife, who states pt had prod cough with yellow mucus,wheezing, increased sob & chest congestion X 2d Pt denies any fever, chills or sweats. Pt isn't taking any OTC medications. Pt request a Zpak and tussionex.  CY please advise. Thanks.  Current Outpatient Prescriptions on File Prior to Visit  Medication Sig Dispense Refill  . albuterol (PROVENTIL HFA) 108 (90 Base) MCG/ACT inhaler Inhale 2 puffs into the lungs every 6 (six) hours as needed for wheezing or shortness of breath. 1 Inhaler prn  . albuterol (PROVENTIL) (2.5 MG/3ML) 0.083% nebulizer solution Take 2.5 mg by nebulization every 6 (six) hours as needed for wheezing or shortness of breath.    Marland Kitchen aspirin EC 81 MG tablet Take 81 mg by mouth daily.    . chlorpheniramine-HYDROcodone (TUSSIONEX PENNKINETIC ER) 10-8 MG/5ML SUER Take 5 mLs by mouth every 12 (twelve) hours as needed for cough. 140 mL 0  . diphenhydrAMINE (BENADRYL) 25 mg capsule Take 1 capsule (25 mg total) by mouth every 8 (eight) hours as needed for itching.    . docusate sodium (COLACE) 100 MG capsule Take 1 capsule (100 mg total) by mouth 2 (two) times daily. (Patient taking differently: Take 100 mg by mouth 2 (two) times daily as needed. ) 60 capsule 2  . EPINEPHrine (EPIPEN 2-PAK) 0.3 mg/0.3 mL IJ SOAJ injection Inject 0.3 mg into the muscle as needed (for allergic reaction).     Marland Kitchen EPIPEN 2-PAK 0.3 MG/0.3ML SOAJ injection INJECT ONE SYRINGEFUL INTO THE MUSCLE ONCE AS NEEDED FOR ALLERGIC REACTION 2 Device 0  . esomeprazole (NEXIUM) 40 MG capsule Take 1 capsule (40 mg total) by mouth daily. (Patient taking differently: Take 40 mg by mouth at bedtime. ) 30 capsule 12  . ezetimibe-simvastatin (VYTORIN) 10-10 MG per tablet Take 1 tablet by mouth at bedtime.     . fluticasone (FLONASE) 50 MCG/ACT nasal spray Place 2 sprays into both nostrils at bedtime.     . gabapentin (NEURONTIN) 100 MG capsule Take 100 mg by mouth at bedtime.    Marland Kitchen ibuprofen (ADVIL,MOTRIN) 400 MG  tablet Take 1 tablet (400 mg total) by mouth every 6 (six) hours as needed. (Patient taking differently: Take 400 mg by mouth every 6 (six) hours as needed for moderate pain. ) 30 tablet 0  . ipratropium (ATROVENT) 0.02 % nebulizer solution Take 0.5 mg by nebulization every 6 (six) hours as needed for wheezing or shortness of breath.    . lisinopril (PRINIVIL,ZESTRIL) 5 MG tablet Take 5 mg by mouth at bedtime.   0  . loratadine (CLARITIN) 10 MG tablet Take 1 tablet (10 mg total) by mouth daily. 30 tablet 12  . magnesium oxide (MAG-OX) 400 MG tablet Take 800 mg by mouth at bedtime.     . metFORMIN (GLUCOPHAGE) 500 MG tablet Take 1 tablet by mouth 2 (two) times daily.  2  . metoprolol succinate (TOPROL-XL) 25 MG 24 hr tablet Take 25 mg by mouth at bedtime.     . metroNIDAZOLE (FLAGYL) 500 MG tablet Take 1 tablet (500 mg total) by mouth See admin instructions. Take 1 tablet at 6 pm and take 1 tablet at 11 pm on 05/19/16. 2 tablet 0  . neomycin (MYCIFRADIN) 500 MG tablet Take 1 tablet (500 mg total) by mouth See admin instructions. Take 2 tablets at 6 pm, then take 2 tablets at 11 pm on 05/19/16. 4 tablet 0  . nitroGLYCERIN (NITROSTAT) 0.4 MG SL tablet Place 0.4 mg under the tongue every  5 (five) minutes as needed for chest pain.     Marland Kitchen ondansetron (ZOFRAN ODT) 4 MG disintegrating tablet Take 1 tablet (4 mg total) by mouth every 6 (six) hours as needed for nausea or vomiting. 20 tablet 0  . oxyCODONE (OXY IR/ROXICODONE) 5 MG immediate release tablet Take 1-2 tablets (5-10 mg total) by mouth every 4 (four) hours as needed for moderate pain. (Patient not taking: Reported on 07/18/2016) 30 tablet 0  . Testosterone (ANDROGEL) 20.25 MG/1.25GM (1.62%) GEL Place 1-2 application onto the skin daily. Apply 1-2 squirts to each shoulder    . traZODone (DESYREL) 150 MG tablet Take 150 mg by mouth at bedtime.     No current facility-administered medications on file prior to visit.     Allergies  Allergen Reactions   . 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 Niacin And Related Other (See Comments)    unknown

## 2016-08-18 NOTE — Telephone Encounter (Signed)
Ok      Zpak   # 6, 2 today then one daily              Tussionex 140 ml    5 ml every 12 hours as needed for cough

## 2016-08-18 NOTE — Telephone Encounter (Signed)
Spoke with pt's wife, Gillis Ends. She is aware of CY's response. Rxs have been sent/printed. Nothing further was needed.

## 2016-10-14 ENCOUNTER — Other Ambulatory Visit: Payer: Self-pay | Admitting: Orthopedic Surgery

## 2016-10-18 ENCOUNTER — Encounter (HOSPITAL_BASED_OUTPATIENT_CLINIC_OR_DEPARTMENT_OTHER): Payer: Self-pay | Admitting: *Deleted

## 2016-10-20 ENCOUNTER — Encounter (HOSPITAL_BASED_OUTPATIENT_CLINIC_OR_DEPARTMENT_OTHER)
Admission: RE | Disposition: A | Discharge status: Home / Self Care | Payer: Self-pay | Source: Ambulatory Visit | Attending: Orthopedic Surgery

## 2016-10-20 ENCOUNTER — Ambulatory Visit (HOSPITAL_BASED_OUTPATIENT_CLINIC_OR_DEPARTMENT_OTHER): Payer: BLUE CROSS/BLUE SHIELD | Admitting: Anesthesiology

## 2016-10-20 ENCOUNTER — Ambulatory Visit (HOSPITAL_BASED_OUTPATIENT_CLINIC_OR_DEPARTMENT_OTHER)
Admission: RE | Admit: 2016-10-20 | Discharge: 2016-10-20 | Disposition: A | Discharge status: Home / Self Care | Payer: BLUE CROSS/BLUE SHIELD | Source: Ambulatory Visit | Attending: Orthopedic Surgery | Admitting: Orthopedic Surgery

## 2016-10-20 ENCOUNTER — Encounter (HOSPITAL_BASED_OUTPATIENT_CLINIC_OR_DEPARTMENT_OTHER): Payer: Self-pay

## 2016-10-20 DIAGNOSIS — E7800 Pure hypercholesterolemia, unspecified: Secondary | ICD-10-CM | POA: Diagnosis not present

## 2016-10-20 DIAGNOSIS — J45909 Unspecified asthma, uncomplicated: Secondary | ICD-10-CM | POA: Diagnosis not present

## 2016-10-20 DIAGNOSIS — I2510 Atherosclerotic heart disease of native coronary artery without angina pectoris: Secondary | ICD-10-CM | POA: Insufficient documentation

## 2016-10-20 DIAGNOSIS — I252 Old myocardial infarction: Secondary | ICD-10-CM | POA: Insufficient documentation

## 2016-10-20 DIAGNOSIS — Z951 Presence of aortocoronary bypass graft: Secondary | ICD-10-CM | POA: Insufficient documentation

## 2016-10-20 DIAGNOSIS — K219 Gastro-esophageal reflux disease without esophagitis: Secondary | ICD-10-CM | POA: Insufficient documentation

## 2016-10-20 DIAGNOSIS — I739 Peripheral vascular disease, unspecified: Secondary | ICD-10-CM | POA: Diagnosis not present

## 2016-10-20 DIAGNOSIS — Z87891 Personal history of nicotine dependence: Secondary | ICD-10-CM | POA: Diagnosis not present

## 2016-10-20 DIAGNOSIS — M24111 Other articular cartilage disorders, right shoulder: Secondary | ICD-10-CM | POA: Diagnosis not present

## 2016-10-20 DIAGNOSIS — Z7982 Long term (current) use of aspirin: Secondary | ICD-10-CM | POA: Insufficient documentation

## 2016-10-20 DIAGNOSIS — M7541 Impingement syndrome of right shoulder: Secondary | ICD-10-CM | POA: Diagnosis not present

## 2016-10-20 DIAGNOSIS — M19011 Primary osteoarthritis, right shoulder: Secondary | ICD-10-CM | POA: Diagnosis not present

## 2016-10-20 DIAGNOSIS — E1122 Type 2 diabetes mellitus with diabetic chronic kidney disease: Secondary | ICD-10-CM | POA: Diagnosis not present

## 2016-10-20 DIAGNOSIS — Z7984 Long term (current) use of oral hypoglycemic drugs: Secondary | ICD-10-CM | POA: Insufficient documentation

## 2016-10-20 DIAGNOSIS — M75111 Incomplete rotator cuff tear or rupture of right shoulder, not specified as traumatic: Secondary | ICD-10-CM

## 2016-10-20 DIAGNOSIS — I129 Hypertensive chronic kidney disease with stage 1 through stage 4 chronic kidney disease, or unspecified chronic kidney disease: Secondary | ICD-10-CM | POA: Insufficient documentation

## 2016-10-20 DIAGNOSIS — Z880 Allergy status to penicillin: Secondary | ICD-10-CM | POA: Insufficient documentation

## 2016-10-20 DIAGNOSIS — M75101 Unspecified rotator cuff tear or rupture of right shoulder, not specified as traumatic: Secondary | ICD-10-CM | POA: Diagnosis not present

## 2016-10-20 DIAGNOSIS — N189 Chronic kidney disease, unspecified: Secondary | ICD-10-CM | POA: Diagnosis not present

## 2016-10-20 DIAGNOSIS — S46011A Strain of muscle(s) and tendon(s) of the rotator cuff of right shoulder, initial encounter: Secondary | ICD-10-CM | POA: Diagnosis not present

## 2016-10-20 DIAGNOSIS — G8918 Other acute postprocedural pain: Secondary | ICD-10-CM | POA: Diagnosis not present

## 2016-10-20 HISTORY — DX: Presence of aortocoronary bypass graft: Z95.1

## 2016-10-20 HISTORY — PX: RESECTION DISTAL CLAVICAL: SHX5053

## 2016-10-20 HISTORY — PX: SHOULDER ARTHROSCOPY WITH ROTATOR CUFF REPAIR AND SUBACROMIAL DECOMPRESSION: SHX5686

## 2016-10-20 HISTORY — DX: Incomplete rotator cuff tear or rupture of right shoulder, not specified as traumatic: M75.111

## 2016-10-20 LAB — POCT I-STAT, CHEM 8
BUN: 22 mg/dL — ABNORMAL HIGH (ref 6–20)
Calcium, Ion: 1.1 mmol/L — ABNORMAL LOW (ref 1.15–1.40)
Chloride: 105 mmol/L (ref 101–111)
Creatinine, Ser: 0.9 mg/dL (ref 0.61–1.24)
Glucose, Bld: 139 mg/dL — ABNORMAL HIGH (ref 65–99)
HCT: 45 % (ref 39.0–52.0)
Hemoglobin: 15.3 g/dL (ref 13.0–17.0)
Potassium: 4.3 mmol/L (ref 3.5–5.1)
Sodium: 139 mmol/L (ref 135–145)
TCO2: 26 mmol/L (ref 0–100)

## 2016-10-20 LAB — GLUCOSE, CAPILLARY: Glucose-Capillary: 126 mg/dL — ABNORMAL HIGH (ref 65–99)

## 2016-10-20 SURGERY — SHOULDER ARTHROSCOPY WITH ROTATOR CUFF REPAIR AND SUBACROMIAL DECOMPRESSION
Anesthesia: General | Site: Shoulder | Laterality: Right

## 2016-10-20 MED ORDER — SUCCINYLCHOLINE CHLORIDE 200 MG/10ML IV SOSY
PREFILLED_SYRINGE | INTRAVENOUS | Status: AC
  Filled 2016-10-20: qty 10

## 2016-10-20 MED ORDER — FENTANYL CITRATE (PF) 100 MCG/2ML IJ SOLN
INTRAMUSCULAR | Status: AC
Start: 2016-10-20 — End: 2016-10-20
  Filled 2016-10-20: qty 2

## 2016-10-20 MED ORDER — SCOPOLAMINE 1 MG/3DAYS TD PT72: 1.0000 | MEDICATED_PATCH | Freq: Once | TRANSDERMAL | Status: DC | PRN

## 2016-10-20 MED ORDER — LIDOCAINE HCL (CARDIAC) 20 MG/ML IV SOLN
INTRAVENOUS | Status: DC | PRN
  Administered 2016-10-20: 50 mg via INTRAVENOUS

## 2016-10-20 MED ORDER — ONDANSETRON HCL 4 MG PO TABS: 4.0000 mg | ORAL_TABLET | Freq: Three times a day (TID) | ORAL | 0 refills | Status: DC | PRN

## 2016-10-20 MED ORDER — HYDROMORPHONE HCL 1 MG/ML IJ SOLN: 0.2500 mg | INTRAMUSCULAR | Status: DC | PRN

## 2016-10-20 MED ORDER — LACTATED RINGERS IV SOLN
INTRAVENOUS | Status: DC
  Administered 2016-10-20: 07:00:00 via INTRAVENOUS

## 2016-10-20 MED ORDER — CEFAZOLIN SODIUM-DEXTROSE 2-4 GM/100ML-% IV SOLN
INTRAVENOUS | Status: AC
  Filled 2016-10-20: qty 100

## 2016-10-20 MED ORDER — SENNA-DOCUSATE SODIUM 8.6-50 MG PO TABS: 2.0000 | ORAL_TABLET | Freq: Every day | ORAL | 1 refills | Status: AC

## 2016-10-20 MED ORDER — OXYCODONE HCL 5 MG/5ML PO SOLN: 5.0000 mg | Freq: Once | ORAL | Status: DC | PRN

## 2016-10-20 MED ORDER — LIDOCAINE 2% (20 MG/ML) 5 ML SYRINGE
INTRAMUSCULAR | Status: AC
  Filled 2016-10-20: qty 5

## 2016-10-20 MED ORDER — PROPOFOL 500 MG/50ML IV EMUL
INTRAVENOUS | Status: AC
  Filled 2016-10-20: qty 50

## 2016-10-20 MED ORDER — PROPOFOL 10 MG/ML IV BOLUS
INTRAVENOUS | Status: DC | PRN
  Administered 2016-10-20: 50 mg via INTRAVENOUS
  Administered 2016-10-20: 150 mg via INTRAVENOUS

## 2016-10-20 MED ORDER — OXYCODONE HCL 5 MG PO TABS: 5.0000 mg | ORAL_TABLET | Freq: Once | ORAL | Status: DC | PRN

## 2016-10-20 MED ORDER — CEFAZOLIN SODIUM-DEXTROSE 2-4 GM/100ML-% IV SOLN
2.0000 g | INTRAVENOUS | Status: AC
  Administered 2016-10-20: 2 g via INTRAVENOUS

## 2016-10-20 MED ORDER — PROMETHAZINE HCL 25 MG/ML IJ SOLN: 6.2500 mg | INTRAMUSCULAR | Status: DC | PRN

## 2016-10-20 MED ORDER — SUCCINYLCHOLINE CHLORIDE 20 MG/ML IJ SOLN
INTRAMUSCULAR | Status: DC | PRN
  Administered 2016-10-20: 100 mg via INTRAVENOUS

## 2016-10-20 MED ORDER — ONDANSETRON HCL 4 MG/2ML IJ SOLN
INTRAMUSCULAR | Status: AC
  Filled 2016-10-20: qty 2

## 2016-10-20 MED ORDER — DEXAMETHASONE SODIUM PHOSPHATE 10 MG/ML IJ SOLN
INTRAMUSCULAR | Status: AC
  Filled 2016-10-20: qty 1

## 2016-10-20 MED ORDER — BACLOFEN 10 MG PO TABS: 10.0000 mg | ORAL_TABLET | Freq: Three times a day (TID) | ORAL | 0 refills | Status: DC

## 2016-10-20 MED ORDER — ARTIFICIAL TEARS OP OINT
TOPICAL_OINTMENT | OPHTHALMIC | Status: AC
  Filled 2016-10-20: qty 3.5

## 2016-10-20 MED ORDER — DEXAMETHASONE SODIUM PHOSPHATE 4 MG/ML IJ SOLN
INTRAMUSCULAR | Status: DC | PRN
  Administered 2016-10-20: 10 mg via INTRAVENOUS

## 2016-10-20 MED ORDER — MIDAZOLAM HCL 2 MG/2ML IJ SOLN
INTRAMUSCULAR | Status: AC
  Filled 2016-10-20: qty 2

## 2016-10-20 MED ORDER — FENTANYL CITRATE (PF) 100 MCG/2ML IJ SOLN
50.0000 ug | INTRAMUSCULAR | Status: DC | PRN
  Administered 2016-10-20: 100 ug via INTRAVENOUS
  Administered 2016-10-20: 50 ug via INTRAVENOUS

## 2016-10-20 MED ORDER — MIDAZOLAM HCL 2 MG/2ML IJ SOLN
1.0000 mg | INTRAMUSCULAR | Status: DC | PRN
  Administered 2016-10-20: 2 mg via INTRAVENOUS

## 2016-10-20 MED ORDER — OXYCODONE-ACETAMINOPHEN 10-325 MG PO TABS: 1.0000 | ORAL_TABLET | Freq: Four times a day (QID) | ORAL | 0 refills | Status: DC | PRN

## 2016-10-20 SURGICAL SUPPLY — 70 items
ANCH SUT SWLK 19.1X4.75 (Anchor) ×2 IMPLANT
ANCHOR SUT BIO SW 4.75X19.1 (Anchor) ×2 IMPLANT
BLADE CUTTER GATOR 3.5 (BLADE) ×3 IMPLANT
BLADE GREAT WHITE 4.2 (BLADE) IMPLANT
BLADE SURG 15 STRL LF DISP TIS (BLADE) IMPLANT
BLADE SURG 15 STRL SS (BLADE)
BUR OVAL 6.0 (BURR) ×2 IMPLANT
CANNULA 5.75X71 LONG (CANNULA) ×3 IMPLANT
CANNULA TWIST IN 8.25X7CM (CANNULA) ×4 IMPLANT
CLSR STERI-STRIP ANTIMIC 1/2X4 (GAUZE/BANDAGES/DRESSINGS) ×3 IMPLANT
CUTTER MENISCUS  4.2MM (BLADE)
CUTTER MENISCUS 4.2MM (BLADE) IMPLANT
DECANTER SPIKE VIAL GLASS SM (MISCELLANEOUS) IMPLANT
DRAPE IMP U-DRAPE 54X76 (DRAPES) ×3 IMPLANT
DRAPE INCISE IOBAN 66X45 STRL (DRAPES) ×3 IMPLANT
DRAPE SHOULDER BEACH CHAIR (DRAPES) ×3 IMPLANT
DRAPE U-SHAPE 47X51 STRL (DRAPES) ×3 IMPLANT
DRSG PAD ABDOMINAL 8X10 ST (GAUZE/BANDAGES/DRESSINGS) ×3 IMPLANT
DURAPREP 26ML APPLICATOR (WOUND CARE) ×3 IMPLANT
ELECT REM PT RETURN 9FT ADLT (ELECTROSURGICAL) ×3
ELECTRODE REM PT RTRN 9FT ADLT (ELECTROSURGICAL) ×1 IMPLANT
FIBERSTICK 2 (SUTURE) IMPLANT
GAUZE SPONGE 4X4 12PLY STRL (GAUZE/BANDAGES/DRESSINGS) ×3 IMPLANT
GLOVE BIO SURGEON STRL SZ8 (GLOVE) ×3 IMPLANT
GLOVE BIOGEL M STRL SZ7.5 (GLOVE) ×4 IMPLANT
GLOVE BIOGEL PI IND STRL 8 (GLOVE) ×5 IMPLANT
GLOVE BIOGEL PI INDICATOR 8 (GLOVE) ×3
GLOVE ORTHO TXT STRL SZ7.5 (GLOVE) ×3 IMPLANT
GOWN STRL REUS W/ TWL LRG LVL3 (GOWN DISPOSABLE) ×2 IMPLANT
GOWN STRL REUS W/ TWL XL LVL3 (GOWN DISPOSABLE) ×5 IMPLANT
GOWN STRL REUS W/TWL LRG LVL3 (GOWN DISPOSABLE) ×3
GOWN STRL REUS W/TWL XL LVL3 (GOWN DISPOSABLE) ×9
IMMOBILIZER SHOULDER FOAM XLGE (SOFTGOODS) IMPLANT
KIT BIO-TENODESIS 3X8 DISP (MISCELLANEOUS)
KIT INSRT BABSR STRL DISP BTN (MISCELLANEOUS) IMPLANT
KIT SHOULDER TRACTION (DRAPES) ×3 IMPLANT
LASSO 90 CVE QUICKPAS (DISPOSABLE) IMPLANT
MANIFOLD NEPTUNE II (INSTRUMENTS) ×3 IMPLANT
NDL SCORPION MULTI FIRE (NEEDLE) IMPLANT
NDL SUT 6 .5 CRC .975X.05 MAYO (NEEDLE) IMPLANT
NEEDLE MAYO TAPER (NEEDLE)
NEEDLE SCORPION MULTI FIRE (NEEDLE) ×3 IMPLANT
PACK ARTHROSCOPY DSU (CUSTOM PROCEDURE TRAY) ×3 IMPLANT
PACK BASIN DAY SURGERY FS (CUSTOM PROCEDURE TRAY) ×3 IMPLANT
PENCIL BUTTON HOLSTER BLD 10FT (ELECTRODE) ×1 IMPLANT
PROBE BIPOLAR ATHRO 135MM 90D (MISCELLANEOUS) ×3 IMPLANT
SET ARTHROSCOPY TUBING (MISCELLANEOUS) ×3
SET ARTHROSCOPY TUBING LN (MISCELLANEOUS) ×2 IMPLANT
SHEET MEDIUM DRAPE 40X70 STRL (DRAPES) ×3 IMPLANT
SLEEVE SCD COMPRESS KNEE MED (MISCELLANEOUS) ×3 IMPLANT
SLING ARM FOAM STRAP LRG (SOFTGOODS) ×2 IMPLANT
SLING ARM IMMOBILIZER LRG (SOFTGOODS) ×2 IMPLANT
SLING ARM IMMOBILIZER MED (SOFTGOODS) IMPLANT
SLING ARM MED ADULT FOAM STRAP (SOFTGOODS) IMPLANT
SLING ARM XL FOAM STRAP (SOFTGOODS) IMPLANT
SPONGE LAP 4X18 X RAY DECT (DISPOSABLE) ×3 IMPLANT
SUT FIBERWIRE #2 38 T-5 BLUE (SUTURE)
SUT MNCRL AB 4-0 PS2 18 (SUTURE) ×3 IMPLANT
SUT PDS AB 1 CT  36 (SUTURE)
SUT PDS AB 1 CT 36 (SUTURE) IMPLANT
SUT TIGER TAPE 7 IN WHITE (SUTURE) ×2 IMPLANT
SUT VIC AB 3-0 SH 27 (SUTURE)
SUT VIC AB 3-0 SH 27X BRD (SUTURE) IMPLANT
SUT VICRYL 3-0 CR8 SH (SUTURE) ×1 IMPLANT
SUTURE FIBERWR #2 38 T-5 BLUE (SUTURE) IMPLANT
TAPE FIBER 2MM 7IN #2 BLUE (SUTURE) ×2 IMPLANT
TOWEL OR 17X24 6PK STRL BLUE (TOWEL DISPOSABLE) ×3 IMPLANT
TOWEL OR NON WOVEN STRL DISP B (DISPOSABLE) ×3 IMPLANT
TUBE CONNECTING 20X1/4 (TUBING) IMPLANT
WATER STERILE IRR 1000ML POUR (IV SOLUTION) ×3 IMPLANT

## 2016-10-20 NOTE — Anesthesia Preprocedure Evaluation (Addendum)
Anesthesia Evaluation  Patient identified by MRN, date of birth, ID band Patient awake    Reviewed: Allergy & Precautions, NPO status , Patient's Chart, lab work & pertinent test results, reviewed documented beta blocker date and time   Airway Mallampati: II  TM Distance: >3 FB Neck ROM: Full    Dental no notable dental hx. (+) Edentulous Upper, Edentulous Lower, Poor Dentition, Missing, Upper Dentures, Partial Lower, Dental Advisory Given   Pulmonary neg pulmonary ROS, asthma , former smoker,    Pulmonary exam normal breath sounds clear to auscultation       Cardiovascular hypertension, + CAD, + Past MI and + Peripheral Vascular Disease  negative cardio ROS Normal cardiovascular exam Rhythm:Regular Rate:Normal     Neuro/Psych negative neurological ROS  negative psych ROS   GI/Hepatic negative GI ROS, Neg liver ROS, GERD  ,  Endo/Other  negative endocrine ROSdiabetes, Type 2, Oral Hypoglycemic Agents  Renal/GU Renal InsufficiencyRenal diseasenegative Renal ROS  negative genitourinary   Musculoskeletal negative musculoskeletal ROS (+) Arthritis , Osteoarthritis,    Abdominal   Peds negative pediatric ROS (+)  Hematology negative hematology ROS (+)   Anesthesia Other Findings   Reproductive/Obstetrics negative OB ROS                            Anesthesia Physical Anesthesia Plan  ASA: III  Anesthesia Plan: General   Post-op Pain Management: GA combined w/ Regional for post-op pain   Induction: Intravenous  Airway Management Planned: Oral ETT  Additional Equipment:   Intra-op Plan:   Post-operative Plan: Extubation in OR  Informed Consent: I have reviewed the patients History and Physical, chart, labs and discussed the procedure including the risks, benefits and alternatives for the proposed anesthesia with the patient or authorized representative who has indicated his/her  understanding and acceptance.   Dental advisory given  Plan Discussed with:   Anesthesia Plan Comments:         Anesthesia Quick Evaluation

## 2016-10-20 NOTE — Progress Notes (Signed)
Assisted Dr. Miller with right, ultrasound guided, supraclavicular block. Side rails up, monitors on throughout procedure. See vital signs in flow sheet. Tolerated Procedure well. 
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
## 2016-10-20 NOTE — Anesthesia Postprocedure Evaluation (Signed)
Anesthesia Post Note  Patient: [name]  Procedure(s) Performed: Procedure(s) (LRB): SHOULDER ARTHROSCOPY WITH SUBACROMIAL DECOMPRESSION, ROTATOR CUFF REPAIR (Right) DISTAL CLAVICLE EXCISION (Right)  Patient location during evaluation: PACU Anesthesia Type: General Level of consciousness: awake and alert Pain management: pain level controlled Vital Signs Assessment: post-procedure vital signs reviewed and stable Respiratory status: spontaneous breathing, nonlabored ventilation and respiratory function stable Cardiovascular status: blood pressure returned to baseline and stable Postop Assessment: no signs of nausea or vomiting Anesthetic complications: no       Last Vitals:  Vitals:   10/20/16 1016 10/20/16 1032  BP:  129/67  Pulse: 62 (!) 58  Resp: (!) 21 16  Temp:  36.9 C    Last Pain:  Vitals:   10/20/16 1032  TempSrc:   PainSc: 0-No pain                 Lowella Curb

## 2016-10-20 NOTE — Anesthesia Procedure Notes (Signed)
Procedure Name: Intubation Performed by: York Grice Pre-anesthesia Checklist: Patient identified, Emergency Drugs available, Suction available and Patient being monitored Patient Re-evaluated:Patient Re-evaluated prior to inductionOxygen Delivery Method: Circle system utilized Preoxygenation: Pre-oxygenation with 100% oxygen Intubation Type: IV induction Ventilation: Mask ventilation without difficulty Laryngoscope Size: Miller and 2 Grade View: Grade I Tube type: Oral Tube size: 7.0 mm Number of attempts: 1 Airway Equipment and Method: Stylet and Oral airway Placement Confirmation: ETT inserted through vocal cords under direct vision,  positive ETCO2 and breath sounds checked- equal and bilateral Secured at: 21 cm Tube secured with: Tape Dental Injury: Teeth and Oropharynx as per pre-operative assessment and Injury to lip  Comments: Lips dry pre op.  Right corner of lips split with opening mouth and intubation.

## 2016-10-20 NOTE — Transfer of Care (Signed)
Immediate Anesthesia Transfer of Care Note  Patient: [name]  Procedure(s) Performed: Procedure(s): SHOULDER ARTHROSCOPY WITH SUBACROMIAL DECOMPRESSION, ROTATOR CUFF REPAIR (Right) DISTAL CLAVICLE EXCISION (Right)  Patient Location: PACU  Anesthesia Type:General  Level of Consciousness: awake and sedated  Airway & Oxygen Therapy: Patient Spontanous Breathing and Patient connected to face mask oxygen  Post-op Assessment: Report given to RN and Post -op Vital signs reviewed and stable  Post vital signs: Reviewed and stable  Last Vitals:  Vitals:   10/20/16 0720 10/20/16 0928  BP:    Pulse: 62 72  Resp: 17 20  Temp:      Last Pain:  Vitals:   10/20/16 0629  TempSrc: Oral  PainSc: 6       Patients Stated Pain Goal: 2 (10/20/16 0629)  Complications: No apparent anesthesia complications

## 2016-10-20 NOTE — Anesthesia Procedure Notes (Signed)
Anesthesia Regional Block: Interscalene brachial plexus block   Pre-Anesthetic Checklist: ,, timeout performed, Correct Patient, Correct Site, Correct Laterality, Correct Procedure, Correct Position, site marked, Risks and benefits discussed,  Surgical consent,  Pre-op evaluation,  At surgeon's request and post-op pain management  Laterality: Right  Prep: chloraprep       Needles:  Injection technique: Single-shot  Needle Type: Stimiplex     Needle Length: 9cm  Needle Gauge: 21     Additional Needles:   Procedures: ultrasound guided,,,,,,,,  Narrative:  Start time: 10/20/2016 7:07 AM End time: 10/20/2016 7:17 AM  Performed by: Personally  Anesthesiologist: Anitra Lauth RAY

## 2016-10-20 NOTE — Op Note (Signed)
10/20/2016  9:20 AM  PATIENT:  [name]    PRE-OPERATIVE DIAGNOSIS:  Right shoulder pain  POST-OPERATIVE DIAGNOSIS:  Right shoulder high-grade partial supraspinatus tear, with impingement syndrome, before meals joint arthrosis.  PROCEDURE:  Right shoulder arthroscopy with limited debridement of the labrum, bursectomy, CA ligament release, acromioplasty, supraspinatus take down and repair of the rotator cuff, with distal clavicle resection.  SURGEON:  Eulas Post, MD  PHYSICIAN ASSISTANT: Janace Litten, OPA-C, present and scrubbed throughout the case, critical for completion in a timely fashion, and for retraction, instrumentation, and closure.  Second assistant: Danielle Rankin, orthopedic PA-C  ANESTHESIA:   General with regional block  PREOPERATIVE INDICATIONS:  [name] is a  61 y.o. male who had persistent chronic right shoulder pain. He failed conservative measures. MRI did not demonstrate significant rotator cuff tear, however he's had contralateral shoulder surgery and did well and wishes to have the same procedure done, and felt like something was definitely inside.  The risks benefits and alternatives were discussed with the patient preoperatively including but not limited to the risks of infection, bleeding, nerve injury, cardiopulmonary complications, the need for revision surgery, among others, and the patient was willing to proceed. We also discussed the potential for cuff repair, recurrent tear, among others.  ESTIMATED BLOOD LOSS: Minimal  OPERATIVE IMPLANTS: Arthrex bio composite 4.75 mm swivel lock 1 with an inverted fiber wire  OPERATIVE FINDINGS: The shoulder had full motion during examination under anesthesia, and no instability. The glenohumeral joint was remarkably in good condition, there is a little bit of labral fraying inferiorly, superiorly was all intact and the biceps was intact and the subscapularis was intact infraspinatus was also intact from  below, supraspinatus had some 5% fraying from below.  In the subacromial space there was a fairly substantial amount of bursal sided cuff tearing, probably at least 70%, although the capsule was still intact but the tendon was disconnected laterally from above. The CA ligament had moderate fraying. The undersurface of the acromion was between a type I and type II, not significantly spurred, but enough to want acromioplasty. There was mild to moderate arthrosis of the acromioclavicular joint. The tendon quality was reasonably good although the tendon was somewhat thin, the bone quality was also good.  OPERATIVE PROCEDURE: The patient is brought to the operating room and placed in supine position. Gen. anesthesia was administered. Time out was performed. He was turned in the semilateral decubitus position and all bony prominences padded. A sterile prep and drape was performed and the arm was suspended in 15 pounds of traction.  Diagnostic arthroscopy was carried out the above-named findings. I used the arthroscopic shaver to debride a small area of the inferior labrum, as well as a small area of the undersurface of the supraspinatus.  I went to the subacromial space, and closely inspected the supraspinatus and found that there was a respectable-sized high-grade tear from the bursal side. I released the CA ligament, performed a light acromioplasty, and used a spatula to release the remaining fibers that was really the majority of the capsule, not really the tendon that was still attached on the supraspinatus. Once I had a path for the scorpion suture passer, I performed a light tubercleplasty with the bur to prepare a bleeding healing surface for the tendon.  Lateral and superior cannulas were placed, and I used the scorpion suture passer to pass an inverted fiber tape.  This is brought laterally to the apex of the tuberosity and  taken down into a single swivel lock, with excellent fixation.  I then  turned my attention to the distal clavicle and resected approximately 10 mm of bone, confirmed from multiple portals.  The instruments were removed, the portals closed with Monocryl followed by Steri-Strips and sterile gauze. He was awakened and returned to PACU in stable and satisfactory condition. There were no complications and he tolerated the procedure well.

## 2016-10-20 NOTE — Discharge Instructions (Signed)
Regional Anesthesia Blocks  1. Numbness or the inability to move the "blocked" extremity Woldt last from 3-48 hours after placement. The length of time depends on the medication injected and your individual response to the medication. If the numbness is not going away after 48 hours, call your surgeon.  2. The extremity that is blocked will need to be protected until the numbness is gone and the  Strength has returned. Because you cannot feel it, you will need to take extra care to avoid injury. Because it Dillie be weak, you Vaughn have difficulty moving it or using it. You Stranahan not know what position it is in without looking at it while the block is in effect.  3. For blocks in the legs and feet, returning to weight bearing and walking needs to be done carefully. You will need to wait until the numbness is entirely gone and the strength has returned. You should be able to move your leg and foot normally before you try and bear weight or walk. You will need someone to be with you when you first try to ensure you do not fall and possibly risk injury.  4. Bruising and tenderness at the needle site are common side effects and will resolve in a few days.  5. Persistent numbness or new problems with movement should be communicated to the surgeon or the [facility] [phone] [facility] [phone]).   Post Anesthesia Home Care Instructions  Activity: Get plenty of rest for the remainder of the day. A responsible adult should stay with you for 24 hours following the procedure.  For the next 24 hours, DO NOT: -Drive a car -Advertising copywriter -Drink alcoholic beverages -Take any medication unless instructed by your physician -Make any legal decisions or sign important papers.  Meals: Start with liquid foods such as gelatin or soup. Progress to regular foods as tolerated. Avoid greasy, spicy, heavy foods. If nausea and/or vomiting occur, drink only clear liquids until the  nausea and/or vomiting subsides. Call your physician if vomiting continues.  Special Instructions/Symptoms: Your throat Cataldo feel dry or sore from the anesthesia or the breathing tube placed in your throat during surgery. If this causes discomfort, gargle with warm salt water. The discomfort should disappear within 24 hours.  If you had a scopolamine patch placed behind your ear for the management of post- operative nausea and/or vomiting:  1. The medication in the patch is effective for 72 hours, after which it should be removed.  Wrap patch in a tissue and discard in the trash. Wash hands thoroughly with soap and water. 2. You Brummell remove the patch earlier than 72 hours if you experience unpleasant side effects which Rosenfield include dry mouth, dizziness or visual disturbances. 3. Avoid touching the patch. Wash your hands with soap and water after contact with the patch.    Call your surgeon if you experience:   1.  Fever over 101.0. 2.  Inability to urinate. 3.  Nausea and/or vomiting. 4.  Extreme swelling or bruising at the surgical site. 5.  Continued bleeding from the incision. 6.  Increased pain, redness or drainage from the incision. 7.  Problems related to your pain medication. 8.  Any problems and/or concerns  Diet: As you were doing prior to hospitalization   Shower:  Dunton shower but keep the wounds dry, use an occlusive plastic wrap, NO SOAKING IN TUB.  If the bandage gets wet, change with a clean dry gauze.  If  you have a splint on, leave the splint in place and keep the splint dry with a plastic bag.  Dressing:  You Mainville change your dressing 3-5 days after surgery, unless you have a splint.  If you have a splint, then just leave the splint in place and we will change your bandages during your first follow-up appointment.    If you had hand or foot surgery, we will plan to remove your stitches in about 2 weeks in the office.  For all other surgeries, there are sticky tapes  (steri-strips) on your wounds and all the stitches are absorbable.  Leave the steri-strips in place when changing your dressings, they will peel off with time, usually 2-3 weeks.  Activity:  Increase activity slowly as tolerated, but follow the weight bearing instructions below.  The rules on driving is that you can not be taking narcotics while you drive, and you must feel in control of the vehicle.    Weight Bearing:   Sling as tolerated.    To prevent constipation: you Giuliani use a stool softener such as -  Colace (over the counter) 100 mg by mouth twice a day  Drink plenty of fluids (prune juice Juneau be helpful) and high fiber foods Miralax (over the counter) for constipation as needed.    Itching:  If you experience itching with your medications, try taking only a single pain pill, or even half a pain pill at a time.  You Siegert take up to 10 pain pills per day, and you can also use benadryl over the counter for itching or also to help with sleep.   Precautions:  If you experience chest pain or shortness of breath - call 911 immediately for transfer to the hospital emergency department!!  If you develop a fever greater that 101 F, purulent drainage from wound, increased redness or drainage from wound, or calf pain -- Call the office at [phone]                                                Follow- Up Appointment:  Please call for an appointment to be seen in 2 weeks [place] - [phone]

## 2016-10-21 ENCOUNTER — Encounter (HOSPITAL_BASED_OUTPATIENT_CLINIC_OR_DEPARTMENT_OTHER): Payer: Self-pay | Admitting: Orthopedic Surgery

## 2016-11-02 DIAGNOSIS — M7541 Impingement syndrome of right shoulder: Secondary | ICD-10-CM | POA: Diagnosis not present

## 2016-11-24 ENCOUNTER — Telehealth: Payer: Self-pay | Admitting: Internal Medicine

## 2016-11-24 MED ORDER — AZITHROMYCIN 250 MG PO TABS: ORAL_TABLET | ORAL | 0 refills | Status: DC

## 2016-11-24 NOTE — Telephone Encounter (Signed)
Spoke with patient's wife-states patient is having nasal congestion colored and would like to have Zpak sent to pharmacy. Per CY okay to give Zpak # 1 take as directed no refills. Pt's wife is aware of rx sent and nothing more needed at this time.

## 2016-12-05 DIAGNOSIS — S46011D Strain of muscle(s) and tendon(s) of the rotator cuff of right shoulder, subsequent encounter: Secondary | ICD-10-CM | POA: Diagnosis not present

## 2016-12-11 ENCOUNTER — Other Ambulatory Visit: Payer: Self-pay | Admitting: Internal Medicine

## 2017-01-04 DIAGNOSIS — M7541 Impingement syndrome of right shoulder: Secondary | ICD-10-CM | POA: Diagnosis not present

## 2017-01-04 DIAGNOSIS — S46011D Strain of muscle(s) and tendon(s) of the rotator cuff of right shoulder, subsequent encounter: Secondary | ICD-10-CM | POA: Diagnosis not present

## 2017-01-11 ENCOUNTER — Encounter: Payer: Self-pay | Admitting: Family

## 2017-01-16 ENCOUNTER — Ambulatory Visit: Payer: BLUE CROSS/BLUE SHIELD | Admitting: Family

## 2017-01-16 ENCOUNTER — Ambulatory Visit (HOSPITAL_COMMUNITY): Payer: BLUE CROSS/BLUE SHIELD

## 2017-02-01 DIAGNOSIS — Z951 Presence of aortocoronary bypass graft: Secondary | ICD-10-CM | POA: Diagnosis not present

## 2017-02-01 DIAGNOSIS — E785 Hyperlipidemia, unspecified: Secondary | ICD-10-CM | POA: Diagnosis not present

## 2017-02-01 DIAGNOSIS — I2510 Atherosclerotic heart disease of native coronary artery without angina pectoris: Secondary | ICD-10-CM | POA: Diagnosis not present

## 2017-02-01 DIAGNOSIS — I25719 Atherosclerosis of autologous vein coronary artery bypass graft(s) with unspecified angina pectoris: Secondary | ICD-10-CM | POA: Diagnosis not present

## 2017-02-14 ENCOUNTER — Ambulatory Visit: Payer: BLUE CROSS/BLUE SHIELD | Admitting: Internal Medicine

## 2017-02-15 ENCOUNTER — Ambulatory Visit (INDEPENDENT_AMBULATORY_CARE_PROVIDER_SITE_OTHER): Payer: BLUE CROSS/BLUE SHIELD | Admitting: Internal Medicine

## 2017-02-15 ENCOUNTER — Encounter: Payer: Self-pay | Admitting: Internal Medicine

## 2017-02-15 DIAGNOSIS — J4520 Mild intermittent asthma, uncomplicated: Secondary | ICD-10-CM | POA: Diagnosis not present

## 2017-02-15 DIAGNOSIS — S46011D Strain of muscle(s) and tendon(s) of the rotator cuff of right shoulder, subsequent encounter: Secondary | ICD-10-CM | POA: Diagnosis not present

## 2017-02-15 DIAGNOSIS — F5101 Primary insomnia: Secondary | ICD-10-CM | POA: Diagnosis not present

## 2017-02-15 DIAGNOSIS — M7541 Impingement syndrome of right shoulder: Secondary | ICD-10-CM | POA: Diagnosis not present

## 2017-02-15 MED ORDER — SUVOREXANT 20 MG PO TABS: 20.0000 mg | ORAL_TABLET | Freq: Every day | ORAL | 5 refills | Status: DC

## 2017-02-15 NOTE — Assessment & Plan Note (Signed)
Chronic trazodone. We discussed sleep hygiene which is important because he sits around in the daytime, allowing him to drift off during the day and be less sleepy at night. Plan-try replacing trazodone with belsomra.

## 2017-02-15 NOTE — Progress Notes (Signed)
HPI male former smoker followed for asthma/bronchitis, allergic rhinitis, insomnia, complicated by CAD/CABG/ ACDF, GERD Office Spirometry 07/07/2015-mild restriction consistent with his abdominal obesity. FVC 3.28/74%, FEV1 2.71/78%, FEV1/FVC 0.83 ----------------------------------------------------------------------------------------------  02/15/2016-61 year old male former smoker followed for asthma/bronchitis, allergic rhinitis, complicated by CAD/CABG/ ACDF, GERD FOLLOWS FOR: Pt states his breathing is doing well overall at this time. Pt will need refills-marked on sheet. Wife here. They both report he is doing quite well now little cough. Using rescue inhaler maybe once a day especially if he is outdoors. Heat and humidity are bothersome. Had repair torn biceps tendon left arm. Needed meds refilled and asked to be able to hold Z-Pak and Tussionex in case needed for acute bronchitis-discussed. CXR 08/04/2015- poststernotomy, NAD, ACDF  02/15/17- 61 year old male former smoker followed for asthma/bronchitis, allergic rhinitis, complicated by CAD/CABG/ ACDF, GERD FOLLOWS FOR: Pt has SOB and wheezing when out in heat. Otherwise doing well. Wife and patient described no new problems. He doesn't bring his comfortably in the hot weather. Insomnia has been more of her problem. Chronic trazodone no longer holds him at night. He often wakes around 1 or 2 AM and stays of the rest of  the night watching TV. He admits he sleeps some during the daytime and we discussed sleep habits.  ROS-HPI Constitutional:   No-   weight loss, night sweats, fevers, chills, fatigue, lassitude. HEENT:   No-  headaches, difficulty swallowing, tooth/dental problems,  No-sore throat,       No-sneezing, itching, ear ache, nasal congestion, no-post nasal drip,  CV:  No-  Anginal  chest pain, orthopnea, PND, swelling in lower extremities, anasarca, dizziness, palpitations Resp: +   shortness of breath with exertion or at rest.               No- productive cough, +-non-productive cough,  No- coughing up of blood.              No-   change in color of mucus.  No- wheezing.   Skin: No-   rash or lesions. GI:  No-heartburn, indigestion, abdominal pain, nausea, vomiting,  GU:  MS: +  joint pain or swelling.  Neuro-     nothing unusual Psych:  No- change in mood or affect. No depression or anxiety.  No memory loss.  OBJ General- Alert, Oriented, Affect-appropriate, Distress- none acute, laconic, + obese Skin- rash-none, lesions- none, excoriation- none Lymphadenopathy- none Head- atraumatic            Eyes- Gross vision intact, PERRLA, conjunctivae clear secretions            Ears- Hearing, canals-normal            Nose-  , no-Septal dev, polyps, erosion, perforation             Throat- Mallampati II-III , mucosa clear , drainage- none, tonsils- atrophic, + edentulous Neck- flexible , trachea midline, no stridor , thyroid nl, carotid no bruit Chest - symmetrical excursion , unlabored           Heart/CV- RRR , no murmur , no gallop  , no rub, nl s1 s2                           - JVD- none , edema- none, stasis changes- none, varices- none           Lung- clear,  dullness-none, rub- none, cough-none, wheeze-none  Chest wall- + sternotomy scar Abd-  Br/ Gen/ Rectal- Not done, not indicated Extrem- cyanosis- none, clubbing, none, atrophy- none, strength- nl Neuro- grossly intact to observation

## 2017-02-15 NOTE — Patient Instructions (Signed)
We can continue present meds  Script printed for Belsomra 20 mg      Try one each night for sleep, instead of Trazodone.   Try to avoid sleeping much in the daytime, if it makes it harder for you to sleep at night.  Please call as needed

## 2017-02-15 NOTE — Assessment & Plan Note (Signed)
Rare need for rescue inhaler. Breathing doesn't seem to wake him. Stable with current management.

## 2017-02-17 ENCOUNTER — Other Ambulatory Visit: Payer: Self-pay | Admitting: Internal Medicine

## 2017-02-21 ENCOUNTER — Other Ambulatory Visit: Payer: Self-pay | Admitting: Internal Medicine

## 2017-02-21 NOTE — Telephone Encounter (Signed)
Pt was last seen by CY on 02/15/17 Pt is requesting that the trazodone be refilled.  CY please advise if you are ok to refill this medication.  thanks

## 2017-02-22 ENCOUNTER — Telehealth: Payer: Self-pay | Admitting: Internal Medicine

## 2017-02-22 ENCOUNTER — Other Ambulatory Visit: Payer: Self-pay | Admitting: Internal Medicine

## 2017-02-22 NOTE — Telephone Encounter (Signed)
Spoke with pt's wife, Gillis Ends (DPR) who states Belsomra 20mg  requires a PA. I have requested that [place] fax PA request, as Gillis Ends works at [employer]. Will hold message in triage until request is received.

## 2017-02-22 NOTE — Telephone Encounter (Signed)
Ok to refill 

## 2017-02-23 NOTE — Telephone Encounter (Signed)
CY Please advise on refill. Pt is current with OV's. Thanks. 

## 2017-02-23 NOTE — Telephone Encounter (Signed)
Ok to refill 

## 2017-02-24 NOTE — Telephone Encounter (Signed)
PA request received.   Initiated PA through [facility].[email or web address], KeyLevora Dredge PA approved from 02/24/17-08/07/2038. Pharmacy and wife aware.  Nothing further needed.

## 2017-02-24 NOTE — Telephone Encounter (Signed)
Checked fax up front, in triage and CY's folder up front. Have not received the PA yet.

## 2017-02-28 ENCOUNTER — Encounter: Payer: Self-pay | Admitting: Family

## 2017-03-06 ENCOUNTER — Other Ambulatory Visit: Payer: Self-pay | Admitting: Orthopedic Surgery

## 2017-03-06 DIAGNOSIS — M25511 Pain in right shoulder: Principal | ICD-10-CM

## 2017-03-06 DIAGNOSIS — G8929 Other chronic pain: Secondary | ICD-10-CM

## 2017-03-20 ENCOUNTER — Ambulatory Visit (HOSPITAL_COMMUNITY): Payer: BLUE CROSS/BLUE SHIELD

## 2017-03-20 ENCOUNTER — Ambulatory Visit: Payer: BLUE CROSS/BLUE SHIELD | Admitting: Family

## 2017-03-23 ENCOUNTER — Ambulatory Visit
Admission: RE | Admit: 2017-03-23 | Discharge: 2017-03-23 | Disposition: A | Discharge status: Home / Self Care | Payer: BLUE CROSS/BLUE SHIELD | Source: Ambulatory Visit | Attending: Orthopedic Surgery | Admitting: Orthopedic Surgery

## 2017-03-23 DIAGNOSIS — M25511 Pain in right shoulder: Principal | ICD-10-CM

## 2017-03-23 DIAGNOSIS — G8929 Other chronic pain: Secondary | ICD-10-CM

## 2017-03-23 MED ORDER — IOPAMIDOL (ISOVUE-M 200) INJECTION 41%
20.0000 mL | Freq: Once | INTRAMUSCULAR | Status: AC
  Administered 2017-03-23: 20 mL via INTRA_ARTICULAR

## 2017-03-29 DIAGNOSIS — M25522 Pain in left elbow: Secondary | ICD-10-CM | POA: Diagnosis not present

## 2017-03-29 DIAGNOSIS — M25512 Pain in left shoulder: Secondary | ICD-10-CM | POA: Diagnosis not present

## 2017-05-22 ENCOUNTER — Ambulatory Visit: Payer: BLUE CROSS/BLUE SHIELD | Admitting: Family

## 2017-05-22 ENCOUNTER — Other Ambulatory Visit (HOSPITAL_COMMUNITY): Payer: BLUE CROSS/BLUE SHIELD

## 2017-05-23 ENCOUNTER — Encounter: Payer: Self-pay | Admitting: *Deleted

## 2017-05-29 ENCOUNTER — Ambulatory Visit: Payer: BLUE CROSS/BLUE SHIELD | Admitting: General Surgery

## 2017-06-22 ENCOUNTER — Encounter: Payer: Self-pay | Admitting: *Deleted

## 2017-06-26 ENCOUNTER — Inpatient Hospital Stay (HOSPITAL_COMMUNITY): Admission: RE | Admit: 2017-06-26 | Payer: BLUE CROSS/BLUE SHIELD | Source: Ambulatory Visit

## 2017-06-26 ENCOUNTER — Ambulatory Visit: Payer: BLUE CROSS/BLUE SHIELD | Admitting: Family

## 2017-07-19 DIAGNOSIS — M25522 Pain in left elbow: Secondary | ICD-10-CM | POA: Diagnosis not present

## 2017-07-19 DIAGNOSIS — M25512 Pain in left shoulder: Secondary | ICD-10-CM | POA: Diagnosis not present

## 2017-07-19 DIAGNOSIS — M25511 Pain in right shoulder: Secondary | ICD-10-CM | POA: Diagnosis not present

## 2017-07-25 ENCOUNTER — Telehealth: Payer: Self-pay | Admitting: General Surgery

## 2017-07-25 NOTE — Telephone Encounter (Signed)
I HAVE CALLED PATIENT AND LEFT A MESSAGE FOR HIM TO CALL AND RESCHEDULE HIS APPOINTMENT FROM 05-2017 WITH DR Evette Cristal.

## 2017-07-27 ENCOUNTER — Ambulatory Visit: Payer: BLUE CROSS/BLUE SHIELD | Admitting: General Surgery

## 2017-07-27 ENCOUNTER — Encounter: Payer: Self-pay | Admitting: General Surgery

## 2017-07-27 VITALS — BP 132/74 | HR 66 | Resp 18 | Ht 66.0 in | Wt 205.0 lb

## 2017-07-27 DIAGNOSIS — K5732 Diverticulitis of large intestine without perforation or abscess without bleeding: Secondary | ICD-10-CM

## 2017-07-27 NOTE — Patient Instructions (Signed)
Patient to return as needed. The patient is aware to call back for any questions or concerns. 
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
## 2017-08-02 ENCOUNTER — Telehealth: Payer: Self-pay | Admitting: Internal Medicine

## 2017-08-02 MED ORDER — PREDNISONE 10 MG PO TABS: ORAL_TABLET | ORAL | 0 refills | Status: DC

## 2017-08-02 NOTE — Telephone Encounter (Signed)
Prednisone 10 mg take  4 each am x 2 days,   2 each am x 2 days,  1 each am x 2 days and stop   Lisinopril can also cause sensation of pnds so if not 100% better need to consider 6 week trial off preferable per PCP

## 2017-08-02 NOTE — Telephone Encounter (Signed)
Spoke with patient's wife. She is aware of MW's recs. Medication has been called into [facility] in [place]. Nothing else needed at time of call.

## 2017-08-02 NOTE — Telephone Encounter (Signed)
Spoke with EC Sybil. She stated that the patient has had a stuff nose and some post nasal drip that started yesterday afternoon. She stated that he usually gets these symptoms around this time of year. Denied any fevers or chest pain. Increased fatigue.   She wanted to know if we could call in a zpak before it gets worse.   Wishes to use [facility] in [place] on [address]   2nd call back number in case you can not reach her at the number above is [phone].   MW, please advise. Thanks!

## 2017-08-07 ENCOUNTER — Telehealth: Payer: Self-pay | Admitting: Internal Medicine

## 2017-08-07 MED ORDER — AZITHROMYCIN 250 MG PO TABS: 250.0000 mg | ORAL_TABLET | ORAL | 0 refills | Status: DC

## 2017-08-07 MED ORDER — HYDROCOD POLST-CPM POLST ER 10-8 MG/5ML PO SUER: 5.0000 mL | Freq: Two times a day (BID) | ORAL | 0 refills | Status: DC | PRN

## 2017-08-07 NOTE — Telephone Encounter (Signed)
Gillis Ends, patient's wife is calling back, asked to please CB on [phone], states we called different number when last called.

## 2017-08-07 NOTE — Telephone Encounter (Signed)
Spoke with patient Advised pt's wife that we called in Zpak 250mg  #6 take 2 today, take 1 daily until completed. Printed Rx for Tussionex ; take 28ml every 12 hours prn for cough. Advised pt's wife needing to pick up script today prior to office closing at noon today due to holiday.

## 2017-08-07 NOTE — Telephone Encounter (Signed)
Spoke with pt's wife who states that the prednisone he has been on is not helping and wanting to know if zpak as well as tussinex cough syrup can be prescribed for pt to see if that will help him.  Dr. Maple Hudson, please advise. Thanks!  Allergies  Allergen Reactions  . 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 Niacin And Related Other (See Comments)    unknown     Current Outpatient Medications:  .  aspirin EC 81 MG tablet, Take 81 mg by mouth daily., Disp: , Rfl:  .  baclofen (LIORESAL) 10 MG tablet, Take 1 tablet (10 mg total) by mouth 3 (three) times daily. As needed for muscle spasm, Disp: 50 tablet, Rfl: 0 .  diphenhydrAMINE (BENADRYL) 25 mg capsule, Take 1 capsule (25 mg total) by mouth every 8 (eight) hours as needed for itching., Disp: , Rfl:  .  EPIPEN 2-PAK 0.3 MG/0.3ML SOAJ injection, INJECT ONE SYRINGEFUL INTO THE MUSCLE ONCE AS NEEDED FOR ALLERGIC REACTION, Disp: 1 Device, Rfl: 6 .  EQ ALLERGY RELIEF 10 MG tablet, TAKE ONE TABLET BY MOUTH ONCE DAILY, Disp: 30 tablet, Rfl: 12 .  esomeprazole (NEXIUM) 40 MG capsule, Take 1 capsule (40 mg total) by mouth daily. (Patient taking differently: Take 40 mg by mouth at bedtime. ), Disp: 30 capsule, Rfl: 12 .  ezetimibe-simvastatin (VYTORIN) 10-10 MG per tablet, Take 1 tablet by mouth at bedtime. , Disp: , Rfl:  .  fluticasone (FLONASE) 50 MCG/ACT nasal spray, Place 2 sprays into both nostrils at bedtime. , Disp: , Rfl:  .  gabapentin (NEURONTIN) 100 MG capsule, Take 100 mg by mouth at bedtime., Disp: , Rfl:  .  ipratropium-albuterol (DUONEB) 0.5-2.5 (3) MG/3ML SOLN, Take 3 mLs by nebulization as needed., Disp: , Rfl:  .  lisinopril (PRINIVIL,ZESTRIL) 5 MG tablet, Take 5 mg by mouth at bedtime. , Disp: , Rfl: 0 .  magnesium oxide (MAG-OX) 400 MG tablet, Take 800 mg by mouth at bedtime. , Disp: , Rfl:  .   metFORMIN (GLUCOPHAGE) 500 MG tablet, Take 1 tablet by mouth 2 (two) times daily., Disp: , Rfl: 2 .  metoprolol succinate (TOPROL-XL) 25 MG 24 hr tablet, Take 25 mg by mouth at bedtime. , Disp: , Rfl:  .  predniSONE (DELTASONE) 10 MG tablet, Take 4 tabs for 2 days, then 3 tabs for 2 days, 2 tabs for 2 days, then 1 tab for 2 days, then stop., Disp: 20 tablet, Rfl: 0 .  sennosides-docusate sodium (SENOKOT-S) 8.6-50 MG tablet, Take 2 tablets by mouth daily., Disp: 30 tablet, Rfl: 1 .  Suvorexant (BELSOMRA) 20 MG TABS, Take 20 mg by mouth at bedtime., Disp: 30 tablet, Rfl: 5 .  traZODone (DESYREL) 100 MG tablet, TAKE ONE & ONE-HALF TABLETS BY MOUTH AT BEDTIME, Disp: 45 tablet, Rfl: 5 .  traZODone (DESYREL) 100 MG tablet, TAKE ONE & ONE-HALF TABLETS BY MOUTH AT BEDTIME, Disp: 45 tablet, Rfl: 11

## 2017-08-07 NOTE — Telephone Encounter (Signed)
Ok Zpak 250 mg, # 6, 2 today then one daily  Tussionex 140 ml,     5 ml every 12 hours if needed for cough

## 2017-08-07 NOTE — Telephone Encounter (Signed)
lmtcb x1 for pt's spouse.  

## 2017-08-07 NOTE — Telephone Encounter (Signed)
Pt wife calling back requesting Rx because he is sick. Prednisone is making pt sick and they would like something different called in. Pharm [facility] [address] [employer]. Cb is [phone]

## 2017-08-16 ENCOUNTER — Other Ambulatory Visit: Payer: Self-pay | Admitting: Orthopedic Surgery

## 2017-08-16 DIAGNOSIS — M25511 Pain in right shoulder: Secondary | ICD-10-CM

## 2017-08-16 DIAGNOSIS — M25512 Pain in left shoulder: Secondary | ICD-10-CM

## 2017-08-21 ENCOUNTER — Ambulatory Visit (HOSPITAL_COMMUNITY)
Admission: RE | Admit: 2017-08-21 | Discharge: 2017-08-21 | Disposition: A | Discharge status: Home / Self Care | Payer: BLUE CROSS/BLUE SHIELD | Source: Ambulatory Visit | Attending: Surgery | Admitting: Surgery

## 2017-08-21 ENCOUNTER — Encounter: Payer: Self-pay | Admitting: Family

## 2017-08-21 ENCOUNTER — Ambulatory Visit (INDEPENDENT_AMBULATORY_CARE_PROVIDER_SITE_OTHER): Payer: BLUE CROSS/BLUE SHIELD | Admitting: Family

## 2017-08-21 VITALS — BP 126/86 | HR 90 | Temp 97.8°F | Resp 18 | Ht 66.0 in | Wt 203.0 lb

## 2017-08-21 DIAGNOSIS — Z95828 Presence of other vascular implants and grafts: Secondary | ICD-10-CM | POA: Diagnosis not present

## 2017-08-21 DIAGNOSIS — Z48812 Encounter for surgical aftercare following surgery on the circulatory system: Secondary | ICD-10-CM | POA: Insufficient documentation

## 2017-08-21 DIAGNOSIS — I70798 Other atherosclerosis of other type of bypass graft(s) of the extremities, other extremity: Secondary | ICD-10-CM | POA: Diagnosis not present

## 2017-08-21 DIAGNOSIS — S45102D Unspecified injury of brachial artery, left side, subsequent encounter: Secondary | ICD-10-CM | POA: Diagnosis not present

## 2017-08-21 DIAGNOSIS — Z951 Presence of aortocoronary bypass graft: Secondary | ICD-10-CM | POA: Insufficient documentation

## 2017-08-21 DIAGNOSIS — X58XXXA Exposure to other specified factors, initial encounter: Secondary | ICD-10-CM | POA: Insufficient documentation

## 2017-08-21 DIAGNOSIS — S45102A Unspecified injury of brachial artery, left side, initial encounter: Secondary | ICD-10-CM
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
## 2017-09-04 ENCOUNTER — Ambulatory Visit
Admission: RE | Admit: 2017-09-04 | Discharge: 2017-09-04 | Disposition: A | Discharge status: Home / Self Care | Payer: BLUE CROSS/BLUE SHIELD | Source: Ambulatory Visit | Attending: Orthopedic Surgery | Admitting: Orthopedic Surgery

## 2017-09-04 DIAGNOSIS — M25512 Pain in left shoulder: Secondary | ICD-10-CM | POA: Diagnosis not present

## 2017-09-04 DIAGNOSIS — M25511 Pain in right shoulder: Secondary | ICD-10-CM

## 2017-09-04 MED ORDER — METHYLPREDNISOLONE ACETATE 40 MG/ML INJ SUSP (RADIOLOG
60.0000 mg | Freq: Once | INTRAMUSCULAR | Status: AC
  Administered 2017-09-04: 60 mg via INTRA_ARTICULAR

## 2017-09-04 MED ORDER — IOPAMIDOL (ISOVUE-M 200) INJECTION 41%
20.0000 mL | Freq: Once | INTRAMUSCULAR | Status: AC
  Administered 2017-09-04: 20 mL via INTRA_ARTICULAR

## 2017-09-06 DIAGNOSIS — M25511 Pain in right shoulder: Secondary | ICD-10-CM | POA: Diagnosis not present

## 2017-09-06 DIAGNOSIS — M25512 Pain in left shoulder: Secondary | ICD-10-CM | POA: Diagnosis not present

## 2017-09-11 DIAGNOSIS — E7849 Other hyperlipidemia: Secondary | ICD-10-CM | POA: Diagnosis not present

## 2017-09-11 DIAGNOSIS — I2510 Atherosclerotic heart disease of native coronary artery without angina pectoris: Secondary | ICD-10-CM | POA: Diagnosis not present

## 2017-09-12 DIAGNOSIS — M5412 Radiculopathy, cervical region: Secondary | ICD-10-CM | POA: Diagnosis not present

## 2017-09-13 ENCOUNTER — Other Ambulatory Visit: Payer: Self-pay | Admitting: Orthopedic Surgery

## 2017-09-13 DIAGNOSIS — M5412 Radiculopathy, cervical region: Secondary | ICD-10-CM

## 2017-09-17 ENCOUNTER — Ambulatory Visit
Admission: RE | Admit: 2017-09-17 | Discharge: 2017-09-17 | Disposition: A | Discharge status: Home / Self Care | Payer: BLUE CROSS/BLUE SHIELD | Source: Ambulatory Visit | Attending: Orthopedic Surgery | Admitting: Orthopedic Surgery

## 2017-09-17 DIAGNOSIS — M5412 Radiculopathy, cervical region: Secondary | ICD-10-CM

## 2017-09-17 DIAGNOSIS — M50123 Cervical disc disorder at C6-C7 level with radiculopathy: Secondary | ICD-10-CM | POA: Diagnosis not present

## 2017-09-25 DIAGNOSIS — M25511 Pain in right shoulder: Secondary | ICD-10-CM | POA: Diagnosis not present

## 2017-09-29 DIAGNOSIS — M67912 Unspecified disorder of synovium and tendon, left shoulder: Secondary | ICD-10-CM | POA: Diagnosis not present

## 2017-10-03 DIAGNOSIS — M5416 Radiculopathy, lumbar region: Secondary | ICD-10-CM | POA: Diagnosis not present

## 2017-10-03 DIAGNOSIS — M546 Pain in thoracic spine: Secondary | ICD-10-CM | POA: Diagnosis not present

## 2017-10-03 DIAGNOSIS — M542 Cervicalgia: Secondary | ICD-10-CM | POA: Diagnosis not present

## 2017-10-03 DIAGNOSIS — M47812 Spondylosis without myelopathy or radiculopathy, cervical region: Secondary | ICD-10-CM | POA: Diagnosis not present

## 2017-10-03 DIAGNOSIS — M5030 Other cervical disc degeneration, unspecified cervical region: Secondary | ICD-10-CM | POA: Diagnosis not present

## 2017-10-03 DIAGNOSIS — Z981 Arthrodesis status: Secondary | ICD-10-CM | POA: Diagnosis not present

## 2017-10-19 ENCOUNTER — Encounter: Payer: Self-pay | Admitting: Internal Medicine

## 2017-10-19 ENCOUNTER — Ambulatory Visit (INDEPENDENT_AMBULATORY_CARE_PROVIDER_SITE_OTHER): Payer: BLUE CROSS/BLUE SHIELD | Admitting: Internal Medicine

## 2017-10-19 VITALS — BP 128/80 | HR 73 | Temp 98.2°F | Wt 209.0 lb

## 2017-10-19 DIAGNOSIS — M1990 Unspecified osteoarthritis, unspecified site: Secondary | ICD-10-CM

## 2017-10-19 DIAGNOSIS — J302 Other seasonal allergic rhinitis: Secondary | ICD-10-CM

## 2017-10-19 DIAGNOSIS — K219 Gastro-esophageal reflux disease without esophagitis: Secondary | ICD-10-CM | POA: Diagnosis not present

## 2017-10-19 DIAGNOSIS — E7800 Pure hypercholesterolemia, unspecified: Secondary | ICD-10-CM

## 2017-10-19 DIAGNOSIS — J3089 Other allergic rhinitis: Secondary | ICD-10-CM

## 2017-10-19 DIAGNOSIS — M62838 Other muscle spasm: Secondary | ICD-10-CM

## 2017-10-19 DIAGNOSIS — I10 Essential (primary) hypertension: Secondary | ICD-10-CM

## 2017-10-19 DIAGNOSIS — F5101 Primary insomnia: Secondary | ICD-10-CM | POA: Diagnosis not present

## 2017-10-19 DIAGNOSIS — Z23 Encounter for immunization: Secondary | ICD-10-CM

## 2017-10-19 DIAGNOSIS — E119 Type 2 diabetes mellitus without complications: Secondary | ICD-10-CM

## 2017-10-19 DIAGNOSIS — I2510 Atherosclerotic heart disease of native coronary artery without angina pectoris: Secondary | ICD-10-CM | POA: Diagnosis not present

## 2017-10-19 LAB — CBC
HCT: 41 % (ref 39.0–52.0)
Hemoglobin: 14.4 g/dL (ref 13.0–17.0)
MCHC: 35.2 g/dL (ref 30.0–36.0)
MCV: 86 fl (ref 78.0–100.0)
Platelets: 205 10*3/uL (ref 150.0–400.0)
RBC: 4.76 Mil/uL (ref 4.22–5.81)
RDW: 14.3 % (ref 11.5–15.5)
WBC: 6.3 10*3/uL (ref 4.0–10.5)

## 2017-10-19 LAB — COMPREHENSIVE METABOLIC PANEL
ALT: 29 U/L (ref 0–53)
AST: 26 U/L (ref 0–37)
Albumin: 4.1 g/dL (ref 3.5–5.2)
Alkaline Phosphatase: 62 U/L (ref 39–117)
BUN: 15 mg/dL (ref 6–23)
CO2: 29 mEq/L (ref 19–32)
Calcium: 9.3 mg/dL (ref 8.4–10.5)
Chloride: 104 mEq/L (ref 96–112)
Creatinine, Ser: 0.98 mg/dL (ref 0.40–1.50)
GFR: 82.14 mL/min (ref >60.00)
Glucose, Bld: 125 mg/dL — ABNORMAL HIGH (ref 70–99)
Potassium: 4.4 mEq/L (ref 3.5–5.1)
Sodium: 137 mEq/L (ref 135–145)
Total Bilirubin: 0.6 mg/dL (ref 0.2–1.2)
Total Protein: 6.7 g/dL (ref 6.0–8.3)

## 2017-10-19 LAB — LIPID PANEL
Cholesterol: 171 mg/dL (ref 0–200)
HDL: 49.6 mg/dL (ref >39.00)
Total CHOL/HDL Ratio: 3
Triglycerides: 440 mg/dL — ABNORMAL HIGH (ref 0.0–149.0)

## 2017-10-19 LAB — TSH: TSH: 1.3 u[IU]/mL (ref 0.35–4.50)

## 2017-10-19 LAB — LDL CHOLESTEROL, DIRECT: Direct LDL: 86 mg/dL

## 2017-10-19 LAB — HEMOGLOBIN A1C: Hgb A1c MFr Bld: 7.7 % — ABNORMAL HIGH (ref 4.6–6.5)

## 2017-10-19 MED ORDER — METFORMIN HCL 500 MG PO TABS: 500.0000 mg | ORAL_TABLET | Freq: Two times a day (BID) | ORAL | 2 refills | Status: DC

## 2017-10-19 MED ORDER — BACLOFEN 10 MG PO TABS: 5.0000 mg | ORAL_TABLET | Freq: Three times a day (TID) | ORAL | 0 refills | Status: DC

## 2017-10-19 NOTE — Progress Notes (Signed)
HPI  Pt presents to the clinic today to establish care and for management of the conditions listed below. He is transferring care from [facility].  Seasonal Allergies: Worse in the spring. Controlled on Claritin and Nasonex daily.  Arthritis: Mainly in his shoulders and hips. He does not take anything for arthritis.  Asthma/COPD: He no longer smokes. He uses Albuterol inhaler 2 x day. He follows with Dr. Maple Hudson  HLD with CAD: s/p CABG. He denies chest pain. He is not sure when the last time his cholesterol was checked. He is taking Vytorin as prescribed. He follows with Dr. Arlyn Leak  DM 2: he reports he has not had his A1C checked in 1 year. His sugars range 160-240. He is taking Metformin, Lisinopril and Gabapentin as prescribed. He does not check his feet regualry.  GERD: Triggered by spicy, greasy foods, eating late a night. He takes Nexium daily.  HTN: His BP today is 128/80. He is taking Metoprolol as prescribed.  Insomnia: He has trouble falling asleep. He takes Trazadone nightly.  Muscles Spasms: Mainly in his legs. He takes Gabepentin and Baclofen with some relief. He reports he gets the most relief with Magnesium supplement.  Flu: 06/2017 Tetanus: 07/2015 Pneuomvax: 05/2010 Shingrix: never PSA Screening: unsure Colon Screening: 11/2013 Vision Screening: as needed Dentist: as needed  Past Medical History:  Diagnosis Date  . Allergic rhinitis   . Arthritis   . Arthritis of left acromioclavicular joint 07/25/2014  . Asthma   . Chronic bronchitis (HCC)   . Chronic kidney disease    H/O STONES  . Colon polyp   . Coronary artery disease   . Cramps, muscle, general   . Diabetes mellitus without complication (HCC)   . Diverticula of colon   . GERD (gastroesophageal reflux disease)   . Hx of heart bypass surgery 2005  . Hypercholesteremia   . Hypertension   . Impingement syndrome of left shoulder 07/25/2014  . Laceration of brachial artery 03/11/2016   left arm  .  Leg cramps   . Lumbar disc disease   . Myocardial infarction (HCC)   . Partial nontraumatic rupture of right rotator cuff 10/20/2016    Current Outpatient Medications  Medication Sig Dispense Refill  . aspirin EC 81 MG tablet Take 81 mg by mouth daily.    . diphenhydrAMINE (BENADRYL) 25 mg capsule Take 1 capsule (25 mg total) by mouth every 8 (eight) hours as needed for itching.    Marland Kitchen EPIPEN 2-PAK 0.3 MG/0.3ML SOAJ injection INJECT ONE SYRINGEFUL INTO THE MUSCLE ONCE AS NEEDED FOR ALLERGIC REACTION 1 Device 6  . ezetimibe-simvastatin (VYTORIN) 10-10 MG per tablet Take 1 tablet by mouth at bedtime.     . fluticasone (FLONASE) 50 MCG/ACT nasal spray Place 2 sprays into both nostrils at bedtime.     . gabapentin (NEURONTIN) 300 MG capsule Take 900 mg by mouth at bedtime.   1  . ipratropium-albuterol (DUONEB) 0.5-2.5 (3) MG/3ML SOLN Take 3 mLs by nebulization as needed.    Marland Kitchen lisinopril (PRINIVIL,ZESTRIL) 5 MG tablet Take 5 mg by mouth at bedtime.   0  . magnesium oxide (MAG-OX) 400 MG tablet Take 800 mg by mouth at bedtime.     . metFORMIN (GLUCOPHAGE) 500 MG tablet Take 1 tablet by mouth 2 (two) times daily.  2  . metoprolol succinate (TOPROL-XL) 25 MG 24 hr tablet Take 25 mg by mouth at bedtime.     . sennosides-docusate sodium (SENOKOT-S) 8.6-50 MG tablet Take 2 tablets  by mouth daily. 30 tablet 1  . traZODone (DESYREL) 100 MG tablet TAKE ONE & ONE-HALF TABLETS BY MOUTH AT BEDTIME 45 tablet 11   No current facility-administered medications for this visit.     Allergies  Allergen Reactions  . 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 Niacin And Related Other (See Comments)    unknown    Family History  Problem Relation Age of Onset  . Asthma Father   . Emphysema Father   . Stroke Mother     Social History   Socioeconomic History  . Marital  status: Married    Spouse name: Not on file  . Number of children: Not on file  . Years of education: Not on file  . Highest education level: Not on file  Social Needs  . Financial resource strain: Not on file  . Food insecurity - worry: Not on file  . Food insecurity - inability: Not on file  . Transportation needs - medical: Not on file  . Transportation needs - non-medical: Not on file  Occupational History  . Not on file  Tobacco Use  . Smoking status: Former Smoker    Packs/day: 0.00    Years: 10.00    Pack years: 0.00    Types: Cigars    Last attempt to quit: 08/09/2003    Years since quitting: 14.2  . Smokeless tobacco: Former Neurosurgeon  . Tobacco comment: 16/per day  Substance and Sexual Activity  . Alcohol use: No    Alcohol/week: 0.0 oz    Frequency: Never  . Drug use: No  . Sexual activity: Yes  Other Topics Concern  . Not on file  Social History Narrative  . Not on file    ROS:  Constitutional: Denies fever, malaise, fatigue, headache or abrupt weight changes.  HEENT: Denies eye pain, eye redness, ear pain, ringing in the ears, wax buildup, runny nose, nasal congestion, bloody nose, or sore throat. Respiratory: Denies difficulty breathing, shortness of breath, cough or sputum production.   Cardiovascular: Denies chest pain, chest tightness, palpitations or swelling in the hands or feet.  Gastrointestinal: Pt reports intermittent reflux. Denies abdominal pain, bloating, constipation, diarrhea or blood in the stool.  GU: Denies frequency, urgency, pain with urination, blood in urine, odor or discharge. Musculoskeletal: Pt reports muscle spasms in legs. Denies decrease in range of motion, difficulty with gait, muscle pain or joint pain and swelling.  Skin: Denies redness, rashes, lesions or ulcercations.  Neurological: Pt reports insomnia. Denies dizziness, difficulty with memory, difficulty with speech or problems with balance and coordination.  Psych: Denies anxiety,  depression, SI/HI.  No other specific complaints in a complete review of systems (except as listed in HPI above).  PE:  BP 128/80   Pulse 73   Temp 98.2 F (36.8 C) (Oral)   Wt 209 lb (94.8 kg)   SpO2 94%   BMI 33.73 kg/m   Wt Readings from Last 3 Encounters:  10/19/17 209 lb (94.8 kg)  08/21/17 203 lb (92.1 kg)  07/27/17 205 lb (93 kg)    General: Appears his stated age, obese in NAD. Skin: No ulcerations noted. Cardiovascular: Normal rate and rhythm. S1,S2 noted.  No murmur, rubs or gallops noted.  Pulmonary/Chest: Normal effort and positive vesicular breath sounds. No respiratory distress. No wheezes, rales or ronchi noted.  Abdomen: Soft and nontender. Normal bowel sounds. Musculoskeletal: Gait slow and steady. Neurological: Alert and oriented.  Psychiatric: Mood and affect normal. Behavior is normal. Judgment and thought content normal.     BMET    Component Value Date/Time   NA 139 10/20/2016 0646   NA 140 02/19/2013 1510   K 4.3 10/20/2016 0646   K 4.0 02/19/2013 1510   CL 105 10/20/2016 0646   CL 106 02/19/2013 1510   CO2 24 05/21/2016 0425   CO2 28 02/19/2013 1510   GLUCOSE 139 (H) 10/20/2016 0646   GLUCOSE 79 02/19/2013 1510   BUN 22 (H) 10/20/2016 0646   BUN 16 02/19/2013 1510   CREATININE 0.90 10/20/2016 0646   CREATININE 0.90 02/19/2013 1510   CALCIUM 8.2 (L) 05/21/2016 0425   CALCIUM 8.9 02/19/2013 1510   GFRNONAA >60 05/24/2016 0516   GFRNONAA >60 02/19/2013 1510   GFRAA >60 05/24/2016 0516   GFRAA >60 02/19/2013 1510    Lipid Panel     Component Value Date/Time   CHOL 146 12/17/2013 1253   TRIG 294 (H) 12/17/2013 1253   HDL 48 12/17/2013 1253   CHOLHDL 3.0 12/17/2013 1253   VLDL 59 (H) 12/17/2013 1253   LDLCALC 39 12/17/2013 1253    CBC    Component Value Date/Time   WBC 11.1 (H) 05/22/2016 0952   RBC 4.54 05/22/2016 0952   HGB 15.3 10/20/2016 0646   HGB 14.0 02/19/2013 1510   HCT 45.0 10/20/2016 0646   HCT 42.1 02/19/2013  1510   PLT 177 05/22/2016 0952   PLT 219 02/19/2013 1510   MCV 83.9 05/22/2016 0952   MCV 85 02/19/2013 1510   MCH 29.5 05/22/2016 0952   MCHC 35.2 05/22/2016 0952   RDW 14.1 05/22/2016 0952   RDW 13.3 02/19/2013 1510   LYMPHSABS 2.1 05/18/2016 1108   LYMPHSABS 2.4 02/19/2013 1510   MONOABS 0.5 05/18/2016 1108   MONOABS 0.5 02/19/2013 1510   EOSABS 0.3 05/18/2016 1108   EOSABS 0.4 02/19/2013 1510   BASOSABS 0.1 05/18/2016 1108   BASOSABS 0.1 02/19/2013 1510    Hgb A1C Lab Results  Component Value Date   HGBA1C 6.0 (H) 03/10/2016     Assessment and Plan:

## 2017-10-24 ENCOUNTER — Other Ambulatory Visit: Payer: Self-pay | Admitting: Internal Medicine

## 2017-10-24 MED ORDER — METFORMIN HCL 1000 MG PO TABS: 1000.0000 mg | ORAL_TABLET | Freq: Two times a day (BID) | ORAL | 3 refills | Status: DC

## 2017-10-25 ENCOUNTER — Encounter: Payer: Self-pay | Admitting: Internal Medicine

## 2017-10-25 DIAGNOSIS — E119 Type 2 diabetes mellitus without complications: Secondary | ICD-10-CM | POA: Insufficient documentation

## 2017-10-25 DIAGNOSIS — M62838 Other muscle spasm: Secondary | ICD-10-CM | POA: Insufficient documentation

## 2017-10-25 NOTE — Assessment & Plan Note (Signed)
Controlled on Trazadone Will monitor 

## 2017-10-25 NOTE — Assessment & Plan Note (Signed)
Continue Neurontin, Baclofen and Mag Ox Will monitor

## 2017-10-25 NOTE — Assessment & Plan Note (Signed)
CMET and Lipid profile today Encouraged him to consume a low fat diet Continue Vytorin for now

## 2017-10-25 NOTE — Assessment & Plan Note (Signed)
Continue Claritin and Nasonex

## 2017-10-25 NOTE — Assessment & Plan Note (Signed)
A1C and microalbumin today Encouraged him to consume a low carb, low fat diet and exercise for weight loss Continue Metformin, Lisinopril and Gabapentin Foot exam today Encouraged yearly eye exam Flu shot UTD Pneumovax today

## 2017-10-25 NOTE — Assessment & Plan Note (Signed)
Controlled on Metoprolol °Reinforced DASH diet and exercise for weight loss °

## 2017-10-25 NOTE — Assessment & Plan Note (Signed)
Encouraged exercise for weight loss to help take pressure off the joints Advised him to try Tylenol Arthritis prn

## 2017-10-25 NOTE — Assessment & Plan Note (Signed)
No angina CMET and Lipid profiled today.  Continue Vytroin He will continue to follow with cardiology

## 2017-10-25 NOTE — Patient Instructions (Signed)
Fat and Cholesterol Restricted Diet Getting too much fat and cholesterol in your diet [place] cause health problems. Following this diet helps keep your fat and cholesterol at normal levels. This can keep you from getting sick. What types of fat should I choose?  Choose monosaturated and polyunsaturated fats. These are found in foods such as olive oil, canola oil, flaxseeds, walnuts, almonds, and seeds.  Eat more omega-3 fats. Good choices include salmon, mackerel, sardines, tuna, flaxseed oil, and ground flaxseeds.  Limit saturated fats. These are in animal products such as meats, butter, and cream. They can also be in plant products such as palm oil, palm kernel oil, and coconut oil.  Avoid foods with partially hydrogenated oils in them. These contain trans fats. Examples of foods that have trans fats are stick margarine, some tub margarines, cookies, crackers, and other baked goods. What general guidelines do I need to follow?  Check food labels. Look for the words "trans fat" and "saturated fat."  When preparing a meal: ? Fill half of your plate with vegetables and green salads. ? Fill one fourth of your plate with whole grains. Look for the word "whole" as the first word in the ingredient list. ? Fill one fourth of your plate with lean protein foods.  Eat more foods that have fiber, like apples, carrots, beans, peas, and barley.  Eat more home-cooked foods. Eat less at restaurants and buffets.  Limit or avoid alcohol.  Limit foods high in starch and sugar.  Limit fried foods.  Cook foods without frying them. Baking, boiling, grilling, and broiling are all great options.  Lose weight if you are overweight. Losing even a small amount of weight can help your overall health. It can also help prevent diseases such as diabetes and heart disease. What foods can I eat? Grains Whole grains, such as whole wheat or whole grain breads, crackers, cereals, and pasta. Unsweetened oatmeal,  bulgur, barley, quinoa, or brown rice. Corn or whole wheat flour tortillas. Vegetables Fresh or frozen vegetables (raw, steamed, roasted, or grilled). Green salads. Fruits All fresh, canned (in natural juice), or frozen fruits. Meat and Other Protein Products Ground beef (85% or leaner), grass-fed beef, or beef trimmed of fat. Skinless chicken or turkey. Ground chicken or turkey. Pork trimmed of fat. All fish and seafood. Eggs. Dried beans, peas, or lentils. Unsalted nuts or seeds. Unsalted canned or dry beans. Dairy Low-fat dairy products, such as skim or 1% milk, 2% or reduced-fat cheeses, low-fat ricotta or cottage cheese, or plain low-fat yogurt. Fats and Oils Tub margarines without trans fats. Light or reduced-fat mayonnaise and salad dressings. Avocado. Olive, canola, sesame, or safflower oils. Natural peanut or almond butter (choose ones without added sugar and oil). The items listed above Kaspar not be a complete list of recommended foods or beverages. Contact your dietitian for more options. What foods are not recommended? Grains White bread. White pasta. White rice. Cornbread. Bagels, pastries, and croissants. Crackers that contain trans fat. Vegetables White potatoes. Corn. Creamed or fried vegetables. Vegetables in a cheese sauce. Fruits Dried fruits. Canned fruit in light or heavy syrup. Fruit juice. Meat and Other Protein Products Fatty cuts of meat. Ribs, chicken wings, bacon, sausage, bologna, salami, chitterlings, fatback, hot dogs, bratwurst, and packaged luncheon meats. Liver and organ meats. Dairy Whole or 2% milk, cream, half-and-half, and cream cheese. Whole milk cheeses. Whole-fat or sweetened yogurt. Full-fat cheeses. Nondairy creamers and whipped toppings. Processed cheese, cheese spreads, or cheese curds. Sweets and Desserts Corn   syrup, sugars, honey, and molasses. Candy. Jam and jelly. Syrup. Sweetened cereals. Cookies, pies, cakes, donuts, muffins, and ice  cream. Fats and Oils Butter, stick margarine, lard, shortening, ghee, or bacon fat. Coconut, palm kernel, or palm oils. Beverages Alcohol. Sweetened drinks (such as sodas, lemonade, and fruit drinks or punches). The items listed above Butch not be a complete list of foods and beverages to avoid. Contact your dietitian for more information. This information is not intended to replace advice given to you by your health care provider. Make sure you discuss any questions you have with your health care provider. Document Released: 01/24/2012 Document Revised: 03/31/2016 Document Reviewed: 10/24/2013 Elsevier Interactive Patient Education  2018 Elsevier Inc.  

## 2017-10-25 NOTE — Assessment & Plan Note (Signed)
Discussed how weight loss could help improve reflux Continue Nexium for now Avoid triggers CBC and CMET today

## 2017-11-03 ENCOUNTER — Encounter: Payer: Self-pay | Admitting: Internal Medicine

## 2017-11-03 ENCOUNTER — Other Ambulatory Visit: Payer: Self-pay | Admitting: Internal Medicine

## 2017-11-03 ENCOUNTER — Ambulatory Visit (INDEPENDENT_AMBULATORY_CARE_PROVIDER_SITE_OTHER): Payer: BLUE CROSS/BLUE SHIELD | Admitting: Internal Medicine

## 2017-11-03 VITALS — BP 122/78 | HR 81 | Temp 98.1°F | Wt 206.0 lb

## 2017-11-03 DIAGNOSIS — R42 Dizziness and giddiness: Secondary | ICD-10-CM | POA: Diagnosis not present

## 2017-11-03 DIAGNOSIS — E782 Mixed hyperlipidemia: Secondary | ICD-10-CM

## 2017-11-03 DIAGNOSIS — E1141 Type 2 diabetes mellitus with diabetic mononeuropathy: Secondary | ICD-10-CM | POA: Diagnosis not present

## 2017-11-03 DIAGNOSIS — H538 Other visual disturbances: Secondary | ICD-10-CM

## 2017-11-03 DIAGNOSIS — G4452 New daily persistent headache (NDPH): Secondary | ICD-10-CM

## 2017-11-03 LAB — MICROALBUMIN / CREATININE URINE RATIO
Creatinine,U: 161.8 mg/dL
Microalb Creat Ratio: 0.4 mg/g (ref 0.0–30.0)
Microalb, Ur: <0.7 mg/dL (ref 0.0–1.9)

## 2017-11-03 MED ORDER — GABAPENTIN 300 MG PO CAPS: 900.0000 mg | ORAL_CAPSULE | Freq: Every day | ORAL | 0 refills | Status: DC

## 2017-11-03 MED ORDER — MECLIZINE HCL 25 MG PO TABS: 25.0000 mg | ORAL_TABLET | Freq: Three times a day (TID) | ORAL | 0 refills | Status: DC | PRN

## 2017-11-03 MED ORDER — PREDNISONE 10 MG PO TABS: ORAL_TABLET | ORAL | 0 refills | Status: DC

## 2017-11-03 NOTE — Patient Instructions (Signed)
Benign Positional Vertigo Vertigo is the feeling that you or your surroundings are moving when they are not. Benign positional vertigo is the most common form of vertigo. The cause of this condition is not serious (is benign). This condition is triggered by certain movements and positions (is positional). This condition can be dangerous if it occurs while you are doing something that could endanger you or others, such as driving. What are the causes? In many cases, the cause of this condition is not known. It Curtiss be caused by a disturbance in an area of the inner ear that helps your brain to sense movement and balance. This disturbance can be caused by a viral infection (labyrinthitis), head injury, or repetitive motion. What increases the risk? This condition is more likely to develop in:  Women.  People who are 50 years of age or older.  What are the signs or symptoms? Symptoms of this condition usually happen when you move your head or your eyes in different directions. Symptoms Scroggs start suddenly, and they usually last for less than a minute. Symptoms Daffron include:  Loss of balance and falling.  Feeling like you are spinning or moving.  Feeling like your surroundings are spinning or moving.  Nausea and vomiting.  Blurred vision.  Dizziness.  Involuntary eye movement (nystagmus).  Symptoms can be mild and cause only slight annoyance, or they can be severe and interfere with daily life. Episodes of benign positional vertigo Hughett return (recur) over time, and they Gonet be triggered by certain movements. Symptoms Gilvin improve over time. How is this diagnosed? This condition is usually diagnosed by medical history and a physical exam of the head, neck, and ears. You Vera be referred to a health care provider who specializes in ear, nose, and throat (ENT) problems (otolaryngologist) or a provider who specializes in disorders of the nervous system (neurologist). You Matthews have additional testing,  including:  MRI.  A CT scan.  Eye movement tests. Your health care provider Ivens ask you to change positions quickly while he or she watches you for symptoms of benign positional vertigo, such as nystagmus. Eye movement Olivos be tested with an electronystagmogram (ENG), caloric stimulation, the Dix-Hallpike test, or the roll test.  An electroencephalogram (EEG). This records electrical activity in your brain.  Hearing tests.  How is this treated? Usually, your health care provider will treat this by moving your head in specific positions to adjust your inner ear back to normal. Surgery Hommes be needed in severe cases, but this is rare. In some cases, benign positional vertigo Regas resolve on its own in 2-4 weeks. Follow these instructions at home: Safety  Move slowly.Avoid sudden body or head movements.  Avoid driving.  Avoid operating heavy machinery.  Avoid doing any tasks that would be dangerous to you or others if a vertigo episode would occur.  If you have trouble walking or keeping your balance, try using a cane for stability. If you feel dizzy or unstable, sit down right away.  Return to your normal activities as told by your health care provider. Ask your health care provider what activities are safe for you. General instructions  Take over-the-counter and prescription medicines only as told by your health care provider.  Avoid certain positions or movements as told by your health care provider.  Drink enough fluid to keep your urine clear or pale yellow.  Keep all follow-up visits as told by your health care provider. This is important. Contact a health care   provider if:  You have a fever.  Your condition gets worse or you develop new symptoms.  Your family or friends notice any behavioral changes.  Your nausea or vomiting gets worse.  You have numbness or a "pins and needles" sensation. Get help right away if:  You have difficulty speaking or moving.  You are  always dizzy.  You faint.  You develop severe headaches.  You have weakness in your legs or arms.  You have changes in your hearing or vision.  You develop a stiff neck.  You develop sensitivity to light. This information is not intended to replace advice given to you by your health care provider. Make sure you discuss any questions you have with your health care provider. Document Released: 05/02/2006 Document Revised: 12/31/2015 Document Reviewed: 11/17/2014 Elsevier Interactive Patient Education  2018 Elsevier Inc.  

## 2017-11-03 NOTE — Telephone Encounter (Signed)
Last OV: 11/03/17 PCP: Sampson Si Pharmacy: [facility] [address], [place] - [address] [phone] (Phone) [phone] (Fax)    Last filled by historic provider

## 2017-11-03 NOTE — Progress Notes (Signed)
Subjective:    Patient ID: [name], male    DOB: [birth year], 62 y.o.   MRN: [id number]  HPI  Pt presents to the clinic today with c/o dizziness. He reports this started 4-5 months ago. He describes the dizziness as a sensation that the room is spinning. The dizziness is worse with position changes and head movement. He reports associated intermittent headaches as well as blurred vision. He describes the headache as aching, pressure in the forehead. He denies sensitivity to light or sound. He denies runny nose, nasal congestion, ear pain or sore throat. He denies chest pain or shortness of breath. He has not had an eye exam over the last year. He has tried Meclizine without any relief. He has not tried anything additional OTC. He does not drink a lot of water, mostly soda.  He would like a refill of Gabapentin today.  His wife also wants to know why he is on Lisinopril. She reports he does not have high blood pressure. She reports his previous PCP started him on it along with Metformin when he was diagnosed with diabetes. She ran out of the medication and would like to know if he really needs it.  His wife also reports that he is still taking the Vytorin. His triglycerides are 440. He does see cardiology.  Review of Systems      Past Medical History:  Diagnosis Date  . Allergic rhinitis   . Arthritis   . Arthritis of left acromioclavicular joint 07/25/2014  . Asthma   . Chronic bronchitis (HCC)   . Chronic kidney disease    H/O STONES  . Colon polyp   . Coronary artery disease   . Cramps, muscle, general   . Diabetes mellitus without complication (HCC)   . Diverticula of colon   . GERD (gastroesophageal reflux disease)   . Hx of heart bypass surgery 2005  . Hypercholesteremia   . Hypertension   . Impingement syndrome of left shoulder 07/25/2014  . Laceration of brachial artery 03/11/2016   left arm  . Leg cramps   . Lumbar disc disease   . Myocardial infarction (HCC)     . Partial nontraumatic rupture of right rotator cuff 10/20/2016    Current Outpatient Medications  Medication Sig Dispense Refill  . aspirin EC 81 MG tablet Take 81 mg by mouth daily.    . baclofen (LIORESAL) 10 MG tablet Take 0.5 tablets (5 mg total) by mouth 3 (three) times daily. 30 each 0  . diphenhydrAMINE (BENADRYL) 25 mg capsule Take 1 capsule (25 mg total) by mouth every 8 (eight) hours as needed for itching.    Marland Kitchen EPIPEN 2-PAK 0.3 MG/0.3ML SOAJ injection INJECT ONE SYRINGEFUL INTO THE MUSCLE ONCE AS NEEDED FOR ALLERGIC REACTION 1 Device 6  . ezetimibe-simvastatin (VYTORIN) 10-10 MG per tablet Take 1 tablet by mouth at bedtime.     . fluticasone (FLONASE) 50 MCG/ACT nasal spray Place 2 sprays into both nostrils at bedtime.     . gabapentin (NEURONTIN) 300 MG capsule Take 900 mg by mouth at bedtime.   1  . ipratropium-albuterol (DUONEB) 0.5-2.5 (3) MG/3ML SOLN Take 3 mLs by nebulization as needed.    Marland Kitchen lisinopril (PRINIVIL,ZESTRIL) 5 MG tablet Take 5 mg by mouth at bedtime.   0  . magnesium oxide (MAG-OX) 400 MG tablet Take 800 mg by mouth at bedtime.     . metFORMIN (GLUCOPHAGE) 1000 MG tablet Take 1 tablet (1,000 mg total) by mouth 2 (  two) times daily with a meal. 180 tablet 3  . metoprolol succinate (TOPROL-XL) 25 MG 24 hr tablet Take 25 mg by mouth at bedtime.     . sennosides-docusate sodium (SENOKOT-S) 8.6-50 MG tablet Take 2 tablets by mouth daily. 30 tablet 1  . traZODone (DESYREL) 100 MG tablet TAKE ONE & ONE-HALF TABLETS BY MOUTH AT BEDTIME 45 tablet 11   No current facility-administered medications for this visit.     Allergies  Allergen Reactions  . 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 Niacin And Related Other (See Comments)    unknown    Family History  Problem Relation Age of Onset  . Asthma Father   . Emphysema Father   .  Stroke Mother     Social History   Socioeconomic History  . Marital status: Married    Spouse name: Not on file  . Number of children: Not on file  . Years of education: Not on file  . Highest education level: Not on file  Occupational History  . Not on file  Social Needs  . Financial resource strain: Not on file  . Food insecurity:    Worry: Not on file    Inability: Not on file  . Transportation needs:    Medical: Not on file    Non-medical: Not on file  Tobacco Use  . Smoking status: Former Smoker    Packs/day: 0.00    Years: 10.00    Pack years: 0.00    Types: Cigars    Last attempt to quit: 08/09/2003    Years since quitting: 14.2  . Smokeless tobacco: Former Neurosurgeon  . Tobacco comment: 16/per day  Substance and Sexual Activity  . Alcohol use: No    Alcohol/week: 0.0 oz    Frequency: Never  . Drug use: No  . Sexual activity: Yes  Lifestyle  . Physical activity:    Days per week: Not on file    Minutes per session: Not on file  . Stress: Not on file  Relationships  . Social connections:    Talks on phone: Not on file    Gets together: Not on file    Attends religious service: Not on file    Active member of club or organization: Not on file    Attends meetings of clubs or organizations: Not on file    Relationship status: Not on file  . Intimate partner violence:    Fear of current or ex partner: Not on file    Emotionally abused: Not on file    Physically abused: Not on file    Forced sexual activity: Not on file  Other Topics Concern  . Not on file  Social History Narrative  . Not on file     Constitutional: Pt reports headache. Denies fever, malaise, fatigue, or abrupt weight changes.  HEENT: Denies eye pain, eye redness, ear pain, ringing in the ears, wax buildup, runny nose, nasal congestion, bloody nose, or sore throat. Respiratory: Denies difficulty breathing, shortness of breath, cough or sputum production.   Cardiovascular: Denies chest pain,  chest tightness, palpitations or swelling in the hands or feet.  Neurological: Pt reports dizziness. Denies difficulty with memory, difficulty with speech or problems with balance and coordination.    No other specific complaints in a complete review of systems (except as  listed in HPI above).  Objective:   Physical Exam  BP 122/78   Pulse 81   Temp 98.1 F (36.7 C) (Oral)   Wt 206 lb (93.4 kg)   SpO2 97%   BMI 33.25 kg/m  Wt Readings from Last 3 Encounters:  11/03/17 206 lb (93.4 kg)  10/19/17 209 lb (94.8 kg)  08/21/17 203 lb (92.1 kg)    General: Appears older than his stated age, obese in NAD. HEENT:  Eyes: PERRLA and EOMs intact, no nystagmus noted; Ears: Tm's gray and intact, normal light reflex;  Cardiovascular: Normal rate and rhythm. S1,S2 noted.  No murmur, rubs or gallops noted. No carotid bruits noted. Pulmonary/Chest: Normal effort and positive vesicular breath sounds. No respiratory distress. No wheezes, rales or ronchi noted.  Neurological: Alert and oriented. Coordination normal but slower than average.    BMET    Component Value Date/Time   NA 137 10/19/2017 1532   NA 140 02/19/2013 1510   K 4.4 10/19/2017 1532   K 4.0 02/19/2013 1510   CL 104 10/19/2017 1532   CL 106 02/19/2013 1510   CO2 29 10/19/2017 1532   CO2 28 02/19/2013 1510   GLUCOSE 125 (H) 10/19/2017 1532   GLUCOSE 79 02/19/2013 1510   BUN 15 10/19/2017 1532   BUN 16 02/19/2013 1510   CREATININE 0.98 10/19/2017 1532   CREATININE 0.90 02/19/2013 1510   CALCIUM 9.3 10/19/2017 1532   CALCIUM 8.9 02/19/2013 1510   GFRNONAA >60 05/24/2016 0516   GFRNONAA >60 02/19/2013 1510   GFRAA >60 05/24/2016 0516   GFRAA >60 02/19/2013 1510    Lipid Panel     Component Value Date/Time   CHOL 171 10/19/2017 1532   TRIG 440.0 (H) 10/19/2017 1532   HDL 49.60 10/19/2017 1532   CHOLHDL 3 10/19/2017 1532   VLDL 59 (H) 12/17/2013 1253   LDLCALC 39 12/17/2013 1253    CBC    Component Value  Date/Time   WBC 6.3 10/19/2017 1532   RBC 4.76 10/19/2017 1532   HGB 14.4 10/19/2017 1532   HGB 14.0 02/19/2013 1510   HCT 41.0 10/19/2017 1532   HCT 42.1 02/19/2013 1510   PLT 205.0 10/19/2017 1532   PLT 219 02/19/2013 1510   MCV 86.0 10/19/2017 1532   MCV 85 02/19/2013 1510   MCH 29.5 05/22/2016 0952   MCHC 35.2 10/19/2017 1532   RDW 14.3 10/19/2017 1532   RDW 13.3 02/19/2013 1510   LYMPHSABS 2.1 05/18/2016 1108   LYMPHSABS 2.4 02/19/2013 1510   MONOABS 0.5 05/18/2016 1108   MONOABS 0.5 02/19/2013 1510   EOSABS 0.3 05/18/2016 1108   EOSABS 0.4 02/19/2013 1510   BASOSABS 0.1 05/18/2016 1108   BASOSABS 0.1 02/19/2013 1510    Hgb A1C Lab Results  Component Value Date   HGBA1C 7.7 (H) 10/19/2017            Assessment & Plan:   Dizziness, Headache, Blurred Vision:  Orthostatics negative ? BPPV Encouraged to increase his water intake eRx for Meclizine 25 mg TID prn per wife request  eRx for Pred Taper x 6 days (monitor sugars while on Prednisone) He declines referral for vestibular rehab Recent labs reviewed Advised him to try the Epley Maneuver at home Advised him to make an appt for an eye exam  DM 2 with Neuropathy:  Urine microalbumin today to see if patient truly needs Lisinopril Does not need for HTN and no known CHF Advised him to make an appt for an  eye exam  HLD, Mixed:  Advised him to discuss with cardiology about high triglycerides Encouraged him to consume a low fat diet  Return precautions discussed Nicki Reaper, NP

## 2017-11-03 NOTE — Telephone Encounter (Signed)
VO from [place] to refill... Rx sent through e-scribe

## 2017-11-03 NOTE — Telephone Encounter (Signed)
Copied from CRM [id number]. Topic: Quick Communication - Rx Refill/Question >> Nov 03, 2017  3:00 PM Stovall, Shana A wrote: Medication: gabapentin (NEURONTIN) 300 MG capsule [[id number]]  Has the patient contacted their pharmacy? No  (Agent: If no, request that the patient contact the pharmacy for the refill.) Preferred Pharmacy (with phone number or street name): [facility] [address]- meant to tell provider in appt today and forgot to mention this one Agent: Please be advised that RX refills [place] take up to 3 business days. We ask that you follow-up with your pharmacy.

## 2017-11-08 NOTE — Addendum Note (Signed)
Addended by: Roena Malady on: 11/08/2017 12:41 PM   Modules accepted: Orders

## 2018-02-15 ENCOUNTER — Ambulatory Visit: Payer: BLUE CROSS/BLUE SHIELD | Admitting: Internal Medicine

## 2018-02-15 ENCOUNTER — Encounter: Payer: Self-pay | Admitting: Internal Medicine

## 2018-02-15 DIAGNOSIS — Z91038 Other insect allergy status: Secondary | ICD-10-CM | POA: Diagnosis not present

## 2018-02-15 DIAGNOSIS — F5101 Primary insomnia: Secondary | ICD-10-CM | POA: Diagnosis not present

## 2018-02-15 MED ORDER — AZITHROMYCIN 250 MG PO TABS: ORAL_TABLET | ORAL | 1 refills | Status: DC

## 2018-02-15 MED ORDER — FLUTICASONE PROPIONATE 50 MCG/ACT NA SUSP: 2.0000 | Freq: Every day | NASAL | 12 refills | Status: DC

## 2018-02-15 MED ORDER — TRAZODONE HCL 100 MG PO TABS: ORAL_TABLET | ORAL | 11 refills | Status: DC

## 2018-02-15 MED ORDER — EPIPEN 2-PAK 0.3 MG/0.3ML IJ SOAJ: INTRAMUSCULAR | 6 refills | Status: DC

## 2018-02-15 MED ORDER — LORATADINE 10 MG PO TABS: 10.0000 mg | ORAL_TABLET | Freq: Every day | ORAL | 12 refills | Status: DC

## 2018-02-15 MED ORDER — ALBUTEROL SULFATE HFA 108 (90 BASE) MCG/ACT IN AERS: INHALATION_SPRAY | RESPIRATORY_TRACT | 12 refills | Status: DC

## 2018-02-15 MED ORDER — IPRATROPIUM-ALBUTEROL 0.5-2.5 (3) MG/3ML IN SOLN: 3.0000 mL | RESPIRATORY_TRACT | 12 refills | Status: DC | PRN

## 2018-02-15 NOTE — Patient Instructions (Signed)
Refill scripts sent. Trazodone script can be called in.   Please call if we can help

## 2018-02-15 NOTE — Progress Notes (Signed)
HPI male former smoker followed for asthma/bronchitis, allergic rhinitis, insomnia, complicated by CAD/CABG/ ACDF, GERD Office Spirometry 07/07/2015-mild restriction consistent with his abdominal obesity. FVC 3.28/74%, FEV1 2.71/78%, FEV1/FVC 0.83 ----------------------------------------------------------------------------------------------  02/15/17- 62 year old male former smoker followed for asthma/bronchitis, allergic rhinitis, complicated by CAD/CABG/ ACDF, GERD FOLLOWS FOR: Pt has SOB and wheezing when out in heat. Otherwise doing well. Wife and patient described no new problems. He doesn't breathe as comfortably in the hot weather. Insomnia has been more of her problem. Chronic trazodone no longer holds him at night. He often wakes around 1 or 2 AM and stays of the rest of  the night watching TV. He admits he sleeps some during the daytime and we discussed sleep habits.  02/15/2018-62 year old male former smoker followed for asthma/bronchitis, allergic rhinitis, complicated by CAD/CABG/ ACDF, GERD  Asthma: Pt has not had good time with breathing during the heat-otherwise doing well. Will need Rx for Proair, Epipen, Nexium, Flonase, Duoneb, Loratadine, and Trazadone.  Trazodone 100 mg is working well for insomnia.  Wife watches over him. Breathing has been okay-bothered by the heat and tends to stay indoors now.  Dry cough, occasional wheeze.  Uses nebulizer 2 or 3 times a week.  No increasing cough, no sputum, denies chest pain, palpitation, adenopathy or ankle edema.  Keeps a bee sting available for EpiPen but has not been stung in a very long time.  Had Pneumovax-[facility] last [facility] at [facility].  ROS-HPI      + = positive Constitutional:   No-   weight loss, night sweats, fevers, chills, fatigue, lassitude. HEENT:   No-  headaches, difficulty swallowing, tooth/dental problems,  No-sore throat,       No-sneezing, itching, ear ache, nasal congestion, no-post nasal drip,  CV:  No-  Anginal  chest  pain, orthopnea, PND, swelling in lower extremities, anasarca, dizziness, palpitations Resp: +   shortness of breath with exertion or at rest.              No- productive cough, +-non-productive cough,  No- coughing up of blood.              No-   change in color of mucus.  No- wheezing.   Skin: No-   rash or lesions. GI:  No-heartburn, indigestion, abdominal pain, nausea, vomiting,  GU:  MS: +  joint pain or swelling.  Neuro-     nothing unusual Psych:  No- change in mood or affect. No depression or anxiety.  No memory loss.  OBJ General- Alert, Oriented, Affect-appropriate, Distress- none acute, laconic, + obese Skin- rash-none, lesions- none, excoriation- none Lymphadenopathy- none Head- atraumatic            Eyes- Gross vision intact, PERRLA, conjunctivae clear secretions            Ears- Hearing, canals-normal            Nose-  , no-Septal dev, polyps, erosion, perforation             Throat- Mallampati II-III , mucosa clear , drainage- none, tonsils- atrophic, + edentulous Neck- flexible , trachea midline, no stridor , thyroid nl, carotid no bruit Chest - symmetrical excursion , unlabored           Heart/CV- RRR , no murmur , no gallop  , no rub, nl s1 s2                           - JVD- none ,  edema- none, stasis changes- none, varices- none           Lung- clear,  dullness-none, rub- none, cough-none, wheeze-none           Chest wall- + sternotomy scar Abd-  Br/ Gen/ Rectal- Not done, not indicated Extrem- cyanosis- none, clubbing, none, atrophy- none, strength- nl Neuro- grossly intact to observation

## 2018-03-26 DIAGNOSIS — Z91038 Other insect allergy status: Secondary | ICD-10-CM | POA: Insufficient documentation

## 2018-03-26 NOTE — Assessment & Plan Note (Signed)
Keeps an EpiPen, not used

## 2018-03-26 NOTE — Assessment & Plan Note (Signed)
Trazodone has provided stable, comfortable help with chronic nonspecific insomnia.  Wife keeps track of his sleep habits.  Has to avoid excessive napping.  Little snoring.

## 2018-03-27 ENCOUNTER — Other Ambulatory Visit: Payer: Self-pay | Admitting: Internal Medicine

## 2018-03-27 ENCOUNTER — Telehealth: Payer: Self-pay | Admitting: Internal Medicine

## 2018-03-27 MED ORDER — TRAZODONE HCL 100 MG PO TABS: ORAL_TABLET | ORAL | 5 refills | Status: DC

## 2018-03-27 NOTE — Telephone Encounter (Signed)
Spoke with pt's wife, Gillis Ends. Pt is needing a refill on Trazodone. Rx has been sent in. Nothing further was needed.

## 2018-05-30 ENCOUNTER — Telehealth: Payer: Self-pay | Admitting: Internal Medicine

## 2018-05-30 NOTE — Telephone Encounter (Signed)
Called patient unable to reach left message to give us a call back.

## 2018-05-31 MED ORDER — HYDROCOD POLST-CPM POLST ER 10-8 MG/5ML PO SUER: 5.0000 mL | Freq: Two times a day (BID) | ORAL | 0 refills | Status: DC | PRN

## 2018-05-31 NOTE — Telephone Encounter (Signed)
Ok to refill tussionex 

## 2018-05-31 NOTE — Telephone Encounter (Signed)
Patient wife returned call.  Advised patient prescription is ready for pick up.  No call back is necessary.

## 2018-05-31 NOTE — Telephone Encounter (Signed)
Spoke with pt's wife, Gillis Ends. Pt is needing a refill on Tussionex. This was last filled by CY on 08/07/17.  CY - please advise on refill. Thanks.

## 2018-05-31 NOTE — Telephone Encounter (Signed)
Rx has been printed and placed on CY's cart to be signed. Will call pt once rx has been signed.

## 2018-05-31 NOTE — Telephone Encounter (Signed)
Noted.  Will close encounter.  

## 2018-05-31 NOTE — Telephone Encounter (Signed)
LMTCB for pt. Signed rx has been placed up front for pick up.

## 2018-07-23 ENCOUNTER — Telehealth: Payer: Self-pay

## 2018-07-23 MED ORDER — EZETIMIBE-SIMVASTATIN 10-40 MG PO TABS: 1.0000 | ORAL_TABLET | Freq: Every day | ORAL | 2 refills | Status: DC

## 2018-07-23 NOTE — Telephone Encounter (Signed)
Rx for Vytorin 10-40mg  one tablet daily sent to [facility] as requested #30 with 2 refills.  Patient due to follow up in Feb 2020.    Patient will be scheduled for follow up.

## 2018-08-06 ENCOUNTER — Other Ambulatory Visit: Payer: Self-pay | Admitting: Cardiology

## 2018-08-06 NOTE — Telephone Encounter (Signed)
° °  1. Which medications need to be refilled? (please list name of each medication and dose if known) Nitroglycerin 0.4mg  sublingual tablets PRN  2. Which pharmacy/location (including street and city if local pharmacy) is medication to be sent to?[facility] [address], [place]  3. Do they need a 30 day or 90 day supply? 30

## 2018-08-07 MED ORDER — NITROGLYCERIN 0.4 MG SL SUBL: 0.4000 mg | SUBLINGUAL_TABLET | SUBLINGUAL | 0 refills | Status: DC | PRN

## 2018-08-07 NOTE — Telephone Encounter (Signed)
Has been given to Dr. Krasowski for him to review  

## 2018-08-07 NOTE — Telephone Encounter (Signed)
Nitroglycerin 0.4 mg sl as needed for chest pain refilled per Dr. Bing Matter.

## 2018-08-21 ENCOUNTER — Telehealth: Payer: Self-pay | Admitting: Internal Medicine

## 2018-08-21 MED ORDER — HYDROCOD POLST-CPM POLST ER 10-8 MG/5ML PO SUER: 5.0000 mL | Freq: Two times a day (BID) | ORAL | 0 refills | Status: DC | PRN

## 2018-08-21 MED ORDER — AZITHROMYCIN 250 MG PO TABS: ORAL_TABLET | ORAL | 0 refills | Status: DC

## 2018-08-21 NOTE — Telephone Encounter (Signed)
Called and spoke with patients wife, she is aware and verbalized understanding. Nothing further needed.  

## 2018-08-21 NOTE — Telephone Encounter (Signed)
Scripts for Zpak and Tussionex e-sent to [facility]

## 2018-08-21 NOTE — Telephone Encounter (Addendum)
Spoke wiith wife in the lobby. She is requesting a ZPAK and Tussionex for his symptoms. She states he starts to get chest congestion and cough with mucus when he is about to get sick and wants to prevent it from getting worse. His symptoms started yesterday and his wife and son just got over the flu (it wasn't tested just diagnosed by symptoms). He does have a sore throat but denies fever, chills, and body aches. They have not tried anything for his symptoms. CY please advise.  Current Outpatient Medications on File Prior to Visit  Medication Sig Dispense Refill  . albuterol (PROAIR HFA) 108 (90 Base) MCG/ACT inhaler Inhale 2 puffs every 6 hours if needed for breathing 1 Inhaler 12  . aspirin EC 81 MG tablet Take 81 mg by mouth daily.    Marland Kitchen azithromycin (ZITHROMAX) 250 MG tablet 2 today then one daily 6 tablet 1  . baclofen (LIORESAL) 10 MG tablet Take 0.5 tablets (5 mg total) by mouth 3 (three) times daily. 30 each 0  . chlorpheniramine-HYDROcodone (TUSSIONEX PENNKINETIC ER) 10-8 MG/5ML SUER Take 5 mLs by mouth every 12 (twelve) hours as needed for cough. 140 mL 0  . diphenhydrAMINE (BENADRYL) 25 mg capsule Take 1 capsule (25 mg total) by mouth every 8 (eight) hours as needed for itching.    Marland Kitchen EPIPEN 2-PAK 0.3 MG/0.3ML SOAJ injection INJECT ONE SYRINGEFUL INTO THE MUSCLE ONCE AS NEEDED FOR ALLERGIC REACTION 1 Device 6  . esomeprazole (NEXIUM) 40 MG capsule   7  . ezetimibe-simvastatin (VYTORIN) 10-40 MG tablet Take 1 tablet by mouth daily. 30 tablet 2  . fluticasone (FLONASE) 50 MCG/ACT nasal spray Place 2 sprays into both nostrils at bedtime. 16 g 12  . gabapentin (NEURONTIN) 300 MG capsule Take 3 capsules (900 mg total) by mouth at bedtime. 270 capsule 0  . ipratropium-albuterol (DUONEB) 0.5-2.5 (3) MG/3ML SOLN Take 3 mLs by nebulization as needed. 360 mL 12  . loratadine (CLARITIN) 10 MG tablet Take 1 tablet (10 mg total) by mouth daily. 30 tablet 12  . magnesium oxide (MAG-OX) 400 MG tablet Take  800 mg by mouth at bedtime.     . meclizine (ANTIVERT) 25 MG tablet Take 1 tablet (25 mg total) by mouth 3 (three) times daily as needed for dizziness. 30 tablet 0  . metFORMIN (GLUCOPHAGE) 1000 MG tablet Take 1 tablet (1,000 mg total) by mouth 2 (two) times daily with a meal. 180 tablet 3  . metoprolol succinate (TOPROL-XL) 25 MG 24 hr tablet Take 25 mg by mouth at bedtime.     . nitroGLYCERIN (NITROSTAT) 0.4 MG SL tablet Place 1 tablet (0.4 mg total) under the tongue every 5 (five) minutes as needed for chest pain. 25 tablet 0  . sennosides-docusate sodium (SENOKOT-S) 8.6-50 MG tablet Take 2 tablets by mouth daily. 30 tablet 1  . traZODone (DESYREL) 100 MG tablet TAKE 1 & 1/2 (ONE & ONE-HALF) TABLETS BY MOUTH AT BEDTIME 45 tablet 5  . traZODone (DESYREL) 100 MG tablet TAKE ONE & ONE-HALF TABLETS BY MOUTH AT BEDTIME 45 tablet 5   No current facility-administered medications on file prior to visit.    Allergies  Allergen Reactions  . 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 Niacin And Related Other (See Comments)    unknown

## 2018-08-21 NOTE — Telephone Encounter (Signed)
Pt's wife would like CY to send Rx's to [facility] and [facility] in the morning. I have attempted several times to get in touch with [facility] [address] pharmacy but continue to be hung up on. Will call back to cancel Rx's in morning and have CY send to correct pharmacy.

## 2018-08-22 ENCOUNTER — Other Ambulatory Visit: Payer: Self-pay | Admitting: Internal Medicine

## 2018-08-22 MED ORDER — AZITHROMYCIN 250 MG PO TABS: ORAL_TABLET | ORAL | 0 refills | Status: DC

## 2018-08-22 MED ORDER — HYDROCOD POLST-CPM POLST ER 10-8 MG/5ML PO SUER: 5.0000 mL | Freq: Two times a day (BID) | ORAL | 0 refills | Status: DC | PRN

## 2018-08-22 NOTE — Telephone Encounter (Signed)
Ok sorry- ok to redirect scripts.  [name], we need to train triage to always verify which pharmacy patients want to use currently. Ai just used what popped up for him.

## 2018-08-22 NOTE — Telephone Encounter (Signed)
Pt's wife is aware that we have sent Rx's to the correct pharmacy and cancelled the orders at [facility]. Nothing more needed.

## 2018-09-03 ENCOUNTER — Telehealth: Payer: Self-pay | Admitting: Cardiology

## 2018-09-03 NOTE — Telephone Encounter (Signed)
Per Dr. Tomie China this medication cannot be refilled because it isn't a cardiac medication. Informed his wife they could set to primary care or gastroenterologist. She verbally understands

## 2018-09-03 NOTE — Telephone Encounter (Signed)
° ° °  1. Which medications need to be refilled? (please list name of each medication and dose if known) Esomeprazole DR 40mg  capsule  2. Which pharmacy/location (including street and city if local pharmacy) is medication to be sent to?[id number]  3. Do they need a 30 day or 90 day supply? 90

## 2018-09-03 NOTE — Telephone Encounter (Signed)
Has been given to Dr. Revankar for him to review  

## 2018-09-04 ENCOUNTER — Other Ambulatory Visit: Payer: Self-pay | Admitting: Internal Medicine

## 2018-09-04 MED ORDER — ESOMEPRAZOLE MAGNESIUM 40 MG PO CPDR
40.0000 mg | DELAYED_RELEASE_CAPSULE | Freq: Every day | ORAL | 1 refills | Status: DC
Start: 2018-09-04 — End: 2018-10-08

## 2018-09-04 NOTE — Telephone Encounter (Signed)
Pt's wife called office stating the pt's cardiologist was prescribing esomeprazole but he has retired. The pt's new cardiologist would not prescribe the medication and they were told to call his PCP. She wants to know if Rene Kocher will prescribe the medication.

## 2018-09-05 NOTE — Telephone Encounter (Signed)
Rx sent through e-scribe  

## 2018-09-11 ENCOUNTER — Other Ambulatory Visit: Payer: Self-pay | Admitting: *Deleted

## 2018-09-11 MED ORDER — GABAPENTIN 300 MG PO CAPS
900.0000 mg | ORAL_CAPSULE | Freq: Every day | ORAL | 0 refills | Status: DC
Start: 2018-09-11 — End: 2018-12-17

## 2018-09-11 NOTE — Telephone Encounter (Signed)
Last Rx 10/2017. Wife states medication was being prescribed by alternate provider but he is no longer in practice. pls advise

## 2018-10-08 ENCOUNTER — Other Ambulatory Visit: Payer: Self-pay | Admitting: Internal Medicine

## 2018-10-08 MED ORDER — EZETIMIBE 10 MG PO TABS
10.0000 mg | ORAL_TABLET | Freq: Every day | ORAL | 0 refills | Status: DC
Start: 2018-10-08 — End: 2019-01-04

## 2018-10-08 MED ORDER — ESOMEPRAZOLE MAGNESIUM 40 MG PO CPDR: 40.0000 mg | DELAYED_RELEASE_CAPSULE | Freq: Every day | ORAL | 0 refills | Status: DC

## 2018-10-08 MED ORDER — SIMVASTATIN 40 MG PO TABS: 40.0000 mg | ORAL_TABLET | Freq: Every day | ORAL | 0 refills | Status: DC

## 2018-12-13 ENCOUNTER — Other Ambulatory Visit: Payer: Self-pay

## 2018-12-13 MED ORDER — METOPROLOL SUCCINATE ER 25 MG PO TB24: 25.0000 mg | ORAL_TABLET | Freq: Every day | ORAL | 0 refills | Status: DC

## 2018-12-13 NOTE — Telephone Encounter (Signed)
appt made

## 2018-12-13 NOTE — Telephone Encounter (Signed)
Set up virtual visit to follow up chronic conditions.

## 2018-12-13 NOTE — Telephone Encounter (Signed)
Pt's wife (DPR signed) said that Dr Donnie Aho is not working now due to health issues and pt has not contacted cardiology office for refill of metoprolol succinate 25 mg 24 hr tab taking one daily. Pt request refill med for 90 days to [facility] [address]. Pt last had visit 11/03/17.Please advise. No future appt scheduled.Marland Kitchen

## 2018-12-17 ENCOUNTER — Ambulatory Visit (INDEPENDENT_AMBULATORY_CARE_PROVIDER_SITE_OTHER): Payer: BLUE CROSS/BLUE SHIELD | Admitting: Internal Medicine

## 2018-12-17 DIAGNOSIS — M1990 Unspecified osteoarthritis, unspecified site: Secondary | ICD-10-CM

## 2018-12-17 DIAGNOSIS — I2510 Atherosclerotic heart disease of native coronary artery without angina pectoris: Secondary | ICD-10-CM

## 2018-12-17 DIAGNOSIS — K219 Gastro-esophageal reflux disease without esophagitis: Secondary | ICD-10-CM

## 2018-12-17 DIAGNOSIS — F5101 Primary insomnia: Secondary | ICD-10-CM

## 2018-12-17 DIAGNOSIS — E7800 Pure hypercholesterolemia, unspecified: Secondary | ICD-10-CM | POA: Diagnosis not present

## 2018-12-17 DIAGNOSIS — I10 Essential (primary) hypertension: Secondary | ICD-10-CM

## 2018-12-17 DIAGNOSIS — E119 Type 2 diabetes mellitus without complications: Secondary | ICD-10-CM

## 2018-12-17 MED ORDER — GABAPENTIN 300 MG PO CAPS: 900.0000 mg | ORAL_CAPSULE | Freq: Every day | ORAL | 0 refills | Status: DC

## 2018-12-17 MED ORDER — DICLOFENAC SODIUM 75 MG PO TBEC: 75.0000 mg | DELAYED_RELEASE_TABLET | Freq: Two times a day (BID) | ORAL | 2 refills | Status: DC

## 2018-12-17 NOTE — Progress Notes (Signed)
Virtual Visit via Video Note  I connected with [name] on 12/17/18 at 11:15 AM EDT by a video enabled telemedicine application and verified that I am speaking with the correct person using two identifiers.  Location: Patient: Home Provider: Office   I discussed the limitations of evaluation and management by telemedicine and the availability of in person appointments. The patient expressed understanding and agreed to proceed.  History of Present Illness:  Pt due for follow up of chronic conditions.   Arthritis: Generalized. He reports he hurts all the time. He does not take anything for arthritis OTC. He is not physically active.   HLD with CAD s/p MI: His last LDL was 86, triglycerides 473, 10/2017. He is taking Simvastatin and Zetia as prescribed. He is also taking Metoprolol and ASA. He denies myalgias. He has been trying to consume a low fat diet.  GERD: He denies breakthrough on Esomeprazole. There is no upper GI on file.  DM 2: His last A1C was 7.7%, 10/2017. He does not monitor his sugars at home. He is taking Metformin as prescribed. He has been taking Gabapentin at night for neuropathic pain. He does not routinely check his feet. His last eye exam was more than 2 years ago. Flu: 05/2018. Pneumvoax 02/2018.   HTN: He does not monitor his BP at home. He is taking Metoprolol as prescribed. ECG from 10/2016 reviewed.  Insomnia: He takes Trazadone as needed with good relief. There is no sleep study on file.  Past Medical History:  Diagnosis Date  . Allergic rhinitis   . Arthritis   . Arthritis of left acromioclavicular joint 07/25/2014  . Asthma   . Chronic bronchitis (HCC)   . Chronic kidney disease    H/O STONES  . Colon polyp   . Coronary artery disease   . Cramps, muscle, general   . Diabetes mellitus without complication (HCC)   . Diverticula of colon   . GERD (gastroesophageal reflux disease)   . Hx of heart bypass surgery 2005  . Hypercholesteremia   .  Hypertension   . Impingement syndrome of left shoulder 07/25/2014  . Laceration of brachial artery 03/11/2016   left arm  . Leg cramps   . Lumbar disc disease   . Myocardial infarction (HCC)   . Partial nontraumatic rupture of right rotator cuff 10/20/2016    Current Outpatient Medications  Medication Sig Dispense Refill  . albuterol (PROAIR HFA) 108 (90 Base) MCG/ACT inhaler Inhale 2 puffs every 6 hours if needed for breathing 1 Inhaler 12  . aspirin EC 81 MG tablet Take 81 mg by mouth daily.    Marland Kitchen azithromycin (ZITHROMAX) 250 MG tablet 2 today thn one daily 6 tablet 0  . baclofen (LIORESAL) 10 MG tablet Take 0.5 tablets (5 mg total) by mouth 3 (three) times daily. 30 each 0  . chlorpheniramine-HYDROcodone (TUSSIONEX PENNKINETIC ER) 10-8 MG/5ML SUER Take 5 mLs by mouth every 12 (twelve) hours as needed for cough. 140 mL 0  . diphenhydrAMINE (BENADRYL) 25 mg capsule Take 1 capsule (25 mg total) by mouth every 8 (eight) hours as needed for itching.    Marland Kitchen EPIPEN 2-PAK 0.3 MG/0.3ML SOAJ injection INJECT ONE SYRINGEFUL INTO THE MUSCLE ONCE AS NEEDED FOR ALLERGIC REACTION 1 Device 6  . esomeprazole (NEXIUM) 40 MG capsule Take 1 capsule (40 mg total) by mouth daily at 12 noon. MUST SCHEDULE ANNUAL PHYSICAL 90 capsule 0  . ezetimibe (ZETIA) 10 MG tablet Take 1 tablet (10 mg total)  by mouth daily. 90 tablet 0  . ezetimibe-simvastatin (VYTORIN) 10-40 MG tablet Take 1 tablet by mouth daily. 30 tablet 2  . fluticasone (FLONASE) 50 MCG/ACT nasal spray Place 2 sprays into both nostrils at bedtime. 16 g 12  . gabapentin (NEURONTIN) 300 MG capsule Take 3 capsules (900 mg total) by mouth at bedtime. 270 capsule 0  . ipratropium-albuterol (DUONEB) 0.5-2.5 (3) MG/3ML SOLN Take 3 mLs by nebulization as needed. 360 mL 12  . loratadine (CLARITIN) 10 MG tablet Take 1 tablet (10 mg total) by mouth daily. 30 tablet 12  . magnesium oxide (MAG-OX) 400 MG tablet Take 800 mg by mouth at bedtime.     . meclizine  (ANTIVERT) 25 MG tablet Take 1 tablet (25 mg total) by mouth 3 (three) times daily as needed for dizziness. 30 tablet 0  . metFORMIN (GLUCOPHAGE) 1000 MG tablet Take 1 tablet (1,000 mg total) by mouth 2 (two) times daily with a meal. 180 tablet 3  . metoprolol succinate (TOPROL-XL) 25 MG 24 hr tablet Take 1 tablet (25 mg total) by mouth at bedtime. 90 tablet 0  . nitroGLYCERIN (NITROSTAT) 0.4 MG SL tablet Place 1 tablet (0.4 mg total) under the tongue every 5 (five) minutes as needed for chest pain. 25 tablet 0  . sennosides-docusate sodium (SENOKOT-S) 8.6-50 MG tablet Take 2 tablets by mouth daily. 30 tablet 1  . simvastatin (ZOCOR) 40 MG tablet Take 1 tablet (40 mg total) by mouth at bedtime. 90 tablet 0  . traZODone (DESYREL) 100 MG tablet TAKE 1 & 1/2 (ONE & ONE-HALF) TABLETS BY MOUTH AT BEDTIME 45 tablet 5  . traZODone (DESYREL) 100 MG tablet TAKE ONE & ONE-HALF TABLETS BY MOUTH AT BEDTIME 45 tablet 5   No current facility-administered medications for this visit.     Allergies  Allergen Reactions  . 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 Niacin And Related Other (See Comments)    unknown    Family History  Problem Relation Age of Onset  . Asthma Father   . Emphysema Father   . Stroke Mother     Social History   Socioeconomic History  . Marital status: Married    Spouse name: Not on file  . Number of children: Not on file  . Years of education: Not on file  . Highest education level: Not on file  Occupational History  . Not on file  Social Needs  . Financial resource strain: Not on file  . Food insecurity:    Worry: Not on file    Inability: Not on file  . Transportation needs:    Medical: Not on file    Non-medical: Not on file  Tobacco Use  . Smoking status: Former Smoker    Packs/day: 0.00    Years: 10.00    Pack years: 0.00     Types: Cigars    Last attempt to quit: 08/09/2003    Years since quitting: 15.3  . Smokeless tobacco: Former Neurosurgeon  . Tobacco comment: 16/per day  Substance and Sexual Activity  . Alcohol use: No    Alcohol/week: 0.0 standard drinks    Frequency: Never  . Drug use: No  . Sexual activity: Yes  Lifestyle  . Physical activity:    Days per week: Not on file    Minutes per session:  Not on file  . Stress: Not on file  Relationships  . Social connections:    Talks on phone: Not on file    Gets together: Not on file    Attends religious service: Not on file    Active member of club or organization: Not on file    Attends meetings of clubs or organizations: Not on file    Relationship status: Not on file  . Intimate partner violence:    Fear of current or ex partner: Not on file    Emotionally abused: Not on file    Physically abused: Not on file    Forced sexual activity: Not on file  Other Topics Concern  . Not on file  Social History Narrative  . Not on file     Constitutional: Pt reports intermittent headaches. Denies fever, malaise, fatigue, or abrupt weight changes.  HEENT: Denies eye pain, eye redness, ear pain, ringing in the ears, wax buildup, runny nose, nasal congestion, bloody nose, or sore throat. Respiratory: Denies difficulty breathing, shortness of breath, cough or sputum production.   Cardiovascular: Denies chest pain, chest tightness, palpitations or swelling in the hands or feet.  Gastrointestinal: Denies abdominal pain, bloating, constipation, diarrhea or blood in the stool.  GU: Denies urgency, frequency, pain with urination, burning sensation, blood in urine, odor or discharge. Musculoskeletal: Pt reports joint pain. Denies decrease in range of motion, difficulty with gait, muscle pain or joint swelling.  Skin: Denies redness, rashes, lesions or ulcercations.  Neurological: Pt reports burning sensation in feet. Denies dizziness, difficulty with memory,  difficulty with speech or problems with balance and coordination.  Psych: Denies anxiety, depression, SI/HI.  No other specific complaints in a complete review of systems (except as listed in HPI above).   Wt Readings from Last 3 Encounters:  02/15/18 198 lb 12.8 oz (90.2 kg)  11/03/17 206 lb (93.4 kg)  10/19/17 209 lb (94.8 kg)    General: Appears his stated age, obese in NAD. Skin: Warm, dry and intact. No obvious lower extremity ulcerations noted. HEENT: Head: normal shape and size; Eyes: sclera white,EOMs intact;  Pulmonary/Chest: Normal effort. No respiratory distress.  Neurological: Alert and oriented.  Psychiatric: Mood and affect normal. Behavior is normal. Judgment and thought content normal.     BMET    Component Value Date/Time   NA 137 10/19/2017 1532   NA 140 02/19/2013 1510   K 4.4 10/19/2017 1532   K 4.0 02/19/2013 1510   CL 104 10/19/2017 1532   CL 106 02/19/2013 1510   CO2 29 10/19/2017 1532   CO2 28 02/19/2013 1510   GLUCOSE 125 (H) 10/19/2017 1532   GLUCOSE 79 02/19/2013 1510   BUN 15 10/19/2017 1532   BUN 16 02/19/2013 1510   CREATININE 0.98 10/19/2017 1532   CREATININE 0.90 02/19/2013 1510   CALCIUM 9.3 10/19/2017 1532   CALCIUM 8.9 02/19/2013 1510   GFRNONAA >60 05/24/2016 0516   GFRNONAA >60 02/19/2013 1510   GFRAA >60 05/24/2016 0516   GFRAA >60 02/19/2013 1510    Lipid Panel     Component Value Date/Time   CHOL 171 10/19/2017 1532   TRIG 440.0 (H) 10/19/2017 1532   HDL 49.60 10/19/2017 1532   CHOLHDL 3 10/19/2017 1532   VLDL 59 (H) 12/17/2013 1253   LDLCALC 39 12/17/2013 1253    CBC    Component Value Date/Time   WBC 6.3 10/19/2017 1532   RBC 4.76 10/19/2017 1532   HGB 14.4 10/19/2017 1532  HGB 14.0 02/19/2013 1510   HCT 41.0 10/19/2017 1532   HCT 42.1 02/19/2013 1510   PLT 205.0 10/19/2017 1532   PLT 219 02/19/2013 1510   MCV 86.0 10/19/2017 1532   MCV 85 02/19/2013 1510   MCH 29.5 05/22/2016 0952   MCHC 35.2 10/19/2017  1532   RDW 14.3 10/19/2017 1532   RDW 13.3 02/19/2013 1510   LYMPHSABS 2.1 05/18/2016 1108   LYMPHSABS 2.4 02/19/2013 1510   MONOABS 0.5 05/18/2016 1108   MONOABS 0.5 02/19/2013 1510   EOSABS 0.3 05/18/2016 1108   EOSABS 0.4 02/19/2013 1510   BASOSABS 0.1 05/18/2016 1108   BASOSABS 0.1 02/19/2013 1510    Hgb A1C Lab Results  Component Value Date   HGBA1C 7.7 (H) 10/19/2017         Assessment and Plan:  See problem based charting  Follow Up Instructions:    I discussed the assessment and treatment plan with the patient. The patient was provided an opportunity to ask questions and all were answered. The patient agreed with the plan and demonstrated an understanding of the instructions.   The patient was advised to call back or seek an in-person evaluation if the symptoms worsen or if the condition fails to improve as anticipated.     Nicki Reaperegina Kashish Yglesias, NP

## 2018-12-18 ENCOUNTER — Encounter: Payer: Self-pay | Admitting: Internal Medicine

## 2018-12-18 NOTE — Assessment & Plan Note (Signed)
Encouraged DASH diet and exercise for weight loss Continue Metoprolol Will have him set up nurse visit for BP check Will have him set up lab only appt for CMET

## 2018-12-18 NOTE — Patient Instructions (Signed)
Fat and Cholesterol Restricted Eating Plan Getting too much fat and cholesterol in your diet Slider cause health problems. Choosing the right foods helps keep your fat and cholesterol at normal levels. This can keep you from getting certain diseases. Your doctor [place] recommend an eating plan that includes:  Total fat: ______% or less of total calories a day.  Saturated fat: ______% or less of total calories a day.  Cholesterol: less than _________mg a day.  Fiber: ______g a day. What are tips for following this plan? Meal planning  At meals, divide your plate into four equal parts: ? Fill one-half of your plate with vegetables and green salads. ? Fill one-fourth of your plate with whole grains. ? Fill one-fourth of your plate with low-fat (lean) protein foods.  Eat fish that is high in omega-3 fats at least two times a week. This includes mackerel, tuna, sardines, and salmon.  Eat foods that are high in fiber, such as whole grains, beans, apples, broccoli, carrots, peas, and barley. General tips   Work with your doctor to lose weight if you need to.  Avoid: ? Foods with added sugar. ? Fried foods. ? Foods with partially hydrogenated oils.  Limit alcohol intake to no more than 1 drink a day for nonpregnant women and 2 drinks a day for men. One drink equals 12 oz of beer, 5 oz of wine, or 1 oz of hard liquor. Reading food labels  Check food labels for: ? Trans fats. ? Partially hydrogenated oils. ? Saturated fat (g) in each serving. ? Cholesterol (mg) in each serving. ? Fiber (g) in each serving.  Choose foods with healthy fats, such as: ? Monounsaturated fats. ? Polyunsaturated fats. ? Omega-3 fats.  Choose grain products that have whole grains. Look for the word "whole" as the first word in the ingredient list. Cooking  Cook foods using low-fat methods. These include baking, boiling, grilling, and broiling.  Eat more home-cooked foods. Eat at restaurants and buffets  less often.  Avoid cooking using saturated fats, such as butter, cream, palm oil, palm kernel oil, and coconut oil. Recommended foods  Fruits  All fresh, canned (in natural juice), or frozen fruits. Vegetables  Fresh or frozen vegetables (raw, steamed, roasted, or grilled). Green salads. Grains  Whole grains, such as whole wheat or whole grain breads, crackers, cereals, and pasta. Unsweetened oatmeal, bulgur, barley, quinoa, or brown rice. Corn or whole wheat flour tortillas. Meats and other protein foods  Ground beef (85% or leaner), grass-fed beef, or beef trimmed of fat. Skinless chicken or turkey. Ground chicken or turkey. Pork trimmed of fat. All fish and seafood. Egg whites. Dried beans, peas, or lentils. Unsalted nuts or seeds. Unsalted canned beans. Nut butters without added sugar or oil. Dairy  Low-fat or nonfat dairy products, such as skim or 1% milk, 2% or reduced-fat cheeses, low-fat and fat-free ricotta or cottage cheese, or plain low-fat and nonfat yogurt. Fats and oils  Tub margarine without trans fats. Light or reduced-fat mayonnaise and salad dressings. Avocado. Olive, canola, sesame, or safflower oils. The items listed above [place] not be a complete list of foods and beverages you can eat. Contact a dietitian for more information. Foods to avoid Fruits  Canned fruit in heavy syrup. Fruit in cream or butter sauce. Fried fruit. Vegetables  Vegetables cooked in cheese, cream, or butter sauce. Fried vegetables. Grains  White bread. White pasta. White rice. Cornbread. Bagels, pastries, and croissants. Crackers and snack foods that contain trans fat   and hydrogenated oils. Meats and other protein foods  Fatty cuts of meat. Ribs, chicken wings, bacon, sausage, bologna, salami, chitterlings, fatback, hot dogs, bratwurst, and packaged lunch meats. Liver and organ meats. Whole eggs and egg yolks. Chicken and turkey with skin. Fried meat. Dairy  Whole or 2% milk, cream,  half-and-half, and cream cheese. Whole milk cheeses. Whole-fat or sweetened yogurt. Full-fat cheeses. Nondairy creamers and whipped toppings. Processed cheese, cheese spreads, and cheese curds. Beverages  Alcohol. Sugar-sweetened drinks such as sodas, lemonade, and fruit drinks. Fats and oils  Butter, stick margarine, lard, shortening, ghee, or bacon fat. Coconut, palm kernel, and palm oils. Sweets and desserts  Corn syrup, sugars, honey, and molasses. Candy. Jam and jelly. Syrup. Sweetened cereals. Cookies, pies, cakes, donuts, muffins, and ice cream. The items listed above [place] not be a complete list of foods and beverages you should avoid. Contact a dietitian for more information. Summary  Choosing the right foods helps keep your fat and cholesterol at normal levels. This can keep you from getting certain diseases.  At meals, fill one-half of your plate with vegetables and green salads.  Eat high-fiber foods, like whole grains, beans, apples, carrots, peas, and barley.  Limit added sugar, saturated fats, alcohol, and fried foods. This information is not intended to replace advice given to you by your health care provider. Make sure you discuss any questions you have with your health care provider. Document Released: 01/24/2012 Document Revised: 03/28/2018 Document Reviewed: 04/11/2017 Elsevier Interactive Patient Education  2019 [employer] Inc[employer]   

## 2018-12-18 NOTE — Assessment & Plan Note (Signed)
Encouraged him to consume a low fat diet  Will have him set up a lab only appt for CMET and Lipid profile Continue Simvastatin and Zetia for now, will adjust if needed based on labs

## 2018-12-18 NOTE — Assessment & Plan Note (Signed)
No angina Encouraged him to consume a low fat diet Continue ASA, Metoprolol, Simvastatin and Zetia.

## 2018-12-18 NOTE — Assessment & Plan Note (Signed)
Stable on Trazadone Will monitor 

## 2018-12-18 NOTE — Assessment & Plan Note (Signed)
Encouraged him to consume a low carb diet and exercise for weight loss Will have him set up lab only appt for A1C and microalbumin Continue Metformin Encouraged him to perform routine foot exams Encouraged yearly eye exams, his wife will make him an appt once this pandemic is over Flu and pneumovax UTD

## 2018-12-18 NOTE — Assessment & Plan Note (Signed)
Discussed how weight loss could help improve reflux Encouraged him to stay physically active Will start Diclofenac 75 mg BID, RX sent to pharmacy Will have him start lab only appt for CMET

## 2018-12-18 NOTE — Assessment & Plan Note (Signed)
Discussed how weight loss could help improve reflux symptoms Continue Esomeprazole for now Will have him set up lab only appt for CBC and CMET

## 2018-12-19 ENCOUNTER — Telehealth: Payer: Self-pay | Admitting: Internal Medicine

## 2018-12-19 ENCOUNTER — Other Ambulatory Visit (INDEPENDENT_AMBULATORY_CARE_PROVIDER_SITE_OTHER): Payer: BLUE CROSS/BLUE SHIELD

## 2018-12-19 ENCOUNTER — Ambulatory Visit: Payer: BLUE CROSS/BLUE SHIELD

## 2018-12-19 DIAGNOSIS — K219 Gastro-esophageal reflux disease without esophagitis: Secondary | ICD-10-CM

## 2018-12-19 DIAGNOSIS — E119 Type 2 diabetes mellitus without complications: Secondary | ICD-10-CM

## 2018-12-19 DIAGNOSIS — E7800 Pure hypercholesterolemia, unspecified: Secondary | ICD-10-CM | POA: Diagnosis not present

## 2018-12-19 DIAGNOSIS — F5101 Primary insomnia: Secondary | ICD-10-CM

## 2018-12-19 DIAGNOSIS — I2510 Atherosclerotic heart disease of native coronary artery without angina pectoris: Secondary | ICD-10-CM

## 2018-12-19 DIAGNOSIS — I10 Essential (primary) hypertension: Secondary | ICD-10-CM

## 2018-12-19 DIAGNOSIS — M1990 Unspecified osteoarthritis, unspecified site: Secondary | ICD-10-CM | POA: Diagnosis not present

## 2018-12-19 LAB — LIPID PANEL
Cholesterol: 144 mg/dL (ref 0–200)
HDL: 46.6 mg/dL (ref >39.00)
NonHDL: 97.78
Total CHOL/HDL Ratio: 3
Triglycerides: 297 mg/dL — ABNORMAL HIGH (ref 0.0–149.0)
VLDL: 59.4 mg/dL — ABNORMAL HIGH (ref 0.0–40.0)

## 2018-12-19 LAB — COMPREHENSIVE METABOLIC PANEL
ALT: 25 U/L (ref 0–53)
AST: 24 U/L (ref 0–37)
Albumin: 3.8 g/dL (ref 3.5–5.2)
Alkaline Phosphatase: 63 U/L (ref 39–117)
BUN: 20 mg/dL (ref 6–23)
CO2: 26 mEq/L (ref 19–32)
Calcium: 8.5 mg/dL (ref 8.4–10.5)
Chloride: 102 mEq/L (ref 96–112)
Creatinine, Ser: 0.86 mg/dL (ref 0.40–1.50)
GFR: 89.52 mL/min (ref >60.00)
Glucose, Bld: 260 mg/dL — ABNORMAL HIGH (ref 70–99)
Potassium: 4.3 mEq/L (ref 3.5–5.1)
Sodium: 135 mEq/L (ref 135–145)
Total Bilirubin: 0.4 mg/dL (ref 0.2–1.2)
Total Protein: 6.3 g/dL (ref 6.0–8.3)

## 2018-12-19 LAB — CBC
HCT: 41.9 % (ref 39.0–52.0)
Hemoglobin: 14.5 g/dL (ref 13.0–17.0)
MCHC: 34.6 g/dL (ref 30.0–36.0)
MCV: 86.8 fl (ref 78.0–100.0)
Platelets: 196 10*3/uL (ref 150.0–400.0)
RBC: 4.83 Mil/uL (ref 4.22–5.81)
RDW: 13.3 % (ref 11.5–15.5)
WBC: 6.2 10*3/uL (ref 4.0–10.5)

## 2018-12-19 LAB — MICROALBUMIN / CREATININE URINE RATIO
Creatinine,U: 128.9 mg/dL
Microalb Creat Ratio: 0.5 mg/g (ref 0.0–30.0)
Microalb, Ur: <0.7 mg/dL (ref 0.0–1.9)

## 2018-12-19 LAB — LDL CHOLESTEROL, DIRECT: Direct LDL: 65 mg/dL

## 2018-12-19 NOTE — Addendum Note (Signed)
Addended by: Alvina Chou on: 12/19/2018 02:22 PM   Modules accepted: Orders

## 2018-12-19 NOTE — Telephone Encounter (Signed)
Pt scheduled for BP check today at 2:00.

## 2018-12-20 LAB — HEMOGLOBIN A1C: Hgb A1c MFr Bld: 7.7 % — ABNORMAL HIGH (ref 4.6–6.5)

## 2019-01-02 ENCOUNTER — Telehealth: Payer: Self-pay | Admitting: Cardiology

## 2019-01-02 NOTE — Telephone Encounter (Signed)
pre-reg complete, CALL PT MOBILE [phone], mychart pending, verbal consent given 01/02/2019 MS

## 2019-01-03 NOTE — Progress Notes (Signed)
@Patient  ID: [name], male    DOB: 12-08-1955, 63 y.o.   MRN: [id number]  Chief Complaint  Patient presents with  . Follow-up    prescription refills, sleeping ok    Referring provider: Lorre Munroe, NP  HPI: 63 year old male, former smoker. PMH significant for asthma, seasonal allergic rhinitis, GERD, insomnia. Patient of Dr. Maple Hudson, last seen on 02/15/18. Maintained on Trazodone for insomnia.   01/04/2019 Patient presents today for 1 year follow-up. He is doing well, no acute compliaints. Asthma symptoms are controlled with prn Albuterol, uses his rescue inhaler 1-2 times a day at most. Seasonal allergies make his symptoms worse, continues Claritin and Flonase. He has not had to use his Epipen recently. Asking to have zpack on hand for asthmatic bronchitis symptoms. Sleeping well, takes trazodone at 10pm prior to bedtime and wakes up at 7am. Needing 90 days refill because insurance is changing.   Office Spirometry 07/07/2015-mild restriction consistent with his abdominal obesity. FVC 3.28/74%, FEV1 2.71/78%, FEV1/FVC 0.83  Allergies  Allergen Reactions  . 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 Niacin And Related Other (See Comments)    unknown    Immunization History  Administered Date(s) Administered  . Influenza Split 05/30/2011, 04/16/2012, 05/08/2013, 05/08/2014, 05/08/2016  . Influenza Whole 05/08/2008, 08/25/2009, 05/25/2010  . Influenza,inj,Quad PF,6+ Mos 06/08/2015, 04/28/2017, 05/18/2018  . Pneumococcal Polysaccharide-23 05/25/2010, 10/19/2017, 02/09/2018  . Tdap 08/04/2015  . Zoster Recombinat (Shingrix) 02/09/2018, 05/18/2018    Past Medical History:  Diagnosis Date  . Allergic rhinitis   . Arthritis   . Arthritis of left acromioclavicular joint 07/25/2014  . Asthma   . Chronic bronchitis (HCC)   . Chronic kidney  disease    H/O STONES  . Colon polyp   . Coronary artery disease   . Cramps, muscle, general   . Diabetes mellitus without complication (HCC)   . Diverticula of colon   . GERD (gastroesophageal reflux disease)   . Hx of heart bypass surgery 2005  . Hypercholesteremia   . Hypertension   . Impingement syndrome of left shoulder 07/25/2014  . Laceration of brachial artery 03/11/2016   left arm  . Leg cramps   . Lumbar disc disease   . Myocardial infarction (HCC)   . Partial nontraumatic rupture of right rotator cuff 10/20/2016    Tobacco History: Social History   Tobacco Use  Smoking Status Former Smoker  . Packs/day: 0.00  . Years: 10.00  . Pack years: 0.00  . Types: Cigars  . Last attempt to quit: 08/09/2003  . Years since quitting: 15.4  Smokeless Tobacco Former Neurosurgeon  Tobacco Comment   16/per day   Counseling given: Not Answered Comment: 16/per day   Outpatient Medications Prior to Visit  Medication Sig Dispense Refill  . aspirin EC 81 MG tablet Take 81 mg by mouth daily.    . diclofenac (VOLTAREN) 75 MG EC tablet Take 1 tablet (75 mg total) by mouth 2 (two) times daily. 60 tablet 2  . diphenhydrAMINE (BENADRYL) 25 mg capsule Take 1 capsule (25 mg total) by mouth every 8 (eight) hours as needed for itching.    . esomeprazole (NEXIUM) 40 MG capsule Take 1 capsule (40 mg total) by mouth daily at 12 noon. MUST SCHEDULE ANNUAL PHYSICAL 90 capsule 0  . ezetimibe (ZETIA) 10 MG  tablet Take 1 tablet (10 mg total) by mouth daily. 90 tablet 0  . gabapentin (NEURONTIN) 300 MG capsule Take 3 capsules (900 mg total) by mouth at bedtime. 270 capsule 0  . magnesium oxide (MAG-OX) 400 MG tablet Take 800 mg by mouth at bedtime.     . meclizine (ANTIVERT) 25 MG tablet Take 1 tablet (25 mg total) by mouth 3 (three) times daily as needed for dizziness. 30 tablet 0  . metFORMIN (GLUCOPHAGE) 1000 MG tablet Take 1 tablet (1,000 mg total) by mouth 2 (two) times daily with a meal. 180 tablet 3   . metoprolol succinate (TOPROL-XL) 25 MG 24 hr tablet Take 1 tablet (25 mg total) by mouth at bedtime. 90 tablet 0  . sennosides-docusate sodium (SENOKOT-S) 8.6-50 MG tablet Take 2 tablets by mouth daily. 30 tablet 1  . simvastatin (ZOCOR) 40 MG tablet Take 1 tablet (40 mg total) by mouth at bedtime. 90 tablet 0  . albuterol (PROAIR HFA) 108 (90 Base) MCG/ACT inhaler Inhale 2 puffs every 6 hours if needed for breathing 1 Inhaler 12  . EPIPEN 2-PAK 0.3 MG/0.3ML SOAJ injection INJECT ONE SYRINGEFUL INTO THE MUSCLE ONCE AS NEEDED FOR ALLERGIC REACTION 1 Device 6  . fluticasone (FLONASE) 50 MCG/ACT nasal spray Place 2 sprays into both nostrils at bedtime. 16 g 12  . ipratropium-albuterol (DUONEB) 0.5-2.5 (3) MG/3ML SOLN Take 3 mLs by nebulization as needed. 360 mL 12  . loratadine (CLARITIN) 10 MG tablet Take 1 tablet (10 mg total) by mouth daily. 30 tablet 12  . traZODone (DESYREL) 100 MG tablet TAKE ONE & ONE-HALF TABLETS BY MOUTH AT BEDTIME (Patient taking differently: 200 mg at bedtime. TAKE ONE & ONE-HALF TABLETS BY MOUTH AT BEDTIME) 45 tablet 5  . nitroGLYCERIN (NITROSTAT) 0.4 MG SL tablet Place 1 tablet (0.4 mg total) under the tongue every 5 (five) minutes as needed for chest pain. 25 tablet 0   No facility-administered medications prior to visit.     Review of Systems  Review of Systems  Constitutional: Negative.   HENT: Negative.   Respiratory: Positive for shortness of breath. Negative for cough and wheezing.   Cardiovascular: Negative.   Psychiatric/Behavioral: Negative for sleep disturbance.    Physical Exam  BP 120/64 (BP Location: Left Arm, Cuff Size: Normal)   Pulse 72   Temp 98.2 F (36.8 C)   Ht  (1.727 m)   Wt 205 lb 6.4 oz (93.2 kg)   SpO2 96%   BMI 31.23 kg/m  Physical Exam Constitutional:      Appearance: He is well-developed. He is obese. He is not ill-appearing.  HENT:     Head: Normocephalic and atraumatic.     Right Ear: Tympanic membrane normal.      Left Ear: Tympanic membrane normal.     Mouth/Throat:     Mouth: Mucous membranes are moist.     Pharynx: Oropharynx is clear.  Eyes:     Pupils: Pupils are equal, round, and reactive to light.  Neck:     Musculoskeletal: Normal range of motion and neck supple.  Cardiovascular:     Rate and Rhythm: Normal rate and regular rhythm.     Heart sounds: Normal heart sounds.  Pulmonary:     Effort: Pulmonary effort is normal. No respiratory distress.     Breath sounds: Normal breath sounds. No wheezing.  Skin:    General: Skin is warm and dry.     Findings: No erythema or rash.  Neurological:  General: No focal deficit present.     Mental Status: He is alert and oriented to person, place, and time. Mental status is at baseline.  Psychiatric:        Mood and Affect: Mood normal.        Behavior: Behavior normal.        Thought Content: Thought content normal.        Judgment: Judgment normal.      Lab Results:  CBC    Component Value Date/Time   WBC 6.2 12/19/2018 1421   RBC 4.83 12/19/2018 1421   HGB 14.5 12/19/2018 1421   HGB 14.0 02/19/2013 1510   HCT 41.9 12/19/2018 1421   HCT 42.1 02/19/2013 1510   PLT 196.0 12/19/2018 1421   PLT 219 02/19/2013 1510   MCV 86.8 12/19/2018 1421   MCV 85 02/19/2013 1510   MCH 29.5 05/22/2016 0952   MCHC 34.6 12/19/2018 1421   RDW 13.3 12/19/2018 1421   RDW 13.3 02/19/2013 1510   LYMPHSABS 2.1 05/18/2016 1108   LYMPHSABS 2.4 02/19/2013 1510   MONOABS 0.5 05/18/2016 1108   MONOABS 0.5 02/19/2013 1510   EOSABS 0.3 05/18/2016 1108   EOSABS 0.4 02/19/2013 1510   BASOSABS 0.1 05/18/2016 1108   BASOSABS 0.1 02/19/2013 1510    BMET    Component Value Date/Time   NA 135 12/19/2018 1421   NA 140 02/19/2013 1510   K 4.3 12/19/2018 1421   K 4.0 02/19/2013 1510   CL 102 12/19/2018 1421   CL 106 02/19/2013 1510   CO2 26 12/19/2018 1421   CO2 28 02/19/2013 1510   GLUCOSE 260 (H) 12/19/2018 1421   GLUCOSE 79 02/19/2013 1510   BUN  20 12/19/2018 1421   BUN 16 02/19/2013 1510   CREATININE 0.86 12/19/2018 1421   CREATININE 0.90 02/19/2013 1510   CALCIUM 8.5 12/19/2018 1421   CALCIUM 8.9 02/19/2013 1510   GFRNONAA >60 05/24/2016 0516   GFRNONAA >60 02/19/2013 1510   GFRAA >60 05/24/2016 0516   GFRAA >60 02/19/2013 1510    BNP No results found for: BNP  ProBNP    Component Value Date/Time   PROBNP  01/22/2008 2040    <30.0 CORRECTED RESULTS CALLED TO: CRYSTAL SIMMONS,RN AT 0946 01/23/08 BY K BARR CORRECTED ON 06/17 AT 0941: PREVIOUSLY REPORTED AS 27.0    Imaging: No results found.   Assessment & Plan:   Insomnia - Difficulty initiating and maintaining sleep - Taking Trazodone 200mg  at 10pm  - Get 8-9 hours of sleep  - Needs refills   Seasonal and perennial allergic rhinitis - Continue Flonase and Claritin  Allergy to insect bites - Allergy to bees - Needs refill Epipen    Glenford Bayley, NP 01/04/2019
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
## 2019-01-04 ENCOUNTER — Ambulatory Visit (INDEPENDENT_AMBULATORY_CARE_PROVIDER_SITE_OTHER): Payer: BLUE CROSS/BLUE SHIELD | Admitting: Primary Care

## 2019-01-04 ENCOUNTER — Other Ambulatory Visit: Payer: Self-pay

## 2019-01-04 ENCOUNTER — Encounter: Payer: Self-pay | Admitting: Cardiology

## 2019-01-04 ENCOUNTER — Telehealth (INDEPENDENT_AMBULATORY_CARE_PROVIDER_SITE_OTHER): Payer: BLUE CROSS/BLUE SHIELD | Admitting: Cardiology

## 2019-01-04 ENCOUNTER — Encounter: Payer: Self-pay | Admitting: Primary Care

## 2019-01-04 VITALS — BP 120/64 | HR 72 | Temp 98.2°F | Ht 68.0 in | Wt 205.4 lb

## 2019-01-04 VITALS — BP 120/64 | HR 72 | Ht 68.0 in | Wt 205.0 lb

## 2019-01-04 DIAGNOSIS — Z7189 Other specified counseling: Secondary | ICD-10-CM

## 2019-01-04 DIAGNOSIS — E118 Type 2 diabetes mellitus with unspecified complications: Secondary | ICD-10-CM

## 2019-01-04 DIAGNOSIS — E785 Hyperlipidemia, unspecified: Secondary | ICD-10-CM

## 2019-01-04 DIAGNOSIS — G4709 Other insomnia: Secondary | ICD-10-CM | POA: Diagnosis not present

## 2019-01-04 DIAGNOSIS — J3089 Other allergic rhinitis: Secondary | ICD-10-CM | POA: Diagnosis not present

## 2019-01-04 DIAGNOSIS — I10 Essential (primary) hypertension: Secondary | ICD-10-CM

## 2019-01-04 DIAGNOSIS — J302 Other seasonal allergic rhinitis: Secondary | ICD-10-CM

## 2019-01-04 DIAGNOSIS — Z91038 Other insect allergy status: Secondary | ICD-10-CM

## 2019-01-04 DIAGNOSIS — I2510 Atherosclerotic heart disease of native coronary artery without angina pectoris: Secondary | ICD-10-CM

## 2019-01-04 MED ORDER — TRAZODONE HCL 100 MG PO TABS: 200.0000 mg | ORAL_TABLET | Freq: Every day | ORAL | 3 refills | Status: AC

## 2019-01-04 MED ORDER — EZETIMIBE 10 MG PO TABS: 10.0000 mg | ORAL_TABLET | Freq: Every day | ORAL | 3 refills | Status: AC

## 2019-01-04 MED ORDER — FLUTICASONE PROPIONATE 50 MCG/ACT NA SUSP: 2.0000 | Freq: Every day | NASAL | 3 refills | Status: AC

## 2019-01-04 MED ORDER — EPIPEN 2-PAK 0.3 MG/0.3ML IJ SOAJ: INTRAMUSCULAR | 6 refills | Status: AC

## 2019-01-04 MED ORDER — SIMVASTATIN 40 MG PO TABS: 40.0000 mg | ORAL_TABLET | Freq: Every day | ORAL | 3 refills | Status: AC

## 2019-01-04 MED ORDER — AZITHROMYCIN 250 MG PO TABS: ORAL_TABLET | ORAL | 0 refills | Status: DC

## 2019-01-04 MED ORDER — LORATADINE 10 MG PO TABS: 10.0000 mg | ORAL_TABLET | Freq: Every day | ORAL | 3 refills | Status: DC

## 2019-01-04 MED ORDER — METOPROLOL SUCCINATE ER 25 MG PO TB24: 25.0000 mg | ORAL_TABLET | Freq: Every day | ORAL | 3 refills | Status: AC

## 2019-01-04 MED ORDER — IPRATROPIUM-ALBUTEROL 0.5-2.5 (3) MG/3ML IN SOLN: 3.0000 mL | Freq: Four times a day (QID) | RESPIRATORY_TRACT | 3 refills | Status: AC | PRN

## 2019-01-04 MED ORDER — NITROGLYCERIN 0.4 MG SL SUBL: 0.4000 mg | SUBLINGUAL_TABLET | SUBLINGUAL | 3 refills | Status: AC | PRN

## 2019-01-04 MED ORDER — ALBUTEROL SULFATE HFA 108 (90 BASE) MCG/ACT IN AERS: INHALATION_SPRAY | RESPIRATORY_TRACT | 3 refills | Status: AC

## 2019-01-04 MED ORDER — ESOMEPRAZOLE MAGNESIUM 40 MG PO CPDR: 40.0000 mg | DELAYED_RELEASE_CAPSULE | Freq: Every day | ORAL | 3 refills | Status: AC

## 2019-01-04 MED ORDER — FLUTICASONE PROPIONATE 50 MCG/ACT NA SUSP: 2.0000 | Freq: Every day | NASAL | 3 refills | Status: DC

## 2019-01-04 MED ORDER — LORATADINE 10 MG PO TABS: 10.0000 mg | ORAL_TABLET | Freq: Every day | ORAL | 3 refills | Status: AC

## 2019-01-04 MED ORDER — TRAZODONE HCL 100 MG PO TABS: 200.0000 mg | ORAL_TABLET | Freq: Every day | ORAL | 3 refills | Status: DC

## 2019-01-04 MED ORDER — IPRATROPIUM-ALBUTEROL 0.5-2.5 (3) MG/3ML IN SOLN: 3.0000 mL | Freq: Four times a day (QID) | RESPIRATORY_TRACT | 3 refills | Status: DC | PRN

## 2019-01-04 NOTE — Assessment & Plan Note (Signed)
Continue Flonase and Claritin 

## 2019-01-04 NOTE — Assessment & Plan Note (Signed)
-   Allergy to bees - Needs refill Epipen

## 2019-01-04 NOTE — Patient Instructions (Signed)
Medication Instructions:  Continue current medications  If you need a refill on your cardiac medications before your next appointment, please call your pharmacy.  Labwork: None Ordered   Testing/Procedures: None Ordered  Follow-Up: You will need a follow up appointment in 6 months.  Please call our office 2 months in advance to schedule this appointment.  You Ames[place]ita see James Hochrein, MD or one of the following Advanced Practice Providers on your designated Care Team:   Rhonda Barrett, PA-C . Kathryn Lawrence, DNP, ANP    At [facility], you and your health needs are our priority.  As part of our continuing mission to provide you with exceptional heart care, we have created designated Provider Care Teams.  These Care Teams include your primary Cardiologist (physician) and Advanced Practice Providers (APPs -  Physician Assistants and Nurse Practitioners) who all work together to provide you with the care you need, when you need it.  Thank you for choosing [facility] at [facility]!!     

## 2019-01-04 NOTE — Assessment & Plan Note (Signed)
-   Difficulty initiating and maintaining sleep - Taking Trazodone 200mg  at 10pm  - Get 8-9 hours of sleep  - Needs refills

## 2019-01-04 NOTE — Assessment & Plan Note (Signed)
-   Stable; breathing does not wake him - Uses Albuterol hfa once daily/ duoneb prior to bedtime every other day

## 2019-01-04 NOTE — Patient Instructions (Signed)
90 day Refills sent to pharmacy  Continue Trazodone 200mg  at bedtime (do no combine with other sedating medication, drink alcohol when taking or drive)  Continue Claritin and Flonase daily for allergy symptoms  Continue Albuterol rescue inhaler/neb every 4-6 hours for shortness of breath/wheezing (if you are using rescue inhaler more frequently please notify office)  Follow up in 1 year or sooner if needed     Insomnia Insomnia is a sleep disorder that makes it difficult to fall asleep or stay asleep. Insomnia can cause fatigue, low energy, difficulty concentrating, mood swings, and poor performance at work or school. There are three different ways to classify insomnia:  Difficulty falling asleep.  Difficulty staying asleep.  Waking up too early in the morning. Any type of insomnia can be long-term (chronic) or short-term (acute). Both are common. Short-term insomnia usually lasts for three months or less. Chronic insomnia occurs at least three times a week for longer than three months. What are the causes? Insomnia Kuna be caused by another condition, situation, or substance, such as:  Anxiety.  Certain medicines.  Gastroesophageal reflux disease (GERD) or other gastrointestinal conditions.  Asthma or other breathing conditions.  Restless legs syndrome, sleep apnea, or other sleep disorders.  Chronic pain.  Menopause.  Stroke.  Abuse of alcohol, tobacco, or illegal drugs.  Mental health conditions, such as depression.  Caffeine.  Neurological disorders, such as Alzheimer's disease.  An overactive thyroid (hyperthyroidism). Sometimes, the cause of insomnia Ruby not be known. What increases the risk? Risk factors for insomnia include:  Gender. Women are affected more often than men.  Age. Insomnia is more common as you get older.  Stress.  Lack of exercise.  Irregular work schedule or working night shifts.  Traveling between different time zones.   Certain medical and mental health conditions. What are the signs or symptoms? If you have insomnia, the main symptom is having trouble falling asleep or having trouble staying asleep. This Leverich lead to other symptoms, such as:  Feeling fatigued or having low energy.  Feeling nervous about going to sleep.  Not feeling rested in the morning.  Having trouble concentrating.  Feeling irritable, anxious, or depressed. How is this diagnosed? This condition Creveling be diagnosed based on:  Your symptoms and medical history. Your health care provider Awad ask about: ? Your sleep habits. ? Any medical conditions you have. ? Your mental health.  A physical exam. How is this treated? Treatment for insomnia depends on the cause. Treatment Cuffie focus on treating an underlying condition that is causing insomnia. Treatment Salvaggio also include:  Medicines to help you sleep.  Counseling or therapy.  Lifestyle adjustments to help you sleep better. Follow these instructions at home: Eating and drinking   Limit or avoid alcohol, caffeinated beverages, and cigarettes, especially close to bedtime. These can disrupt your sleep.  Do not eat a large meal or eat spicy foods right before bedtime. This can lead to digestive discomfort that can make it hard for you to sleep. Sleep habits   Keep a sleep diary to help you and your health care provider figure out what could be causing your insomnia. Write down: ? When you sleep. ? When you wake up during the night. ? How well you sleep. ? How rested you feel the next day. ? Any side effects of medicines you are taking. ? What you eat and drink.  Make your bedroom a dark, comfortable place where it is easy to fall asleep. ? Put  up shades or blackout curtains to block light from outside. ? Use a white noise machine to block noise. ? Keep the temperature cool.  Limit screen use before bedtime. This includes: ? Watching TV. ? Using your smartphone, tablet, or  computer.  Stick to a routine that includes going to bed and waking up at the same times every day and night. This can help you fall asleep faster. Consider making a quiet activity, such as reading, part of your nighttime routine.  Try to avoid taking naps during the day so that you sleep better at night.  Get out of bed if you are still awake after 15 minutes of trying to sleep. Keep the lights down, but try reading or doing a quiet activity. When you feel sleepy, go back to bed. General instructions  Take over-the-counter and prescription medicines only as told by your health care provider.  Exercise regularly, as told by your health care provider. Avoid exercise starting several hours before bedtime.  Use relaxation techniques to manage stress. Ask your health care provider to suggest some techniques that Pester work well for you. These Szeto include: ? Breathing exercises. ? Routines to release muscle tension. ? Visualizing peaceful scenes.  Make sure that you drive carefully. Avoid driving if you feel very sleepy.  Keep all follow-up visits as told by your health care provider. This is important. Contact a health care provider if:  You are tired throughout the day.  You have trouble in your daily routine due to sleepiness.  You continue to have sleep problems, or your sleep problems get worse. Get help right away if:  You have serious thoughts about hurting yourself or someone else. If you ever feel like you Westhoff hurt yourself or others, or have thoughts about taking your own life, get help right away. You can go to your nearest emergency department or call:  Your local emergency services (911 in the [place]  A suicide crisis helpline, such as the National Suicide Prevention Lifeline at 984 305 2693. This is open 24 hours a day. Summary  Insomnia is a sleep disorder that makes it difficult to fall asleep or stay asleep.  Insomnia can be long-term (chronic) or short-term  (acute).  Treatment for insomnia depends on the cause. Treatment Hausman focus on treating an underlying condition that is causing insomnia.  Keep a sleep diary to help you and your health care provider figure out what could be causing your insomnia. This information is not intended to replace advice given to you by your health care provider. Make sure you discuss any questions you have with your health care provider. Document Released: 07/22/2000 Document Revised: 05/04/2017 Document Reviewed: 05/04/2017 Elsevier Interactive Patient Education  2019 Elsevier Inc.  Asthma, Adult  Asthma is a long-term (chronic) condition in which the airways get tight and narrow. The airways are the breathing passages that lead from the nose and mouth down into the lungs. A person with asthma will have times when symptoms get worse. These are called asthma attacks. They can cause coughing, whistling sounds when you breathe (wheezing), shortness of breath, and chest pain. They can make it hard to breathe. There is no cure for asthma, but medicines and lifestyle changes can help control it. There are many things that can bring on an asthma attack or make asthma symptoms worse (triggers). Common triggers include:  Mold.  Dust.  Cigarette smoke.  Cockroaches.  Things that can cause allergy symptoms (allergens). These include animal skin flakes (dander) and  pollen from trees or grass.  Things that pollute the air. These Smarr include household cleaners, wood smoke, smog, or chemical odors.  Cold air, weather changes, and wind.  Crying or laughing hard.  Stress.  Certain medicines or drugs.  Certain foods such as dried fruit, potato chips, and grape juice.  Infections, such as a cold or the flu.  Certain medical conditions or diseases.  Exercise or tiring activities. Asthma Fulbright be treated with medicines and by staying away from the things that cause asthma attacks. Types of medicines Jungman include:   Controller medicines. These help prevent asthma symptoms. They are usually taken every day.  Fast-acting reliever or rescue medicines. These quickly relieve asthma symptoms. They are used as needed and provide short-term relief.  Allergy medicines if your attacks are brought on by allergens.  Medicines to help control the body's defense (immune) system. Follow these instructions at home: Avoiding triggers in your home  Change your heating and air conditioning filter often.  Limit your use of fireplaces and wood stoves.  Get rid of pests (such as roaches and mice) and their droppings.  Throw away plants if you see mold on them.  Clean your floors. Dust regularly. Use cleaning products that do not smell.  Have someone vacuum when you are not home. Use a vacuum cleaner with a HEPA filter if possible.  Replace carpet with wood, tile, or vinyl flooring. Carpet can trap animal skin flakes and dust.  Use allergy-proof pillows, mattress covers, and box spring covers.  Wash bed sheets and blankets every week in hot water. Dry them in a dryer.  Keep your bedroom free of any triggers.  Avoid pets and keep windows closed when things that cause allergy symptoms are in the air.  Use blankets that are made of polyester or cotton.  Clean bathrooms and kitchens with bleach. If possible, have someone repaint the walls in these rooms with mold-resistant paint. Keep out of the rooms that are being cleaned and painted.  Wash your hands often with soap and water. If soap and water are not available, use hand sanitizer.  Do not allow anyone to smoke in your home. General instructions  Take over-the-counter and prescription medicines only as told by your doctor. ? Talk with your doctor if you have questions about how or when to take your medicines. ? Make note if you need to use your medicines more often than usual.  Do not use any products that contain nicotine or tobacco, such as cigarettes  and e-cigarettes. If you need help quitting, ask your doctor.  Stay away from secondhand smoke.  Avoid doing things outdoors when allergen counts are high and when air quality is low.  Wear a ski mask when doing outdoor activities in the winter. The mask should cover your nose and mouth. Exercise indoors on cold days if you can.  Warm up before you exercise. Take time to cool down after exercise.  Use a peak flow meter as told by your doctor. A peak flow meter is a tool that measures how well the lungs are working.  Keep track of the peak flow meter's readings. Write them down.  Follow your asthma action plan. This is a written plan for taking care of your asthma and treating your attacks.  Make sure you get all the shots (vaccines) that your doctor recommends. Ask your doctor about a flu shot and a pneumonia shot.  Keep all follow-up visits as told by your doctor. This is  important. Contact a doctor if:  You have wheezing, shortness of breath, or a cough even while taking medicine to prevent attacks.  The mucus you cough up (sputum) is thicker than usual.  The mucus you cough up changes from clear or white to yellow, green, gray, or bloody.  You have problems from the medicine you are taking, such as: ? A rash. ? Itching. ? Swelling. ? Trouble breathing.  You need reliever medicines more than 2-3 times a week.  Your peak flow reading is still at 50-79% of your personal best after following the action plan for 1 hour.  You have a fever. Get help right away if:  You seem to be worse and are not responding to medicine during an asthma attack.  You are short of breath even at rest.  You get short of breath when doing very little activity.  You have trouble eating, drinking, or talking.  You have chest pain or tightness.  You have a fast heartbeat.  Your lips or fingernails start to turn blue.  You are light-headed or dizzy, or you faint.  Your peak flow is less  than 50% of your personal best.  You feel too tired to breathe normally. Summary  Asthma is a long-term (chronic) condition in which the airways get tight and narrow. An asthma attack can make it hard to breathe.  Asthma cannot be cured, but medicines and lifestyle changes can help control it.  Make sure you understand how to avoid triggers and how and when to use your medicines. This information is not intended to replace advice given to you by your health care provider. Make sure you discuss any questions you have with your health care provider. Document Released: 01/11/2008 Document Revised: 08/29/2016 Document Reviewed: 08/29/2016 Elsevier Interactive Patient Education  2019 [employer].

## 2019-02-18 ENCOUNTER — Ambulatory Visit: Payer: BLUE CROSS/BLUE SHIELD | Admitting: Internal Medicine

## 2019-02-21 ENCOUNTER — Telehealth: Payer: Self-pay | Admitting: Internal Medicine

## 2019-02-21 NOTE — Telephone Encounter (Signed)
I don't agree with the request. He is at increased risk of dangerous complications from Covid infection, and a face covering of some sort is one of the best current ways to protect him, along with social distancing and hand washing, until we have a vaccine.

## 2019-02-21 NOTE — Telephone Encounter (Signed)
Spoke with pt's spouse Golda Acre, requesting CY write a letter to exempt pt from wearing a mask in public d/t his health conditions.  Pt is currently being treated by our office for insomnia and mild intermittent asthmatic bronchitis.  CY please advise.  Thanks.

## 2019-02-21 NOTE — Telephone Encounter (Signed)
ATC pt and/or Sybil, line went to voicemail. LMTCB X1.

## 2019-02-22 NOTE — Telephone Encounter (Signed)
Spoke with Sybil and notified of recs per CDY  She verbalized understanding  Nothing further needed

## 2019-04-07 ENCOUNTER — Other Ambulatory Visit: Payer: Self-pay | Admitting: Internal Medicine

## 2019-04-08 ENCOUNTER — Ambulatory Visit (INDEPENDENT_AMBULATORY_CARE_PROVIDER_SITE_OTHER): Payer: BLUE CROSS/BLUE SHIELD | Admitting: Podiatry

## 2019-04-08 ENCOUNTER — Encounter: Payer: Self-pay | Admitting: Podiatry

## 2019-04-08 ENCOUNTER — Other Ambulatory Visit: Payer: Self-pay | Admitting: Podiatry

## 2019-04-08 ENCOUNTER — Ambulatory Visit (INDEPENDENT_AMBULATORY_CARE_PROVIDER_SITE_OTHER): Payer: BLUE CROSS/BLUE SHIELD

## 2019-04-08 ENCOUNTER — Other Ambulatory Visit: Payer: Self-pay

## 2019-04-08 DIAGNOSIS — R252 Cramp and spasm: Secondary | ICD-10-CM | POA: Diagnosis not present

## 2019-04-08 DIAGNOSIS — M722 Plantar fascial fibromatosis: Secondary | ICD-10-CM

## 2019-04-08 DIAGNOSIS — M7750 Other enthesopathy of unspecified foot: Secondary | ICD-10-CM

## 2019-04-08 MED ORDER — MELOXICAM 15 MG PO TABS: 15.0000 mg | ORAL_TABLET | Freq: Every day | ORAL | 3 refills | Status: DC

## 2019-04-08 MED ORDER — CYCLOBENZAPRINE HCL 10 MG PO TABS: 10.0000 mg | ORAL_TABLET | Freq: Three times a day (TID) | ORAL | 0 refills | Status: DC | PRN

## 2019-04-08 MED ORDER — CYCLOBENZAPRINE HCL 10 MG PO TABS: 10.0000 mg | ORAL_TABLET | Freq: Three times a day (TID) | ORAL | 0 refills | Status: AC | PRN
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
## 2019-04-08 NOTE — Patient Instructions (Signed)

## 2019-04-29 ENCOUNTER — Ambulatory Visit: Payer: BLUE CROSS/BLUE SHIELD | Admitting: Internal Medicine

## 2019-04-30 ENCOUNTER — Ambulatory Visit (INDEPENDENT_AMBULATORY_CARE_PROVIDER_SITE_OTHER): Payer: BC Managed Care – PPO | Admitting: Internal Medicine

## 2019-04-30 ENCOUNTER — Encounter: Payer: Self-pay | Admitting: Internal Medicine

## 2019-04-30 ENCOUNTER — Other Ambulatory Visit: Payer: Self-pay

## 2019-04-30 VITALS — BP 124/80 | HR 74 | Temp 98.3°F | Wt 199.0 lb

## 2019-04-30 DIAGNOSIS — G8929 Other chronic pain: Secondary | ICD-10-CM | POA: Diagnosis not present

## 2019-04-30 DIAGNOSIS — Z23 Encounter for immunization: Secondary | ICD-10-CM | POA: Diagnosis not present

## 2019-04-30 DIAGNOSIS — M545 Low back pain: Secondary | ICD-10-CM | POA: Diagnosis not present

## 2019-04-30 DIAGNOSIS — E1142 Type 2 diabetes mellitus with diabetic polyneuropathy: Secondary | ICD-10-CM | POA: Diagnosis not present

## 2019-04-30 DIAGNOSIS — R252 Cramp and spasm: Secondary | ICD-10-CM | POA: Diagnosis not present

## 2019-04-30 LAB — VITAMIN B12: Vitamin B-12: 328 pg/mL (ref 211–911)

## 2019-04-30 LAB — MAGNESIUM: Magnesium: 1.8 mg/dL (ref 1.5–2.5)

## 2019-04-30 LAB — VITAMIN D 25 HYDROXY (VIT D DEFICIENCY, FRACTURES): VITD: 36.9 ng/mL (ref 30.00–100.00)

## 2019-04-30 NOTE — Patient Instructions (Signed)
Paresthesia Paresthesia is a burning or prickling feeling. This feeling can happen in any part of the body. It often happens in the hands, arms, legs, or feet. Usually, it is not painful. In most cases, the feeling goes away in a short time and is not a sign of a serious problem. If you have paresthesia that lasts a long time, you [name] need to be seen by your doctor. Follow these instructions at home: Alcohol use   Do not drink alcohol if: ? Your doctor tells you not to drink. ? You are pregnant, [name] be pregnant, or are planning to become pregnant.  If you drink alcohol: ? Limit how much you use to:  0-1 drink a day for women.  0-2 drinks a day for men. ? Be aware of how much alcohol is in your drink. In the [place] one drink equals one 12 oz bottle of beer (355 mL), one 5 oz glass of wine (148 mL), or one 1 oz glass of hard liquor (44 mL). Nutrition   Eat a healthy diet. This includes: ? Eating foods that have a lot of fiber in them, such as fresh fruits and vegetables, whole grains, and beans. ? Limiting foods that have a lot of fat and processed sugars in them, such as fried or sweet foods. General instructions  Take over-the-counter and prescription medicines only as told by your doctor.  Do not use any products that have nicotine or tobacco in them, such as cigarettes and e-cigarettes. If you need help quitting, ask your doctor.  If you have diabetes, work with your doctor to make sure your blood sugar stays in a healthy range.  If your feet feel numb: ? Check for redness, warmth, and swelling every day. ? Wear padded socks and comfortable shoes. These help protect your feet.  Keep all follow-up visits as told by your doctor. This is important. Contact a doctor if:  You have paresthesia that gets worse or does not go away.  Your burning or prickling feeling gets worse when you walk.  You have pain or cramps.  You feel dizzy.  You have a rash. Get help right away if  you:  Feel weak.  Have trouble walking or moving.  Have problems speaking, understanding, or seeing.  Feel confused.  Cannot control when you pee (urinate) or poop (have a bowel movement).  Lose feeling (have numbness) after an injury.  Have new weakness in an arm or leg.  Pass out (faint). Summary  Paresthesia is a burning or prickling feeling. It often happens in the hands, arms, legs, or feet.  In most cases, the feeling goes away in a short time and is not a sign of a serious problem.  If you have paresthesia that lasts a long time, you [name] need to be seen by your doctor. This information is not intended to replace advice given to you by your health care provider. Make sure you discuss any questions you have with your health care provider. Document Released: 07/07/2008 Document Revised: 08/20/2018 Document Reviewed: 08/03/2017 Elsevier Patient Education  2020 Elsevier Inc.  

## 2019-04-30 NOTE — Progress Notes (Signed)
Subjective:    Patient ID: [name], male    DOB: 1956-02-11, 63 y.o.   MRN: [id number]  HPI  Pt presents to the clinic today with c/o bilateral leg cramping, burning sensation, numbness, tingling, weakness and legs feeling heavy. He reports this has been an ongoing issue but seems worse in the last 2 months. He does have a history of DM 2, last A1C was 7.7%, 12/2018. He has a history of lumbar disc disease. Lumbar MRI from 09/2015 showed:  IMPRESSION: Postop laminectomy on the right L3-4.  No recurrent disc protrusion. Disc and facet degeneration L4-5 with moderate spinal stenosis unchanged. Right laminectomy L5-S1. Disc degeneration and spurring without significant foraminal stenosis.  He has tried Magnesium, Gabapentin, Vinegar, Mustard and has been consuming plenty of fluids.   Review of Systems      Past Medical History:  Diagnosis Date  . Allergic rhinitis   . Arthritis of left acromioclavicular joint 07/25/2014  . Asthma   . Chronic bronchitis ([place])   . Chronic kidney disease    H/O STONES  . Colon polyp   . Coronary artery disease   . Diabetes mellitus without complication ([place])   . Diverticula of colon   . GERD (gastroesophageal reflux disease)   . Hx of heart bypass surgery 2005  . Hypercholesteremia   . Impingement syndrome of left shoulder 07/25/2014  . Laceration of brachial artery 03/11/2016   left arm  . Lumbar disc disease   . Partial nontraumatic rupture of right rotator cuff 10/20/2016    Current Outpatient Medications  Medication Sig Dispense Refill  . albuterol (PROAIR HFA) 108 (90 Base) MCG/ACT inhaler Inhale 2 puffs every 6 hours if needed for breathing 3 Inhaler 3  . aspirin EC 81 MG tablet Take 81 mg by mouth daily.    . cyclobenzaprine (FLEXERIL) 10 MG tablet Take 1 tablet (10 mg total) by mouth 3 (three) times daily as needed for muscle spasms. 30 tablet 0  . diphenhydrAMINE (BENADRYL) 25 mg capsule Take 1 capsule (25 mg total) by mouth every 8  (eight) hours as needed for itching.    Marland Kitchen EPIPEN 2-PAK 0.3 MG/0.3ML SOAJ injection INJECT ONE SYRINGEFUL INTO THE MUSCLE ONCE AS NEEDED FOR ALLERGIC REACTION 1 Device 6  . esomeprazole (NEXIUM) 40 MG capsule Take 1 capsule (40 mg total) by mouth daily at 12 noon. MUST SCHEDULE ANNUAL PHYSICAL 90 capsule 3  . ezetimibe (ZETIA) 10 MG tablet Take 1 tablet (10 mg total) by mouth daily. 90 tablet 3  . fluticasone (FLONASE) 50 MCG/ACT nasal spray Place 2 sprays into both nostrils at bedtime. 48 g 3  . gabapentin (NEURONTIN) 300 MG capsule Take 3 capsules (900 mg total) by mouth at bedtime. 270 capsule 0  . ipratropium-albuterol (DUONEB) 0.5-2.5 (3) MG/3ML SOLN Take 3 mLs by nebulization every 6 (six) hours as needed. 360 mL 3  . loratadine (CLARITIN) 10 MG tablet Take 1 tablet (10 mg total) by mouth daily. 90 tablet 3  . magnesium oxide (MAG-OX) 400 MG tablet Take 800 mg by mouth at bedtime.     . metFORMIN (GLUCOPHAGE) 1000 MG tablet Take 1 tablet (1,000 mg total) by mouth 2 (two) times daily with a meal. MUST SCHEDULE PHYSICAL 180 tablet 0  . metoprolol succinate (TOPROL-XL) 25 MG 24 hr tablet Take 1 tablet (25 mg total) by mouth at bedtime. 90 tablet 3  . sennosides-docusate sodium (SENOKOT-S) 8.6-50 MG tablet Take 2 tablets by mouth daily. 30 tablet 1  .  simvastatin (ZOCOR) 40 MG tablet Take 1 tablet (40 mg total) by mouth at bedtime. 90 tablet 3  . traZODone (DESYREL) 100 MG tablet Take 2 tablets (200 mg total) by mouth at bedtime. 180 tablet 3  . nitroGLYCERIN (NITROSTAT) 0.4 MG SL tablet Place 1 tablet (0.4 mg total) under the tongue every 5 (five) minutes as needed for chest pain. 25 tablet 3   No current facility-administered medications for this visit.     Allergies  Allergen Reactions  . 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 Niacin And Related Other  (See Comments)    unknown    Family History  Problem Relation Age of Onset  . Asthma Father   . Emphysema Father   . Stroke Mother     Social History   Socioeconomic History  . Marital status: Married    Spouse name: Not on file  . Number of children: Not on file  . Years of education: Not on file  . Highest education level: Not on file  Occupational History  . Not on file  Social Needs  . Financial resource strain: Not on file  . Food insecurity    Worry: Not on file    Inability: Not on file  . Transportation needs    Medical: Not on file    Non-medical: Not on file  Tobacco Use  . Smoking status: Former Smoker    Packs/day: 0.00    Years: 10.00    Pack years: 0.00    Types: Cigars    Quit date: 08/09/2003    Years since quitting: 15.7  . Smokeless tobacco: Former Neurosurgeon  . Tobacco comment: 16/per day  Substance and Sexual Activity  . Alcohol use: No    Alcohol/week: 0.0 standard drinks    Frequency: Never  . Drug use: No  . Sexual activity: Yes  Lifestyle  . Physical activity    Days per week: Not on file    Minutes per session: Not on file  . Stress: Not on file  Relationships  . Social Musician on phone: Not on file    Gets together: Not on file    Attends religious service: Not on file    Active member of club or organization: Not on file    Attends meetings of clubs or organizations: Not on file    Relationship status: Not on file  . Intimate partner violence    Fear of current or ex partner: Not on file    Emotionally abused: Not on file    Physically abused: Not on file    Forced sexual activity: Not on file  Other Topics Concern  . Not on file  Social History Narrative   Married.  One son.  No grandchildren.       Constitutional: Denies fever, malaise, fatigue, headache or abrupt weight changes.  Respiratory: Denies difficulty breathing, shortness of breath, cough or sputum production.   Cardiovascular: Denies chest pain, chest  tightness, palpitations or swelling in the hands or feet.  Musculoskeletal: Pt reports muscle cramping in legs, weakness. Denies decrease in range of motion, difficulty with gait, or joint pain and swelling.  Skin: Denies redness, rashes, lesions or ulcercations.  Neurological: Pt reports neuropathic pain of feet. Denies problems with balance and coordination.    No other specific  complaints in a complete review of systems (except as listed in HPI above).  Objective:   Physical Exam   BP 124/80   Pulse 74   Temp 98.3 F (36.8 C) (Temporal)   Wt 199 lb (90.3 kg)   SpO2 97%   BMI 30.26 kg/m  Wt Readings from Last 3 Encounters:  04/30/19 199 lb (90.3 kg)  01/04/19 205 lb (93 kg)  01/04/19 205 lb 6.4 oz (93.2 kg)    General: Appears his stated age, obese, in NAD. Cardiovascular: No BLE edema.  Pulmonary/Chest: Normal effort and positive vesicular breath sounds. No respiratory distress. No wheezes, rales or ronchi noted.  Musculoskeletal: Normal flexion, extension and rotation of the spine. Bony tenderness noted over the lumbar spine. Strength 5/5 BLE. Able to walk on heels and toes but with difficulty. Gait slow and steady without device.  Neurological: Alert and oriented. Positive SLR bilaterally at 45 degrees. Sensation intact but decreased to bilateral lower extremities.   BMET    Component Value Date/Time   NA 135 12/19/2018 1421   NA 140 02/19/2013 1510   K 4.3 12/19/2018 1421   K 4.0 02/19/2013 1510   CL 102 12/19/2018 1421   CL 106 02/19/2013 1510   CO2 26 12/19/2018 1421   CO2 28 02/19/2013 1510   GLUCOSE 260 (H) 12/19/2018 1421   GLUCOSE 79 02/19/2013 1510   BUN 20 12/19/2018 1421   BUN 16 02/19/2013 1510   CREATININE 0.86 12/19/2018 1421   CREATININE 0.90 02/19/2013 1510   CALCIUM 8.5 12/19/2018 1421   CALCIUM 8.9 02/19/2013 1510   GFRNONAA >60 05/24/2016 0516   GFRNONAA >60 02/19/2013 1510   GFRAA >60 05/24/2016 0516   GFRAA >60 02/19/2013 1510     Lipid Panel     Component Value Date/Time   CHOL 144 12/19/2018 1421   TRIG 297.0 (H) 12/19/2018 1421   HDL 46.60 12/19/2018 1421   CHOLHDL 3 12/19/2018 1421   VLDL 59.4 (H) 12/19/2018 1421   LDLCALC 39 12/17/2013 1253    CBC    Component Value Date/Time   WBC 6.2 12/19/2018 1421   RBC 4.83 12/19/2018 1421   HGB 14.5 12/19/2018 1421   HGB 14.0 02/19/2013 1510   HCT 41.9 12/19/2018 1421   HCT 42.1 02/19/2013 1510   PLT 196.0 12/19/2018 1421   PLT 219 02/19/2013 1510   MCV 86.8 12/19/2018 1421   MCV 85 02/19/2013 1510   MCH 29.5 05/22/2016 0952   MCHC 34.6 12/19/2018 1421   RDW 13.3 12/19/2018 1421   RDW 13.3 02/19/2013 1510   LYMPHSABS 2.1 05/18/2016 1108   LYMPHSABS 2.4 02/19/2013 1510   MONOABS 0.5 05/18/2016 1108   MONOABS 0.5 02/19/2013 1510   EOSABS 0.3 05/18/2016 1108   EOSABS 0.4 02/19/2013 1510   BASOSABS 0.1 05/18/2016 1108   BASOSABS 0.1 02/19/2013 1510    Hgb A1C Lab Results  Component Value Date   HGBA1C 7.7 (H) 12/19/2018           Assessment & Plan:   Muscle Cramping BLE, DM 2 with Neuropathy, Low Back Pain with Bilateral Sciatica:  Will check Vit D, B12, Magnesium and Potassium Will repeat MRI of Lumbar spine Encouraged regular stretching Continue muscle relaxer and Gabapentin as prescribed If labs, MRI unremarkable, consider referral to neurology for EMG testing  Will follow up after results are back, return precautions discussed Nicki Reaper, NP

## 2019-05-08 ENCOUNTER — Encounter: Payer: Self-pay | Admitting: Emergency Medicine

## 2019-05-08 ENCOUNTER — Emergency Department
Admission: EM | Admit: 2019-05-08 | Discharge: 2019-05-08 | Disposition: A | Discharge status: Home / Self Care | Payer: BC Managed Care – PPO | Attending: Emergency Medicine | Admitting: Emergency Medicine

## 2019-05-08 ENCOUNTER — Emergency Department: Payer: BC Managed Care – PPO

## 2019-05-08 ENCOUNTER — Other Ambulatory Visit: Payer: Self-pay

## 2019-05-08 DIAGNOSIS — I129 Hypertensive chronic kidney disease with stage 1 through stage 4 chronic kidney disease, or unspecified chronic kidney disease: Secondary | ICD-10-CM | POA: Insufficient documentation

## 2019-05-08 DIAGNOSIS — W293XXA Contact with powered garden and outdoor hand tools and machinery, initial encounter: Secondary | ICD-10-CM | POA: Diagnosis not present

## 2019-05-08 DIAGNOSIS — Z7984 Long term (current) use of oral hypoglycemic drugs: Secondary | ICD-10-CM | POA: Diagnosis not present

## 2019-05-08 DIAGNOSIS — Y998 Other external cause status: Secondary | ICD-10-CM | POA: Diagnosis not present

## 2019-05-08 DIAGNOSIS — S61211A Laceration without foreign body of left index finger without damage to nail, initial encounter: Secondary | ICD-10-CM | POA: Insufficient documentation

## 2019-05-08 DIAGNOSIS — N189 Chronic kidney disease, unspecified: Secondary | ICD-10-CM | POA: Insufficient documentation

## 2019-05-08 DIAGNOSIS — Y929 Unspecified place or not applicable: Secondary | ICD-10-CM | POA: Diagnosis not present

## 2019-05-08 DIAGNOSIS — Z79899 Other long term (current) drug therapy: Secondary | ICD-10-CM | POA: Diagnosis not present

## 2019-05-08 DIAGNOSIS — Z87891 Personal history of nicotine dependence: Secondary | ICD-10-CM | POA: Diagnosis not present

## 2019-05-08 DIAGNOSIS — S62631A Displaced fracture of distal phalanx of left index finger, initial encounter for closed fracture: Secondary | ICD-10-CM

## 2019-05-08 DIAGNOSIS — S61213A Laceration without foreign body of left middle finger without damage to nail, initial encounter: Secondary | ICD-10-CM | POA: Diagnosis not present

## 2019-05-08 DIAGNOSIS — Y93H2 Activity, gardening and landscaping: Secondary | ICD-10-CM | POA: Diagnosis not present

## 2019-05-08 DIAGNOSIS — E1122 Type 2 diabetes mellitus with diabetic chronic kidney disease: Secondary | ICD-10-CM | POA: Insufficient documentation

## 2019-05-08 DIAGNOSIS — Z7982 Long term (current) use of aspirin: Secondary | ICD-10-CM | POA: Diagnosis not present

## 2019-05-08 DIAGNOSIS — S6992XA Unspecified injury of left wrist, hand and finger(s), initial encounter: Secondary | ICD-10-CM | POA: Diagnosis present

## 2019-05-08 DIAGNOSIS — Z951 Presence of aortocoronary bypass graft: Secondary | ICD-10-CM | POA: Insufficient documentation

## 2019-05-08 MED ORDER — SULFAMETHOXAZOLE-TRIMETHOPRIM 800-160 MG PO TABS
1.0000 | ORAL_TABLET | Freq: Once | ORAL | Status: AC
  Administered 2019-05-08: 19:00:00 1 via ORAL
  Filled 2019-05-08: qty 1

## 2019-05-08 MED ORDER — SULFAMETHOXAZOLE-TRIMETHOPRIM 800-160 MG PO TABS: 1.0000 | ORAL_TABLET | Freq: Two times a day (BID) | ORAL | 0 refills | Status: DC

## 2019-05-08 MED ORDER — LIDOCAINE HCL (PF) 1 % IJ SOLN
5.0000 mL | Freq: Once | INTRAMUSCULAR | Status: AC
  Administered 2019-05-08: 5 mL via INTRADERMAL
  Filled 2019-05-08: qty 5

## 2019-05-08 MED ORDER — HYDROCODONE-ACETAMINOPHEN 5-325 MG PO TABS: 1.0000 | ORAL_TABLET | ORAL | 0 refills | Status: AC | PRN

## 2019-05-08 MED ORDER — HYDROMORPHONE HCL 1 MG/ML IJ SOLN
1.0000 mg | Freq: Once | INTRAMUSCULAR | Status: AC
  Administered 2019-05-08: 17:00:00 1 mg via INTRAMUSCULAR
  Filled 2019-05-08: qty 1

## 2019-05-08 MED ORDER — LIDOCAINE-PRILOCAINE 2.5-2.5 % EX CREA
TOPICAL_CREAM | Freq: Once | CUTANEOUS | Status: AC
  Administered 2019-05-08: 17:00:00 via TOPICAL
  Filled 2019-05-08: qty 5
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
## 2019-05-08 NOTE — ED Notes (Signed)
Wound drsg applied by PA-C

## 2019-05-08 NOTE — ED Triage Notes (Signed)
PT has lac noted to LFT index finger, bleeding noted. PT states he cut with outside shears while trimming bushes. + numbness

## 2019-05-08 NOTE — Discharge Instructions (Signed)
Please change dressing every 2 days.  Keep finger splinted and straight.  You [place] shower and get wet.  Take antibiotics as prescribed.  Use Norco as needed for pain.

## 2019-05-13 ENCOUNTER — Other Ambulatory Visit: Payer: Self-pay

## 2019-05-13 ENCOUNTER — Ambulatory Visit
Admission: RE | Admit: 2019-05-13 | Discharge: 2019-05-13 | Disposition: A | Discharge status: Home / Self Care | Payer: BC Managed Care – PPO | Source: Ambulatory Visit | Attending: Internal Medicine | Admitting: Internal Medicine

## 2019-05-13 DIAGNOSIS — M545 Low back pain, unspecified: Secondary | ICD-10-CM

## 2019-05-13 DIAGNOSIS — G8929 Other chronic pain: Secondary | ICD-10-CM

## 2019-05-20 ENCOUNTER — Ambulatory Visit: Payer: BLUE CROSS/BLUE SHIELD | Admitting: Podiatry

## 2019-05-23 ENCOUNTER — Telehealth: Payer: Self-pay | Admitting: Internal Medicine

## 2019-05-23 MED ORDER — AZITHROMYCIN 250 MG PO TABS: 250.0000 mg | ORAL_TABLET | ORAL | 0 refills | Status: DC

## 2019-05-23 NOTE — Telephone Encounter (Signed)
Use Dx Acute bronchitis

## 2019-05-23 NOTE — Telephone Encounter (Signed)
I spoke with the pt's spouse and notified of recs per CDY  She verbalized understanding and is denies any further needs  She states that the rx for zpack must have a dx on it or they will not fill it  Please advise what dx to use  Note to triage- no need to call her back again

## 2019-05-23 NOTE — Telephone Encounter (Signed)
Primary Pulmonologist: Dr. Maple Hudson Last office visit and with whom: 01/04/19 BW What do we see them for (pulmonary problems): asthma / bronchitis Last OV assessment/plan: Instructions    Return in about 1 year (around 01/04/2020). 90 day Refills sent to pharmacy  Continue Trazodone 200mg  at bedtime (do no combine with other sedating medication, drink alcohol when taking or drive)  Continue Claritin and Flonase daily for allergy symptoms  Continue Albuterol rescue inhaler/neb every 4-6 hours for shortness of breath/wheezing (if you are using rescue inhaler more frequently please notify office)  Follow up in 1 year or sooner if needed        Was appointment offered to patient (explain)?     Reason for call: patient having congestion, productive cough -yellow mucus. Patient denies feer, shortness of breath, body aches, n/v/d. Patient requesting RX for z-pack and cough syrup.  Dr. please advise  Allergies  Allergen Reactions  . Penicillins Other (See Comments)    "Makes me pass out" Test dose of Ancef , vo Dr Maple Hudson, without reports of urticaria, no redness, no chang in vs.07-25-14 D. 07-27-14   . Niacin And Related Other (See Comments)    unknown   Current Outpatient Medications on File Prior to Visit  Medication Sig Dispense Refill  . albuterol (PROAIR HFA) 108 (90 Base) MCG/ACT inhaler Inhale 2 puffs every 6 hours if needed for breathing 3 Inhaler 3  . aspirin EC 81 MG tablet Take 81 mg by mouth daily.    . cyclobenzaprine (FLEXERIL) 10 MG tablet Take 1 tablet (10 mg total) by mouth 3 (three) times daily as needed for muscle spasms. 30 tablet 0  . diphenhydrAMINE (BENADRYL) 25 mg capsule Take 1 capsule (25 mg total) by mouth every 8 (eight) hours as needed for itching.    Air cabin crew EPIPEN 2-PAK 0.3 MG/0.3ML SOAJ injection INJECT ONE SYRINGEFUL INTO THE MUSCLE ONCE AS NEEDED FOR ALLERGIC REACTION 1 Device 6  . esomeprazole (NEXIUM) 40 MG capsule Take 1 capsule (40 mg total)  by mouth daily at 12 noon. MUST SCHEDULE ANNUAL PHYSICAL 90 capsule 3  . ezetimibe (ZETIA) 10 MG tablet Take 1 tablet (10 mg total) by mouth daily. 90 tablet 3  . fluticasone (FLONASE) 50 MCG/ACT nasal spray Place 2 sprays into both nostrils at bedtime. 48 g 3  . gabapentin (NEURONTIN) 300 MG capsule Take 3 capsules (900 mg total) by mouth at bedtime. 270 capsule 0  . HYDROcodone-acetaminophen (NORCO) 5-325 MG tablet Take 1 tablet by mouth every 4 (four) hours as needed for moderate pain. 20 tablet 0  . ipratropium-albuterol (DUONEB) 0.5-2.5 (3) MG/3ML SOLN Take 3 mLs by nebulization every 6 (six) hours as needed. 360 mL 3  . loratadine (CLARITIN) 10 MG tablet Take 1 tablet (10 mg total) by mouth daily. 90 tablet 3  . magnesium oxide (MAG-OX) 400 MG tablet Take 800 mg by mouth at bedtime.     . metFORMIN (GLUCOPHAGE) 1000 MG tablet Take 1 tablet (1,000 mg total) by mouth 2 (two) times daily with a meal. MUST SCHEDULE PHYSICAL 180 tablet 0  . metoprolol succinate (TOPROL-XL) 25 MG 24 hr tablet Take 1 tablet (25 mg total) by mouth at bedtime. 90 tablet 3  . nitroGLYCERIN (NITROSTAT) 0.4 MG SL tablet Place 1 tablet (0.4 mg total) under the tongue every 5 (five) minutes as needed for chest pain. 25 tablet 3  . sennosides-docusate sodium (SENOKOT-S) 8.6-50 MG tablet Take 2 tablets by mouth daily. 30 tablet 1  .  simvastatin (ZOCOR) 40 MG tablet Take 1 tablet (40 mg total) by mouth at bedtime. 90 tablet 3  . sulfamethoxazole-trimethoprim (BACTRIM DS) 800-160 MG tablet Take 1 tablet by mouth 2 (two) times daily. 14 tablet 0  . traZODone (DESYREL) 100 MG tablet Take 2 tablets (200 mg total) by mouth at bedtime. 180 tablet 3   No current facility-administered medications on file prior to visit.

## 2019-05-23 NOTE — Telephone Encounter (Signed)
Ok Zpak 250 mg, # 6, 2 today then one daily Since already has Noro, prefer not to add more narcotics. Recommend Otc Delsym cough syrup and throat lozenges

## 2019-06-03 ENCOUNTER — Ambulatory Visit: Payer: BC Managed Care – PPO | Admitting: Podiatry

## 2019-06-03 ENCOUNTER — Encounter: Payer: Self-pay | Admitting: Podiatry

## 2019-06-03 ENCOUNTER — Other Ambulatory Visit: Payer: Self-pay

## 2019-06-03 DIAGNOSIS — G5793 Unspecified mononeuropathy of bilateral lower limbs: Secondary | ICD-10-CM

## 2019-06-03 DIAGNOSIS — M722 Plantar fascial fibromatosis: Secondary | ICD-10-CM | POA: Diagnosis not present

## 2019-06-03 MED ORDER — GABAPENTIN 300 MG PO CAPS: ORAL_CAPSULE | ORAL | 3 refills | Status: AC

## 2019-06-03 NOTE — Progress Notes (Signed)
He presents today for follow-up of his plantar fasciitis and his neuropathy he states that the straps have helped the plantar fasciitis immensely but the neuropathy is not improved.  Objective: Vital signs are stable he is alert and oriented x3.  No change in physical exam.  He still has plantar fascial pain as well as neuropathic symptoms.  Assessment: Plantar fasciitis controlled while wearing plantar fascial braces.  Diabetic peripheral neuropathy controlled at nighttime with gabapentin.  Now worsening during the day.  Plan: Discussed etiology pathology conservative versus surgical therapies provided him with new plantar fascial braces.  Also increase his gabapentin to 1 300 mg capsule at breakfast.  1 300 mg capsule midafternoon.  And 900 mg at bedtime.  I will follow-up with him in about 2 months to reevaluate his progress he will call with questions or concerns regarding his medication or his plantar fasciitis.

## 2019-07-29 ENCOUNTER — Ambulatory Visit: Payer: BC Managed Care – PPO | Admitting: Podiatry

## 2019-08-14 ENCOUNTER — Other Ambulatory Visit: Payer: Self-pay | Admitting: Internal Medicine

## 2019-09-09 DEATH — deceased

## 2019-10-30 ENCOUNTER — Telehealth: Payer: Self-pay | Admitting: Internal Medicine

## 2019-10-30 MED ORDER — AZITHROMYCIN 250 MG PO TABS: 250.0000 mg | ORAL_TABLET | ORAL | 0 refills | Status: DC

## 2019-10-30 NOTE — Telephone Encounter (Signed)
Pt's wife, Golda Acre, returned call. She states they only have one phone and it is with Sybil, unable to speak with pt or schedule televisit. She is currently away from home all day as she is visiting her father in the hospital. Pt states she returns to home at [place] had little information that she could provide about pt's s/s. Per Golda Acre the pt is having his 'seasonal flare up' and is requesting abx. Sybil states the pt is c/o sinus congestion, unsure of mucus color, and also cough, unsure of mucus production. Sybil doesn't think the pt is having any SOB, f/c/s, CP/tightness, headache.   Dr. Annamaria Boots please advise.   Allergies  Allergen Reactions  . 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 Niacin And Related Other (See Comments)    unknown    Current Outpatient Medications on File Prior to Visit  Medication Sig Dispense Refill  . albuterol (PROAIR HFA) 108 (90 Base) MCG/ACT inhaler Inhale 2 puffs every 6 hours if needed for breathing 3 Inhaler 3  . aspirin EC 81 MG tablet Take 81 mg by mouth daily.    Marland Kitchen azithromycin (ZITHROMAX) 250 MG tablet Take 1 tablet (250 mg total) by mouth as directed. 6 tablet 0  . cyclobenzaprine (FLEXERIL) 10 MG tablet Take 1 tablet (10 mg total) by mouth 3 (three) times daily as needed for muscle spasms. 30 tablet 0  . diphenhydrAMINE (BENADRYL) 25 mg capsule Take 1 capsule (25 mg total) by mouth every 8 (eight) hours as needed for itching.    Marland Kitchen EPIPEN 2-PAK 0.3 MG/0.3ML SOAJ injection INJECT ONE SYRINGEFUL INTO THE MUSCLE ONCE AS NEEDED FOR ALLERGIC REACTION 1 Device 6  . esomeprazole (NEXIUM) 40 MG capsule Take 1 capsule (40 mg total) by mouth daily at 12 noon. MUST SCHEDULE ANNUAL PHYSICAL 90 capsule 3  . ezetimibe (ZETIA) 10 MG tablet Take 1 tablet (10 mg total) by mouth daily. 90 tablet 3  . fluticasone (FLONASE) 50 MCG/ACT  nasal spray Place 2 sprays into both nostrils at bedtime. 48 g 3  . gabapentin (NEURONTIN) 300 MG capsule Take one qAM, one qPM, three qhs 450 capsule 3  . HYDROcodone-acetaminophen (NORCO) 5-325 MG tablet Take 1 tablet by mouth every 4 (four) hours as needed for moderate pain. 20 tablet 0  . ipratropium-albuterol (DUONEB) 0.5-2.5 (3) MG/3ML SOLN Take 3 mLs by nebulization every 6 (six) hours as needed. 360 mL 3  . loratadine (CLARITIN) 10 MG tablet Take 1 tablet (10 mg total) by mouth daily. 90 tablet 3  . magnesium oxide (MAG-OX) 400 MG tablet Take 800 mg by mouth at bedtime.     . metFORMIN (GLUCOPHAGE) 1000 MG tablet TAKE 1 TABLET BY MOUTH TWICE DAILY WITH A MEAL MUST  SCHEDULE  PHYSICAL  EXAM  FOR  MORE  REFILLS 180 tablet 0  . metoprolol succinate (TOPROL-XL) 25 MG 24 hr tablet Take 1 tablet (25 mg total) by mouth at bedtime. 90 tablet 3  . nitroGLYCERIN (NITROSTAT) 0.4 MG SL tablet Place 1 tablet (0.4 mg total) under the tongue every 5 (five) minutes as needed for chest pain. 25 tablet 3  . oxyCODONE (OXY IR/ROXICODONE) 5 MG immediate release tablet Take 5 mg by mouth every 4 (four) hours.    . sennosides-docusate sodium (SENOKOT-S) 8.6-50 MG tablet Take 2 tablets by mouth  daily. 30 tablet 1  . simvastatin (ZOCOR) 40 MG tablet Take 1 tablet (40 mg total) by mouth at bedtime. 90 tablet 3  . sulfamethoxazole-trimethoprim (BACTRIM DS) 800-160 MG tablet Take 1 tablet by mouth 2 (two) times daily. 14 tablet 0  . traZODone (DESYREL) 100 MG tablet Take 2 tablets (200 mg total) by mouth at bedtime. 180 tablet 3   No current facility-administered medications on file prior to visit.

## 2019-10-30 NOTE — Telephone Encounter (Signed)
Called and spoke to pt's wife. While speaking about pt, the call was dropped. ATC pt's wife back but had to leave VM. Pt will need virtual visit as the last time he was seen was 12/2018.

## 2019-10-30 NOTE — Telephone Encounter (Signed)
Spoke with the pt's spouse and notified of recs per Dr Kreg Shropshire was sent

## 2019-10-30 NOTE — Telephone Encounter (Signed)
Offer a Zpak 250 mg, # 6, 2 today then one daily

## 2019-11-05 ENCOUNTER — Encounter: Payer: Self-pay | Admitting: Internal Medicine

## 2019-11-05 ENCOUNTER — Other Ambulatory Visit: Payer: Self-pay

## 2019-11-05 ENCOUNTER — Ambulatory Visit (INDEPENDENT_AMBULATORY_CARE_PROVIDER_SITE_OTHER): Payer: Self-pay | Admitting: Internal Medicine

## 2019-11-05 VITALS — Temp 99.7°F

## 2019-11-05 DIAGNOSIS — J988 Other specified respiratory disorders: Secondary | ICD-10-CM

## 2019-11-05 DIAGNOSIS — J4520 Mild intermittent asthma, uncomplicated: Secondary | ICD-10-CM

## 2019-11-05 DIAGNOSIS — J3089 Other allergic rhinitis: Secondary | ICD-10-CM

## 2019-11-05 DIAGNOSIS — J410 Simple chronic bronchitis: Secondary | ICD-10-CM

## 2019-11-05 DIAGNOSIS — B9789 Other viral agents as the cause of diseases classified elsewhere: Secondary | ICD-10-CM

## 2019-11-05 DIAGNOSIS — J302 Other seasonal allergic rhinitis: Secondary | ICD-10-CM

## 2019-11-05 DIAGNOSIS — E1142 Type 2 diabetes mellitus with diabetic polyneuropathy: Secondary | ICD-10-CM

## 2019-11-05 MED ORDER — HYDROCOD POLST-CPM POLST ER 10-8 MG/5ML PO SUER: 5.0000 mL | Freq: Every evening | ORAL | 0 refills | Status: AC | PRN

## 2019-11-05 NOTE — Progress Notes (Signed)
Virtual Visit via Telephone Note  I connected with Jerardo Costabile Standing on 11/05/19 at  8:15 AM EDT by telephone and verified that I am speaking with the correct person using two identifiers.  Location: Patient: Home Provider: Office   I discussed the limitations, risks, security and privacy concerns of performing an evaluation and management service by telephone and the availability of in person appointments. I also discussed with the patient that there [place] be a patient responsible charge related to this service. The patient expressed understanding and agreed to proceed.   History of Present Illness:  Pt reports headache, runny nose, nasal congestion, difficulty swallowing, cough, fever and chills. This started 1 week ago. The headache is located in his forehead. He describes the pain as pressure. He reports associated dizziness, but denies visual changes. He is not blowing colored mucous out of his nose. He is having difficulty swallowing. The cough is dry and nonproductive. He denies ear pain, loss of taste or smell, shortness of breath or wheezing. He has run a fever up to 101. He has had some chills and body aches. He has a history of allergies, COPD, asthma and DM 2. He was prescribed a zpack 1 week ago by pulmonology but reports he has not had any improvement. He has not had sick contacts or diagnosis with Covid that he is aware of. He has not gotten his Covid vaccine.  Past Medical History:  Diagnosis Date  . Allergic rhinitis   . Arthritis of left acromioclavicular joint 07/25/2014  . Asthma   . Chronic bronchitis ([place])   . Chronic kidney disease    H/O STONES  . Colon polyp   . Coronary artery disease   . Diabetes mellitus without complication ([place])   . Diverticula of colon   . GERD (gastroesophageal reflux disease)   . Hx of heart bypass surgery 2005  . Hypercholesteremia   . Impingement syndrome of left shoulder 07/25/2014  . Laceration of brachial artery 03/11/2016   left arm  .  Lumbar disc disease   . Partial nontraumatic rupture of right rotator cuff 10/20/2016    Current Outpatient Medications  Medication Sig Dispense Refill  . albuterol (PROAIR HFA) 108 (90 Base) MCG/ACT inhaler Inhale 2 puffs every 6 hours if needed for breathing 3 Inhaler 3  . aspirin EC 81 MG tablet Take 81 mg by mouth daily.    . cyclobenzaprine (FLEXERIL) 10 MG tablet Take 1 tablet (10 mg total) by mouth 3 (three) times daily as needed for muscle spasms. 30 tablet 0  . diphenhydrAMINE (BENADRYL) 25 mg capsule Take 1 capsule (25 mg total) by mouth every 8 (eight) hours as needed for itching.    Marland Kitchen EPIPEN 2-PAK 0.3 MG/0.3ML SOAJ injection INJECT ONE SYRINGEFUL INTO THE MUSCLE ONCE AS NEEDED FOR ALLERGIC REACTION 1 Device 6  . esomeprazole (NEXIUM) 40 MG capsule Take 1 capsule (40 mg total) by mouth daily at 12 noon. MUST SCHEDULE ANNUAL PHYSICAL 90 capsule 3  . ezetimibe (ZETIA) 10 MG tablet Take 1 tablet (10 mg total) by mouth daily. 90 tablet 3  . gabapentin (NEURONTIN) 300 MG capsule Take one qAM, one qPM, three qhs 450 capsule 3  . HYDROcodone-acetaminophen (NORCO) 5-325 MG tablet Take 1 tablet by mouth every 4 (four) hours as needed for moderate pain. 20 tablet 0  . ipratropium-albuterol (DUONEB) 0.5-2.5 (3) MG/3ML SOLN Take 3 mLs by nebulization every 6 (six) hours as needed. 360 mL 3  . loratadine (CLARITIN) 10 MG tablet  Take 1 tablet (10 mg total) by mouth daily. 90 tablet 3  . magnesium oxide (MAG-OX) 400 MG tablet Take 800 mg by mouth at bedtime.     . metFORMIN (GLUCOPHAGE) 1000 MG tablet TAKE 1 TABLET BY MOUTH TWICE DAILY WITH A MEAL MUST  SCHEDULE  PHYSICAL  EXAM  FOR  MORE  REFILLS 180 tablet 0  . metoprolol succinate (TOPROL-XL) 25 MG 24 hr tablet Take 1 tablet (25 mg total) by mouth at bedtime. 90 tablet 3  . oxyCODONE (OXY IR/ROXICODONE) 5 MG immediate release tablet Take 5 mg by mouth every 4 (four) hours.    . sennosides-docusate sodium (SENOKOT-S) 8.6-50 MG tablet Take 2 tablets  by mouth daily. 30 tablet 1  . simvastatin (ZOCOR) 40 MG tablet Take 1 tablet (40 mg total) by mouth at bedtime. 90 tablet 3  . traZODone (DESYREL) 100 MG tablet Take 2 tablets (200 mg total) by mouth at bedtime. 180 tablet 3  . fluticasone (FLONASE) 50 MCG/ACT nasal spray Place 2 sprays into both nostrils at bedtime. (Patient not taking: Reported on 11/05/2019) 48 g 3  . nitroGLYCERIN (NITROSTAT) 0.4 MG SL tablet Place 1 tablet (0.4 mg total) under the tongue every 5 (five) minutes as needed for chest pain. 25 tablet 3   No current facility-administered medications for this visit.    Allergies  Allergen Reactions  . 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 Niacin And Related Other (See Comments)    unknown    Family History  Problem Relation Age of Onset  . Asthma Father   . Emphysema Father   . Stroke Mother     Social History   Socioeconomic History  . Marital status: Married    Spouse name: Not on file  . Number of children: Not on file  . Years of education: Not on file  . Highest education level: Not on file  Occupational History  . Not on file  Tobacco Use  . Smoking status: Former Smoker    Packs/day: 0.00    Years: 10.00    Pack years: 0.00    Types: Cigars    Quit date: 08/09/2003    Years since quitting: 16.2  . Smokeless tobacco: Former Neurosurgeon  . Tobacco comment: 16/per day  Substance and Sexual Activity  . Alcohol use: No    Alcohol/week: 0.0 standard drinks  . Drug use: No  . Sexual activity: Yes  Other Topics Concern  . Not on file  Social History Narrative   Married.  One son.  No grandchildren.     Social Determinants of Health   Financial Resource Strain:   . Difficulty of Paying Living Expenses:   Food Insecurity:   . Worried About Programme researcher, broadcasting/film/video in the Last Year:   . Barista in the Last Year:    Transportation Needs:   . Freight forwarder (Medical):   Marland Kitchen Lack of Transportation (Non-Medical):   Physical Activity:   . Days of Exercise per Week:   . Minutes of Exercise per Session:   Stress:   . Feeling of Stress :   Social Connections:   . Frequency of Communication with Friends and Family:   . Frequency of Social Gatherings with Friends and Family:   . Attends Religious Services:   . Active Member  of Clubs or Organizations:   . Attends Banker Meetings:   Marland Kitchen Marital Status:   Intimate Partner Violence:   . Fear of Current or Ex-Partner:   . Emotionally Abused:   Marland Kitchen Physically Abused:   . Sexually Abused:      Constitutional: Pt reports headache, fatigue, malaise, fever and chills. Denies  abrupt weight changes.  HEENT: Pt reports runny nose, nasal congestion, and sore throat .Denies eye pain, eye redness, ear pain, ringing in the ears, wax buildup, bloody nose. Respiratory: Pt reports cough. Denies difficulty breathing, shortness of breath, or sputum production.   Cardiovascular: Denies chest pain, chest tightness, palpitations or swelling in the hands or feet.  Gastrointestinal: Denies nausea, vomiting or diarrhea.  Musculoskeletal: Pt reports body aches. Denies decrease in range of motion, difficulty with gait, or joint pain and swelling.  Skin: Denies redness, rashes, lesions or ulcercations.  Neurological: Pt reports dizziness. Denies difficulty with memory, difficulty with speech or problems with balance and coordination.    No other specific complaints in a complete review of systems (except as listed in HPI above).  Observations/Objective:  Temp 99.7 F (37.6 C) (Temporal)   Wt Readings from Last 3 Encounters:  05/08/19 199 lb (90.3 kg)  04/30/19 199 lb (90.3 kg)  01/04/19 205 lb (93 kg)    General: Appears his stated age, obese, ill appearing but in NAD. Pulmonary/Chest: Normal effort. No respiratory distress. Coughing  noted. Musculoskeletal: Laying on the cough. Neurological: Alert and oriented.    BMET    Component Value Date/Time   NA 135 12/19/2018 1421   NA 140 02/19/2013 1510   K 4.3 12/19/2018 1421   K 4.0 02/19/2013 1510   CL 102 12/19/2018 1421   CL 106 02/19/2013 1510   CO2 26 12/19/2018 1421   CO2 28 02/19/2013 1510   GLUCOSE 260 (H) 12/19/2018 1421   GLUCOSE 79 02/19/2013 1510   BUN 20 12/19/2018 1421   BUN 16 02/19/2013 1510   CREATININE 0.86 12/19/2018 1421   CREATININE 0.90 02/19/2013 1510   CALCIUM 8.5 12/19/2018 1421   CALCIUM 8.9 02/19/2013 1510   GFRNONAA >60 05/24/2016 0516   GFRNONAA >60 02/19/2013 1510   GFRAA >60 05/24/2016 0516   GFRAA >60 02/19/2013 1510    Lipid Panel     Component Value Date/Time   CHOL 144 12/19/2018 1421   TRIG 297.0 (H) 12/19/2018 1421   HDL 46.60 12/19/2018 1421   CHOLHDL 3 12/19/2018 1421   VLDL 59.4 (H) 12/19/2018 1421   LDLCALC 39 12/17/2013 1253    CBC    Component Value Date/Time   WBC 6.2 12/19/2018 1421   RBC 4.83 12/19/2018 1421   HGB 14.5 12/19/2018 1421   HGB 14.0 02/19/2013 1510   HCT 41.9 12/19/2018 1421   HCT 42.1 02/19/2013 1510   PLT 196.0 12/19/2018 1421   PLT 219 02/19/2013 1510   MCV 86.8 12/19/2018 1421   MCV 85 02/19/2013 1510   MCH 29.5 05/22/2016 0952   MCHC 34.6 12/19/2018 1421   RDW 13.3 12/19/2018 1421   RDW 13.3 02/19/2013 1510   LYMPHSABS 2.1 05/18/2016 1108   LYMPHSABS 2.4 02/19/2013 1510   MONOABS 0.5 05/18/2016 1108   MONOABS 0.5 02/19/2013 1510   EOSABS 0.3 05/18/2016 1108   EOSABS 0.4 02/19/2013 1510   BASOSABS 0.1 05/18/2016 1108   BASOSABS 0.1 02/19/2013 1510    Hgb A1C Lab Results  Component Value Date   HGBA1C 7.7 (H) 12/19/2018  Assessment and Plan:  Viral Respiratory Illness:  Concerning for Covid, recommend he gets tested. His wife knows how to sign him up for testing Recommend rest and fluids Recommend Tylenol and Ibuprofen OTC for headache, fever and body  aches Recommend Zyrtec and Flonase OTC No indication for additional abx or steroids at this time I think he [place] need a chest xray- he will reach out to pulm to see if he can have this done at their office, if not, he [place] need to go to urgent care. RX for Tussionex for cough  ER precautions discussed  Follow Up Instructions:    I discussed the assessment and treatment plan with the patient. The patient was provided an opportunity to ask questions and all were answered. The patient agreed with the plan and demonstrated an understanding of the instructions.   The patient was advised to call back or seek an in-person evaluation if the symptoms worsen or if the condition fails to improve as anticipated.    Nicki Reaper, NP

## 2019-11-05 NOTE — Patient Instructions (Signed)
COVID-19 COVID-19 is a respiratory infection that is caused by a virus called severe acute respiratory syndrome coronavirus 2 (SARS-CoV-2). The disease is also known as coronavirus disease or novel coronavirus. In some people, the virus Tolen not cause any symptoms. In others, it Torr cause a serious infection. The infection can get worse quickly and can lead to complications, such as:  Pneumonia, or infection of the lungs.  Acute respiratory distress syndrome or ARDS. This is a condition in which fluid build-up in the lungs prevents the lungs from filling with air and passing oxygen into the blood.  Acute respiratory failure. This is a condition in which there is not enough oxygen passing from the lungs to the body or when carbon dioxide is not passing from the lungs out of the body.  Sepsis or septic shock. This is a serious bodily reaction to an infection.  Blood clotting problems.  Secondary infections due to bacteria or fungus.  Organ failure. This is when your body's organs stop working. The virus that causes COVID-19 is contagious. This means that it can spread from person to person through droplets from coughs and sneezes (respiratory secretions). What are the causes? This illness is caused by a virus. You Luebke catch the virus by:  Breathing in droplets from an infected person. Droplets can be spread by a person breathing, speaking, singing, coughing, or sneezing.  Touching something, like a table or a doorknob, that was exposed to the virus (contaminated) and then touching your mouth, nose, or eyes. What increases the risk? Risk for infection You are more likely to be infected with this virus if you:  Are within 6 feet (2 meters) of a person with COVID-19.  Provide care for or live with a person who is infected with COVID-19.  Spend time in crowded indoor spaces or live in shared housing. Risk for serious illness You are more likely to become seriously ill from the virus if you:   Are 50 years of age or older. The higher your age, the more you are at risk for serious illness.  Live in a nursing home or long-term care facility.  Have cancer.  Have a long-term (chronic) disease such as: ? Chronic lung disease, including chronic obstructive pulmonary disease or asthma. ? A long-term disease that lowers your body's ability to fight infection (immunocompromised). ? Heart disease, including heart failure, a condition in which the arteries that lead to the heart become narrow or blocked (coronary artery disease), a disease which makes the heart muscle thick, weak, or stiff (cardiomyopathy). ? Diabetes. ? Chronic kidney disease. ? Sickle cell disease, a condition in which red blood cells have an abnormal "sickle" shape. ? Liver disease.  Are obese. What are the signs or symptoms? Symptoms of this condition can range from mild to severe. Symptoms Kliewer appear any time from 2 to 14 days after being exposed to the virus. They include:  A fever or chills.  A cough.  Difficulty breathing.  Headaches, body aches, or muscle aches.  Runny or stuffy (congested) nose.  A sore throat.  New loss of taste or smell. Some people Ault also have stomach problems, such as nausea, vomiting, or diarrhea. Other people Forlenza not have any symptoms of COVID-19. How is this diagnosed? This condition Fickle be diagnosed based on:  Your signs and symptoms, especially if: ? You live in an area with a COVID-19 outbreak. ? You recently traveled to or from an area where the virus is common. ? You   provide care for or live with a person who was diagnosed with COVID-19. ? You were exposed to a person who was diagnosed with COVID-19.  A physical exam.  Lab tests, which Spivack include: ? Taking a sample of fluid from the back of your nose and throat (nasopharyngeal fluid), your nose, or your throat using a swab. ? A sample of mucus from your lungs (sputum). ? Blood tests.  Imaging tests, which  Olaes include, X-rays, CT scan, or ultrasound. How is this treated? At present, there is no medicine to treat COVID-19. Medicines that treat other diseases are being used on a trial basis to see if they are effective against COVID-19. Your health care provider will talk with you about ways to treat your symptoms. For most people, the infection is mild and can be managed at home with rest, fluids, and over-the-counter medicines. Treatment for a serious infection usually takes places in a hospital intensive care unit (ICU). It Vanduyn include one or more of the following treatments. These treatments are given until your symptoms improve.  Receiving fluids and medicines through an IV.  Supplemental oxygen. Extra oxygen is given through a tube in the nose, a face mask, or a hood.  Positioning you to lie on your stomach (prone position). This makes it easier for oxygen to get into the lungs.  Continuous positive airway pressure (CPAP) or bi-level positive airway pressure (BPAP) machine. This treatment uses mild air pressure to keep the airways open. A tube that is connected to a motor delivers oxygen to the body.  Ventilator. This treatment moves air into and out of the lungs by using a tube that is placed in your windpipe.  Tracheostomy. This is a procedure to create a hole in the neck so that a breathing tube can be inserted.  Extracorporeal membrane oxygenation (ECMO). This procedure gives the lungs a chance to recover by taking over the functions of the heart and lungs. It supplies oxygen to the body and removes carbon dioxide. Follow these instructions at home: Lifestyle  If you are sick, stay home except to get medical care. Your health care provider will tell you how long to stay home. Call your health care provider before you go for medical care.  Rest at home as told by your health care provider.  Do not use any products that contain nicotine or tobacco, such as cigarettes, e-cigarettes, and  chewing tobacco. If you need help quitting, ask your health care provider.  Return to your normal activities as told by your health care provider. Ask your health care provider what activities are safe for you. General instructions  Take over-the-counter and prescription medicines only as told by your health care provider.  Drink enough fluid to keep your urine pale yellow.  Keep all follow-up visits as told by your health care provider. This is important. How is this prevented?  There is no vaccine to help prevent COVID-19 infection. However, there are steps you can take to protect yourself and others from this virus. To protect yourself:   Do not travel to areas where COVID-19 is a risk. The areas where COVID-19 is reported change often. To identify high-risk areas and travel restrictions, check the CDC travel website: wwwnc.cdc.gov/travel/notices  If you live in, or must travel to, an area where COVID-19 is a risk, take precautions to avoid infection. ? Stay away from people who are sick. ? Wash your hands often with soap and water for 20 seconds. If soap and water   are not available, use an alcohol-based hand sanitizer. ? Avoid touching your mouth, face, eyes, or nose. ? Avoid going out in public, follow guidance from your state and local health authorities. ? If you must go out in public, wear a cloth face covering or face mask. Make sure your mask covers your nose and mouth. ? Avoid crowded indoor spaces. Stay at least 6 feet (2 meters) away from others. ? Disinfect objects and surfaces that are frequently touched every day. This Bennie include:  Counters and tables.  Doorknobs and light switches.  Sinks and faucets.  Electronics, such as phones, remote controls, keyboards, computers, and tablets. To protect others: If you have symptoms of COVID-19, take steps to prevent the virus from spreading to others.  If you think you have a COVID-19 infection, contact your health care  provider right away. Tell your health care team that you think you Tsou have a COVID-19 infection.  Stay home. Leave your house only to seek medical care. Do not use public transport.  Do not travel while you are sick.  Wash your hands often with soap and water for 20 seconds. If soap and water are not available, use alcohol-based hand sanitizer.  Stay away from other members of your household. Let healthy household members care for children and pets, if possible. If you have to care for children or pets, wash your hands often and wear a mask. If possible, stay in your own room, separate from others. Use a different bathroom.  Make sure that all people in your household wash their hands well and often.  Cough or sneeze into a tissue or your sleeve or elbow. Do not cough or sneeze into your hand or into the air.  Wear a cloth face covering or face mask. Make sure your mask covers your nose and mouth. Where to find more information  Centers for Disease Control and Prevention: www.cdc.gov/coronavirus/2019-ncov/index.html  World Health Organization: www.who.int/health-topics/coronavirus Contact a health care provider if:  You live in or have traveled to an area where COVID-19 is a risk and you have symptoms of the infection.  You have had contact with someone who has COVID-19 and you have symptoms of the infection. Get help right away if:  You have trouble breathing.  You have pain or pressure in your chest.  You have confusion.  You have bluish lips and fingernails.  You have difficulty waking from sleep.  You have symptoms that get worse. These symptoms Vanvranken represent a serious problem that is an emergency. Do not wait to see if the symptoms will go away. Get medical help right away. Call your local emergency services (911 in the [place] Do not drive yourself to the hospital. Let the emergency medical personnel know if you think you have COVID-19. Summary  COVID-19 is a  respiratory infection that is caused by a virus. It is also known as coronavirus disease or novel coronavirus. It can cause serious infections, such as pneumonia, acute respiratory distress syndrome, acute respiratory failure, or sepsis.  The virus that causes COVID-19 is contagious. This means that it can spread from person to person through droplets from breathing, speaking, singing, coughing, or sneezing.  You are more likely to develop a serious illness if you are 50 years of age or older, have a weak immune system, live in a nursing home, or have chronic disease.  There is no medicine to treat COVID-19. Your health care provider will talk with you about ways to treat your symptoms.    Take steps to protect yourself and others from infection. Wash your hands often and disinfect objects and surfaces that are frequently touched every day. Stay away from people who are sick and wear a mask if you are sick. This information is not intended to replace advice given to you by your health care provider. Make sure you discuss any questions you have with your health care provider. Document Revised: 05/24/2019 Document Reviewed: 08/30/2018 [employer] Patient Education  2020 [employer]  

## 2019-11-07 ENCOUNTER — Telehealth: Payer: Self-pay | Admitting: Internal Medicine

## 2019-11-07 NOTE — Telephone Encounter (Signed)
Pt's wife is aware as instructed and she did test positive for Covid as well

## 2019-11-07 NOTE — Telephone Encounter (Signed)
Continue symptomatic care, rest, fluids, Ibuprofen, tylenol etc. If develops severe weakness, difficulty breathing, chest pain, would recommend UC/ER eval.

## 2019-11-07 NOTE — Telephone Encounter (Signed)
Patient's wife called today She stated that the patient did go take the covid test like advised and the results came back positive today.   Wife would like to know what his next step should be  Please advise

## 2019-11-08 ENCOUNTER — Inpatient Hospital Stay (HOSPITAL_COMMUNITY)
Admission: EM | Admit: 2019-11-08 | Discharge: 2020-02-06 | DRG: 004 | Disposition: E | Payer: Medicare Other | Attending: Internal Medicine | Admitting: Internal Medicine

## 2019-11-08 ENCOUNTER — Other Ambulatory Visit: Payer: Self-pay

## 2019-11-08 ENCOUNTER — Emergency Department (HOSPITAL_COMMUNITY): Payer: Medicare Other

## 2019-11-08 ENCOUNTER — Encounter (HOSPITAL_COMMUNITY): Payer: Self-pay | Admitting: Emergency Medicine

## 2019-11-08 DIAGNOSIS — G9340 Encephalopathy, unspecified: Secondary | ICD-10-CM | POA: Diagnosis not present

## 2019-11-08 DIAGNOSIS — E875 Hyperkalemia: Secondary | ICD-10-CM | POA: Diagnosis not present

## 2019-11-08 DIAGNOSIS — J690 Pneumonitis due to inhalation of food and vomit: Secondary | ICD-10-CM | POA: Diagnosis not present

## 2019-11-08 DIAGNOSIS — E871 Hypo-osmolality and hyponatremia: Secondary | ICD-10-CM | POA: Diagnosis not present

## 2019-11-08 DIAGNOSIS — F419 Anxiety disorder, unspecified: Secondary | ICD-10-CM | POA: Diagnosis not present

## 2019-11-08 DIAGNOSIS — J982 Interstitial emphysema: Secondary | ICD-10-CM | POA: Diagnosis not present

## 2019-11-08 DIAGNOSIS — N179 Acute kidney failure, unspecified: Secondary | ICD-10-CM | POA: Diagnosis present

## 2019-11-08 DIAGNOSIS — Z4659 Encounter for fitting and adjustment of other gastrointestinal appliance and device: Secondary | ICD-10-CM

## 2019-11-08 DIAGNOSIS — J9690 Respiratory failure, unspecified, unspecified whether with hypoxia or hypercapnia: Secondary | ICD-10-CM | POA: Diagnosis not present

## 2019-11-08 DIAGNOSIS — R0989 Other specified symptoms and signs involving the circulatory and respiratory systems: Secondary | ICD-10-CM

## 2019-11-08 DIAGNOSIS — R0902 Hypoxemia: Secondary | ICD-10-CM | POA: Diagnosis not present

## 2019-11-08 DIAGNOSIS — D649 Anemia, unspecified: Secondary | ICD-10-CM | POA: Diagnosis not present

## 2019-11-08 DIAGNOSIS — J4521 Mild intermittent asthma with (acute) exacerbation: Secondary | ICD-10-CM | POA: Diagnosis present

## 2019-11-08 DIAGNOSIS — Z880 Allergy status to penicillin: Secondary | ICD-10-CM

## 2019-11-08 DIAGNOSIS — R410 Disorientation, unspecified: Secondary | ICD-10-CM | POA: Diagnosis not present

## 2019-11-08 DIAGNOSIS — Z930 Tracheostomy status: Secondary | ICD-10-CM | POA: Diagnosis not present

## 2019-11-08 DIAGNOSIS — E43 Unspecified severe protein-calorie malnutrition: Secondary | ICD-10-CM | POA: Diagnosis not present

## 2019-11-08 DIAGNOSIS — J9601 Acute respiratory failure with hypoxia: Secondary | ICD-10-CM

## 2019-11-08 DIAGNOSIS — Z515 Encounter for palliative care: Secondary | ICD-10-CM | POA: Diagnosis not present

## 2019-11-08 DIAGNOSIS — L89154 Pressure ulcer of sacral region, stage 4: Secondary | ICD-10-CM | POA: Diagnosis not present

## 2019-11-08 DIAGNOSIS — Z781 Physical restraint status: Secondary | ICD-10-CM

## 2019-11-08 DIAGNOSIS — N200 Calculus of kidney: Secondary | ICD-10-CM | POA: Diagnosis not present

## 2019-11-08 DIAGNOSIS — E11649 Type 2 diabetes mellitus with hypoglycemia without coma: Secondary | ICD-10-CM | POA: Diagnosis not present

## 2019-11-08 DIAGNOSIS — Z9119 Patient's noncompliance with other medical treatment and regimen: Secondary | ICD-10-CM

## 2019-11-08 DIAGNOSIS — J9811 Atelectasis: Secondary | ICD-10-CM | POA: Diagnosis not present

## 2019-11-08 DIAGNOSIS — Z79899 Other long term (current) drug therapy: Secondary | ICD-10-CM

## 2019-11-08 DIAGNOSIS — J95851 Ventilator associated pneumonia: Secondary | ICD-10-CM

## 2019-11-08 DIAGNOSIS — I319 Disease of pericardium, unspecified: Secondary | ICD-10-CM | POA: Diagnosis not present

## 2019-11-08 DIAGNOSIS — F4310 Post-traumatic stress disorder, unspecified: Secondary | ICD-10-CM | POA: Diagnosis not present

## 2019-11-08 DIAGNOSIS — Z87891 Personal history of nicotine dependence: Secondary | ICD-10-CM

## 2019-11-08 DIAGNOSIS — Z66 Do not resuscitate: Secondary | ICD-10-CM | POA: Diagnosis not present

## 2019-11-08 DIAGNOSIS — J8410 Pulmonary fibrosis, unspecified: Secondary | ICD-10-CM | POA: Diagnosis not present

## 2019-11-08 DIAGNOSIS — Z7984 Long term (current) use of oral hypoglycemic drugs: Secondary | ICD-10-CM

## 2019-11-08 DIAGNOSIS — B965 Pseudomonas (aeruginosa) (mallei) (pseudomallei) as the cause of diseases classified elsewhere: Secondary | ICD-10-CM | POA: Diagnosis not present

## 2019-11-08 DIAGNOSIS — E8770 Fluid overload, unspecified: Secondary | ICD-10-CM | POA: Diagnosis not present

## 2019-11-08 DIAGNOSIS — J80 Acute respiratory distress syndrome: Secondary | ICD-10-CM

## 2019-11-08 DIAGNOSIS — J4520 Mild intermittent asthma, uncomplicated: Secondary | ICD-10-CM | POA: Diagnosis present

## 2019-11-08 DIAGNOSIS — G9341 Metabolic encephalopathy: Secondary | ICD-10-CM | POA: Diagnosis not present

## 2019-11-08 DIAGNOSIS — I2510 Atherosclerotic heart disease of native coronary artery without angina pectoris: Secondary | ICD-10-CM | POA: Diagnosis present

## 2019-11-08 DIAGNOSIS — J189 Pneumonia, unspecified organism: Secondary | ICD-10-CM

## 2019-11-08 DIAGNOSIS — E785 Hyperlipidemia, unspecified: Secondary | ICD-10-CM | POA: Diagnosis not present

## 2019-11-08 DIAGNOSIS — E870 Hyperosmolality and hypernatremia: Secondary | ICD-10-CM | POA: Diagnosis not present

## 2019-11-08 DIAGNOSIS — E119 Type 2 diabetes mellitus without complications: Secondary | ICD-10-CM

## 2019-11-08 DIAGNOSIS — G92 Toxic encephalopathy: Secondary | ICD-10-CM | POA: Diagnosis present

## 2019-11-08 DIAGNOSIS — G7281 Critical illness myopathy: Secondary | ICD-10-CM | POA: Diagnosis not present

## 2019-11-08 DIAGNOSIS — E872 Acidosis: Secondary | ICD-10-CM | POA: Diagnosis present

## 2019-11-08 DIAGNOSIS — J9600 Acute respiratory failure, unspecified whether with hypoxia or hypercapnia: Secondary | ICD-10-CM

## 2019-11-08 DIAGNOSIS — T380X5A Adverse effect of glucocorticoids and synthetic analogues, initial encounter: Secondary | ICD-10-CM | POA: Diagnosis present

## 2019-11-08 DIAGNOSIS — U071 COVID-19: Principal | ICD-10-CM | POA: Diagnosis present

## 2019-11-08 DIAGNOSIS — R918 Other nonspecific abnormal finding of lung field: Secondary | ICD-10-CM | POA: Diagnosis not present

## 2019-11-08 DIAGNOSIS — E7800 Pure hypercholesterolemia, unspecified: Secondary | ICD-10-CM | POA: Diagnosis present

## 2019-11-08 DIAGNOSIS — N139 Obstructive and reflux uropathy, unspecified: Secondary | ICD-10-CM | POA: Diagnosis not present

## 2019-11-08 DIAGNOSIS — D696 Thrombocytopenia, unspecified: Secondary | ICD-10-CM | POA: Diagnosis not present

## 2019-11-08 DIAGNOSIS — A419 Sepsis, unspecified organism: Secondary | ICD-10-CM | POA: Diagnosis not present

## 2019-11-08 DIAGNOSIS — R509 Fever, unspecified: Secondary | ICD-10-CM

## 2019-11-08 DIAGNOSIS — R1311 Dysphagia, oral phase: Secondary | ICD-10-CM | POA: Diagnosis not present

## 2019-11-08 DIAGNOSIS — X58XXXA Exposure to other specified factors, initial encounter: Secondary | ICD-10-CM | POA: Diagnosis not present

## 2019-11-08 DIAGNOSIS — R0602 Shortness of breath: Secondary | ICD-10-CM

## 2019-11-08 DIAGNOSIS — L89150 Pressure ulcer of sacral region, unstageable: Secondary | ICD-10-CM | POA: Diagnosis not present

## 2019-11-08 DIAGNOSIS — J1282 Pneumonia due to coronavirus disease 2019: Secondary | ICD-10-CM | POA: Diagnosis present

## 2019-11-08 DIAGNOSIS — Z7982 Long term (current) use of aspirin: Secondary | ICD-10-CM

## 2019-11-08 DIAGNOSIS — K219 Gastro-esophageal reflux disease without esophagitis: Secondary | ICD-10-CM | POA: Diagnosis present

## 2019-11-08 DIAGNOSIS — R32 Unspecified urinary incontinence: Secondary | ICD-10-CM | POA: Diagnosis not present

## 2019-11-08 DIAGNOSIS — E1165 Type 2 diabetes mellitus with hyperglycemia: Secondary | ICD-10-CM | POA: Diagnosis present

## 2019-11-08 DIAGNOSIS — J440 Chronic obstructive pulmonary disease with acute lower respiratory infection: Secondary | ICD-10-CM | POA: Diagnosis present

## 2019-11-08 DIAGNOSIS — Z431 Encounter for attention to gastrostomy: Secondary | ICD-10-CM

## 2019-11-08 DIAGNOSIS — E669 Obesity, unspecified: Secondary | ICD-10-CM | POA: Diagnosis present

## 2019-11-08 DIAGNOSIS — I252 Old myocardial infarction: Secondary | ICD-10-CM

## 2019-11-08 DIAGNOSIS — D6959 Other secondary thrombocytopenia: Secondary | ICD-10-CM | POA: Diagnosis not present

## 2019-11-08 DIAGNOSIS — I10 Essential (primary) hypertension: Secondary | ICD-10-CM | POA: Diagnosis present

## 2019-11-08 DIAGNOSIS — R339 Retention of urine, unspecified: Secondary | ICD-10-CM | POA: Diagnosis not present

## 2019-11-08 DIAGNOSIS — R7989 Other specified abnormal findings of blood chemistry: Secondary | ICD-10-CM | POA: Diagnosis not present

## 2019-11-08 DIAGNOSIS — Z01818 Encounter for other preprocedural examination: Secondary | ICD-10-CM

## 2019-11-08 DIAGNOSIS — D72829 Elevated white blood cell count, unspecified: Secondary | ICD-10-CM | POA: Diagnosis not present

## 2019-11-08 DIAGNOSIS — R0689 Other abnormalities of breathing: Secondary | ICD-10-CM | POA: Diagnosis not present

## 2019-11-08 DIAGNOSIS — Z955 Presence of coronary angioplasty implant and graft: Secondary | ICD-10-CM

## 2019-11-08 DIAGNOSIS — Z951 Presence of aortocoronary bypass graft: Secondary | ICD-10-CM

## 2019-11-08 DIAGNOSIS — Z4682 Encounter for fitting and adjustment of non-vascular catheter: Secondary | ICD-10-CM | POA: Diagnosis not present

## 2019-11-08 DIAGNOSIS — B342 Coronavirus infection, unspecified: Secondary | ICD-10-CM | POA: Diagnosis not present

## 2019-11-08 DIAGNOSIS — J90 Pleural effusion, not elsewhere classified: Secondary | ICD-10-CM | POA: Diagnosis not present

## 2019-11-08 DIAGNOSIS — G9389 Other specified disorders of brain: Secondary | ICD-10-CM | POA: Diagnosis not present

## 2019-11-08 DIAGNOSIS — J939 Pneumothorax, unspecified: Secondary | ICD-10-CM

## 2019-11-08 DIAGNOSIS — I428 Other cardiomyopathies: Secondary | ICD-10-CM | POA: Diagnosis not present

## 2019-11-08 DIAGNOSIS — E876 Hypokalemia: Secondary | ICD-10-CM | POA: Diagnosis not present

## 2019-11-08 DIAGNOSIS — R0603 Acute respiratory distress: Secondary | ICD-10-CM

## 2019-11-08 DIAGNOSIS — R633 Feeding difficulties: Secondary | ICD-10-CM | POA: Diagnosis not present

## 2019-11-08 DIAGNOSIS — T17990A Other foreign object in respiratory tract, part unspecified in causing asphyxiation, initial encounter: Secondary | ICD-10-CM | POA: Diagnosis not present

## 2019-11-08 DIAGNOSIS — Z978 Presence of other specified devices: Secondary | ICD-10-CM

## 2019-11-08 LAB — D-DIMER, QUANTITATIVE
D-Dimer, Quant: 1.02 ug/mL-FEU — ABNORMAL HIGH (ref 0.00–0.50)
D-Dimer, Quant: 0.94 ug/mL-FEU — ABNORMAL HIGH (ref 0.00–0.50)

## 2019-11-08 LAB — C-REACTIVE PROTEIN: CRP: 33.8 mg/dL — ABNORMAL HIGH (ref <1.0)

## 2019-11-08 LAB — COMPREHENSIVE METABOLIC PANEL
ALT: 21 U/L (ref 0–44)
AST: 43 U/L — ABNORMAL HIGH (ref 15–41)
Albumin: 2.9 g/dL — ABNORMAL LOW (ref 3.5–5.0)
Alkaline Phosphatase: 72 U/L (ref 38–126)
Anion gap: 21 — ABNORMAL HIGH (ref 5–15)
BUN: 40 mg/dL — ABNORMAL HIGH (ref 8–23)
CO2: 21 mmol/L — ABNORMAL LOW (ref 22–32)
Calcium: 9.1 mg/dL (ref 8.9–10.3)
Chloride: 90 mmol/L — ABNORMAL LOW (ref 98–111)
Creatinine, Ser: 1.51 mg/dL — ABNORMAL HIGH (ref 0.61–1.24)
GFR calc Af Amer: 56 mL/min — ABNORMAL LOW (ref >60)
GFR calc non Af Amer: 48 mL/min — ABNORMAL LOW (ref >60)
Glucose, Bld: 344 mg/dL — ABNORMAL HIGH (ref 70–99)
Potassium: 5.4 mmol/L — ABNORMAL HIGH (ref 3.5–5.1)
Sodium: 132 mmol/L — ABNORMAL LOW (ref 135–145)
Total Bilirubin: 1 mg/dL (ref 0.3–1.2)
Total Protein: 8 g/dL (ref 6.5–8.1)

## 2019-11-08 LAB — CBC WITH DIFFERENTIAL/PLATELET
Abs Immature Granulocytes: 0.1 10*3/uL — ABNORMAL HIGH (ref 0.00–0.07)
Basophils Absolute: 0.1 10*3/uL (ref 0.0–0.1)
Basophils Relative: 0 %
Eosinophils Absolute: 0 10*3/uL (ref 0.0–0.5)
Eosinophils Relative: 0 %
HCT: 51.9 % (ref 39.0–52.0)
Hemoglobin: 17.4 g/dL — ABNORMAL HIGH (ref 13.0–17.0)
Immature Granulocytes: 1 %
Lymphocytes Relative: 5 %
Lymphs Abs: 0.8 10*3/uL (ref 0.7–4.0)
MCH: 29.4 pg (ref 26.0–34.0)
MCHC: 33.5 g/dL (ref 30.0–36.0)
MCV: 87.7 fL (ref 80.0–100.0)
Monocytes Absolute: 0.8 10*3/uL (ref 0.1–1.0)
Monocytes Relative: 5 %
Neutro Abs: 13.7 10*3/uL — ABNORMAL HIGH (ref 1.7–7.7)
Neutrophils Relative %: 89 %
Platelets: 317 10*3/uL (ref 150–400)
RBC: 5.92 MIL/uL — ABNORMAL HIGH (ref 4.22–5.81)
RDW: 12.3 % (ref 11.5–15.5)
WBC: 15.5 10*3/uL — ABNORMAL HIGH (ref 4.0–10.5)
nRBC: 0 % (ref 0.0–0.2)

## 2019-11-08 LAB — FERRITIN: Ferritin: 1172 ng/mL — ABNORMAL HIGH (ref 24–336)

## 2019-11-08 LAB — TRIGLYCERIDES: Triglycerides: 182 mg/dL — ABNORMAL HIGH (ref <150)

## 2019-11-08 LAB — LACTIC ACID, PLASMA
Lactic Acid, Venous: 6.7 mmol/L (ref 0.5–1.9)
Lactic Acid, Venous: 4.4 mmol/L (ref 0.5–1.9)

## 2019-11-08 LAB — HEMOGLOBIN A1C
Hgb A1c MFr Bld: 7.5 % — ABNORMAL HIGH (ref 4.8–5.6)
Mean Plasma Glucose: 168.55 mg/dL

## 2019-11-08 LAB — SARS CORONAVIRUS 2 (TAT 6-24 HRS): SARS Coronavirus 2: POSITIVE — AB

## 2019-11-08 LAB — POC SARS CORONAVIRUS 2 AG -  ED: SARS Coronavirus 2 Ag: POSITIVE — AB

## 2019-11-08 LAB — FIBRINOGEN: Fibrinogen: >800 mg/dL — ABNORMAL HIGH (ref 210–475)

## 2019-11-08 LAB — PROCALCITONIN: Procalcitonin: 1.44 ng/mL

## 2019-11-08 LAB — CBG MONITORING, ED: Glucose-Capillary: 456 mg/dL — ABNORMAL HIGH (ref 70–99)

## 2019-11-08 LAB — LACTATE DEHYDROGENASE: LDH: 480 U/L — ABNORMAL HIGH (ref 98–192)

## 2019-11-08 MED ORDER — TRAZODONE HCL 150 MG PO TABS
150.0000 mg | ORAL_TABLET | Freq: Every day | ORAL | Status: DC
  Administered 2019-11-08 – 2019-11-09 (×2): 150 mg via ORAL
  Filled 2019-11-08: qty 1
  Filled 2019-11-08: qty 3
  Filled 2019-11-08: qty 1
  Filled 2019-11-08 (×2): qty 3

## 2019-11-08 MED ORDER — SODIUM CHLORIDE 0.9 % IV SOLN
200.0000 mg | Freq: Once | INTRAVENOUS | Status: AC
  Administered 2019-11-08: 200 mg via INTRAVENOUS
  Filled 2019-11-08: qty 40

## 2019-11-08 MED ORDER — DEXAMETHASONE 6 MG PO TABS
6.0000 mg | ORAL_TABLET | ORAL | Status: DC
  Administered 2019-11-08: 6 mg via ORAL
  Filled 2019-11-08: qty 2

## 2019-11-08 MED ORDER — SIMVASTATIN 20 MG PO TABS
40.0000 mg | ORAL_TABLET | Freq: Every day | ORAL | Status: DC
  Administered 2019-11-08 – 2019-11-09 (×2): 40 mg via ORAL
  Filled 2019-11-08 (×5): qty 2

## 2019-11-08 MED ORDER — ASPIRIN EC 81 MG PO TBEC
81.0000 mg | DELAYED_RELEASE_TABLET | Freq: Every day | ORAL | Status: DC
  Administered 2019-11-08 – 2019-11-11 (×3): 81 mg via ORAL
  Filled 2019-11-08 (×4): qty 1

## 2019-11-08 MED ORDER — ENOXAPARIN SODIUM 40 MG/0.4ML ~~LOC~~ SOLN
40.0000 mg | SUBCUTANEOUS | Status: DC
  Administered 2019-11-08: 40 mg via SUBCUTANEOUS
  Filled 2019-11-08: qty 0.4

## 2019-11-08 MED ORDER — METOPROLOL SUCCINATE ER 25 MG PO TB24
25.0000 mg | ORAL_TABLET | Freq: Every day | ORAL | Status: DC
  Administered 2019-11-08 – 2019-11-09 (×2): 25 mg via ORAL
  Filled 2019-11-08 (×3): qty 1

## 2019-11-08 MED ORDER — LORATADINE 10 MG PO TABS
10.0000 mg | ORAL_TABLET | Freq: Every day | ORAL | Status: DC
  Administered 2019-11-08 – 2019-11-09 (×2): 10 mg via ORAL
  Filled 2019-11-08 (×3): qty 1

## 2019-11-08 MED ORDER — INSULIN ASPART 100 UNIT/ML ~~LOC~~ SOLN
0.0000 [IU] | Freq: Three times a day (TID) | SUBCUTANEOUS | Status: DC
  Administered 2019-11-09: 20 [IU] via SUBCUTANEOUS
  Administered 2019-11-09: 7 [IU] via SUBCUTANEOUS
  Administered 2019-11-09: 15 [IU] via SUBCUTANEOUS
  Administered 2019-11-09: 7 [IU] via SUBCUTANEOUS
  Administered 2019-11-10: 3 [IU] via SUBCUTANEOUS
  Administered 2019-11-10: 15 [IU] via SUBCUTANEOUS
  Administered 2019-11-10: 11 [IU] via SUBCUTANEOUS
  Administered 2019-11-11 (×3): 7 [IU] via SUBCUTANEOUS

## 2019-11-08 MED ORDER — SODIUM CHLORIDE 0.45 % IV SOLN: INTRAVENOUS | Status: AC

## 2019-11-08 MED ORDER — GABAPENTIN 300 MG PO CAPS
900.0000 mg | ORAL_CAPSULE | Freq: Every day | ORAL | Status: DC
  Administered 2019-11-08 – 2019-11-09 (×2): 900 mg via ORAL
  Filled 2019-11-08 (×2): qty 3
  Filled 2019-11-08: qty 1

## 2019-11-08 MED ORDER — ACETAMINOPHEN 325 MG PO TABS
650.0000 mg | ORAL_TABLET | Freq: Four times a day (QID) | ORAL | Status: DC | PRN
  Filled 2019-11-08: qty 2

## 2019-11-08 MED ORDER — INSULIN ASPART 100 UNIT/ML ~~LOC~~ SOLN: 0.0000 [IU] | Freq: Three times a day (TID) | SUBCUTANEOUS | Status: DC

## 2019-11-08 MED ORDER — TOCILIZUMAB 400 MG/20ML IV SOLN
700.0000 mg | Freq: Once | INTRAVENOUS | Status: AC
  Administered 2019-11-08: 700 mg via INTRAVENOUS
  Filled 2019-11-08: qty 35

## 2019-11-08 MED ORDER — ALBUTEROL SULFATE HFA 108 (90 BASE) MCG/ACT IN AERS
2.0000 | INHALATION_SPRAY | Freq: Four times a day (QID) | RESPIRATORY_TRACT | Status: DC | PRN
  Administered 2019-11-09: 09:00:00 2 via RESPIRATORY_TRACT
  Filled 2019-11-08: qty 6.7

## 2019-11-08 MED ORDER — MAGNESIUM OXIDE 400 (241.3 MG) MG PO TABS
800.0000 mg | ORAL_TABLET | Freq: Every day | ORAL | Status: DC
  Administered 2019-11-08 – 2019-11-09 (×2): 800 mg via ORAL
  Filled 2019-11-08 (×5): qty 2

## 2019-11-08 MED ORDER — NITROGLYCERIN 0.4 MG SL SUBL: 0.4000 mg | SUBLINGUAL_TABLET | SUBLINGUAL | Status: DC | PRN

## 2019-11-08 MED ORDER — SODIUM CHLORIDE 0.9 % IV SOLN
100.0000 mg | Freq: Every day | INTRAVENOUS | Status: AC
  Administered 2019-11-09 – 2019-11-12 (×4): 100 mg via INTRAVENOUS
  Filled 2019-11-08 (×4): qty 20

## 2019-11-08 MED ORDER — SODIUM CHLORIDE 0.9 % IV SOLN
2.0000 g | INTRAVENOUS | Status: DC
  Administered 2019-11-08: 2 g via INTRAVENOUS
  Filled 2019-11-08: qty 20

## 2019-11-08 MED ORDER — FLUTICASONE PROPIONATE 50 MCG/ACT NA SUSP
2.0000 | Freq: Every day | NASAL | Status: DC
  Administered 2019-11-09 – 2020-01-06 (×37): 2 via NASAL
  Filled 2019-11-08 (×4): qty 16

## 2019-11-08 MED ORDER — PANTOPRAZOLE SODIUM 40 MG PO TBEC
40.0000 mg | DELAYED_RELEASE_TABLET | Freq: Every day | ORAL | Status: DC
  Administered 2019-11-08 – 2019-11-09 (×2): 40 mg via ORAL
  Filled 2019-11-08 (×3): qty 1

## 2019-11-08 MED ORDER — EZETIMIBE 10 MG PO TABS
10.0000 mg | ORAL_TABLET | Freq: Every day | ORAL | Status: DC
  Administered 2019-11-08 – 2019-11-09 (×2): 10 mg via ORAL
  Filled 2019-11-08 (×5): qty 1

## 2019-11-08 MED ORDER — INSULIN GLARGINE 100 UNIT/ML ~~LOC~~ SOLN
20.0000 [IU] | Freq: Every day | SUBCUTANEOUS | Status: DC
  Administered 2019-11-08: 20 [IU] via SUBCUTANEOUS
  Filled 2019-11-08 (×2): qty 0.2

## 2019-11-08 NOTE — ED Triage Notes (Signed)
Pt. Presents from home via ems, awake but lethargic, in respiratory distress since today morning as per ems. Recently Dx covid-19, Hx COPD

## 2019-11-08 NOTE — Progress Notes (Signed)
CRITICAL VALUE ALERT  Critical Value: Glucose 456  Date & Time Notied:  12/05/2019 2015  Provider Notified: Dr./ Ranell Patrick  Orders Received/Actions taken: Stated he will order night time insulin to be started

## 2019-11-08 NOTE — Progress Notes (Signed)
Placed patient on side to improve oxygenation. Patient refused Proning. Dr. Ranell Patrick made aware of increased oxygen demands. Patient currently on high flow at 15L. Patient is currently resting in bed with no pain noted. Will continue to monitor.

## 2019-11-08 NOTE — Plan of Care (Signed)

## 2019-11-08 NOTE — ED Notes (Signed)
Patient placed in prone position. SpO2 increasing.
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
## 2019-11-09 ENCOUNTER — Inpatient Hospital Stay (HOSPITAL_COMMUNITY): Payer: Medicare Other

## 2019-11-09 DIAGNOSIS — U071 COVID-19: Secondary | ICD-10-CM | POA: Diagnosis not present

## 2019-11-09 DIAGNOSIS — E119 Type 2 diabetes mellitus without complications: Secondary | ICD-10-CM | POA: Diagnosis not present

## 2019-11-09 DIAGNOSIS — E7800 Pure hypercholesterolemia, unspecified: Secondary | ICD-10-CM

## 2019-11-09 DIAGNOSIS — J9601 Acute respiratory failure with hypoxia: Secondary | ICD-10-CM | POA: Diagnosis not present

## 2019-11-09 DIAGNOSIS — R7989 Other specified abnormal findings of blood chemistry: Secondary | ICD-10-CM

## 2019-11-09 LAB — COMPREHENSIVE METABOLIC PANEL
ALT: 23 U/L (ref 0–44)
AST: 43 U/L — ABNORMAL HIGH (ref 15–41)
Albumin: 2.3 g/dL — ABNORMAL LOW (ref 3.5–5.0)
Alkaline Phosphatase: 63 U/L (ref 38–126)
Anion gap: 14 (ref 5–15)
BUN: 34 mg/dL — ABNORMAL HIGH (ref 8–23)
CO2: 25 mmol/L (ref 22–32)
Calcium: 8.6 mg/dL — ABNORMAL LOW (ref 8.9–10.3)
Chloride: 93 mmol/L — ABNORMAL LOW (ref 98–111)
Creatinine, Ser: 1.07 mg/dL (ref 0.61–1.24)
GFR calc Af Amer: >60 mL/min (ref >60)
GFR calc non Af Amer: >60 mL/min (ref >60)
Glucose, Bld: 357 mg/dL — ABNORMAL HIGH (ref 70–99)
Potassium: 4.2 mmol/L (ref 3.5–5.1)
Sodium: 132 mmol/L — ABNORMAL LOW (ref 135–145)
Total Bilirubin: 0.6 mg/dL (ref 0.3–1.2)
Total Protein: 7 g/dL (ref 6.5–8.1)

## 2019-11-09 LAB — CBC WITH DIFFERENTIAL/PLATELET
Abs Immature Granulocytes: 0.04 10*3/uL (ref 0.00–0.07)
Basophils Absolute: 0 10*3/uL (ref 0.0–0.1)
Basophils Relative: 0 %
Eosinophils Absolute: 0 10*3/uL (ref 0.0–0.5)
Eosinophils Relative: 0 %
HCT: 45.3 % (ref 39.0–52.0)
Hemoglobin: 15.5 g/dL (ref 13.0–17.0)
Immature Granulocytes: 1 %
Lymphocytes Relative: 8 %
Lymphs Abs: 0.6 10*3/uL — ABNORMAL LOW (ref 0.7–4.0)
MCH: 28.8 pg (ref 26.0–34.0)
MCHC: 34.2 g/dL (ref 30.0–36.0)
MCV: 84.2 fL (ref 80.0–100.0)
Monocytes Absolute: 0.2 10*3/uL (ref 0.1–1.0)
Monocytes Relative: 3 %
Neutro Abs: 7.2 10*3/uL (ref 1.7–7.7)
Neutrophils Relative %: 88 %
Platelets: 266 10*3/uL (ref 150–400)
RBC: 5.38 MIL/uL (ref 4.22–5.81)
RDW: 12.1 % (ref 11.5–15.5)
WBC: 8.1 10*3/uL (ref 4.0–10.5)
nRBC: 0 % (ref 0.0–0.2)

## 2019-11-09 LAB — PROCALCITONIN: Procalcitonin: 3.26 ng/mL

## 2019-11-09 LAB — HIV ANTIBODY (ROUTINE TESTING W REFLEX): HIV Screen 4th Generation wRfx: NONREACTIVE

## 2019-11-09 LAB — BRAIN NATRIURETIC PEPTIDE: B Natriuretic Peptide: 55.9 pg/mL (ref 0.0–100.0)

## 2019-11-09 LAB — GLUCOSE, CAPILLARY
Glucose-Capillary: 215 mg/dL — ABNORMAL HIGH (ref 70–99)
Glucose-Capillary: 230 mg/dL — ABNORMAL HIGH (ref 70–99)
Glucose-Capillary: 249 mg/dL — ABNORMAL HIGH (ref 70–99)
Glucose-Capillary: 324 mg/dL — ABNORMAL HIGH (ref 70–99)
Glucose-Capillary: 327 mg/dL — ABNORMAL HIGH (ref 70–99)
Glucose-Capillary: 401 mg/dL — ABNORMAL HIGH (ref 70–99)

## 2019-11-09 LAB — C-REACTIVE PROTEIN: CRP: 35 mg/dL — ABNORMAL HIGH (ref <1.0)

## 2019-11-09 LAB — D-DIMER, QUANTITATIVE: D-Dimer, Quant: 1.67 ug/mL-FEU — ABNORMAL HIGH (ref 0.00–0.50)

## 2019-11-09 MED ORDER — SODIUM CHLORIDE 0.9 % IV SOLN
2.0000 g | INTRAVENOUS | Status: AC
  Administered 2019-11-09 – 2019-11-12 (×4): 2 g via INTRAVENOUS
  Filled 2019-11-09 (×4): qty 20

## 2019-11-09 MED ORDER — CLONAZEPAM 0.5 MG PO TABS
0.5000 mg | ORAL_TABLET | Freq: Three times a day (TID) | ORAL | Status: DC | PRN
  Administered 2019-11-09 (×2): 0.5 mg via ORAL
  Filled 2019-11-09 (×2): qty 1

## 2019-11-09 MED ORDER — ENOXAPARIN SODIUM 40 MG/0.4ML ~~LOC~~ SOLN
40.0000 mg | Freq: Two times a day (BID) | SUBCUTANEOUS | Status: DC
  Administered 2019-11-09 – 2019-11-22 (×28): 40 mg via SUBCUTANEOUS
  Filled 2019-11-09 (×29): qty 0.4

## 2019-11-09 MED ORDER — ALBUTEROL SULFATE HFA 108 (90 BASE) MCG/ACT IN AERS
4.0000 | INHALATION_SPRAY | Freq: Four times a day (QID) | RESPIRATORY_TRACT | Status: DC
  Administered 2019-11-09 – 2019-11-11 (×4): 4 via RESPIRATORY_TRACT
  Filled 2019-11-09 (×2): qty 6.7

## 2019-11-09 MED ORDER — OXYMETAZOLINE HCL 0.05 % NA SOLN
2.0000 | Freq: Two times a day (BID) | NASAL | Status: AC
  Administered 2019-11-09 – 2019-11-10 (×2): 2 via NASAL
  Filled 2019-11-09: qty 30

## 2019-11-09 MED ORDER — LINAGLIPTIN 5 MG PO TABS
5.0000 mg | ORAL_TABLET | Freq: Every day | ORAL | Status: DC
  Administered 2019-11-09: 5 mg via ORAL
  Filled 2019-11-09 (×2): qty 1

## 2019-11-09 MED ORDER — HALOPERIDOL LACTATE 5 MG/ML IJ SOLN
5.0000 mg | Freq: Once | INTRAMUSCULAR | Status: AC
  Administered 2019-11-10: 5 mg via INTRAVENOUS
  Filled 2019-11-09: qty 1

## 2019-11-09 MED ORDER — METHYLPREDNISOLONE SODIUM SUCC 125 MG IJ SOLR
50.0000 mg | Freq: Two times a day (BID) | INTRAMUSCULAR | Status: DC
  Administered 2019-11-09 – 2019-11-10 (×3): 50 mg via INTRAVENOUS
  Filled 2019-11-09 (×3): qty 2

## 2019-11-09 MED ORDER — SODIUM CHLORIDE 0.9 % IV SOLN
500.0000 mg | INTRAVENOUS | Status: AC
  Administered 2019-11-09 – 2019-11-11 (×3): 500 mg via INTRAVENOUS
  Filled 2019-11-09 (×4): qty 500

## 2019-11-09 MED ORDER — INSULIN ASPART 100 UNIT/ML ~~LOC~~ SOLN
8.0000 [IU] | Freq: Three times a day (TID) | SUBCUTANEOUS | Status: DC
  Administered 2019-11-09: 8 [IU] via SUBCUTANEOUS

## 2019-11-09 MED ORDER — INSULIN GLARGINE 100 UNIT/ML ~~LOC~~ SOLN
25.0000 [IU] | Freq: Two times a day (BID) | SUBCUTANEOUS | Status: DC
  Administered 2019-11-09 – 2019-11-10 (×3): 25 [IU] via SUBCUTANEOUS
  Filled 2019-11-09 (×5): qty 0.25

## 2019-11-09 NOTE — Progress Notes (Addendum)
PROGRESS NOTE                                                                                                                                                                                                             Patient Demographics:    [name], is a 64 y.o. male, DOB - May 05, 1956, HFW:[id number]  Outpatient Primary MD for the patient is Lorre Munroe, NP   Admit date - 11/20/2019   LOS - 1  Chief Complaint  Patient presents with  . Respiratory Distress  . Covid Exposure       Brief Narrative: Patient is a 64 y.o. male with PMHx of CAD s/p PCI/CABG, bronchial asthma, DM-2, LD, HTN-presenting with a 2-week history of URI symptoms-cough-with gradually worsening shortness of breath.  Found to have COVID-19 pneumonia with hypoxemia and admitted to the hospitalist service.  Post admission-developed worsening hypoxemia requiring initiation of high flow oxygen.  See below for further details.  Significant Events: 4/2>> admit to [facility] for worsening hypoxemia 4/3>> worsening hypoxemia-on 15 L HFNC and NRB  COVID-19 medications: Steroids: 4/2>> Remdesivir: 4/2>> Actemra: 4/2x1  Antibiotics: Rocephin: 4/2>> Zithromax: 4/3>>  Microbiology data: 4/2: Blood cultures>> pending  DVT prophylaxis: SQ Lovenox (intermediate dosing)  Procedures: None  Consults: None    Subjective:    [name] today appears anxious--uncooperative with nursing staff.  Refusing NRB.  But otherwise comfortable-not using accessory muscles-speaking in full sentences.   Assessment  & Plan :   Acute Hypoxic Resp Failure due to Covid 19 Viral pneumonia and possible concurrent bacterial pneumonia: Remains severely hypoxemic-on 15 L of HFNC and NRB.  Change Decadron to Solu-Medrol-continue remdesivir.  Given elevated procalcitonin-continue Rocephin/Zithromax.  Encouraged use of incentive spirometry/flutter valve and prone positioning to  improve oxygenation.  He is very anxious-we will start low-dose Klonopin and monitor closely.  If he deteriorates significantly-he will require transfer to the ICU.  We will watch him/follow him closely.  Counseled patient extensively regarding importance of following MD/nursing orders-explained that he is tenuous and at risk for further deterioration.  He understands that he has a life-threatening condition and acknowledges that he will now be more compliant/cooperative with nursing staff.  MD also updated wife over the phone seriousness of patient's situation-and noncompliance/uncooperative with nursing staff.  Fever: afebrile  O2 requirements:  SpO2: 95 % O2 Flow  Rate (L/min): 15 L/min FiO2 (%): 100 %   COVID-19 Labs: Recent Labs    11/07/2019 1251 11/27/2019 2237 11/09/19 0336  DDIMER 1.02* 0.94* 1.67*  FERRITIN 1,172*  --   --   LDH 480*  --   --   CRP 33.8*  --  35.0*       Component Value Date/Time   BNP 55.9 11/09/2019 0339    Recent Labs  Lab 11/23/2019 1251 11/09/19 0336  PROCALCITON 1.44 3.26    Lab Results  Component Value Date   SARSCOV2NAA POSITIVE (A) 11/20/2019    Prone/Incentive Spirometry: encouraged patient to lie prone for 3-4 hours at a time for a total of 16 hours a day, and to encourage incentive spirometry use 3-4/hour.  Bronchial asthma with mild exacerbation: More wheezing this morning-starting scheduled albuterol-already on steroids.  Although wheezing-he is moving air well.  AKI: Likely hemodynamically mediated-improving.  Follow.  DM-2 (A1c 7.51/2) with uncontrolled hyperglycemia secondary to steroids: Change Lantus to 25 units twice daily, add 8 units of NovoLog with meals-continue resistant SSI.  If develops severe hypoglycemia due to steroids-will transition him to IV insulin infusion.  HLD: Continue statins/Zetia  CAD: History of CABG-no anginal symptoms-continue aspirin, statin  GERD: Continue PPI  GI prophylaxis: PPI  ABG:     Component Value Date/Time   TCO2 26 10/20/2016 0646    Vent Settings:  FiO2 (%):  [100 %] 100 %  Condition - Extremely Guarded-very tenuous with risk for further deterioration  Family Communication  :  Spouse updated over the phone  Code Status :  Full Code  Diet :  Diet Order            Diet heart healthy/carb modified Room service appropriate? Yes; Fluid consistency: Thin  Diet effective now               Disposition Plan  :  Remain hospitalized  Barriers to discharge: Hypoxia requiring O2 supplementation/complete 5 days of IV Remdesivir  Antimicorbials  :    Anti-infectives (From admission, onward)   Start     Dose/Rate Route Frequency Ordered Stop   11/09/19 2000  cefTRIAXone (ROCEPHIN) 2 g in sodium chloride 0.9 % 100 mL IVPB     2 g 200 mL/hr over 30 Minutes Intravenous Every 24 hours 11/09/19 0727 11/13/19 1959   11/09/19 1000  remdesivir 100 mg in sodium chloride 0.9 % 100 mL IVPB     100 mg 200 mL/hr over 30 Minutes Intravenous Daily 11/21/2019 1853 11/13/19 0959   11/09/19 0800  azithromycin (ZITHROMAX) 500 mg in sodium chloride 0.9 % 250 mL IVPB     500 mg 250 mL/hr over 60 Minutes Intravenous Every 24 hours 11/09/19 0727 11/12/19 0759   11/20/2019 1945  cefTRIAXone (ROCEPHIN) 2 g in sodium chloride 0.9 % 100 mL IVPB  Status:  Discontinued     2 g 200 mL/hr over 30 Minutes Intravenous Every 24 hours 11/29/2019 1940 11/09/19 0727   11/11/2019 1930  remdesivir 200 mg in sodium chloride 0.9% 250 mL IVPB     200 mg 580 mL/hr over 30 Minutes Intravenous Once 12/04/2019 1853 11/14/2019 2204      Inpatient Medications  Scheduled Meds: . aspirin EC  81 mg Oral QHS  . enoxaparin (LOVENOX) injection  40 mg Subcutaneous Q12H  . ezetimibe  10 mg Oral QHS  . fluticasone  2 spray Each Nare QHS  . gabapentin  900 mg Oral QHS  . insulin aspart  0-20  Units Subcutaneous TID WC  . insulin aspart  8 Units Subcutaneous TID WC  . insulin glargine  25 Units Subcutaneous BID  .  linagliptin  5 mg Oral Daily  . loratadine  10 mg Oral QHS  . magnesium oxide  800 mg Oral QHS  . methylPREDNISolone (SOLU-MEDROL) injection  50 mg Intravenous Q12H  . metoprolol succinate  25 mg Oral QHS  . oxymetazoline  2 spray Each Nare BID  . pantoprazole  40 mg Oral Daily  . simvastatin  40 mg Oral QHS  . traZODone  150 mg Oral QHS   Continuous Infusions: . azithromycin    . cefTRIAXone (ROCEPHIN)  IV    . remdesivir 100 mg in NS 100 mL     PRN Meds:.acetaminophen, albuterol, clonazePAM, nitroGLYCERIN   Time Spent in minutes  45  The patient is critically ill with multiple organ system failure and requires high complexity decision making for assessment and support, frequent evaluation and titration of therapies, advanced monitoring, review of radiographic studies and interpretation of complex data.   See all Orders from today for further details   Jeoffrey Massed M.D on 11/09/2019 at 9:50 AM  To page go to [email or web address] - use universal password  [facility] -  Office  [phone]    Objective:   Vitals:   11/09/19 0257 11/09/19 0400 11/09/19 0843 11/09/19 0930  BP:  125/85 126/81 126/81  Pulse:  87 93 93  Resp:  (!) 31 (!) 33 20  Temp:  97.6 F (36.4 C) 98.2 F (36.8 C)   TempSrc:  Axillary Oral   SpO2: (!) 86% (!) 84% (!) 84% 95%  Weight:      Height:        Wt Readings from Last 3 Encounters:  11/19/2019 90.3 kg  05/08/19 90.3 kg  04/30/19 90.3 kg     Intake/Output Summary (Last 24 hours) at 11/09/2019 0950 Last data filed at 11/09/2019 0700 Gross per 24 hour  Intake 884.15 ml  Output 1200 ml  Net -315.85 ml     Physical Exam Gen Exam:Alert awake-not in any distress HEENT:atraumatic, normocephalic Chest: B/L clear to auscultation anteriorly CVS:S1S2 regular Abdomen:soft non tender, non distended Extremities:no edema Neurology: Non focal Skin: no rash   Data Review:    CBC Recent Labs  Lab 11/30/2019 1251 11/09/19 0336  WBC 15.5*  8.1  HGB 17.4* 15.5  HCT 51.9 45.3  PLT 317 266  MCV 87.7 84.2  MCH 29.4 28.8  MCHC 33.5 34.2  RDW 12.3 12.1  LYMPHSABS 0.8 0.6*  MONOABS 0.8 0.2  EOSABS 0.0 0.0  BASOSABS 0.1 0.0    Chemistries  Recent Labs  Lab 11/19/2019 1251 11/09/19 0336  NA 132* 132*  K 5.4* 4.2  CL 90* 93*  CO2 21* 25  GLUCOSE 344* 357*  BUN 40* 34*  CREATININE 1.51* 1.07  CALCIUM 9.1 8.6*  AST 43* 43*  ALT 21 23  ALKPHOS 72 63  BILITOT 1.0 0.6   ------------------------------------------------------------------------------------------------------------------ Recent Labs    12/04/2019 1251  TRIG 182*    Lab Results  Component Value Date   HGBA1C 7.5 (H) 11/29/2019   ------------------------------------------------------------------------------------------------------------------ No results for input(s): TSH, T4TOTAL, T3FREE, THYROIDAB in the last 72 hours.  Invalid input(s): FREET3 ------------------------------------------------------------------------------------------------------------------ Recent Labs    11/19/2019 1251  FERRITIN 1,172*    Coagulation profile No results for input(s): INR, PROTIME in the last 168 hours.  Recent Labs    11/24/2019 2237 11/09/19 0336  DDIMER 0.94* 1.67*  Cardiac Enzymes No results for input(s): CKMB, TROPONINI, MYOGLOBIN in the last 168 hours.  Invalid input(s): CK ------------------------------------------------------------------------------------------------------------------    Component Value Date/Time   BNP 55.9 11/09/2019 3419    Micro Results Recent Results (from the past 240 hour(s))  SARS CORONAVIRUS 2 (TAT 6-24 HRS) Nasopharyngeal Nasopharyngeal Swab     Status: Abnormal   Collection Time: 12-05-19  1:08 PM   Specimen: Nasopharyngeal Swab  Result Value Ref Range Status   SARS Coronavirus 2 POSITIVE (A) NEGATIVE Final    Comment: RESULT CALLED TO, READ BACK BY AND VERIFIED WITH: W.PAYAN RN [id number] 12/05/2019 MCCORMICK  K (NOTE) SARS-CoV-2 target nucleic acids are DETECTED. The SARS-CoV-2 RNA is generally detectable in upper and lower respiratory specimens during the acute phase of infection. Positive results are indicative of the presence of SARS-CoV-2 RNA. Clinical correlation with patient history and other diagnostic information is  necessary to determine patient infection status. Positive results do not rule out bacterial infection or co-infection with other viruses.  The expected result is Negative. Fact Sheet for Patients: SugarRoll.be Fact Sheet for Healthcare Providers: https://www.woods-mathews.com/ This test is not yet approved or cleared by the [place] FDA and  has been authorized for detection and/or diagnosis of SARS-CoV-2 by FDA under an Emergency Use Authorization (EUA). This EUA will remain  in effect (meaning this test can be used) for th e duration of the COVID-19 declaration under Section 564(b)(1) of the Act, 21 U.S.C. section 360bbb-3(b)(1), unless the authorization is terminated or revoked sooner. Performed at [facility], [place] [address] [place], [place] [address]     Radiology Reports DG Chest [place] 1 View  Result Date: 05-Dec-2019 CLINICAL DATA:  SOB, increased B.P., tachycardia, O2 sats in low - mid 90's, COVID exposure. Hx CABG 2005, Diabetes, Chronic bronchitis, and asthma. EXAM: PORTABLE CHEST - 1 VIEW COMPARISON:  08/04/2015 FINDINGS: Lungs are clear. Heart size and mediastinal contours are within normal limits. Previous CABG. No definite effusion. No pneumothorax. Cervical fixation hardware noted. IMPRESSION: No acute disease post CABG. Electronically Signed   By: Lucrezia Europe M.D.   On: Dec 05, 2019 13:41   DG Chest Port 1V same Day  Result Date: 11/09/2019 CLINICAL DATA:  Respiratory distress. COVID positive unit. EXAM: PORTABLE CHEST 1 VIEW COMPARISON:  Chest x-ray dated 2019/12/05. FINDINGS: Borderline cardiomegaly,  stable. New hazy ground-glass opacities within the mid and lower lung zones bilaterally, suspicious for multifocal pneumonia. No pleural effusion or pneumothorax is seen. IMPRESSION: New hazy ground-glass opacities within the mid and lower lung zones bilaterally, suspicious for multifocal pneumonia, alternatively mild edema and/or atelectasis. Electronically Signed   By: Franki Cabot M.D.   On: 11/09/2019 08:24

## 2019-11-09 NOTE — Progress Notes (Signed)
Spoke with pt wife

## 2019-11-09 NOTE — Progress Notes (Signed)
VASCULAR LAB PRELIMINARY  PRELIMINARY  PRELIMINARY  PRELIMINARY  Bilateral lower extremity venous duplex completed.    Preliminary report:  See CV proc for preliminary results.   Lee-Anne Flicker, RVT 11/09/2019, 10:52 AM

## 2019-11-09 NOTE — Plan of Care (Signed)
  Problem: Education: Goal: Knowledge of General Education information will improve Description: Including pain rating scale, medication(s)/side effects and non-pharmacologic comfort measures Outcome: Not Progressing   Problem: Health Behavior/Discharge Planning: Goal: Ability to manage health-related needs will improve Outcome: Not Progressing   

## 2019-11-09 NOTE — Progress Notes (Signed)
Dr. Ranell Patrick made aware about midnight blood glucose. No new orders. Will continue to monitor.

## 2019-11-09 NOTE — Progress Notes (Signed)
Patient refusing to have oxisensor on ear lobe, and is also refusing NRB. Sats low 80s. MD notified, will continue to monitor.

## 2019-11-10 ENCOUNTER — Inpatient Hospital Stay (HOSPITAL_COMMUNITY): Payer: Medicare Other

## 2019-11-10 DIAGNOSIS — J1282 Pneumonia due to Coronavirus disease 2019: Secondary | ICD-10-CM

## 2019-11-10 DIAGNOSIS — J4520 Mild intermittent asthma, uncomplicated: Secondary | ICD-10-CM | POA: Diagnosis not present

## 2019-11-10 DIAGNOSIS — I10 Essential (primary) hypertension: Secondary | ICD-10-CM | POA: Diagnosis not present

## 2019-11-10 DIAGNOSIS — U071 COVID-19: Secondary | ICD-10-CM

## 2019-11-10 DIAGNOSIS — J9601 Acute respiratory failure with hypoxia: Secondary | ICD-10-CM | POA: Diagnosis not present

## 2019-11-10 DIAGNOSIS — R410 Disorientation, unspecified: Secondary | ICD-10-CM

## 2019-11-10 LAB — COMPREHENSIVE METABOLIC PANEL
ALT: 27 U/L (ref 0–44)
AST: 55 U/L — ABNORMAL HIGH (ref 15–41)
Albumin: 2.5 g/dL — ABNORMAL LOW (ref 3.5–5.0)
Alkaline Phosphatase: 78 U/L (ref 38–126)
Anion gap: 16 — ABNORMAL HIGH (ref 5–15)
BUN: 40 mg/dL — ABNORMAL HIGH (ref 8–23)
CO2: 24 mmol/L (ref 22–32)
Calcium: 9 mg/dL (ref 8.9–10.3)
Chloride: 97 mmol/L — ABNORMAL LOW (ref 98–111)
Creatinine, Ser: 1.15 mg/dL (ref 0.61–1.24)
GFR calc Af Amer: >60 mL/min (ref >60)
GFR calc non Af Amer: >60 mL/min (ref >60)
Glucose, Bld: 348 mg/dL — ABNORMAL HIGH (ref 70–99)
Potassium: 4.2 mmol/L (ref 3.5–5.1)
Sodium: 137 mmol/L (ref 135–145)
Total Bilirubin: 0.8 mg/dL (ref 0.3–1.2)
Total Protein: 7.1 g/dL (ref 6.5–8.1)

## 2019-11-10 LAB — CBC WITH DIFFERENTIAL/PLATELET
Abs Immature Granulocytes: 0.1 10*3/uL — ABNORMAL HIGH (ref 0.00–0.07)
Basophils Absolute: 0 10*3/uL (ref 0.0–0.1)
Basophils Relative: 0 %
Eosinophils Absolute: 0 10*3/uL (ref 0.0–0.5)
Eosinophils Relative: 0 %
HCT: 46.1 % (ref 39.0–52.0)
Hemoglobin: 15.6 g/dL (ref 13.0–17.0)
Immature Granulocytes: 1 %
Lymphocytes Relative: 4 %
Lymphs Abs: 0.7 10*3/uL (ref 0.7–4.0)
MCH: 29.1 pg (ref 26.0–34.0)
MCHC: 33.8 g/dL (ref 30.0–36.0)
MCV: 85.8 fL (ref 80.0–100.0)
Monocytes Absolute: 0.4 10*3/uL (ref 0.1–1.0)
Monocytes Relative: 3 %
Neutro Abs: 14.6 10*3/uL — ABNORMAL HIGH (ref 1.7–7.7)
Neutrophils Relative %: 92 %
Platelets: 327 10*3/uL (ref 150–400)
RBC: 5.37 MIL/uL (ref 4.22–5.81)
RDW: 12.3 % (ref 11.5–15.5)
WBC: 15.8 10*3/uL — ABNORMAL HIGH (ref 4.0–10.5)
nRBC: 0 % (ref 0.0–0.2)

## 2019-11-10 LAB — GLUCOSE, CAPILLARY
Glucose-Capillary: 376 mg/dL — ABNORMAL HIGH (ref 70–99)
Glucose-Capillary: 344 mg/dL — ABNORMAL HIGH (ref 70–99)
Glucose-Capillary: 255 mg/dL — ABNORMAL HIGH (ref 70–99)
Glucose-Capillary: 132 mg/dL — ABNORMAL HIGH (ref 70–99)
Glucose-Capillary: 170 mg/dL — ABNORMAL HIGH (ref 70–99)
Glucose-Capillary: 193 mg/dL — ABNORMAL HIGH (ref 70–99)

## 2019-11-10 LAB — POCT I-STAT 7, (LYTES, BLD GAS, ICA,H+H)
Acid-Base Excess: 2 mmol/L (ref 0.0–2.0)
Bicarbonate: 26.5 mmol/L (ref 20.0–28.0)
Calcium, Ion: 1.27 mmol/L (ref 1.15–1.40)
HCT: 43 % (ref 39.0–52.0)
Hemoglobin: 14.6 g/dL (ref 13.0–17.0)
O2 Saturation: 77 %
Patient temperature: 98.6
Potassium: 4.5 mmol/L (ref 3.5–5.1)
Sodium: 141 mmol/L (ref 135–145)
TCO2: 28 mmol/L (ref 22–32)
pCO2 arterial: 38.5 mmHg (ref 32.0–48.0)
pH, Arterial: 7.446 (ref 7.350–7.450)
pO2, Arterial: 40 mmHg — CL (ref 83.0–108.0)

## 2019-11-10 LAB — BLOOD GAS, ARTERIAL
Acid-Base Excess: 1.7 mmol/L (ref 0.0–2.0)
Bicarbonate: 25.5 mmol/L (ref 20.0–28.0)
Drawn by: 51185
FIO2: 80
O2 Saturation: 76.8 %
Patient temperature: 36.5
pCO2 arterial: 37.5 mmHg (ref 32.0–48.0)
pH, Arterial: 7.445 (ref 7.350–7.450)
pO2, Arterial: 43.8 mmHg — ABNORMAL LOW (ref 83.0–108.0)

## 2019-11-10 LAB — D-DIMER, QUANTITATIVE: D-Dimer, Quant: 1.01 ug/mL-FEU — ABNORMAL HIGH (ref 0.00–0.50)

## 2019-11-10 LAB — C-REACTIVE PROTEIN: CRP: 18.8 mg/dL — ABNORMAL HIGH (ref <1.0)

## 2019-11-10 MED ORDER — ROCURONIUM BROMIDE 10 MG/ML (PF) SYRINGE
PREFILLED_SYRINGE | INTRAVENOUS | Status: AC
  Filled 2019-11-10: qty 10

## 2019-11-10 MED ORDER — LORAZEPAM 2 MG/ML IJ SOLN
1.0000 mg | Freq: Once | INTRAMUSCULAR | Status: AC
  Administered 2019-11-10: 2 mg via INTRAVENOUS
  Filled 2019-11-10: qty 1

## 2019-11-10 MED ORDER — CYCLOBENZAPRINE HCL 5 MG PO TABS
5.0000 mg | ORAL_TABLET | Freq: Three times a day (TID) | ORAL | Status: DC
  Administered 2019-11-11: 5 mg via ORAL
  Filled 2019-11-10 (×6): qty 1

## 2019-11-10 MED ORDER — LORAZEPAM 2 MG/ML IJ SOLN
0.5000 mg | INTRAMUSCULAR | Status: DC | PRN
  Administered 2019-11-11: 08:00:00 0.5 mg via INTRAVENOUS
  Filled 2019-11-10: qty 1

## 2019-11-10 MED ORDER — INSULIN ASPART 100 UNIT/ML ~~LOC~~ SOLN: 12.0000 [IU] | Freq: Three times a day (TID) | SUBCUTANEOUS | Status: DC

## 2019-11-10 MED ORDER — LORAZEPAM 2 MG/ML IJ SOLN
1.0000 mg | INTRAMUSCULAR | Status: DC | PRN
  Administered 2019-11-10: 1 mg via INTRAVENOUS
  Filled 2019-11-10: qty 1

## 2019-11-10 MED ORDER — INSULIN GLARGINE 100 UNIT/ML ~~LOC~~ SOLN
8.0000 [IU] | Freq: Once | SUBCUTANEOUS | Status: AC
  Administered 2019-11-11: 8 [IU] via SUBCUTANEOUS
  Filled 2019-11-10: qty 0.08

## 2019-11-10 MED ORDER — KETAMINE HCL-SODIUM CHLORIDE 100-0.9 MG/10ML-% IV SOSY
180.0000 mg | PREFILLED_SYRINGE | Freq: Once | INTRAVENOUS | Status: DC
  Filled 2019-11-10: qty 20

## 2019-11-10 MED ORDER — DEXMEDETOMIDINE HCL IN NACL 400 MCG/100ML IV SOLN
0.4000 ug/kg/h | INTRAVENOUS | Status: DC
  Administered 2019-11-10: 0.5 ug/kg/h via INTRAVENOUS
  Administered 2019-11-11: 1.5 ug/kg/h via INTRAVENOUS
  Filled 2019-11-10 (×3): qty 100

## 2019-11-10 MED ORDER — FENTANYL CITRATE (PF) 100 MCG/2ML IJ SOLN
INTRAMUSCULAR | Status: AC
  Filled 2019-11-10: qty 2

## 2019-11-10 MED ORDER — METHYLPREDNISOLONE SODIUM SUCC 40 MG IJ SOLR
40.0000 mg | Freq: Two times a day (BID) | INTRAMUSCULAR | Status: DC
  Administered 2019-11-10 – 2019-11-11 (×2): 40 mg via INTRAVENOUS
  Filled 2019-11-10 (×2): qty 1

## 2019-11-10 MED ORDER — INSULIN GLARGINE 100 UNIT/ML ~~LOC~~ SOLN
32.0000 [IU] | Freq: Two times a day (BID) | SUBCUTANEOUS | Status: DC
  Filled 2019-11-10 (×3): qty 0.32

## 2019-11-10 MED ORDER — CHLORHEXIDINE GLUCONATE CLOTH 2 % EX PADS
6.0000 | MEDICATED_PAD | Freq: Every day | CUTANEOUS | Status: DC
  Administered 2019-11-10 – 2019-12-15 (×35): 6 via TOPICAL

## 2019-11-10 MED ORDER — ZIPRASIDONE MESYLATE 20 MG IM SOLR
20.0000 mg | INTRAMUSCULAR | Status: DC | PRN
  Filled 2019-11-10: qty 20

## 2019-11-10 MED ORDER — FENTANYL CITRATE (PF) 100 MCG/2ML IJ SOLN
100.0000 ug | Freq: Once | INTRAMUSCULAR | Status: DC
  Filled 2019-11-10: qty 2

## 2019-11-10 MED ORDER — ROCURONIUM BROMIDE 50 MG/5ML IV SOLN
100.0000 mg | Freq: Once | INTRAVENOUS | Status: DC
  Filled 2019-11-10: qty 10

## 2019-11-10 NOTE — Progress Notes (Signed)
eLink Physician-Brief Progress Note Patient Name: [name] DOB: 1956/06/13 MRN: [id number]   Date of Service  11/10/2019  HPI/Events of Note  Notified that patient became obtunded with sats in the 70s on HFNC/100% NRB. Precedex running at 0.5 has been turned off.  eICU Interventions   Patient now more awake with O2 sat 88-90%  CBG 170s  Switch to heated high flow and continue to monitor  ABG ordered  Dr Thora Lance at bedside     Intervention Category Major Interventions: Hypoxemia - evaluation and management;Change in mental status - evaluation and management  Darl Pikes 11/10/2019, 8:27 PM

## 2019-11-10 NOTE — Progress Notes (Addendum)
Pt awake and pulling NR mask off ALL night, pt received ALL scheduled and PRN medications and still awake and fighting restraints, if pt is asleep during day shift it's because he was awake ALL night, pt O2 saturation is in low 90's as long as pt keeps his NR mask on, pt continues to be confused and agitated.  0430 Ativan 2mg  given, pt lightly sedated, HFNC and NR applied, O2 sat 91%, RR35, MD and Rapid notified, charge RN notified, will continue to monitor.

## 2019-11-10 NOTE — Significant Event (Signed)
Rapid Response Event Note  Overview:Called originally at 0431 because pt who has been difficult to keep calm all night(given neurotin, trazadone, geodon, ativan) had decreased LOC and tachypnea. Pt was responsive to pain at that time and, besides increased RR, VSS. NP was notified and decision made to monitor. Called again at 0603 because pt awake, SpO2-81% on 15L HFNC and NRB, and RR-40s.   Initial Focused Assessment: Pt laying in bed with +WOB, +accessory muscle use, SpO2-87%, RR-40s. Pt agitated, thrashing in bed, but back to prior mental status per RN. Lungs diminished t/o with crackles heard in RLL. Skin warm and dry. T-98.3, HR-111, BP-127/11, RR-43, SpO2-85% while asleep and up to 89% when awake and agitated.   Interventions: PCXR ABG Plan of Care (if not transferred): Await test results and relay to MD. Pt [place] need HHFNC or bipap for WOB. Continue to monitor pt. Call RRT if further assistance needed.  Event Summary: Called: 1643 and 0603 Arrived: 0608  B[place]heimer, NP notified PTA RRT       Terrilyn Saver

## 2019-11-10 NOTE — Progress Notes (Addendum)
PROGRESS NOTE                                                                                                                                                                                                             Patient Demographics:    [name], is a 64 y.o. male, DOB - [birth year], ELM:[id number]  Outpatient Primary MD for the patient is Lorre Munroe, NP   Admit date - 2019-11-18   LOS - 2  Chief Complaint  Patient presents with  . Respiratory Distress  . Covid Exposure       Brief Narrative: Patient is a 64 y.o. male with PMHx of CAD s/p PCI/CABG, bronchial asthma, DM-2, LD, HTN-presenting with a 2-week history of URI symptoms-cough-with gradually worsening shortness of breath.  Found to have COVID-19 pneumonia with hypoxemia and admitted to the hospitalist service.  Post admission-developed worsening hypoxemia requiring initiation of high flow oxygen.  See below for further details.  Significant Events: 4/2>> admit to [facility] for worsening hypoxemia 4/3>> worsening hypoxemia-on 15 L HFNC and NRB 4/3>>Lower ext Doppler neg for DVT 4/4>>worsening confusion-pulling NRB-transfer to ICU  COVID-19 medications: Steroids: 4/2>> Remdesivir: 4/2>> Actemra: 4/2x1  Antibiotics: Rocephin: 4/2>> Zithromax: 4/3>>  Microbiology data: 4/2: Blood cultures>> pending  DVT prophylaxis: SQ Lovenox (intermediate dosing)  Procedures: None  Consults: PCCM   Subjective:   Became agitated/confused last night-refused NRB last night-got Ativan and Haldol-this morning he is drowsy but opens eyes mumbles-he is on two-point restraints.  This morning he also keeps on pulling out his NRB.   Assessment  & Plan :   Acute Hypoxic Resp Failure due to Covid 19 Viral pneumonia and possible concurrent bacterial pneumonia: Remains severely hypoxemic on 15 L of HFNC and NRB.  Start tapering down Solu-Medrol-now that CRP is  downtrending.  Remains on remdesivir/Rocephin/Zithromax.  If he is still noncompliant and keeps pulling down his NRB-with ongoing confusion/delirium-he [place] need to be monitored closely in the ICU.  We will follow closely.  Fever: afebrile  O2 requirements:  SpO2: (!) 88 % O2 Flow Rate (L/min): 15 L/min FiO2 (%): 100 %   COVID-19 Labs: Recent Labs    November 18, 2019 1251 11/18/2019 1251 2019-11-18 2237 11/09/19 0336 11/10/19 0631  DDIMER 1.02*   < > 0.94* 1.67* 1.01*  FERRITIN 1,172*  --   --   --   --  LDH 480*  --   --   --   --   CRP 33.8*  --   --  35.0* 18.8*   < > = values in this interval not displayed.       Component Value Date/Time   BNP 55.9 11/09/2019 0339    Recent Labs  Lab 11/22/2019 1251 11/09/19 0336  PROCALCITON 1.44 3.26    Lab Results  Component Value Date   SARSCOV2NAA POSITIVE (A) 11/30/2019    Prone/Incentive Spirometry: encouraged patient to lie prone for 3-4 hours at a time for a total of 16 hours a day, and to encourage incentive spirometry use 3-4/hour.  Acute metabolic encephalopathy: Not sure if this is from hypoxemia-or from steroids-or due to Haldol/Ativan that he received last night.  Decreasing steroid slightly-currently in restraints-denies alcohol use-but awaiting callback from spouse to collaborate.  Given severity of hypoxemia-confusion-patient removing NRB-[place] need to be monitored in the ICU.  If/confusion persists-[place] need to be started on low-dose Precedex to ensure adequate oxygenation.  Bronchial asthma with mild exacerbation: Improved-no wheezing-continue bronchodilators-already on steroids.    AKI: Likely hemodynamically mediated-improving.  Follow.  DM-2 (A1c 7.51/2) with uncontrolled hyperglycemia secondary to steroids: CBGs remain uncontrolled-change Lantus to 30 units twice daily, increase Premeal NovoLog to 12 units-continue resistant SSI.    HLD: Continue statins/Zetia  CAD: History of CABG-no anginal symptoms-continue aspirin,  statin  GERD: Continue PPI  GI prophylaxis: PPI  ABG:    Component Value Date/Time   PHART 7.445 11/10/2019 0820   PCO2ART 37.5 11/10/2019 0820   PO2ART 43.8 (L) 11/10/2019 0820   HCO3 25.5 11/10/2019 0820   TCO2 26 10/20/2016 0646   O2SAT 76.8 11/10/2019 0820    Vent Settings:     Condition - Extremely Guarded-very tenuous with risk for further deterioration  Family Communication  : Left message for spouse  Addendum-wife called back-she has been updated.  Code Status :  Full Code  Diet :  Diet Order            Diet heart healthy/carb modified Room service appropriate? Yes; Fluid consistency: Thin  Diet effective now               Disposition Plan  :  Remain hospitalized [place] need to be transferred to the ICU if confusion/encephalopathy persists.  Barriers to discharge: Hypoxia requiring O2 supplementation/complete 5 days of IV Remdesivir  Antimicorbials  :    Anti-infectives (From admission, onward)   Start     Dose/Rate Route Frequency Ordered Stop   11/09/19 2000  cefTRIAXone (ROCEPHIN) 2 g in sodium chloride 0.9 % 100 mL IVPB     2 g 200 mL/hr over 30 Minutes Intravenous Every 24 hours 11/09/19 0727 11/13/19 1959   11/09/19 1000  remdesivir 100 mg in sodium chloride 0.9 % 100 mL IVPB     100 mg 200 mL/hr over 30 Minutes Intravenous Daily 11/07/2019 1853 11/13/19 0959   11/09/19 0800  azithromycin (ZITHROMAX) 500 mg in sodium chloride 0.9 % 250 mL IVPB     500 mg 250 mL/hr over 60 Minutes Intravenous Every 24 hours 11/09/19 0727 11/12/19 0759   11/21/2019 1945  cefTRIAXone (ROCEPHIN) 2 g in sodium chloride 0.9 % 100 mL IVPB  Status:  Discontinued     2 g 200 mL/hr over 30 Minutes Intravenous Every 24 hours 11/30/2019 1940 11/09/19 0727   11/22/2019 1930  remdesivir 200 mg in sodium chloride 0.9% 250 mL IVPB     200 mg 580  mL/hr over 30 Minutes Intravenous Once 12/04/2019 1853 11/07/2019 2204      Inpatient Medications  Scheduled Meds: . albuterol  4 puff  Inhalation Q6H  . aspirin EC  81 mg Oral QHS  . cyclobenzaprine  5 mg Oral TID  . enoxaparin (LOVENOX) injection  40 mg Subcutaneous Q12H  . ezetimibe  10 mg Oral QHS  . fluticasone  2 spray Each Nare QHS  . gabapentin  900 mg Oral QHS  . insulin aspart  0-20 Units Subcutaneous TID WC  . insulin aspart  12 Units Subcutaneous TID WC  . insulin glargine  32 Units Subcutaneous BID  . linagliptin  5 mg Oral Daily  . loratadine  10 mg Oral QHS  . magnesium oxide  800 mg Oral QHS  . methylPREDNISolone (SOLU-MEDROL) injection  40 mg Intravenous Q12H  . metoprolol succinate  25 mg Oral QHS  . oxymetazoline  2 spray Each Nare BID  . pantoprazole  40 mg Oral Daily  . simvastatin  40 mg Oral QHS  . traZODone  150 mg Oral QHS   Continuous Infusions: . azithromycin 500 mg (11/10/19 0802)  . cefTRIAXone (ROCEPHIN)  IV Stopped (11/09/19 2200)  . remdesivir 100 mg in NS 100 mL 100 mg (11/10/19 0948)   PRN Meds:.acetaminophen, clonazePAM, LORazepam, nitroGLYCERIN   Time Spent in minutes  45  The patient is critically ill with multiple organ system failure and requires high complexity decision making for assessment and support, frequent evaluation and titration of therapies, advanced monitoring, review of radiographic studies and interpretation of complex data.   See all Orders from today for further details   Oren Binet M.D on 11/10/2019 at 10:46 AM  To page go to [email or web address] - use universal password  [facility] -  Office  [phone]    Objective:   Vitals:   11/10/19 0656 11/10/19 0658 11/10/19 0800 11/10/19 0831  BP:   129/83 129/83  Pulse: (!) 110 (!) 107 84 99  Resp: (!) 28 (!) 28 (!) 30 (!) 32  Temp:   97.8 F (36.6 C)   TempSrc:   Axillary   SpO2: (!) 88% (!) 86% (!) 87% (!) 88%  Weight:      Height:        Wt Readings from Last 3 Encounters:  12/06/2019 90.3 kg  05/08/19 90.3 kg  04/30/19 90.3 kg     Intake/Output Summary (Last 24 hours) at 11/10/2019  1046 Last data filed at 11/10/2019 0600 Gross per 24 hour  Intake 685.8 ml  Output 1150 ml  Net -464.2 ml     Physical Exam Gen Exam: Opens eyes-mumbles-not in acute respiratory distress.   HEENT:atraumatic, normocephalic Chest: B/L clear to auscultation anteriorly CVS:S1S2 regular Abdomen:soft non tender, non distended Extremities:no edema Neurology: Moves all 4 extremities. Skin: no rash   Data Review:    CBC Recent Labs  Lab 11/20/2019 1251 11/09/19 0336 11/10/19 0631  WBC 15.5* 8.1 15.8*  HGB 17.4* 15.5 15.6  HCT 51.9 45.3 46.1  PLT 317 266 327  MCV 87.7 84.2 85.8  MCH 29.4 28.8 29.1  MCHC 33.5 34.2 33.8  RDW 12.3 12.1 12.3  LYMPHSABS 0.8 0.6* 0.7  MONOABS 0.8 0.2 0.4  EOSABS 0.0 0.0 0.0  BASOSABS 0.1 0.0 0.0    Chemistries  Recent Labs  Lab 11/27/2019 1251 11/09/19 0336 11/10/19 0631  NA 132* 132* 137  K 5.4* 4.2 4.2  CL 90* 93* 97*  CO2 21* 25 24  GLUCOSE 344*  357* 348*  BUN 40* 34* 40*  CREATININE 1.51* 1.07 1.15  CALCIUM 9.1 8.6* 9.0  AST 43* 43* 55*  ALT 21 23 27   ALKPHOS 72 63 78  BILITOT 1.0 0.6 0.8   ------------------------------------------------------------------------------------------------------------------ Recent Labs    11/26/2019 1251  TRIG 182*    Lab Results  Component Value Date   HGBA1C 7.5 (H) 11/07/2019   ------------------------------------------------------------------------------------------------------------------ No results for input(s): TSH, T4TOTAL, T3FREE, THYROIDAB in the last 72 hours.  Invalid input(s): FREET3 ------------------------------------------------------------------------------------------------------------------ Recent Labs    11/20/2019 1251  FERRITIN 1,172*    Coagulation profile No results for input(s): INR, PROTIME in the last 168 hours.  Recent Labs    11/09/19 0336 11/10/19 0631  DDIMER 1.67* 1.01*    Cardiac Enzymes No results for input(s): CKMB, TROPONINI, MYOGLOBIN in the last  168 hours.  Invalid input(s): CK ------------------------------------------------------------------------------------------------------------------    Component Value Date/Time   BNP 55.9 11/09/2019 01/09/2020    Micro Results Recent Results (from the past 240 hour(s))  Blood Culture (routine x 2)     Status: None (Preliminary result)   Collection Time: 11/24/2019 12:51 PM   Specimen: BLOOD  Result Value Ref Range Status   Specimen Description BLOOD LEFT ANTECUBITAL  Final   Special Requests   Final    BOTTLES DRAWN AEROBIC AND ANAEROBIC Blood Culture results [place] not be optimal due to an inadequate volume of blood received in culture bottles   Culture   Final    NO GROWTH 2 DAYS Performed at [facility], [address], [place], [place]    Report Status PENDING  Incomplete  SARS CORONAVIRUS 2 (TAT 6-24 HRS) Nasopharyngeal Nasopharyngeal Swab     Status: Abnormal   Collection Time: 11/07/2019  1:08 PM   Specimen: Nasopharyngeal Swab  Result Value Ref Range Status   SARS Coronavirus 2 POSITIVE (A) NEGATIVE Final    Comment: RESULT CALLED TO, READ BACK BY AND VERIFIED WITH: W.PAYAN RN 1817 11/07/2019 MCCORMICK K (NOTE) SARS-CoV-2 target nucleic acids are DETECTED. The SARS-CoV-2 RNA is generally detectable in upper and lower respiratory specimens during the acute phase of infection. Positive results are indicative of the presence of SARS-CoV-2 RNA. Clinical correlation with patient history and other diagnostic information is  necessary to determine patient infection status. Positive results do not rule out bacterial infection or co-infection with other viruses.  The expected result is Negative. Fact Sheet for Patients: 01/08/20 Fact Sheet for Healthcare Providers: HairSlick.no This test is not yet approved or cleared by the quierodirigir.com FDA and  has been authorized for detection and/or diagnosis of SARS-CoV-2  by FDA under an Emergency Use Authorization (EUA). This EUA will remain  in effect (meaning this test can be used) for th e duration of the COVID-19 declaration under Section 564(b)(1) of the Act, 21 U.S.C. section 360bbb-3(b)(1), unless the authorization is terminated or revoked sooner. Performed at [facility], [address], [place], [place]   Blood Culture (routine x 2)     Status: None (Preliminary result)   Collection Time: 11/09/2019  2:53 PM   Specimen: BLOOD  Result Value Ref Range Status   Specimen Description BLOOD RIGHT ANTECUBITAL  Final   Special Requests   Final    BOTTLES DRAWN AEROBIC AND ANAEROBIC Blood Culture adequate volume   Culture   Final    NO GROWTH 2 DAYS Performed at [facility], [address] [place], [place]  Report Status PENDING  Incomplete    Radiology Reports DG Chest Port 1 View  Result Date: 11/10/2019 CLINICAL DATA:  COVID positive on 11/26/2019. Confused. Respiratory distress. EXAM: PORTABLE CHEST 1 VIEW COMPARISON:  11/09/2019 and earlier exams. FINDINGS: Interstitial and subtle hazy airspace lung opacities in the mid to lower lungs is without change from the previous exam. No new lung abnormalities. No convincing pleural effusion and no pneumothorax. Stable changes from prior CABG surgery. IMPRESSION: 1. No significant change from the previous day's study. 2. Interstitial and subtle hazy airspace lung opacities that are consistent with multifocal pneumonia. Electronically Signed   By: Amie Portland M.D.   On: 11/10/2019 07:18   DG Chest Port 1 View  Result Date: 11/18/2019 CLINICAL DATA:  SOB, increased B.P., tachycardia, O2 sats in low - mid 90's, COVID exposure. Hx CABG 2005, Diabetes, Chronic bronchitis, and asthma. EXAM: PORTABLE CHEST - 1 VIEW COMPARISON:  08/04/2015 FINDINGS: Lungs are clear. Heart size and mediastinal contours are within normal limits. Previous CABG. No definite effusion. No pneumothorax.  Cervical fixation hardware noted. IMPRESSION: No acute disease post CABG. Electronically Signed   By: Corlis Leak M.D.   On: 11/30/2019 13:41   DG Chest Port 1V same Day  Result Date: 11/09/2019 CLINICAL DATA:  Respiratory distress. COVID positive unit. EXAM: PORTABLE CHEST 1 VIEW COMPARISON:  Chest x-ray dated 11/30/2019. FINDINGS: Borderline cardiomegaly, stable. New hazy ground-glass opacities within the mid and lower lung zones bilaterally, suspicious for multifocal pneumonia. No pleural effusion or pneumothorax is seen. IMPRESSION: New hazy ground-glass opacities within the mid and lower lung zones bilaterally, suspicious for multifocal pneumonia, alternatively mild edema and/or atelectasis. Electronically Signed   By: Bary Richard M.D.   On: 11/09/2019 08:24   VAS [place] LOWER EXTREMITY VENOUS (DVT)  Result Date: 11/10/2019  Lower Venous DVTStudy Indications: Covid-19, elevated D-dimer.  Comparison Study: No prior study on file Performing Technologist: Sherren Kerns RVS  Examination Guidelines: A complete evaluation includes B-mode imaging, spectral Doppler, color Doppler, and power Doppler as needed of all accessible portions of each vessel. Bilateral testing is considered an integral part of a complete examination. Limited examinations for reoccurring indications [place] be performed as noted. The reflux portion of the exam is performed with the patient in reverse Trendelenburg.  +---------+---------------+---------+-----------+----------+--------------+ RIGHT    CompressibilityPhasicitySpontaneityPropertiesThrombus Aging +---------+---------------+---------+-----------+----------+--------------+ CFV      Full           Yes      Yes                                 +---------+---------------+---------+-----------+----------+--------------+ SFJ      Full                                                        +---------+---------------+---------+-----------+----------+--------------+ FV  Prox  Full                                                        +---------+---------------+---------+-----------+----------+--------------+ FV Mid   Full                                                        +---------+---------------+---------+-----------+----------+--------------+  FV DistalFull                                                        +---------+---------------+---------+-----------+----------+--------------+ PFV      Full                                                        +---------+---------------+---------+-----------+----------+--------------+ POP      Full           Yes      Yes                                 +---------+---------------+---------+-----------+----------+--------------+ PTV      Full                                                        +---------+---------------+---------+-----------+----------+--------------+ PERO     Full                                                        +---------+---------------+---------+-----------+----------+--------------+   +---------+---------------+---------+-----------+----------+--------------+ LEFT     CompressibilityPhasicitySpontaneityPropertiesThrombus Aging +---------+---------------+---------+-----------+----------+--------------+ CFV      Full           Yes      Yes                                 +---------+---------------+---------+-----------+----------+--------------+ SFJ      Full                                                        +---------+---------------+---------+-----------+----------+--------------+ FV Prox  Full                                                        +---------+---------------+---------+-----------+----------+--------------+ FV Mid   Full                                                        +---------+---------------+---------+-----------+----------+--------------+ FV DistalFull                                                         +---------+---------------+---------+-----------+----------+--------------+  PFV      Full                                                        +---------+---------------+---------+-----------+----------+--------------+ POP      Full           Yes      Yes                                 +---------+---------------+---------+-----------+----------+--------------+ PTV      Full                                                        +---------+---------------+---------+-----------+----------+--------------+ PERO     Full                                                        +---------+---------------+---------+-----------+----------+--------------+     Summary: BILATERAL: - No evidence of deep vein thrombosis seen in the lower extremities, bilaterally.   *See table(s) above for measurements and observations. Electronically signed by Gretta Began MD on 11/10/2019 at 8:17:42 AM.    Final

## 2019-11-10 NOTE — Progress Notes (Signed)
eLink Physician-Brief Progress Note Patient Name: [name] DOB: [birth year] MRN: [id number]   Date of Service  11/10/2019  HPI/Events of Note  Patient now NPO due to tenuous respiratory status. Has due Lantus 32 units tonight and Toprol XL 25. SBP 100s and HR 80s. Patient appears restless  eICU Interventions   Will be needing to resume low dose Precedex for tonight so will hold off on beta blocker  Decrease Lantus to 8 units tonight and reassess if additional doses needed     Intervention Category Major Interventions: Hyperglycemia - active titration of insulin therapy Intermediate Interventions: Best-practice therapies (e.g. DVT, beta blocker, etc.)  Irving Burton T Masha Orbach 11/10/2019, 10:24 PM

## 2019-11-10 NOTE — Progress Notes (Addendum)
Dr. Su Hoff camera'd into room, will hold po medications to include metoprolol tonight.  Pt sats 96% on Hiflo and NRB, MD asked to remove NRB at this time to see how o2 sats will hold on Hiflo 100%, 50L only. MD notified that patient restless, confused, growing agitation, per MD ok to slowly restart precedex. Address lantus insulin as well, MD to adjust dosage.  Also Will continue to monitor.

## 2019-11-10 NOTE — Progress Notes (Signed)
elink called to clarify which medications should be held tonight.  Pt high risk for intubation, no po meds appear to have been given today.  Await MD response.

## 2019-11-10 NOTE — Progress Notes (Signed)
Patient removing NRB mask, when RN attempted to replace the mask the patient grabbed the NRB despite the soft wrist restraints, yelled "You're some (expletive) dirty cops." and "what kind of (expletive) coffee shop is this." Not able to reorient patient. Pt was able to take off safety mitts while in soft wrist restraints and remove NRB multiple times with 2 RNs at bedside. Patient is thrashing, hitting, and kicking at RNs.

## 2019-11-10 NOTE — Progress Notes (Signed)
Upon returning to pt room after 30 minutes, the patient was able to take off his NRB mask and HFNC while in soft wrist restraints, O2 sats 68. RN placed pt back on NRB and HFNC, sats 85%. MD notified

## 2019-11-10 NOTE — Progress Notes (Signed)
Patient o2 sats 78-80% on 100% HiFlo 15L, and NRB mask, RR 40s, accessory muscles in use, deep respirations.  Pt obtunded, sternal rub only elicits moaning from patient.  Gretchin, RN in elink notified of patient status.  Await MD arrival at bedside, room prepped for intubation.   RT at bedside and setting up ventilator.

## 2019-11-10 NOTE — Progress Notes (Signed)
UPdated pt wife

## 2019-11-10 NOTE — Progress Notes (Signed)
Pt more alert, arouses to verbal stimuli, oriented to self only at this time, however follows simple commands and reports verbally that he is not in pain and that his breathing feels "ok".  Respirations 20-30s at this time and deep, however sats 88-95% on heated hiFLo 100% 50L and NRB.  Dr. Thora Lance to reassess patient a little later.  Holding off on intubation at this time.
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
## 2019-11-11 ENCOUNTER — Inpatient Hospital Stay (HOSPITAL_COMMUNITY): Payer: Medicare Other

## 2019-11-11 DIAGNOSIS — G9340 Encephalopathy, unspecified: Secondary | ICD-10-CM

## 2019-11-11 DIAGNOSIS — E119 Type 2 diabetes mellitus without complications: Secondary | ICD-10-CM | POA: Diagnosis not present

## 2019-11-11 DIAGNOSIS — U071 COVID-19: Secondary | ICD-10-CM | POA: Diagnosis not present

## 2019-11-11 DIAGNOSIS — J9601 Acute respiratory failure with hypoxia: Secondary | ICD-10-CM | POA: Diagnosis not present

## 2019-11-11 DIAGNOSIS — I10 Essential (primary) hypertension: Secondary | ICD-10-CM | POA: Diagnosis not present

## 2019-11-11 LAB — POCT I-STAT 7, (LYTES, BLD GAS, ICA,H+H)
Acid-base deficit: 1 mmol/L (ref 0.0–2.0)
Bicarbonate: 25 mmol/L (ref 20.0–28.0)
Bicarbonate: 26 mmol/L (ref 20.0–28.0)
Calcium, Ion: 1.24 mmol/L (ref 1.15–1.40)
Calcium, Ion: 1.25 mmol/L (ref 1.15–1.40)
HCT: 39 % (ref 39.0–52.0)
HCT: 38 % — ABNORMAL LOW (ref 39.0–52.0)
Hemoglobin: 13.3 g/dL (ref 13.0–17.0)
Hemoglobin: 12.9 g/dL — ABNORMAL LOW (ref 13.0–17.0)
O2 Saturation: 90 %
O2 Saturation: 94 %
Patient temperature: 98.4
Patient temperature: 97.9
Potassium: 4.6 mmol/L (ref 3.5–5.1)
Potassium: 5.4 mmol/L — ABNORMAL HIGH (ref 3.5–5.1)
Sodium: 144 mmol/L (ref 135–145)
Sodium: 145 mmol/L (ref 135–145)
TCO2: 26 mmol/L (ref 22–32)
TCO2: 28 mmol/L (ref 22–32)
pCO2 arterial: 43.1 mmHg (ref 32.0–48.0)
pCO2 arterial: 52.3 mmHg — ABNORMAL HIGH (ref 32.0–48.0)
pH, Arterial: 7.372 (ref 7.350–7.450)
pH, Arterial: 7.302 — ABNORMAL LOW (ref 7.350–7.450)
pO2, Arterial: 61 mmHg — ABNORMAL LOW (ref 83.0–108.0)
pO2, Arterial: 76 mmHg — ABNORMAL LOW (ref 83.0–108.0)

## 2019-11-11 LAB — CBC WITH DIFFERENTIAL/PLATELET
Abs Immature Granulocytes: 0.07 10*3/uL (ref 0.00–0.07)
Basophils Absolute: 0 10*3/uL (ref 0.0–0.1)
Basophils Relative: 0 %
Eosinophils Absolute: 0 10*3/uL (ref 0.0–0.5)
Eosinophils Relative: 0 %
HCT: 45.2 % (ref 39.0–52.0)
Hemoglobin: 15.4 g/dL (ref 13.0–17.0)
Immature Granulocytes: 1 %
Lymphocytes Relative: 4 %
Lymphs Abs: 0.5 10*3/uL — ABNORMAL LOW (ref 0.7–4.0)
MCH: 29.2 pg (ref 26.0–34.0)
MCHC: 34.1 g/dL (ref 30.0–36.0)
MCV: 85.8 fL (ref 80.0–100.0)
Monocytes Absolute: 0.2 10*3/uL (ref 0.1–1.0)
Monocytes Relative: 2 %
Neutro Abs: 10.2 10*3/uL — ABNORMAL HIGH (ref 1.7–7.7)
Neutrophils Relative %: 93 %
Platelets: 334 10*3/uL (ref 150–400)
RBC: 5.27 MIL/uL (ref 4.22–5.81)
RDW: 12.6 % (ref 11.5–15.5)
WBC: 10.9 10*3/uL — ABNORMAL HIGH (ref 4.0–10.5)
nRBC: 0 % (ref 0.0–0.2)

## 2019-11-11 LAB — GLUCOSE, CAPILLARY
Glucose-Capillary: 435 mg/dL — ABNORMAL HIGH (ref 70–99)
Glucose-Capillary: 214 mg/dL — ABNORMAL HIGH (ref 70–99)
Glucose-Capillary: 215 mg/dL — ABNORMAL HIGH (ref 70–99)
Glucose-Capillary: 232 mg/dL — ABNORMAL HIGH (ref 70–99)
Glucose-Capillary: 148 mg/dL — ABNORMAL HIGH (ref 70–99)
Glucose-Capillary: 218 mg/dL — ABNORMAL HIGH (ref 70–99)

## 2019-11-11 LAB — COMPREHENSIVE METABOLIC PANEL
ALT: 27 U/L (ref 0–44)
AST: 68 U/L — ABNORMAL HIGH (ref 15–41)
Albumin: 2.4 g/dL — ABNORMAL LOW (ref 3.5–5.0)
Alkaline Phosphatase: 73 U/L (ref 38–126)
Anion gap: 16 — ABNORMAL HIGH (ref 5–15)
BUN: 44 mg/dL — ABNORMAL HIGH (ref 8–23)
CO2: 22 mmol/L (ref 22–32)
Calcium: 8.9 mg/dL (ref 8.9–10.3)
Chloride: 104 mmol/L (ref 98–111)
Creatinine, Ser: 1.13 mg/dL (ref 0.61–1.24)
GFR calc Af Amer: >60 mL/min (ref >60)
GFR calc non Af Amer: >60 mL/min (ref >60)
Glucose, Bld: 238 mg/dL — ABNORMAL HIGH (ref 70–99)
Potassium: 4.5 mmol/L (ref 3.5–5.1)
Sodium: 142 mmol/L (ref 135–145)
Total Bilirubin: 0.6 mg/dL (ref 0.3–1.2)
Total Protein: 6.5 g/dL (ref 6.5–8.1)

## 2019-11-11 LAB — LACTIC ACID, PLASMA
Lactic Acid, Venous: 4.2 mmol/L (ref 0.5–1.9)
Lactic Acid, Venous: 1.9 mmol/L (ref 0.5–1.9)

## 2019-11-11 LAB — D-DIMER, QUANTITATIVE: D-Dimer, Quant: 1.13 ug/mL-FEU — ABNORMAL HIGH (ref 0.00–0.50)

## 2019-11-11 LAB — C-REACTIVE PROTEIN: CRP: 10 mg/dL — ABNORMAL HIGH (ref <1.0)

## 2019-11-11 MED ORDER — MIDAZOLAM HCL 2 MG/2ML IJ SOLN
INTRAMUSCULAR | Status: AC
  Filled 2019-11-11: qty 2

## 2019-11-11 MED ORDER — ASPIRIN 81 MG PO CHEW
81.0000 mg | CHEWABLE_TABLET | Freq: Every day | ORAL | Status: DC
  Administered 2019-11-12 – 2020-01-07 (×55): 81 mg
  Filled 2019-11-11 (×56): qty 1

## 2019-11-11 MED ORDER — ETOMIDATE 2 MG/ML IV SOLN
INTRAVENOUS | Status: AC
  Filled 2019-11-11: qty 10

## 2019-11-11 MED ORDER — ORAL CARE MOUTH RINSE
15.0000 mL | OROMUCOSAL | Status: DC
  Administered 2019-11-11 – 2019-12-29 (×472): 15 mL via OROMUCOSAL

## 2019-11-11 MED ORDER — ROCURONIUM BROMIDE 50 MG/5ML IV SOLN
50.0000 mg | Freq: Once | INTRAVENOUS | Status: AC
  Filled 2019-11-11: qty 5

## 2019-11-11 MED ORDER — FENTANYL BOLUS VIA INFUSION
50.0000 ug | INTRAVENOUS | Status: DC | PRN
  Administered 2019-11-14 – 2019-11-16 (×9): 50 ug via INTRAVENOUS
  Filled 2019-11-11: qty 50

## 2019-11-11 MED ORDER — FENTANYL CITRATE (PF) 100 MCG/2ML IJ SOLN: 100.0000 ug | Freq: Once | INTRAMUSCULAR | Status: AC

## 2019-11-11 MED ORDER — MIDAZOLAM HCL 2 MG/2ML IJ SOLN: 2.0000 mg | Freq: Once | INTRAMUSCULAR | Status: AC

## 2019-11-11 MED ORDER — ETOMIDATE 2 MG/ML IV SOLN
INTRAVENOUS | Status: AC
  Administered 2019-11-11: 20 mg via INTRAVENOUS
  Filled 2019-11-11: qty 10

## 2019-11-11 MED ORDER — ROCURONIUM BROMIDE 10 MG/ML (PF) SYRINGE
PREFILLED_SYRINGE | INTRAVENOUS | Status: AC
  Filled 2019-11-11: qty 10

## 2019-11-11 MED ORDER — INSULIN ASPART 100 UNIT/ML ~~LOC~~ SOLN: 8.0000 [IU] | Freq: Three times a day (TID) | SUBCUTANEOUS | Status: DC

## 2019-11-11 MED ORDER — MAGNESIUM OXIDE 400 (241.3 MG) MG PO TABS
800.0000 mg | ORAL_TABLET | Freq: Every day | ORAL | Status: DC
  Administered 2019-11-11 – 2019-11-20 (×10): 800 mg
  Filled 2019-11-11 (×10): qty 2

## 2019-11-11 MED ORDER — LORATADINE 10 MG PO TABS
10.0000 mg | ORAL_TABLET | Freq: Every day | ORAL | Status: DC
  Administered 2019-11-11 – 2019-12-11 (×30): 10 mg
  Filled 2019-11-11 (×32): qty 1

## 2019-11-11 MED ORDER — MIDAZOLAM HCL 2 MG/2ML IJ SOLN
2.0000 mg | Freq: Once | INTRAMUSCULAR | Status: AC
  Administered 2019-11-11: 2 mg via INTRAVENOUS

## 2019-11-11 MED ORDER — ROCURONIUM BROMIDE 10 MG/ML (PF) SYRINGE
PREFILLED_SYRINGE | INTRAVENOUS | Status: AC
  Administered 2019-11-11: 50 mg
  Filled 2019-11-11: qty 10

## 2019-11-11 MED ORDER — VECURONIUM BROMIDE 10 MG IV SOLR
0.1000 mg/kg | INTRAVENOUS | Status: DC | PRN
  Administered 2019-11-11 – 2019-11-17 (×8): 9 mg via INTRAVENOUS
  Filled 2019-11-11 (×8): qty 10

## 2019-11-11 MED ORDER — MIDAZOLAM HCL 2 MG/2ML IJ SOLN
2.0000 mg | INTRAMUSCULAR | Status: DC | PRN
  Administered 2019-11-17 (×2): 2 mg via INTRAVENOUS
  Filled 2019-11-11 (×3): qty 2

## 2019-11-11 MED ORDER — SIMVASTATIN 20 MG PO TABS
40.0000 mg | ORAL_TABLET | Freq: Every day | ORAL | Status: DC
  Administered 2019-11-11 – 2020-01-06 (×55): 40 mg
  Filled 2019-11-11 (×61): qty 2

## 2019-11-11 MED ORDER — GABAPENTIN 250 MG/5ML PO SOLN
900.0000 mg | Freq: Every day | ORAL | Status: DC
  Administered 2019-11-11 – 2019-12-12 (×32): 900 mg
  Filled 2019-11-11 (×36): qty 18

## 2019-11-11 MED ORDER — DOCUSATE SODIUM 50 MG/5ML PO LIQD
100.0000 mg | Freq: Two times a day (BID) | ORAL | Status: DC | PRN
  Administered 2019-11-17 – 2019-12-05 (×3): 100 mg
  Filled 2019-11-11 (×4): qty 10

## 2019-11-11 MED ORDER — MIDAZOLAM HCL 2 MG/2ML IJ SOLN
INTRAMUSCULAR | Status: AC
  Administered 2019-11-11: 2 mg via INTRAVENOUS
  Filled 2019-11-11: qty 4

## 2019-11-11 MED ORDER — EZETIMIBE 10 MG PO TABS
10.0000 mg | ORAL_TABLET | Freq: Every day | ORAL | Status: DC
  Administered 2019-11-11 – 2019-11-29 (×19): 10 mg
  Filled 2019-11-11 (×22): qty 1

## 2019-11-11 MED ORDER — HALOPERIDOL LACTATE 5 MG/ML IJ SOLN
5.0000 mg | INTRAMUSCULAR | Status: AC | PRN
  Administered 2019-11-11 (×2): 5 mg via INTRAVENOUS
  Filled 2019-11-11 (×2): qty 1

## 2019-11-11 MED ORDER — LINAGLIPTIN 5 MG PO TABS
5.0000 mg | ORAL_TABLET | Freq: Every day | ORAL | Status: DC
  Administered 2019-11-12 – 2020-01-07 (×56): 5 mg
  Filled 2019-11-11 (×59): qty 1

## 2019-11-11 MED ORDER — ARTIFICIAL TEARS OPHTHALMIC OINT
1.0000 "application " | TOPICAL_OINTMENT | Freq: Three times a day (TID) | OPHTHALMIC | Status: DC
  Administered 2019-11-11 – 2019-11-21 (×28): 1 via OPHTHALMIC
  Filled 2019-11-11 (×8): qty 3.5

## 2019-11-11 MED ORDER — TRAZODONE HCL 150 MG PO TABS
150.0000 mg | ORAL_TABLET | Freq: Every day | ORAL | Status: DC
  Administered 2019-11-11 – 2019-11-15 (×5): 150 mg
  Filled 2019-11-11 (×5): qty 1

## 2019-11-11 MED ORDER — MIDAZOLAM HCL 2 MG/2ML IJ SOLN
INTRAMUSCULAR | Status: AC
  Administered 2019-11-11: 2 mg
  Filled 2019-11-11: qty 4

## 2019-11-11 MED ORDER — ALBUTEROL SULFATE (2.5 MG/3ML) 0.083% IN NEBU: 2.5000 mg | INHALATION_SOLUTION | Freq: Four times a day (QID) | RESPIRATORY_TRACT | Status: DC

## 2019-11-11 MED ORDER — ETOMIDATE 2 MG/ML IV SOLN: 20.0000 mg | Freq: Once | INTRAVENOUS | Status: AC

## 2019-11-11 MED ORDER — DEXTROSE 10 % IV SOLN: INTRAVENOUS | Status: DC

## 2019-11-11 MED ORDER — ACETAMINOPHEN 160 MG/5ML PO SOLN
650.0000 mg | Freq: Four times a day (QID) | ORAL | Status: DC | PRN
  Administered 2019-11-21 – 2020-01-07 (×14): 650 mg
  Filled 2019-11-11 (×13): qty 20.3

## 2019-11-11 MED ORDER — PANTOPRAZOLE SODIUM 40 MG PO PACK
40.0000 mg | PACK | Freq: Every day | ORAL | Status: DC
  Administered 2019-11-12 – 2020-01-07 (×56): 40 mg
  Filled 2019-11-11 (×57): qty 20

## 2019-11-11 MED ORDER — INSULIN GLARGINE 100 UNIT/ML ~~LOC~~ SOLN
25.0000 [IU] | Freq: Two times a day (BID) | SUBCUTANEOUS | Status: DC
  Administered 2019-11-11 – 2019-11-12 (×3): 25 [IU] via SUBCUTANEOUS
  Filled 2019-11-11 (×4): qty 0.25

## 2019-11-11 MED ORDER — FENTANYL CITRATE (PF) 100 MCG/2ML IJ SOLN
INTRAMUSCULAR | Status: AC
  Administered 2019-11-11: 100 ug via INTRAVENOUS
  Filled 2019-11-11: qty 2

## 2019-11-11 MED ORDER — ETOMIDATE 2 MG/ML IV SOLN
INTRAVENOUS | Status: AC
  Administered 2019-11-11: 20 mg
  Filled 2019-11-11: qty 20

## 2019-11-11 MED ORDER — CHLORHEXIDINE GLUCONATE 0.12% ORAL RINSE (MEDLINE KIT)
15.0000 mL | Freq: Two times a day (BID) | OROMUCOSAL | Status: DC
  Administered 2019-11-11 – 2019-12-29 (×98): 15 mL via OROMUCOSAL

## 2019-11-11 MED ORDER — METOPROLOL TARTRATE 25 MG/10 ML ORAL SUSPENSION
12.5000 mg | Freq: Two times a day (BID) | ORAL | Status: DC
  Administered 2019-11-12 – 2019-11-29 (×29): 12.5 mg
  Filled 2019-11-11 (×34): qty 10

## 2019-11-11 MED ORDER — CLONAZEPAM 0.5 MG PO TBDP: 0.5000 mg | ORAL_TABLET | Freq: Three times a day (TID) | ORAL | Status: DC | PRN

## 2019-11-11 MED ORDER — CYCLOBENZAPRINE HCL 5 MG PO TABS
5.0000 mg | ORAL_TABLET | Freq: Three times a day (TID) | ORAL | Status: DC
  Administered 2019-11-11 – 2019-11-13 (×5): 5 mg
  Filled 2019-11-11 (×5): qty 1

## 2019-11-11 MED ORDER — METHYLPREDNISOLONE SODIUM SUCC 40 MG IJ SOLR
30.0000 mg | Freq: Two times a day (BID) | INTRAMUSCULAR | Status: DC
  Administered 2019-11-11 – 2019-11-20 (×18): 30 mg via INTRAVENOUS
  Filled 2019-11-11 (×18): qty 1

## 2019-11-11 MED ORDER — PROPOFOL 1000 MG/100ML IV EMUL
0.0000 ug/kg/min | INTRAVENOUS | Status: DC
  Administered 2019-11-11 (×2): 10 ug/kg/min via INTRAVENOUS
  Administered 2019-11-11: 5 ug/kg/min via INTRAVENOUS
  Administered 2019-11-12: 20 ug/kg/min via INTRAVENOUS
  Administered 2019-11-12 (×2): 30 ug/kg/min via INTRAVENOUS
  Administered 2019-11-13: 20:00:00 25 ug/kg/min via INTRAVENOUS
  Administered 2019-11-13 (×3): 30 ug/kg/min via INTRAVENOUS
  Filled 2019-11-11 (×9): qty 100

## 2019-11-11 MED ORDER — INSULIN ASPART 100 UNIT/ML ~~LOC~~ SOLN
0.0000 [IU] | SUBCUTANEOUS | Status: DC
  Administered 2019-11-11: 3 [IU] via SUBCUTANEOUS
  Administered 2019-11-12 (×6): 7 [IU] via SUBCUTANEOUS
  Administered 2019-11-12: 20:00:00 4 [IU] via SUBCUTANEOUS
  Administered 2019-11-13: 11 [IU] via SUBCUTANEOUS
  Administered 2019-11-13 (×3): 7 [IU] via SUBCUTANEOUS

## 2019-11-11 MED ORDER — MIDAZOLAM HCL 2 MG/2ML IJ SOLN
2.0000 mg | INTRAMUSCULAR | Status: AC | PRN
  Administered 2019-11-14 – 2019-11-16 (×3): 2 mg via INTRAVENOUS
  Filled 2019-11-11 (×2): qty 2

## 2019-11-11 MED ORDER — FENTANYL 2500MCG IN NS 250ML (10MCG/ML) PREMIX INFUSION
0.0000 ug/h | INTRAVENOUS | Status: DC
  Administered 2019-11-11: 200 ug/h via INTRAVENOUS
  Administered 2019-11-11: 50 ug/h via INTRAVENOUS
  Administered 2019-11-12 – 2019-11-13 (×3): 200 ug/h via INTRAVENOUS
  Administered 2019-11-14 (×2): 150 ug/h via INTRAVENOUS
  Administered 2019-11-15: 50 ug/h via INTRAVENOUS
  Administered 2019-11-16: 150 ug/h via INTRAVENOUS
  Administered 2019-11-17 – 2019-11-19 (×4): 200 ug/h via INTRAVENOUS
  Administered 2019-11-19: 125 ug/h via INTRAVENOUS
  Administered 2019-11-20: 175 ug/h via INTRAVENOUS
  Filled 2019-11-11 (×15): qty 250

## 2019-11-11 MED ORDER — IPRATROPIUM-ALBUTEROL 0.5-2.5 (3) MG/3ML IN SOLN
3.0000 mL | Freq: Four times a day (QID) | RESPIRATORY_TRACT | Status: DC
  Administered 2019-11-11 – 2019-11-26 (×54): 3 mL via RESPIRATORY_TRACT
  Filled 2019-11-11: qty 3
  Filled 2019-11-11: qty 6
  Filled 2019-11-11 (×22): qty 3
  Filled 2019-11-11: qty 30
  Filled 2019-11-11 (×27): qty 3

## 2019-11-11 MED ORDER — FENTANYL CITRATE (PF) 100 MCG/2ML IJ SOLN
50.0000 ug | Freq: Once | INTRAMUSCULAR | Status: AC
  Administered 2019-11-11: 50 ug via INTRAVENOUS

## 2019-11-11 MED ORDER — LORAZEPAM 2 MG/ML IJ SOLN
1.0000 mg | INTRAMUSCULAR | Status: DC | PRN
  Filled 2019-11-11: qty 1

## 2019-11-11 NOTE — Progress Notes (Signed)
desats 80s, NRB reapplied for sat gal >90%.

## 2019-11-11 NOTE — Progress Notes (Addendum)
PROGRESS NOTE                                                                                                                                                                                                             Patient Demographics:    [name], is a 64 y.o. male, DOB - [birth year], GQQ:[id number]  Outpatient Primary MD for the patient is Lorre Munroe, NP   Admit date - 11-24-19   LOS - 3  Chief Complaint  Patient presents with  . Respiratory Distress  . Covid Exposure       Brief Narrative: Patient is a 64 y.o. male with PMHx of CAD s/p PCI/CABG, bronchial asthma, DM-2, LD, HTN-presenting with a 2-week history of URI symptoms-cough-with gradually worsening shortness of breath.  Found to have COVID-19 pneumonia with hypoxemia and admitted to the hospitalist service.  Post admission-developed worsening hypoxemia requiring initiation of high flow oxygen.  See below for further details.  Significant Events: 4/2>> admit to [facility] for  hypoxemia 4/3>> worsening hypoxemia-on 15 L HFNC and NRB 4/3>>Lower ext Doppler neg for DVT 4/4>>worsening confusion-pulling NRB-transfer to ICU  COVID-19 medications: Steroids: 4/2>> Remdesivir: 4/2>> Actemra: 4/2x1  Antibiotics: Rocephin: 4/2>> Zithromax: 4/3>>4/5  Microbiology data: 4/2: Blood cultures>> neg  DVT prophylaxis: SQ Lovenox (intermediate dosing)  Procedures: None  Consults: PCCM   Subjective:   Much more awake and alert compared to yesterday.  Able to answer most of my questions appropriately.  Still in two-point restraints.  Continues to want to take the NRB out-in spite of reorientation.   Assessment  & Plan :   Acute Hypoxic Resp Failure due to Covid 19 Viral pneumonia and possible concurrent bacterial pneumonia: Remains severely hypoxemic-no improvement in hypoxia but appears comfortable.  Speaking in full sentences-not using accessory muscles.   Continue Solu-Medrol but stable slightly today-remains on remdesivir/Rocephin and Zithromax.  Due to waxing and waning encephalopathy-need for close nursing supervision (keeps on pulling out NRB)-remain in the ICU till he is a bit more stable for PCU.  Fever: afebrile  O2 requirements:  SpO2: 92 % O2 Flow Rate (L/min): 50 L/min(plus 15L NRB) FiO2 (%): 100 %   COVID-19 Labs: Recent Labs    Nov 24, 2019 1251 11/24/2019 2237 11/09/19 0336 11/10/19 0631 11/11/19 0326  DDIMER 1.02*   < > 1.67* 1.01* 1.13*  FERRITIN 1,172*  --   --   --   --  LDH 480*  --   --   --   --   CRP 33.8*  --  35.0* 18.8* 10.0*   < > = values in this interval not displayed.       Component Value Date/Time   BNP 55.9 11/09/2019 0339    Recent Labs  Lab 11/25/2019 1251 11/09/19 0336  PROCALCITON 1.44 3.26    Lab Results  Component Value Date   SARSCOV2NAA POSITIVE (A) 11/18/2019    Prone/Incentive Spirometry: encouraged patient to lie prone for 3-4 hours at a time for a total of 16 hours a day, and to encourage incentive spirometry use 3-4/hour.  Acute metabolic encephalopathy: Improved-but not yet at baseline.  Suspect etiology multifactorial from COVID-19 infection with hypoxemia-use of Haldol/benzodiazepines/steroids.  Continue supportive care-remains on low-dose Precedex infusion-minimize benzos as much as possible.  Bronchial asthma with mild exacerbation: Exacerbation has resolved-no wheezing heard-remains on steroids and bronchodilators.    AKI: Likely hemodynamically mediated-improving.  Follow.  DM-2 (A1c 7.51/2) with uncontrolled hyperglycemia secondary to steroids: CBGs-better controlled-due to encephalopathy-oral intake erratic-we will decrease Lantus to 25 units twice daily, Premeal NovoLog to 8 units-continue SSI.  Follow and adjust.    CBG (last 3)  Recent Labs    11/10/19 2029 11/10/19 2314 11/11/19 0731  GLUCAP 170* 193* 214*    HLD: Continue statins/Zetia  CAD: History of  CABG-no anginal symptoms-continue aspirin, statin  GERD: Continue PPI  GI prophylaxis: PPI  ABG:    Component Value Date/Time   PHART 7.446 11/10/2019 2051   PCO2ART 38.5 11/10/2019 2051   PO2ART 40.0 (LL) 11/10/2019 2051   HCO3 26.5 11/10/2019 2051   TCO2 28 11/10/2019 2051   O2SAT 77.0 11/10/2019 2051    Vent Settings:  FiO2 (%):  [100 %] 100 %  Condition - Extremely Guarded-very tenuous with risk for further deterioration  Family Communication  : Spouse updated over the phone 4/5  Code Status :  Full Code  Diet :  Diet Order            Diet heart healthy/carb modified Room service appropriate? Yes; Fluid consistency: Thin  Diet effective now               Disposition Plan  :  Remain hospitalized Paolucci need to be transferred to the ICU if confusion/encephalopathy persists.  Barriers to discharge: Hypoxia requiring O2 supplementation/complete 5 days of IV Remdesivir  Antimicorbials  :    Anti-infectives (From admission, onward)   Start     Dose/Rate Route Frequency Ordered Stop   11/09/19 2000  cefTRIAXone (ROCEPHIN) 2 g in sodium chloride 0.9 % 100 mL IVPB     2 g 200 mL/hr over 30 Minutes Intravenous Every 24 hours 11/09/19 0727 11/13/19 1959   11/09/19 1000  remdesivir 100 mg in sodium chloride 0.9 % 100 mL IVPB     100 mg 200 mL/hr over 30 Minutes Intravenous Daily 11/13/2019 1853 11/13/19 0959   11/09/19 0800  azithromycin (ZITHROMAX) 500 mg in sodium chloride 0.9 % 250 mL IVPB     500 mg 250 mL/hr over 60 Minutes Intravenous Every 24 hours 11/09/19 0727 11/12/19 0759   12/01/2019 1945  cefTRIAXone (ROCEPHIN) 2 g in sodium chloride 0.9 % 100 mL IVPB  Status:  Discontinued     2 g 200 mL/hr over 30 Minutes Intravenous Every 24 hours 11/12/2019 1940 11/09/19 0727   11/15/2019 1930  remdesivir 200 mg in sodium chloride 0.9% 250 mL IVPB     200  mg 580 mL/hr over 30 Minutes Intravenous Once 12/06/2019 1853 December 06, 2019 2204      Inpatient Medications  Scheduled  Meds: . albuterol  4 puff Inhalation Q6H  . aspirin EC  81 mg Oral QHS  . Chlorhexidine Gluconate Cloth  6 each Topical Daily  . cyclobenzaprine  5 mg Oral TID  . enoxaparin (LOVENOX) injection  40 mg Subcutaneous Q12H  . ezetimibe  10 mg Oral QHS  . fentaNYL (SUBLIMAZE) injection  100 mcg Intravenous Once  . fluticasone  2 spray Each Nare QHS  . gabapentin  900 mg Oral QHS  . insulin aspart  0-20 Units Subcutaneous TID WC  . insulin aspart  12 Units Subcutaneous TID WC  . insulin glargine  32 Units Subcutaneous BID  . ketamine (KETALAR) injection 10mg /mL (IV use)  180 mg Intravenous Once  . linagliptin  5 mg Oral Daily  . loratadine  10 mg Oral QHS  . magnesium oxide  800 mg Oral QHS  . methylPREDNISolone (SOLU-MEDROL) injection  40 mg Intravenous Q12H  . metoprolol succinate  25 mg Oral QHS  . oxymetazoline  2 spray Each Nare BID  . pantoprazole  40 mg Oral Daily  . rocuronium  100 mg Intravenous Once  . simvastatin  40 mg Oral QHS  . traZODone  150 mg Oral QHS   Continuous Infusions: . azithromycin 500 mg (11/10/19 0802)  . cefTRIAXone (ROCEPHIN)  IV 2 g (11/10/19 2114)  . dexmedetomidine (PRECEDEX) IV infusion 0.2 mcg/kg/hr (11/10/19 2224)  . remdesivir 100 mg in NS 100 mL 100 mg (11/10/19 0948)   PRN Meds:.acetaminophen, clonazePAM, LORazepam, nitroGLYCERIN, ziprasidone   Time Spent in minutes  45  The patient is critically ill with multiple organ system failure and requires high complexity decision making for assessment and support, frequent evaluation and titration of therapies, advanced monitoring, review of radiographic studies and interpretation of complex data.   See all Orders from today for further details   01/10/20 M.D on 11/11/2019 at 9:27 AM  To page go to [email or web address] - use universal password  [facility] -  Office  [phone]    Objective:   Vitals:   11/11/19 0700 11/11/19 0800 11/11/19 0809 11/11/19 0900  BP: 139/81 128/90 128/90  136/84  Pulse: (!) 113 (!) 116 (!) 115 (!) 112  Resp: (!) 32 (!) 35 (!) 33 (!) 31  Temp:  98.3 F (36.8 C)    TempSrc:  Oral    SpO2: (!) 84% (!) 85% 90% 92%  Weight:      Height:        Wt Readings from Last 3 Encounters:  2019-12-06 90.3 kg  05/08/19 90.3 kg  04/30/19 90.3 kg     Intake/Output Summary (Last 24 hours) at 11/11/2019 0927 Last data filed at 11/11/2019 0500 Gross per 24 hour  Intake 147.43 ml  Output 1125 ml  Net -977.57 ml     Physical Exam Gen Exam:More Alert awake-following simple questions.  Still in restraints-attempt to take out NRB. HEENT:atraumatic, normocephalic Chest: B/L clear to auscultation anteriorly CVS:S1S2 regular Abdomen:soft non tender, non distended Extremities:no edema Neurology: Non focal Skin: no rash   Data Review:    CBC Recent Labs  Lab 12/06/19 1251 11/09/19 0336 11/10/19 0631 11/10/19 2051 11/11/19 0326  WBC 15.5* 8.1 15.8*  --  10.9*  HGB 17.4* 15.5 15.6 14.6 15.4  HCT 51.9 45.3 46.1 43.0 45.2  PLT 317 266 327  --  334  MCV 87.7 84.2 85.8  --  85.8  MCH 29.4 28.8 29.1  --  29.2  MCHC 33.5 34.2 33.8  --  34.1  RDW 12.3 12.1 12.3  --  12.6  LYMPHSABS 0.8 0.6* 0.7  --  0.5*  MONOABS 0.8 0.2 0.4  --  0.2  EOSABS 0.0 0.0 0.0  --  0.0  BASOSABS 0.1 0.0 0.0  --  0.0    Chemistries  Recent Labs  Lab 11/16/2019 1251 11/09/19 0336 11/10/19 0631 11/10/19 2051 11/11/19 0326  NA 132* 132* 137 141 142  K 5.4* 4.2 4.2 4.5 4.5  CL 90* 93* 97*  --  104  CO2 21* 25 24  --  22  GLUCOSE 344* 357* 348*  --  238*  BUN 40* 34* 40*  --  44*  CREATININE 1.51* 1.07 1.15  --  1.13  CALCIUM 9.1 8.6* 9.0  --  8.9  AST 43* 43* 55*  --  68*  ALT 21 23 27   --  27  ALKPHOS 72 63 78  --  73  BILITOT 1.0 0.6 0.8  --  0.6   ------------------------------------------------------------------------------------------------------------------ Recent Labs    11/21/2019 1251  TRIG 182*    Lab Results  Component Value Date   HGBA1C 7.5  (H) 11/13/2019   ------------------------------------------------------------------------------------------------------------------ No results for input(s): TSH, T4TOTAL, T3FREE, THYROIDAB in the last 72 hours.  Invalid input(s): FREET3 ------------------------------------------------------------------------------------------------------------------ Recent Labs    11/10/2019 1251  FERRITIN 1,172*    Coagulation profile No results for input(s): INR, PROTIME in the last 168 hours.  Recent Labs    11/10/19 0631 11/11/19 0326  DDIMER 1.01* 1.13*    Cardiac Enzymes No results for input(s): CKMB, TROPONINI, MYOGLOBIN in the last 168 hours.  Invalid input(s): CK ------------------------------------------------------------------------------------------------------------------    Component Value Date/Time   BNP 55.9 11/09/2019 8101    Micro Results Recent Results (from the past 240 hour(s))  Blood Culture (routine x 2)     Status: None (Preliminary result)   Collection Time: 11/07/2019 12:51 PM   Specimen: BLOOD  Result Value Ref Range Status   Specimen Description BLOOD LEFT ANTECUBITAL  Final   Special Requests   Final    BOTTLES DRAWN AEROBIC AND ANAEROBIC Blood Culture results [place] not be optimal due to an inadequate volume of blood received in culture bottles   Culture   Final    NO GROWTH 2 DAYS Performed at [facility], [place] [address], [place], [place] [address]    Report Status PENDING  Incomplete  SARS CORONAVIRUS 2 (TAT 6-24 HRS) Nasopharyngeal Nasopharyngeal Swab     Status: Abnormal   Collection Time: 11/21/2019  1:08 PM   Specimen: Nasopharyngeal Swab  Result Value Ref Range Status   SARS Coronavirus 2 POSITIVE (A) NEGATIVE Final    Comment: RESULT CALLED TO, READ BACK BY AND VERIFIED WITH: W.PAYAN RN [id number] 11/19/2019 MCCORMICK K (NOTE) SARS-CoV-2 target nucleic acids are DETECTED. The SARS-CoV-2 RNA is generally detectable in upper and lower respiratory  specimens during the acute phase of infection. Positive results are indicative of the presence of SARS-CoV-2 RNA. Clinical correlation with patient history and other diagnostic information is  necessary to determine patient infection status. Positive results do not rule out bacterial infection or co-infection with other viruses.  The expected result is Negative. Fact Sheet for Patients: SugarRoll.be Fact Sheet for Healthcare Providers: https://www.woods-mathews.com/ This test is not yet approved or cleared by the [place] FDA and  has been authorized for detection and/or diagnosis of SARS-CoV-2  by FDA under an Emergency Use Authorization (EUA). This EUA will remain  in effect (meaning this test can be used) for th e duration of the COVID-19 declaration under Section 564(b)(1) of the Act, 21 U.S.C. section 360bbb-3(b)(1), unless the authorization is terminated or revoked sooner. Performed at [facility], [address], [place], [place] [address]   Blood Culture (routine x 2)     Status: None (Preliminary result)   Collection Time: 11/13/2019  2:53 PM   Specimen: BLOOD  Result Value Ref Range Status   Specimen Description BLOOD RIGHT ANTECUBITAL  Final   Special Requests   Final    BOTTLES DRAWN AEROBIC AND ANAEROBIC Blood Culture adequate volume   Culture   Final    NO GROWTH 2 DAYS Performed at [facility], [address] [place], [place] [address]    Report Status PENDING  Incomplete    Radiology Reports DG Chest Port 1 View  Result Date: 11/10/2019 CLINICAL DATA:  COVID positive on 11/07/2019. Confused. Respiratory distress. EXAM: PORTABLE CHEST 1 VIEW COMPARISON:  11/09/2019 and earlier exams. FINDINGS: Interstitial and subtle hazy airspace lung opacities in the mid to lower lungs is without change from the previous exam. No new lung abnormalities. No convincing pleural effusion and no pneumothorax. Stable changes from  prior CABG surgery. IMPRESSION: 1. No significant change from the previous day's study. 2. Interstitial and subtle hazy airspace lung opacities that are consistent with multifocal pneumonia. Electronically Signed   By: Amie Portland M.D.   On: 11/10/2019 07:18   DG Chest Port 1 View  Result Date: 11/25/2019 CLINICAL DATA:  SOB, increased B.P., tachycardia, O2 sats in low - mid 90's, COVID exposure. Hx CABG 2005, Diabetes, Chronic bronchitis, and asthma. EXAM: PORTABLE CHEST - 1 VIEW COMPARISON:  08/04/2015 FINDINGS: Lungs are clear. Heart size and mediastinal contours are within normal limits. Previous CABG. No definite effusion. No pneumothorax. Cervical fixation hardware noted. IMPRESSION: No acute disease post CABG. Electronically Signed   By: Corlis Leak M.D.   On: 11/21/2019 13:41   DG Chest Port 1V same Day  Result Date: 11/09/2019 CLINICAL DATA:  Respiratory distress. COVID positive unit. EXAM: PORTABLE CHEST 1 VIEW COMPARISON:  Chest x-ray dated 12/06/2019. FINDINGS: Borderline cardiomegaly, stable. New hazy ground-glass opacities within the mid and lower lung zones bilaterally, suspicious for multifocal pneumonia. No pleural effusion or pneumothorax is seen. IMPRESSION: New hazy ground-glass opacities within the mid and lower lung zones bilaterally, suspicious for multifocal pneumonia, alternatively mild edema and/or atelectasis. Electronically Signed   By: Bary Richard M.D.   On: 11/09/2019 08:24   VAS [place] LOWER EXTREMITY VENOUS (DVT)  Result Date: 11/10/2019  Lower Venous DVTStudy Indications: Covid-19, elevated D-dimer.  Comparison Study: No prior study on file Performing Technologist: Sherren Kerns RVS  Examination Guidelines: A complete evaluation includes B-mode imaging, spectral Doppler, color Doppler, and power Doppler as needed of all accessible portions of each vessel. Bilateral testing is considered an integral part of a complete examination. Limited examinations for reoccurring  indications Lack be performed as noted. The reflux portion of the exam is performed with the patient in reverse Trendelenburg.  +---------+---------------+---------+-----------+----------+--------------+ RIGHT    CompressibilityPhasicitySpontaneityPropertiesThrombus Aging +---------+---------------+---------+-----------+----------+--------------+ CFV      Full           Yes      Yes                                 +---------+---------------+---------+-----------+----------+--------------+  SFJ      Full                                                        +---------+---------------+---------+-----------+----------+--------------+ FV Prox  Full                                                        +---------+---------------+---------+-----------+----------+--------------+ FV Mid   Full                                                        +---------+---------------+---------+-----------+----------+--------------+ FV DistalFull                                                        +---------+---------------+---------+-----------+----------+--------------+ PFV      Full                                                        +---------+---------------+---------+-----------+----------+--------------+ POP      Full           Yes      Yes                                 +---------+---------------+---------+-----------+----------+--------------+ PTV      Full                                                        +---------+---------------+---------+-----------+----------+--------------+ PERO     Full                                                        +---------+---------------+---------+-----------+----------+--------------+   +---------+---------------+---------+-----------+----------+--------------+ LEFT     CompressibilityPhasicitySpontaneityPropertiesThrombus Aging  +---------+---------------+---------+-----------+----------+--------------+ CFV      Full           Yes      Yes                                 +---------+---------------+---------+-----------+----------+--------------+ SFJ      Full                                                        +---------+---------------+---------+-----------+----------+--------------+  FV Prox  Full                                                        +---------+---------------+---------+-----------+----------+--------------+ FV Mid   Full                                                        +---------+---------------+---------+-----------+----------+--------------+ FV DistalFull                                                        +---------+---------------+---------+-----------+----------+--------------+ PFV      Full                                                        +---------+---------------+---------+-----------+----------+--------------+ POP      Full           Yes      Yes                                 +---------+---------------+---------+-----------+----------+--------------+ PTV      Full                                                        +---------+---------------+---------+-----------+----------+--------------+ PERO     Full                                                        +---------+---------------+---------+-----------+----------+--------------+     Summary: BILATERAL: - No evidence of deep vein thrombosis seen in the lower extremities, bilaterally.   *See table(s) above for measurements and observations. Electronically signed by Gretta Began MD on 11/10/2019 at 8:17:42 AM.    Final

## 2019-11-11 NOTE — Progress Notes (Signed)
Patient placed in prone position by RT x 1 and RN x 4 without complications.  ETT secured with cloth tape, cheeks and upper lip padded.

## 2019-11-11 NOTE — Progress Notes (Signed)
Xray int o obtain chest xray pt self extubated Dr Vassie Loll and RT  Notified. Patient re intubated without difficulty. Marland Kitchen

## 2019-11-11 NOTE — Progress Notes (Addendum)
eLink Physician-Brief Progress Note Patient Name: [name] DOB: [birth year] MRN: [id number]   Date of Service  11/11/2019  HPI/Events of Note  Patient intubated today and is on Lantus + AC/HS resistant Novolog SSI. Last blood glucose = 148.  eICU Interventions  Will order: 1. D/C AC/HS resistant Novolog SSI. 2. D10W IV infusion at 30 mL/hour.      Intervention Category Major Interventions: Hyperglycemia - active titration of insulin therapy  Lenell Antu 11/11/2019, 8:59 PM

## 2019-11-11 NOTE — Progress Notes (Signed)
Patient's head turned to the right and arms rotated.

## 2019-11-11 NOTE — Plan of Care (Signed)
High risk for intubation.  Pt confusion and impulsiveness makes it hard to keep o2 sats >90

## 2019-11-11 NOTE — Procedures (Addendum)
Intubation Procedure Note [name] [id number] Feb 17, 1956  Procedure: Intubation Indications: Respiratory insufficiency , self extubated, saturation 60s, being bagged on my arrival  Procedure Details Consent: Unable to obtain consent because of emergent medical necessity. Time Out: Verified patient identification, verified procedure, site/side was marked, verified correct patient position, special equipment/implants available, medications/allergies/relevent history reviewed, required imaging and test results available.  Performed  Maximum sterile technique was used including cap, gloves, gown, hand hygiene and mask.  MAC and 3  Versed 2 Fent 100 Etomidate 20 Rocuronium 50  Evaluation Hemodynamic Status: BP stable throughout; O2 sats: transiently fell during during procedure and currently acceptable Patient's Current Condition: stable Complications: No apparent complications Patient did tolerate procedure well. Chest X-ray ordered to verify placement.  CXR: pending.   Leanna Sato Elsworth Soho 11/11/2019

## 2019-11-11 NOTE — Progress Notes (Signed)
Patient's head turned to the left and arms rotated. 

## 2019-11-11 NOTE — Progress Notes (Signed)
Pt with growing agitation, wants to drink water.  Moistened mouth for comfort.  Pt pulls off oxygen and is requiring very frequent reorientation to hospital setting and oxygen necessity, sats found to be in 70s without o2.  HiFlo taped to face for security, NRB replaced, sats up to 91-95%.  Bedside report to [place], Charity fundraiser.

## 2019-11-11 NOTE — Progress Notes (Signed)
Patient self extubated, requiring re-intubation.  Peep changed to 14 per Dr. Vassie Loll.

## 2019-11-11 NOTE — Procedures (Signed)
Intubation Procedure Note Jarett Dralle Vides  [id number]  1955/08/14    Procedure: Intubation Indications: Respiratory insufficiency   Procedure Details Consent: Unable to obtain consent because of altered level of consciousness. Time Out: Performed   Maximum clean technique was used including gloves, hand hygiene and mask  Laryngoscopy equipment used: MAC 3 Glidescope   Medications: Fentanyl 100 mcg, Versed 2 mg, Etomidate 25 mg, Rocuronium 50 mg   Grade 1 airway view   Evaluation Hemodynamic Status: Transient hypotension treated with fluid, stable throughout and transiently fell during during procedure Patient's Current Condition: stable Patient did tolerate procedure well Chest X-ray ordered to verify placement.  pending  Ina Homes, MD  [facility] PGY3  Pager: [phone]  11/11/2019 11:02 AM
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
## 2019-11-11 NOTE — Procedures (Signed)
Central Venous Catheter Insertion Procedure Note [name] [id number] 1955/11/05  Procedure: Insertion of Central Venous Catheter Indications: Drug and/or fluid administration  Procedure Details Consent: Risks of procedure as well as the alternatives and risks of each were explained to the (patient/caregiver).  Consent for procedure obtained. Time Out: Verified patient identification, verified procedure, site/side was marked, verified correct patient position, special equipment/implants available, medications/allergies/relevent history reviewed, required imaging and test results available.  Performed  Maximum sterile technique was used including antiseptics, cap, gloves, gown, hand hygiene, mask and sheet. Skin prep: Chlorhexidine; local anesthetic administered A antimicrobial bonded/coated triple lumen catheter was placed in the right internal jugular vein using the Seldinger technique. Ultrasound guidance used.Yes.   Catheter placed to 16 cm. Blood aspirated via all 3 ports and then flushed x 3. Line sutured x 2 and dressing applied.  Evaluation Blood flow good Complications: No apparent complications Patient did tolerate procedure well. Chest X-ray ordered to verify placement.  CXR: pending.  [name], MD IMTS PGY3  Pager: [phone]

## 2019-11-11 NOTE — Progress Notes (Signed)
Call to [facility] about no urine out put for 7pm-now.

## 2019-11-11 NOTE — Progress Notes (Signed)
Patient intermittently confused and impulsive.  Needs frequent reorientation.  Bilateral soft restraints in place for safety.  Pt desats with NRB off, encouraged to keep mask on despite the "noise" from the [facility]

## 2019-11-11 NOTE — Progress Notes (Signed)
Pt easily sedated with precedex, however breathing more labored with sleep/sedation.  Pt desaturates to the 80s.  Precedex off at this time.

## 2019-11-11 NOTE — Progress Notes (Signed)
Updated the patient's wife and the patient self-extubating and needing to be re-intubated. Discussed current care plan. All questions and concerns addressed.   [name], MD  IMTS PGY3  Pager: [phone]

## 2019-11-12 DIAGNOSIS — G9340 Encephalopathy, unspecified: Secondary | ICD-10-CM

## 2019-11-12 DIAGNOSIS — J80 Acute respiratory distress syndrome: Secondary | ICD-10-CM

## 2019-11-12 LAB — CBC WITH DIFFERENTIAL/PLATELET
Abs Immature Granulocytes: 0.05 10*3/uL (ref 0.00–0.07)
Basophils Absolute: 0 10*3/uL (ref 0.0–0.1)
Basophils Relative: 0 %
Eosinophils Absolute: 0 10*3/uL (ref 0.0–0.5)
Eosinophils Relative: 1 %
HCT: 49.3 % (ref 39.0–52.0)
Hemoglobin: 15.6 g/dL (ref 13.0–17.0)
Immature Granulocytes: 1 %
Lymphocytes Relative: 5 %
Lymphs Abs: 0.4 10*3/uL — ABNORMAL LOW (ref 0.7–4.0)
MCH: 29.2 pg (ref 26.0–34.0)
MCHC: 31.6 g/dL (ref 30.0–36.0)
MCV: 92.1 fL (ref 80.0–100.0)
Monocytes Absolute: 0.2 10*3/uL (ref 0.1–1.0)
Monocytes Relative: 2 %
Neutro Abs: 7.3 10*3/uL (ref 1.7–7.7)
Neutrophils Relative %: 91 %
Platelets: 339 10*3/uL (ref 150–400)
RBC: 5.35 MIL/uL (ref 4.22–5.81)
RDW: 13.1 % (ref 11.5–15.5)
WBC: 8 10*3/uL (ref 4.0–10.5)
nRBC: 0 % (ref 0.0–0.2)

## 2019-11-12 LAB — COMPREHENSIVE METABOLIC PANEL
ALT: 28 U/L (ref 0–44)
AST: 56 U/L — ABNORMAL HIGH (ref 15–41)
Albumin: 2.5 g/dL — ABNORMAL LOW (ref 3.5–5.0)
Alkaline Phosphatase: 75 U/L (ref 38–126)
Anion gap: 12 (ref 5–15)
BUN: 45 mg/dL — ABNORMAL HIGH (ref 8–23)
CO2: 26 mmol/L (ref 22–32)
Calcium: 8.9 mg/dL (ref 8.9–10.3)
Chloride: 108 mmol/L (ref 98–111)
Creatinine, Ser: 1 mg/dL (ref 0.61–1.24)
GFR calc Af Amer: >60 mL/min (ref >60)
GFR calc non Af Amer: >60 mL/min (ref >60)
Glucose, Bld: 230 mg/dL — ABNORMAL HIGH (ref 70–99)
Potassium: 5.5 mmol/L — ABNORMAL HIGH (ref 3.5–5.1)
Sodium: 146 mmol/L — ABNORMAL HIGH (ref 135–145)
Total Bilirubin: 0.6 mg/dL (ref 0.3–1.2)
Total Protein: 5.6 g/dL — ABNORMAL LOW (ref 6.5–8.1)

## 2019-11-12 LAB — C-REACTIVE PROTEIN: CRP: 5.1 mg/dL — ABNORMAL HIGH (ref <1.0)

## 2019-11-12 LAB — PHOSPHORUS: Phosphorus: 5.3 mg/dL — ABNORMAL HIGH (ref 2.5–4.6)

## 2019-11-12 LAB — GLUCOSE, CAPILLARY
Glucose-Capillary: 246 mg/dL — ABNORMAL HIGH (ref 70–99)
Glucose-Capillary: 208 mg/dL — ABNORMAL HIGH (ref 70–99)
Glucose-Capillary: 209 mg/dL — ABNORMAL HIGH (ref 70–99)
Glucose-Capillary: 219 mg/dL — ABNORMAL HIGH (ref 70–99)
Glucose-Capillary: 155 mg/dL — ABNORMAL HIGH (ref 70–99)
Glucose-Capillary: 212 mg/dL — ABNORMAL HIGH (ref 70–99)

## 2019-11-12 LAB — POCT I-STAT 7, (LYTES, BLD GAS, ICA,H+H)
Acid-Base Excess: 1 mmol/L (ref 0.0–2.0)
Bicarbonate: 28.8 mmol/L — ABNORMAL HIGH (ref 20.0–28.0)
Calcium, Ion: 1.32 mmol/L (ref 1.15–1.40)
HCT: 43 % (ref 39.0–52.0)
Hemoglobin: 14.6 g/dL (ref 13.0–17.0)
O2 Saturation: 99 %
Patient temperature: 98.2
Potassium: 5 mmol/L (ref 3.5–5.1)
Sodium: 144 mmol/L (ref 135–145)
TCO2: 31 mmol/L (ref 22–32)
pCO2 arterial: 56.7 mmHg — ABNORMAL HIGH (ref 32.0–48.0)
pH, Arterial: 7.313 — ABNORMAL LOW (ref 7.350–7.450)
pO2, Arterial: 136 mmHg — ABNORMAL HIGH (ref 83.0–108.0)

## 2019-11-12 LAB — D-DIMER, QUANTITATIVE: D-Dimer, Quant: 2.05 ug/mL-FEU — ABNORMAL HIGH (ref 0.00–0.50)

## 2019-11-12 LAB — MAGNESIUM: Magnesium: 2.9 mg/dL — ABNORMAL HIGH (ref 1.7–2.4)

## 2019-11-12 LAB — TRIGLYCERIDES: Triglycerides: 235 mg/dL — ABNORMAL HIGH (ref <150)

## 2019-11-12 MED ORDER — VITAL HIGH PROTEIN PO LIQD
1000.0000 mL | ORAL | Status: DC
  Administered 2019-11-12: 1000 mL

## 2019-11-12 MED ORDER — PRO-STAT SUGAR FREE PO LIQD
30.0000 mL | Freq: Four times a day (QID) | ORAL | Status: DC
  Administered 2019-11-12 – 2019-11-26 (×57): 30 mL
  Filled 2019-11-12 (×52): qty 30

## 2019-11-12 MED ORDER — INSULIN GLARGINE 100 UNIT/ML ~~LOC~~ SOLN: 30.0000 [IU] | Freq: Two times a day (BID) | SUBCUTANEOUS | Status: DC

## 2019-11-12 MED ORDER — ADULT MULTIVITAMIN W/MINERALS CH
1.0000 | ORAL_TABLET | Freq: Every day | ORAL | Status: DC
  Administered 2019-11-12 – 2020-01-07 (×55): 1
  Filled 2019-11-12 (×55): qty 1

## 2019-11-12 MED ORDER — CLONAZEPAM 0.5 MG PO TBDP
1.0000 mg | ORAL_TABLET | Freq: Two times a day (BID) | ORAL | Status: DC
  Administered 2019-11-12 – 2019-11-22 (×21): 1 mg
  Filled 2019-11-12 (×21): qty 2

## 2019-11-12 MED ORDER — INSULIN GLARGINE 100 UNIT/ML ~~LOC~~ SOLN
25.0000 [IU] | Freq: Two times a day (BID) | SUBCUTANEOUS | Status: DC
  Administered 2019-11-12 – 2019-11-14 (×5): 25 [IU] via SUBCUTANEOUS
  Filled 2019-11-12 (×7): qty 0.25

## 2019-11-12 MED ORDER — FREE WATER
200.0000 mL | Freq: Four times a day (QID) | Status: DC
  Administered 2019-11-12 – 2019-11-23 (×42): 200 mL

## 2019-11-12 MED ORDER — VITAL AF 1.2 CAL PO LIQD
1000.0000 mL | ORAL | Status: DC
  Administered 2019-11-12 – 2019-11-25 (×10): 1000 mL

## 2019-11-12 NOTE — Progress Notes (Signed)
Patient's head turned to the left and arms rotated. 

## 2019-11-12 NOTE — Progress Notes (Signed)
Patient placed in supine position by RT x 1 and RN x 4 without complications.  ETT re-secured with a commercial tube holder at 23 on the right.

## 2019-11-12 NOTE — Progress Notes (Signed)
Nutrition Follow-up  DOCUMENTATION CODES:   Not applicable  INTERVENTION:   Tube Feeding:  Vital AF 1.2 at 40 mlhr Pro-Stat 30 mL x 4 daily Provides 1552 kcals, 132 g of protein and 778 mL of free water  TF regimen and propofol at current rate providing 1840 total kcal/day   Add Free Water flushes; 200 mL q 6 hours  Add MVI with Minerals daily   NUTRITION DIAGNOSIS:   Inadequate oral intake related to inability to eat, acute illness as evidenced by NPO status.  GOAL:   Patient will meet greater than or equal to 90% of their needs  MONITOR:   Vent status, Labs, Weight trends, TF tolerance  REASON FOR ASSESSMENT:   Ventilator, Consult Enteral/tube feeding initiation and management  ASSESSMENT:   64 yo male with ARDS secondary to COVID-19 pnuemonia requiring intubation, acute encephalopathy with hx of heavy EtOH abuse, AKI. PMH includes DM, HTN, CAD s/p PCI/CABG   4/02 Admitted 4/05 Intubated, Paralyzed, Prone  Patient is currently intubated on ventilator support MV: 11.5 L/min Temp (24hrs), Avg:97.9 F (36.6 C), Min:97.5 F (36.4 C), Max:98.4 F (36.9 C)  Propofol: 10.9 ml/hr  Noted order for Cortrak, Cortrak service not available again until tomorrow 4/07. Pt with OG tube in place, plan to initiate TF via OG  Unable to obtain diet and weight history at this time. Not weight loss trend noted in weight encounters.   Admission weight 90.3 kg; no weight since.   Labs: CBGs 148-246, potassium 5.4 (H), sodium 145 (wdl) Meds: ss novolog, lantus, tradjenta, solumedrol,    Diet Order:   Diet Order            Diet NPO time specified  Diet effective now              EDUCATION NEEDS:   Not appropriate for education at this time  Skin:  Skin Assessment: Reviewed RN Assessment  Last BM:  4/3  Height:   Ht Readings from Last 1 Encounters:  11/11/19 5\' 8"  (1.727 m)    Weight:   Wt Readings from Last 1 Encounters:  11/26/2019 90.3 kg    BMI:   Body mass index is 30.27 kg/m.  Estimated Nutritional Needs:   Kcal:  1530-1810 kcals  Protein:  135-160 g  Fluid:  >/= 1.5 L   01/08/20 MS, RDN, LDN, CNSC RD Pager Number and On-Call Pager Located in [place]

## 2019-11-12 NOTE — Progress Notes (Signed)
Patient's head turned to right and arms repositioned. 

## 2019-11-12 NOTE — Progress Notes (Signed)
Patient's head turned to left and arms repositioned. 

## 2019-11-12 NOTE — Progress Notes (Signed)
Patient placed in prone position by RT x 1, NT x 1, and RN x 2 without complications.  ETT secured with cloth tape in the center at 23.  Foam pads placed on both cheeks and above the upper lip.m Patient's head is turned to the right.

## 2019-11-12 NOTE — Progress Notes (Addendum)
NAME:  [name], MRN:  [id number], DOB:  07-16-1956, LOS: 4 ADMISSION DATE:  2019/11/26, CONSULTATION DATE:  11/11/19 REFERRING MD:  Maretta Bees, MD, CHIEF COMPLAINT:  SHOB   Brief History    Admitted with Covid pneumonia 4/2 and intubated for hypoxic delirium 4/5   Past Medical History   CAD s/p PCI/CABG, bronchial asthma, DM-2, LD, HTN-  Significant Hospital Events   4/2>> admit to [facility] for worsening hypoxemia 4/3>> worsening hypoxemia-on 15 L HFNC and NRB 4/3>>Lower ext Doppler neg for DVT 4/4>>worsening confusion-pulling NRB-transfer to ICU 4/5 confused and delirious, intermittent desaturation when he takes nonrebreather off intubated, paralyzed & prone  Consults:  PCCM  Procedures:  ETT 4/5 >> RIJ 4/5 >>  Significant Diagnostic Tests:  11/10/19 CXR >> bilateral infiltrates R > L  Micro Data:  4/2: Blood cultures>> neg 4/5 resp >>  Antimicrobials:  Rocephin: 4/2>> Zithromax: 4/3>>4/5  Interim history/subjective:   Sedated fentanyl and propofol, critically ill, intubated Paralyzed in prone position   Objective   Blood pressure 122/77, pulse 94, temperature 97.9 F (36.6 C), temperature source Oral, resp. rate (!) 24, height 5\' 8"  (1.727 m), weight 90.3 kg, SpO2 100 %.    Vent Mode: PRVC FiO2 (%):  [100 %] 100 % Set Rate:  [24 bmp] 24 bmp Vt Set:  [480 mL] 480 mL PEEP:  [10 cmH20-14 cmH20] 14 cmH20 Plateau Pressure:  [21 cmH20-30 cmH20] 26 cmH20   Intake/Output Summary (Last 24 hours) at 11/12/2019 1016 Last data filed at 11/12/2019 0900 Gross per 24 hour  Intake 1518.23 ml  Output 900 ml  Net 618.23 ml   Filed Weights   11-26-19 1256  Weight: 90.3 kg   Examination: General: Elderly male, intubated, prone HENT: Normocephalic, atraumatic, moist mucus membranes, oral ETT  Pulm: Good air movement with course airway sounds CV: Unable, tachycardic on monitor Abdomen: Unable Extremities: Pulses palpable in all extremities, no LE edema  Skin: Cool  and dry  Neuro: Sedated, RASS -5, intermittently triggers went  Chest x-ray 4/5 personally reviewed which shows ET tube and IJ in good position, bilateral interstitial infiltrates.  ABG shows mild respiratory acidosis and severe hypoxia  Resolved Hospital Problem list   N/A  Assessment & Plan:   ARDS 2/2 COVID-19 PNA  Asthma   -Lung protective ventilation, 6 cc/kg, PEEP at 14, driving pressure 12, plateau pressure of 26 -Prone position for 16 hours daily - PRN albuterol  -Deep sedation goal RASS -5 and as needed vecuronium for asynchrony   COVID pneumonia  - s/p actemra - Continuing Solumedrol 30 mg BID x 10ds and Remdesivir x 5ds - Procalcitonin elevated on admission. Continue ceftriaxone   Acute Encephalopathy  -Heavy alcohol user -  Continue propofol/fentanyl -Add clonazepam 1 twice daily -Continue home trazodone  AKI  - Baseline creatinine 0.9, up to 1.5 on admission  - await labs  DM - CBG goal < 180  - Continue Lantus 25 units BID, Novolog 8 units TID WC + SSI -dc D10, start TFs    Best practice:  Diet: Heart Healthy Pain/Anxiety/Delirium protocol (if indicated): Propofol/fent   VAP protocol (if indicated): N/A DVT prophylaxis: Lovenox GI prophylaxis: Protonix Glucose control: SSI Mobility: Bed rest Code Status: Full code Family Communication: spouse, Sybil Freund. Disposition: ICU   The patient is critically ill with multiple organ systems failure and requires high complexity decision making for assessment and support, frequent evaluation and titration of therapies, application of advanced monitoring technologies and extensive interpretation  of multiple databases. Critical Care Time devoted to patient care services described in this note independent of APP/resident  time is 35 minutes.    Kara Mead MD. [place]. [facility] [place] [facility]  If no response to pager , please call [phone]   11/12/2019

## 2019-11-13 ENCOUNTER — Inpatient Hospital Stay (HOSPITAL_COMMUNITY): Payer: Medicare Other

## 2019-11-13 DIAGNOSIS — K219 Gastro-esophageal reflux disease without esophagitis: Secondary | ICD-10-CM

## 2019-11-13 DIAGNOSIS — J4520 Mild intermittent asthma, uncomplicated: Secondary | ICD-10-CM

## 2019-11-13 DIAGNOSIS — E119 Type 2 diabetes mellitus without complications: Secondary | ICD-10-CM

## 2019-11-13 DIAGNOSIS — I10 Essential (primary) hypertension: Secondary | ICD-10-CM

## 2019-11-13 LAB — BASIC METABOLIC PANEL
Anion gap: 9 (ref 5–15)
BUN: 46 mg/dL — ABNORMAL HIGH (ref 8–23)
CO2: 30 mmol/L (ref 22–32)
Calcium: 9.7 mg/dL (ref 8.9–10.3)
Chloride: 104 mmol/L (ref 98–111)
Creatinine, Ser: 1.08 mg/dL (ref 0.61–1.24)
GFR calc Af Amer: >60 mL/min (ref >60)
GFR calc non Af Amer: >60 mL/min (ref >60)
Glucose, Bld: 325 mg/dL — ABNORMAL HIGH (ref 70–99)
Potassium: 5.8 mmol/L — ABNORMAL HIGH (ref 3.5–5.1)
Sodium: 143 mmol/L (ref 135–145)

## 2019-11-13 LAB — MAGNESIUM
Magnesium: 2.7 mg/dL — ABNORMAL HIGH (ref 1.7–2.4)
Magnesium: 2.5 mg/dL — ABNORMAL HIGH (ref 1.7–2.4)

## 2019-11-13 LAB — POCT I-STAT 7, (LYTES, BLD GAS, ICA,H+H)
Acid-Base Excess: 6 mmol/L — ABNORMAL HIGH (ref 0.0–2.0)
Bicarbonate: 33.1 mmol/L — ABNORMAL HIGH (ref 20.0–28.0)
Calcium, Ion: 1.44 mmol/L — ABNORMAL HIGH (ref 1.15–1.40)
HCT: 40 % (ref 39.0–52.0)
Hemoglobin: 13.6 g/dL (ref 13.0–17.0)
O2 Saturation: 88 %
Potassium: 5.6 mmol/L — ABNORMAL HIGH (ref 3.5–5.1)
Sodium: 143 mmol/L (ref 135–145)
TCO2: 35 mmol/L — ABNORMAL HIGH (ref 22–32)
pCO2 arterial: 58.9 mmHg — ABNORMAL HIGH (ref 32.0–48.0)
pH, Arterial: 7.358 (ref 7.350–7.450)
pO2, Arterial: 58 mmHg — ABNORMAL LOW (ref 83.0–108.0)

## 2019-11-13 LAB — D-DIMER, QUANTITATIVE: D-Dimer, Quant: 3.87 ug/mL-FEU — ABNORMAL HIGH (ref 0.00–0.50)

## 2019-11-13 LAB — CBC WITH DIFFERENTIAL/PLATELET
Abs Immature Granulocytes: 0.14 10*3/uL — ABNORMAL HIGH (ref 0.00–0.07)
Basophils Absolute: 0 10*3/uL (ref 0.0–0.1)
Basophils Relative: 0 %
Eosinophils Absolute: 0 10*3/uL (ref 0.0–0.5)
Eosinophils Relative: 0 %
HCT: 48.6 % (ref 39.0–52.0)
Hemoglobin: 14.9 g/dL (ref 13.0–17.0)
Immature Granulocytes: 1 %
Lymphocytes Relative: 3 %
Lymphs Abs: 0.3 10*3/uL — ABNORMAL LOW (ref 0.7–4.0)
MCH: 28.8 pg (ref 26.0–34.0)
MCHC: 30.7 g/dL (ref 30.0–36.0)
MCV: 93.8 fL (ref 80.0–100.0)
Monocytes Absolute: 0.2 10*3/uL (ref 0.1–1.0)
Monocytes Relative: 2 %
Neutro Abs: 9.5 10*3/uL — ABNORMAL HIGH (ref 1.7–7.7)
Neutrophils Relative %: 94 %
Platelets: 297 10*3/uL (ref 150–400)
RBC: 5.18 MIL/uL (ref 4.22–5.81)
RDW: 12.9 % (ref 11.5–15.5)
WBC: 10.1 10*3/uL (ref 4.0–10.5)
nRBC: 0.2 % (ref 0.0–0.2)

## 2019-11-13 LAB — CULTURE, RESPIRATORY W GRAM STAIN: Culture: NORMAL

## 2019-11-13 LAB — CULTURE, BLOOD (ROUTINE X 2)
Culture: NO GROWTH
Culture: NO GROWTH
Special Requests: ADEQUATE

## 2019-11-13 LAB — COMPREHENSIVE METABOLIC PANEL
ALT: 26 U/L (ref 0–44)
AST: 50 U/L — ABNORMAL HIGH (ref 15–41)
Albumin: 2.3 g/dL — ABNORMAL LOW (ref 3.5–5.0)
Alkaline Phosphatase: 77 U/L (ref 38–126)
Anion gap: 10 (ref 5–15)
BUN: 45 mg/dL — ABNORMAL HIGH (ref 8–23)
CO2: 28 mmol/L (ref 22–32)
Calcium: 10 mg/dL (ref 8.9–10.3)
Chloride: 105 mmol/L (ref 98–111)
Creatinine, Ser: 1 mg/dL (ref 0.61–1.24)
GFR calc Af Amer: >60 mL/min (ref >60)
GFR calc non Af Amer: >60 mL/min (ref >60)
Glucose, Bld: 285 mg/dL — ABNORMAL HIGH (ref 70–99)
Potassium: 6 mmol/L — ABNORMAL HIGH (ref 3.5–5.1)
Sodium: 143 mmol/L (ref 135–145)
Total Bilirubin: 0.3 mg/dL (ref 0.3–1.2)
Total Protein: 6 g/dL — ABNORMAL LOW (ref 6.5–8.1)

## 2019-11-13 LAB — GLUCOSE, CAPILLARY
Glucose-Capillary: 226 mg/dL — ABNORMAL HIGH (ref 70–99)
Glucose-Capillary: 237 mg/dL — ABNORMAL HIGH (ref 70–99)
Glucose-Capillary: 252 mg/dL — ABNORMAL HIGH (ref 70–99)
Glucose-Capillary: 241 mg/dL — ABNORMAL HIGH (ref 70–99)
Glucose-Capillary: 176 mg/dL — ABNORMAL HIGH (ref 70–99)
Glucose-Capillary: 236 mg/dL — ABNORMAL HIGH (ref 70–99)

## 2019-11-13 LAB — PHOSPHORUS
Phosphorus: 5.6 mg/dL — ABNORMAL HIGH (ref 2.5–4.6)
Phosphorus: 4.9 mg/dL — ABNORMAL HIGH (ref 2.5–4.6)

## 2019-11-13 LAB — C-REACTIVE PROTEIN: CRP: 3.3 mg/dL — ABNORMAL HIGH (ref <1.0)

## 2019-11-13 LAB — TRIGLYCERIDES: Triglycerides: 254 mg/dL — ABNORMAL HIGH (ref <150)

## 2019-11-13 MED ORDER — SODIUM POLYSTYRENE SULFONATE 15 GM/60ML PO SUSP
30.0000 g | Freq: Once | ORAL | Status: AC
  Administered 2019-11-13: 30 g
  Filled 2019-11-13: qty 120

## 2019-11-13 MED ORDER — INSULIN ASPART 100 UNIT/ML ~~LOC~~ SOLN
5.0000 [IU] | SUBCUTANEOUS | Status: DC
  Administered 2019-11-13 – 2019-11-19 (×34): 5 [IU] via SUBCUTANEOUS

## 2019-11-13 MED ORDER — INSULIN ASPART 100 UNIT/ML ~~LOC~~ SOLN
0.0000 [IU] | SUBCUTANEOUS | Status: DC
  Administered 2019-11-13: 7 [IU] via SUBCUTANEOUS
  Administered 2019-11-13 – 2019-11-14 (×2): 4 [IU] via SUBCUTANEOUS
  Administered 2019-11-14 (×2): 7 [IU] via SUBCUTANEOUS
  Administered 2019-11-14 – 2019-11-15 (×3): 4 [IU] via SUBCUTANEOUS
  Administered 2019-11-15 (×3): 7 [IU] via SUBCUTANEOUS
  Administered 2019-11-15 – 2019-11-16 (×5): 4 [IU] via SUBCUTANEOUS
  Administered 2019-11-16: 3 [IU] via SUBCUTANEOUS
  Administered 2019-11-16: 4 [IU] via SUBCUTANEOUS
  Administered 2019-11-17: 3 [IU] via SUBCUTANEOUS
  Administered 2019-11-17: 4 [IU] via SUBCUTANEOUS
  Administered 2019-11-17: 3 [IU] via SUBCUTANEOUS
  Administered 2019-11-17: 04:00:00 4 [IU] via SUBCUTANEOUS
  Administered 2019-11-17: 7 [IU] via SUBCUTANEOUS
  Administered 2019-11-17: 4 [IU] via SUBCUTANEOUS
  Administered 2019-11-17: 7 [IU] via SUBCUTANEOUS
  Administered 2019-11-18 – 2019-11-19 (×7): 4 [IU] via SUBCUTANEOUS
  Administered 2019-11-19: 7 [IU] via SUBCUTANEOUS

## 2019-11-13 MED ORDER — PROPOFOL 1000 MG/100ML IV EMUL
0.0000 ug/kg/min | INTRAVENOUS | Status: DC
  Administered 2019-11-13: 21:00:00 20 ug/kg/min via INTRAVENOUS
  Administered 2019-11-14: 15 ug/kg/min via INTRAVENOUS
  Administered 2019-11-14: 20 ug/kg/min via INTRAVENOUS
  Administered 2019-11-14 – 2019-11-15 (×3): 25 ug/kg/min via INTRAVENOUS
  Administered 2019-11-15: 15 ug/kg/min via INTRAVENOUS
  Administered 2019-11-16: 25 ug/kg/min via INTRAVENOUS
  Administered 2019-11-16 – 2019-11-19 (×14): 40 ug/kg/min via INTRAVENOUS
  Administered 2019-11-20: 20 ug/kg/min via INTRAVENOUS
  Filled 2019-11-13 (×22): qty 100

## 2019-11-13 NOTE — Procedures (Signed)
Cortrak  Person Inserting Tube:  Renie Ora, RD Tube Type:  Cortrak - 43 inches Tube Location:  Right nare Initial Placement:  Stomach Secured by: Bridle Technique Used to Measure Tube Placement:  Documented cm marking at nare/ corner of mouth Cortrak Secured At:  91 cm Procedure Comments:  Cortrak Tube Team Note:  Consult received to place a Cortrak feeding tube.   No x-ray is required. RN Difatta begin using tube.    If the tube becomes dislodged please keep the tube and contact the Cortrak team at [email or web address] (password [email or web address]) for replacement.  If after hours and replacement cannot be delayed, place a NG tube and confirm placement with an abdominal x-ray.      Trenton Gammon, MS, RD, LDN, CNSC Inpatient Clinical Dietitian RD pager # available in AMION  After hours/weekend pager # available in [facility]

## 2019-11-13 NOTE — Progress Notes (Signed)
Pt returned to supine position with RT x1, RN x2 and NT x1. Pts ETT remains secure at 23cm with cloth tape and protective barriers. No breakdown to the lips or face noted at this time. RT will continue to monitor.

## 2019-11-13 NOTE — Progress Notes (Signed)
ABG noted  Will not RRONE this evening

## 2019-11-13 NOTE — Progress Notes (Signed)
Inpatient Diabetes Program Recommendations  AACE/ADA: New Consensus Statement on Inpatient Glycemic Control  Target Ranges:  Prepandial:   less than 140 mg/dL      Peak postprandial:   less than 180 mg/dL (1-2 hours)      Critically ill patients:  140 - 180 mg/dL   Results for DELONTE, MUSICH (MRN [id number]) as of 11/13/2019 13:29  Ref. Range 11/12/2019 07:25 11/12/2019 11:46 11/12/2019 15:26 11/12/2019 19:24 11/12/2019 23:24 11/13/2019 03:36 11/13/2019 07:44 11/13/2019 11:34  Glucose-Capillary Latest Ref Range: 70 - 99 mg/dL 830 (H) 940 (H) 768 (H) 155 (H) 212 (H) 226 (H) 237 (H) 252 (H)   Review of Glycemic Control  Current orders for Inpatient glycemic control: Lantus 25 units BID, Novolog 0-20 units Q4H, Tradjenta 5 mg daily; Solumedrol 20 mg Q12H, Vital @ 40 ml/hr  Inpatient Diabetes Program Recommendations:    Insulin-Tube Feeding Coverage: Please consider ordering Novolog 5 units Q4H for tube feeding coverage. If tube feeding is stopped or held then Novolog tube feeding coverage should also be stopped or held.  Thanks, Orlando Penner, RN, MSN, CDE Diabetes Coordinator Inpatient Diabetes Program [phone] (Team Pager from 8am to 5pm)

## 2019-11-13 NOTE — Progress Notes (Signed)
Upon RT entrance to pt room for routine vent check, ETT noted to be unsecure. Cloth tape was noted to be cut and removed from ETT and ETT unsecured at 18cm at the lip. Recent Cortrak placement done. RT readvanced ETT to 23cm with RN and resecured tube. Return volumes on the vent noted to improve. Pt tolerated well, RT will continue to monitor.

## 2019-11-13 NOTE — Progress Notes (Signed)
NAME:  [name], MRN:  [id number], DOB:  [birth year], LOS: 5 ADMISSION DATE:  11/07/2019, CONSULTATION DATE:  11/11/19 REFERRING MD:  Maretta Bees, MD, CHIEF COMPLAINT:  SHOB   Brief History    Admitted with Covid pneumonia 4/2 and intubated for hypoxic delirium 4/5 History of feeling unwell for about a week prior to presentation Decompensated about 24 hours following his positive test  Past Medical History   CAD s/p PCI/CABG, bronchial asthma, DM-2, LD, HTN-  Significant Hospital Events   4/2>> admit to [facility] for worsening hypoxemia 4/3>> worsening hypoxemia-on 15 L HFNC and NRB 4/3>>Lower ext Doppler neg for DVT 4/4>>worsening confusion-pulling NRB-transfer to ICU 4/5 confused and delirious, intermittent desaturation when he takes nonrebreather off intubated, paralyzed & prone  Consults:  PCCM  Procedures:  ETT 4/5 >> RIJ 4/5 >>  Significant Diagnostic Tests:  11/10/19 CXR >> bilateral infiltrates R > L 4/7 chest x-ray >>worsening aeration at the bases-reviewed by myself Micro Data:  4/2: Blood cultures>> neg 4/5 resp >>  Antimicrobials:  Rocephin: 4/2>> Zithromax: 4/3>>4/5  Interim history/subjective:   Sedated fentanyl and propofol, critically ill, intubated.  Paralyzed Prone   Objective   Blood pressure 126/79, pulse (!) 113, temperature 99.5 F (37.5 C), temperature source Oral, resp. rate (!) 27, height 5\' 8"  (1.727 m), weight 75.5 kg, SpO2 100 %.    Vent Mode: PRVC FiO2 (%):  [60 %-100 %] 60 % Set Rate:  [24 bmp] 24 bmp Vt Set:  [480 mL] 480 mL PEEP:  [14 cmH20] 14 cmH20 Plateau Pressure:  [19 cmH20-26 cmH20] 26 cmH20   Intake/Output Summary (Last 24 hours) at 11/13/2019 0911 Last data filed at 11/13/2019 0800 Gross per 24 hour  Intake 1822.92 ml  Output 1510 ml  Net 312.92 ml   Filed Weights   11/14/2019 1256 11/13/19 0500  Weight: 90.3 kg 75.5 kg   Examination: General: Elderly male, i from HENT: Tracheostomy in place Pulm: Fair breath  sounds bilaterally, posteriorly CV: Tachycardia on monitor Abdomen: Prone Extremities: Pulses palpable in all extremities, no LE edema  Skin: Cool and dry  Neuro: Sedated, paralyzed, RASS -5  Chest x-ray 4/7 personally reviewed bilateral infiltrates, ET tube appropriately placed ABG shows mild respiratory acidosis and severe hypoxia  Resolved Hospital Problem list   N/A  Assessment & Plan:   ARDS 2/2 COVID-19 PNA  Asthma  Continue mechanical ventilation per ARDS protocol Target TVol 6-8cc/kgIBW Target Plateau Pressure < 30cm H20 Target driving pressure less than 15 cm of water Target PaO2 55-65: titrate PEEP/FiO2 per protocol As long as PaO2 to FiO2 ratio is less than 1:150 position in prone position for 16 hours a day Albuterol as needed Vecuronium for vent synchrony   COVID pneumonia  -Status post Actemra -Solu-Medrol -Remdesivir -Continue ceftriaxone  Acute Encephalopathy  -Heavy alcohol user - Continue propofol/fentanyl - Continue clonazepam -  AKI  - Baseline creatinine 0.9, up to 1.5 on admission  -Avoid nephrotoxic's -Trend electrolytes  DM - CBG goal < 180  - Continue Lantus 25 units BID, Novolog 8 units TID WC + SSI -dc D10, start TFs    Best practice:  Diet: tube feeds Pain/Anxiety/Delirium protocol (if indicated): Propofol/fent   VAP protocol (if indicated): N/A DVT prophylaxis: Lovenox GI prophylaxis: Protonix Glucose control: SSI Mobility: Bed rest Code Status: Full code Family Communication: spouse, Sybil Settle. Disposition: ICU  The patient is critically ill with multiple organ systems failure and requires high complexity decision making for assessment and support,  frequent evaluation and titration of therapies, application of advanced monitoring technologies and extensive interpretation of multiple databases. Critical Care Time devoted to patient care services described in this note independent of APP/resident time (if applicable)  is 32  minutes.   Sherrilyn Rist MD [place] [facility] Personal pager: [phone] If unanswered, please page CCM On-call: [phone]

## 2019-11-13 NOTE — Progress Notes (Signed)
Call to [facility], made aware of potassium of 6.

## 2019-11-13 NOTE — Progress Notes (Signed)
eLink Physician-Brief Progress Note Patient Name: [name] DOB: 07/25/1956 MRN: [id number]   Date of Service  11/13/2019  HPI/Events of Note  Hyperkalemia - K+ = 6.0.  eICU Interventions  Will order: 1. Kayexalate 30 gm per tube now. 2. Repeat BMP at 12 noon.      Intervention Category Major Interventions: Electrolyte abnormality - evaluation and management  Sommer,Steven Eugene 11/13/2019, 6:29 AM

## 2019-11-13 NOTE — Progress Notes (Signed)
Patient's head turned to left and arms repositioned. 

## 2019-11-13 NOTE — Progress Notes (Signed)
ABG results given to Dr. Wynona Neat. PF ratio is 116. Verbal order received to no prone this patient at this time. RT will continue to monitor.

## 2019-11-14 DIAGNOSIS — I2510 Atherosclerotic heart disease of native coronary artery without angina pectoris: Secondary | ICD-10-CM

## 2019-11-14 LAB — GLUCOSE, CAPILLARY
Glucose-Capillary: 217 mg/dL — ABNORMAL HIGH (ref 70–99)
Glucose-Capillary: 116 mg/dL — ABNORMAL HIGH (ref 70–99)
Glucose-Capillary: 152 mg/dL — ABNORMAL HIGH (ref 70–99)
Glucose-Capillary: 177 mg/dL — ABNORMAL HIGH (ref 70–99)
Glucose-Capillary: 155 mg/dL — ABNORMAL HIGH (ref 70–99)
Glucose-Capillary: 225 mg/dL — ABNORMAL HIGH (ref 70–99)

## 2019-11-14 LAB — BASIC METABOLIC PANEL
Anion gap: 8 (ref 5–15)
BUN: 51 mg/dL — ABNORMAL HIGH (ref 8–23)
CO2: 33 mmol/L — ABNORMAL HIGH (ref 22–32)
Calcium: 9.6 mg/dL (ref 8.9–10.3)
Chloride: 105 mmol/L (ref 98–111)
Creatinine, Ser: 0.99 mg/dL (ref 0.61–1.24)
GFR calc Af Amer: >60 mL/min (ref >60)
GFR calc non Af Amer: >60 mL/min (ref >60)
Glucose, Bld: 174 mg/dL — ABNORMAL HIGH (ref 70–99)
Potassium: 5.2 mmol/L — ABNORMAL HIGH (ref 3.5–5.1)
Sodium: 146 mmol/L — ABNORMAL HIGH (ref 135–145)

## 2019-11-14 LAB — CBC WITH DIFFERENTIAL/PLATELET
Abs Immature Granulocytes: 0.17 10*3/uL — ABNORMAL HIGH (ref 0.00–0.07)
Basophils Absolute: 0 10*3/uL (ref 0.0–0.1)
Basophils Relative: 0 %
Eosinophils Absolute: 0 10*3/uL (ref 0.0–0.5)
Eosinophils Relative: 0 %
HCT: 42.6 % (ref 39.0–52.0)
Hemoglobin: 13.4 g/dL (ref 13.0–17.0)
Immature Granulocytes: 2 %
Lymphocytes Relative: 4 %
Lymphs Abs: 0.4 10*3/uL — ABNORMAL LOW (ref 0.7–4.0)
MCH: 29.4 pg (ref 26.0–34.0)
MCHC: 31.5 g/dL (ref 30.0–36.0)
MCV: 93.4 fL (ref 80.0–100.0)
Monocytes Absolute: 0.2 10*3/uL (ref 0.1–1.0)
Monocytes Relative: 2 %
Neutro Abs: 8.7 10*3/uL — ABNORMAL HIGH (ref 1.7–7.7)
Neutrophils Relative %: 92 %
Platelets: 259 10*3/uL (ref 150–400)
RBC: 4.56 MIL/uL (ref 4.22–5.81)
RDW: 12.7 % (ref 11.5–15.5)
WBC: 9.5 10*3/uL (ref 4.0–10.5)
nRBC: 0 % (ref 0.0–0.2)

## 2019-11-14 LAB — D-DIMER, QUANTITATIVE: D-Dimer, Quant: 3.71 ug/mL-FEU — ABNORMAL HIGH (ref 0.00–0.50)

## 2019-11-14 LAB — TRIGLYCERIDES: Triglycerides: 133 mg/dL (ref <150)

## 2019-11-14 LAB — MAGNESIUM: Magnesium: 2.4 mg/dL (ref 1.7–2.4)

## 2019-11-14 NOTE — Progress Notes (Signed)
NAME:  [name], MRN:  [id number], DOB:  01-25-1956, LOS: 6 ADMISSION DATE:  11/17/2019, CONSULTATION DATE:  11/11/19 REFERRING MD:  Maretta Bees, MD, CHIEF COMPLAINT:  SHOB   Brief History    Admitted with Covid pneumonia 4/2 and intubated for hypoxic delirium 4/5 History of feeling unwell for about a week prior to presentation Decompensated about 24 hours following his positive test  Past Medical History   CAD s/p PCI/CABG, bronchial asthma, DM-2, LD, HTN-  Significant Hospital Events   4/2>> admit to [facility] for worsening hypoxemia 4/3>> worsening hypoxemia-on 15 L HFNC and NRB 4/3>>Lower ext Doppler neg for DVT 4/4>>worsening confusion-pulling NRB-transfer to ICU 4/5 confused and delirious, intermittent desaturation when he takes nonrebreather off intubated, paralyzed & prone  Consults:  PCCM  Procedures:  ETT 4/5 >> RIJ 4/5 >>  Significant Diagnostic Tests:  11/10/19 CXR >> bilateral infiltrates R > L 4/7 chest x-ray >>worsening aeration at the bases-reviewed by myself Micro Data:  4/2: Blood cultures>> neg 4/5 resp >>  Antimicrobials:  Rocephin: 4/2>> Zithromax: 4/3>>4/5   Interim history/subjective:   Sedated fentanyl and propofol, critically ill, intubated.  Paralyzed Did not prone last evening 4/7   Objective   Blood pressure 110/72, pulse 73, temperature 97.8 F (36.6 C), temperature source Axillary, resp. rate (!) 24, height 5\' 8"  (1.727 m), weight 81.2 kg, SpO2 100 %.    Vent Mode: PRVC FiO2 (%):  [50 %-60 %] 60 % Set Rate:  [24 bmp] 24 bmp Vt Set:  [480 mL] 480 mL PEEP:  [14 cmH20] 14 cmH20 Plateau Pressure:  [24 cmH20] 24 cmH20   Intake/Output Summary (Last 24 hours) at 11/14/2019 1302 Last data filed at 11/14/2019 1300 Gross per 24 hour  Intake 1573.22 ml  Output 1565 ml  Net 8.22 ml   Filed Weights   11/18/2019 1256 11/13/19 0500 11/14/19 0500  Weight: 90.3 kg 75.5 kg 81.2 kg   Examination: General: Elderly male, comfortable HENT: Moist  oral mucosa Pulm: Fair breath sounds bilaterally, clear anteriorly CV: S1-S2 appreciated Abdomen: Bowel sounds appreciated Extremities: Pulses palpable in all extremities, no LE edema  Skin: Cool and dry  Neuro: Sedated, paralyzed, RASS -5  Chest x-ray 4/7 personally reviewed bilateral infiltrates, ET tube appropriately placed ABG shows mild respiratory acidosis and severe hypoxia Labs today 11/14/2019 reviewed by myself  Resolved Hospital Problem list   N/A  Assessment & Plan:   ARDS 2/2 COVID-19 PNA  Asthma  Continue mechanical ventilation per ARDS protocol Target TVol 6-8cc/kgIBW Target Plateau Pressure < 30cm H20 Target driving pressure less than 15 cm of water Target PaO2 55-65: titrate PEEP/FiO2 per protocol As long as PaO2 to FiO2 ratio is less than 1:150 position in prone position for 16 hours a day Albuterol as needed Vecuronium for vent synchrony    COVID pneumonia  -Status post Actemra -Solu-Medrol -Remdesivir -Continue ceftriaxone  Acute Encephalopathy  -Heavy alcohol user - Continue propofol/fentanyl - Continue clonazepam -  AKI  - Baseline creatinine 0.9, up to 1.5 on admission  -Avoid nephrotoxic's -Trend electrolytes  DM - CBG goal < 180  - Continue Lantus 25 units BID, Novolog 8 units TID WC + SSI -dc D10, start TFs  Patient did well overnight Did not require proning Continue to wean oxygen as tolerated   Best practice:  Diet: tube feeds Pain/Anxiety/Delirium protocol (if indicated): Propofol/fent   VAP protocol (if indicated): N/A DVT prophylaxis: Lovenox GI prophylaxis: Protonix Glucose control: SSI Mobility: Bed rest Code Status:  Full code Family Communication: Will update Disposition: ICU  The patient is critically ill with multiple organ systems failure and requires high complexity decision making for assessment and support, frequent evaluation and titration of therapies, application of advanced monitoring technologies and  extensive interpretation of multiple databases. Critical Care Time devoted to patient care services described in this note independent of APP/resident time (if applicable)  is 30 minutes.   Sherrilyn Rist MD [place] [facility] Personal pager: [phone] If unanswered, please page CCM On-call: [phone]

## 2019-11-14 NOTE — Progress Notes (Signed)
eLink Physician-Brief Progress Note Patient Name: [name] DOB: 01/15/1956 MRN: [id number]   Date of Service  11/14/2019  HPI/Events of Note  Request for AM lab orders.   eICU Interventions  Will order CBC with platelets, BMP and Mg++ level now.      Intervention Category Major Interventions: Other:  Lenell Antu 11/14/2019, 5:13 AM

## 2019-11-15 LAB — COMPREHENSIVE METABOLIC PANEL
ALT: 25 U/L (ref 0–44)
AST: 45 U/L — ABNORMAL HIGH (ref 15–41)
Albumin: 2.3 g/dL — ABNORMAL LOW (ref 3.5–5.0)
Alkaline Phosphatase: 68 U/L (ref 38–126)
Anion gap: 10 (ref 5–15)
BUN: 49 mg/dL — ABNORMAL HIGH (ref 8–23)
CO2: 35 mmol/L — ABNORMAL HIGH (ref 22–32)
Calcium: 9.4 mg/dL (ref 8.9–10.3)
Chloride: 101 mmol/L (ref 98–111)
Creatinine, Ser: 0.91 mg/dL (ref 0.61–1.24)
GFR calc Af Amer: >60 mL/min (ref >60)
GFR calc non Af Amer: >60 mL/min (ref >60)
Glucose, Bld: 237 mg/dL — ABNORMAL HIGH (ref 70–99)
Potassium: 5 mmol/L (ref 3.5–5.1)
Sodium: 146 mmol/L — ABNORMAL HIGH (ref 135–145)
Total Bilirubin: 0.5 mg/dL (ref 0.3–1.2)
Total Protein: 5.1 g/dL — ABNORMAL LOW (ref 6.5–8.1)

## 2019-11-15 LAB — CBC WITH DIFFERENTIAL/PLATELET
Abs Immature Granulocytes: 0.13 10*3/uL — ABNORMAL HIGH (ref 0.00–0.07)
Basophils Absolute: 0 10*3/uL (ref 0.0–0.1)
Basophils Relative: 0 %
Eosinophils Absolute: 0 10*3/uL (ref 0.0–0.5)
Eosinophils Relative: 1 %
HCT: 44.3 % (ref 39.0–52.0)
Hemoglobin: 13.6 g/dL (ref 13.0–17.0)
Immature Granulocytes: 2 %
Lymphocytes Relative: 5 %
Lymphs Abs: 0.4 10*3/uL — ABNORMAL LOW (ref 0.7–4.0)
MCH: 29.1 pg (ref 26.0–34.0)
MCHC: 30.7 g/dL (ref 30.0–36.0)
MCV: 94.9 fL (ref 80.0–100.0)
Monocytes Absolute: 0.1 10*3/uL (ref 0.1–1.0)
Monocytes Relative: 2 %
Neutro Abs: 7.3 10*3/uL (ref 1.7–7.7)
Neutrophils Relative %: 90 %
Platelets: 244 10*3/uL (ref 150–400)
RBC: 4.67 MIL/uL (ref 4.22–5.81)
RDW: 12.8 % (ref 11.5–15.5)
WBC: 8 10*3/uL (ref 4.0–10.5)
nRBC: 0 % (ref 0.0–0.2)

## 2019-11-15 LAB — GLUCOSE, CAPILLARY
Glucose-Capillary: 217 mg/dL — ABNORMAL HIGH (ref 70–99)
Glucose-Capillary: 177 mg/dL — ABNORMAL HIGH (ref 70–99)
Glucose-Capillary: 225 mg/dL — ABNORMAL HIGH (ref 70–99)
Glucose-Capillary: 186 mg/dL — ABNORMAL HIGH (ref 70–99)
Glucose-Capillary: 162 mg/dL — ABNORMAL HIGH (ref 70–99)
Glucose-Capillary: 218 mg/dL — ABNORMAL HIGH (ref 70–99)

## 2019-11-15 LAB — TRIGLYCERIDES: Triglycerides: 179 mg/dL — ABNORMAL HIGH (ref <150)

## 2019-11-15 LAB — MAGNESIUM: Magnesium: 2.4 mg/dL (ref 1.7–2.4)

## 2019-11-15 LAB — D-DIMER, QUANTITATIVE: D-Dimer, Quant: 3.07 ug/mL-FEU — ABNORMAL HIGH (ref 0.00–0.50)

## 2019-11-15 MED ORDER — INSULIN GLARGINE 100 UNIT/ML ~~LOC~~ SOLN
25.0000 [IU] | Freq: Every day | SUBCUTANEOUS | Status: DC
  Administered 2019-11-15 – 2019-11-19 (×5): 25 [IU] via SUBCUTANEOUS
  Filled 2019-11-15 (×5): qty 0.25

## 2019-11-15 MED ORDER — INSULIN GLARGINE 100 UNIT/ML ~~LOC~~ SOLN
30.0000 [IU] | Freq: Every day | SUBCUTANEOUS | Status: DC
  Administered 2019-11-15 – 2019-11-18 (×4): 30 [IU] via SUBCUTANEOUS
  Filled 2019-11-15 (×5): qty 0.3

## 2019-11-15 NOTE — Progress Notes (Signed)
NAME:  [name], MRN:  [id number], DOB:  20-Nov-1955, LOS: 7 ADMISSION DATE:  11/27/2019, CONSULTATION DATE:  11/11/19 REFERRING MD:  Jonetta Osgood, MD, CHIEF COMPLAINT:  SHOB   Brief History    Admitted with Covid pneumonia 4/2 and intubated for hypoxic delirium 4/5 History of feeling unwell for about a week prior to presentation Decompensated about 24 hours following his positive test  Past Medical History   CAD s/p PCI/CABG, bronchial asthma, DM-2, LD, HTN-  Significant Hospital Events   4/2>> admit to [facility] for worsening hypoxemia 4/3>> worsening hypoxemia-on 15 L HFNC and NRB 4/3>>Lower ext Doppler neg for DVT 4/4>>worsening confusion-pulling NRB-transfer to ICU 4/5 confused and delirious, intermittent desaturation when he takes nonrebreather off intubated, paralyzed & prone 4/9 has not needed proning off over 48 hours, oxygen requirement improving  Consults:  PCCM  Procedures:  ETT 4/5 >> RIJ 4/5 >>  Significant Diagnostic Tests:  11/10/19 CXR >> bilateral infiltrates R > L 4/7 chest x-ray >>worsening aeration at the bases-reviewed by myself Micro Data:  4/2: Blood cultures>> neg 4/5 resp >>  Antimicrobials:  Rocephin: 4/2-4/6 Zithromax: 4/3-4/5   Interim history/subjective:   Sedated fentanyl and propofol, critically ill, intubated.   Oxygen requirement appears to be improving  Objective   Blood pressure 110/70, pulse 81, temperature 98.4 F (36.9 C), temperature source Axillary, resp. rate (!) 25, height 5\' 8"  (1.727 m), weight 82.6 kg, SpO2 100 %.    Vent Mode: PRVC FiO2 (%):  [40 %-50 %] 40 % Set Rate:  [24 bmp] 24 bmp Vt Set:  [480 mL] 480 mL PEEP:  [12 cmH20-14 cmH20] 12 cmH20 Plateau Pressure:  [12 cmH20-28 cmH20] 12 cmH20   Intake/Output Summary (Last 24 hours) at 11/15/2019 1056 Last data filed at 11/15/2019 0700 Gross per 24 hour  Intake 1154.94 ml  Output 1910 ml  Net -755.06 ml   Filed Weights   11/13/19 0500 11/14/19 0500 11/15/19 0435   Weight: 75.5 kg 81.2 kg 82.6 kg   Examination: General: Elderly male, does not appear to be in distress HENT: Moist oral mucosa Pulm: Fair air entry at the bases, clear CV: S1-S2 appreciated Abdomen: Bowel sounds appreciated Extremities: Pulses palpable in all extremities, no LE edema  Skin: Cool and dry  Neuro: Sedated  Chest x-ray 4/7 personally reviewed bilateral infiltrates, ET tube appropriately placed ABG shows mild respiratory acidosis and severe hypoxia Labs today 11/15/2019 reviewed by myself-within normal limits  Resolved Hospital Problem list   N/A  Assessment & Plan:   ARDS 2/2 COVID-19 PNA  Asthma  Continue mechanical ventilation per ARDS protocol Target TVol 6-8cc/kgIBW Target Plateau Pressure < 30cm H20 Target driving pressure less than 15 cm of water Target PaO2 55-65: titrate PEEP/FiO2 per protocol Albuterol as needed  COVID pneumonia  -Status post Actemra -Solu-Medrol -Remdesivir  Acute Encephalopathy  -Heavy alcohol user - Continue propofol/fentanyl - Continue clonazepam  AKI  - Baseline creatinine 0.9, up to 1.5 on admission  -Avoid nephrotoxic's -Trend electrolytes  DM - CBG goal < 180  - Continue Lantus 25 units BID, Novolog 8 units TID WC + SSI -dc D10, start TFs  Patient did well overnight Not requiring proning, her oxygen requirement appears to be improving We will decrease sedation today and assess  Trending in the right direction Hopefully [place] be able to be extubated within 48 hours  Best practice:  Diet: tube feeds Pain/Anxiety/Delirium protocol (if indicated): Propofol/fent   VAP protocol (if indicated): N/A DVT prophylaxis:  Lovenox GI prophylaxis: Protonix Glucose control: SSI Mobility: Bed rest Code Status: Full code Family Communication: Will update Disposition: ICU  The patient is critically ill with multiple organ systems failure and requires high complexity decision making for assessment and support, frequent  evaluation and titration of therapies, application of advanced monitoring technologies and extensive interpretation of multiple databases. Critical Care Time devoted to patient care services described in this note independent of APP/resident time (if applicable)  is 32 minutes.   Virl Diamond MD [place] [facility] Personal pager: [phone] If unanswered, please page CCM On-call: [phone]

## 2019-11-15 NOTE — Progress Notes (Signed)
eLink Physician-Brief Progress Note Patient Name: [name] DOB: 06/25/1956 MRN: [id number]   Date of Service  11/15/2019  HPI/Events of Note    eICU Interventions  Morning labs ordered.        Thomasene Lot Bobie Kistler 11/15/2019, 4:23 AM

## 2019-11-15 NOTE — Progress Notes (Signed)
Wife was updated on the phone about plan of care and current status. All questions were answered at this time.

## 2019-11-16 ENCOUNTER — Inpatient Hospital Stay (HOSPITAL_COMMUNITY): Payer: Medicare Other

## 2019-11-16 LAB — CBC WITH DIFFERENTIAL/PLATELET
Abs Immature Granulocytes: 0.17 10*3/uL — ABNORMAL HIGH (ref 0.00–0.07)
Basophils Absolute: 0 10*3/uL (ref 0.0–0.1)
Basophils Relative: 0 %
Eosinophils Absolute: 0 10*3/uL (ref 0.0–0.5)
Eosinophils Relative: 0 %
HCT: 42.7 % (ref 39.0–52.0)
Hemoglobin: 13.4 g/dL (ref 13.0–17.0)
Immature Granulocytes: 2 %
Lymphocytes Relative: 7 %
Lymphs Abs: 0.6 10*3/uL — ABNORMAL LOW (ref 0.7–4.0)
MCH: 29.1 pg (ref 26.0–34.0)
MCHC: 31.4 g/dL (ref 30.0–36.0)
MCV: 92.8 fL (ref 80.0–100.0)
Monocytes Absolute: 0.2 10*3/uL (ref 0.1–1.0)
Monocytes Relative: 2 %
Neutro Abs: 8.5 10*3/uL — ABNORMAL HIGH (ref 1.7–7.7)
Neutrophils Relative %: 89 %
Platelets: 211 10*3/uL (ref 150–400)
RBC: 4.6 MIL/uL (ref 4.22–5.81)
RDW: 12.5 % (ref 11.5–15.5)
WBC: 9.5 10*3/uL (ref 4.0–10.5)
nRBC: 0 % (ref 0.0–0.2)

## 2019-11-16 LAB — BASIC METABOLIC PANEL
Anion gap: 8 (ref 5–15)
BUN: 46 mg/dL — ABNORMAL HIGH (ref 8–23)
CO2: 35 mmol/L — ABNORMAL HIGH (ref 22–32)
Calcium: 9.1 mg/dL (ref 8.9–10.3)
Chloride: 100 mmol/L (ref 98–111)
Creatinine, Ser: 0.83 mg/dL (ref 0.61–1.24)
GFR calc Af Amer: >60 mL/min (ref >60)
GFR calc non Af Amer: >60 mL/min (ref >60)
Glucose, Bld: 203 mg/dL — ABNORMAL HIGH (ref 70–99)
Potassium: 4.8 mmol/L (ref 3.5–5.1)
Sodium: 143 mmol/L (ref 135–145)

## 2019-11-16 LAB — GLUCOSE, CAPILLARY
Glucose-Capillary: 190 mg/dL — ABNORMAL HIGH (ref 70–99)
Glucose-Capillary: 154 mg/dL — ABNORMAL HIGH (ref 70–99)
Glucose-Capillary: 162 mg/dL — ABNORMAL HIGH (ref 70–99)
Glucose-Capillary: 161 mg/dL — ABNORMAL HIGH (ref 70–99)
Glucose-Capillary: 129 mg/dL — ABNORMAL HIGH (ref 70–99)
Glucose-Capillary: 154 mg/dL — ABNORMAL HIGH (ref 70–99)

## 2019-11-16 LAB — BRAIN NATRIURETIC PEPTIDE: B Natriuretic Peptide: 88.4 pg/mL (ref 0.0–100.0)

## 2019-11-16 LAB — TRIGLYCERIDES: Triglycerides: 156 mg/dL — ABNORMAL HIGH (ref <150)

## 2019-11-16 MED ORDER — FUROSEMIDE 10 MG/ML IJ SOLN
20.0000 mg | Freq: Once | INTRAMUSCULAR | Status: AC
  Administered 2019-11-16: 20 mg via INTRAVENOUS
  Filled 2019-11-16: qty 2

## 2019-11-16 MED ORDER — TRAZODONE HCL 50 MG PO TABS
100.0000 mg | ORAL_TABLET | Freq: Every day | ORAL | Status: DC
  Administered 2019-11-16 – 2019-12-06 (×21): 100 mg
  Filled 2019-11-16 (×2): qty 2
  Filled 2019-11-16: qty 1
  Filled 2019-11-16: qty 2
  Filled 2019-11-16 (×6): qty 1
  Filled 2019-11-16: qty 2
  Filled 2019-11-16 (×2): qty 1
  Filled 2019-11-16: qty 2
  Filled 2019-11-16 (×2): qty 1
  Filled 2019-11-16 (×2): qty 2
  Filled 2019-11-16 (×2): qty 1
  Filled 2019-11-16: qty 2
  Filled 2019-11-16: qty 1

## 2019-11-16 NOTE — Progress Notes (Signed)
At 2050 patient began to have vent dysynchrony with decreased o2 sats at 84-86%. Assessed RASS, RASS -4, gave additional fentanyl bolus before giving PRN Vecuronium. Minimal improvement in sats after vecuronium, patient became tachycardic as high as 122bpm and hypertensive. Patient suctioned with small amount cream thick secretions out of tube. E-link called 2139, increased propofol and fentanyl continuous doses. Called Respiratory therapist, per RT increased fio2 to 70%. PRN Versed given, following versed administration patient tachycardia now 108-112bpm. Sats now 90-92% on 70% fio2. Awaiting new orders from e-link physician.

## 2019-11-16 NOTE — Progress Notes (Signed)
eLink Physician-Brief Progress Note Patient Name: [name] DOB: Nov 07, 1955 MRN: [id number]   Date of Service  11/16/2019  HPI/Events of Note  Ventilator dyssynchrony, Sats 88-91 %  eICU Interventions  Fentanyl infusion rate ceiling increased to 400 mcg, Will check BNP  And CVP as proxy for volume status to determine if patient will benefit from gentle diuresis.        [name] 11/16/2019, 10:13 PM

## 2019-11-16 NOTE — Progress Notes (Signed)
NAME:  [name], MRN:  [id number], DOB:  08/18/1955, LOS: 8 ADMISSION DATE:  11/18/2019, CONSULTATION DATE:  11/11/19 REFERRING MD:  Maretta Bees, MD, CHIEF COMPLAINT:  SHOB   Brief History    Admitted with Covid pneumonia 4/2 and intubated for hypoxic delirium 4/5 History of feeling unwell for about a week prior to presentation Decompensated about 24 hours following his positive test  Past Medical History   CAD s/p PCI/CABG, bronchial asthma, DM-2, LD, HTN-  Significant Hospital Events   4/2>> admit to [facility] for worsening hypoxemia 4/3>> worsening hypoxemia-on 15 L HFNC and NRB 4/3>>Lower ext Doppler neg for DVT 4/4>>worsening confusion-pulling NRB-transfer to ICU 4/5 confused and delirious, intermittent desaturation when he takes nonrebreather off intubated, paralyzed & prone 4/9 has not needed proning off over 48 hours, oxygen requirement improving  Consults:  PCCM  Procedures:  ETT 4/5 >> RIJ 4/5 >>  Significant Diagnostic Tests:  11/10/19 CXR >> bilateral infiltrates R > L 4/7 chest x-ray >>worsening aeration at the bases-reviewed by myself Micro Data:  4/2: Blood cultures>> neg 4/5 resp >>  Antimicrobials:  Rocephin: 4/2-4/6 Zithromax: 4/3-4/5   Interim history/subjective:   Sedated fentanyl and propofol, critically ill, intubated.   Oxygen requirement appears to be improving Was doing well-episodes of breath stacking Received 1 dose of paralytic-ventilator dyssynchrony-improved with trigger change  Objective   Blood pressure 97/69, pulse (!) 103, temperature (P) 98.6 F (37 C), temperature source (P) Axillary, resp. rate 15, height 5\' 8"  (1.727 m), weight 83.5 kg, SpO2 96 %.    Vent Mode: PRVC FiO2 (%):  [40 %-50 %] (P) 50 % Set Rate:  [24 bmp] 24 bmp Vt Set:  [480 mL] 480 mL PEEP:  [10 cmH20-12 cmH20] 10 cmH20 Plateau Pressure:  [12 cmH20-28 cmH20] 24 cmH20   Intake/Output Summary (Last 24 hours) at 11/16/2019 1005 Last data filed at 11/16/2019  0700 Gross per 24 hour  Intake 1135.12 ml  Output 1765 ml  Net -629.88 ml   Filed Weights   11/14/19 0500 11/15/19 0435 11/16/19 0418  Weight: 81.2 kg 82.6 kg 83.5 kg   Examination: General: Elderly male, does not appear to be in distress HENT: Moist oral mucosa Pulm: Fair air entry at the bases, clear CV: S1-S2 appreciated Abdomen: BS Appreciated Extremities: Pulses palpable in all extremities, no LE edema  Skin: Cool and dry  Neuro: Sedated  Chest x-ray 4/10 personally reviewed with improving bilateral infiltrates, ET tube appropriately placed ABG shows mild respiratory acidosis and severe hypoxia Labs today 11/16/2019 reviewed by myself-within normal limits  Resolved Hospital Problem list   N/A  Assessment & Plan:   ARDS 2/2 COVID-19 PNA  Asthma  Continue mechanical ventilation per ARDS protocol Target TVol 6-8cc/kgIBW Target Plateau Pressure < 30cm H20 Target driving pressure less than 15 cm of water Target PaO2 55-65: titrate PEEP/FiO2 per protocol Albuterol as needed  COVID pneumonia  -Status post Actemra -Solu-Medrol -Remdesivir  Acute Encephalopathy  -Heavy alcohol user - Continue propofol/fentanyl - Continue clonazepam -Decrease nightly trazodone 200 from 150  AKI  - Baseline creatinine 0.9, up to 1.5 on admission  -Avoid nephrotoxic's -Trend electrolytes  DM - CBG goal < 180  - Continue Lantus 25 units BID, Novolog 8 units TID WC + SSI -dc D10, start TFs  Patient did well overnight Not requiring proning, oxygen requirement is improving Issues with breath stacking this morning resolved with ventilator changes Did receive 1 dose of paralytic We will decrease sedation today and  assess  Trending in the right direction Hopefully [place] be able to be extubated within 48 hours  Best practice:  Diet: tube feeds Pain/Anxiety/Delirium protocol (if indicated): Propofol/fent   VAP protocol (if indicated): N/A DVT prophylaxis: Lovenox GI prophylaxis:  Protonix Glucose control: SSI Mobility: Bed rest Code Status: Full code Family Communication: Will update Disposition: ICU  The patient is critically ill with multiple organ systems failure and requires high complexity decision making for assessment and support, frequent evaluation and titration of therapies, application of advanced monitoring technologies and extensive interpretation of multiple databases. Critical Care Time devoted to patient care services described in this note independent of APP/resident time (if applicable)  is 35 minutes.   Sherrilyn Rist MD [place] [facility] Personal pager: [phone] If unanswered, please page CCM On-call: [phone]

## 2019-11-16 NOTE — Progress Notes (Signed)
During Routine Vent check, This RT was moving the et and a small abrasion was noted on bottom center lip. ET will only be moved to right and left to avoid making it worse. RN aware

## 2019-11-16 NOTE — Progress Notes (Signed)
eLink Physician-Brief Progress Note Patient Name: [name] DOB: 1956-07-21 MRN: [id number]   Date of Service  11/16/2019  HPI/Events of Note  Saturation in upper 80's, CVP 11  eICU Interventions  Lasix 20 mg iv x 1, increase PEEP to 12, ABG at 12:15 a.m.        [name] [name] 11/16/2019, 10:49 PM

## 2019-11-17 LAB — CBC WITH DIFFERENTIAL/PLATELET
Abs Immature Granulocytes: 0.21 10*3/uL — ABNORMAL HIGH (ref 0.00–0.07)
Basophils Absolute: 0 10*3/uL (ref 0.0–0.1)
Basophils Relative: 0 %
Eosinophils Absolute: 0 10*3/uL (ref 0.0–0.5)
Eosinophils Relative: 0 %
HCT: 45.3 % (ref 39.0–52.0)
Hemoglobin: 14.3 g/dL (ref 13.0–17.0)
Immature Granulocytes: 2 %
Lymphocytes Relative: 8 %
Lymphs Abs: 0.9 10*3/uL (ref 0.7–4.0)
MCH: 29.2 pg (ref 26.0–34.0)
MCHC: 31.6 g/dL (ref 30.0–36.0)
MCV: 92.4 fL (ref 80.0–100.0)
Monocytes Absolute: 0.5 10*3/uL (ref 0.1–1.0)
Monocytes Relative: 4 %
Neutro Abs: 10.6 10*3/uL — ABNORMAL HIGH (ref 1.7–7.7)
Neutrophils Relative %: 86 %
Platelets: 222 10*3/uL (ref 150–400)
RBC: 4.9 MIL/uL (ref 4.22–5.81)
RDW: 12.6 % (ref 11.5–15.5)
WBC: 12.2 10*3/uL — ABNORMAL HIGH (ref 4.0–10.5)
nRBC: 0 % (ref 0.0–0.2)

## 2019-11-17 LAB — POCT I-STAT 7, (LYTES, BLD GAS, ICA,H+H)
Acid-Base Excess: 11 mmol/L — ABNORMAL HIGH (ref 0.0–2.0)
Acid-Base Excess: 11 mmol/L — ABNORMAL HIGH (ref 0.0–2.0)
Bicarbonate: 37.2 mmol/L — ABNORMAL HIGH (ref 20.0–28.0)
Bicarbonate: 37.3 mmol/L — ABNORMAL HIGH (ref 20.0–28.0)
Calcium, Ion: 1.26 mmol/L (ref 1.15–1.40)
Calcium, Ion: 1.26 mmol/L (ref 1.15–1.40)
HCT: 40 % (ref 39.0–52.0)
HCT: 41 % (ref 39.0–52.0)
Hemoglobin: 13.6 g/dL (ref 13.0–17.0)
Hemoglobin: 13.9 g/dL (ref 13.0–17.0)
O2 Saturation: 97 %
O2 Saturation: 98 %
Patient temperature: 98.6
Patient temperature: 99.7
Potassium: 4.6 mmol/L (ref 3.5–5.1)
Potassium: 5.2 mmol/L — ABNORMAL HIGH (ref 3.5–5.1)
Sodium: 138 mmol/L (ref 135–145)
Sodium: 138 mmol/L (ref 135–145)
TCO2: 39 mmol/L — ABNORMAL HIGH (ref 22–32)
TCO2: 39 mmol/L — ABNORMAL HIGH (ref 22–32)
pCO2 arterial: 54 mmHg — ABNORMAL HIGH (ref 32.0–48.0)
pCO2 arterial: 56.6 mmHg — ABNORMAL HIGH (ref 32.0–48.0)
pH, Arterial: 7.427 (ref 7.350–7.450)
pH, Arterial: 7.448 (ref 7.350–7.450)
pO2, Arterial: 105 mmHg (ref 83.0–108.0)
pO2, Arterial: 90 mmHg (ref 83.0–108.0)

## 2019-11-17 LAB — COMPREHENSIVE METABOLIC PANEL
ALT: 31 U/L (ref 0–44)
AST: 44 U/L — ABNORMAL HIGH (ref 15–41)
Albumin: 2.3 g/dL — ABNORMAL LOW (ref 3.5–5.0)
Alkaline Phosphatase: 68 U/L (ref 38–126)
Anion gap: 8 (ref 5–15)
BUN: 45 mg/dL — ABNORMAL HIGH (ref 8–23)
CO2: 35 mmol/L — ABNORMAL HIGH (ref 22–32)
Calcium: 8.7 mg/dL — ABNORMAL LOW (ref 8.9–10.3)
Chloride: 98 mmol/L (ref 98–111)
Creatinine, Ser: 0.79 mg/dL (ref 0.61–1.24)
GFR calc Af Amer: >60 mL/min (ref >60)
GFR calc non Af Amer: >60 mL/min (ref >60)
Glucose, Bld: 172 mg/dL — ABNORMAL HIGH (ref 70–99)
Potassium: 4.6 mmol/L (ref 3.5–5.1)
Sodium: 141 mmol/L (ref 135–145)
Total Bilirubin: 0.7 mg/dL (ref 0.3–1.2)
Total Protein: 5.3 g/dL — ABNORMAL LOW (ref 6.5–8.1)

## 2019-11-17 LAB — TRIGLYCERIDES: Triglycerides: 167 mg/dL — ABNORMAL HIGH (ref <150)

## 2019-11-17 LAB — MAGNESIUM: Magnesium: 2.2 mg/dL (ref 1.7–2.4)

## 2019-11-17 LAB — GLUCOSE, CAPILLARY
Glucose-Capillary: 182 mg/dL — ABNORMAL HIGH (ref 70–99)
Glucose-Capillary: 134 mg/dL — ABNORMAL HIGH (ref 70–99)
Glucose-Capillary: 183 mg/dL — ABNORMAL HIGH (ref 70–99)
Glucose-Capillary: 207 mg/dL — ABNORMAL HIGH (ref 70–99)
Glucose-Capillary: 134 mg/dL — ABNORMAL HIGH (ref 70–99)
Glucose-Capillary: 202 mg/dL — ABNORMAL HIGH (ref 70–99)

## 2019-11-17 MED ORDER — METOPROLOL TARTRATE 5 MG/5ML IV SOLN
INTRAVENOUS | Status: AC
  Filled 2019-11-17: qty 5

## 2019-11-17 MED ORDER — METOPROLOL TARTRATE 5 MG/5ML IV SOLN
5.0000 mg | Freq: Once | INTRAVENOUS | Status: AC
  Administered 2019-11-17: 5 mg via INTRAVENOUS

## 2019-11-17 MED ORDER — AMIODARONE IV BOLUS ONLY 150 MG/100ML: 150.0000 mg | Freq: Once | INTRAVENOUS | Status: DC

## 2019-11-17 NOTE — Progress Notes (Signed)
Per e-link decreased propofol from 50 to 40 due to RASS -5, waiting to assess change of RASS with decreased sedated.

## 2019-11-17 NOTE — Progress Notes (Signed)
eLink Physician-Brief Progress Note Patient Name: [name] DOB: 11/24/1955 MRN: [id number]   Date of Service  11/17/2019  HPI/Events of Note  7.42/ 56.6/ 105/ 37.3/ 98 % Saturation significantly higher on ABG compared with pulse oximeter.  eICU Interventions  Wean PEEP then FIO2        [name] 11/17/2019, 12:27 AM

## 2019-11-17 NOTE — Progress Notes (Signed)
Lavaged pt with 20 cc saline with copious amounts of yellow/tan secretions being suctioned out. Pt peak pressure decreased from 54 to 25. RN at bedside and aware.

## 2019-11-17 NOTE — Progress Notes (Signed)
NAME:  [name], MRN:  [id number], DOB:  06-19-1956, LOS: 9 ADMISSION DATE:  11/20/2019, CONSULTATION DATE:  11/11/19 REFERRING MD:  Jonetta Osgood, MD, CHIEF COMPLAINT:  SHOB   Brief History    Admitted with Covid pneumonia 4/2 and intubated for hypoxic delirium 4/5 History of feeling unwell for about a week prior to presentation Decompensated about 24 hours following his positive test  Past Medical History   CAD s/p PCI/CABG, bronchial asthma, DM-2, LD, HTN  Significant Hospital Events   4/2>> admit to [facility] for worsening hypoxemia 4/3>> worsening hypoxemia-on 15 L HFNC and NRB 4/3>>Lower ext Doppler neg for DVT 4/4>>worsening confusion-pulling NRB-transfer to ICU 4/5 confused and delirious, intermittent desaturation when he takes nonrebreather off intubated, paralyzed & prone 4/9 has not needed proning off over 48 hours, oxygen requirement improving  Consults:  PCCM  Procedures:  ETT 4/5 >> RIJ 4/5 >>  Significant Diagnostic Tests:  11/10/19 CXR >> bilateral infiltrates R > L 4/7 chest x-ray >>worsening aeration at the bases-reviewed by myself Micro Data:  4/2: Blood cultures>> neg 4/5 resp >>  Antimicrobials:  Rocephin: 4/2-4/6 Zithromax: 4/3-4/5   Interim history/subjective:   Sedated fentanyl and propofol, critically ill, intubated.  Did have significant vent dyssynchrony, breath stacking requiring paralytics Oxygen requirements marginally better  Objective   Blood pressure 112/81, pulse 95, temperature 98.7 F (37.1 C), temperature source Axillary, resp. rate 14, height 5\' 8"  (1.727 m), weight 83 kg, SpO2 96 %. CVP:  [7 mmHg-11 mmHg] 7 mmHg  Vent Mode: PRVC FiO2 (%):  [50 %-70 %] 60 % Set Rate:  [18 bmp-20 bmp] 20 bmp Vt Set:  [480 mL] 480 mL PEEP:  [10 cmH20-16 cmH20] 16 cmH20 Plateau Pressure:  [21 cmH20-28 cmH20] 27 cmH20   Intake/Output Summary (Last 24 hours) at 11/17/2019 1122 Last data filed at 11/17/2019 1000 Gross per 24 hour  Intake 1429.64  ml  Output 2595 ml  Net -1165.36 ml   Filed Weights   11/15/19 0435 11/16/19 0418 11/17/19 0500  Weight: 82.6 kg 83.5 kg 83 kg   Examination: General: Elderly male, does not appear to be in distress HENT: Moist oral mucosa Pulm: Fair air entry at the bases, clear CV: S1-S2 appreciated Abdomen: Bowel sounds appreciated Extremities: Pulses palpable in all extremities, no LE edema  Skin: Cool and dry  Neuro: Sedated  ABG 7.4 4/54/90 Labs-stable  Chest x-ray 4/10 personally reviewed with improving bilateral infiltrates, ET tube appropriately placed  Resolved Hospital Problem list   N/A  Assessment & Plan:   ARDS 2/2 COVID-19 PNA  Asthma  Continue mechanical ventilation per ARDS protocol Target TVol 6-8cc/kgIBW Target Plateau Pressure < 30cm H20 Target driving pressure less than 15 cm of water Target PaO2 55-65: titrate PEEP/FiO2 per protocol Albuterol as needed  COVID pneumonia  -Status post Actemra -Solu-Medrol -Remdesivir  Acute Encephalopathy  -Heavy alcohol user - Continue propofol/fentanyl - Continue clonazepam -Decreased nightly trazodone 200 from 150  AKI  - Baseline creatinine 0.9, up to 1.5 on admission  -Avoid nephrotoxic's -Trend electrolytes  DM - CBG goal < 180  - Continue Lantus 25 units BID, Novolog 8 units TID WC + SSI   Continue to wean as tolerated Not requiring proning Oxygen requirement slightly better Still requiring sedation and as needed paralytics  Trending in the right direction  Best practice:  Diet: tube feeds Pain/Anxiety/Delirium protocol (if indicated): Propofol/fent   VAP protocol (if indicated): N/A DVT prophylaxis: Lovenox GI prophylaxis: Protonix Glucose control: SSI  Mobility: Bed rest Code Status: Full code Family Communication: Will update Disposition: ICU  The patient is critically ill with multiple organ systems failure and requires high complexity decision making for assessment and support, frequent  evaluation and titration of therapies, application of advanced monitoring technologies and extensive interpretation of multiple databases. Critical Care Time devoted to patient care services described in this note independent of APP/resident time (if applicable)  is 35 minutes.   Virl Diamond MD [place] [facility] Personal pager: [phone] If unanswered, please page CCM On-call: [phone]

## 2019-11-17 NOTE — Progress Notes (Signed)
eLink Physician-Brief Progress Note Patient Name: [name] DOB: 07/25/1956 MRN: [id number]   Date of Service  11/17/2019  HPI/Events of Note  Pt needs soft restraints order for safety to prevent self-extubation.  eICU Interventions  Bilateral soft wrist restraints ordered.        Migdalia Dk 11/17/2019, 6:34 AM

## 2019-11-17 NOTE — Progress Notes (Signed)
eLink Physician-Brief Progress Note Patient Name: [name] DOB: 1955/08/22 MRN: [id number]   Date of Service  11/17/2019  HPI/Events of Note  Pt needs a.m. labs  eICU Interventions  CBC, CMP, Mg ++, ABG, ordered.        Thomasene Lot Trellis Vanoverbeke 11/17/2019, 3:23 AM

## 2019-11-17 NOTE — Progress Notes (Signed)
Called in ABG results to [place], Charity fundraiser at [employer].

## 2019-11-17 NOTE — Progress Notes (Signed)
Pt with high peak pressures, lavaged pt but no secretions noted in suction. MD aware

## 2019-11-18 LAB — GLUCOSE, CAPILLARY
Glucose-Capillary: 182 mg/dL — ABNORMAL HIGH (ref 70–99)
Glucose-Capillary: 162 mg/dL — ABNORMAL HIGH (ref 70–99)
Glucose-Capillary: 163 mg/dL — ABNORMAL HIGH (ref 70–99)
Glucose-Capillary: 194 mg/dL — ABNORMAL HIGH (ref 70–99)
Glucose-Capillary: 185 mg/dL — ABNORMAL HIGH (ref 70–99)
Glucose-Capillary: 158 mg/dL — ABNORMAL HIGH (ref 70–99)

## 2019-11-18 LAB — TRIGLYCERIDES: Triglycerides: 158 mg/dL — ABNORMAL HIGH (ref <150)

## 2019-11-18 MED ORDER — SODIUM CHLORIDE 0.9% FLUSH
10.0000 mL | Freq: Two times a day (BID) | INTRAVENOUS | Status: DC
  Administered 2019-11-18 – 2019-11-28 (×21): 10 mL
  Administered 2019-11-29: 30 mL
  Administered 2019-11-29 – 2019-11-30 (×2): 10 mL

## 2019-11-18 MED ORDER — ACETAZOLAMIDE 250 MG PO TABS
500.0000 mg | ORAL_TABLET | Freq: Once | ORAL | Status: AC
  Administered 2019-11-18: 500 mg
  Filled 2019-11-18: qty 2

## 2019-11-18 MED ORDER — SODIUM CHLORIDE 0.9% FLUSH
10.0000 mL | INTRAVENOUS | Status: DC | PRN
  Administered 2019-11-18: 10 mL

## 2019-11-18 NOTE — Progress Notes (Signed)
Called to update spouse  Left a message on her voicemail

## 2019-11-18 NOTE — Progress Notes (Signed)
NAME:  [name], MRN:  [id number], DOB:  [birth year], LOS: 10 ADMISSION DATE:  11-22-2019, CONSULTATION DATE:  11/11/19 REFERRING MD:  Jonetta Osgood, MD, CHIEF COMPLAINT:  SHOB   Brief History    Admitted with Covid pneumonia 4/2 and intubated for hypoxic delirium 4/5 History of feeling unwell for about a week prior to presentation Decompensated about 24 hours following his positive test  Past Medical History   CAD s/p PCI/CABG, bronchial asthma, DM-2, LD, HTN  Significant Hospital Events   4/2>> admit to [facility] for worsening hypoxemia 4/3>> worsening hypoxemia-on 15 L HFNC and NRB 4/3>>Lower ext Doppler neg for DVT 4/4>>worsening confusion-pulling NRB-transfer to ICU 4/5 confused and delirious, intermittent desaturation when he takes nonrebreather off intubated, paralyzed & prone 4/9 has not needed proning off over 48 hours, oxygen requirement improving  Consults:  PCCM  Procedures:  ETT 4/5 >> RIJ 4/5 >>  Significant Diagnostic Tests:  11/10/19 CXR >> bilateral infiltrates R > L 4/7 chest x-ray >>worsening aeration at the bases-reviewed by myself Micro Data:  4/2: Blood cultures>> neg 4/5 resp >>  Antimicrobials:  Rocephin: 4/2-4/6 Zithromax: 4/3-4/5   Interim history/subjective:   Sedated fentanyl and propofol, critically ill, intubated.  Did have significant vent dyssynchrony, breath stacking requiring paralytics Had to be suctioned multiple times for secretions-possible plugs Oxygen requirements marginally better  Objective   Blood pressure 102/66, pulse 81, temperature 98.3 F (36.8 C), temperature source Oral, resp. rate 19, height 5\' 8"  (1.727 m), weight 81.5 kg, SpO2 93 %.    Vent Mode: PRVC FiO2 (%):  [50 %-60 %] 60 % Set Rate:  [20 bmp] 20 bmp Vt Set:  [480 mL] 480 mL PEEP:  [12 cmH20-14 cmH20] 12 cmH20 Plateau Pressure:  [18 cmH20-32 cmH20] 26 cmH20   Intake/Output Summary (Last 24 hours) at 11/18/2019 0955 Last data filed at 11/18/2019 0900 Gross  per 24 hour  Intake 1642.6 ml  Output 1925 ml  Net -282.4 ml   Filed Weights   11/16/19 0418 11/17/19 0500 11/18/19 0500  Weight: 83.5 kg 83 kg 81.5 kg   Examination: General: Elderly male, does not appear to be in distress HENT: Moist oral mucosa Pulm: Fair air entry, clear at the bases CV: S1-S2 appreciated Abdomen: Bowel sounds appreciated Extremities: Pulses palpable in all extremities Skin: Cool and dry  Neuro: Sedated  ABG 7.4 4/54/90 4/11 Labs-stable-some contraction alkalosis  Chest x-ray 4/10 personally reviewed with improving bilateral infiltrates, ET tube appropriately placed  Resolved Hospital Problem list   N/A  Assessment & Plan:   ARDS 2/2 COVID-19 PNA  Asthma  Continue mechanical ventilation per ARDS protocol Target TVol 6-8cc/kgIBW Target Plateau Pressure < 30cm H20 Target driving pressure less than 15 cm of water Target PaO2 55-65: titrate PEEP/FiO2 per protocol Albuterol as needed  COVID pneumonia  -Status post Actemra -Solu-Medrol -Remdesivir  Acute Encephalopathy  -Heavy alcohol user - Continue propofol/fentanyl - Continue clonazepam -Decreased nightly trazodone 100 from 150  AKI  - Baseline creatinine 0.79, up to 1.5 on admission  -Avoid nephrotoxic's -Trend electrolytes  DM - CBG goal < 180  - Continue Lantus 25 units BID, Novolog 8 units TID WC + SSI   Continue to wean as tolerated Not requiring proning Oxygen requirement slightly better Still requiring sedation and as needed paralytics  Trending in the right direction We will continue to aggressively wean  Best practice:  Diet: tube feeds Pain/Anxiety/Delirium protocol (if indicated): Propofol/fent   VAP protocol (if indicated): N/A DVT  prophylaxis: Lovenox GI prophylaxis: Protonix Glucose control: SSI Mobility: Bed rest Code Status: Full code Family Communication: Will update Disposition: ICU  The patient is critically ill with multiple organ systems failure and  requires high complexity decision making for assessment and support, frequent evaluation and titration of therapies, application of advanced monitoring technologies and extensive interpretation of multiple databases. Critical Care Time devoted to patient care services described in this note independent of APP/resident time (if applicable)  is 32 minutes.   Virl Diamond MD [place] [facility] Personal pager: [phone] If unanswered, please page CCM On-call: [phone]

## 2019-11-18 NOTE — Progress Notes (Signed)
eLink Physician-Brief Progress Note Patient Name: [name] DOB: 11-12-1955 MRN: [id number]   Date of Service  11/18/2019  HPI/Events of Note  Agitation - Request to renew bilateral soft wrist restraints.   eICU Interventions  Will order: 1. Bilateral soft wrist restraints X 10 hours.      Intervention Category Major Interventions: Delirium, psychosis, severe agitation - evaluation and management  Bibi Economos Eugene 11/18/2019, 10:09 PM

## 2019-11-19 ENCOUNTER — Inpatient Hospital Stay (HOSPITAL_COMMUNITY): Payer: Medicare Other

## 2019-11-19 LAB — CBC WITH DIFFERENTIAL/PLATELET
Abs Immature Granulocytes: 0.17 10*3/uL — ABNORMAL HIGH (ref 0.00–0.07)
Basophils Absolute: 0 10*3/uL (ref 0.0–0.1)
Basophils Relative: 0 %
Eosinophils Absolute: 0 10*3/uL (ref 0.0–0.5)
Eosinophils Relative: 0 %
HCT: 40.7 % (ref 39.0–52.0)
Hemoglobin: 12.6 g/dL — ABNORMAL LOW (ref 13.0–17.0)
Immature Granulocytes: 2 %
Lymphocytes Relative: 4 %
Lymphs Abs: 0.5 10*3/uL — ABNORMAL LOW (ref 0.7–4.0)
MCH: 29.2 pg (ref 26.0–34.0)
MCHC: 31 g/dL (ref 30.0–36.0)
MCV: 94.4 fL (ref 80.0–100.0)
Monocytes Absolute: 0.2 10*3/uL (ref 0.1–1.0)
Monocytes Relative: 2 %
Neutro Abs: 10.7 10*3/uL — ABNORMAL HIGH (ref 1.7–7.7)
Neutrophils Relative %: 92 %
Platelets: 150 10*3/uL (ref 150–400)
RBC: 4.31 MIL/uL (ref 4.22–5.81)
RDW: 12.4 % (ref 11.5–15.5)
WBC: 11.6 10*3/uL — ABNORMAL HIGH (ref 4.0–10.5)
nRBC: 0 % (ref 0.0–0.2)

## 2019-11-19 LAB — BASIC METABOLIC PANEL
Anion gap: 9 (ref 5–15)
BUN: 43 mg/dL — ABNORMAL HIGH (ref 8–23)
CO2: 29 mmol/L (ref 22–32)
Calcium: 8.5 mg/dL — ABNORMAL LOW (ref 8.9–10.3)
Chloride: 99 mmol/L (ref 98–111)
Creatinine, Ser: 0.73 mg/dL (ref 0.61–1.24)
GFR calc Af Amer: >60 mL/min (ref >60)
GFR calc non Af Amer: >60 mL/min (ref >60)
Glucose, Bld: 243 mg/dL — ABNORMAL HIGH (ref 70–99)
Potassium: 4.7 mmol/L (ref 3.5–5.1)
Sodium: 137 mmol/L (ref 135–145)

## 2019-11-19 LAB — GLUCOSE, CAPILLARY
Glucose-Capillary: 219 mg/dL — ABNORMAL HIGH (ref 70–99)
Glucose-Capillary: 187 mg/dL — ABNORMAL HIGH (ref 70–99)
Glucose-Capillary: 161 mg/dL — ABNORMAL HIGH (ref 70–99)
Glucose-Capillary: 212 mg/dL — ABNORMAL HIGH (ref 70–99)
Glucose-Capillary: 149 mg/dL — ABNORMAL HIGH (ref 70–99)
Glucose-Capillary: 137 mg/dL — ABNORMAL HIGH (ref 70–99)

## 2019-11-19 LAB — TRIGLYCERIDES: Triglycerides: 159 mg/dL — ABNORMAL HIGH (ref <150)

## 2019-11-19 MED ORDER — INSULIN GLARGINE 100 UNIT/ML ~~LOC~~ SOLN
30.0000 [IU] | Freq: Every day | SUBCUTANEOUS | Status: DC
  Filled 2019-11-19: qty 0.3

## 2019-11-19 MED ORDER — INSULIN GLARGINE 100 UNIT/ML ~~LOC~~ SOLN
5.0000 [IU] | Freq: Once | SUBCUTANEOUS | Status: AC
  Administered 2019-11-19: 5 [IU] via SUBCUTANEOUS
  Filled 2019-11-19: qty 0.05

## 2019-11-19 MED ORDER — INSULIN ASPART 100 UNIT/ML ~~LOC~~ SOLN
0.0000 [IU] | SUBCUTANEOUS | Status: DC
  Administered 2019-11-19 (×2): 3 [IU] via SUBCUTANEOUS
  Administered 2019-11-19: 7 [IU] via SUBCUTANEOUS
  Administered 2019-11-20: 04:00:00 3 [IU] via SUBCUTANEOUS

## 2019-11-19 MED ORDER — INSULIN GLARGINE 100 UNIT/ML ~~LOC~~ SOLN
30.0000 [IU] | Freq: Every day | SUBCUTANEOUS | Status: DC
  Administered 2019-11-19: 30 [IU] via SUBCUTANEOUS
  Filled 2019-11-19 (×2): qty 0.3

## 2019-11-19 MED ORDER — THIAMINE HCL 100 MG PO TABS
100.0000 mg | ORAL_TABLET | Freq: Every day | ORAL | Status: DC
  Administered 2019-11-19 – 2020-01-07 (×49): 100 mg
  Filled 2019-11-19 (×49): qty 1

## 2019-11-19 MED ORDER — INSULIN ASPART 100 UNIT/ML ~~LOC~~ SOLN
6.0000 [IU] | SUBCUTANEOUS | Status: DC
  Administered 2019-11-19 – 2019-11-20 (×7): 6 [IU] via SUBCUTANEOUS

## 2019-11-19 NOTE — Progress Notes (Signed)
   11/19/19 1530  Clinical Encounter Type  Visited With Patient  Visit Type Initial;Critical Care  Referral From Nurse  Consult/Referral To Chaplain  Spiritual Encounters  Spiritual Needs Prayer   Chaplain was rounding on the 31M unit and RN mentioned that Rayder would benefit from prayer. Chaplain visited with Vishruth and offered ministry of presence and prayer. Chaplain remains available for support as needs arise.   Chaplain Resident, Amado Coe, M Div [phone] on-call pager

## 2019-11-19 NOTE — Progress Notes (Signed)
Nutrition Follow-up  DOCUMENTATION CODES:   Not applicable  INTERVENTION:   Tube Feeding:  Continue Vital AF 1.2 at 40 ml/hr Pro-Stat 30 mL QID Provides 1552 kcals, 132 g of protein and 778 mL of free water  TF regimen and propofol at current rate providing 2000 total kcal/day   TF plus free water flushes provides: 1760 mL     NUTRITION DIAGNOSIS:   Inadequate oral intake related to inability to eat, acute illness as evidenced by NPO status.  Being addressed via TF   GOAL:   Patient will meet greater than or equal to 90% of their needs  Progressing  MONITOR:   Vent status, Labs, Weight trends, TF tolerance  REASON FOR ASSESSMENT:   Ventilator, Consult Enteral/tube feeding initiation and management  ASSESSMENT:   64 yo male with ARDS secondary to COVID-19 pnuemonia requiring intubation, acute encephalopathy with hx of heavy EtOH abuse, AKI. PMH includes DM, HTN, CAD s/p PCI/CABG  4/02 Admitted 4/05 Intubated, Paralyzed, Prone 4/06 TF initiated via OG tube 4/07 Cortrak  Pt remains on vent support; per MD notes, pt close to extubation, plan for SBT  Propofol: 17 ml/hr  Vital AF 1.2 at 40 ml/hr, Pro-Stat 30 mL QID, free water 200 mL q 6 hours  Current weight 84.4 kg; weight range since admission 75.5-90.3 kg  Labs: reviewed Meds: MVI with Minerals, magox, solumedrol, thiamine  Diet Order:   Diet Order            Diet NPO time specified  Diet effective now              EDUCATION NEEDS:   Not appropriate for education at this time  Skin:  Skin Assessment: Skin Integrity Issues: Skin Integrity Issues:: DTI DTI: lip  Last BM:  4/13 rectal tube  Height:   Ht Readings from Last 1 Encounters:  11/11/19 5\' 8"  (1.727 m)    Weight:   Wt Readings from Last 1 Encounters:  11/19/19 84.4 kg    BMI:  Body mass index is 28.29 kg/m.  Estimated Nutritional Needs:   Kcal:  1950-2270 kcals  Protein:  120-145 g  Fluid:  >/= 2 L   11/21/19 MS, RDN, LDN, CNSC RD Pager Number and Weekend/On-Call After Hours Pager Located in [place]

## 2019-11-19 NOTE — Progress Notes (Signed)
Pt back on full vent support due to increased WOB. 

## 2019-11-19 NOTE — Progress Notes (Signed)
NAME:  [name], MRN:  [id number], DOB:  1955-10-13, LOS: 11 ADMISSION DATE:  11/23/2019, CONSULTATION DATE:  11/11/19 REFERRING MD:  Jonetta Osgood, MD, CHIEF COMPLAINT:  SHOB   Brief History    Admitted with Covid pneumonia 4/2 and intubated for hypoxic delirium 4/5 History of feeling unwell for about a week prior to presentation Decompensated about 24 hours following his positive test  Past Medical History   CAD s/p PCI/CABG, bronchial asthma, DM-2, LD, HTN  Significant Hospital Events   4/2>> admit to [facility] for worsening hypoxemia 4/3>> worsening hypoxemia-on 15 L HFNC and NRB 4/3>>Lower ext Doppler neg for DVT 4/4>>worsening confusion-pulling NRB-transfer to ICU 4/5 confused and delirious, intermittent desaturation when he takes nonrebreather off intubated, paralyzed & prone 4/9 has not needed proning off over 48 hours, oxygen requirement improving 4/12 Sedated fentanyl and propofol, did have significant vent dyssynchrony, breath stacking requiring paralytics, had to be suctioned multiple times for secretions-possible plugs,  Oxygen requirements marginally better  Consults:  PCCM  Procedures:  ETT 4/5 >> RIJ 4/5 >>  Significant Diagnostic Tests:  11/10/19 CXR >> bilateral infiltrates R > L 4/7 chest x-ray >>worsening aeration at the bases-reviewed by myself Micro Data:  4/2: Blood cultures>> neg 4/5 resp >>  Antimicrobials:  Rocephin: 4/2-4/6 Zithromax: 4/3-4/5   remdesivir 4/2 - 4/6  Interim history/subjective:  No issues overnight  Afebrile FiO2 down from 60-> 50% Remains on propofol 40 mcg/kg/min and fentanyl 200 mcg/hr with intermittent vent dyssynchrony  I/Os inaccurate  Objective   Blood pressure (!) 150/82, pulse 72, temperature 97.9 F (36.6 C), temperature source Oral, resp. rate 17, height 5\' 8"  (1.727 m), weight 84.4 kg, SpO2 92 %.    Vent Mode: PRVC FiO2 (%):  [50 %-60 %] 50 % Set Rate:  [20 bmp] 20 bmp Vt Set:  [480 mL] 480 mL PEEP:  [12  cmH20] 12 cmH20   Intake/Output Summary (Last 24 hours) at 11/19/2019 1043 Last data filed at 11/19/2019 1022 Gross per 24 hour  Intake 1212.39 ml  Output 3615 ml  Net -2402.61 ml   Filed Weights   11/17/19 0500 11/18/19 0500 11/19/19 0340  Weight: 83 kg 81.5 kg 84.4 kg   Examination: General:  Elderly male sedated on MV in NAD HEENT: MM pink/moist, ETT 24 at lip, right nare cortrak, pupils 5/reactive anicteric  Neuro: sedated, does not f/c CV: rr, no murmur PULM:  Intermittently dyssynchronous on PRVC, right exp wheeze, minimal secretions- flipped to PSV and doing well on minimal settings GI: soft, +bs, flexiseal in place, poorly fitting/ leaking condom cath, firm distention over bladder Extremities: warm/dry, dependent edema in arm, no LE edema  Skin: no rashes   Chest x-ray 4/13 personally reviewed, stable bilateral infiltrates/ atelectasis,  ET tube 6.2 cm above carina, stable OGT/ right IJ CVL  Resolved Hospital Problem list   N/A  Assessment & Plan:   ARDS 2/2 COVID-19 PNA  Asthma  Advance ETT by 1 cm  Better O2 requirements, intermittent dyssynchrony at times  Flipped to PSV this morning with good TVs and looks comfortable, however remains too sedate for extubation trial.  Will start weaning propofol/ fentany (day 8) for RASS goal 0/-1, w/ bowel regimen and prn versed if needed in addition to his oral sedation  Continue mechanical ventilation per ARDS protocol, PRVC 20/ 480/12/ 50 Target TVol 6-8cc/kgIBW, currently around 7 cc/kg IBW Target Plateau Pressure < 30cm H20 Target driving pressure less than 15 cm of water Target PaO2  55-65: titrate PEEP/ FiO2 per protocol Check CVP daily Target CVP less than 4, diurese as necessary Ventilator associated pneumonia prevention protocol Prn duonebs    COVID pneumonia  S/p Actemra and Remdesivir  Continue solumedrol taper, currently on 30mg  q 12 for 10 days then wean  Acute Encephalopathy  Heavy alcohol user Continue with  oral sedation of klonopin 1mg  BID, gabapentin, and qhs trazodone Add thiamine   AKI - resolved  Suspected urinary retention Bladder scan now and q shift  Trend BMP/ I/Os  DM Still > 180, exacerbated by steroids  Continue resistant SSI  Increase TF coverage Increase lantus to BID 30 units  Continue linagliptin  HTN, HLD Continue lopressor, simvastatin, ezetimibe, and ASA   Best practice:  Diet: tube feeds Pain/Anxiety/Delirium protocol (if indicated): Propofol/fent, prn versed if needed VAP protocol (if indicated): yes DVT prophylaxis: Lovenox GI prophylaxis: Protonix Glucose control: as above Mobility: Bed rest Code Status: Full code Family Communication: wife, Sybil, called, no answer Disposition: ICU  CRITICAL CARE Performed by:   Total critical care time: 40 minutes   Critical care time was exclusive of separately billable procedures and treating other patients.   Critical care was necessary to treat or prevent imminent or life-threatening deterioration.   Critical care was time spent personally by me on the following activities: development of treatment plan with patient and/or surrogate as well as nursing, discussions with consultants, evaluation of patient's response to treatment, examination of patient, obtaining history from patient or surrogate, ordering and performing treatments and interventions, ordering and review of laboratory studies, ordering and review of radiographic studies, pulse oximetry and re-evaluation of patient's condition.  , MSN, AGACNP-BC [place] [facility] 11/19/2019, 10:44 AM

## 2019-11-19 NOTE — Progress Notes (Signed)
ETT advanced 1 cm to 25 at the lip

## 2019-11-20 LAB — RENAL FUNCTION PANEL
Albumin: 2.3 g/dL — ABNORMAL LOW (ref 3.5–5.0)
Anion gap: 7 (ref 5–15)
BUN: 44 mg/dL — ABNORMAL HIGH (ref 8–23)
CO2: 30 mmol/L (ref 22–32)
Calcium: 8.8 mg/dL — ABNORMAL LOW (ref 8.9–10.3)
Chloride: 103 mmol/L (ref 98–111)
Creatinine, Ser: 0.7 mg/dL (ref 0.61–1.24)
GFR calc Af Amer: >60 mL/min (ref >60)
GFR calc non Af Amer: >60 mL/min (ref >60)
Glucose, Bld: 148 mg/dL — ABNORMAL HIGH (ref 70–99)
Phosphorus: 4.3 mg/dL (ref 2.5–4.6)
Potassium: 4.2 mmol/L (ref 3.5–5.1)
Sodium: 140 mmol/L (ref 135–145)

## 2019-11-20 LAB — CULTURE, RESPIRATORY W GRAM STAIN: Culture: NORMAL

## 2019-11-20 LAB — GLUCOSE, CAPILLARY
Glucose-Capillary: 139 mg/dL — ABNORMAL HIGH (ref 70–99)
Glucose-Capillary: 109 mg/dL — ABNORMAL HIGH (ref 70–99)
Glucose-Capillary: 192 mg/dL — ABNORMAL HIGH (ref 70–99)
Glucose-Capillary: 221 mg/dL — ABNORMAL HIGH (ref 70–99)
Glucose-Capillary: 167 mg/dL — ABNORMAL HIGH (ref 70–99)
Glucose-Capillary: 119 mg/dL — ABNORMAL HIGH (ref 70–99)

## 2019-11-20 LAB — MAGNESIUM: Magnesium: 2.3 mg/dL (ref 1.7–2.4)

## 2019-11-20 LAB — TRIGLYCERIDES: Triglycerides: 175 mg/dL — ABNORMAL HIGH (ref <150)

## 2019-11-20 MED ORDER — INSULIN GLARGINE 100 UNIT/ML ~~LOC~~ SOLN
25.0000 [IU] | Freq: Every day | SUBCUTANEOUS | Status: DC
  Administered 2019-11-20 – 2019-11-24 (×5): 25 [IU] via SUBCUTANEOUS
  Filled 2019-11-20 (×6): qty 0.25

## 2019-11-20 MED ORDER — INSULIN ASPART 100 UNIT/ML ~~LOC~~ SOLN: 0.0000 [IU] | SUBCUTANEOUS | Status: DC

## 2019-11-20 MED ORDER — INSULIN ASPART 100 UNIT/ML ~~LOC~~ SOLN
5.0000 [IU] | SUBCUTANEOUS | Status: DC
  Administered 2019-11-20 – 2019-11-24 (×24): 5 [IU] via SUBCUTANEOUS

## 2019-11-20 MED ORDER — DEXMEDETOMIDINE HCL IN NACL 400 MCG/100ML IV SOLN
0.4000 ug/kg/h | INTRAVENOUS | Status: DC
  Administered 2019-11-20: 0.4 ug/kg/h via INTRAVENOUS
  Administered 2019-11-20: 0.7 ug/kg/h via INTRAVENOUS
  Administered 2019-11-20: 1 ug/kg/h via INTRAVENOUS
  Administered 2019-11-21: 1.2 ug/kg/h via INTRAVENOUS
  Administered 2019-11-21 (×3): 1 ug/kg/h via INTRAVENOUS
  Administered 2019-11-21: 1.1 ug/kg/h via INTRAVENOUS
  Administered 2019-11-22 – 2019-11-23 (×6): 1.2 ug/kg/h via INTRAVENOUS
  Administered 2019-11-23: 0.9 ug/kg/h via INTRAVENOUS
  Administered 2019-11-23 (×3): 1.2 ug/kg/h via INTRAVENOUS
  Administered 2019-11-24: 0.8 ug/kg/h via INTRAVENOUS
  Administered 2019-11-24: 0.5 ug/kg/h via INTRAVENOUS
  Administered 2019-11-25: 0.8 ug/kg/h via INTRAVENOUS
  Administered 2019-11-25: 0.6 ug/kg/h via INTRAVENOUS
  Administered 2019-11-25: 0.7 ug/kg/h via INTRAVENOUS
  Administered 2019-11-26: 0.8 ug/kg/h via INTRAVENOUS
  Administered 2019-11-26: 1.6 ug/kg/h via INTRAVENOUS
  Administered 2019-11-26 – 2019-11-28 (×10): 1.4 ug/kg/h via INTRAVENOUS
  Administered 2019-11-28: 1.2 ug/kg/h via INTRAVENOUS
  Administered 2019-11-28: 0.9 ug/kg/h via INTRAVENOUS
  Administered 2019-11-28: 1 ug/kg/h via INTRAVENOUS
  Administered 2019-11-29: 1.2 ug/kg/h via INTRAVENOUS
  Administered 2019-11-29: 1.1 ug/kg/h via INTRAVENOUS
  Administered 2019-11-29: 0.959 ug/kg/h via INTRAVENOUS
  Administered 2019-11-29 (×3): 1.2 ug/kg/h via INTRAVENOUS
  Administered 2019-11-30 (×2): 1 ug/kg/h via INTRAVENOUS
  Administered 2019-11-30: 0.8 ug/kg/h via INTRAVENOUS
  Administered 2019-11-30: 1 ug/kg/h via INTRAVENOUS
  Administered 2019-11-30: 0.9 ug/kg/h via INTRAVENOUS
  Administered 2019-12-01: 1.2 ug/kg/h via INTRAVENOUS
  Administered 2019-12-01: 1 ug/kg/h via INTRAVENOUS
  Administered 2019-12-01: 1.2 ug/kg/h via INTRAVENOUS
  Administered 2019-12-01: 1 ug/kg/h via INTRAVENOUS
  Administered 2019-12-01: 0.6 ug/kg/h via INTRAVENOUS
  Administered 2019-12-02: 1.2 ug/kg/h via INTRAVENOUS
  Administered 2019-12-02 (×2): 1 ug/kg/h via INTRAVENOUS
  Administered 2019-12-02: 1.2 ug/kg/h via INTRAVENOUS
  Administered 2019-12-03: 1 ug/kg/h via INTRAVENOUS
  Administered 2019-12-03 (×2): 0.9 ug/kg/h via INTRAVENOUS
  Administered 2019-12-04: 1.002 ug/kg/h via INTRAVENOUS
  Administered 2019-12-04: 0.4 ug/kg/h via INTRAVENOUS
  Administered 2019-12-04 – 2019-12-05 (×2): 0.6 ug/kg/h via INTRAVENOUS
  Administered 2019-12-05: 0.8 ug/kg/h via INTRAVENOUS
  Administered 2019-12-06 – 2019-12-07 (×3): 0.4 ug/kg/h via INTRAVENOUS
  Administered 2019-12-07 – 2019-12-09 (×6): 0.6 ug/kg/h via INTRAVENOUS
  Administered 2019-12-10: 1 ug/kg/h via INTRAVENOUS
  Administered 2019-12-10 (×2): 0.6 ug/kg/h via INTRAVENOUS
  Administered 2019-12-10: 0.8 ug/kg/h via INTRAVENOUS
  Filled 2019-11-20 (×5): qty 100
  Filled 2019-11-20: qty 200
  Filled 2019-11-20 (×36): qty 100
  Filled 2019-11-20 (×2): qty 200
  Filled 2019-11-20 (×2): qty 100
  Filled 2019-11-20: qty 200
  Filled 2019-11-20 (×16): qty 100
  Filled 2019-11-20: qty 200
  Filled 2019-11-20 (×16): qty 100

## 2019-11-20 MED ORDER — FENTANYL CITRATE (PF) 100 MCG/2ML IJ SOLN
50.0000 ug | INTRAMUSCULAR | Status: DC | PRN
  Administered 2019-11-20 (×3): 200 ug via INTRAVENOUS
  Administered 2019-11-20: 100 ug via INTRAVENOUS
  Administered 2019-11-20: 200 ug via INTRAVENOUS
  Administered 2019-11-21: 100 ug via INTRAVENOUS
  Administered 2019-11-21: 200 ug via INTRAVENOUS
  Administered 2019-11-21: 100 ug via INTRAVENOUS
  Filled 2019-11-20 (×5): qty 4
  Filled 2019-11-20 (×3): qty 2

## 2019-11-20 MED ORDER — ALBUTEROL SULFATE (2.5 MG/3ML) 0.083% IN NEBU
2.5000 mg | INHALATION_SOLUTION | RESPIRATORY_TRACT | Status: DC | PRN
  Administered 2019-11-21 – 2019-11-23 (×4): 2.5 mg via RESPIRATORY_TRACT
  Filled 2019-11-20 (×9): qty 3

## 2019-11-20 MED ORDER — FENTANYL CITRATE (PF) 100 MCG/2ML IJ SOLN
50.0000 ug | INTRAMUSCULAR | Status: AC | PRN
  Administered 2019-11-21 – 2019-11-22 (×3): 50 ug via INTRAVENOUS
  Filled 2019-11-20 (×3): qty 2

## 2019-11-20 MED ORDER — FENTANYL CITRATE (PF) 100 MCG/2ML IJ SOLN
INTRAMUSCULAR | Status: AC
  Administered 2019-11-20: 200 ug via INTRAVENOUS
  Filled 2019-11-20: qty 4

## 2019-11-20 MED ORDER — INSULIN GLARGINE 100 UNIT/ML ~~LOC~~ SOLN
25.0000 [IU] | Freq: Every day | SUBCUTANEOUS | Status: DC
  Administered 2019-11-20 – 2019-11-25 (×6): 25 [IU] via SUBCUTANEOUS
  Filled 2019-11-20 (×6): qty 0.25

## 2019-11-20 MED ORDER — INSULIN ASPART 100 UNIT/ML ~~LOC~~ SOLN
0.0000 [IU] | SUBCUTANEOUS | Status: DC
  Administered 2019-11-20: 5 [IU] via SUBCUTANEOUS
  Administered 2019-11-20 – 2019-11-21 (×3): 3 [IU] via SUBCUTANEOUS
  Administered 2019-11-21 (×4): 2 [IU] via SUBCUTANEOUS
  Administered 2019-11-22 (×3): 3 [IU] via SUBCUTANEOUS
  Administered 2019-11-22: 2 [IU] via SUBCUTANEOUS
  Administered 2019-11-23: 3 [IU] via SUBCUTANEOUS
  Administered 2019-11-23: 2 [IU] via SUBCUTANEOUS
  Administered 2019-11-23 (×2): 3 [IU] via SUBCUTANEOUS
  Administered 2019-11-24 (×3): 2 [IU] via SUBCUTANEOUS
  Administered 2019-11-24 (×2): 3 [IU] via SUBCUTANEOUS
  Administered 2019-11-25: 5 [IU] via SUBCUTANEOUS
  Administered 2019-11-25: 8 [IU] via SUBCUTANEOUS
  Administered 2019-11-26: 3 [IU] via SUBCUTANEOUS
  Administered 2019-11-26: 2 [IU] via SUBCUTANEOUS
  Administered 2019-11-26: 3 [IU] via SUBCUTANEOUS
  Administered 2019-11-26: 11 [IU] via SUBCUTANEOUS
  Administered 2019-11-26: 8 [IU] via SUBCUTANEOUS
  Administered 2019-11-26: 2 [IU] via SUBCUTANEOUS
  Administered 2019-11-27: 8 [IU] via SUBCUTANEOUS
  Administered 2019-11-27: 5 [IU] via SUBCUTANEOUS
  Administered 2019-11-27 – 2019-11-28 (×5): 3 [IU] via SUBCUTANEOUS
  Administered 2019-11-28: 5 [IU] via SUBCUTANEOUS
  Administered 2019-11-28: 15 [IU] via SUBCUTANEOUS
  Administered 2019-11-28 (×2): 5 [IU] via SUBCUTANEOUS
  Administered 2019-11-28: 3 [IU] via SUBCUTANEOUS
  Administered 2019-11-29 (×3): 2 [IU] via SUBCUTANEOUS
  Administered 2019-11-29 – 2019-11-30 (×2): 3 [IU] via SUBCUTANEOUS
  Administered 2019-11-30 (×2): 2 [IU] via SUBCUTANEOUS
  Administered 2019-11-30 (×2): 3 [IU] via SUBCUTANEOUS
  Administered 2019-12-01 (×2): 2 [IU] via SUBCUTANEOUS
  Administered 2019-12-01 (×2): 3 [IU] via SUBCUTANEOUS
  Administered 2019-12-01: 2 [IU] via SUBCUTANEOUS
  Administered 2019-12-01 – 2019-12-02 (×4): 3 [IU] via SUBCUTANEOUS
  Administered 2019-12-02: 5 [IU] via SUBCUTANEOUS
  Administered 2019-12-02 (×2): 3 [IU] via SUBCUTANEOUS
  Administered 2019-12-03: 5 [IU] via SUBCUTANEOUS
  Administered 2019-12-03: 2 [IU] via SUBCUTANEOUS
  Administered 2019-12-03 (×2): 3 [IU] via SUBCUTANEOUS
  Administered 2019-12-04: 5 [IU] via SUBCUTANEOUS
  Administered 2019-12-04: 3 [IU] via SUBCUTANEOUS
  Administered 2019-12-04 (×2): 5 [IU] via SUBCUTANEOUS
  Administered 2019-12-04 – 2019-12-05 (×5): 3 [IU] via SUBCUTANEOUS
  Administered 2019-12-05 (×3): 5 [IU] via SUBCUTANEOUS
  Administered 2019-12-06 (×2): 3 [IU] via SUBCUTANEOUS
  Administered 2019-12-06: 5 [IU] via SUBCUTANEOUS
  Administered 2019-12-06: 3 [IU] via SUBCUTANEOUS
  Administered 2019-12-06: 5 [IU] via SUBCUTANEOUS
  Administered 2019-12-06 – 2019-12-07 (×3): 3 [IU] via SUBCUTANEOUS
  Administered 2019-12-07: 8 [IU] via SUBCUTANEOUS
  Administered 2019-12-07: 5 [IU] via SUBCUTANEOUS
  Administered 2019-12-07: 3 [IU] via SUBCUTANEOUS
  Administered 2019-12-07: 2 [IU] via SUBCUTANEOUS
  Administered 2019-12-08 (×5): 3 [IU] via SUBCUTANEOUS
  Administered 2019-12-09: 2 [IU] via SUBCUTANEOUS
  Administered 2019-12-09: 3 [IU] via SUBCUTANEOUS
  Administered 2019-12-09: 2 [IU] via SUBCUTANEOUS
  Administered 2019-12-09: 3 [IU] via SUBCUTANEOUS
  Administered 2019-12-09: 2 [IU] via SUBCUTANEOUS
  Administered 2019-12-10: 3 [IU] via SUBCUTANEOUS
  Administered 2019-12-10: 2 [IU] via SUBCUTANEOUS
  Administered 2019-12-10 (×2): 3 [IU] via SUBCUTANEOUS
  Administered 2019-12-10: 5 [IU] via SUBCUTANEOUS
  Administered 2019-12-10 – 2019-12-11 (×3): 3 [IU] via SUBCUTANEOUS
  Administered 2019-12-11: 2 [IU] via SUBCUTANEOUS
  Administered 2019-12-11 (×2): 3 [IU] via SUBCUTANEOUS
  Administered 2019-12-11 – 2019-12-12 (×6): 2 [IU] via SUBCUTANEOUS
  Administered 2019-12-13 (×3): 3 [IU] via SUBCUTANEOUS
  Administered 2019-12-13 (×2): 2 [IU] via SUBCUTANEOUS
  Administered 2019-12-13 – 2019-12-14 (×2): 3 [IU] via SUBCUTANEOUS
  Administered 2019-12-14: 5 [IU] via SUBCUTANEOUS
  Administered 2019-12-14 (×2): 3 [IU] via SUBCUTANEOUS
  Administered 2019-12-14: 5 [IU] via SUBCUTANEOUS
  Administered 2019-12-15: 3 [IU] via SUBCUTANEOUS
  Administered 2019-12-15: 5 [IU] via SUBCUTANEOUS
  Administered 2019-12-15 (×4): 3 [IU] via SUBCUTANEOUS
  Administered 2019-12-16: 2 [IU] via SUBCUTANEOUS
  Administered 2019-12-16 (×2): 3 [IU] via SUBCUTANEOUS
  Administered 2019-12-16 (×2): 2 [IU] via SUBCUTANEOUS
  Administered 2019-12-16 – 2019-12-17 (×2): 3 [IU] via SUBCUTANEOUS
  Administered 2019-12-17 (×3): 5 [IU] via SUBCUTANEOUS
  Administered 2019-12-17 – 2019-12-18 (×2): 3 [IU] via SUBCUTANEOUS
  Administered 2019-12-18: 2 [IU] via SUBCUTANEOUS
  Administered 2019-12-18: 5 [IU] via SUBCUTANEOUS
  Administered 2019-12-18: 2 [IU] via SUBCUTANEOUS
  Administered 2019-12-18 – 2019-12-19 (×3): 3 [IU] via SUBCUTANEOUS
  Administered 2019-12-19: 2 [IU] via SUBCUTANEOUS
  Administered 2019-12-19: 10 [IU] via SUBCUTANEOUS
  Administered 2019-12-20: 3 [IU] via SUBCUTANEOUS
  Administered 2019-12-20: 2 [IU] via SUBCUTANEOUS
  Administered 2019-12-20: 3 [IU] via SUBCUTANEOUS
  Administered 2019-12-20 – 2019-12-21 (×2): 2 [IU] via SUBCUTANEOUS
  Administered 2019-12-21 (×2): 3 [IU] via SUBCUTANEOUS
  Administered 2019-12-21 (×2): 2 [IU] via SUBCUTANEOUS
  Administered 2019-12-22: 3 [IU] via SUBCUTANEOUS
  Administered 2019-12-22: 2 [IU] via SUBCUTANEOUS
  Administered 2019-12-22 (×3): 3 [IU] via SUBCUTANEOUS
  Administered 2019-12-22: 5 [IU] via SUBCUTANEOUS
  Administered 2019-12-22 – 2019-12-24 (×5): 2 [IU] via SUBCUTANEOUS
  Administered 2019-12-24 (×2): 3 [IU] via SUBCUTANEOUS
  Administered 2019-12-25 – 2019-12-26 (×9): 2 [IU] via SUBCUTANEOUS
  Administered 2019-12-26: 3 [IU] via SUBCUTANEOUS
  Administered 2019-12-27: 2 [IU] via SUBCUTANEOUS
  Administered 2019-12-27: 3 [IU] via SUBCUTANEOUS
  Administered 2019-12-27 – 2019-12-28 (×3): 2 [IU] via SUBCUTANEOUS
  Administered 2019-12-28: 3 [IU] via SUBCUTANEOUS
  Administered 2019-12-28: 2 [IU] via SUBCUTANEOUS
  Administered 2019-12-28 (×2): 3 [IU] via SUBCUTANEOUS
  Administered 2019-12-29: 2 [IU] via SUBCUTANEOUS
  Administered 2019-12-29: 3 [IU] via SUBCUTANEOUS
  Administered 2019-12-29: 2 [IU] via SUBCUTANEOUS
  Administered 2019-12-29 – 2019-12-30 (×5): 3 [IU] via SUBCUTANEOUS
  Administered 2019-12-30: 2 [IU] via SUBCUTANEOUS
  Administered 2019-12-30: 3 [IU] via SUBCUTANEOUS
  Administered 2019-12-30: 2 [IU] via SUBCUTANEOUS
  Administered 2019-12-30 – 2019-12-31 (×3): 3 [IU] via SUBCUTANEOUS
  Administered 2019-12-31: 4 [IU] via SUBCUTANEOUS
  Administered 2019-12-31 – 2020-01-01 (×6): 3 [IU] via SUBCUTANEOUS
  Administered 2020-01-01: 5 [IU] via SUBCUTANEOUS
  Administered 2020-01-01: 3 [IU] via SUBCUTANEOUS
  Administered 2020-01-02: 2 [IU] via SUBCUTANEOUS
  Administered 2020-01-02 (×2): 5 [IU] via SUBCUTANEOUS
  Administered 2020-01-02: 2 [IU] via SUBCUTANEOUS
  Administered 2020-01-02: 3 [IU] via SUBCUTANEOUS
  Administered 2020-01-03: 5 [IU] via SUBCUTANEOUS
  Administered 2020-01-03: 3 [IU] via SUBCUTANEOUS
  Administered 2020-01-03: 2 [IU] via SUBCUTANEOUS
  Administered 2020-01-03: 5 [IU] via SUBCUTANEOUS
  Administered 2020-01-03: 3 [IU] via SUBCUTANEOUS
  Administered 2020-01-03: 2 [IU] via SUBCUTANEOUS
  Administered 2020-01-04 (×2): 3 [IU] via SUBCUTANEOUS
  Administered 2020-01-04: 5 [IU] via SUBCUTANEOUS
  Administered 2020-01-04 (×2): 3 [IU] via SUBCUTANEOUS
  Administered 2020-01-04: 2 [IU] via SUBCUTANEOUS
  Administered 2020-01-04: 5 [IU] via SUBCUTANEOUS
  Administered 2020-01-05: 2 [IU] via SUBCUTANEOUS
  Administered 2020-01-05: 5 [IU] via SUBCUTANEOUS
  Administered 2020-01-05 – 2020-01-06 (×3): 3 [IU] via SUBCUTANEOUS
  Administered 2020-01-06: 8 [IU] via SUBCUTANEOUS
  Administered 2020-01-06 (×2): 3 [IU] via SUBCUTANEOUS
  Administered 2020-01-06: 5 [IU] via SUBCUTANEOUS
  Administered 2020-01-06: 3 [IU] via SUBCUTANEOUS
  Administered 2020-01-07: 5 [IU] via SUBCUTANEOUS
  Administered 2020-01-07: 3 [IU] via SUBCUTANEOUS
  Administered 2020-01-07: 2 [IU] via SUBCUTANEOUS

## 2019-11-20 NOTE — Progress Notes (Signed)
NAME:  [name], MRN:  [id number], DOB:  14-Feb-1956, LOS: 12 ADMISSION DATE:  11/22/2019, CONSULTATION DATE:  11/11/19 REFERRING MD:  Maretta Bees, MD, CHIEF COMPLAINT:  SHOB   Brief History    Admitted with Covid pneumonia 4/2 and intubated for hypoxic delirium 4/5 History of feeling unwell for about a week prior to presentation Decompensated about 24 hours following his positive test  Past Medical History   CAD s/p PCI/CABG, bronchial asthma, DM-2, LD, HTN  Significant Hospital Events   4/2>> admit to [facility] for worsening hypoxemia 4/3>> worsening hypoxemia-on 15 L HFNC and NRB 4/3>>Lower ext Doppler neg for DVT 4/4>>worsening confusion-pulling NRB-transfer to ICU 4/5 confused and delirious, intermittent desaturation when he takes nonrebreather off intubated, paralyzed & prone 4/9 has not needed proning off over 48 hours, oxygen requirement improving 4/12 Sedated fentanyl and propofol, did have significant vent dyssynchrony, breath stacking requiring paralytics, had to be suctioned multiple times for secretions-possible plugs,  Oxygen requirements marginally better  Consults:  PCCM  Procedures:  ETT 4/5 >> RIJ 4/5 >>  Significant Diagnostic Tests:  11/10/19 CXR >> bilateral infiltrates R > L 4/7 chest x-ray >>worsening aeration at the bases-reviewed by myself Micro Data:  4/2: Blood cultures>> neg 4/5 resp >>  Antimicrobials:  Rocephin: 4/2-4/6 Zithromax: 4/3-4/5   remdesivir 4/2 - 4/6  Interim history/subjective:  Better ventilator synchrony on PSV. Patient was able to tolerate PSV 8/8 yesterday. Set to wean this morning - not waking up on sedation.   Objective   Blood pressure 133/77, pulse 90, temperature 98.4 F (36.9 C), temperature source Oral, resp. rate 13, height 5\' 8"  (1.727 m), weight 83.4 kg, SpO2 92 %.    Vent Mode: PRVC FiO2 (%):  [50 %] 50 % Set Rate:  [20 bmp] 20 bmp Vt Set:  [480 mL] 480 mL PEEP:  [8 cmH20-18 cmH20] 12 cmH20 Pressure  Support:  [8 cmH20] 8 cmH20   Intake/Output Summary (Last 24 hours) at 11/20/2019 0906 Last data filed at 11/20/2019 0551 Gross per 24 hour  Intake 1009 ml  Output 5500 ml  Net -4491 ml   Filed Weights   11/18/19 0500 11/19/19 0340 11/20/19 0342  Weight: 81.5 kg 84.4 kg 83.4 kg   Examination: General:  Elderly male sedated on MV in NAD HEENT: MM pink/moist, ETT 24 at lip, right nare cortrak, pupils 5/reactive anicteric  Neuro: sedated, does not f/c CV: rr, no murmur PULM: chest clear with increased WOB on sedation vacation but with excellent tidal volumes. No wheezing. GI: soft, +bs, flexiseal in place, poorly fitting/ leaking condom cath, firm distention over bladder Extremities: warm/dry, dependent edema in arm, no LE edema  Skin: no rashes    Resolved Hospital Problem list   N/A  Assessment & Plan:   Critically ill due to ARDS requiring mechanical ventilation due to  COVID-19 pneumonia.  Asthma by history. Improved lung compliance and able to take adequate tidal volumes with less dyssynchrony on PSV.  - Sedation vacation as patient needs to wake up.  Switch to intermittent dosing.  COVID pneumonia  -S/p Actemra and Remdesivir  -Stop steroids  Acute Encephalopathy  Heavy alcohol user -Continue with oral sedation of klonopin 1mg  BID, gabapentin, and qhs trazodone -Sedation vacation  HTN, HLD -Continue lopressor, simvastatin, ezetimibe, and ASA   Best practice:  Diet: tube feeds Pain/Anxiety/Delirium protocol (if indicated): intermittent sedation only.  VAP protocol (if indicated): yes DVT prophylaxis: Lovenox GI prophylaxis: Protonix Glucose control: type 2 diabetes with  acceptable control. Mobility: Bed rest Code Status: Full code Family Communication: wife, Golda Acre, called, no answer Disposition: ICU  CRITICAL CARE Performed by: Kipp Brood   Total critical care time: 40 minutes  Critical care time was exclusive of separately billable procedures and  treating other patients.  Critical care was necessary to treat or prevent imminent or life-threatening deterioration.  Critical care was time spent personally by me on the following activities: development of treatment plan with patient and/or surrogate as well as nursing, discussions with consultants, evaluation of patient's response to treatment, examination of patient, obtaining history from patient or surrogate, ordering and performing treatments and interventions, ordering and review of laboratory studies, ordering and review of radiographic studies, pulse oximetry, re-evaluation of patient's condition and participation in multidisciplinary rounds.  Kipp Brood, MD [facility] ICU Physician [place]  Pager: [phone] Mobile: [phone] After hours: [phone].   11/20/2019, 9:06 AM

## 2019-11-21 ENCOUNTER — Inpatient Hospital Stay (HOSPITAL_COMMUNITY): Payer: Medicare Other

## 2019-11-21 LAB — GLUCOSE, CAPILLARY
Glucose-Capillary: 84 mg/dL (ref 70–99)
Glucose-Capillary: 131 mg/dL — ABNORMAL HIGH (ref 70–99)
Glucose-Capillary: 143 mg/dL — ABNORMAL HIGH (ref 70–99)
Glucose-Capillary: 124 mg/dL — ABNORMAL HIGH (ref 70–99)
Glucose-Capillary: 117 mg/dL — ABNORMAL HIGH (ref 70–99)
Glucose-Capillary: 177 mg/dL — ABNORMAL HIGH (ref 70–99)

## 2019-11-21 MED ORDER — MONTELUKAST SODIUM 10 MG PO TABS
10.0000 mg | ORAL_TABLET | Freq: Every day | ORAL | Status: DC
  Administered 2019-11-21 – 2019-12-01 (×11): 10 mg via ORAL
  Filled 2019-11-21 (×11): qty 1

## 2019-11-21 MED ORDER — LACTATED RINGERS IV BOLUS
500.0000 mL | Freq: Once | INTRAVENOUS | Status: AC
  Administered 2019-11-21: 13:00:00 500 mL via INTRAVENOUS

## 2019-11-21 MED ORDER — MAGNESIUM SULFATE 2 GM/50ML IV SOLN
2.0000 g | Freq: Once | INTRAVENOUS | Status: AC
  Administered 2019-11-21: 2 g via INTRAVENOUS
  Filled 2019-11-21: qty 50

## 2019-11-21 MED ORDER — ALBUTEROL SULFATE (2.5 MG/3ML) 0.083% IN NEBU
2.5000 mg | INHALATION_SOLUTION | Freq: Once | RESPIRATORY_TRACT | Status: AC
  Administered 2019-11-21: 2.5 mg via RESPIRATORY_TRACT

## 2019-11-21 MED ORDER — ALBUTEROL (5 MG/ML) CONTINUOUS INHALATION SOLN
2.5000 mg/h | INHALATION_SOLUTION | RESPIRATORY_TRACT | Status: DC
  Filled 2019-11-21: qty 20

## 2019-11-21 MED ORDER — METHYLPREDNISOLONE SODIUM SUCC 125 MG IJ SOLR
80.0000 mg | Freq: Three times a day (TID) | INTRAMUSCULAR | Status: AC
  Administered 2019-11-21 – 2019-11-23 (×6): 80 mg via INTRAVENOUS
  Filled 2019-11-21 (×6): qty 2

## 2019-11-21 MED ORDER — NOREPINEPHRINE 4 MG/250ML-% IV SOLN
0.0000 ug/min | INTRAVENOUS | Status: DC
  Administered 2019-11-21: 3 ug/min via INTRAVENOUS
  Administered 2019-11-22: 13:00:00 5 ug/min via INTRAVENOUS
  Filled 2019-11-21: qty 250

## 2019-11-21 MED ORDER — METHYLPREDNISOLONE SODIUM SUCC 40 MG IJ SOLR
40.0000 mg | INTRAMUSCULAR | Status: AC
  Administered 2019-11-23 – 2019-11-27 (×5): 40 mg via INTRAVENOUS
  Filled 2019-11-21 (×5): qty 1

## 2019-11-21 MED ORDER — NOREPINEPHRINE 4 MG/250ML-% IV SOLN
INTRAVENOUS | Status: AC
  Administered 2019-11-21: 4 mg
  Filled 2019-11-21: qty 250

## 2019-11-21 MED ORDER — ALBUTEROL SULFATE (2.5 MG/3ML) 0.083% IN NEBU
20.0000 mg | INHALATION_SOLUTION | RESPIRATORY_TRACT | Status: DC
  Filled 2019-11-21: qty 24

## 2019-11-21 NOTE — Progress Notes (Addendum)
eLink Physician-Brief Progress Note Patient Name: [name] DOB: 11/03/1955 MRN: [id number]   Date of Service  11/21/2019  HPI/Events of Note  Hypotension. Ventilated COVID patient with e/o barotrauma on today's CXR but without frank pneumothorax.   eICU Interventions  Start levophed drip. STAT repeat CXR.   ADDENDUM: - I have reviewed the CXR. There is no evidence of pneumothorax. There is relatively stable pneumomediastinum and pneumopericardium. - Continue to monitor hemodynamics closely. Currently on only 2 mcg/min (~ 0.02-0.03 mcg/kg/min) levophed with a robust BP.  Intervention Category Major Interventions: Hypotension - evaluation and management  Janae Bridgeman 11/21/2019, 10:35 PM

## 2019-11-21 NOTE — Progress Notes (Signed)
Critical care attending  Called back to the bedside for sudden onset of bradycardia and hypotension.   On examination: in less distress. Peak pressure has decreased. Air entry had improved. Trachea is midline.   CXR showed now significant change in pneumopericardium, no pneumothoraces. Lung field are clear.   No sinister cause found for hypotension. Suspect improvement in airway obstruction has caused decrease in sympathetic tone following bronchodilators, magnesium and change in blocked HME.  Continue to treat asthma exacerbation.  Fluid challenge. Continue to titrate NE to keep MAP>65.   CRITICAL CARE Performed by: Lynnell Catalan   Additional critical care time: 50 minutes  Critical care time was exclusive of separately billable procedures and treating other patients.  Critical care was necessary to treat or prevent imminent or life-threatening deterioration.  Critical care was time spent personally by me on the following activities: development of treatment plan with patient and/or surrogate as well as nursing, discussions with consultants, evaluation of patient's response to treatment, examination of patient, obtaining history from patient or surrogate, ordering and performing treatments and interventions, ordering and review of laboratory studies, ordering and review of radiographic studies, pulse oximetry, re-evaluation of patient's condition and participation in multidisciplinary rounds.  Lynnell Catalan, MD [facility] ICU Physician [facility] Critical Care  Pager: [phone] Mobile: [phone] After hours: [phone].

## 2019-11-21 NOTE — Progress Notes (Signed)
NAME:  [name], MRN:  [id number], DOB:  1956/04/13, LOS: 69 ADMISSION DATE:  11/11/2019, CONSULTATION DATE:  11/11/19 REFERRING MD:  Jonetta Osgood, MD, CHIEF COMPLAINT:  SHOB   Brief History    Admitted with Covid pneumonia 4/2 and intubated for hypoxic delirium 4/5 History of feeling unwell for about a week prior to presentation Decompensated about 24 hours following his positive test  Past Medical History   CAD s/p PCI/CABG, bronchial asthma, DM-2, LD, HTN  Significant Hospital Events   4/2>> admit to [facility] for worsening hypoxemia 4/3>> worsening hypoxemia-on 15 L HFNC and NRB 4/3>>Lower ext Doppler neg for DVT 4/4>>worsening confusion-pulling NRB-transfer to ICU 4/5 confused and delirious, intermittent desaturation when he takes nonrebreather off intubated, paralyzed & prone 4/9 has not needed proning off over 48 hours, oxygen requirement improving 4/12 Sedated fentanyl and propofol, did have significant vent dyssynchrony, breath stacking requiring paralytics, had to be suctioned multiple times for secretions-possible plugs,  Oxygen requirements marginally better 4/14 increased work of breathing off sedation.  No purposeful movement.  Consults:  PCCM  Procedures:  ETT 4/5 >> RIJ 4/5 >>  Significant Diagnostic Tests:  11/10/19 CXR >> bilateral infiltrates R > L 4/7 chest x-ray >>worsening aeration at the bases-reviewed by myself 4/14 chest x-ray-near complete resolution of air space disease. Micro Data:  4/2: Blood cultures>> neg 4/5 resp >>  Antimicrobials:  Rocephin: 4/2-4/6 Zithromax: 4/3-4/5  remdesivir 4/2 - 4/6  Interim history/subjective:  Still not waking up.  Requiring low-dose dexmedetomidine to improve visible work of breathing.  Significant high airway pressures.  Objective   Blood pressure 105/74, pulse (!) 108, temperature 99.4 F (37.4 C), temperature source Oral, resp. rate (!) 21, height 5\' 8"  (1.727 m), weight 81.4 kg, SpO2 93 %.    Vent  Mode: PRVC FiO2 (%):  [50 %-60 %] 50 % Set Rate:  [20 bmp] 20 bmp Vt Set:  [480 mL] 480 mL PEEP:  [8 cmH20] 8 cmH20 Pressure Support:  [8 cmH20] 8 cmH20 Plateau Pressure:  [23 cmH20-30 cmH20] 23 cmH20   Intake/Output Summary (Last 24 hours) at 11/21/2019 0843 Last data filed at 11/21/2019 0600 Gross per 24 hour  Intake 2293.59 ml  Output 2005 ml  Net 288.59 ml   Filed Weights   11/19/19 0340 11/20/19 0342 11/21/19 0431  Weight: 84.4 kg 83.4 kg 81.4 kg   Examination: General: Thin male male sedated on MV in NAD HEENT: ET tube OG tube in place neuro: sedated, does not f/c CV: Heart sounds unremarkable. PULM: Distant breath sounds.  No wheezing.  Marked peaked plateau difference.  Obstructive flow pattern on flow volume curve. GI: soft, +bs, Extremities: warm/dry, dependent edema in arm, no LE edema  Skin: no rashes    Resolved Hospital Problem list   N/A  Assessment & Plan:   Critically ill due to ARDS requiring mechanical ventilation due to  COVID-19 pneumonia.  Asthma by history. Improved lung compliance and able to take adequate tidal volumes with less dyssynchrony on PSV.  Increased work of breathing appears to be largely driven by obstructive lung disease. -Continue to maximize bronchodilators -Add inhaled corticosteroid -Chest physiotherapy. - Sedation vacation as patient needs to wake up.  Switch to intermittent dosing.  COVID pneumonia  -S/p Actemra and Remdesivir  -Stop steroids  Acute Encephalopathy  Heavy alcohol user -Continue with oral sedation of klonopin 1mg  BID, gabapentin, and qhs trazodone -Sedation vacation  HTN, HLD -Continue lopressor, simvastatin, ezetimibe, and ASA   Best  practice:  Diet: tube feeds Pain/Anxiety/Delirium protocol (if indicated): Dexmedetomidine and intermittent fentanyl VAP protocol (if indicated): yes DVT prophylaxis: Lovenox GI prophylaxis: Protonix Glucose control: type 2 diabetes with acceptable control. Mobility:  Bed rest Code Status: Full code Family Communication: wife, Gillis Ends, called, no answer Disposition: ICU  CRITICAL CARE Performed by: Lynnell Catalan   Total critical care time: 40 minutes  Critical care time was exclusive of separately billable procedures and treating other patients.  Critical care was necessary to treat or prevent imminent or life-threatening deterioration.  Critical care was time spent personally by me on the following activities: development of treatment plan with patient and/or surrogate as well as nursing, discussions with consultants, evaluation of patient's response to treatment, examination of patient, obtaining history from patient or surrogate, ordering and performing treatments and interventions, ordering and review of laboratory studies, ordering and review of radiographic studies, pulse oximetry, re-evaluation of patient's condition and participation in multidisciplinary rounds.  Lynnell Catalan, MD [facility] ICU Physician [facility] [place] Critical Care  Pager: [phone] Mobile: [phone] After hours: [phone].   11/21/2019, 8:43 AM

## 2019-11-21 NOTE — Progress Notes (Signed)
      [address]       [place] [address]             [phone]      Asked to evaluate by Dr. Denese Killings  [name] is a 64 yo man with a history of CAD, PCI, CABG, asthma, type II DM, hypertension and hyperlipidemia. Admitted 4/2 with worsening dyspnea. + COVID. Intubated on 4/5 with severe ARDS. This morning had a pneumomediastinum/ pneumopericardium on CXR.  This afternoon he had an abrupt drop in BP, requiring a norepinephrine drip. Interestingly, around the same time his HR dropped and his peak ventilator pressures decreased as well.  Currently intubated, unresponsive 116/90, 77 SR. RR 30  PORTABLE CHEST 1 VIEW  COMPARISON:  November 21, 2019 study obtained earlier in the day  FINDINGS: Endotracheal tube tip is 5.9 cm above the carina. Central catheter tip is in the superior vena cava near the cavoatrial junction. Feeding tube tip is below the diaphragm. There is extensive pneumomediastinum with apparent degree of pneumopericardium as well. No pneumothorax is appreciable.  There is bibasilar atelectasis. There is no frank consolidation. Heart size and pulmonary vascularity normal. Patient is status post coronary artery bypass grafting.  IMPRESSION: Tube and catheter positions as described. Extensive pneumomediastinum and suspected pneumopericardium persist. No pneumothorax is demonstrable. Bibasilar atelectasis. No new opacity. Stable cardiac silhouette.   Electronically Signed   By: Bretta Bang III M.D.   On: 11/21/2019 13:03   I reviewed the CXR and discussed with Dr. Denese Killings. The CXR is about the same, possibly slightly better. No tracheal deviation and has improved air movement bilaterally compared to earlier today.   I doubt the BP changes are related to pneumomediastinum/ pericardium. Would likely be associated with tachycardia rather than normalization of HR. Also would expect PIP to worsen rather than improve as it has.   No  intervention at this time. If hemodynamics worsen significantly could consider having Cardiology place a pericardial drain  Viviann Spare C. Dorris Fetch, MD [facility] [phone]

## 2019-11-22 ENCOUNTER — Inpatient Hospital Stay (HOSPITAL_COMMUNITY): Payer: Medicare Other

## 2019-11-22 LAB — GLUCOSE, CAPILLARY
Glucose-Capillary: 122 mg/dL — ABNORMAL HIGH (ref 70–99)
Glucose-Capillary: 67 mg/dL — ABNORMAL LOW (ref 70–99)
Glucose-Capillary: 121 mg/dL — ABNORMAL HIGH (ref 70–99)
Glucose-Capillary: 186 mg/dL — ABNORMAL HIGH (ref 70–99)
Glucose-Capillary: 184 mg/dL — ABNORMAL HIGH (ref 70–99)
Glucose-Capillary: 173 mg/dL — ABNORMAL HIGH (ref 70–99)

## 2019-11-22 LAB — COMPREHENSIVE METABOLIC PANEL
ALT: 35 U/L (ref 0–44)
AST: 34 U/L (ref 15–41)
Albumin: 2.4 g/dL — ABNORMAL LOW (ref 3.5–5.0)
Alkaline Phosphatase: 57 U/L (ref 38–126)
Anion gap: 6 (ref 5–15)
BUN: 53 mg/dL — ABNORMAL HIGH (ref 8–23)
CO2: 36 mmol/L — ABNORMAL HIGH (ref 22–32)
Calcium: 8.5 mg/dL — ABNORMAL LOW (ref 8.9–10.3)
Chloride: 104 mmol/L (ref 98–111)
Creatinine, Ser: 0.73 mg/dL (ref 0.61–1.24)
GFR calc Af Amer: >60 mL/min (ref >60)
GFR calc non Af Amer: >60 mL/min (ref >60)
Glucose, Bld: 115 mg/dL — ABNORMAL HIGH (ref 70–99)
Potassium: 4.7 mmol/L (ref 3.5–5.1)
Sodium: 146 mmol/L — ABNORMAL HIGH (ref 135–145)
Total Bilirubin: 0.9 mg/dL (ref 0.3–1.2)
Total Protein: 5.1 g/dL — ABNORMAL LOW (ref 6.5–8.1)

## 2019-11-22 MED ORDER — VECURONIUM BROMIDE 10 MG IV SOLR
INTRAVENOUS | Status: AC
  Administered 2019-11-22: 10 mg
  Filled 2019-11-22: qty 10

## 2019-11-22 MED ORDER — DEXTROSE 50 % IV SOLN: 12.5000 g | Freq: Once | INTRAVENOUS | Status: AC

## 2019-11-22 MED ORDER — MIDAZOLAM HCL 2 MG/2ML IJ SOLN
2.0000 mg | Freq: Once | INTRAMUSCULAR | Status: AC
  Administered 2019-11-25: 2 mg via INTRAVENOUS
  Filled 2019-11-22 (×2): qty 2

## 2019-11-22 MED ORDER — FENTANYL CITRATE (PF) 100 MCG/2ML IJ SOLN
50.0000 ug | Freq: Once | INTRAMUSCULAR | Status: AC
  Administered 2019-11-22: 50 ug via INTRAVENOUS

## 2019-11-22 MED ORDER — STERILE WATER FOR INJECTION IJ SOLN
INTRAMUSCULAR | Status: AC
  Administered 2019-11-22: 10 mL
  Filled 2019-11-22: qty 10

## 2019-11-22 MED ORDER — MIDAZOLAM HCL 2 MG/2ML IJ SOLN
INTRAMUSCULAR | Status: AC
  Administered 2019-11-22: 2 mg
  Filled 2019-11-22: qty 2

## 2019-11-22 MED ORDER — MIDAZOLAM HCL 2 MG/2ML IJ SOLN
1.0000 mg | INTRAMUSCULAR | Status: DC | PRN
  Administered 2019-11-22: 1 mg via INTRAVENOUS
  Filled 2019-11-22: qty 2

## 2019-11-22 MED ORDER — FENTANYL CITRATE (PF) 100 MCG/2ML IJ SOLN: 50.0000 ug | Freq: Once | INTRAMUSCULAR | Status: AC

## 2019-11-22 MED ORDER — FENTANYL CITRATE (PF) 100 MCG/2ML IJ SOLN
INTRAMUSCULAR | Status: AC
  Administered 2019-11-22: 100 ug
  Filled 2019-11-22: qty 2

## 2019-11-22 MED ORDER — DEXTROSE 50 % IV SOLN
INTRAVENOUS | Status: AC
  Administered 2019-11-22: 25 mL
  Filled 2019-11-22: qty 50

## 2019-11-22 MED ORDER — FENTANYL CITRATE (PF) 100 MCG/2ML IJ SOLN
50.0000 ug | INTRAMUSCULAR | Status: DC | PRN
  Administered 2019-11-22: 100 ug via INTRAVENOUS
  Administered 2019-11-23 (×2): 50 ug via INTRAVENOUS
  Administered 2019-11-24 – 2019-11-29 (×9): 100 ug via INTRAVENOUS
  Administered 2019-11-30: 50 ug via INTRAVENOUS
  Administered 2019-11-30 – 2019-12-01 (×4): 100 ug via INTRAVENOUS
  Administered 2019-12-01: 50 ug via INTRAVENOUS
  Administered 2019-12-01 – 2019-12-02 (×3): 100 ug via INTRAVENOUS
  Administered 2019-12-02: 50 ug via INTRAVENOUS
  Administered 2019-12-04: 100 ug via INTRAVENOUS
  Administered 2019-12-05 (×2): 50 ug via INTRAVENOUS
  Filled 2019-11-22 (×13): qty 2

## 2019-11-22 MED ORDER — FENTANYL CITRATE (PF) 100 MCG/2ML IJ SOLN: 50.0000 ug | INTRAMUSCULAR | Status: DC | PRN

## 2019-11-22 NOTE — Progress Notes (Signed)
eLink Physician-Brief Progress Note Patient Name: Dashon Mcintire Diaz DOB: [birth year] MRN: [id number]   Date of Service  11/22/2019  HPI/Events of Note  Agitation - Hypertension and tachypnea persist. No improvement with Versed.   eICU Interventions  Will order: 1. D/C Versed. 2. Fentanyl 50-100 mcg IV Q 1 hour PRN agitation.      Intervention Category Major Interventions: Delirium, psychosis, severe agitation - evaluation and management  Elanore Talcott Eugene 11/22/2019, 8:30 PM
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
## 2019-11-22 NOTE — Progress Notes (Signed)
RT note: RT assisted DR Agarwala with bedside bronhcoscopy and re intubated patient do to occluded tube. CXR already taken and positive easy cap verified et tube placement.

## 2019-11-22 NOTE — Progress Notes (Signed)
eLink Physician-Brief Progress Note Patient Name: [name] DOB: 11-09-1955 MRN: [id number]   Date of Service  11/22/2019  HPI/Events of Note  Agitation - RR in high 30's to low 40's. Decreased sats = 91% and Hypertension BP = 150/82. Currently on 55% and PEEP 12. All sedation was D/Ced today. Really not a candidate to liberate from mechanical ventilation on those settings.   eICU Interventions  Will order: 1. Versed 1 mg IV Q 2 hours PRN agitation.      Intervention Category Major Interventions: Delirium, psychosis, severe agitation - evaluation and management  [name] 11/22/2019, 7:22 PM

## 2019-11-22 NOTE — Progress Notes (Signed)
MD to bedside for new crepitus, increasing peak pressures on the ventilator and poor ventilation. Unable to properly ventilate or suction via ETT, bronch and ETT replacement.   RT, RN, and MD at bedside.

## 2019-11-22 NOTE — Procedures (Signed)
Intubation Procedure Note [name] [id number] 12/08/1955  Procedure: Intubation Indications: ETT needed to be changed  Procedure Details Consent: Unable to obtain consent because of emergent medical necessity. Time Out: Verified patient identification, verified procedure, site/side was marked, verified correct patient position, special equipment/implants available, medications/allergies/relevent history reviewed, required imaging and test results available.  Performed  Tube was exchanged over a blue bougie.  An 8.0 mm tube was placed.  Tube position was confirmed bronchoscopically.    Evaluation Hemodynamic Status: BP stable throughout; O2 sats: transiently fell during during procedure Patient's Current Condition: stable Complications: No apparent complications Patient did tolerate procedure well. Chest X-ray ordered to verify placement.  CXR: tube position acceptable.   [name] [name] 11/22/2019

## 2019-11-22 NOTE — Progress Notes (Signed)
Wife updated on patient status and plan of care. Dicussed events this morning with the ETT and ventilator, all questions answered at this time.

## 2019-11-22 NOTE — Progress Notes (Signed)
Inpatient Diabetes Program Recommendations  AACE/ADA: New Consensus Statement on Inpatient Glycemic Control (2015)  Target Ranges:  Prepandial:   less than 140 mg/dL      Peak postprandial:   less than 180 mg/dL (1-2 hours)      Critically ill patients:  140 - 180 mg/dL   Lab Results  Component Value Date   GLUCAP 67 (L) 11/22/2019   HGBA1C 7.5 (H) 11/15/2019    Review of Glycemic Control Results for [name], [name] (MRN [id number]) as of 11/22/2019 10:48  Ref. Range 11/21/2019 03:18 11/21/2019 07:45 11/21/2019 11:28 11/21/2019 15:02 11/21/2019 19:22 11/21/2019 23:04 11/22/2019 03:34 11/22/2019 07:28  Glucose-Capillary Latest Ref Range: 70 - 99 mg/dL 84 242 (H) 998 (H) 069 (H) 117 (H) 177 (H) 122 (H) 67 (L)   Diabetes history: DM 2 Outpatient Diabetes medications: Metformin 1000 mg bid Current orders for Inpatient glycemic control: Lantus 25 units bid Novolog 0-15 units Q4 hours Novolog 5 units Q4 hours Tube Feed Coverage Tradjenta 5 mg Daily  Vital AF 64ml/hour  Solumedrol 80 mg Q8 last dose 4/17 Solumedrol 40 mg Q24 hours starting on 4/17  Inpatient Diabetes Program Recommendations:    Hypoglycemia 67 this am. Consider decreasing Lantus to 20 units bid. Note steroids dose decreasing over the next 2 days.  Thanks,  Evan Deem RN, MSN, BC-ADM Inpatient Diabetes Coordinator Team Pager [phone] (8a-5p)

## 2019-11-22 NOTE — Procedures (Signed)
Bronchoscopy Procedure Note Rush Salce Korinek [id number] April 25, 1956  Procedure: Bronchoscopy Indications: Diagnostic evaluation of the airways  Procedure Details Consent: Unable to obtain consent because of emergent medical necessity. Time Out: Verified patient identification, verified procedure, site/side was marked, verified correct patient position, special equipment/implants available, medications/allergies/relevent history reviewed, required imaging and test results available.  Performed  In preparation for procedure, patient was given 100% FiO2 and bronchoscope lubricated. Sedation: Benzodiazepines and Muscle relaxants  Airway entered and the following bronchi were examined: RUL, RML, RLL, LUL, LLL and Bronchi.   Procedures performed: Obstructed endotracheal tube confirmed.  Post tube exchange position of new tube confirmed bronchoscopically.  Minimal blood-tinged proximal airway secretions aspirated. Bronchoscope removed.    Evaluation Hemodynamic Status: Transient hypotension treated with pressors; O2 sats: transiently fell during during procedure Patient's Current Condition: stable Specimens:  None Complications: No apparent complications Patient  tolerate procedure well.   Daleen Bo Malachy Coleman 11/22/2019

## 2019-11-23 LAB — COMPREHENSIVE METABOLIC PANEL
ALT: 31 U/L (ref 0–44)
AST: 30 U/L (ref 15–41)
Albumin: 2.2 g/dL — ABNORMAL LOW (ref 3.5–5.0)
Alkaline Phosphatase: 41 U/L (ref 38–126)
Anion gap: 6 (ref 5–15)
BUN: 49 mg/dL — ABNORMAL HIGH (ref 8–23)
CO2: 33 mmol/L — ABNORMAL HIGH (ref 22–32)
Calcium: 8 mg/dL — ABNORMAL LOW (ref 8.9–10.3)
Chloride: 107 mmol/L (ref 98–111)
Creatinine, Ser: 0.72 mg/dL (ref 0.61–1.24)
GFR calc Af Amer: >60 mL/min (ref >60)
GFR calc non Af Amer: >60 mL/min (ref >60)
Glucose, Bld: 165 mg/dL — ABNORMAL HIGH (ref 70–99)
Potassium: 3.7 mmol/L (ref 3.5–5.1)
Sodium: 146 mmol/L — ABNORMAL HIGH (ref 135–145)
Total Bilirubin: 1.1 mg/dL (ref 0.3–1.2)
Total Protein: 4.6 g/dL — ABNORMAL LOW (ref 6.5–8.1)

## 2019-11-23 LAB — GLUCOSE, CAPILLARY
Glucose-Capillary: 117 mg/dL — ABNORMAL HIGH (ref 70–99)
Glucose-Capillary: 124 mg/dL — ABNORMAL HIGH (ref 70–99)
Glucose-Capillary: 160 mg/dL — ABNORMAL HIGH (ref 70–99)
Glucose-Capillary: 161 mg/dL — ABNORMAL HIGH (ref 70–99)
Glucose-Capillary: 172 mg/dL — ABNORMAL HIGH (ref 70–99)
Glucose-Capillary: 174 mg/dL — ABNORMAL HIGH (ref 70–99)
Glucose-Capillary: 78 mg/dL (ref 70–99)

## 2019-11-23 LAB — TRIGLYCERIDES: Triglycerides: 114 mg/dL (ref <150)

## 2019-11-23 MED ORDER — FREE WATER
300.0000 mL | Freq: Four times a day (QID) | Status: DC
  Administered 2019-11-23 – 2019-11-24 (×7): 300 mL

## 2019-11-23 MED ORDER — OXYCODONE HCL 5 MG PO TABS
5.0000 mg | ORAL_TABLET | Freq: Four times a day (QID) | ORAL | Status: DC
  Administered 2019-11-23 – 2019-11-26 (×11): 5 mg via ORAL
  Filled 2019-11-23 (×11): qty 1

## 2019-11-23 MED ORDER — ENOXAPARIN SODIUM 40 MG/0.4ML ~~LOC~~ SOLN
40.0000 mg | Freq: Two times a day (BID) | SUBCUTANEOUS | Status: DC
  Administered 2019-11-23 – 2019-12-05 (×25): 40 mg via SUBCUTANEOUS
  Filled 2019-11-23 (×25): qty 0.4

## 2019-11-23 MED ORDER — PROPOFOL 1000 MG/100ML IV EMUL
5.0000 ug/kg/min | INTRAVENOUS | Status: AC
  Administered 2019-11-23: 5 ug/kg/min via INTRAVENOUS
  Administered 2019-11-24 – 2019-11-25 (×3): 10 ug/kg/min via INTRAVENOUS
  Filled 2019-11-23 (×6): qty 100

## 2019-11-23 MED ORDER — POTASSIUM CHLORIDE 20 MEQ/15ML (10%) PO SOLN
40.0000 meq | Freq: Once | ORAL | Status: AC
  Administered 2019-11-23: 40 meq via ORAL
  Filled 2019-11-23: qty 30

## 2019-11-23 MED ORDER — QUETIAPINE FUMARATE 25 MG PO TABS
25.0000 mg | ORAL_TABLET | Freq: Two times a day (BID) | ORAL | Status: DC
  Administered 2019-11-23 – 2019-11-26 (×7): 25 mg via ORAL
  Filled 2019-11-23 (×7): qty 1

## 2019-11-23 MED ORDER — CLONAZEPAM 1 MG PO TABS
2.0000 mg | ORAL_TABLET | Freq: Two times a day (BID) | ORAL | Status: DC
  Administered 2019-11-23 – 2019-12-01 (×18): 2 mg via ORAL
  Filled 2019-11-23 (×18): qty 2

## 2019-11-23 NOTE — Progress Notes (Signed)
Spoke to wife on phone, updated on patient status and plan of care.
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
## 2019-11-24 ENCOUNTER — Inpatient Hospital Stay (HOSPITAL_COMMUNITY): Payer: Medicare Other

## 2019-11-24 LAB — CBC
HCT: 38.7 % — ABNORMAL LOW (ref 39.0–52.0)
Hemoglobin: 12 g/dL — ABNORMAL LOW (ref 13.0–17.0)
MCH: 29.9 pg (ref 26.0–34.0)
MCHC: 31 g/dL (ref 30.0–36.0)
MCV: 96.5 fL (ref 80.0–100.0)
Platelets: 119 10*3/uL — ABNORMAL LOW (ref 150–400)
RBC: 4.01 MIL/uL — ABNORMAL LOW (ref 4.22–5.81)
RDW: 13.3 % (ref 11.5–15.5)
WBC: 9.5 10*3/uL (ref 4.0–10.5)
nRBC: 0 % (ref 0.0–0.2)

## 2019-11-24 LAB — BASIC METABOLIC PANEL
Anion gap: 5 (ref 5–15)
BUN: 49 mg/dL — ABNORMAL HIGH (ref 8–23)
CO2: 32 mmol/L (ref 22–32)
Calcium: 8.5 mg/dL — ABNORMAL LOW (ref 8.9–10.3)
Chloride: 109 mmol/L (ref 98–111)
Creatinine, Ser: 0.6 mg/dL — ABNORMAL LOW (ref 0.61–1.24)
GFR calc Af Amer: >60 mL/min (ref >60)
GFR calc non Af Amer: >60 mL/min (ref >60)
Glucose, Bld: 160 mg/dL — ABNORMAL HIGH (ref 70–99)
Potassium: 4.4 mmol/L (ref 3.5–5.1)
Sodium: 146 mmol/L — ABNORMAL HIGH (ref 135–145)

## 2019-11-24 LAB — GLUCOSE, CAPILLARY
Glucose-Capillary: 143 mg/dL — ABNORMAL HIGH (ref 70–99)
Glucose-Capillary: 118 mg/dL — ABNORMAL HIGH (ref 70–99)
Glucose-Capillary: 150 mg/dL — ABNORMAL HIGH (ref 70–99)
Glucose-Capillary: 104 mg/dL — ABNORMAL HIGH (ref 70–99)
Glucose-Capillary: 131 mg/dL — ABNORMAL HIGH (ref 70–99)
Glucose-Capillary: 174 mg/dL — ABNORMAL HIGH (ref 70–99)

## 2019-11-24 LAB — MAGNESIUM: Magnesium: 2.5 mg/dL — ABNORMAL HIGH (ref 1.7–2.4)

## 2019-11-24 LAB — PHOSPHORUS: Phosphorus: 4 mg/dL (ref 2.5–4.6)

## 2019-11-24 MED ORDER — ACETAZOLAMIDE 250 MG PO TABS
500.0000 mg | ORAL_TABLET | Freq: Once | ORAL | Status: AC
  Administered 2019-11-24: 500 mg via ORAL
  Filled 2019-11-24: qty 2

## 2019-11-24 MED ORDER — METOLAZONE 5 MG PO TABS
5.0000 mg | ORAL_TABLET | Freq: Once | ORAL | Status: AC
  Administered 2019-11-24: 5 mg via ORAL
  Filled 2019-11-24: qty 1
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
## 2019-11-25 ENCOUNTER — Inpatient Hospital Stay (HOSPITAL_COMMUNITY): Payer: Medicare Other

## 2019-11-25 DIAGNOSIS — R0603 Acute respiratory distress: Secondary | ICD-10-CM | POA: Diagnosis not present

## 2019-11-25 LAB — CBC
HCT: 44 % (ref 39.0–52.0)
Hemoglobin: 13.6 g/dL (ref 13.0–17.0)
MCH: 29.1 pg (ref 26.0–34.0)
MCHC: 30.9 g/dL (ref 30.0–36.0)
MCV: 94 fL (ref 80.0–100.0)
Platelets: 133 10*3/uL — ABNORMAL LOW (ref 150–400)
RBC: 4.68 MIL/uL (ref 4.22–5.81)
RDW: 13.3 % (ref 11.5–15.5)
WBC: 12.7 10*3/uL — ABNORMAL HIGH (ref 4.0–10.5)
nRBC: 0 % (ref 0.0–0.2)

## 2019-11-25 LAB — GLUCOSE, CAPILLARY
Glucose-Capillary: 98 mg/dL (ref 70–99)
Glucose-Capillary: 67 mg/dL — ABNORMAL LOW (ref 70–99)
Glucose-Capillary: 120 mg/dL — ABNORMAL HIGH (ref 70–99)
Glucose-Capillary: 94 mg/dL (ref 70–99)
Glucose-Capillary: 73 mg/dL (ref 70–99)
Glucose-Capillary: 206 mg/dL — ABNORMAL HIGH (ref 70–99)
Glucose-Capillary: 276 mg/dL — ABNORMAL HIGH (ref 70–99)

## 2019-11-25 LAB — BASIC METABOLIC PANEL
Anion gap: 10 (ref 5–15)
BUN: 36 mg/dL — ABNORMAL HIGH (ref 8–23)
CO2: 29 mmol/L (ref 22–32)
Calcium: 8.8 mg/dL — ABNORMAL LOW (ref 8.9–10.3)
Chloride: 101 mmol/L (ref 98–111)
Creatinine, Ser: 0.52 mg/dL — ABNORMAL LOW (ref 0.61–1.24)
GFR calc Af Amer: >60 mL/min (ref >60)
GFR calc non Af Amer: >60 mL/min (ref >60)
Glucose, Bld: 113 mg/dL — ABNORMAL HIGH (ref 70–99)
Potassium: 4.3 mmol/L (ref 3.5–5.1)
Sodium: 140 mmol/L (ref 135–145)

## 2019-11-25 LAB — MAGNESIUM: Magnesium: 2.3 mg/dL (ref 1.7–2.4)

## 2019-11-25 LAB — PHOSPHORUS: Phosphorus: 5.2 mg/dL — ABNORMAL HIGH (ref 2.5–4.6)

## 2019-11-25 MED ORDER — VECURONIUM BROMIDE 10 MG IV SOLR
INTRAVENOUS | Status: AC
  Administered 2019-11-25: 10 mg via INTRAVENOUS
  Filled 2019-11-25: qty 10

## 2019-11-25 MED ORDER — FUROSEMIDE 10 MG/ML IJ SOLN: 40.0000 mg | Freq: Two times a day (BID) | INTRAMUSCULAR | Status: DC

## 2019-11-25 MED ORDER — VECURONIUM BROMIDE 10 MG IV SOLR: 10.0000 mg | Freq: Once | INTRAVENOUS | Status: AC

## 2019-11-25 MED ORDER — FREE WATER
100.0000 mL | Status: DC
  Administered 2019-11-25 – 2019-12-30 (×194): 100 mL

## 2019-11-25 MED ORDER — MIDAZOLAM HCL 2 MG/2ML IJ SOLN: 2.0000 mg | Freq: Once | INTRAMUSCULAR | Status: DC

## 2019-11-25 MED ORDER — ETOMIDATE 2 MG/ML IV SOLN
INTRAVENOUS | Status: AC
  Administered 2019-11-25: 10 mg via INTRAVENOUS
  Filled 2019-11-25: qty 10

## 2019-11-25 MED ORDER — INSULIN GLARGINE 100 UNIT/ML ~~LOC~~ SOLN
10.0000 [IU] | Freq: Every day | SUBCUTANEOUS | Status: DC
  Administered 2019-11-25: 10 [IU] via SUBCUTANEOUS
  Filled 2019-11-25 (×2): qty 0.1

## 2019-11-25 MED ORDER — INSULIN GLARGINE 100 UNIT/ML ~~LOC~~ SOLN
10.0000 [IU] | Freq: Every day | SUBCUTANEOUS | Status: DC
  Filled 2019-11-25: qty 0.1

## 2019-11-25 MED ORDER — METOLAZONE 5 MG PO TABS
5.0000 mg | ORAL_TABLET | Freq: Once | ORAL | Status: AC
  Administered 2019-11-25: 5 mg via ORAL
  Filled 2019-11-25: qty 1

## 2019-11-25 MED ORDER — DEXTROSE 50 % IV SOLN
12.5000 g | Freq: Once | INTRAVENOUS | Status: AC
  Administered 2019-11-25: 08:00:00 12.5 g via INTRAVENOUS
  Filled 2019-11-25: qty 50

## 2019-11-25 MED ORDER — ETOMIDATE 2 MG/ML IV SOLN: 10.0000 mg | Freq: Once | INTRAVENOUS | Status: AC
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
## 2019-11-25 NOTE — Procedures (Addendum)
Procedure: Percutaneous Tracheostomy CPT 31600 Performed by: Dr. Lynnell Catalan Bronchoscopy Assistant: Icard, DO.  Indications: Chronic respiratory failure and need for ongoing mechanical ventilation.  Consent: Signed in chart  Preprocedure: Universal protocol was followed for this procedure. Timeout performed. Anterior neck prepped and draped.  Anesthesia: The patient was intubated and sedated prior to the procedure. Additional midazolam, etomidate, fentanyl and vecuronium were given for sedation and paralysis with close attention to vital signs throughout procedure.   Procedure: The patient was placed in the supine position. The anterior neck was prepped and draped in usual sterile fashion. 1% lidocaine was administered approximately 2 fingerbreadths above the sternal notch for local anesthesia. A 1.5-cm vertical incision was then performed 2 fingerbreadths above the sternal notch. Using a curved Kelly, blunt dissection was performed down to the level of the pretracheal fascia. At this point, the bronchoscope was introduced through the endotracheal tube and the trachea was properly visualized. The endotracheal tube was then gradually withdrawn within the trachea under direct bronchoscopic visualization. Proper midline position was confirmed by bouncing the needle from the tracheostomy tray over the trachea with bronchoscopic examination. The needle was advanced into the trachea and proper positioning was confirmed with direct visualization. The needle was then removed leaving a white outer cannula in position. The wire from the tracheostomy tray was then advanced through the white outer cannula. The cannula was then removed. The small, blue dilator was then advanced over the wire into the trachea. Once proper dilatation was achieved, the dilator was removed. The large, tapered dilator was then advanced over the wire into the trachea. The dilator was removed leaving the wire and white inner cannula in  position. A number 6 percutaneous Shiley tracheostomy tube was then advanced over the wire and white inner cannula into the trachea. Proper positioning was confirmed with bronchoscopic visualization. The tracheostomy tube was then sutured in place with four nylon sutures. It was further secured with a tracheostomy tie.  Estimated blood loss: Less than 5 mL.  Complications: None immediate.  CXR ordered.  I was proctored by Dr Rhodia Albright, who was present throughout the procedure.  Lynnell Catalan, MD [facility] ICU Physician [facility] [place] [facility]  Pager: [phone] Mobile: [phone] After hours: [phone].  11/25/2019, 2:15 PM

## 2019-11-25 NOTE — Progress Notes (Signed)
Pt having some hypoglycemic episodes today will stop scheduled novolog and decrease lantus to 10U from 25U.

## 2019-11-25 NOTE — Progress Notes (Signed)
eLink Physician-Brief Progress Note Patient Name: Tayvin Preslar Muhlestein DOB: Feb 09, 1956 MRN: [id number]   Date of Service  11/25/2019  HPI/Events of Note  On TF.   eICU Interventions  HOLD TF, possibly going for trach in AM.       Intervention Category Minor Interventions: Other:  Ranee Gosselin 11/25/2019, 1:46 AM

## 2019-11-25 NOTE — Plan of Care (Signed)
Plan of care  Asked to assess patient at bedside. Patient getting good tidal volumes and sats in the 90's on 100%. No cuff leak however does have 60 cm of water pressure in balloon. Patient obviously tachpneic and peak pressures in low 30's and upper 20's. This all occurred after pateint was suctioned and very agitated. Think that increased need for pressure to avoid leak is transient 2/2 agitation causing increased intrathoracic pressures. Have asked bedside nurse to  Continue to increase sedation as tolerated.As patient tachypneic there is risk of airtrapping. If patient were to decompensate and still tachypneic would consider disconnecting  from vent as per bedside nurse this helped previously.  Vertis Kelch DO Internal Medicine/Pediatrics Pulmonary and Critical Care Fellow PGY-6

## 2019-11-25 NOTE — Progress Notes (Signed)
eLink Physician-Brief Progress Note Patient Name: [name] DOB: 07-11-1956 MRN: [id number]   Date of Service  11/25/2019  HPI/Events of Note  Patient trached today. Now having issues with volume required to prevent cuff leak.   eICU Interventions  Will ask the ground team to evaluate the patient at bedside.      Intervention Category Major Interventions: Other:  Lenell Antu 11/25/2019, 9:18 PM

## 2019-11-25 NOTE — Procedures (Signed)
Bronchoscopy Procedure Note [name] [id number] 1956/03/25  Procedure: Bronchoscopy Indications: Diagnostic evaluation of the airways, visualization for bronchoscopy   Procedure Details Consent: Risks of procedure as well as the alternatives and risks of each were explained to the (patient/caregiver).  Consent for procedure obtained. Time Out: Verified patient identification, verified procedure, site/side was marked, verified correct patient position, special equipment/implants available, medications/allergies/relevent history reviewed, required imaging and test results available.  Performed  In preparation for procedure, patient was given 100% FiO2 and bronchoscope lubricated. Sedation: Benzodiazepines, Muscle relaxants and Etomidate  Airway entered and the following bronchi were examined: RUL, RML, RLL, LUL, LLL and Bronchi.   Procedures performed diagnostic evaluation of all airways.  Minimal mucus within the lower lobes were suctioned clear.  All airways patent  No endobronchial lesion.  The ETT was retracted to 17cm. The anterior trachea was visualized. The tracheostomy tube placed under direct visualization and there was no damage to the posterior wall of the trachea.   Successful percutaneous placement of tracheostomy was confirmed and bronchoscope was entered into the airway through the new tracheostomy hole. Patient tolerated th procedure well.   Bronchoscope removed.    Evaluation Hemodynamic Status: BP stable throughout; O2 sats: stable throughout Patient's Current Condition: stable Specimens:  None Complications: No apparent complications Patient did tolerate procedure well.   Rachel Bo Rylah Fukuda 11/25/2019

## 2019-11-26 LAB — CBC
HCT: 39.8 % (ref 39.0–52.0)
Hemoglobin: 12.7 g/dL — ABNORMAL LOW (ref 13.0–17.0)
MCH: 29.5 pg (ref 26.0–34.0)
MCHC: 31.9 g/dL (ref 30.0–36.0)
MCV: 92.3 fL (ref 80.0–100.0)
Platelets: 119 10*3/uL — ABNORMAL LOW (ref 150–400)
RBC: 4.31 MIL/uL (ref 4.22–5.81)
RDW: 13.2 % (ref 11.5–15.5)
WBC: 13 10*3/uL — ABNORMAL HIGH (ref 4.0–10.5)
nRBC: 0 % (ref 0.0–0.2)

## 2019-11-26 LAB — BASIC METABOLIC PANEL
Anion gap: 11 (ref 5–15)
BUN: 40 mg/dL — ABNORMAL HIGH (ref 8–23)
CO2: 30 mmol/L (ref 22–32)
Calcium: 8.8 mg/dL — ABNORMAL LOW (ref 8.9–10.3)
Chloride: 93 mmol/L — ABNORMAL LOW (ref 98–111)
Creatinine, Ser: 0.6 mg/dL — ABNORMAL LOW (ref 0.61–1.24)
GFR calc Af Amer: >60 mL/min (ref >60)
GFR calc non Af Amer: >60 mL/min (ref >60)
Glucose, Bld: 266 mg/dL — ABNORMAL HIGH (ref 70–99)
Potassium: 4.1 mmol/L (ref 3.5–5.1)
Sodium: 134 mmol/L — ABNORMAL LOW (ref 135–145)

## 2019-11-26 LAB — GLUCOSE, CAPILLARY
Glucose-Capillary: 268 mg/dL — ABNORMAL HIGH (ref 70–99)
Glucose-Capillary: 165 mg/dL — ABNORMAL HIGH (ref 70–99)
Glucose-Capillary: 135 mg/dL — ABNORMAL HIGH (ref 70–99)
Glucose-Capillary: 146 mg/dL — ABNORMAL HIGH (ref 70–99)
Glucose-Capillary: 231 mg/dL — ABNORMAL HIGH (ref 70–99)
Glucose-Capillary: 326 mg/dL — ABNORMAL HIGH (ref 70–99)

## 2019-11-26 LAB — MAGNESIUM: Magnesium: 2.1 mg/dL (ref 1.7–2.4)

## 2019-11-26 LAB — PHOSPHORUS: Phosphorus: 3.3 mg/dL (ref 2.5–4.6)

## 2019-11-26 MED ORDER — INSULIN GLARGINE 100 UNIT/ML ~~LOC~~ SOLN
25.0000 [IU] | Freq: Every day | SUBCUTANEOUS | Status: DC
  Administered 2019-11-26 – 2019-11-27 (×2): 25 [IU] via SUBCUTANEOUS
  Filled 2019-11-26 (×2): qty 0.25

## 2019-11-26 MED ORDER — METOLAZONE 5 MG PO TABS
5.0000 mg | ORAL_TABLET | Freq: Once | ORAL | Status: AC
  Administered 2019-11-26: 5 mg via ORAL
  Filled 2019-11-26: qty 1

## 2019-11-26 MED ORDER — QUETIAPINE FUMARATE 50 MG PO TABS
50.0000 mg | ORAL_TABLET | Freq: Two times a day (BID) | ORAL | Status: DC
  Administered 2019-11-26 – 2019-12-01 (×11): 50 mg via ORAL
  Filled 2019-11-26 (×12): qty 1

## 2019-11-26 MED ORDER — MIDAZOLAM HCL (PF) 10 MG/2ML IJ SOLN: 2.0000 mg | INTRAMUSCULAR | Status: DC | PRN

## 2019-11-26 MED ORDER — VITAL AF 1.2 CAL PO LIQD
1000.0000 mL | ORAL | Status: DC
  Administered 2019-11-26 – 2019-12-17 (×22): 1000 mL

## 2019-11-26 MED ORDER — PRO-STAT SUGAR FREE PO LIQD
30.0000 mL | Freq: Every day | ORAL | Status: DC
  Administered 2019-11-27 – 2020-01-03 (×37): 30 mL
  Filled 2019-11-26 (×37): qty 30

## 2019-11-26 MED ORDER — OXYCODONE HCL 5 MG PO TABS
7.5000 mg | ORAL_TABLET | Freq: Four times a day (QID) | ORAL | Status: DC
  Administered 2019-11-26 – 2019-11-28 (×9): 7.5 mg
  Filled 2019-11-26 (×9): qty 2

## 2019-11-26 MED ORDER — NOREPINEPHRINE 4 MG/250ML-% IV SOLN
0.0000 ug/min | INTRAVENOUS | Status: DC
  Administered 2019-11-26: 5 ug/min via INTRAVENOUS
  Administered 2019-11-27: 2 ug/min via INTRAVENOUS
  Filled 2019-11-26 (×2): qty 250

## 2019-11-26 MED ORDER — MIDAZOLAM HCL 2 MG/2ML IJ SOLN
2.0000 mg | INTRAMUSCULAR | Status: DC | PRN
  Administered 2019-11-26 – 2019-12-06 (×3): 2 mg via INTRAVENOUS
  Filled 2019-11-26 (×4): qty 2

## 2019-11-26 MED ORDER — IPRATROPIUM-ALBUTEROL 0.5-2.5 (3) MG/3ML IN SOLN: 3.0000 mL | Freq: Four times a day (QID) | RESPIRATORY_TRACT | Status: DC | PRN

## 2019-11-26 MED ORDER — SODIUM CHLORIDE 0.9 % IV BOLUS
500.0000 mL | Freq: Once | INTRAVENOUS | Status: AC
  Administered 2019-11-26: 14:00:00 500 mL via INTRAVENOUS

## 2019-11-26 MED ORDER — INSULIN GLARGINE 100 UNIT/ML ~~LOC~~ SOLN
25.0000 [IU] | Freq: Every day | SUBCUTANEOUS | Status: DC
  Administered 2019-11-26: 22:00:00 25 [IU] via SUBCUTANEOUS
  Filled 2019-11-26 (×2): qty 0.25
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
## 2019-11-26 NOTE — Progress Notes (Signed)
SLP Cancellation Note  Patient Details Name: [name] MRN: [id number] DOB: 1955/12/12   Cancelled treatment:        Trach'd yesterday. Vent and having difficulty tolerating per progress note. Will continue to check progress.    [name] 11/26/2019, 8:28 AM    [name].Ed Nurse, children's [phone] Office [phone]

## 2019-11-26 NOTE — Progress Notes (Signed)
Pt with decreased bp had given pretty aggressive diuresis over the past few days with net negative results so attempted bolus without any improvement.   Started low dose norepi. to avoid further fluid challenges at this time.  Minimizing sedation as able

## 2019-11-26 NOTE — Progress Notes (Signed)
Nutrition Follow-up  DOCUMENTATION CODES:   Not applicable  INTERVENTION:   Tube Feeding:  Vital AF 1.2 at 65 ml/hr Pro-Stat 30 mL daily Provides 1972 kcals, 132 g of protein and 1264 mL of free water   NUTRITION DIAGNOSIS:   Inadequate oral intake related to inability to eat, acute illness as evidenced by NPO status.  Being addressed via TF  GOAL:   Patient will meet greater than or equal to 90% of their needs  Progressing  MONITOR:   Vent status, Labs, Weight trends, TF tolerance  REASON FOR ASSESSMENT:   Ventilator, Consult Enteral/tube feeding initiation and management  ASSESSMENT:   64 yo male with ARDS secondary to COVID-19 pnuemonia requiring intubation, acute encephalopathy with hx of heavy EtOH abuse, AKI. PMH includes DM, HTN, CAD s/p PCI/CABG  4/02 Admitted 4/05 Intubated, Paralyzed, Prone 4/06 TF initiated via OG tube 4/07 Cortrak 4/19 Trach placed  Proned and paralyzed from 4/05-4/09, required sedation and paralytics for vent dyssynchrony until 4/12  Turning propofol off today  Vital AF 1.2 at 40 ml/hr, free water flush 100 mL q 4 hours  Labs: sodium 134(L), CBGs 73-276 Meds: ss novolog, lantus, solumedrol, tradjenta, MVI with minerals, thiamine  Diet Order:   Diet Order            Diet NPO time specified  Diet effective now              EDUCATION NEEDS:   Not appropriate for education at this time  Skin:  Skin Assessment: Skin Integrity Issues: Skin Integrity Issues:: DTI DTI: lip  Last BM:  4/20 rectal tube  Height:   Ht Readings from Last 1 Encounters:  11/11/19 5\' 8"  (1.727 m)    Weight:   Wt Readings from Last 1 Encounters:  11/26/19 85 kg    BMI:  Body mass index is 28.49 kg/m.  Estimated Nutritional Needs:   Kcal:  1950-2270 kcals  Protein:  120-145 g  Fluid:  >/= 2 L   11/28/19 MS, RDN, LDN, CNSC RD Pager Number and Weekend/On-Call After Hours Pager Located in [place]

## 2019-11-26 NOTE — Progress Notes (Signed)
PT Cancellation Note  Patient Details Name: [name] MRN: [id number] DOB: [birth year]   Cancelled Treatment:    Reason Eval/Treat Not Completed: Patient not medically ready.  Pt's status is still tenous. 11/26/2019  Jacinto Halim., PT Acute Rehabilitation Services [phone]  (pager) [phone]  (office)   [name] 11/26/2019, 5:20 PM

## 2019-11-27 ENCOUNTER — Inpatient Hospital Stay (HOSPITAL_COMMUNITY): Payer: Medicare Other

## 2019-11-27 DIAGNOSIS — D696 Thrombocytopenia, unspecified: Secondary | ICD-10-CM

## 2019-11-27 DIAGNOSIS — D649 Anemia, unspecified: Secondary | ICD-10-CM

## 2019-11-27 DIAGNOSIS — J95851 Ventilator associated pneumonia: Secondary | ICD-10-CM

## 2019-11-27 LAB — BASIC METABOLIC PANEL
Anion gap: 6 (ref 5–15)
BUN: 32 mg/dL — ABNORMAL HIGH (ref 8–23)
CO2: 35 mmol/L — ABNORMAL HIGH (ref 22–32)
Calcium: 8.4 mg/dL — ABNORMAL LOW (ref 8.9–10.3)
Chloride: 94 mmol/L — ABNORMAL LOW (ref 98–111)
Creatinine, Ser: 0.55 mg/dL — ABNORMAL LOW (ref 0.61–1.24)
GFR calc Af Amer: >60 mL/min (ref >60)
GFR calc non Af Amer: >60 mL/min (ref >60)
Glucose, Bld: 230 mg/dL — ABNORMAL HIGH (ref 70–99)
Potassium: 3.8 mmol/L (ref 3.5–5.1)
Sodium: 135 mmol/L (ref 135–145)

## 2019-11-27 LAB — CBC
HCT: 34.5 % — ABNORMAL LOW (ref 39.0–52.0)
Hemoglobin: 11.2 g/dL — ABNORMAL LOW (ref 13.0–17.0)
MCH: 29.6 pg (ref 26.0–34.0)
MCHC: 32.5 g/dL (ref 30.0–36.0)
MCV: 91 fL (ref 80.0–100.0)
Platelets: 115 10*3/uL — ABNORMAL LOW (ref 150–400)
RBC: 3.79 MIL/uL — ABNORMAL LOW (ref 4.22–5.81)
RDW: 13.2 % (ref 11.5–15.5)
WBC: 12.5 10*3/uL — ABNORMAL HIGH (ref 4.0–10.5)
nRBC: 0 % (ref 0.0–0.2)

## 2019-11-27 LAB — GLUCOSE, CAPILLARY
Glucose-Capillary: 232 mg/dL — ABNORMAL HIGH (ref 70–99)
Glucose-Capillary: 170 mg/dL — ABNORMAL HIGH (ref 70–99)
Glucose-Capillary: 194 mg/dL — ABNORMAL HIGH (ref 70–99)
Glucose-Capillary: 189 mg/dL — ABNORMAL HIGH (ref 70–99)
Glucose-Capillary: 288 mg/dL — ABNORMAL HIGH (ref 70–99)

## 2019-11-27 LAB — MAGNESIUM: Magnesium: 1.8 mg/dL (ref 1.7–2.4)

## 2019-11-27 LAB — PHOSPHORUS: Phosphorus: 2.5 mg/dL (ref 2.5–4.6)

## 2019-11-27 MED ORDER — POLYETHYLENE GLYCOL 3350 17 G PO PACK
17.0000 g | PACK | Freq: Every day | ORAL | Status: DC
  Administered 2019-11-27 – 2019-12-01 (×5): 17 g via ORAL
  Filled 2019-11-27 (×5): qty 1

## 2019-11-27 MED ORDER — VANCOMYCIN HCL 1500 MG/300ML IV SOLN
1500.0000 mg | Freq: Once | INTRAVENOUS | Status: AC
  Administered 2019-11-27: 1500 mg via INTRAVENOUS
  Filled 2019-11-27: qty 300

## 2019-11-27 MED ORDER — PIPERACILLIN-TAZOBACTAM 3.375 G IVPB
3.3750 g | Freq: Three times a day (TID) | INTRAVENOUS | Status: AC
  Administered 2019-11-27 – 2019-12-03 (×20): 3.375 g via INTRAVENOUS
  Filled 2019-11-27 (×20): qty 50

## 2019-11-27 MED ORDER — INSULIN GLARGINE 100 UNIT/ML ~~LOC~~ SOLN
25.0000 [IU] | Freq: Two times a day (BID) | SUBCUTANEOUS | Status: DC
  Administered 2019-11-27 – 2019-11-28 (×3): 25 [IU] via SUBCUTANEOUS
  Filled 2019-11-27 (×5): qty 0.25

## 2019-11-27 MED ORDER — SENNOSIDES-DOCUSATE SODIUM 8.6-50 MG PO TABS
1.0000 | ORAL_TABLET | Freq: Every day | ORAL | Status: DC
  Administered 2019-11-27 – 2019-12-01 (×4): 1 via ORAL
  Filled 2019-11-27 (×4): qty 1

## 2019-11-27 MED ORDER — VANCOMYCIN HCL 1250 MG/250ML IV SOLN
1250.0000 mg | Freq: Two times a day (BID) | INTRAVENOUS | Status: DC
  Administered 2019-11-28 – 2019-11-29 (×3): 1250 mg via INTRAVENOUS
  Filled 2019-11-27 (×5): qty 250

## 2019-11-27 MED ORDER — IPRATROPIUM-ALBUTEROL 0.5-2.5 (3) MG/3ML IN SOLN
3.0000 mL | Freq: Four times a day (QID) | RESPIRATORY_TRACT | Status: DC
  Administered 2019-11-27 – 2019-11-28 (×3): 3 mL via RESPIRATORY_TRACT
  Filled 2019-11-27 (×3): qty 3

## 2019-11-27 MED FILL — Medication: Qty: 1 | Status: AC

## 2019-11-27 NOTE — Plan of Care (Signed)
I attempted to speak to Ms. Blocher regarding her husband's care, but was unable to reach her and left a message.  Steffanie Dunn, DO 11/27/19 2:41 PM [facility]
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
## 2019-11-27 NOTE — Progress Notes (Signed)
PT Cancellation Note  Patient Details Name: Knight Oelkers Breeding MRN: [id number] DOB: September 05, 1955   Cancelled Treatment:    Reason Eval/Treat Not Completed: Patient not medically ready.  Pt still tenuous and on the vent. 11/27/2019  Jacinto Halim., PT Acute Rehabilitation Services [phone]  (pager) [phone]  (office)   Eliseo Gum Bassy Fetterly 11/27/2019, 12:25 PM

## 2019-11-27 NOTE — Progress Notes (Signed)
Pharmacy Antibiotic Note  Diaz Crago Looman is a 64 y.o. male admitted on 11/22/2019 with shortness of breath.  Pharmacy has been consulted for vancomycin and zosyn dosing for pneumonia. Pt is afebrile and WBC is elevated at 12.5. Scr is WNL.   Plan: Vancomycin 1500mg  IV x 1 then 1250mg  IV Q12H Zosyn 3.375gm IV Q8H (4 hr inf) - noted PCN allergy but pt tolerated a course of zosyn in 2017 F/u renal fxn, C&S, clinical status and peak/trough at SS  Height: 5\' 8"  (172.7 cm) Weight: 85 kg (187 lb 6.3 oz) IBW/kg (Calculated) : 68.4  Temp (24hrs), Avg:99 F (37.2 C), Min:97.6 F (36.4 C), Max:100.8 F (38.2 C)  Recent Labs  Lab 11/23/19 0410 11/24/19 0327 11/25/19 0323 11/26/19 0411 11/27/19 0500  WBC  --  9.5 12.7* 13.0* 12.5*  CREATININE 0.72 0.60* 0.52* 0.60* 0.55*    Estimated Creatinine Clearance: 99 mL/min (A) (by C-G formula based on SCr of 0.55 mg/dL (L)).    Allergies  Allergen Reactions  . 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 unk Did it involve sudden or severe rash/hives, skin peeling, or any reaction on the inside of your mouth or nose?  unk Did you need to seek medical attention at a hospital or doctor's office? Yes When did it last happen?was a child If all above answers are "NO", Bianchini proceed with cephalosporin   . Niacin And Related Other (See Comments)    Reaction unknown    Antimicrobials this admission: Vanc 4/21>> Zosyn 4/21>> Azithro 4/3 >> 4/5 CTX 4/2 >> 4/7 Remdesivir 4/2 >> 4/6 Actemra 4/2 x 1  Dose adjustments this admission: N/A  Microbiology results: 4/5 TA - nf 4/2 bcx - neg 4/2 covid+ 4/12 TA >> normal flora  Thank you for allowing pharmacy to be a part of this patient's care.  Janesha Brissette, 6/6 11/27/2019 12:35 PM

## 2019-11-28 ENCOUNTER — Inpatient Hospital Stay (HOSPITAL_COMMUNITY): Payer: Medicare Other

## 2019-11-28 LAB — CBC
HCT: 32.7 % — ABNORMAL LOW (ref 39.0–52.0)
Hemoglobin: 10.3 g/dL — ABNORMAL LOW (ref 13.0–17.0)
MCH: 29.2 pg (ref 26.0–34.0)
MCHC: 31.5 g/dL (ref 30.0–36.0)
MCV: 92.6 fL (ref 80.0–100.0)
Platelets: 105 10*3/uL — ABNORMAL LOW (ref 150–400)
RBC: 3.53 MIL/uL — ABNORMAL LOW (ref 4.22–5.81)
RDW: 13.2 % (ref 11.5–15.5)
WBC: 7.1 10*3/uL (ref 4.0–10.5)
nRBC: 0 % (ref 0.0–0.2)

## 2019-11-28 LAB — BASIC METABOLIC PANEL
Anion gap: 10 (ref 5–15)
BUN: 29 mg/dL — ABNORMAL HIGH (ref 8–23)
CO2: 35 mmol/L — ABNORMAL HIGH (ref 22–32)
Calcium: 8.4 mg/dL — ABNORMAL LOW (ref 8.9–10.3)
Chloride: 90 mmol/L — ABNORMAL LOW (ref 98–111)
Creatinine, Ser: 0.53 mg/dL — ABNORMAL LOW (ref 0.61–1.24)
GFR calc Af Amer: >60 mL/min (ref >60)
GFR calc non Af Amer: >60 mL/min (ref >60)
Glucose, Bld: 264 mg/dL — ABNORMAL HIGH (ref 70–99)
Potassium: 4.4 mmol/L (ref 3.5–5.1)
Sodium: 135 mmol/L (ref 135–145)

## 2019-11-28 LAB — PHOSPHORUS: Phosphorus: 2.6 mg/dL (ref 2.5–4.6)

## 2019-11-28 LAB — GLUCOSE, CAPILLARY
Glucose-Capillary: 159 mg/dL — ABNORMAL HIGH (ref 70–99)
Glucose-Capillary: 169 mg/dL — ABNORMAL HIGH (ref 70–99)
Glucose-Capillary: 193 mg/dL — ABNORMAL HIGH (ref 70–99)
Glucose-Capillary: 205 mg/dL — ABNORMAL HIGH (ref 70–99)
Glucose-Capillary: 221 mg/dL — ABNORMAL HIGH (ref 70–99)
Glucose-Capillary: 246 mg/dL — ABNORMAL HIGH (ref 70–99)
Glucose-Capillary: 351 mg/dL — ABNORMAL HIGH (ref 70–99)

## 2019-11-28 LAB — MRSA PCR SCREENING: MRSA by PCR: NEGATIVE

## 2019-11-28 LAB — MAGNESIUM: Magnesium: 1.8 mg/dL (ref 1.7–2.4)

## 2019-11-28 MED ORDER — IPRATROPIUM-ALBUTEROL 0.5-2.5 (3) MG/3ML IN SOLN
3.0000 mL | RESPIRATORY_TRACT | Status: DC | PRN
  Administered 2019-12-06 – 2019-12-14 (×2): 3 mL via RESPIRATORY_TRACT
  Filled 2019-11-28 (×3): qty 3

## 2019-11-28 MED ORDER — OXYCODONE HCL 5 MG PO TABS
5.0000 mg | ORAL_TABLET | Freq: Four times a day (QID) | ORAL | Status: DC
  Administered 2019-11-28 – 2019-12-02 (×15): 5 mg
  Filled 2019-11-28 (×15): qty 1

## 2019-11-28 NOTE — Progress Notes (Signed)
Spoke with wife on phone updating on patient status and plan of care. All questions at this time.
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
## 2019-11-29 ENCOUNTER — Inpatient Hospital Stay: Payer: Self-pay

## 2019-11-29 DIAGNOSIS — R0602 Shortness of breath: Secondary | ICD-10-CM | POA: Diagnosis not present

## 2019-11-29 LAB — CULTURE, RESPIRATORY W GRAM STAIN: Culture: NORMAL

## 2019-11-29 LAB — CBC WITH DIFFERENTIAL/PLATELET
Abs Immature Granulocytes: 0.13 10*3/uL — ABNORMAL HIGH (ref 0.00–0.07)
Basophils Absolute: 0 10*3/uL (ref 0.0–0.1)
Basophils Relative: 0 %
Eosinophils Absolute: 0.5 10*3/uL (ref 0.0–0.5)
Eosinophils Relative: 7 %
HCT: 34.5 % — ABNORMAL LOW (ref 39.0–52.0)
Hemoglobin: 11 g/dL — ABNORMAL LOW (ref 13.0–17.0)
Immature Granulocytes: 2 %
Lymphocytes Relative: 14 %
Lymphs Abs: 1 10*3/uL (ref 0.7–4.0)
MCH: 29.6 pg (ref 26.0–34.0)
MCHC: 31.9 g/dL (ref 30.0–36.0)
MCV: 92.7 fL (ref 80.0–100.0)
Monocytes Absolute: 0.4 10*3/uL (ref 0.1–1.0)
Monocytes Relative: 5 %
Neutro Abs: 5.3 10*3/uL (ref 1.7–7.7)
Neutrophils Relative %: 72 %
Platelets: 123 10*3/uL — ABNORMAL LOW (ref 150–400)
RBC: 3.72 MIL/uL — ABNORMAL LOW (ref 4.22–5.81)
RDW: 13.3 % (ref 11.5–15.5)
WBC: 7.4 10*3/uL (ref 4.0–10.5)
nRBC: 0 % (ref 0.0–0.2)

## 2019-11-29 LAB — BASIC METABOLIC PANEL
Anion gap: 10 (ref 5–15)
BUN: 25 mg/dL — ABNORMAL HIGH (ref 8–23)
CO2: 35 mmol/L — ABNORMAL HIGH (ref 22–32)
Calcium: 8.4 mg/dL — ABNORMAL LOW (ref 8.9–10.3)
Chloride: 90 mmol/L — ABNORMAL LOW (ref 98–111)
Creatinine, Ser: 0.52 mg/dL — ABNORMAL LOW (ref 0.61–1.24)
GFR calc Af Amer: >60 mL/min (ref >60)
GFR calc non Af Amer: >60 mL/min (ref >60)
Glucose, Bld: 176 mg/dL — ABNORMAL HIGH (ref 70–99)
Potassium: 3.5 mmol/L (ref 3.5–5.1)
Sodium: 135 mmol/L (ref 135–145)

## 2019-11-29 LAB — TRIGLYCERIDES: Triglycerides: 131 mg/dL (ref <150)

## 2019-11-29 LAB — GLUCOSE, CAPILLARY
Glucose-Capillary: 146 mg/dL — ABNORMAL HIGH (ref 70–99)
Glucose-Capillary: 109 mg/dL — ABNORMAL HIGH (ref 70–99)
Glucose-Capillary: 141 mg/dL — ABNORMAL HIGH (ref 70–99)
Glucose-Capillary: 183 mg/dL — ABNORMAL HIGH (ref 70–99)
Glucose-Capillary: 138 mg/dL — ABNORMAL HIGH (ref 70–99)
Glucose-Capillary: 144 mg/dL — ABNORMAL HIGH (ref 70–99)

## 2019-11-29 LAB — MAGNESIUM: Magnesium: 1.5 mg/dL — ABNORMAL LOW (ref 1.7–2.4)

## 2019-11-29 MED ORDER — METHYLNALTREXONE BROMIDE 12 MG/0.6ML ~~LOC~~ SOLN
12.0000 mg | Freq: Once | SUBCUTANEOUS | Status: AC
  Administered 2019-11-29: 12 mg via SUBCUTANEOUS
  Filled 2019-11-29: qty 0.6

## 2019-11-29 MED ORDER — MAGNESIUM SULFATE 2 GM/50ML IV SOLN
2.0000 g | Freq: Once | INTRAVENOUS | Status: AC
  Administered 2019-11-29: 2 g via INTRAVENOUS
  Filled 2019-11-29: qty 50

## 2019-11-29 MED ORDER — NOREPINEPHRINE 4 MG/250ML-% IV SOLN
0.0000 ug/min | INTRAVENOUS | Status: DC
  Administered 2019-11-29 – 2019-11-30 (×2): 2 ug/min via INTRAVENOUS
  Administered 2019-12-01: 4 ug/min via INTRAVENOUS
  Administered 2019-12-02: 2 ug/min via INTRAVENOUS
  Filled 2019-11-29 (×4): qty 250

## 2019-11-29 MED ORDER — FENTANYL 2500MCG IN NS 250ML (10MCG/ML) PREMIX INFUSION
0.0000 ug/h | INTRAVENOUS | Status: DC
  Administered 2019-11-29: 50 ug/h via INTRAVENOUS
  Administered 2019-11-30 (×2): 150 ug/h via INTRAVENOUS
  Administered 2019-12-02 – 2019-12-04 (×2): 50 ug/h via INTRAVENOUS
  Administered 2019-12-06: 100 ug/h via INTRAVENOUS
  Filled 2019-11-29 (×6): qty 250

## 2019-11-29 MED ORDER — GUAIFENESIN 100 MG/5ML PO SOLN: 5.0000 mL | ORAL | Status: AC | PRN

## 2019-11-29 MED ORDER — INSULIN GLARGINE 100 UNIT/ML ~~LOC~~ SOLN
10.0000 [IU] | Freq: Two times a day (BID) | SUBCUTANEOUS | Status: DC
  Administered 2019-11-29 – 2019-12-10 (×23): 10 [IU] via SUBCUTANEOUS
  Filled 2019-11-29 (×27): qty 0.1

## 2019-11-29 NOTE — Progress Notes (Signed)
PT Cancellation Note  Patient Details Name: [name] MRN: [id number] DOB: [birth year]   Cancelled Treatment:    Reason Eval/Treat Not Completed: Patient not medically ready.  Pt with status decline. 11/29/2019  Jacinto Halim., PT Acute Rehabilitation Services [phone]  (pager) [phone]  (office)   Eliseo Gum Vessie Olmsted 11/29/2019, 4:54 PM

## 2019-11-29 NOTE — Progress Notes (Signed)
eLink Physician-Brief Progress Note Patient Name: [name] DOB: [birth year] MRN: [id number]   Date of Service  11/29/2019  HPI/Events of Note  Pt needs order for a.m. labs  eICU Interventions   a.m. labs ordered.        Thomasene Lot Robyne Matar 11/29/2019, 4:38 AM

## 2019-11-29 NOTE — Progress Notes (Signed)
Pt having low spo2 with labored breathing. Pt taken off ventilator and 3cc Saline flushed down trach and pt bag lavaged to loosen mucous plugs. Pt coughed up moderate amount tan/pink mucous plugs. Pt placed back on vent and suctioned with small amount of thick tan/pink secretions removed. Pt stable throughout. VS improving with increased FiO2 and Peep. MD aware. RN at bedside
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
## 2019-11-29 NOTE — Progress Notes (Signed)
Pt's BP soft with systolic in 80's. Will start low dose norepi at this time with recent arf to cont with good renal perfusion.   Suspect this is 2/2 fentanyl gtt addition.

## 2019-11-30 ENCOUNTER — Inpatient Hospital Stay (HOSPITAL_COMMUNITY): Payer: Medicare Other

## 2019-11-30 LAB — GLUCOSE, CAPILLARY
Glucose-Capillary: 112 mg/dL — ABNORMAL HIGH (ref 70–99)
Glucose-Capillary: 139 mg/dL — ABNORMAL HIGH (ref 70–99)
Glucose-Capillary: 140 mg/dL — ABNORMAL HIGH (ref 70–99)
Glucose-Capillary: 153 mg/dL — ABNORMAL HIGH (ref 70–99)
Glucose-Capillary: 154 mg/dL — ABNORMAL HIGH (ref 70–99)
Glucose-Capillary: 158 mg/dL — ABNORMAL HIGH (ref 70–99)
Glucose-Capillary: 165 mg/dL — ABNORMAL HIGH (ref 70–99)

## 2019-11-30 LAB — CBC WITH DIFFERENTIAL/PLATELET
Abs Immature Granulocytes: 0.32 10*3/uL — ABNORMAL HIGH (ref 0.00–0.07)
Basophils Absolute: 0 10*3/uL (ref 0.0–0.1)
Basophils Relative: 0 %
Eosinophils Absolute: 0.5 10*3/uL (ref 0.0–0.5)
Eosinophils Relative: 6 %
HCT: 33.4 % — ABNORMAL LOW (ref 39.0–52.0)
Hemoglobin: 10.7 g/dL — ABNORMAL LOW (ref 13.0–17.0)
Immature Granulocytes: 4 %
Lymphocytes Relative: 13 %
Lymphs Abs: 1.2 10*3/uL (ref 0.7–4.0)
MCH: 30.1 pg (ref 26.0–34.0)
MCHC: 32 g/dL (ref 30.0–36.0)
MCV: 93.8 fL (ref 80.0–100.0)
Monocytes Absolute: 0.3 10*3/uL (ref 0.1–1.0)
Monocytes Relative: 3 %
Neutro Abs: 6.9 10*3/uL (ref 1.7–7.7)
Neutrophils Relative %: 74 %
Platelets: 131 10*3/uL — ABNORMAL LOW (ref 150–400)
RBC: 3.56 MIL/uL — ABNORMAL LOW (ref 4.22–5.81)
RDW: 13.4 % (ref 11.5–15.5)
WBC: 9.3 10*3/uL (ref 4.0–10.5)
nRBC: 0 % (ref 0.0–0.2)

## 2019-11-30 LAB — BASIC METABOLIC PANEL
Anion gap: 7 (ref 5–15)
BUN: 25 mg/dL — ABNORMAL HIGH (ref 8–23)
CO2: 40 mmol/L — ABNORMAL HIGH (ref 22–32)
Calcium: 8 mg/dL — ABNORMAL LOW (ref 8.9–10.3)
Chloride: 88 mmol/L — ABNORMAL LOW (ref 98–111)
Creatinine, Ser: 0.3 mg/dL — ABNORMAL LOW (ref 0.61–1.24)
GFR calc Af Amer: >60 mL/min (ref >60)
GFR calc non Af Amer: >60 mL/min (ref >60)
Glucose, Bld: 133 mg/dL — ABNORMAL HIGH (ref 70–99)
Potassium: 3.4 mmol/L — ABNORMAL LOW (ref 3.5–5.1)
Sodium: 135 mmol/L (ref 135–145)

## 2019-11-30 MED ORDER — SODIUM CHLORIDE 0.9% FLUSH: 10.0000 mL | INTRAVENOUS | Status: DC | PRN

## 2019-11-30 MED ORDER — POTASSIUM CHLORIDE 20 MEQ/15ML (10%) PO SOLN
20.0000 meq | ORAL | Status: AC
  Administered 2019-11-30 (×2): 20 meq
  Filled 2019-11-30 (×2): qty 15

## 2019-11-30 MED ORDER — SODIUM CHLORIDE 0.9 % IV SOLN
INTRAVENOUS | Status: DC | PRN
  Administered 2019-11-30: 500 mL via INTRAVENOUS
  Administered 2019-12-07 – 2019-12-26 (×7): 250 mL via INTRAVENOUS
  Administered 2019-12-28: 500 mL via INTRAVENOUS

## 2019-11-30 MED ORDER — SODIUM CHLORIDE 0.9% FLUSH
10.0000 mL | Freq: Two times a day (BID) | INTRAVENOUS | Status: DC
  Administered 2019-11-30: 20 mL
  Administered 2019-12-01 – 2020-01-01 (×57): 10 mL

## 2019-11-30 MED ORDER — SODIUM CHLORIDE 3 % IN NEBU
4.0000 mL | INHALATION_SOLUTION | Freq: Three times a day (TID) | RESPIRATORY_TRACT | Status: AC
  Administered 2019-11-30 – 2019-12-03 (×9): 4 mL via RESPIRATORY_TRACT
  Filled 2019-11-30 (×9): qty 4

## 2019-11-30 NOTE — Progress Notes (Signed)
eLink Physician-Brief Progress Note Patient Name: [name] DOB: July 26, 1956 MRN: [id number]   Date of Service  11/30/2019  HPI/Events of Note  Pt with recurrent acute urinary retention  eICU Interventions  Foley catheter ordered.        [name] 11/30/2019, 3:21 AM
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
## 2019-11-30 NOTE — Progress Notes (Signed)
Elink made aware of pt retaining urine. Bladder scanned pt and he is retaining 504 ml's of urine. Awaiting new orders. Continuing to monitor

## 2019-11-30 NOTE — Progress Notes (Signed)
[facility] ADULT ICU REPLACEMENT PROTOCOL FOR AM LAB REPLACEMENT ONLY  The patient does apply for the [facility] Adult ICU Electrolyte Replacment Protocol based on the criteria listed below:   1. Is GFR >/= 40 ml/min? Yes.    Patient's GFR today is >60 2. Is urine output >/= 0.5 ml/kg/hr for the last 6 hours? Yes.   Patient's UOP is 1.6 ml/kg/hr 3. Is BUN < 60 mg/dL? Yes.    Patient's BUN today is 25 4. Abnormal electrolyte(s): K-3.4 5. Ordered repletion with: peprotocol 6. If a panic level lab has been reported, has the CCM MD in charge been notified? Yes.  .   Physician:  Dr Heywood Bene, Dixon Boos 11/30/2019 6:26 AM

## 2019-11-30 NOTE — Progress Notes (Signed)
Peripherally Inserted Central Catheter Placement  The IV Nurse has discussed with the patient and/or persons authorized to consent for the patient, the purpose of this procedure and the potential benefits and risks involved with this procedure.  The benefits include less needle sticks, lab draws from the catheter, and the patient Almgren be discharged home with the catheter. Risks include, but not limited to, infection, bleeding, blood clot (thrombus formation), and puncture of an artery; nerve damage and irregular heartbeat and possibility to perform a PICC exchange if needed/ordered by physician.  Alternatives to this procedure were also discussed.  Bard Power PICC patient education guide, fact sheet on infection prevention and patient information card has been provided to patient /or left at bedside.    PICC Placement Documentation  PICC Double Lumen 11/30/19 PICC Right Brachial 38 cm 0 cm (Active)  Indication for Insertion or Continuance of Line Vasoactive infusions;Administration of hyperosmolar/irritating solutions (i.e. TPN, Vancomycin, etc.);Prolonged intravenous therapies;Limited venous access - need for IV therapy >5 days (PICC only);Poor Vasculature-patient has had multiple peripheral attempts or PIVs lasting less than 24 hours 11/30/19 1209  Exposed Catheter (cm) 0 cm 11/30/19 1209  Site Assessment Clean;Dry;Intact 11/30/19 1209  Lumen #1 Status Flushed;Saline locked;Blood return noted 11/30/19 1209  Lumen #2 Status Flushed;Saline locked;Blood return noted 11/30/19 1209  Dressing Type Transparent 11/30/19 1209  Dressing Status Clean;Dry;Intact;Antimicrobial disc in place 11/30/19 1209  Safety Lock Not Applicable 11/30/19 1209  Line Care Connections checked and tightened 11/30/19 1209  Line Adjustment (NICU/IV Team Only) No 11/30/19 1209  Dressing Intervention New dressing 11/30/19 1209  Dressing Change Due 12/07/19 11/30/19 1209       Elliot Dally 11/30/2019, 12:11 PM

## 2019-11-30 NOTE — Progress Notes (Signed)
Telephone consent obtained from wife, Gillis Ends, for PICC placement.

## 2019-11-30 NOTE — Progress Notes (Addendum)
Spoke with Dow Adolph RN re modifying order from triple lumen to double lumen- all meds compatible except Magnesium if needed again per Micromedex.

## 2019-11-30 NOTE — Progress Notes (Signed)
73f foley cath placed under sterile precautions. Irine Seal., RN assisted this nurse with placement. 650 cc's of clear, yellow, urine obtained. Pt tolerated well.

## 2019-12-01 DIAGNOSIS — Z930 Tracheostomy status: Secondary | ICD-10-CM

## 2019-12-01 LAB — BASIC METABOLIC PANEL
Anion gap: 7 (ref 5–15)
BUN: 21 mg/dL (ref 8–23)
CO2: 40 mmol/L — ABNORMAL HIGH (ref 22–32)
Calcium: 7.9 mg/dL — ABNORMAL LOW (ref 8.9–10.3)
Chloride: 89 mmol/L — ABNORMAL LOW (ref 98–111)
Creatinine, Ser: 0.38 mg/dL — ABNORMAL LOW (ref 0.61–1.24)
GFR calc Af Amer: >60 mL/min (ref >60)
GFR calc non Af Amer: >60 mL/min (ref >60)
Glucose, Bld: 146 mg/dL — ABNORMAL HIGH (ref 70–99)
Potassium: 3.7 mmol/L (ref 3.5–5.1)
Sodium: 136 mmol/L (ref 135–145)

## 2019-12-01 LAB — GLUCOSE, CAPILLARY
Glucose-Capillary: 138 mg/dL — ABNORMAL HIGH (ref 70–99)
Glucose-Capillary: 145 mg/dL — ABNORMAL HIGH (ref 70–99)
Glucose-Capillary: 179 mg/dL — ABNORMAL HIGH (ref 70–99)
Glucose-Capillary: 140 mg/dL — ABNORMAL HIGH (ref 70–99)
Glucose-Capillary: 200 mg/dL — ABNORMAL HIGH (ref 70–99)

## 2019-12-01 LAB — CBC
HCT: 31 % — ABNORMAL LOW (ref 39.0–52.0)
Hemoglobin: 9.8 g/dL — ABNORMAL LOW (ref 13.0–17.0)
MCH: 29.8 pg (ref 26.0–34.0)
MCHC: 31.6 g/dL (ref 30.0–36.0)
MCV: 94.2 fL (ref 80.0–100.0)
Platelets: 123 10*3/uL — ABNORMAL LOW (ref 150–400)
RBC: 3.29 MIL/uL — ABNORMAL LOW (ref 4.22–5.81)
RDW: 13.6 % (ref 11.5–15.5)
WBC: 9 10*3/uL (ref 4.0–10.5)
nRBC: 0 % (ref 0.0–0.2)

## 2019-12-01 MED ORDER — SENNOSIDES-DOCUSATE SODIUM 8.6-50 MG PO TABS: 1.0000 | ORAL_TABLET | Freq: Every evening | ORAL | Status: DC | PRN

## 2019-12-01 MED ORDER — POLYETHYLENE GLYCOL 3350 17 G PO PACK: 17.0000 g | PACK | Freq: Every day | ORAL | Status: DC | PRN

## 2019-12-01 MED ORDER — FUROSEMIDE 10 MG/ML IJ SOLN
40.0000 mg | Freq: Once | INTRAMUSCULAR | Status: AC
  Administered 2019-12-01: 40 mg via INTRAVENOUS
  Filled 2019-12-01: qty 4

## 2019-12-01 NOTE — Progress Notes (Addendum)
After turning patient and doing mouth care, pt became tachypneic with respirations in the 40's, HR 110-120's, and O2 sat 88%. RT called in the room and increased FiO2 to 100%. Patient given of Fentanyl bolus and precedex gtt increased to 1.26mcg with no relief. Fentanyl gtt restarted and pt now resting in bed. Will continue to monitor patient and try to wean off fentanyl gtt.
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
## 2019-12-01 NOTE — Progress Notes (Signed)
eLink Physician-Brief Progress Note Patient Name: [name] DOB: 08-May-1956 MRN: [id number]   Date of Service  12/01/2019  HPI/Events of Note  Ca 7.9. Albumin is low in this patient (2.2 on 4/17, and unlikely to be much higher now with prolonged hospital stay). Based on that albumin his corrected calcium would be 9.7.   eICU Interventions  No action required at this time.     Intervention Category Minor Interventions: Electrolytes abnormality - evaluation and management  Janae Bridgeman 12/01/2019, 5:51 AM

## 2019-12-02 LAB — CBC WITH DIFFERENTIAL/PLATELET
Abs Immature Granulocytes: 0.34 10*3/uL — ABNORMAL HIGH (ref 0.00–0.07)
Basophils Absolute: 0 10*3/uL (ref 0.0–0.1)
Basophils Relative: 0 %
Eosinophils Absolute: 0.6 10*3/uL — ABNORMAL HIGH (ref 0.0–0.5)
Eosinophils Relative: 7 %
HCT: 30.3 % — ABNORMAL LOW (ref 39.0–52.0)
Hemoglobin: 9.6 g/dL — ABNORMAL LOW (ref 13.0–17.0)
Immature Granulocytes: 4 %
Lymphocytes Relative: 12 %
Lymphs Abs: 1 10*3/uL (ref 0.7–4.0)
MCH: 29.5 pg (ref 26.0–34.0)
MCHC: 31.7 g/dL (ref 30.0–36.0)
MCV: 93.2 fL (ref 80.0–100.0)
Monocytes Absolute: 0.2 10*3/uL (ref 0.1–1.0)
Monocytes Relative: 3 %
Neutro Abs: 6.4 10*3/uL (ref 1.7–7.7)
Neutrophils Relative %: 74 %
Platelets: 133 10*3/uL — ABNORMAL LOW (ref 150–400)
RBC: 3.25 MIL/uL — ABNORMAL LOW (ref 4.22–5.81)
RDW: 13.8 % (ref 11.5–15.5)
WBC: 8.5 10*3/uL (ref 4.0–10.5)
nRBC: 0 % (ref 0.0–0.2)

## 2019-12-02 LAB — BASIC METABOLIC PANEL
Anion gap: 8 (ref 5–15)
BUN: 19 mg/dL (ref 8–23)
CO2: 39 mmol/L — ABNORMAL HIGH (ref 22–32)
Calcium: 7.8 mg/dL — ABNORMAL LOW (ref 8.9–10.3)
Chloride: 90 mmol/L — ABNORMAL LOW (ref 98–111)
Creatinine, Ser: 0.47 mg/dL — ABNORMAL LOW (ref 0.61–1.24)
GFR calc Af Amer: >60 mL/min (ref >60)
GFR calc non Af Amer: >60 mL/min (ref >60)
Glucose, Bld: 180 mg/dL — ABNORMAL HIGH (ref 70–99)
Potassium: 3.7 mmol/L (ref 3.5–5.1)
Sodium: 137 mmol/L (ref 135–145)

## 2019-12-02 LAB — GLUCOSE, CAPILLARY
Glucose-Capillary: 147 mg/dL — ABNORMAL HIGH (ref 70–99)
Glucose-Capillary: 157 mg/dL — ABNORMAL HIGH (ref 70–99)
Glucose-Capillary: 165 mg/dL — ABNORMAL HIGH (ref 70–99)
Glucose-Capillary: 167 mg/dL — ABNORMAL HIGH (ref 70–99)
Glucose-Capillary: 183 mg/dL — ABNORMAL HIGH (ref 70–99)
Glucose-Capillary: 190 mg/dL — ABNORMAL HIGH (ref 70–99)
Glucose-Capillary: 207 mg/dL — ABNORMAL HIGH (ref 70–99)

## 2019-12-02 LAB — MAGNESIUM: Magnesium: 1.8 mg/dL (ref 1.7–2.4)

## 2019-12-02 LAB — CALCIUM, IONIZED: Calcium, Ionized, Serum: 4.6 mg/dL (ref 4.5–5.6)

## 2019-12-02 MED ORDER — POLYETHYLENE GLYCOL 3350 17 G PO PACK: 17.0000 g | PACK | Freq: Every day | ORAL | Status: DC | PRN

## 2019-12-02 MED ORDER — MIDODRINE HCL 5 MG PO TABS
10.0000 mg | ORAL_TABLET | Freq: Three times a day (TID) | ORAL | Status: DC
  Administered 2019-12-02: 10 mg via ORAL
  Filled 2019-12-02: qty 2

## 2019-12-02 MED ORDER — MONTELUKAST SODIUM 10 MG PO TABS
10.0000 mg | ORAL_TABLET | Freq: Every day | ORAL | Status: DC
  Administered 2019-12-02 – 2019-12-11 (×10): 10 mg
  Filled 2019-12-02 (×10): qty 1

## 2019-12-02 MED ORDER — QUETIAPINE FUMARATE 50 MG PO TABS
50.0000 mg | ORAL_TABLET | Freq: Two times a day (BID) | ORAL | Status: DC
  Administered 2019-12-02 – 2019-12-05 (×7): 50 mg
  Filled 2019-12-02 (×6): qty 1

## 2019-12-02 MED ORDER — TAMSULOSIN HCL 0.4 MG PO CAPS
0.4000 mg | ORAL_CAPSULE | Freq: Every day | ORAL | Status: DC
  Administered 2019-12-02: 0.4 mg via ORAL
  Filled 2019-12-02: qty 1

## 2019-12-02 MED ORDER — CLONAZEPAM 1 MG PO TABS
2.0000 mg | ORAL_TABLET | Freq: Three times a day (TID) | ORAL | Status: DC
  Administered 2019-12-02 – 2019-12-15 (×40): 2 mg
  Filled 2019-12-02 (×39): qty 2

## 2019-12-02 MED ORDER — SENNOSIDES-DOCUSATE SODIUM 8.6-50 MG PO TABS
1.0000 | ORAL_TABLET | Freq: Every evening | ORAL | Status: DC | PRN
  Administered 2019-12-04 – 2019-12-05 (×2): 1
  Filled 2019-12-02 (×3): qty 1

## 2019-12-02 MED ORDER — CLONAZEPAM 1 MG PO TABS
2.0000 mg | ORAL_TABLET | Freq: Three times a day (TID) | ORAL | Status: DC
  Filled 2019-12-02: qty 2

## 2019-12-02 MED ORDER — OXYCODONE HCL 5 MG PO TABS
10.0000 mg | ORAL_TABLET | Freq: Four times a day (QID) | ORAL | Status: DC
  Administered 2019-12-02 – 2019-12-15 (×52): 10 mg
  Filled 2019-12-02 (×53): qty 2

## 2019-12-02 MED ORDER — MIDODRINE HCL 5 MG PO TABS
10.0000 mg | ORAL_TABLET | Freq: Three times a day (TID) | ORAL | Status: DC
  Administered 2019-12-02 – 2019-12-24 (×65): 10 mg
  Filled 2019-12-02 (×66): qty 2

## 2019-12-02 NOTE — Progress Notes (Signed)
Trach sutures removed from trach site.  Upon removing sutures RT noted that right side sutures had an extra suture not attach to patient's trach flange.  When sutures were removed from skin, RT noted that there was a red sore where suture was.  RN made aware.  Will continue to monitor site.

## 2019-12-02 NOTE — Evaluation (Signed)
Physical Therapy Evaluation Patient Details Name: [name] MRN: [id number] DOB: September 27, 1955 Today's Date: 12/02/2019   History of Present Illness  pt is a 64 y/o male with pmhx significant for CAD, DM, CABG, ACDF admitted 4/2 with COVID PNA and delirium, intubated 4/5, proned and paralyzed 4/5-4/9, sedated until 4/12, Bronchoscopy for tube change 4/16, 4/19 trached.  Remains sedated.  Clinical Impression  Pt admitted with/for Covid PNA and has been on the vent and sedated in a declined status.  Pt mobilized dependently with total assist of 2 person.  Pt currently limited functionally due to the problems listed. ( See problems list.)   Pt will benefit from PT to maximize function and safety in order to get ready for next venue listed below.     Follow Up Recommendations LTACH;Other (comment);SNF(working toward least restrictive environment)    Equipment Recommendations  Other (comment)(TBA)    Recommendations for Other Services       Precautions / Restrictions        Mobility  Bed Mobility Overal bed mobility: Needs Assistance Bed Mobility: Supine to Sit;Sit to Supine     Supine to sit: Total assist;+2 for physical assistance Sit to supine: Total assist;+2 for physical assistance   General bed mobility comments: pt did not arouse enough to move.  Dependent for all movement on sedation.  Transfers                 General transfer comment: NT  Ambulation/Gait             General Gait Details: NT  Stairs            Wheelchair Mobility    Modified Rankin (Stroke Patients Only)       Balance Overall balance assessment: Needs assistance   Sitting balance-Leahy Scale: Zero                                       Pertinent Vitals/Pain Pain Assessment: Faces Faces Pain Scale: No hurt Pain Intervention(s): Monitored during session    Home Living Family/patient expects to be discharged to:: Private residence Living Arrangements:  Spouse/significant other;Children;Other (Comment)(1 son still lives with them) Available Help at Discharge: Family;Available PRN/intermittently;Available 24 hours/day(wife and son work but she can take FMLA) Type of Home: Mobile home Home Access: Stairs to enter Entrance Stairs-Rails: Right;Left   Home Layout: One level Home Equipment: None      Prior Function Level of Independence: Independent               Hand Dominance   Dominant Hand: Right    Extremity/Trunk Assessment        Lower Extremity Assessment Lower Extremity Assessment: (pt not able to move spontaneously on sedation)       Communication   Communication: No difficulties  Cognition Arousal/Alertness: (sedated) Behavior During Therapy: Flat affect Overall Cognitive Status: Impaired/Different from baseline                                        General Comments General comments (skin integrity, edema, etc.): vss, SpO2 up to as much as 100% in sitting, per RN generally higher overall in sitting EOB with vent support    Exercises Other Exercises Other Exercises: general PROM to bil LE   Assessment/Plan    PT Assessment  Patient needs continued PT services  PT Problem List Decreased strength;Decreased activity tolerance;Decreased balance;Decreased mobility;Decreased coordination;Cardiopulmonary status limiting activity       PT Treatment Interventions Functional mobility training;Therapeutic activities;Balance training;Patient/family education    PT Goals (Current goals can be found in the Care Plan section)  Acute Rehab PT Goals Patient Stated Goal: pt unable PT Goal Formulation: Patient unable to participate in goal setting Time For Goal Achievement: 12/16/19 Potential to Achieve Goals: Fair    Frequency Min 2X/week   Barriers to discharge        Co-evaluation               AM-PAC PT "6 Clicks" Mobility  Outcome Measure Help needed turning from your back to your  side while in a flat bed without using bedrails?: Total Help needed moving from lying on your back to sitting on the side of a flat bed without using bedrails?: Total Help needed moving to and from a bed to a chair (including a wheelchair)?: Total Help needed standing up from a chair using your arms (e.g., wheelchair or bedside chair)?: Total Help needed to walk in hospital room?: Total Help needed climbing 3-5 steps with a railing? : Total 6 Click Score: 6    End of Session Equipment Utilized During Treatment: Oxygen Activity Tolerance: Patient tolerated treatment well Patient left: in bed;with call bell/phone within reach;with nursing/sitter in room;with family/visitor present;with SCD's reapplied Nurse Communication: Mobility status PT Visit Diagnosis: Muscle weakness (generalized) (M62.81);Other symptoms and signs involving the nervous system (R29.898);Adult, failure to thrive (R62.7)    Time: 1411-1430 PT Time Calculation (min) (ACUTE ONLY): 19 min   Charges:   PT Evaluation $PT Eval High Complexity: 1 High          12/02/2019  Ginger Carne., PT Acute Rehabilitation Services [phone]  (pager) [phone]  (office)  Tessie Fass Jourdain Guay 12/02/2019, 3:48 PM
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
## 2019-12-02 NOTE — Progress Notes (Signed)
Patient ID: [name] Knee, male   DOB: 03/01/1956, 64 y.o.   MRN: [id number]    Order for G tube has been received in IR  We will get CT scan Abd to evaluate anatomy for same  There is a Sport and exercise psychologist of G tubes now IR cannot promise time frame of placement. We are awaiting a shipment  We will let MD know when we can move forward

## 2019-12-03 ENCOUNTER — Inpatient Hospital Stay (HOSPITAL_COMMUNITY): Payer: Medicare Other

## 2019-12-03 DIAGNOSIS — R0603 Acute respiratory distress: Secondary | ICD-10-CM | POA: Diagnosis not present

## 2019-12-03 LAB — CBC WITH DIFFERENTIAL/PLATELET
Abs Immature Granulocytes: 0.22 10*3/uL — ABNORMAL HIGH (ref 0.00–0.07)
Basophils Absolute: 0 10*3/uL (ref 0.0–0.1)
Basophils Relative: 0 %
Eosinophils Absolute: 0.8 10*3/uL — ABNORMAL HIGH (ref 0.0–0.5)
Eosinophils Relative: 9 %
HCT: 31 % — ABNORMAL LOW (ref 39.0–52.0)
Hemoglobin: 9.9 g/dL — ABNORMAL LOW (ref 13.0–17.0)
Immature Granulocytes: 3 %
Lymphocytes Relative: 14 %
Lymphs Abs: 1.2 10*3/uL (ref 0.7–4.0)
MCH: 29.8 pg (ref 26.0–34.0)
MCHC: 31.9 g/dL (ref 30.0–36.0)
MCV: 93.4 fL (ref 80.0–100.0)
Monocytes Absolute: 0.3 10*3/uL (ref 0.1–1.0)
Monocytes Relative: 3 %
Neutro Abs: 6.1 10*3/uL (ref 1.7–7.7)
Neutrophils Relative %: 71 %
Platelets: 164 10*3/uL (ref 150–400)
RBC: 3.32 MIL/uL — ABNORMAL LOW (ref 4.22–5.81)
RDW: 13.8 % (ref 11.5–15.5)
WBC: 8.6 10*3/uL (ref 4.0–10.5)
nRBC: 0 % (ref 0.0–0.2)

## 2019-12-03 LAB — BASIC METABOLIC PANEL
Anion gap: 7 (ref 5–15)
BUN: 17 mg/dL (ref 8–23)
CO2: 38 mmol/L — ABNORMAL HIGH (ref 22–32)
Calcium: 7.2 mg/dL — ABNORMAL LOW (ref 8.9–10.3)
Chloride: 92 mmol/L — ABNORMAL LOW (ref 98–111)
Creatinine, Ser: 0.45 mg/dL — ABNORMAL LOW (ref 0.61–1.24)
GFR calc Af Amer: >60 mL/min (ref >60)
GFR calc non Af Amer: >60 mL/min (ref >60)
Glucose, Bld: 112 mg/dL — ABNORMAL HIGH (ref 70–99)
Potassium: 4.4 mmol/L (ref 3.5–5.1)
Sodium: 137 mmol/L (ref 135–145)

## 2019-12-03 LAB — GLUCOSE, CAPILLARY
Glucose-Capillary: 110 mg/dL — ABNORMAL HIGH (ref 70–99)
Glucose-Capillary: 116 mg/dL — ABNORMAL HIGH (ref 70–99)
Glucose-Capillary: 164 mg/dL — ABNORMAL HIGH (ref 70–99)
Glucose-Capillary: 166 mg/dL — ABNORMAL HIGH (ref 70–99)
Glucose-Capillary: 211 mg/dL — ABNORMAL HIGH (ref 70–99)

## 2019-12-03 LAB — MAGNESIUM: Magnesium: 1.8 mg/dL (ref 1.7–2.4)

## 2019-12-03 MED ORDER — NOREPINEPHRINE 4 MG/250ML-% IV SOLN
0.0000 ug/min | INTRAVENOUS | Status: DC
  Administered 2019-12-03: 2 ug/min via INTRAVENOUS
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
## 2019-12-03 NOTE — Progress Notes (Signed)
  Speech Language Pathology Patient Details Name: [name] MRN: [id number] DOB: 12-12-1955 Today's Date: 12/03/2019 Time:  -     Reviewed pt's chart and spoke with pt's RN. He continues with sedation and full vent support and has been unable to wean sedation or respiratory requirements. Will plan to follow up first of next week for need to request PMV orders.                 GO                [name] 12/03/2019, 4:55 PM   [name].Ed Nurse, children's [phone] Office [phone]

## 2019-12-03 NOTE — Progress Notes (Signed)
Patient transported CT and back to room 2M15 without complications.

## 2019-12-03 NOTE — TOC Progression Note (Signed)
Transition of Care [facility]) - Progression Note    Patient Details  Name: [name] MRN: [id number] Date of Birth: [birth year]  Transition of Care [facility]) CM/SW Contact  Kristilyn Coltrane, Manfred Arch, RN Phone Number: 12/03/2019, 4:19 PM  Clinical Narrative:    Pt transferred from 19M Pt currently trached - still requiring vent with documented copious secretions.  Pt also with NG tube.  CM text paged attending to discuss [facility] referral consideration.  [facility] probe initiated by 19M CM.    As of today 12/03/19; [facility] will to offer pt a bed pending insurance auth.  [facility] will need origin of fever determined or pt will need to be afebrile for 3 days prior to seeking auth.  CM will initiate LTACH referral once concurrence is given by attending group.            Expected Discharge Plan and Services                                                 Social Determinants of Health (SDOH) Interventions    Readmission Risk Interventions No flowsheet data found.

## 2019-12-03 NOTE — Progress Notes (Signed)
eLink Physician-Brief Progress Note Patient Name: [name] DOB: [birth year] MRN: [id number]   Date of Service  12/03/2019  HPI/Events of Note  Pt is having frequent watery stools.  eICU Interventions  Flexiseal ordered.        Migdalia Dk 12/03/2019, 8:38 PM

## 2019-12-03 NOTE — Progress Notes (Signed)
eLink Physician-Brief Progress Note Patient Name: [name] DOB: 1955-12-08 MRN: [id number]   Date of Service  12/03/2019  HPI/Events of Note  Pt needs a.m. labs.  eICU Interventions   a.m. labs ordered.     Intervention Category Minor Interventions: Clinical assessment - ordering diagnostic tests  Migdalia Dk 12/03/2019, 4:02 AM

## 2019-12-04 ENCOUNTER — Inpatient Hospital Stay (HOSPITAL_COMMUNITY): Payer: Medicare Other

## 2019-12-04 LAB — COMPREHENSIVE METABOLIC PANEL
ALT: 40 U/L (ref 0–44)
AST: 41 U/L (ref 15–41)
Albumin: 1.5 g/dL — ABNORMAL LOW (ref 3.5–5.0)
Alkaline Phosphatase: 70 U/L (ref 38–126)
Anion gap: 7 (ref 5–15)
BUN: 18 mg/dL (ref 8–23)
CO2: 36 mmol/L — ABNORMAL HIGH (ref 22–32)
Calcium: 8 mg/dL — ABNORMAL LOW (ref 8.9–10.3)
Chloride: 93 mmol/L — ABNORMAL LOW (ref 98–111)
Creatinine, Ser: 0.4 mg/dL — ABNORMAL LOW (ref 0.61–1.24)
GFR calc Af Amer: >60 mL/min (ref >60)
GFR calc non Af Amer: >60 mL/min (ref >60)
Glucose, Bld: 225 mg/dL — ABNORMAL HIGH (ref 70–99)
Potassium: 4.1 mmol/L (ref 3.5–5.1)
Sodium: 136 mmol/L (ref 135–145)
Total Bilirubin: 0.7 mg/dL (ref 0.3–1.2)
Total Protein: 5.4 g/dL — ABNORMAL LOW (ref 6.5–8.1)

## 2019-12-04 LAB — CBC WITH DIFFERENTIAL/PLATELET
Abs Immature Granulocytes: 0.13 10*3/uL — ABNORMAL HIGH (ref 0.00–0.07)
Basophils Absolute: 0 10*3/uL (ref 0.0–0.1)
Basophils Relative: 0 %
Eosinophils Absolute: 0.6 10*3/uL — ABNORMAL HIGH (ref 0.0–0.5)
Eosinophils Relative: 5 %
HCT: 31.5 % — ABNORMAL LOW (ref 39.0–52.0)
Hemoglobin: 10.1 g/dL — ABNORMAL LOW (ref 13.0–17.0)
Immature Granulocytes: 1 %
Lymphocytes Relative: 8 %
Lymphs Abs: 0.8 10*3/uL (ref 0.7–4.0)
MCH: 29.5 pg (ref 26.0–34.0)
MCHC: 32.1 g/dL (ref 30.0–36.0)
MCV: 92.1 fL (ref 80.0–100.0)
Monocytes Absolute: 0.4 10*3/uL (ref 0.1–1.0)
Monocytes Relative: 4 %
Neutro Abs: 8.8 10*3/uL — ABNORMAL HIGH (ref 1.7–7.7)
Neutrophils Relative %: 82 %
Platelets: 187 10*3/uL (ref 150–400)
RBC: 3.42 MIL/uL — ABNORMAL LOW (ref 4.22–5.81)
RDW: 14.3 % (ref 11.5–15.5)
WBC: 10.7 10*3/uL — ABNORMAL HIGH (ref 4.0–10.5)
nRBC: 0 % (ref 0.0–0.2)

## 2019-12-04 LAB — GLUCOSE, CAPILLARY
Glucose-Capillary: 193 mg/dL — ABNORMAL HIGH (ref 70–99)
Glucose-Capillary: 195 mg/dL — ABNORMAL HIGH (ref 70–99)
Glucose-Capillary: 198 mg/dL — ABNORMAL HIGH (ref 70–99)
Glucose-Capillary: 199 mg/dL — ABNORMAL HIGH (ref 70–99)
Glucose-Capillary: 222 mg/dL — ABNORMAL HIGH (ref 70–99)
Glucose-Capillary: 237 mg/dL — ABNORMAL HIGH (ref 70–99)
Glucose-Capillary: 245 mg/dL — ABNORMAL HIGH (ref 70–99)
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
## 2019-12-04 NOTE — Progress Notes (Addendum)
Inpatient Diabetes Program Recommendations  AACE/ADA: New Consensus Statement on Inpatient Glycemic Control (2015)  Target Ranges:  Prepandial:   less than 140 mg/dL      Peak postprandial:   less than 180 mg/dL (1-2 hours)      Critically ill patients:  140 - 180 mg/dL   Lab Results  Component Value Date   GLUCAP 222 (H) 12/04/2019   HGBA1C 7.5 (H) 11/20/2019    Review of Glycemic Control Results for [name], [name] (MRN [id number]) as of 12/04/2019 09:43  Ref. Range 12/03/2019 16:01 12/03/2019 20:23 12/04/2019 00:27 12/04/2019 04:30 12/04/2019 08:15  Glucose-Capillary Latest Ref Range: 70 - 99 mg/dL 961 (H) 164 (H) 353 (H) 198 (H) 222 (H)   Diabetes history: Dm 2 Outpatient Diabetes medications: Metformin 1000 mg bid Current orders for Inpatient glycemic control:  Lantus 10 units bid Novolog 0-15 units Q4 hours Tradjenta 5 mg Daily  Vital AF 65 ml/hour  Inpatient Diabetes Program Recommendations:    Add Novolog 4 units Q4 hours Tube Feed Coverage.   Thanks,  Christena Deem RN, MSN, BC-ADM Inpatient Diabetes Coordinator Team Pager [phone] (8a-5p)

## 2019-12-04 NOTE — Progress Notes (Signed)
Nutrition Follow-up  DOCUMENTATION CODES:   Not applicable  INTERVENTION:   Continue Tube Feeding:  Vital AF 1.2 at 65 ml/hr Pro-Stat 30 mL daily Provides 1972 kcals, 132 g of protein and 1264 mL of free water   NUTRITION DIAGNOSIS:   Inadequate oral intake related to inability to eat, acute illness as evidenced by NPO status.  Ongoing  GOAL:   Patient will meet greater than or equal to 90% of their needs  Met with TF  MONITOR:   Vent status, Labs, Weight trends, TF tolerance  REASON FOR ASSESSMENT:   Ventilator, Consult Enteral/tube feeding initiation and management  ASSESSMENT:   64 yo male with ARDS secondary to COVID-19 pnuemonia requiring intubation, acute encephalopathy with hx of heavy EtOH abuse, AKI. PMH includes DM, HTN, CAD s/p PCI/CABG  4/02 Admitted 4/05 Intubated, Paralyzed, Prone 4/06 TF initiated via OG tube 4/07 Cortrak, tip in the stomach 4/09 Proning and paralyzation stopped 4/19 Trach placed                                            Currently receiving TF via Cortrak: Vital AF 1.2 at 65 ml/h with Pro-stat 30 ml once daily.  Free water 100 ml every 4 hours.   Patient remains on ventilator support MV: 11.7 L/min Temp (24hrs), Avg:97.2 F (36.2 C), Min:80 F (26.7 C), Max:101.1 F (38.4 C)   Labs reviewed. CBGs [phone] Medications reviewed and include ss novolog, lantus, tradjenta, MVI with minerals, thiamine.  I/O -7.9 L since admission. Weight fluctuating.  Diet Order:   Diet Order            Diet NPO time specified  Diet effective now              EDUCATION NEEDS:   Not appropriate for education at this time  Skin:  Skin Assessment: Skin Integrity Issues: Skin Integrity Issues:: DTI DTI: coccyx  Last BM:  4/28 (rectal tube)  Height:   Ht Readings from Last 1 Encounters:  11/29/19 '5\' 8"'  (1.727 m)    Weight:   Wt Readings from Last 1 Encounters:  12/04/19 81.4 kg    BMI:  Body mass index is 27.29  kg/m.  Estimated Nutritional Needs:   Kcal:  1950-2270 kcals  Protein:  120-145 g  Fluid:  >/= 2 L   Lucas Mallow, RD, LDN, CNSC Please refer to Amion for contact information.

## 2019-12-05 LAB — CBC WITH DIFFERENTIAL/PLATELET
Abs Immature Granulocytes: 0.14 10*3/uL — ABNORMAL HIGH (ref 0.00–0.07)
Basophils Absolute: 0 10*3/uL (ref 0.0–0.1)
Basophils Relative: 0 %
Eosinophils Absolute: 0.8 10*3/uL — ABNORMAL HIGH (ref 0.0–0.5)
Eosinophils Relative: 8 %
HCT: 31.3 % — ABNORMAL LOW (ref 39.0–52.0)
Hemoglobin: 9.7 g/dL — ABNORMAL LOW (ref 13.0–17.0)
Immature Granulocytes: 1 %
Lymphocytes Relative: 9 %
Lymphs Abs: 1 10*3/uL (ref 0.7–4.0)
MCH: 28.9 pg (ref 26.0–34.0)
MCHC: 31 g/dL (ref 30.0–36.0)
MCV: 93.2 fL (ref 80.0–100.0)
Monocytes Absolute: 0.4 10*3/uL (ref 0.1–1.0)
Monocytes Relative: 3 %
Neutro Abs: 8.5 10*3/uL — ABNORMAL HIGH (ref 1.7–7.7)
Neutrophils Relative %: 79 %
Platelets: 210 10*3/uL (ref 150–400)
RBC: 3.36 MIL/uL — ABNORMAL LOW (ref 4.22–5.81)
RDW: 14.3 % (ref 11.5–15.5)
WBC: 10.8 10*3/uL — ABNORMAL HIGH (ref 4.0–10.5)
nRBC: 0 % (ref 0.0–0.2)

## 2019-12-05 LAB — BASIC METABOLIC PANEL
Anion gap: 7 (ref 5–15)
BUN: 21 mg/dL (ref 8–23)
CO2: 36 mmol/L — ABNORMAL HIGH (ref 22–32)
Calcium: 8 mg/dL — ABNORMAL LOW (ref 8.9–10.3)
Chloride: 95 mmol/L — ABNORMAL LOW (ref 98–111)
Creatinine, Ser: 0.43 mg/dL — ABNORMAL LOW (ref 0.61–1.24)
GFR calc Af Amer: >60 mL/min (ref >60)
GFR calc non Af Amer: >60 mL/min (ref >60)
Glucose, Bld: 233 mg/dL — ABNORMAL HIGH (ref 70–99)
Potassium: 4 mmol/L (ref 3.5–5.1)
Sodium: 138 mmol/L (ref 135–145)

## 2019-12-05 LAB — GLUCOSE, CAPILLARY
Glucose-Capillary: 191 mg/dL — ABNORMAL HIGH (ref 70–99)
Glucose-Capillary: 199 mg/dL — ABNORMAL HIGH (ref 70–99)
Glucose-Capillary: 201 mg/dL — ABNORMAL HIGH (ref 70–99)
Glucose-Capillary: 209 mg/dL — ABNORMAL HIGH (ref 70–99)
Glucose-Capillary: 212 mg/dL — ABNORMAL HIGH (ref 70–99)
Glucose-Capillary: 247 mg/dL — ABNORMAL HIGH (ref 70–99)

## 2019-12-05 LAB — MAGNESIUM: Magnesium: 2 mg/dL (ref 1.7–2.4)

## 2019-12-05 LAB — PHOSPHORUS: Phosphorus: 2.9 mg/dL (ref 2.5–4.6)

## 2019-12-05 MED ORDER — INSULIN ASPART 100 UNIT/ML ~~LOC~~ SOLN
4.0000 [IU] | SUBCUTANEOUS | Status: DC
  Administered 2019-12-05 – 2019-12-10 (×29): 4 [IU] via SUBCUTANEOUS

## 2019-12-05 MED ORDER — DOXAZOSIN MESYLATE 1 MG PO TABS
1.0000 mg | ORAL_TABLET | Freq: Every day | ORAL | Status: DC
  Administered 2019-12-05 – 2019-12-16 (×12): 1 mg via ORAL
  Filled 2019-12-05 (×12): qty 1

## 2019-12-05 MED ORDER — ENOXAPARIN SODIUM 40 MG/0.4ML ~~LOC~~ SOLN
40.0000 mg | SUBCUTANEOUS | Status: DC
  Administered 2019-12-06 – 2019-12-18 (×13): 40 mg via SUBCUTANEOUS
  Filled 2019-12-05 (×13): qty 0.4

## 2019-12-05 MED ORDER — QUETIAPINE FUMARATE 50 MG PO TABS
100.0000 mg | ORAL_TABLET | Freq: Two times a day (BID) | ORAL | Status: DC
  Administered 2019-12-05 – 2019-12-08 (×6): 100 mg
  Filled 2019-12-05 (×6): qty 2

## 2019-12-05 NOTE — Consult Note (Signed)
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 Endotracheal and feeding tubes are in grossly good position. Right internal jugular catheter is noted with tip in expected position of cavoatrial junction. Minimal bibasilar subsegmental atelectasis is noted. There does appear to be interval development of probable pneumomediastinum and possibly pneumopericardium. Bony thorax is unremarkable. IMPRESSION: Interval development of probable pneumomediastinum and possibly pneumopericardium. CT scan of the chest is recommended for further evaluation. Stable support apparatus. These results will be called to the ordering clinician or representative by the Radiologist Assistant, and communication documented in the PACS or zVision Dashboard. Electronically Signed   By: Lupita Raider M.D.   On: 11/21/2019 09:17   DG Chest Port 1 View  Result Date: 11/19/2019 CLINICAL DATA:  64 year old male with history of respiratory failure. EXAM: PORTABLE CHEST 1 VIEW COMPARISON:  Chest x-ray 11/16/2019. FINDINGS: An endotracheal tube is in place with tip 6.2 cm above the  carina. A feeding tube is seen extending into the abdomen, however, the tip of the feeding tube extends below the lower margin of the image. There is a right-sided internal jugular central venous catheter with tip terminating in the right atrium. Lung volumes are slightly low. Bibasilar opacities are favored to reflect areas of subsegmental atelectasis. No confluent consolidative airspace disease. No definite pleural effusions. No evidence of pulmonary edema. Heart size is normal. Upper mediastinal contours are within normal limits. Status post median sternotomy for CABG. IMPRESSION: 1. Support apparatus, as above. 2. Lung volumes are slightly low with bibasilar areas of subsegmental atelectasis. Electronically Signed   By: Trudie Reed M.D.   On: 11/19/2019 07:08   DG Chest Port 1 View  Result Date: 11/16/2019 CLINICAL DATA:  Acute respiratory failure. EXAM: PORTABLE CHEST 1 VIEW COMPARISON:  November 16, 2019  FINDINGS: There is stable endotracheal tube, nasogastric tube and right internal jugular venous catheter positioning. Multiple sternal wires and vascular clips are noted. Very mild atelectasis is seen within the right lung base. There is no evidence of acute infiltrate, pleural effusion or pneumothorax. The heart size and mediastinal contours are within normal limits. Degenerative changes seen throughout the thoracic spine IMPRESSION: Very mild right basilar atelectasis. Electronically Signed   By: Aram Candela M.D.   On: 11/16/2019 23:46   DG CHEST PORT 1 VIEW  Result Date: 11/16/2019 CLINICAL DATA:  Respiratory distress EXAM: PORTABLE CHEST 1 VIEW COMPARISON:  11/16/2019 FINDINGS: Sternal wires overlie normal cardiac silhouette. Feeding tube extends the stomach. Central venous line unchanged. No effusion, infiltrate pneumothorax.  No acute osseous abnormality. IMPRESSION: 1. No interval change. 2. Stable support apparatus. 3. No evidence of pneumonia. Electronically Signed   By: Genevive Bi M.D.   On: 11/16/2019 11:33   DG Chest Port 1 View  Result Date: 11/16/2019 CLINICAL DATA:  Respiratory distress, COVID-19 positive. EXAM: PORTABLE CHEST 1 VIEW COMPARISON:  11/13/2019 FINDINGS: Enteric tube courses into the stomach as tip is not visualized. Right IJ central venous catheter has tip just below the cavoatrial junction unchanged. Endotracheal tube tip is difficult to visualize but appears approximately 5.1 cm above the carina. Lungs are adequately inflated with subtle opacification over the left base unchanged and Barella be due to atelectasis or infection. No effusion. Cardiomediastinal silhouette and remainder the exam is unchanged. IMPRESSION: Mild left basilar opacification which Sottile be due to atelectasis or infection. Tubes and lines as described. Electronically Signed   By: Elberta Fortis M.D.   On: 11/16/2019 08:52   DG Chest Port 1 View  Result Date: 11/13/2019 CLINICAL DATA:  Acute respiratory failure EXAM: PORTABLE CHEST 1 VIEW COMPARISON:  Two days ago FINDINGS: Endotracheal tube tip is between the clavicular heads and carina. Right IJ line with tip at the upper cavoatrial junction. The enteric tube tip is at the stomach. Normal heart size. CABG. Worsening aeration at the bases with obscured diaphragm. There is patchy ground-glass and interstitial opacities. Possible trace pleural effusions. No visible pneumothorax. IMPRESSION: 1. Stable and unremarkable hardware positioning. 2. Worsening aeration at the bases. Electronically Signed   By: Marnee Spring M.D.   On: 11/13/2019 05:31   Portable Chest x-ray  Addendum Date: 11/11/2019   ADDENDUM REPORT: 11/11/2019 16:26 ADDENDUM: Distal tip of right internal jugular catheter appears to be in expected position of cavoatrial junction. Electronically Signed   By: Lupita Raider M.D.   On: 11/11/2019 16:26   Result Date: 11/11/2019 CLINICAL DATA:  Nasogastric and endotracheal tube placement. EXAM: PORTABLE CHEST 1 VIEW COMPARISON:   November 10, 2019. FINDINGS: Endotracheal and nasogastric tubes appear to be in grossly good position. Stable cardiomediastinal silhouette. No pneumothorax is noted. Mild bibasilar subsegmental atelectasis is noted. Bony thorax is unremarkable. IMPRESSION: Endotracheal and nasogastric tubes in grossly good position. Mild bibasilar subsegmental atelectasis. Electronically Signed: By: Lupita Raider M.D. On: 11/11/2019 14:29   DG Chest Port 1 View  Result Date: 11/10/2019 CLINICAL DATA:  COVID positive on 11-09-19. Confused. Respiratory distress. EXAM: PORTABLE CHEST 1 VIEW COMPARISON:  11/09/2019 and earlier exams. FINDINGS: Interstitial and subtle hazy airspace lung opacities in the mid to lower lungs is without change from the previous exam. No new lung abnormalities. No convincing pleural effusion and no pneumothorax. Stable changes from prior CABG surgery. IMPRESSION: 1. No significant change from the previous day's study.  2. Interstitial and subtle hazy airspace lung opacities that are consistent with multifocal pneumonia. Electronically Signed   By: Amie Portland M.D.   On: 11/10/2019 07:18   DG Chest Port 1 View  Result Date: 11/28/2019 CLINICAL DATA:  SOB, increased B.P., tachycardia, O2 sats in low - mid 90's, COVID exposure. Hx CABG 2005, Diabetes, Chronic bronchitis, and asthma. EXAM: PORTABLE CHEST - 1 VIEW COMPARISON:  08/04/2015 FINDINGS: Lungs are clear. Heart size and mediastinal contours are within normal limits. Previous CABG. No definite effusion. No pneumothorax. Cervical fixation hardware noted. IMPRESSION: No acute disease post CABG. Electronically Signed   By: Corlis Leak M.D.   On: 11/28/2019 13:41   DG Chest Port 1V same Day  Result Date: 11/09/2019 CLINICAL DATA:  Respiratory distress. COVID positive unit. EXAM: PORTABLE CHEST 1 VIEW COMPARISON:  Chest x-ray dated 11/24/2019. FINDINGS: Borderline cardiomegaly, stable. New hazy ground-glass opacities within the mid and lower lung zones  bilaterally, suspicious for multifocal pneumonia. No pleural effusion or pneumothorax is seen. IMPRESSION: New hazy ground-glass opacities within the mid and lower lung zones bilaterally, suspicious for multifocal pneumonia, alternatively mild edema and/or atelectasis. Electronically Signed   By: Bary Richard M.D.   On: 11/09/2019 08:24   VAS [place] LOWER EXTREMITY VENOUS (DVT)  Result Date: 11/10/2019  Lower Venous DVTStudy Indications: Covid-19, elevated D-dimer.  Comparison Study: No prior study on file Performing Technologist: Sherren Kerns RVS  Examination Guidelines: A complete evaluation includes B-mode imaging, spectral Doppler, color Doppler, and power Doppler as needed of all accessible portions of each vessel. Bilateral testing is considered an integral part of a complete examination. Limited examinations for reoccurring indications [place] be performed as noted. The reflux portion of the exam is performed with the patient in reverse Trendelenburg.  +---------+---------------+---------+-----------+----------+--------------+ RIGHT    CompressibilityPhasicitySpontaneityPropertiesThrombus Aging +---------+---------------+---------+-----------+----------+--------------+ CFV      Full           Yes      Yes                                 +---------+---------------+---------+-----------+----------+--------------+ SFJ      Full                                                        +---------+---------------+---------+-----------+----------+--------------+ FV Prox  Full                                                        +---------+---------------+---------+-----------+----------+--------------+ FV Mid   Full                                                        +---------+---------------+---------+-----------+----------+--------------+ FV DistalFull                                                         +---------+---------------+---------+-----------+----------+--------------+  PFV      Full                                                        +---------+---------------+---------+-----------+----------+--------------+ POP      Full           Yes      Yes                                 +---------+---------------+---------+-----------+----------+--------------+ PTV      Full                                                        +---------+---------------+---------+-----------+----------+--------------+ PERO     Full                                                        +---------+---------------+---------+-----------+----------+--------------+   +---------+---------------+---------+-----------+----------+--------------+ LEFT     CompressibilityPhasicitySpontaneityPropertiesThrombus Aging +---------+---------------+---------+-----------+----------+--------------+ CFV      Full           Yes      Yes                                 +---------+---------------+---------+-----------+----------+--------------+ SFJ      Full                                                        +---------+---------------+---------+-----------+----------+--------------+ FV Prox  Full                                                        +---------+---------------+---------+-----------+----------+--------------+ FV Mid   Full                                                        +---------+---------------+---------+-----------+----------+--------------+ FV DistalFull                                                        +---------+---------------+---------+-----------+----------+--------------+ PFV      Full                                                        +---------+---------------+---------+-----------+----------+--------------+  POP      Full           Yes      Yes                                  +---------+---------------+---------+-----------+----------+--------------+ PTV      Full                                                        +---------+---------------+---------+-----------+----------+--------------+ PERO     Full                                                        +---------+---------------+---------+-----------+----------+--------------+     Summary: BILATERAL: - No evidence of deep vein thrombosis seen in the lower extremities, bilaterally.   *See table(s) above for measurements and observations. Electronically signed by Gretta Began MD on 11/10/2019 at 8:17:42 AM.    Final    [place] EKG SITE RITE  Result Date: 11/29/2019 If Site Rite image not attached, placement could not be confirmed due to current cardiac rhythm.   Labs:  CBC: Recent Labs    12/02/19 0447 12/03/19 0421 12/04/19 0624 12/05/19 0530  WBC 8.5 8.6 10.7* 10.8*  HGB 9.6* 9.9* 10.1* 9.7*  HCT 30.3* 31.0* 31.5* 31.3*  PLT 133* 164 187 210    COAGS: No results for input(s): INR, APTT in the last 8760 hours.  BMP: Recent Labs    12/02/19 0447 12/03/19 0421 12/04/19 0624 12/05/19 0530  NA 137 137 136 138  K 3.7 4.4 4.1 4.0  CL 90* 92* 93* 95*  CO2 39* 38* 36* 36*  GLUCOSE 180* 112* 225* 233*  BUN CALCIUM 7.8* 7.2* 8.0* 8.0*  CREATININE 0.47* 0.45* 0.40* 0.43*  GFRNONAA >60 >60 >60 >60  GFRAA >60 >60 >60 >60    LIVER FUNCTION TESTS: Recent Labs    11/17/19 0509 11/17/19 0509 11/20/19 0039 11/22/19 0404 11/23/19 0410 12/04/19 0624  BILITOT 0.7  --   --  0.9 1.1 0.7  AST 44*  --   --  34 30 41  ALT 31  --   --  35 31 40  ALKPHOS 68  --   --  57 41 70  PROT 5.3*  --   --  5.1* 4.6* 5.4*  ALBUMIN 2.3*   < > 2.3* 2.4* 2.2* 1.5*   < > = values in this interval not displayed.    TUMOR MARKERS: No results for input(s): AFPTM, CEA, CA199, CHROMGRNA in the last 8760 hours.  Assessment and Plan:  Covic PNA- now off precautions Vent/trach- Resp  failure Encephalopathy Dysphagia; malnutrition Deconditioning Scheduled for percutaneous gastric tube placement - when G tube available in IR Risks and benefits image guided gastrostomy tube placement was discussed with the patient's wife Sybil via phone including, but not limited to the need for a barium enema during the procedure, bleeding, infection, peritonitis and/or damage to adjacent structures.  All questions were answered, she is agreeable to proceed. Consent signed and in chart.  Thank you for this interesting consult.  I greatly enjoyed meeting [name] and look forward to participating in their care.  A copy of this report was sent to the requesting provider on this date.  Electronically Signed: Robet Leu, PA-C 12/05/2019, 10:27 AM   I spent a total of 40 Minutes    in face to face in clinical consultation, greater than 50% of which was counseling/coordinating care for G tube placement

## 2019-12-05 NOTE — Progress Notes (Signed)
Physical Therapy Treatment Patient Details Name: [name] MRN: [id number] DOB: [birth year] Today's Date: 12/05/2019    History of Present Illness pt is a 64 y/o male with pmhx significant for CAD, DM, CABG, ACDF admitted 4/2 with COVID PNA and delirium, intubated 4/5, proned and paralyzed 4/5-4/9, sedated until 4/12, Bronchoscopy for tube change 4/16, 4/19 trached.  Remains sedated.    PT Comments    Pt in a low arousal state on arrival.  Eyes cracked open.  Pt was not aroused enough to interact or assist mobility at all.  No spontaneous movements noted today.  All 4's ranged before mobility.  In sitting after 1.5 -2 mins, pt's vitals started to decline, patient not tolerating the sitting trial.  Pt returned to supine and began to recovery very slowly.   Follow Up Recommendations  LTACH;Other (comment);SNF(working toward least restrictive environment)     Equipment Recommendations  Other (comment)(TBA)    Recommendations for Other Services       Precautions / Restrictions Precautions Precautions: Other (comment)(cardiopulmonary systems compromised.) Restrictions Weight Bearing Restrictions: No    Mobility  Bed Mobility Overal bed mobility: Needs Assistance Bed Mobility: Supine to Sit;Sit to Supine     Supine to sit: Total assist;+2 for physical assistance Sit to supine: Total assist;+2 for physical assistance   General bed mobility comments: pt did not arouse enough to move.  Dependent for all movement on sedation.  Transfers                 General transfer comment: NT  Ambulation/Gait             General Gait Details: NT   Stairs             Wheelchair Mobility    Modified Rankin (Stroke Patients Only)       Balance Overall balance assessment: Needs assistance   Sitting balance-Leahy Scale: Zero                                      Cognition Arousal/Alertness: (sedated) Behavior During Therapy: Flat  affect Overall Cognitive Status: Impaired/Different from baseline                                        Exercises Other Exercises Other Exercises: general PROM to bil LE and bil U's    General Comments General comments (skin integrity, edema, etc.): Initially HR 118 bpm, SpO2 94% on vent support, RR in the low 20's.  After about 1.5 min sitting EOB  HR rose to Hight 120's to lower 130's, Sats dropped to b/w 81-83%, RR 50  to 60"s rpm, BP 104/70.  Pt quckly returned to supine and pt started a slow recovery toward pre activity levels.      Pertinent Vitals/Pain Faces Pain Scale: No hurt    Home Living Family/patient expects to be discharged to:: Private residence Living Arrangements: Spouse/significant other;Children;Other (Comment)(1 son still lives with them) Available Help at Discharge: Family;Available PRN/intermittently;Available 24 hours/day(wife and son work but she can take FMLA) Type of Home: Mobile home Home Access: Stairs to enter Entrance Stairs-Rails: Right;Left Home Layout: One level Home Equipment: None      Prior Function Level of Independence: Independent          PT Goals (current goals can now be found  in the care plan section) Acute Rehab PT Goals Patient Stated Goal: pt unable PT Goal Formulation: Patient unable to participate in goal setting Time For Goal Achievement: 12/16/19 Potential to Achieve Goals: Fair    Frequency    Min 2X/week      PT Plan      Co-evaluation              AM-PAC PT "6 Clicks" Mobility   Outcome Measure  Help needed turning from your back to your side while in a flat bed without using bedrails?: Total Help needed moving from lying on your back to sitting on the side of a flat bed without using bedrails?: Total Help needed moving to and from a bed to a chair (including a wheelchair)?: Total Help needed standing up from a chair using your arms (e.g., wheelchair or bedside chair)?: Total Help  needed to walk in hospital room?: Total Help needed climbing 3-5 steps with a railing? : Total 6 Click Score: 6    End of Session Equipment Utilized During Treatment: Oxygen Activity Tolerance: Patient tolerated treatment well Patient left: in bed;with call bell/phone within reach;with nursing/sitter in room;with family/visitor present;with SCD's reapplied Nurse Communication: Mobility status PT Visit Diagnosis: Muscle weakness (generalized) (M62.81);Other symptoms and signs involving the nervous system (R29.898);Adult, failure to thrive (R62.7)     Time: 8144-8185 PT Time Calculation (min) (ACUTE ONLY): 20 min  Charges:  $Therapeutic Activity: 8-22 mins                     12/05/2019  Ginger Carne., PT Acute Rehabilitation Services [phone]  (pager) [phone]  (office)   Tessie Fass Chelsea Pedretti 12/05/2019, 11:50 AM

## 2019-12-05 NOTE — Progress Notes (Signed)
Inpatient Diabetes Program Recommendations  AACE/ADA: New Consensus Statement on Inpatient Glycemic Control (2015)  Target Ranges:  Prepandial:   less than 140 mg/dL      Peak postprandial:   less than 180 mg/dL (1-2 hours)      Critically ill patients:  140 - 180 mg/dL   Lab Results  Component Value Date   GLUCAP 199 (H) 12/05/2019   HGBA1C 7.5 (H) 12/02/2019    Review of Glycemic Control Results for TRESHAUN, CARRICO (MRN [id number]) as of 12/04/2019 09:43  Ref. Range 12/03/2019 16:01 12/03/2019 20:23 12/04/2019 00:27 12/04/2019 04:30 12/04/2019 08:15  Glucose-Capillary Latest Ref Range: 70 - 99 mg/dL 701 (H) 410 (H) 301 (H) 198 (H) 222 (H)  Results for ROLLIN, KOTOWSKI (MRN [id number]) as of 12/05/2019 10:48  Ref. Range 12/04/2019 08:15 12/04/2019 11:47 12/04/2019 16:02 12/04/2019 20:02 12/04/2019 23:45 12/05/2019 03:40 12/05/2019 07:56  Glucose-Capillary Latest Ref Range: 70 - 99 mg/dL 797 (H) 282 (H) 060 (H) 237 (H) 195 (H) 247 (H) 199 (H)   Diabetes history: Dm 2 Outpatient Diabetes medications: Metformin 1000 mg bid Current orders for Inpatient glycemic control:  Lantus 10 units bid Novolog 0-15 units Q4 hours Tradjenta 5 mg Daily  Vital AF 65 ml/hour  Inpatient Diabetes Program Recommendations:    Add Novolog 4 units Q4 hours Tube Feed Coverage.   Thanks,  Christena Deem RN, MSN, BC-ADM Inpatient Diabetes Coordinator Team Pager [phone] (8a-5p)
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
## 2019-12-06 LAB — GLUCOSE, CAPILLARY
Glucose-Capillary: 205 mg/dL — ABNORMAL HIGH (ref 70–99)
Glucose-Capillary: 163 mg/dL — ABNORMAL HIGH (ref 70–99)
Glucose-Capillary: 180 mg/dL — ABNORMAL HIGH (ref 70–99)
Glucose-Capillary: 173 mg/dL — ABNORMAL HIGH (ref 70–99)
Glucose-Capillary: 151 mg/dL — ABNORMAL HIGH (ref 70–99)

## 2019-12-06 MED ORDER — FENTANYL BOLUS VIA INFUSION
100.0000 ug | Freq: Once | INTRAVENOUS | Status: AC
  Administered 2019-12-06: 100 ug via INTRAVENOUS

## 2019-12-06 MED ORDER — FENTANYL BOLUS VIA INFUSION
25.0000 ug | INTRAVENOUS | Status: DC | PRN
  Administered 2019-12-06 (×2): 100 ug via INTRAVENOUS
  Filled 2019-12-06: qty 100
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
## 2019-12-07 DIAGNOSIS — U071 COVID-19: Secondary | ICD-10-CM | POA: Diagnosis not present

## 2019-12-07 DIAGNOSIS — N139 Obstructive and reflux uropathy, unspecified: Secondary | ICD-10-CM

## 2019-12-07 DIAGNOSIS — Z930 Tracheostomy status: Secondary | ICD-10-CM

## 2019-12-07 DIAGNOSIS — G9340 Encephalopathy, unspecified: Secondary | ICD-10-CM | POA: Diagnosis not present

## 2019-12-07 DIAGNOSIS — R0603 Acute respiratory distress: Secondary | ICD-10-CM

## 2019-12-07 DIAGNOSIS — J80 Acute respiratory distress syndrome: Secondary | ICD-10-CM

## 2019-12-07 DIAGNOSIS — J9601 Acute respiratory failure with hypoxia: Secondary | ICD-10-CM | POA: Diagnosis not present

## 2019-12-07 DIAGNOSIS — E43 Unspecified severe protein-calorie malnutrition: Secondary | ICD-10-CM

## 2019-12-07 LAB — BASIC METABOLIC PANEL
Anion gap: 8 (ref 5–15)
BUN: 18 mg/dL (ref 8–23)
CO2: 35 mmol/L — ABNORMAL HIGH (ref 22–32)
Calcium: 8.1 mg/dL — ABNORMAL LOW (ref 8.9–10.3)
Chloride: 97 mmol/L — ABNORMAL LOW (ref 98–111)
Creatinine, Ser: 0.35 mg/dL — ABNORMAL LOW (ref 0.61–1.24)
GFR calc Af Amer: >60 mL/min (ref >60)
GFR calc non Af Amer: >60 mL/min (ref >60)
Glucose, Bld: 156 mg/dL — ABNORMAL HIGH (ref 70–99)
Potassium: 4.1 mmol/L (ref 3.5–5.1)
Sodium: 140 mmol/L (ref 135–145)

## 2019-12-07 LAB — CBC
HCT: 30.4 % — ABNORMAL LOW (ref 39.0–52.0)
Hemoglobin: 9.3 g/dL — ABNORMAL LOW (ref 13.0–17.0)
MCH: 28.7 pg (ref 26.0–34.0)
MCHC: 30.6 g/dL (ref 30.0–36.0)
MCV: 93.8 fL (ref 80.0–100.0)
Platelets: 240 10*3/uL (ref 150–400)
RBC: 3.24 MIL/uL — ABNORMAL LOW (ref 4.22–5.81)
RDW: 14.8 % (ref 11.5–15.5)
WBC: 11.1 10*3/uL — ABNORMAL HIGH (ref 4.0–10.5)
nRBC: 0 % (ref 0.0–0.2)

## 2019-12-07 LAB — GLUCOSE, CAPILLARY
Glucose-Capillary: 119 mg/dL — ABNORMAL HIGH (ref 70–99)
Glucose-Capillary: 159 mg/dL — ABNORMAL HIGH (ref 70–99)
Glucose-Capillary: 162 mg/dL — ABNORMAL HIGH (ref 70–99)
Glucose-Capillary: 169 mg/dL — ABNORMAL HIGH (ref 70–99)
Glucose-Capillary: 192 mg/dL — ABNORMAL HIGH (ref 70–99)
Glucose-Capillary: 218 mg/dL — ABNORMAL HIGH (ref 70–99)
Glucose-Capillary: 252 mg/dL — ABNORMAL HIGH (ref 70–99)

## 2019-12-07 MED ORDER — FUROSEMIDE 10 MG/ML IJ SOLN
40.0000 mg | Freq: Four times a day (QID) | INTRAMUSCULAR | Status: AC
  Administered 2019-12-07 (×2): 40 mg via INTRAVENOUS
  Filled 2019-12-07 (×2): qty 4

## 2019-12-07 MED ORDER — MIDAZOLAM HCL 2 MG/2ML IJ SOLN
INTRAMUSCULAR | Status: AC
  Filled 2019-12-07: qty 4

## 2019-12-07 MED ORDER — FENTANYL 2500MCG IN NS 250ML (10MCG/ML) PREMIX INFUSION
0.0000 ug/h | INTRAVENOUS | Status: DC
  Administered 2019-12-07: 50 ug/h via INTRAVENOUS
  Administered 2019-12-08 – 2019-12-09 (×2): 100 ug/h via INTRAVENOUS
  Administered 2019-12-10 – 2019-12-11 (×2): 125 ug/h via INTRAVENOUS
  Administered 2019-12-12: 150 ug/h via INTRAVENOUS
  Administered 2019-12-12: 125 ug/h via INTRAVENOUS
  Administered 2019-12-13: 50 ug/h via INTRAVENOUS
  Administered 2019-12-14: 125 ug/h via INTRAVENOUS
  Administered 2019-12-15: 150 ug/h via INTRAVENOUS
  Administered 2019-12-15: 100 ug/h via INTRAVENOUS
  Administered 2019-12-16: 150 ug/h via INTRAVENOUS
  Administered 2019-12-17: 125 ug/h via INTRAVENOUS
  Administered 2019-12-18 – 2019-12-19 (×3): 150 ug/h via INTRAVENOUS
  Administered 2019-12-20: 125 ug/h via INTRAVENOUS
  Administered 2019-12-21: 100 ug/h via INTRAVENOUS
  Administered 2019-12-21: 125 ug/h via INTRAVENOUS
  Administered 2019-12-23: 25 ug/h via INTRAVENOUS
  Administered 2019-12-23: 50 ug/h via INTRAVENOUS
  Filled 2019-12-07 (×20): qty 250

## 2019-12-07 MED ORDER — TRAZODONE HCL 50 MG PO TABS: 100.0000 mg | ORAL_TABLET | Freq: Every evening | ORAL | Status: DC | PRN

## 2019-12-07 MED ORDER — MIDAZOLAM HCL 2 MG/2ML IJ SOLN
4.0000 mg | Freq: Once | INTRAMUSCULAR | Status: AC
  Administered 2019-12-07: 4 mg via INTRAVENOUS

## 2019-12-07 MED ORDER — FENTANYL CITRATE (PF) 100 MCG/2ML IJ SOLN
100.0000 ug | Freq: Once | INTRAMUSCULAR | Status: AC
  Administered 2019-12-07: 100 ug via INTRAVENOUS
  Filled 2019-12-07: qty 2

## 2019-12-07 MED ORDER — ROCURONIUM BROMIDE 10 MG/ML (PF) SYRINGE
PREFILLED_SYRINGE | INTRAVENOUS | Status: AC
  Administered 2019-12-07: 100 mg
  Filled 2019-12-07: qty 10

## 2019-12-07 MED ORDER — ROCURONIUM BROMIDE 50 MG/5ML IV SOLN: 70.0000 mg | Freq: Once | INTRAVENOUS | Status: DC

## 2019-12-07 NOTE — Progress Notes (Signed)
Pt's trach changed due to leak by Dr. Katrinka Blazing with tube exchanger and verified via bronch and ETC02. Trach changed to a #[id number] distal Shiley. Pt remained stable throughout.

## 2019-12-07 NOTE — Procedures (Signed)
Bronchoscopy and trach change note  Indication: high trach, air leak issues, question positioning  Procedure: Patient given fentanyl, determined to be heavily sedated then given dose of rocuronium and versed.  Bronchoscope advanced into trach, determined to be about 8 cm above carina.  Suction catheter adavanced through existing 6-0 shiley, balloon deflated and shiley removed over suction catheter.  New distal XLT shiley advanced over suction catheter and balloon inflated, good position noted with bronchoscope ~3-4 cm above carina  Myrla Halsted MD PCCM

## 2019-12-07 NOTE — Progress Notes (Signed)
NAME:  Kalib Bhagat Binning, MRN:  [id number], DOB:  1956-07-31, LOS: 29 ADMISSION DATE:  11/22/2019, CONSULTATION DATE:  11/11/19 REFERRING MD:  Jerral Kemo, CHIEF COMPLAINT:  Dyspnea     Brief History   64 year old man admitted 4/2 with COVID pneumonia and delirium after 1 week of symptoms.  Eventually required intubation 4/5. Proned and paralyzed for 4/5-4/9. Continued to require sedation and paralytics until 4/12 for dyssynchrony.  Delirious on sedation holiday, but able to tolerate some PSV 4/13. Marked increase ventilator requirements with high Ppeak with development of pneumomediastinum and pneumopericardium. Unresponsive to bronchodilators   Bronchoscopy with tube exchange on 4/16 - significant accumulation of secretions within lumen of endotracheal tube.  Significant improvement in airway Ppeak and flow pattern following exchange and aggressive nebulization. Minimal secretions seen.   History of present illness   See above  Past Medical History   Past Medical History:  Diagnosis Date  . Allergic rhinitis   . Arthritis of left acromioclavicular joint 07/25/2014  . Asthma   . Chronic bronchitis (HCC)   . Chronic kidney disease    H/O STONES  . Colon polyp   . Coronary artery disease   . Diabetes mellitus without complication (HCC)   . Diverticula of colon   . GERD (gastroesophageal reflux disease)   . Hx of heart bypass surgery 2005  . Hypercholesteremia   . Impingement syndrome of left shoulder 07/25/2014  . Laceration of brachial artery 03/11/2016   left arm  . Lumbar disc disease   . Partial nontraumatic rupture of right rotator cuff 10/20/2016   Significant Hospital Events   4/19: tachypneic overnight and this am. Unable to wean sedation. Tracheostomy done  4/20: successful trach yesterday but de-recruited and on some escalated settings today. Remains tachypneic this am. Hypoglycemic yesterday and lantus changed 4/22: started on empiric abx but resp cx with abundant gpc.  Improving oxygenation requirement but remains with rass -4 despite light sedation.  4/23: mucous plugging overnight and this am with copious/thick secretions.  4/28: issues overnight with increased vent settings. Still without purposeful responses. Fever overnight as well to 101.1. remains minimally responsive and unable to wean iv sedation except for short periods, despite increase in po agents.  4/29: somewhat improved settings overnight on  60/8. Still with temps overnight to 100.7 5/1: Continued improvement in vent requirement 50/8, remains on several sedating medications including continuous fentanyl and precedex   Consults:  none  Procedures:  04/05 Rt CVC > 4/24 04/16 Bronchoscopy  04/20 Tracheostomy  4/24 Rt PICC  Significant Diagnostic Tests:  4/22 CXR:  1. Lines and tubes stable position. 2. Persistent bibasilar pulmonary infiltrates/edema with interim slight clearing from prior exam. 3. Prior CABG. Heart size stable.  4/24 CXR: Slight increase in interstitial and airspace opacities particularly in the lower lobes. Findings suspicious for worsening asymmetric edema or infection.  Head CT 4/27 Mild chronic ischemic white matter disease. No acute intracranial abnormality seen.  Micro Data:  COVID 4/2 >Positive  Blood cultures 4/2 > negative  Respiratory culture 4/5 > Negative  Respiratory culture 4/12 > Normal flora  Respiratory culture 4/21 > Normal flora   Antimicrobials:  4/21 Vancomycin > 4/23 4/21 Zosyn > 4/26  Interim history/subjective:  Lying in bed on full vent support through trach, remains on Fentanyl and Precedex continuously. Positive 6L.  Objective   Blood pressure 130/68, pulse (!) 104, temperature 98.8 F (37.1 C), temperature source Axillary, resp. rate 19, height 5\' 8"  (1.727 m), weight 81.5  kg, SpO2 97 %.    Vent Mode: PRVC FiO2 (%):  [50 %-60 %] 50 % Set Rate:  [20 bmp] 20 bmp Vt Set:  [480 mL] 480 mL PEEP:  [8 cmH20] 8 cmH20 Plateau  Pressure:  [16 cmH20-28 cmH20] 26 cmH20   Intake/Output Summary (Last 24 hours) at 12/07/2019 1002 Last data filed at 12/07/2019 0900 Gross per 24 hour  Intake 1819.76 ml  Output 1170 ml  Net 649.76 ml   Filed Weights   12/05/19 0500 12/06/19 0200 12/07/19 0447  Weight: 81.4 kg 81.2 kg 81.5 kg    Examination: General: Chronically ill appearing elderly male on mechanical ventilation through trach, in NAD HEENT: , MM pink/moist, PERRL,  Neuro: Sedated on vent, unable to follow any commands  CV: s1s2 regular rate and rhythm, no murmur, rubs, or gallops,  PULM:  Trach midline, #6 Shiley cuffed trach in place, Clear to ascultation bilaterally, no added breath sounds, oxygen saturations 95-100 on 50% FiO2 GI: soft, bowel sounds active in all 4 quadrants, non-tender, non-distended, tolerating TF Extremities: warm/dry, Generalized 2+ pitting edema  Skin: no rashes or lesions  Resolved Hospital Problem list   COVID-19 pneumonia -Has completed full course of remdesivir and dexamethasone, off isolation 4/23.   Assessment & Plan:  Acute hypoxic respiratory failure requiring mechanical ventilation due to COVID-19 pneumonia.  S/p treatment for possible bacterial PNA, HCAP -Underwent tracheostomy 4/20 P: Continue ventilator support with lung protective strategies  FiO2 requirement down to 50 continue to wean as able  Head of bed elevated 30 degrees. Plateau pressures less than 30 cm H20.  Follow intermittent chest x-ray and ABG.   SAT/SBT as tolerated, mentation preclude transition to ATC Ensure adequate pulmonary hygiene  VAP bundle in place  PAD protocol Fungitell pending   Asthma hx  Pneumomediastinum, pneumopericardium -S/P second round of steroids P: Continue scheduled and PRN BDs Continue Singular   Toxic metabolic encephalopathy with hypoactive delirium. P: Continue to wean sedation as able will again discontinue fentanyl drip in favor of PRNs Continue Klonopin abd  Quetiapine  Change Trazadone to PRN HS  RASS goal 0 to -1 Continue PO Oxy  Try to minimize sedation as able   t2dm with hyperglycemia -a1c 7.5. Uncontrolled P: SSI Continue Lantus  CBG checks Q4  Urinary retention:  - Patient apparently has a history of urinary retention on foley cath P: Continue Doxazosin   Best practice:  Diet: TF via NG Pain/Anxiety/Delirium protocol (if indicated): see above VAP protocol (if indicated): ordered DVT prophylaxis: lovenox 0.5mg /kg BID GI prophylaxis: PPI Glucose control: lantus + SSI Mobility: BR Code Status: full Family Communication: updated wife at bedside. Disposition: ICU   Labs   CBC: Recent Labs  Lab 12/02/19 0447 12/03/19 0421 12/04/19 0624 12/05/19 0530 12/07/19 0559  WBC 8.5 8.6 10.7* 10.8* 11.1*  NEUTROABS 6.4 6.1 8.8* 8.5*  --   HGB 9.6* 9.9* 10.1* 9.7* 9.3*  HCT 30.3* 31.0* 31.5* 31.3* 30.4*  MCV 93.2 93.4 92.1 93.2 93.8  PLT 133* 164 187 210 267    Basic Metabolic Panel: Recent Labs  Lab 12/02/19 0447 12/03/19 0421 12/04/19 0624 12/05/19 0530 12/07/19 0559  NA 137 137 136 138 140  K 3.7 4.4 4.1 4.0 4.1  CL 90* 92* 93* 95* 97*  CO2 39* 38* 36* 36* 35*  GLUCOSE 180* 112* 225* 233* 156*  BUN 19 17 18 21 18   CREATININE 0.47* 0.45* 0.40* 0.43* 0.35*  CALCIUM 7.8* 7.2* 8.0* 8.0* 8.1*  MG 1.8  1.8  --  2.0  --   PHOS  --   --   --  2.9  --    GFR: Estimated Creatinine Clearance: 89.1 mL/min (A) (by C-G formula based on SCr of 0.35 mg/dL (L)). Recent Labs  Lab 12/03/19 0421 12/04/19 0624 12/05/19 0530 12/07/19 0559  WBC 8.6 10.7* 10.8* 11.1*    Liver Function Tests: Recent Labs  Lab 12/04/19 0624  AST 41  ALT 40  ALKPHOS 70  BILITOT 0.7  PROT 5.4*  ALBUMIN 1.5*   No results for input(s): LIPASE, AMYLASE in the last 168 hours. No results for input(s): AMMONIA in the last 168 hours.  ABG    Component Value Date/Time   PHART 7.448 11/17/2019 0519   PCO2ART 54.0 (H) 11/17/2019 0519    PO2ART 90.0 11/17/2019 0519   HCO3 37.2 (H) 11/17/2019 0519   TCO2 39 (H) 11/17/2019 0519   ACIDBASEDEF 1.0 11/11/2019 1529   O2SAT 97.0 11/17/2019 0519     Coagulation Profile: No results for input(s): INR, PROTIME in the last 168 hours.  Cardiac Enzymes: No results for input(s): CKTOTAL, CKMB, CKMBINDEX, TROPONINI in the last 168 hours.  HbA1C: Hgb A1c MFr Bld  Date/Time Value Ref Range Status  12-Nov-2019 06:51 PM 7.5 (H) 4.8 - 5.6 % Final    Comment:    (NOTE) Pre diabetes:          5.7%-6.4% Diabetes:              >6.4% Glycemic control for   <7.0% adults with diabetes   12/19/2018 02:21 PM 7.7 (H) 4.6 - 6.5 % Final    Comment:    Glycemic Control Guidelines for People with Diabetes:Non Diabetic:  <6%Goal of Therapy: <7%Additional Action Suggested:  >8%     CBG: Recent Labs  Lab 12/06/19 1559 12/06/19 2012 12/07/19 0007 12/07/19 0404 12/07/19 0815  GLUCAP 173* 151* 192* 119* 159*     Critical care time: 45 minutes   Performed by: Delfin Gant   Total critical care time: 40 minutes  Critical care time was exclusive of separately billable procedures and treating other patients.  Critical care was necessary to treat or prevent imminent or life-threatening deterioration.  Critical care was time spent personally by me on the following activities: development of treatment plan with patient and/or surrogate as well as nursing, discussions with consultants, evaluation of patient's response to treatment, examination of patient, obtaining history from patient or surrogate, ordering and performing treatments and interventions, ordering and review of laboratory studies, ordering and review of radiographic studies, pulse oximetry and re-evaluation of patient's condition.  Delfin Gant, NP-C [facility] Contact / Pager information can be found on Amion  12/07/2019, 10:19 AM

## 2019-12-07 DEATH — deceased

## 2019-12-08 DIAGNOSIS — J9601 Acute respiratory failure with hypoxia: Secondary | ICD-10-CM | POA: Diagnosis not present

## 2019-12-08 DIAGNOSIS — G9340 Encephalopathy, unspecified: Secondary | ICD-10-CM | POA: Diagnosis not present

## 2019-12-08 LAB — CBC
HCT: 30.6 % — ABNORMAL LOW (ref 39.0–52.0)
Hemoglobin: 9.5 g/dL — ABNORMAL LOW (ref 13.0–17.0)
MCH: 29.2 pg (ref 26.0–34.0)
MCHC: 31 g/dL (ref 30.0–36.0)
MCV: 94.2 fL (ref 80.0–100.0)
Platelets: 268 10*3/uL (ref 150–400)
RBC: 3.25 MIL/uL — ABNORMAL LOW (ref 4.22–5.81)
RDW: 14.9 % (ref 11.5–15.5)
WBC: 10.2 10*3/uL (ref 4.0–10.5)
nRBC: 0.2 % (ref 0.0–0.2)

## 2019-12-08 LAB — GLUCOSE, CAPILLARY
Glucose-Capillary: 185 mg/dL — ABNORMAL HIGH (ref 70–99)
Glucose-Capillary: 178 mg/dL — ABNORMAL HIGH (ref 70–99)
Glucose-Capillary: 195 mg/dL — ABNORMAL HIGH (ref 70–99)
Glucose-Capillary: 191 mg/dL — ABNORMAL HIGH (ref 70–99)
Glucose-Capillary: 190 mg/dL — ABNORMAL HIGH (ref 70–99)

## 2019-12-08 LAB — BASIC METABOLIC PANEL
Anion gap: 8 (ref 5–15)
BUN: 21 mg/dL (ref 8–23)
CO2: 37 mmol/L — ABNORMAL HIGH (ref 22–32)
Calcium: 8.4 mg/dL — ABNORMAL LOW (ref 8.9–10.3)
Chloride: 94 mmol/L — ABNORMAL LOW (ref 98–111)
Creatinine, Ser: 0.4 mg/dL — ABNORMAL LOW (ref 0.61–1.24)
GFR calc Af Amer: >60 mL/min (ref >60)
GFR calc non Af Amer: >60 mL/min (ref >60)
Glucose, Bld: 207 mg/dL — ABNORMAL HIGH (ref 70–99)
Potassium: 3.7 mmol/L (ref 3.5–5.1)
Sodium: 139 mmol/L (ref 135–145)

## 2019-12-08 MED ORDER — TRAZODONE HCL 50 MG PO TABS
100.0000 mg | ORAL_TABLET | Freq: Every day | ORAL | Status: DC
  Administered 2019-12-08 – 2019-12-31 (×23): 100 mg
  Filled 2019-12-08 (×23): qty 2

## 2019-12-08 MED ORDER — QUETIAPINE FUMARATE 50 MG PO TABS
200.0000 mg | ORAL_TABLET | Freq: Two times a day (BID) | ORAL | Status: DC
  Administered 2019-12-08 – 2019-12-15 (×15): 200 mg
  Filled 2019-12-08 (×15): qty 4

## 2019-12-08 NOTE — Progress Notes (Signed)
NAME:  [name], MRN:  [id number], DOB:  02-Rasheed-1957, LOS: 30 ADMISSION DATE:  Nov 24, 2019, CONSULTATION DATE:  11/11/19 REFERRING MD:  Jerral Floyd, CHIEF COMPLAINT:  Dyspnea     Brief History   64 year old man admitted 4/2 with COVID pneumonia and delirium after 1 week of symptoms.  Eventually required intubation 4/5. Proned and paralyzed for 4/5-4/9. Continued to require sedation and paralytics until 4/12 for dyssynchrony.  Delirious on sedation holiday, but able to tolerate some PSV 4/13. Marked increase ventilator requirements with high Ppeak with development of pneumomediastinum and pneumopericardium. Unresponsive to bronchodilators   Bronchoscopy with tube exchange on 4/16 - significant accumulation of secretions within lumen of endotracheal tube.  Significant improvement in airway Ppeak and flow pattern following exchange and aggressive nebulization. Minimal secretions seen.   History of present illness   See above  Past Medical History   Past Medical History:  Diagnosis Date  . Allergic rhinitis   . Arthritis of left acromioclavicular joint 07/25/2014  . Asthma   . Chronic bronchitis (HCC)   . Chronic kidney disease    H/O STONES  . Colon polyp   . Coronary artery disease   . Diabetes mellitus without complication (HCC)   . Diverticula of colon   . GERD (gastroesophageal reflux disease)   . Hx of heart bypass surgery 2005  . Hypercholesteremia   . Impingement syndrome of left shoulder 07/25/2014  . Laceration of brachial artery 03/11/2016   left arm  . Lumbar disc disease   . Partial nontraumatic rupture of right rotator cuff 10/20/2016   Significant Hospital Events   4/19: tachypneic overnight and this am. Unable to wean sedation. Tracheostomy done  4/20: successful trach yesterday but de-recruited and on some escalated settings today. Remains tachypneic this am. Hypoglycemic yesterday and lantus changed 4/22: started on empiric abx but resp cx with abundant gpc.  Improving oxygenation requirement but remains with rass -4 despite light sedation.  4/23: mucous plugging overnight and this am with copious/thick secretions.  4/28: issues overnight with increased vent settings. Still without purposeful responses. Fever overnight as well to 101.1. remains minimally responsive and unable to wean iv sedation except for short periods, despite increase in po agents.  4/29: somewhat improved settings overnight on  60/8. Still with temps overnight to 100.7 5/1: Continued improvement in vent requirement 50/8, remains on several sedating medications including continuous fentanyl and precedex   Consults:  none  Procedures:  04/05 Rt CVC > 4/24 04/16 Bronchoscopy  04/20 Tracheostomy  4/24 Rt PICC  Significant Diagnostic Tests:  4/22 CXR:  1. Lines and tubes stable position. 2. Persistent bibasilar pulmonary infiltrates/edema with interim slight clearing from prior exam. 3. Prior CABG. Heart size stable.  4/24 CXR: Slight increase in interstitial and airspace opacities particularly in the lower lobes. Findings suspicious for worsening asymmetric edema or infection.  Head CT 4/27 Mild chronic ischemic white matter disease. No acute intracranial abnormality seen.  Micro Data:  COVID 4/2 >Positive  Blood cultures 4/2 > negative  Respiratory culture 4/5 > Negative  Respiratory culture 4/12 > Normal flora  Respiratory culture 4/21 > Normal flora   Antimicrobials:  4/21 Vancomycin > 4/23 4/21 Zosyn > 4/26  Interim history/subjective:  No overnight issues. Requiring intermittent catheterization. Trach changed to shiley 6.0 distal xlt yesterday without incident. Became agitated when off all sedation yesterday afternoon. Back on precedex and fentanyl this morning.   Objective   Blood pressure 129/71, pulse (!) 120, temperature 100.1 F (  37.8 C), temperature source Axillary, resp. rate (!) 27, height 5\' 8"  (1.727 m), weight 81.6 kg, SpO2 92 %.    Vent  Mode: PRVC FiO2 (%):  [40 %-60 %] 40 % Set Rate:  [20 bmp] 20 bmp Vt Set:  [480 mL] 480 mL PEEP:  [8 cmH20] 8 cmH20 Plateau Pressure:  [24 cmH20-29 cmH20] 28 cmH20   Intake/Output Summary (Last 24 hours) at 12/08/2019 1216 Last data filed at 12/08/2019 1200 Gross per 24 hour  Intake 2374.68 ml  Output 2325 ml  Net 49.68 ml   Filed Weights   12/06/19 0200 12/07/19 0447 12/08/19 0434  Weight: 81.2 kg 81.5 kg 81.6 kg    Examination: General: Chronically ill appearing elderly male on mechanical ventilation through trach, in NAD HEENT: , MM pink/moist, PERRL,  Neuro: Sedated on vent, unable to follow any commands  CV: s1s2 regular rate and rhythm, no murmur, rubs, or gallops,  PULM:  Trach midline, #6 Shiley cuffed trach in place, Clear to ascultation bilaterally, no added breath sounds, oxygen saturations 95-100 on 50% FiO2 GI: soft, bowel sounds active in all 4 quadrants, non-tender, non-distended, tolerating TF Extremities: warm/dry, Generalized 2+ pitting edema  Skin: no rashes or lesions  Resolved Hospital Problem list   COVID-19 pneumonia -Has completed full course of remdesivir and dexamethasone, off isolation 4/23.   Assessment & Plan:  Acute hypoxic respiratory failure requiring mechanical ventilation due to COVID-19 pneumonia.  S/p treatment for possible bacterial PNA, HCAP -Underwent tracheostomy 4/20 P: Continue ventilator support with lung protective strategies  FiO2 requirement down to 50 continue to wean as able  Head of bed elevated 30 degrees. Plateau pressures less than 30 cm H20.  Follow intermittent chest x-ray and ABG.   SAT/SBT as tolerated, mentation preclude transition to ATC Ensure adequate pulmonary hygiene  VAP bundle in place  PAD protocol Fungitell pending   Asthma hx  Pneumomediastinum, pneumopericardium -S/P second round of steroids P: Continue scheduled and PRN BDs Continue Singular   Toxic metabolic encephalopathy with hypoactive  delirium. P: Continue to wean sedation as able will again discontinue fentanyl drip in favor of PRNs Continue Klonopin abd Quetiapine  Change Trazadone to PRN HS  RASS goal 0 to -1 Continue PO Oxy  Try to minimize sedation as able   t2dm with hyperglycemia -a1c 7.5. Uncontrolled P: SSI Continue Lantus  CBG checks Q4  Urinary retention:  - Patient apparently has a history of urinary retention. Has been getting intermittent straight cath.  P: Continue Doxazosin. Louischarles need foley catheter placed again due to retention.  Best practice:  Diet: TF via NG Pain/Anxiety/Delirium protocol (if indicated): see above VAP protocol (if indicated): ordered DVT prophylaxis: lovenox 0.5mg /kg BID GI prophylaxis: PPI Glucose control: lantus + SSI Mobility: BR Code Status: full Family Communication: updated wife at bedside 5/1 Disposition: ICU   Labs   CBC: Recent Labs  Lab 12/02/19 0447 12/02/19 0447 12/03/19 0421 12/04/19 0624 12/05/19 0530 12/07/19 0559 12/08/19 0325  WBC 8.5   < > 8.6 10.7* 10.8* 11.1* 10.2  NEUTROABS 6.4  --  6.1 8.8* 8.5*  --   --   HGB 9.6*   < > 9.9* 10.1* 9.7* 9.3* 9.5*  HCT 30.3*   < > 31.0* 31.5* 31.3* 30.4* 30.6*  MCV 93.2   < > 93.4 92.1 93.2 93.8 94.2  PLT 133*   < > 164 187 210 240 268   < > = values in this interval not displayed.    Basic  Metabolic Panel: Recent Labs  Lab 12/02/19 0447 12/02/19 0447 12/03/19 0421 12/04/19 0624 12/05/19 0530 12/07/19 0559 12/08/19 0325  NA 137   < > 137 136 138 140 139  K 3.7   < > 4.4 4.1 4.0 4.1 3.7  CL 90*   < > 92* 93* 95* 97* 94*  CO2 39*   < > 38* 36* 36* 35* 37*  GLUCOSE 180*   < > 112* 225* 233* 156* 207*  BUN 19   < > 17 18 21 18 21   CREATININE 0.47*   < > 0.45* 0.40* 0.43* 0.35* 0.40*  CALCIUM 7.8*   < > 7.2* 8.0* 8.0* 8.1* 8.4*  MG 1.8  --  1.8  --  2.0  --   --   PHOS  --   --   --   --  2.9  --   --    < > = values in this interval not displayed.   GFR: Estimated Creatinine Clearance:  89.1 mL/min (A) (by C-G formula based on SCr of 0.4 mg/dL (L)). Recent Labs  Lab 12/04/19 0624 12/05/19 0530 12/07/19 0559 12/08/19 0325  WBC 10.7* 10.8* 11.1* 10.2    Liver Function Tests: Recent Labs  Lab 12/04/19 0624  AST 41  ALT 40  ALKPHOS 70  BILITOT 0.7  PROT 5.4*  ALBUMIN 1.5*   No results for input(s): LIPASE, AMYLASE in the last 168 hours. No results for input(s): AMMONIA in the last 168 hours.  ABG    Component Value Date/Time   PHART 7.448 11/17/2019 0519   PCO2ART 54.0 (H) 11/17/2019 0519   PO2ART 90.0 11/17/2019 0519   HCO3 37.2 (H) 11/17/2019 0519   TCO2 39 (H) 11/17/2019 0519   ACIDBASEDEF 1.0 11/11/2019 1529   O2SAT 97.0 11/17/2019 0519     Coagulation Profile: No results for input(s): INR, PROTIME in the last 168 hours.  Cardiac Enzymes: No results for input(s): CKTOTAL, CKMB, CKMBINDEX, TROPONINI in the last 168 hours.  HbA1C: Hgb A1c MFr Bld  Date/Time Value Ref Range Status  12/02/2019 06:51 PM 7.5 (H) 4.8 - 5.6 % Final    Comment:    (NOTE) Pre diabetes:          5.7%-6.4% Diabetes:              >6.4% Glycemic control for   <7.0% adults with diabetes   12/19/2018 02:21 PM 7.7 (H) 4.6 - 6.5 % Final    Comment:    Glycemic Control Guidelines for People with Diabetes:Non Diabetic:  <6%Goal of Therapy: <7%Additional Action Suggested:  >8%     CBG: Recent Labs  Lab 12/07/19 2023 12/07/19 2347 12/08/19 0324 12/08/19 0800 12/08/19 1159  GLUCAP 252* 218* 185* 178* 195*     Critical care time:   The patient is critically ill with multiple organ systems failure and requires high complexity decision making for assessment and support, frequent evaluation and titration of therapies, application of advanced monitoring technologies and extensive interpretation of multiple databases.   Critical Care Time devoted to patient care services described in this note is 32 minutes. This time reflects time of care of this [place] .  This critical care time does not reflect separately billable procedures or procedure time, teaching time or supervisory time of PA/NP/Med student/Med Resident etc but could involve care discussion time.  [facility] 12/08/2019 12:16 PM  Pager: [phone] After hours pager: [phone]

## 2019-12-09 ENCOUNTER — Inpatient Hospital Stay (HOSPITAL_COMMUNITY): Payer: Medicare Other

## 2019-12-09 DIAGNOSIS — E119 Type 2 diabetes mellitus without complications: Secondary | ICD-10-CM | POA: Diagnosis not present

## 2019-12-09 DIAGNOSIS — U071 COVID-19: Secondary | ICD-10-CM | POA: Diagnosis not present

## 2019-12-09 DIAGNOSIS — G9340 Encephalopathy, unspecified: Secondary | ICD-10-CM | POA: Diagnosis not present

## 2019-12-09 DIAGNOSIS — J9601 Acute respiratory failure with hypoxia: Secondary | ICD-10-CM | POA: Diagnosis not present

## 2019-12-09 LAB — COMPREHENSIVE METABOLIC PANEL
ALT: 17 U/L (ref 0–44)
AST: 20 U/L (ref 15–41)
Albumin: 1.6 g/dL — ABNORMAL LOW (ref 3.5–5.0)
Alkaline Phosphatase: 61 U/L (ref 38–126)
Anion gap: 8 (ref 5–15)
BUN: 20 mg/dL (ref 8–23)
CO2: 37 mmol/L — ABNORMAL HIGH (ref 22–32)
Calcium: 8.4 mg/dL — ABNORMAL LOW (ref 8.9–10.3)
Chloride: 97 mmol/L — ABNORMAL LOW (ref 98–111)
Creatinine, Ser: 0.44 mg/dL — ABNORMAL LOW (ref 0.61–1.24)
GFR calc Af Amer: >60 mL/min (ref >60)
GFR calc non Af Amer: >60 mL/min (ref >60)
Glucose, Bld: 152 mg/dL — ABNORMAL HIGH (ref 70–99)
Potassium: 3.8 mmol/L (ref 3.5–5.1)
Sodium: 142 mmol/L (ref 135–145)
Total Bilirubin: 0.4 mg/dL (ref 0.3–1.2)
Total Protein: 5.7 g/dL — ABNORMAL LOW (ref 6.5–8.1)

## 2019-12-09 LAB — GLUCOSE, CAPILLARY
Glucose-Capillary: 125 mg/dL — ABNORMAL HIGH (ref 70–99)
Glucose-Capillary: 141 mg/dL — ABNORMAL HIGH (ref 70–99)
Glucose-Capillary: 153 mg/dL — ABNORMAL HIGH (ref 70–99)
Glucose-Capillary: 158 mg/dL — ABNORMAL HIGH (ref 70–99)
Glucose-Capillary: 188 mg/dL — ABNORMAL HIGH (ref 70–99)
Glucose-Capillary: 194 mg/dL — ABNORMAL HIGH (ref 70–99)
Glucose-Capillary: 99 mg/dL (ref 70–99)

## 2019-12-09 LAB — CBC
HCT: 28.9 % — ABNORMAL LOW (ref 39.0–52.0)
Hemoglobin: 8.8 g/dL — ABNORMAL LOW (ref 13.0–17.0)
MCH: 29.2 pg (ref 26.0–34.0)
MCHC: 30.4 g/dL (ref 30.0–36.0)
MCV: 96 fL (ref 80.0–100.0)
Platelets: 287 10*3/uL (ref 150–400)
RBC: 3.01 MIL/uL — ABNORMAL LOW (ref 4.22–5.81)
RDW: 15.1 % (ref 11.5–15.5)
WBC: 8.7 10*3/uL (ref 4.0–10.5)
nRBC: 0 % (ref 0.0–0.2)

## 2019-12-09 NOTE — TOC Progression Note (Addendum)
Transition of Care [facility]) - Progression Note    Patient Details  Name: [name] MRN: [id number] Date of Birth: 09/10/1955  [facility] CM/SW Contact  Yeslin Delio, Manfred Arch, RN Phone Number: 12/09/2019, 12:14 PM  Clinical Narrative:   CM received verbal order for [facility] referral.  [facility] can offer bed pending insurance auth.  [facility] to review chart  Update: Pts wife is only interested in [facility].  [facility] will continue to follow and will initate auth once pt is appropriate       Expected Discharge Plan and Services                                                 Social Determinants of Health (SDOH) Interventions    Readmission Risk Interventions No flowsheet data found.

## 2019-12-09 NOTE — Progress Notes (Signed)
EEG complete - results pending 

## 2019-12-09 NOTE — Progress Notes (Signed)
NAME:  Caron Tardif Tabares, MRN:  [id number], DOB:  02-Dec-1955, LOS: 31 ADMISSION DATE:  11/13/2019, CONSULTATION DATE:  11/11/19 REFERRING MD:  Jerral Amanda, CHIEF COMPLAINT:  Dyspnea     Brief History   64 year old man admitted 4/2 with COVID pneumonia and delirium after 1 week of symptoms.  Eventually required intubation 4/5. Proned and paralyzed for 4/5-4/9. Continued to require sedation and paralytics until 4/12 for dyssynchrony.  Delirious on sedation holiday, but able to tolerate some PSV 4/13. Marked increase ventilator requirements with high Ppeak with development of pneumomediastinum and pneumopericardium. Unresponsive to bronchodilators   Bronchoscopy with tube exchange on 4/16 - significant accumulation of secretions within lumen of endotracheal tube.  Significant improvement in airway Ppeak and flow pattern following exchange and aggressive nebulization. Minimal secretions seen.   History of present illness   See above  Past Medical History   Past Medical History:  Diagnosis Date  . Allergic rhinitis   . Arthritis of left acromioclavicular joint 07/25/2014  . Asthma   . Chronic bronchitis (HCC)   . Chronic kidney disease    H/O STONES  . Colon polyp   . Coronary artery disease   . Diabetes mellitus without complication (HCC)   . Diverticula of colon   . GERD (gastroesophageal reflux disease)   . Hx of heart bypass surgery 2005  . Hypercholesteremia   . Impingement syndrome of left shoulder 07/25/2014  . Laceration of brachial artery 03/11/2016   left arm  . Lumbar disc disease   . Partial nontraumatic rupture of right rotator cuff 10/20/2016   Significant Hospital Events   4/19: tachypneic overnight and this am. Unable to wean sedation. Tracheostomy done  4/20: successful trach yesterday but de-recruited and on some escalated settings today. Remains tachypneic this am. Hypoglycemic yesterday and lantus changed 4/22: started on empiric abx but resp cx with abundant gpc.  Improving oxygenation requirement but remains with rass -4 despite light sedation.  4/23: mucous plugging overnight and this am with copious/thick secretions.  4/28: issues overnight with increased vent settings. Still without purposeful responses. Fever overnight as well to 101.1. remains minimally responsive and unable to wean iv sedation except for short periods, despite increase in po agents.  4/29: somewhat improved settings overnight on  60/8. Still with temps overnight to 100.7 5/1: Continued improvement in vent requirement 50/8, remains on several sedating medications including continuous fentanyl and precedex. Trach changed to 6.0 shiley XLT.  Consults:  none  Procedures:  04/05 Rt CVC > 4/24 04/16 Bronchoscopy  04/20 Tracheostomy  4/24 Rt PICC  Significant Diagnostic Tests:  4/22 CXR:  1. Lines and tubes stable position. 2. Persistent bibasilar pulmonary infiltrates/edema with interim slight clearing from prior exam. 3. Prior CABG. Heart size stable.  4/24 CXR: Slight increase in interstitial and airspace opacities particularly in the lower lobes. Findings suspicious for worsening asymmetric edema or infection.  Head CT 4/27 Mild chronic ischemic white matter disease. No acute intracranial abnormality seen.  Micro Data:  COVID 4/2 >Positive  Blood cultures 4/2 > negative  Respiratory culture 4/5 > Negative  Respiratory culture 4/12 > Normal flora  Respiratory culture 4/21 > Normal flora   Antimicrobials:  4/21 Vancomycin > 4/23 4/21 Zosyn > 4/26  Interim history/subjective:  Again unable to tolerate time off sedation due to agitation and risk for self extubation, as well as hypoxemic with hemodynamic instability.   Objective   Blood pressure (!) 111/59, pulse (!) 124, temperature 99.8 F (37.7 C), temperature  source Oral, resp. rate (!) 30, height 5\' 8"  (1.727 m), weight 83.8 kg, SpO2 95 %.    Vent Mode: PRVC FiO2 (%):  [40 %-50 %] 40 % Set Rate:  [20  bmp] 20 bmp Vt Set:  [480 mL] 480 mL PEEP:  [8 cmH20] 8 cmH20 Plateau Pressure:  [24 cmH20-28 cmH20] 24 cmH20   Intake/Output Summary (Last 24 hours) at 12/09/2019 1331 Last data filed at 12/09/2019 1200 Gross per 24 hour  Intake 2593.81 ml  Output 2065 ml  Net 528.81 ml   Filed Weights   12/07/19 0447 12/08/19 0434 12/09/19 0500  Weight: 81.5 kg 81.6 kg 83.8 kg    Examination: General: Chronically ill appearing elderly male on mechanical ventilation through trach, in NAD HEENT: , MM pink/moist, PERRL,  Neuro: Sedated on vent, unable to follow any commands  CV: s1s2 regular rate and rhythm, no murmur, rubs, or gallops,  PULM:  Trach midline, #6 Shiley cuffed trach in place, Clear to ascultation bilaterally, no added breath sounds, oxygen saturations 95-100 on 50% FiO2 GI: soft, bowel sounds active in all 4 quadrants, non-tender, non-distended, tolerating TF Extremities: warm/dry, Generalized 2+ pitting edema  Skin: no rashes or lesions  Resolved Hospital Problem list   COVID-19 pneumonia -Has completed full course of remdesivir and dexamethasone, off isolation 4/23.   Assessment & Plan:  Acute hypoxic respiratory failure requiring mechanical ventilation due to COVID-19 pneumonia.  S/p treatment for possible bacterial PNA, HCAP -Underwent tracheostomy 4/20 P: Continue ventilator support with lung protective strategies  FiO2 requirement down to 50 continue to wean as able. Unable to tolerate TC trials.  Head of bed elevated 30 degrees. Plateau pressures less than 30 cm H20.  Follow intermittent chest x-ray and ABG.   SAT/SBT as tolerated, mentation preclude transition to ATC Ensure adequate pulmonary hygiene  VAP bundle in place  PAD protocol Fungitell pending    Toxic metabolic encephalopathy with hypoactive delirium. P: Continue klonopin, trazadone, RASS goal 0 to -1 Continue PO Oxy  Unable to titrate down sedation - Will order EEG and MRI brain for persistent  encephalopathy   Asthma hx  Pneumomediastinum, pneumopericardium -S/P second round of steroids P: Continue scheduled and PRN BDs Continue Singular   t2dm with hyperglycemia -a1c 7.5. Uncontrolled P: SSI Continue Lantus  CBG checks Q4  Urinary retention:  - Patient apparently has a history of urinary retention. Has been getting intermittent straight cath.  P: Continue Doxazosin. Eardley need foley catheter placed again due to retention.  Best practice:  Diet: TF via NG Pain/Anxiety/Delirium protocol (if indicated): see above VAP protocol (if indicated): ordered DVT prophylaxis: lovenox 0.5mg /kg BID GI prophylaxis: PPI Glucose control: lantus + SSI Mobility: BR Code Status: full Family Communication: updated wife over the phone this morning. Discussed poor prognosis and concern he Prescher end up being ventilator dependent given his prolonged critical illness. She is not sure he would want a quality of life where he can't eat and talk and come home.  Will discuss further in person. Disposition: ICU   Labs   CBC: Recent Labs  Lab 12/03/19 0421 12/03/19 0421 12/04/19 12/06/19 12/05/19 0530 12/07/19 0559 12/08/19 0325 12/09/19 0534  WBC 8.6   < > 10.7* 10.8* 11.1* 10.2 8.7  NEUTROABS 6.1  --  8.8* 8.5*  --   --   --   HGB 9.9*   < > 10.1* 9.7* 9.3* 9.5* 8.8*  HCT 31.0*   < > 31.5* 31.3* 30.4* 30.6* 28.9*  MCV 93.4   < >  92.1 93.2 93.8 94.2 96.0  PLT 164   < > 187 210 240 268 287   < > = values in this interval not displayed.    Basic Metabolic Panel: Recent Labs  Lab 12/03/19 0421 12/03/19 0421 12/04/19 0865 12/05/19 0530 12/07/19 0559 12/08/19 0325 12/09/19 0534  NA 137   < > 136 138 140 139 142  K 4.4   < > 4.1 4.0 4.1 3.7 3.8  CL 92*   < > 93* 95* 97* 94* 97*  CO2 38*   < > 36* 36* 35* 37* 37*  GLUCOSE 112*   < > 225* 233* 156* 207* 152*  BUN 17   < > 18 21 18 21 20   CREATININE 0.45*   < > 0.40* 0.43* 0.35* 0.40* 0.44*  CALCIUM 7.2*   < > 8.0* 8.0* 8.1* 8.4*  8.4*  MG 1.8  --   --  2.0  --   --   --   PHOS  --   --   --  2.9  --   --   --    < > = values in this interval not displayed.   GFR: Estimated Creatinine Clearance: 97.1 mL/min (A) (by C-G formula based on SCr of 0.44 mg/dL (L)). Recent Labs  Lab 12/05/19 0530 12/07/19 0559 12/08/19 0325 12/09/19 0534  WBC 10.8* 11.1* 10.2 8.7    Liver Function Tests: Recent Labs  Lab 12/04/19 0624 12/09/19 0534  AST 41 20  ALT 40 17  ALKPHOS 70 61  BILITOT 0.7 0.4  PROT 5.4* 5.7*  ALBUMIN 1.5* 1.6*   No results for input(s): LIPASE, AMYLASE in the last 168 hours. No results for input(s): AMMONIA in the last 168 hours.  ABG    Component Value Date/Time   PHART 7.448 11/17/2019 0519   PCO2ART 54.0 (H) 11/17/2019 0519   PO2ART 90.0 11/17/2019 0519   HCO3 37.2 (H) 11/17/2019 0519   TCO2 39 (H) 11/17/2019 0519   ACIDBASEDEF 1.0 11/11/2019 1529   O2SAT 97.0 11/17/2019 0519     Coagulation Profile: No results for input(s): INR, PROTIME in the last 168 hours.  Cardiac Enzymes: No results for input(s): CKTOTAL, CKMB, CKMBINDEX, TROPONINI in the last 168 hours.  HbA1C: Hgb A1c MFr Bld  Date/Time Value Ref Range Status  2019/11/17 06:51 PM 7.5 (H) 4.8 - 5.6 % Final    Comment:    (NOTE) Pre diabetes:          5.7%-6.4% Diabetes:              >6.4% Glycemic control for   <7.0% adults with diabetes   12/19/2018 02:21 PM 7.7 (H) 4.6 - 6.5 % Final    Comment:    Glycemic Control Guidelines for People with Diabetes:Non Diabetic:  <6%Goal of Therapy: <7%Additional Action Suggested:  >8%     CBG: Recent Labs  Lab 12/08/19 2025 12/09/19 0034 12/09/19 0434 12/09/19 0830 12/09/19 1139  GLUCAP 190* 188* 125* 141* 158*     Critical care time:   The patient is critically ill with multiple organ systems failure and requires high complexity decision making for assessment and support, frequent evaluation and titration of therapies, application of advanced monitoring technologies  and extensive interpretation of multiple databases.   Critical Care Time devoted to patient care services described in this note is 37 minutes. This time reflects time of care of this signee 02/08/20 . This critical care time does not reflect separately billable procedures  or procedure time, teaching time or supervisory time of PA/NP/Med student/Med Resident etc but could involve care discussion time.  [facility] 12/09/2019 1:31 PM  Pager: [phone] After hours pager: [phone]

## 2019-12-09 NOTE — Progress Notes (Signed)
PT Cancellation Note  Patient Details Name: Criag Wicklund Wenger MRN: [id number] DOB: 21-May-1956   Cancelled Treatment:    Reason Eval/Treat Not Completed: Patient at procedure or test/unavailable; patient in MRI, will attempt again another day.   Elray Mcgregor 12/09/2019, 3:49 PM  Sheran Lawless, PT [facility] [phone] 12/09/2019

## 2019-12-09 NOTE — Progress Notes (Signed)
Pt not available for EEG at this time; going to  MRI; will not be available until after 3PM.

## 2019-12-10 DIAGNOSIS — U071 COVID-19: Secondary | ICD-10-CM | POA: Diagnosis not present

## 2019-12-10 DIAGNOSIS — J9601 Acute respiratory failure with hypoxia: Secondary | ICD-10-CM | POA: Diagnosis not present

## 2019-12-10 DIAGNOSIS — G9340 Encephalopathy, unspecified: Secondary | ICD-10-CM | POA: Diagnosis not present

## 2019-12-10 DIAGNOSIS — Z930 Tracheostomy status: Secondary | ICD-10-CM | POA: Diagnosis not present

## 2019-12-10 LAB — COMPREHENSIVE METABOLIC PANEL
ALT: 17 U/L (ref 0–44)
AST: 18 U/L (ref 15–41)
Albumin: 1.6 g/dL — ABNORMAL LOW (ref 3.5–5.0)
Alkaline Phosphatase: 54 U/L (ref 38–126)
Anion gap: 8 (ref 5–15)
BUN: 23 mg/dL (ref 8–23)
CO2: 35 mmol/L — ABNORMAL HIGH (ref 22–32)
Calcium: 8.3 mg/dL — ABNORMAL LOW (ref 8.9–10.3)
Chloride: 98 mmol/L (ref 98–111)
Creatinine, Ser: 0.45 mg/dL — ABNORMAL LOW (ref 0.61–1.24)
GFR calc Af Amer: >60 mL/min (ref >60)
GFR calc non Af Amer: >60 mL/min (ref >60)
Glucose, Bld: 203 mg/dL — ABNORMAL HIGH (ref 70–99)
Potassium: 3.9 mmol/L (ref 3.5–5.1)
Sodium: 141 mmol/L (ref 135–145)
Total Bilirubin: 0.6 mg/dL (ref 0.3–1.2)
Total Protein: 5.5 g/dL — ABNORMAL LOW (ref 6.5–8.1)

## 2019-12-10 LAB — CBC
HCT: 28.6 % — ABNORMAL LOW (ref 39.0–52.0)
Hemoglobin: 8.6 g/dL — ABNORMAL LOW (ref 13.0–17.0)
MCH: 29.1 pg (ref 26.0–34.0)
MCHC: 30.1 g/dL (ref 30.0–36.0)
MCV: 96.6 fL (ref 80.0–100.0)
Platelets: 283 10*3/uL (ref 150–400)
RBC: 2.96 MIL/uL — ABNORMAL LOW (ref 4.22–5.81)
RDW: 15 % (ref 11.5–15.5)
WBC: 8 10*3/uL (ref 4.0–10.5)
nRBC: 0 % (ref 0.0–0.2)

## 2019-12-10 LAB — GLUCOSE, CAPILLARY
Glucose-Capillary: 148 mg/dL — ABNORMAL HIGH (ref 70–99)
Glucose-Capillary: 157 mg/dL — ABNORMAL HIGH (ref 70–99)
Glucose-Capillary: 166 mg/dL — ABNORMAL HIGH (ref 70–99)
Glucose-Capillary: 172 mg/dL — ABNORMAL HIGH (ref 70–99)
Glucose-Capillary: 192 mg/dL — ABNORMAL HIGH (ref 70–99)
Glucose-Capillary: 201 mg/dL — ABNORMAL HIGH (ref 70–99)

## 2019-12-10 MED ORDER — FENTANYL BOLUS VIA INFUSION
50.0000 ug | INTRAVENOUS | Status: DC | PRN
  Administered 2019-12-10 – 2019-12-12 (×7): 50 ug via INTRAVENOUS
  Administered 2019-12-13: 100 ug via INTRAVENOUS
  Administered 2019-12-13 – 2019-12-16 (×10): 50 ug via INTRAVENOUS
  Filled 2019-12-10: qty 50

## 2019-12-10 MED ORDER — DEXMEDETOMIDINE HCL IN NACL 400 MCG/100ML IV SOLN
0.0000 ug/kg/h | INTRAVENOUS | Status: DC
  Administered 2019-12-10 – 2019-12-11 (×4): 0.8 ug/kg/h via INTRAVENOUS
  Administered 2019-12-12: 1 ug/kg/h via INTRAVENOUS
  Administered 2019-12-12: 0.8 ug/kg/h via INTRAVENOUS
  Administered 2019-12-12: 0.9 ug/kg/h via INTRAVENOUS
  Administered 2019-12-12: 0.4 ug/kg/h via INTRAVENOUS
  Administered 2019-12-13 (×2): 0.6 ug/kg/h via INTRAVENOUS
  Administered 2019-12-13: 0.9 ug/kg/h via INTRAVENOUS
  Administered 2019-12-14: 0.6 ug/kg/h via INTRAVENOUS
  Administered 2019-12-14: 0.5 ug/kg/h via INTRAVENOUS
  Administered 2019-12-14: 0.8 ug/kg/h via INTRAVENOUS
  Administered 2019-12-15: 1 ug/kg/h via INTRAVENOUS
  Administered 2019-12-15: 0.8 ug/kg/h via INTRAVENOUS
  Administered 2019-12-15 (×2): 1 ug/kg/h via INTRAVENOUS
  Administered 2019-12-16: 0.7 ug/kg/h via INTRAVENOUS
  Administered 2019-12-16: 0.8 ug/kg/h via INTRAVENOUS
  Administered 2019-12-16: 1 ug/kg/h via INTRAVENOUS
  Administered 2019-12-16: 0.8 ug/kg/h via INTRAVENOUS
  Administered 2019-12-17: 1.202 ug/kg/h via INTRAVENOUS
  Administered 2019-12-17 (×4): 1.2 ug/kg/h via INTRAVENOUS
  Administered 2019-12-18: 1.4 ug/kg/h via INTRAVENOUS
  Administered 2019-12-18 (×2): 1.2 ug/kg/h via INTRAVENOUS
  Administered 2019-12-18 – 2019-12-19 (×3): 0.8 ug/kg/h via INTRAVENOUS
  Administered 2019-12-19 – 2019-12-20 (×3): 0.7 ug/kg/h via INTRAVENOUS
  Administered 2019-12-20: 1 ug/kg/h via INTRAVENOUS
  Administered 2019-12-20: 0.5 ug/kg/h via INTRAVENOUS
  Administered 2019-12-21 (×2): 0.4 ug/kg/h via INTRAVENOUS
  Filled 2019-12-10 (×42): qty 100

## 2019-12-10 MED ORDER — INSULIN ASPART 100 UNIT/ML ~~LOC~~ SOLN
6.0000 [IU] | SUBCUTANEOUS | Status: DC
  Administered 2019-12-10 – 2020-01-07 (×146): 6 [IU] via SUBCUTANEOUS

## 2019-12-10 NOTE — Progress Notes (Signed)
PT Cancellation Note  Patient Details Name: [name] MRN: [id number] DOB: [birth year]   Cancelled Treatment:    Reason Eval/Treat Not Completed: Patient not medically ready.  RN stated pt unable to participate today.  Pt sedated on vent. 12/10/2019  Jacinto Halim., PT [facility] [phone]  (pager) [phone]  (office)   [name] 12/10/2019, 4:22 PM

## 2019-12-10 NOTE — Procedures (Signed)
Patient Name: [name]  MRN: [id number]  Epilepsy Attending: Charlsie Quest  Referring Physician/Provider: Dr. Durel Salts Date: 12/09/2019 Duration: 24.34 minutes  Patient history: 63 year old male with altered mental status.  EEG evaluate for seizures.  Level of alertness: Comatose  AEDs during EEG study: Klonopin gabapentin  Technical aspects: This EEG study was done with scalp electrodes positioned according to the 10-20 International system of electrode placement. Electrical activity was acquired at a sampling rate of 500Hz  and reviewed with a high frequency filter of 70Hz  and a low frequency filter of 1Hz . EEG data were recorded continuously and digitally stored.   Description: EEG showed continuous generalize 3 to 5 Hz theta-delta slowing admixed with 9 to 10 Hz generalized alpha activity.  EEG was reactive to tactile stimulation.  Hyperventilation and photic stimulation were not performed.  Abnormality -Continuous slow, generalized  IMPRESSION: This study is suggestive of severe diffuse encephalopathy, nonspecific to etiology. No seizures or epileptiform discharges were seen throughout the recording.      [name] 

## 2019-12-10 NOTE — Progress Notes (Addendum)
NAME:  [name], MRN:  [id number], DOB:  1955-09-20, LOS: 48 ADMISSION DATE:  26-Nov-2019, CONSULTATION DATE:  11/11/19 REFERRING MD:  Sloan Leiter, CHIEF COMPLAINT:  Dyspnea     Brief History   64 year old man admitted 4/2 with COVID pneumonia and delirium after 1 week of symptoms.  Eventually required intubation 4/5. Proned and paralyzed for 4/5-4/9. Continued to require sedation and paralytics until 4/12 for dyssynchrony.  Delirious on sedation holiday, but able to tolerate some PSV 4/13. Marked increase ventilator requirements with high Ppeak with development of pneumomediastinum and pneumopericardium. Unresponsive to bronchodilators   Bronchoscopy with tube exchange on 4/16 - significant accumulation of secretions within lumen of endotracheal tube.  Significant improvement in airway Ppeak and flow pattern following exchange and aggressive nebulization. Minimal secretions seen.   History of present illness   See above  Past Medical History   Past Medical History:  Diagnosis Date  . Allergic rhinitis   . Arthritis of left acromioclavicular joint 07/25/2014  . Asthma   . Chronic bronchitis ([place])   . Chronic kidney disease    H/O STONES  . Colon polyp   . Coronary artery disease   . Diabetes mellitus without complication ([place])   . Diverticula of colon   . GERD (gastroesophageal reflux disease)   . Hx of heart bypass surgery 2005  . Hypercholesteremia   . Impingement syndrome of left shoulder 07/25/2014  . Laceration of brachial artery 03/11/2016   left arm  . Lumbar disc disease   . Partial nontraumatic rupture of right rotator cuff 10/20/2016   Significant Hospital Events   4/19: tachypneic overnight and this am. Unable to wean sedation. Tracheostomy done  4/20: successful trach yesterday but de-recruited and on some escalated settings today. Remains tachypneic this am. Hypoglycemic yesterday and lantus changed 4/22: started on empiric abx but resp cx with abundant gpc.  Improving oxygenation requirement but remains with rass -4 despite light sedation.  4/23: mucous plugging overnight and this am with copious/thick secretions.  4/28: issues overnight with increased vent settings. Still without purposeful responses. Fever overnight as well to 101.1. remains minimally responsive and unable to wean iv sedation except for short periods, despite increase in po agents.  4/29: somewhat improved settings overnight on  60/8. Still with temps overnight to 100.7 5/1: Continued improvement in vent requirement 50/8, remains on several sedating medications including continuous fentanyl and precedex. Trach changed to 6.0 shiley XLT.  Consults:  none  Procedures:  04/05 Rt CVC > 4/24 04/16 Bronchoscopy  04/20 Tracheostomy  4/24 Rt PICC  Significant Diagnostic Tests:  4/22 CXR:  1. Lines and tubes stable position. 2. Persistent bibasilar pulmonary infiltrates/edema with interim slight clearing from prior exam. 3. Prior CABG. Heart size stable.  4/24 CXR: Slight increase in interstitial and airspace opacities particularly in the lower lobes. Findings suspicious for worsening asymmetric edema or infection.  Head CT 4/27 Mild chronic ischemic white matter disease. No acute intracranial abnormality seen.  Micro Data:  COVID 4/2 >Positive  Blood cultures 4/2 > negative  Respiratory culture 4/5 > Negative  Respiratory culture 4/12 > Normal flora  Respiratory culture 4/21 > Normal flora   Antimicrobials:  4/21 Vancomycin > 4/23 4/21 Zosyn > 4/26  Interim history/subjective:  PS trial this morning. Not responsive. Still with low grade fevers.  Objective   Blood pressure 111/64, pulse (!) 109, temperature (!) 100.8 F (38.2 C), temperature source Axillary, resp. rate (!) 26, height 5\' 8"  (1.727 m), weight  81.8 kg, SpO2 92 %.    Vent Mode: PRVC FiO2 (%):  [40 %] 40 % Set Rate:  [20 bmp] 20 bmp Vt Set:  [480 mL] 480 mL PEEP:  [5 cmH20-8 cmH20] 5  cmH20 Pressure Support:  [8 cmH20] 8 cmH20 Plateau Pressure:  [23 cmH20-31 cmH20] 23 cmH20   Intake/Output Summary (Last 24 hours) at 12/10/2019 1147 Last data filed at 12/10/2019 1100 Gross per 24 hour  Intake 2621.62 ml  Output 1710 ml  Net 911.62 ml   Filed Weights   12/08/19 0434 12/09/19 0500 12/10/19 0403  Weight: 81.6 kg 83.8 kg 81.8 kg    Examination: General: Chronically ill appearing elderly male on mechanical ventilation through trach, in NAD HEENT: , MM pink/moist, PERRL,  Neuro: Sedated on vent, unable to follow any commands  CV: s1s2 regular rate and rhythm, no murmur, rubs, or gallops,  PULM:  Trach midline, #6 Shiley cuffed trach in place, Clear to ascultation bilaterally, no added breath sounds, oxygen saturations 95-100 on 50% FiO2 GI: soft, bowel sounds active in all 4 quadrants, non-tender, non-distended, tolerating TF Extremities: warm/dry, Generalized 2+ pitting edema  Skin: no rashes or lesions  Resolved Hospital Problem list   COVID-19 pneumonia -Has completed full course of remdesivir and dexamethasone, off isolation 4/23.   Assessment & Plan:  Acute hypoxic respiratory failure requiring mechanical ventilation due to COVID-19 pneumonia.  S/p treatment for possible bacterial PNA, HCAP -Underwent tracheostomy 4/20 P: Continue ventilator support with lung protective strategies  Had brief PS trial this am.  Head of bed elevated 30 degrees. Plateau pressures less than 30 cm H20.  Follow intermittent chest x-ray and ABG.   SAT/SBT as tolerated, mentation preclude transition to ATC Ensure adequate pulmonary hygiene  VAP bundle in place  PAD protocol Fungitell pending    Toxic metabolic encephalopathy with hypoactive delirium. P: Continue klonopin, trazadone, seroquel RASS goal 0 to -1 Continue PO Oxy  Unable to titrate down sedation. Now needing more prns like fentanyl - MRI shows chronic ischemic changes, EEG no seizures just diffuse slowing.    Asthma hx  Pneumomediastinum, pneumopericardium -S/P second round of steroids P: Continue scheduled and PRN BDs Continue Singular   t2dm with hyperglycemia -a1c 7.5. Uncontrolled P: SSI. Increasing TF coverage. Continue Lantus  CBG checks Q4  Urinary retention:  - Patient has a history of urinary retention. Has been getting intermittent straight cath.  P: Continue Doxazosin. Foley for retention  Best practice:  Diet: TF via NG Pain/Anxiety/Delirium protocol (if indicated): see above VAP protocol (if indicated): ordered DVT prophylaxis: lovenox 0.5mg /kg BID GI prophylaxis: PPI Glucose control: lantus + SSI Mobility: BR Code Status: full Family Communication: updated wife over the phone on 5/3.  I had a long conversation with the patient's wife regarding his poor prognosis and failure to meaningful improve over the last 32 days. We discussed the increasingly likelihood that he will not be able to come home, eat, walk or talk on his own. She knows he would not want this and would likely transition him to comfort measures, but she needs to talk to his family first. We will talk again tomorrow. She is not ready to make the decision yet.  Disposition: ICU   Labs   CBC: Recent Labs  Lab 12/04/19 0624 12/04/19 0624 12/05/19 0530 12/07/19 0559 12/08/19 0325 12/09/19 0534 12/10/19 0541  WBC 10.7*   < > 10.8* 11.1* 10.2 8.7 8.0  NEUTROABS 8.8*  --  8.5*  --   --   --   --  HGB 10.1*   < > 9.7* 9.3* 9.5* 8.8* 8.6*  HCT 31.5*   < > 31.3* 30.4* 30.6* 28.9* 28.6*  MCV 92.1   < > 93.2 93.8 94.2 96.0 96.6  PLT 187   < > 210 240 268 287 283   < > = values in this interval not displayed.    Basic Metabolic Panel: Recent Labs  Lab 12/05/19 0530 12/07/19 0559 12/08/19 0325 12/09/19 0534 12/10/19 0541  NA 138 140 139 142 141  K 4.0 4.1 3.7 3.8 3.9  CL 95* 97* 94* 97* 98  CO2 36* 35* 37* 37* 35*  GLUCOSE 233* 156* 207* 152* 203*  BUN 21 18 21 20 23   CREATININE 0.43*  0.35* 0.40* 0.44* 0.45*  CALCIUM 8.0* 8.1* 8.4* 8.4* 8.3*  MG 2.0  --   --   --   --   PHOS 2.9  --   --   --   --    GFR: Estimated Creatinine Clearance: 89.1 mL/min (A) (by C-G formula based on SCr of 0.45 mg/dL (L)). Recent Labs  Lab 12/07/19 0559 12/08/19 0325 12/09/19 0534 12/10/19 0541  WBC 11.1* 10.2 8.7 8.0    Liver Function Tests: Recent Labs  Lab 12/04/19 0624 12/09/19 0534 12/10/19 0541  AST 41 20 18  ALT 40 17 17  ALKPHOS 70 61 54  BILITOT 0.7 0.4 0.6  PROT 5.4* 5.7* 5.5*  ALBUMIN 1.5* 1.6* 1.6*   No results for input(s): LIPASE, AMYLASE in the last 168 hours. No results for input(s): AMMONIA in the last 168 hours.  ABG    Component Value Date/Time   PHART 7.448 11/17/2019 0519   PCO2ART 54.0 (H) 11/17/2019 0519   PO2ART 90.0 11/17/2019 0519   HCO3 37.2 (H) 11/17/2019 0519   TCO2 39 (H) 11/17/2019 0519   ACIDBASEDEF 1.0 11/11/2019 1529   O2SAT 97.0 11/17/2019 0519     Coagulation Profile: No results for input(s): INR, PROTIME in the last 168 hours.  Cardiac Enzymes: No results for input(s): CKTOTAL, CKMB, CKMBINDEX, TROPONINI in the last 168 hours.  HbA1C: Hgb A1c MFr Bld  Date/Time Value Ref Range Status  11/30/2019 06:51 PM 7.5 (H) 4.8 - 5.6 % Final    Comment:    (NOTE) Pre diabetes:          5.7%-6.4% Diabetes:              >6.4% Glycemic control for   <7.0% adults with diabetes   12/19/2018 02:21 PM 7.7 (H) 4.6 - 6.5 % Final    Comment:    Glycemic Control Guidelines for People with Diabetes:Non Diabetic:  <6%Goal of Therapy: <7%Additional Action Suggested:  >8%     CBG: Recent Labs  Lab 12/09/19 1611 12/09/19 2015 12/09/19 2344 12/10/19 0338 12/10/19 0754  GLUCAP 99 153* 194* 192* 201*     Critical care time:   The patient is critically ill with multiple organ systems failure and requires high complexity decision making for assessment and support, frequent evaluation and titration of therapies, application of advanced  monitoring technologies and extensive interpretation of multiple databases.   Critical Care Time devoted to patient care services described in this note is 35 minutes. This time reflects time of care of this signee 02/09/20 . This critical care time does not reflect separately billable procedures or procedure time, teaching time or supervisory time of PA/NP/Med student/Med Resident etc but could involve care discussion time.  Charlott [facility] 12/10/2019 11:47 AM  Pager: [phone] After hours pager: [phone]

## 2019-12-11 DIAGNOSIS — Z930 Tracheostomy status: Secondary | ICD-10-CM | POA: Diagnosis not present

## 2019-12-11 DIAGNOSIS — J9601 Acute respiratory failure with hypoxia: Secondary | ICD-10-CM | POA: Diagnosis not present

## 2019-12-11 DIAGNOSIS — G9340 Encephalopathy, unspecified: Secondary | ICD-10-CM | POA: Diagnosis not present

## 2019-12-11 DIAGNOSIS — U071 COVID-19: Secondary | ICD-10-CM | POA: Diagnosis not present

## 2019-12-11 LAB — BASIC METABOLIC PANEL
Anion gap: 8 (ref 5–15)
BUN: 21 mg/dL (ref 8–23)
CO2: 34 mmol/L — ABNORMAL HIGH (ref 22–32)
Calcium: 8.1 mg/dL — ABNORMAL LOW (ref 8.9–10.3)
Chloride: 98 mmol/L (ref 98–111)
Creatinine, Ser: 0.36 mg/dL — ABNORMAL LOW (ref 0.61–1.24)
GFR calc Af Amer: >60 mL/min (ref >60)
GFR calc non Af Amer: >60 mL/min (ref >60)
Glucose, Bld: 171 mg/dL — ABNORMAL HIGH (ref 70–99)
Potassium: 4.1 mmol/L (ref 3.5–5.1)
Sodium: 140 mmol/L (ref 135–145)

## 2019-12-11 LAB — CBC
HCT: 26.7 % — ABNORMAL LOW (ref 39.0–52.0)
Hemoglobin: 8.3 g/dL — ABNORMAL LOW (ref 13.0–17.0)
MCH: 29.6 pg (ref 26.0–34.0)
MCHC: 31.1 g/dL (ref 30.0–36.0)
MCV: 95.4 fL (ref 80.0–100.0)
Platelets: 272 10*3/uL (ref 150–400)
RBC: 2.8 MIL/uL — ABNORMAL LOW (ref 4.22–5.81)
RDW: 14.7 % (ref 11.5–15.5)
WBC: 7.5 10*3/uL (ref 4.0–10.5)
nRBC: 0 % (ref 0.0–0.2)

## 2019-12-11 LAB — GLUCOSE, CAPILLARY
Glucose-Capillary: 155 mg/dL — ABNORMAL HIGH (ref 70–99)
Glucose-Capillary: 138 mg/dL — ABNORMAL HIGH (ref 70–99)
Glucose-Capillary: 157 mg/dL — ABNORMAL HIGH (ref 70–99)
Glucose-Capillary: 118 mg/dL — ABNORMAL HIGH (ref 70–99)
Glucose-Capillary: 153 mg/dL — ABNORMAL HIGH (ref 70–99)
Glucose-Capillary: 126 mg/dL — ABNORMAL HIGH (ref 70–99)

## 2019-12-11 MED ORDER — INSULIN GLARGINE 100 UNIT/ML ~~LOC~~ SOLN
14.0000 [IU] | Freq: Two times a day (BID) | SUBCUTANEOUS | Status: DC
  Administered 2019-12-11 – 2019-12-30 (×38): 14 [IU] via SUBCUTANEOUS
  Filled 2019-12-11 (×42): qty 0.14

## 2019-12-11 NOTE — Progress Notes (Signed)
NAME:  [name], MRN:  [id number], DOB:  [birth year], LOS: 33 ADMISSION DATE:  12/05/2019, CONSULTATION DATE:  11/11/19 REFERRING MD:  Jerral Dyan, CHIEF COMPLAINT:  Dyspnea     Brief History   64 year old man admitted 4/2 with COVID pneumonia and delirium after 1 week of symptoms.  Eventually required intubation 4/5. Proned and paralyzed for 4/5-4/9. Continued to require sedation and paralytics until 4/12 for dyssynchrony.  Delirious on sedation holiday, but able to tolerate some PSV 4/13. Marked increase ventilator requirements with high Ppeak with development of pneumomediastinum and pneumopericardium. Unresponsive to bronchodilators   Bronchoscopy with tube exchange on 4/16 - significant accumulation of secretions within lumen of endotracheal tube.  Significant improvement in airway Ppeak and flow pattern following exchange and aggressive nebulization. Minimal secretions seen.   History of present illness   See above  Past Medical History   Past Medical History:  Diagnosis Date  . Allergic rhinitis   . Arthritis of left acromioclavicular joint 07/25/2014  . Asthma   . Chronic bronchitis (HCC)   . Chronic kidney disease    H/O STONES  . Colon polyp   . Coronary artery disease   . Diabetes mellitus without complication (HCC)   . Diverticula of colon   . GERD (gastroesophageal reflux disease)   . Hx of heart bypass surgery 2005  . Hypercholesteremia   . Impingement syndrome of left shoulder 07/25/2014  . Laceration of brachial artery 03/11/2016   left arm  . Lumbar disc disease   . Partial nontraumatic rupture of right rotator cuff 10/20/2016   Significant Hospital Events   4/19: tachypneic overnight and this am. Unable to wean sedation. Tracheostomy done  4/20: successful trach yesterday but de-recruited and on some escalated settings today. Remains tachypneic this am. Hypoglycemic yesterday and lantus changed 4/22: started on empiric abx but resp cx with abundant gpc.  Improving oxygenation requirement but remains with rass -4 despite light sedation.  4/23: mucous plugging overnight and this am with copious/thick secretions.  4/28: issues overnight with increased vent settings. Still without purposeful responses. Fever overnight as well to 101.1. remains minimally responsive and unable to wean iv sedation except for short periods, despite increase in po agents.  4/29: somewhat improved settings overnight on  60/8. Still with temps overnight to 100.7 5/1: Continued improvement in vent requirement 50/8, remains on several sedating medications including continuous fentanyl and precedex. Trach changed to 6.0 shiley XLT.  Consults:  none  Procedures:  04/05 Rt CVC > 4/24 04/16 Bronchoscopy  04/20 Tracheostomy  4/24 Rt PICC  Significant Diagnostic Tests:  4/22 CXR:  1. Lines and tubes stable position. 2. Persistent bibasilar pulmonary infiltrates/edema with interim slight clearing from prior exam. 3. Prior CABG. Heart size stable.  4/24 CXR: Slight increase in interstitial and airspace opacities particularly in the lower lobes. Findings suspicious for worsening asymmetric edema or infection.  Head CT 4/27 Mild chronic ischemic white matter disease. No acute intracranial abnormality seen.  Micro Data:  COVID 4/2 >Positive  Blood cultures 4/2 > negative  Respiratory culture 4/5 > Negative  Respiratory culture 4/12 > Normal flora  Respiratory culture 4/21 > Normal flora   Antimicrobials:  4/21 Vancomycin > 4/23 4/21 Zosyn > 4/26  Interim history/subjective:  No overnight issues. Desaturates easily with holding sedation.   Objective   Blood pressure (!) 108/58, pulse 86, temperature 98.4 F (36.9 C), temperature source Axillary, resp. rate (!) 24, height 5\' 8"  (1.727 m), weight 83.3 kg, SpO2  96 %.    Vent Mode: PRVC FiO2 (%):  [40 %] 40 % Set Rate:  [20 bmp] 20 bmp Vt Set:  [480 mL] 480 mL PEEP:  [8 cmH20] 8 cmH20 Pressure Support:  [8  cmH20] 8 cmH20 Plateau Pressure:  [21 HLK56-25 cmH20] 27 cmH20   Intake/Output Summary (Last 24 hours) at 12/11/2019 0819 Last data filed at 12/11/2019 0600 Gross per 24 hour  Intake 3066.12 ml  Output 1180 ml  Net 1886.12 ml   Filed Weights   12/09/19 0500 12/10/19 0403 12/11/19 0100  Weight: 83.8 kg 81.8 kg 83.3 kg    Examination: General: Chronically ill appearing elderly male on mechanical ventilation through trach, in NAD HEENT: , MM pink/moist, PERRL,  Neuro: Sedated on vent, not resposnive CV: s1s2 regular rate and rhythm, no murmur, rubs, or gallops,  PULM: #6 Shiley distal XLT cuffed trach in place, no wheezes GI: soft, bowel sounds active in all 4 quadrants, non-tender, non-distended, tolerating TF Extremities: warm/dry, Generalized 2+ pitting edema  Skin: no rashes or lesions  Resolved Hospital Problem list   COVID-19 pneumonia -Has completed full course of remdesivir and dexamethasone, off isolation 4/23.   Assessment & Plan:  Acute hypoxic respiratory failure requiring mechanical ventilation due to COVID-19 pneumonia.  S/p treatment for possible bacterial PNA, HCAP -Underwent tracheostomy 4/20 P: Continue ventilator support with lung protective strategies  Had brief PS trial this am.  Head of bed elevated 30 degrees. Plateau pressures less than 30 cm H20.  Follow intermittent chest x-ray and ABG.   Mentation preclude transition to ATC Ensure adequate pulmonary hygiene  VAP bundle in place  PAD protocol Fungitell pending    Toxic metabolic encephalopathy with hypoactive delirium. P: Continue klonopin, trazadone, seroquel, oxycodone RASS goal 0 to -1.  Unable to titrate down sedation. Now needing more prns like fentanyl - MRI shows chronic ischemic changes, EEG no seizures just diffuse slowing.   Asthma hx  Pneumomediastinum, pneumopericardium -S/P second round of steroids P: Continue scheduled and PRN BDs Continue Singular   t2dm with  hyperglycemia -a1c 7.5. Uncontrolled P: SSI. Increasing TF coverage. Continue Lantus  CBG checks Q4  Urinary retention:  - Patient has a history of urinary retention. Has been getting intermittent straight cath.  P: Continue Doxazosin. Foley for retention  Best practice:  Diet: TF via NG Pain/Anxiety/Delirium protocol (if indicated): see above VAP protocol (if indicated): ordered DVT prophylaxis: lovenox 0.5mg /kg BID GI prophylaxis: PPI Glucose control: lantus + SSI Mobility: BR Code Status: full Family Communication:will talk with wife again today. Yesterday she was discussing with family goals of care. Patient has a son. She knows "what decision she has to make" but she isn't ready quite yet until talking with family. She says "he would not want to live like this." Disposition: ICU   Labs   CBC: Recent Labs  Lab 12/05/19 0530 12/05/19 0530 12/07/19 0559 12/08/19 0325 12/09/19 0534 12/10/19 0541 12/11/19 0356  WBC 10.8*   < > 11.1* 10.2 8.7 8.0 7.5  NEUTROABS 8.5*  --   --   --   --   --   --   HGB 9.7*   < > 9.3* 9.5* 8.8* 8.6* 8.3*  HCT 31.3*   < > 30.4* 30.6* 28.9* 28.6* 26.7*  MCV 93.2   < > 93.8 94.2 96.0 96.6 95.4  PLT 210   < > 240 268 287 283 272   < > = values in this interval not displayed.  Basic Metabolic Panel: Recent Labs  Lab 12/05/19 0530 12/05/19 0530 12/07/19 0559 12/08/19 0325 12/09/19 0534 12/10/19 0541 12/11/19 0356  NA 138   < > 140 139 142 141 140  K 4.0   < > 4.1 3.7 3.8 3.9 4.1  CL 95*   < > 97* 94* 97* 98 98  CO2 36*   < > 35* 37* 37* 35* 34*  GLUCOSE 233*   < > 156* 207* 152* 203* 171*  BUN 21   < > 18 21 20 23 21   CREATININE 0.43*   < > 0.35* 0.40* 0.44* 0.45* 0.36*  CALCIUM 8.0*   < > 8.1* 8.4* 8.4* 8.3* 8.1*  MG 2.0  --   --   --   --   --   --   PHOS 2.9  --   --   --   --   --   --    < > = values in this interval not displayed.   GFR: Estimated Creatinine Clearance: 96.9 mL/min (A) (by C-G formula based on SCr of  0.36 mg/dL (L)). Recent Labs  Lab 12/08/19 0325 12/09/19 0534 12/10/19 0541 12/11/19 0356  WBC 10.2 8.7 8.0 7.5    Liver Function Tests: Recent Labs  Lab 12/09/19 0534 12/10/19 0541  AST 20 18  ALT 17 17  ALKPHOS 61 54  BILITOT 0.4 0.6  PROT 5.7* 5.5*  ALBUMIN 1.6* 1.6*   No results for input(s): LIPASE, AMYLASE in the last 168 hours. No results for input(s): AMMONIA in the last 168 hours.  ABG    Component Value Date/Time   PHART 7.448 11/17/2019 0519   PCO2ART 54.0 (H) 11/17/2019 0519   PO2ART 90.0 11/17/2019 0519   HCO3 37.2 (H) 11/17/2019 0519   TCO2 39 (H) 11/17/2019 0519   ACIDBASEDEF 1.0 11/11/2019 1529   O2SAT 97.0 11/17/2019 0519     Coagulation Profile: No results for input(s): INR, PROTIME in the last 168 hours.  Cardiac Enzymes: No results for input(s): CKTOTAL, CKMB, CKMBINDEX, TROPONINI in the last 168 hours.  HbA1C: Hgb A1c MFr Bld  Date/Time Value Ref Range Status  11/16/2019 06:51 PM 7.5 (H) 4.8 - 5.6 % Final    Comment:    (NOTE) Pre diabetes:          5.7%-6.4% Diabetes:              >6.4% Glycemic control for   <7.0% adults with diabetes   12/19/2018 02:21 PM 7.7 (H) 4.6 - 6.5 % Final    Comment:    Glycemic Control Guidelines for People with Diabetes:Non Diabetic:  <6%Goal of Therapy: <7%Additional Action Suggested:  >8%     CBG: Recent Labs  Lab 12/10/19 1609 12/10/19 1933 12/10/19 2344 12/11/19 0341 12/11/19 0738  GLUCAP 157* 148* 172* 155* 138*     Critical care time:   The patient is critically ill with multiple organ systems failure and requires high complexity decision making for assessment and support, frequent evaluation and titration of therapies, application of advanced monitoring technologies and extensive interpretation of multiple databases.   Critical Care Time devoted to patient care services described in this note is 39 minutes. This time reflects time of care of this signee 02/10/20 . This critical  care time does not reflect separately billable procedures or procedure time, teaching time or supervisory time of PA/NP/Med student/Med Resident etc but could involve care discussion time.  Charlott Holler [facility]  12/11/2019 8:19 AM  Pager: [phone] After hours pager: [phone]

## 2019-12-11 NOTE — Progress Notes (Signed)
Nutrition Follow-up  DOCUMENTATION CODES:   Not applicable  INTERVENTION:   Continue Tube Feeding:  Vital AF 1.2 at 65 ml/hr Pro-Stat 30 mL daily Free water flushes 100 ml every 4 hours  Provides 1972 kcals, 132 g of protein and 1864 mL of free water total.  NUTRITION DIAGNOSIS:   Inadequate oral intake related to inability to eat, acute illness as evidenced by NPO status.  Ongoing  GOAL:   Patient will meet greater than or equal to 90% of their needs  Met with TF  MONITOR:   Vent status, Labs, Weight trends, TF tolerance  REASON FOR ASSESSMENT:   Ventilator, Consult Enteral/tube feeding initiation and management  ASSESSMENT:   64 yo male with ARDS secondary to COVID-19 pnuemonia requiring intubation, acute encephalopathy with hx of heavy EtOH abuse, AKI. PMH includes DM, HTN, CAD s/p PCI/CABG  4/02 Admitted 4/05 Intubated, Paralyzed, Prone 4/06 TF initiated via OG tube 4/07 Cortrak, tip in the stomach 4/09 Proning and paralyzation stopped 4/19 Trach placed                                            Family is discussing overall goals of care.  Currently receiving TF via Cortrak: Vital AF 1.2 at 65 ml/h with Pro-stat 30 ml once daily.  Free water 100 ml every 4 hours.   Patient remains on ventilator support MV: 10.1 L/min Temp (24hrs), Avg:99.2 F (37.3 C), Min:98.4 F (36.9 C), Max:100.6 F (38.1 C)   Labs reviewed. CBGs [phone] Medications reviewed and include ss novolog, lantus, tradjenta, MVI with minerals, thiamine.  I/O -843 ml since admission. UOP 1355 ml x 24 hours Weight fairly stable for the past week.  Diet Order:   Diet Order            Diet NPO time specified  Diet effective now              EDUCATION NEEDS:   Not appropriate for education at this time  Skin:  Skin Assessment: Skin Integrity Issues: Skin Integrity Issues:: DTI DTI: coccyx  Last BM:  5/4 (rectal tube)  Height:   Ht Readings from Last 1  Encounters:  11/29/19 '5\' 8"'  (1.727 m)    Weight:   Wt Readings from Last 1 Encounters:  12/11/19 83.3 kg    BMI:  Body mass index is 27.92 kg/m.  Estimated Nutritional Needs:   Kcal:  1950-2270 kcals  Protein:  120-145 g  Fluid:  >/= 2 L   Lucas Mallow, RD, LDN, CNSC Please refer to Amion for contact information.

## 2019-12-11 NOTE — Progress Notes (Signed)
eLink Physician-Brief Progress Note Patient Name: [name] DOB: 06-18-1956 MRN: [id number]   Date of Service  12/11/2019  HPI/Events of Note  No AM lab orders.   eICU Interventions  Will order: 1. CBC and BMP in AM.     Intervention Category Major Interventions: Other:  Lenell Antu 12/11/2019, 3:46 AM

## 2019-12-11 NOTE — Progress Notes (Signed)
PT Cancellation Note  Patient Details Name: [name] MRN: [id number] DOB: 1955/12/31   Cancelled Treatment:    Reason Eval/Treat Not Completed: Patient not medically ready.  Pt K[place] move to comfort care soon.  Will see as able and when approp. 12/11/2019  Jacinto Halim., PT Acute Rehabilitation Services [phone]  (pager) [phone]  (office)   [name] 12/11/2019, 10:24 AM

## 2019-12-12 LAB — CBC WITH DIFFERENTIAL/PLATELET
Abs Immature Granulocytes: 0.15 10*3/uL — ABNORMAL HIGH (ref 0.00–0.07)
Basophils Absolute: 0.1 10*3/uL (ref 0.0–0.1)
Basophils Relative: 1 %
Eosinophils Absolute: 1.6 10*3/uL — ABNORMAL HIGH (ref 0.0–0.5)
Eosinophils Relative: 20 %
HCT: 27.3 % — ABNORMAL LOW (ref 39.0–52.0)
Hemoglobin: 8.5 g/dL — ABNORMAL LOW (ref 13.0–17.0)
Immature Granulocytes: 2 %
Lymphocytes Relative: 18 %
Lymphs Abs: 1.4 10*3/uL (ref 0.7–4.0)
MCH: 29.4 pg (ref 26.0–34.0)
MCHC: 31.1 g/dL (ref 30.0–36.0)
MCV: 94.5 fL (ref 80.0–100.0)
Monocytes Absolute: 0.6 10*3/uL (ref 0.1–1.0)
Monocytes Relative: 8 %
Neutro Abs: 4 10*3/uL (ref 1.7–7.7)
Neutrophils Relative %: 51 %
Platelets: 306 10*3/uL (ref 150–400)
RBC: 2.89 MIL/uL — ABNORMAL LOW (ref 4.22–5.81)
RDW: 14.8 % (ref 11.5–15.5)
WBC: 7.8 10*3/uL (ref 4.0–10.5)
nRBC: 0 % (ref 0.0–0.2)

## 2019-12-12 LAB — BASIC METABOLIC PANEL
Anion gap: 3 — ABNORMAL LOW (ref 5–15)
BUN: 17 mg/dL (ref 8–23)
CO2: 34 mmol/L — ABNORMAL HIGH (ref 22–32)
Calcium: 7.6 mg/dL — ABNORMAL LOW (ref 8.9–10.3)
Chloride: 100 mmol/L (ref 98–111)
Creatinine, Ser: 0.35 mg/dL — ABNORMAL LOW (ref 0.61–1.24)
GFR calc Af Amer: >60 mL/min (ref >60)
GFR calc non Af Amer: >60 mL/min (ref >60)
Glucose, Bld: 140 mg/dL — ABNORMAL HIGH (ref 70–99)
Potassium: 4.3 mmol/L (ref 3.5–5.1)
Sodium: 137 mmol/L (ref 135–145)

## 2019-12-12 LAB — GLUCOSE, CAPILLARY
Glucose-Capillary: 123 mg/dL — ABNORMAL HIGH (ref 70–99)
Glucose-Capillary: 128 mg/dL — ABNORMAL HIGH (ref 70–99)
Glucose-Capillary: 135 mg/dL — ABNORMAL HIGH (ref 70–99)
Glucose-Capillary: 150 mg/dL — ABNORMAL HIGH (ref 70–99)
Glucose-Capillary: 145 mg/dL — ABNORMAL HIGH (ref 70–99)

## 2019-12-12 MED ORDER — MIDAZOLAM HCL 2 MG/2ML IJ SOLN
INTRAMUSCULAR | Status: AC
  Administered 2019-12-12: 2 mg
  Filled 2019-12-12: qty 2

## 2019-12-12 MED ORDER — HYDROMORPHONE HCL 1 MG/ML IJ SOLN
INTRAMUSCULAR | Status: AC
  Filled 2019-12-12: qty 1

## 2019-12-12 MED ORDER — HYDROMORPHONE HCL 1 MG/ML IJ SOLN
1.0000 mg | Freq: Once | INTRAMUSCULAR | Status: AC
  Administered 2019-12-12: 1 mg via INTRAVENOUS

## 2019-12-12 MED ORDER — FUROSEMIDE 10 MG/ML IJ SOLN
20.0000 mg | Freq: Four times a day (QID) | INTRAMUSCULAR | Status: AC
  Administered 2019-12-12 (×2): 20 mg via INTRAVENOUS
  Filled 2019-12-12 (×2): qty 2

## 2019-12-12 NOTE — Progress Notes (Signed)
NAME:  [name], MRN:  [id number], DOB:  [birth year], LOS: 21 ADMISSION DATE:  11/16/19, CONSULTATION DATE:  4/5 REFERRING MD:  Sloan Leiter, CHIEF COMPLAINT:  Dyspnea   Brief History   64 year old man admitted 4/2 with COVID pneumonia and delirium after 1 week of symptoms.  Intubated on 4/5, required prolonged mechanical ventilatory support.  Tracheostomy performed on 4/20   Past Medical History    Allergic rhinitis   . Arthritis of left acromioclavicular joint 07/25/2014  . Asthma   . Chronic bronchitis ([place])   . Chronic kidney disease    H/O STONES  . Colon polyp   . Coronary artery disease   . Diabetes mellitus without complication ([place])   . Diverticula of colon   . GERD (gastroesophageal reflux disease)   . Hx of heart bypass surgery 2005  . Hypercholesteremia   . Impingement syndrome of left shoulder 07/25/2014  . Laceration of brachial artery 03/11/2016   left arm  . Lumbar disc disease   . Partial nontraumatic rupture of right rotator cuff 10/20/2016    Significant Hospital Events   4/19: tachypneic overnight and this am. Unable to wean sedation. Tracheostomy done  4/20: successful trach yesterday but de-recruited and on some escalated settings today. Remains tachypneic this am. Hypoglycemic yesterday and lantus changed 4/22: started on empiric abx but resp cx with abundant gpc. Improving oxygenation requirement but remains with rass -4 despite light sedation.  4/23:mucous plugging overnight and this am with copious/thick secretions.  4/28: issues overnight with increased vent settings. Still without purposeful responses. Fever overnight as well to 101.1. remains minimally responsive and unable to wean iv sedation except for short periods, despite increase in po agents.  4/29: somewhat improved settings overnight on 60/8. Still with temps overnight to 100.7 5/1: Continued improvement in vent requirement 50/8, remains on several sedating medications including  continuous fentanyl and precedex. Trach changed to 6.0 shiley XLT.  Consults:    Procedures:  04/05 Rt CVC > 4/24 04/16 Bronchoscopy  04/20Tracheostomy 4/24 Rt PICC  Significant Diagnostic Tests:  4/22 CXR: 1. Lines and tubes stable position. 2. Persistent bibasilar pulmonary infiltrates/edema with interim slight clearing from prior exam. 3. Prior CABG. Heart size stable.  4/24 CXR: Slight increase in interstitial and airspace opacities particularly in the lower lobes. Findings suspicious for worsening asymmetric edema or infection.  Head CT 4/27 Mild chronic ischemic white matter disease. No acute intracranial abnormality seen.  Abdominal CT 4/27 > for PEG, trace pneumoperitoneum, extensive airspace disease due to COVID 19, kidney stone left, non-obstructing  MRI brain 5/3 > NAICP, chronic small vessel ischemia, chronic microhemorrhages, stable mild generalized atrophy, ethmoid sinus thickening, bilateral mastoid effusions   Micro Data:  COVID 4/2 >Positive  Blood cultures 4/2 > negative  Respiratory culture 4/5 > Negative  Respiratory culture 4/12 > Normal flora  Respiratory culture 4/21 > Normal flora   Antimicrobials:  4/21 Vancomycin > 4/23 4/21 Zosyn > 4/26  Interim history/subjective:  No acute events  Objective   Blood pressure 118/68, pulse 87, temperature 99.1 F (37.3 C), temperature source Axillary, resp. rate (!) 25, height 5\' 8"  (1.727 m), weight 83.9 kg, SpO2 98 %.    Vent Mode: PRVC FiO2 (%):  [40 %] 40 % Set Rate:  [20 bmp] 20 bmp Vt Set:  [480 mL] 480 mL PEEP:  [8 cmH20] 8 cmH20 Plateau Pressure:  [22 cmH20-26 cmH20] 24 cmH20   Intake/Output Summary (Last 24 hours) at 12/12/2019 7619 Last data  filed at 12/12/2019 0500 Gross per 24 hour  Intake 3074.62 ml  Output 1350 ml  Net 1724.62 ml   Filed Weights   12/10/19 0403 12/11/19 0100 12/12/19 0432  Weight: 81.8 kg 83.3 kg 83.9 kg    Examination:  General:  In bed on vent HENT:  NCAT trach in place PULM: CTA B, vent supported breathing CV: RRR, no mgr GI: BS+, soft, nontender, PEG MSK: normal bulk and tone Neuro: sedated on vent   Resolved Hospital Problem list   COVID-19 pneumonia -Has completed full course of remdesivir and dexamethasone, off isolation 4/23.   Assessment & Plan:  Acute respiratory failure with hypoxemia due to COVID 19 HCAP Pneumomediastinum/pneumopericardium Full mechanical vent support VAP prevention Daily WUA/SBT Continue daily weaning efforts Lasix today  Acute toxic metabolic encephalopathy: MRI brain with some chronic changes but no large cerebral injury, EEG without epileptiform activity Continue klonopin, traozdone, seroquel RASS goal 0 to -1  Continue po oxycodone Stop claritin, doubt this is contributing but probably not adding much  History of asthma albuterol  DM2 with hyperglycemia Continue lantus SSI q4h  Urinary retention Doxazosin Continue foley   Best practice:  Diet: continue tube feeding Pain/Anxiety/Delirium protocol (if indicated): as above VAP protocol (if indicated): yes DVT prophylaxis: lovenox 40mg  daily GI prophylaxis: PPI Glucose control: Lantus+ SSI Mobility: needs mobility attempts Code Status: full Family Communication: will touch base with his wife today Disposition: remain in ICU  Labs   CBC: Recent Labs  Lab 12/08/19 0325 12/09/19 0534 12/10/19 0541 12/11/19 0356 12/12/19 0425  WBC 10.2 8.7 8.0 7.5 7.8  NEUTROABS  --   --   --   --  4.0  HGB 9.5* 8.8* 8.6* 8.3* 8.5*  HCT 30.6* 28.9* 28.6* 26.7* 27.3*  MCV 94.2 96.0 96.6 95.4 94.5  PLT 268 287 283 272 306    Basic Metabolic Panel: Recent Labs  Lab 12/08/19 0325 12/09/19 0534 12/10/19 0541 12/11/19 0356 12/12/19 0425  NA 139 142 141 140 137  K 3.7 3.8 3.9 4.1 4.3  CL 94* 97* 98 98 100  CO2 37* 37* 35* 34* 34*  GLUCOSE 207* 152* 203* 171* 140*  BUN 21 20 23 21 17   CREATININE 0.40* 0.44* 0.45* 0.36* 0.35*   CALCIUM 8.4* 8.4* 8.3* 8.1* 7.6*   GFR: Estimated Creatinine Clearance: 97.1 mL/min (A) (by C-G formula based on SCr of 0.35 mg/dL (L)). Recent Labs  Lab 12/09/19 0534 12/10/19 0541 12/11/19 0356 12/12/19 0425  WBC 8.7 8.0 7.5 7.8    Liver Function Tests: Recent Labs  Lab 12/09/19 0534 12/10/19 0541  AST 20 18  ALT 17 17  ALKPHOS 61 54  BILITOT 0.4 0.6  PROT 5.7* 5.5*  ALBUMIN 1.6* 1.6*   No results for input(s): LIPASE, AMYLASE in the last 168 hours. No results for input(s): AMMONIA in the last 168 hours.  ABG    Component Value Date/Time   PHART 7.448 11/17/2019 0519   PCO2ART 54.0 (H) 11/17/2019 0519   PO2ART 90.0 11/17/2019 0519   HCO3 37.2 (H) 11/17/2019 0519   TCO2 39 (H) 11/17/2019 0519   ACIDBASEDEF 1.0 11/11/2019 1529   O2SAT 97.0 11/17/2019 0519     Coagulation Profile: No results for input(s): INR, PROTIME in the last 168 hours.  Cardiac Enzymes: No results for input(s): CKTOTAL, CKMB, CKMBINDEX, TROPONINI in the last 168 hours.  HbA1C: Hgb A1c MFr Bld  Date/Time Value Ref Range Status  11/16/2019 06:51 PM 7.5 (H) 4.8 - 5.6 %  Final    Comment:    (NOTE) Pre diabetes:          5.7%-6.4% Diabetes:              >6.4% Glycemic control for   <7.0% adults with diabetes   12/19/2018 02:21 PM 7.7 (H) 4.6 - 6.5 % Final    Comment:    Glycemic Control Guidelines for People with Diabetes:Non Diabetic:  <6%Goal of Therapy: <7%Additional Action Suggested:  >8%     CBG: Recent Labs  Lab 12/11/19 1117 12/11/19 1525 12/11/19 1939 12/11/19 2328 12/12/19 0424  GLUCAP 157* 118* 153* 126* 123*     Critical care time: 35 minutes     Heber Vevay, MD [place] Pager: [phone] Cell: [phone] If no response, call [phone]

## 2019-12-12 NOTE — Progress Notes (Signed)
PT Cancellation Note  Patient Details Name: [name] MRN: [id number] DOB: [birth year]   Cancelled Treatment:    Reason Eval/Treat Not Completed: Patient not medically ready.  Pt not arousable on minimal sedation.   12/12/2019  Jacinto Halim., PT Acute Rehabilitation Services [phone]  (pager) [phone]  (office)   [name] 12/12/2019, 12:36 PM

## 2019-12-12 NOTE — Progress Notes (Signed)
eLink Physician-Brief Progress Note Patient Name: [name] DOB: [birth year] MRN: [id number]   Date of Service  12/12/2019  HPI/Events of Note  No AM lab orders.  eICU Interventions  Will order: 1. CBC with platelets and BMP at 5 AM.     Intervention Category Major Interventions: Other:  [name] 12/12/2019, 3:17 AM

## 2019-12-13 DIAGNOSIS — I2510 Atherosclerotic heart disease of native coronary artery without angina pectoris: Secondary | ICD-10-CM | POA: Diagnosis not present

## 2019-12-13 DIAGNOSIS — J80 Acute respiratory distress syndrome: Secondary | ICD-10-CM | POA: Diagnosis not present

## 2019-12-13 DIAGNOSIS — J9601 Acute respiratory failure with hypoxia: Secondary | ICD-10-CM | POA: Diagnosis not present

## 2019-12-13 DIAGNOSIS — U071 COVID-19: Secondary | ICD-10-CM | POA: Diagnosis not present

## 2019-12-13 LAB — CBC WITH DIFFERENTIAL/PLATELET
Abs Immature Granulocytes: 0.2 10*3/uL — ABNORMAL HIGH (ref 0.00–0.07)
Basophils Absolute: 0.1 10*3/uL (ref 0.0–0.1)
Basophils Relative: 1 %
Eosinophils Absolute: 2.2 10*3/uL — ABNORMAL HIGH (ref 0.0–0.5)
Eosinophils Relative: 24 %
HCT: 26.9 % — ABNORMAL LOW (ref 39.0–52.0)
Hemoglobin: 8.3 g/dL — ABNORMAL LOW (ref 13.0–17.0)
Immature Granulocytes: 2 %
Lymphocytes Relative: 17 %
Lymphs Abs: 1.6 10*3/uL (ref 0.7–4.0)
MCH: 28.8 pg (ref 26.0–34.0)
MCHC: 30.9 g/dL (ref 30.0–36.0)
MCV: 93.4 fL (ref 80.0–100.0)
Monocytes Absolute: 0.7 10*3/uL (ref 0.1–1.0)
Monocytes Relative: 8 %
Neutro Abs: 4.5 10*3/uL (ref 1.7–7.7)
Neutrophils Relative %: 48 %
Platelets: 290 10*3/uL (ref 150–400)
RBC: 2.88 MIL/uL — ABNORMAL LOW (ref 4.22–5.81)
RDW: 14.8 % (ref 11.5–15.5)
WBC: 9.3 10*3/uL (ref 4.0–10.5)
nRBC: 0.2 % (ref 0.0–0.2)

## 2019-12-13 LAB — COMPREHENSIVE METABOLIC PANEL
ALT: 14 U/L (ref 0–44)
AST: 18 U/L (ref 15–41)
Albumin: 1.5 g/dL — ABNORMAL LOW (ref 3.5–5.0)
Alkaline Phosphatase: 51 U/L (ref 38–126)
Anion gap: 8 (ref 5–15)
BUN: 18 mg/dL (ref 8–23)
CO2: 36 mmol/L — ABNORMAL HIGH (ref 22–32)
Calcium: 8.3 mg/dL — ABNORMAL LOW (ref 8.9–10.3)
Chloride: 95 mmol/L — ABNORMAL LOW (ref 98–111)
Creatinine, Ser: 0.32 mg/dL — ABNORMAL LOW (ref 0.61–1.24)
GFR calc Af Amer: >60 mL/min (ref >60)
GFR calc non Af Amer: >60 mL/min (ref >60)
Glucose, Bld: 190 mg/dL — ABNORMAL HIGH (ref 70–99)
Potassium: 3.6 mmol/L (ref 3.5–5.1)
Sodium: 139 mmol/L (ref 135–145)
Total Bilirubin: 0.5 mg/dL (ref 0.3–1.2)
Total Protein: 5.6 g/dL — ABNORMAL LOW (ref 6.5–8.1)

## 2019-12-13 LAB — GLUCOSE, CAPILLARY
Glucose-Capillary: 111 mg/dL — ABNORMAL HIGH (ref 70–99)
Glucose-Capillary: 127 mg/dL — ABNORMAL HIGH (ref 70–99)
Glucose-Capillary: 147 mg/dL — ABNORMAL HIGH (ref 70–99)
Glucose-Capillary: 156 mg/dL — ABNORMAL HIGH (ref 70–99)
Glucose-Capillary: 157 mg/dL — ABNORMAL HIGH (ref 70–99)
Glucose-Capillary: 160 mg/dL — ABNORMAL HIGH (ref 70–99)
Glucose-Capillary: 178 mg/dL — ABNORMAL HIGH (ref 70–99)

## 2019-12-13 MED ORDER — FUROSEMIDE 10 MG/ML IJ SOLN
20.0000 mg | Freq: Four times a day (QID) | INTRAMUSCULAR | Status: AC
  Administered 2019-12-13 (×3): 20 mg via INTRAVENOUS
  Filled 2019-12-13 (×3): qty 2

## 2019-12-13 MED ORDER — MIDAZOLAM HCL 2 MG/2ML IJ SOLN: 2.0000 mg | Freq: Once | INTRAMUSCULAR | Status: AC

## 2019-12-13 MED ORDER — MIDAZOLAM HCL 2 MG/2ML IJ SOLN
INTRAMUSCULAR | Status: AC
  Administered 2019-12-13: 2 mg via INTRAVENOUS
  Filled 2019-12-13: qty 2

## 2019-12-13 MED ORDER — COLLAGENASE 250 UNIT/GM EX OINT
TOPICAL_OINTMENT | Freq: Every day | CUTANEOUS | Status: DC
  Filled 2019-12-13: qty 30

## 2019-12-13 MED ORDER — GABAPENTIN 250 MG/5ML PO SOLN
300.0000 mg | Freq: Three times a day (TID) | ORAL | Status: DC
  Administered 2019-12-13 – 2019-12-15 (×7): 300 mg
  Filled 2019-12-13 (×8): qty 6

## 2019-12-13 MED ORDER — POTASSIUM CHLORIDE 20 MEQ/15ML (10%) PO SOLN
40.0000 meq | Freq: Once | ORAL | Status: AC
  Administered 2019-12-13: 40 meq
  Filled 2019-12-13: qty 30

## 2019-12-13 MED ORDER — FENTANYL 50 MCG/HR TD PT72
1.0000 | MEDICATED_PATCH | TRANSDERMAL | Status: DC
  Administered 2019-12-13: 1 via TRANSDERMAL
  Filled 2019-12-13: qty 1

## 2019-12-13 NOTE — Consult Note (Addendum)
WOC Nurse Consult Note: Reason for Consult: Consult requested for sacrum. Pt is critically ill with multiple systemic factors which can impair healing.  Wound type: Pt has a previously noted deep tissue injury, dark red-purple, which is evolving to Unstageable slough over sacrum/upper gluteal cleft Pressure Injury POA: No Measurement: 6X3cm Drainage (amount, consistency, odor) mod amt tan drainage, some odor  Dressing procedure/placement/frequency: Pt is on a low airloss mattress to reduce pressure. Topical treatment orders provided for the bedside nurses to perform to provide enzymatic debridement as follows: Apply Santyl to sacrum wound Q day, then cover with mopist gauze and foam dressing.  (Change foam dressing Q 3 days or PRN soiling.) WOC team will assess location Q 7-10 days to determine if a change in topical treatment is indicated at that time.  Cammie Mcgee MSN, RN, CWOCN, [place], CNS [phone]

## 2019-12-13 NOTE — Progress Notes (Signed)
Physical Therapy Treatment Patient Details Name: [name] MRN: [id number] DOB: 07/31/1956 Today's Date: 12/13/2019    History of Present Illness pt is a 64 y/o male with pmhx significant for CAD, DM, CABG, ACDF admitted 4/2 with COVID PNA and delirium, intubated 4/5, proned and paralyzed 4/5-4/9, sedated until 4/12, Bronchoscopy for tube change 4/16, 4/19 trached.  Remains sedated.    PT Comments    Patient progressing very slowly and remains appropriate for LTACH.  He was on weaning mode with EOB activities today with RR up to 55 and HR 115.  Still globally weak with minimal movement for hand squeeze on R UE.  Head control and trunk control absent as well as no active movement noted in legs, possibly due to paralytics.  PT to follow.    Follow Up Recommendations  LTACH;SNF     Equipment Recommendations  Other (comment)(TBA)    Recommendations for Other Services       Precautions / Restrictions Precautions Precautions: Fall Precaution Comments: trach, flexiseal, coretrack    Mobility  Bed Mobility Overal bed mobility: Needs Assistance Bed Mobility: Rolling;Sidelying to Sit;Sit to Supine Rolling: Total assist;+2 for physical assistance Sidelying to sit: Total assist;+2 for physical assistance   Sit to supine: Total assist;+2 for physical assistance   General bed mobility comments: light sedation, pt with eyes open, squeezes R hand to command, but no other active movement noted; assist for all aspects  Transfers                 General transfer comment: NT  Ambulation/Gait                 Stairs             Wheelchair Mobility    Modified Rankin (Stroke Patients Only)       Balance Overall balance assessment: Needs assistance   Sitting balance-Leahy Scale: Zero Sitting balance - Comments: max A for head control and balance, sat EOB about 7 minutes; work on head rotation, flex & ext and expansion of chest and lumbar with assist from  behind,                                    Cognition Arousal/Alertness: Lethargic Behavior During Therapy: Flat affect Overall Cognitive Status: Difficult to assess                                        Exercises      General Comments General comments (skin integrity, edema, etc.): on PS/CPAP 50% FiO2 PEEP 5 during session with RR max 55, HR 115, SpO2 92%; RN aware      Pertinent Vitals/Pain Faces Pain Scale: No hurt    Home Living                      Prior Function            PT Goals (current goals can now be found in the care plan section) Progress towards PT goals: Progressing toward goals(limited)    Frequency           PT Plan Current plan remains appropriate    Co-evaluation              AM-PAC PT "6 Clicks" Mobility   Outcome Measure  Help  needed turning from your back to your side while in a flat bed without using bedrails?: Total Help needed moving from lying on your back to sitting on the side of a flat bed without using bedrails?: Total Help needed moving to and from a bed to a chair (including a wheelchair)?: Total Help needed standing up from a chair using your arms (e.g., wheelchair or bedside chair)?: Total Help needed to walk in hospital room?: Total Help needed climbing 3-5 steps with a railing? : Total 6 Click Score: 6    End of Session Equipment Utilized During Treatment: (vent) Activity Tolerance: Treatment limited secondary to medical complications (Comment)(increased RR) Patient left: in bed;with call bell/phone within reach;with SCD's reapplied;with nursing/sitter in room   PT Visit Diagnosis: Muscle weakness (generalized) (M62.81);Other symptoms and signs involving the nervous system (R29.898);Adult, failure to thrive (R62.7)     Time: 0933-1000 PT Time Calculation (min) (ACUTE ONLY): 27 min  Charges:  $Therapeutic Activity: 23-37 mins                     Magda Kiel, [place] [phone] 12/13/2019    Reginia Naas 12/13/2019, 3:18 PM

## 2019-12-13 NOTE — TOC Progression Note (Signed)
Transition of Care [facility]) - Progression Note    Patient Details  Name: [name] MRN: [id number] Date of Birth: 20-Sep-1955  Transition of Care [facility]) CM/SW Contact  Gabbie Marzo, Manfred Arch, RN Phone Number: 12/13/2019, 3:27 PM  Clinical Narrative:    Per attendings discussion with pts wife today - plan will not be comfort care at this time.  CM reached out to [employer] - [employer] will re-review for appropriateness Monday as auth can not be obtained over weekend.        Expected Discharge Plan and Services                                                 Social Determinants of Health (SDOH) Interventions    Readmission Risk Interventions No flowsheet data found.

## 2019-12-13 NOTE — Progress Notes (Signed)
NAME:  [name], MRN:  [id number], DOB:  02/12/1956, LOS: 59 ADMISSION DATE:  2019-11-11, CONSULTATION DATE:  4/5 REFERRING MD:  Sloan Leiter, CHIEF COMPLAINT:  Dyspnea   Brief History   64 year old man admitted 4/2 with COVID pneumonia and delirium after 1 week of symptoms.  Intubated on 4/5, required prolonged mechanical ventilatory support.  Tracheostomy performed on 4/20  Past Medical History    Allergic rhinitis   . Arthritis of left acromioclavicular joint 07/25/2014  . Asthma   . Chronic bronchitis ([place])   . Chronic kidney disease    H/O STONES  . Colon polyp   . Coronary artery disease   . Diabetes mellitus without complication ([place])   . Diverticula of colon   . GERD (gastroesophageal reflux disease)   . Hx of heart bypass surgery 2005  . Hypercholesteremia   . Impingement syndrome of left shoulder 07/25/2014  . Laceration of brachial artery 03/11/2016   left arm  . Lumbar disc disease   . Partial nontraumatic rupture of right rotator cuff 10/20/2016    Significant Hospital Events   4/19: tachypneic overnight and this am. Unable to wean sedation. Tracheostomy done  4/20: successful trach yesterday but de-recruited and on some escalated settings today. Remains tachypneic this am. Hypoglycemic yesterday and lantus changed 4/22: started on empiric abx but resp cx with abundant gpc. Improving oxygenation requirement but remains with rass -4 despite light sedation.  4/23:mucous plugging overnight and this am with copious/thick secretions.  4/28: issues overnight with increased vent settings. Still without purposeful responses. Fever overnight as well to 101.1. remains minimally responsive and unable to wean iv sedation except for short periods, despite increase in po agents.  4/29: somewhat improved settings overnight on 60/8. Still with temps overnight to 100.7 5/1: Continued improvement in vent requirement 50/8, remains on several sedating medications including  continuous fentanyl and precedex. Trach changed to 6.0 shiley XLT. 5/7 tolerating pressure support ventilation while on sedation  Consults:    Procedures:  04/05 Rt CVC > 4/24 04/16 Bronchoscopy  04/20Tracheostomy 4/24 Rt PICC  Significant Diagnostic Tests:  4/22 CXR: 1. Lines and tubes stable position. 2. Persistent bibasilar pulmonary infiltrates/edema with interim slight clearing from prior exam. 3. Prior CABG. Heart size stable.  4/24 CXR: Slight increase in interstitial and airspace opacities particularly in the lower lobes. Findings suspicious for worsening asymmetric edema or infection.  Head CT 4/27 Mild chronic ischemic white matter disease. No acute intracranial abnormality seen.  Abdominal CT 4/27 > for PEG, trace pneumoperitoneum, extensive airspace disease due to COVID 19, kidney stone left, non-obstructing  MRI brain 5/3 > NAICP, chronic small vessel ischemia, chronic microhemorrhages, stable mild generalized atrophy, ethmoid sinus thickening, bilateral mastoid effusions   Micro Data:  COVID 4/2 >Positive  Blood cultures 4/2 > negative  Respiratory culture 4/5 > Negative  Respiratory culture 4/12 > Normal flora  Respiratory culture 4/21 > Normal flora   Antimicrobials:  4/21 Vancomycin > 4/23 4/21 Zosyn > 4/26  Interim history/subjective:   Extremely agitated yesterday when sedation held This morning he is breathing comfortably while he is sedation  Objective   Blood pressure 96/65, pulse 62, temperature 99.4 F (37.4 C), temperature source Oral, resp. rate (!) 21, height 5\' 8"  (1.727 m), weight 79.8 kg, SpO2 99 %.    Vent Mode: PRVC FiO2 (%):  [40 %-60 %] 50 % Set Rate:  [20 bmp] 20 bmp Vt Set:  [480 mL] 480 mL PEEP:  [8 cmH20]  8 cmH20 Pressure Support:  [8 cmH20] 8 cmH20 Plateau Pressure:  [25 cmH20-29 cmH20] 29 cmH20   Intake/Output Summary (Last 24 hours) at 12/13/2019 0730 Last data filed at 12/13/2019 0700 Gross per 24 hour   Intake 2665.89 ml  Output 2220 ml  Net 445.89 ml   Filed Weights   12/11/19 0100 12/12/19 0432 12/13/19 0402  Weight: 83.3 kg 83.9 kg 79.8 kg    Examination:  General:  In bed on vent HENT: NCAT tracheostomy in place, cor track in place PULM: CTA B, vent supported breathing CV: RRR, no mgr GI: BS+, soft, nontender  MSK: normal bulk and tone Neuro: sedated on vent but will wake up and follow commands (stick out tongue), cannot move hands/arms to command   Resolved Hospital Problem list   COVID-19 pneumonia -Has completed full course of remdesivir and dexamethasone, off isolation 4/23.   Assessment & Plan:  Acute respiratory failure with hypoxemia due to COVID 19 > 5/7 vent mechanics and oxygenation on pressure support (15/5) are reasonable when he is sedated Severe respiratory muscle weakness HCAP> resolved Pneumomediastinum/pneumopericardium > improving Pressure support weaning as long as tolerated during daytime 5/7 Slow wean every day Continue diuresis Tracheostomy care per routine Lasix again 5/7   Acute toxic metabolic encephalopathy: MRI brain with some chronic changes but no large cerebral injury, EEG without epileptiform activity 5/7 improved, following commands Sedated/respirations calmer with fentanyl infusion, severe agitation off fentanyl Suspect oxycodone intolerance Severe ICU myopathy Change baseline narcotic from oxycodone to fentanyl over next 72 hours> add fentanyl patch, wean off oxycodone Continue clonazepam, trazodone, seroquel Change gabapentin to tid RASS goal 0 to -1  History of asthma Albuterol  DM2 with hyperglycemia Continue Lantus SSI q4h  Urinary retention Doxazosin Foley catheter > discuss on rounds today if we can remove  Goals of care: plan to speak with his wife today.  He is following commands and tolerating pressure support this morning.  I suspect that he has the potential to recover, but it will take weeks to months with a  high likelihood of complication.  We will have a frank discussion about what that would look like (prolonged hospitalization, LTACH, PT, tube feeding etc) and ask his wife if she believes he would be willing to go through that.   Best practice:  Diet: continue tube feeding Pain/Anxiety/Delirium protocol (if indicated): as above VAP protocol (if indicated): yes DVT prophylaxis: lovenox 40mg  daily GI prophylaxis: PPI Glucose control: Lantus+ SSI Mobility: needs mobility attempts Code Status: full Family Communication: tried to contact Sybil on 5/7, unable to reach Disposition: remain in ICU  Labs   CBC: Recent Labs  Lab 12/09/19 0534 12/10/19 0541 12/11/19 0356 12/12/19 0425 12/13/19 0447  WBC 8.7 8.0 7.5 7.8 9.3  NEUTROABS  --   --   --  4.0 4.5  HGB 8.8* 8.6* 8.3* 8.5* 8.3*  HCT 28.9* 28.6* 26.7* 27.3* 26.9*  MCV 96.0 96.6 95.4 94.5 93.4  PLT 287 283 272 306 290    Basic Metabolic Panel: Recent Labs  Lab 12/09/19 0534 12/10/19 0541 12/11/19 0356 12/12/19 0425 12/13/19 0447  NA 142 141 140 137 139  K 3.8 3.9 4.1 4.3 3.6  CL 97* 98 98 100 95*  CO2 37* 35* 34* 34* 36*  GLUCOSE 152* 203* 171* 140* 190*  BUN 20 23 21 17 18   CREATININE 0.44* 0.45* 0.36* 0.35* 0.32*  CALCIUM 8.4* 8.3* 8.1* 7.6* 8.3*   GFR: Estimated Creatinine Clearance: 89.1 mL/min (A) (by C-G  formula based on SCr of 0.32 mg/dL (L)). Recent Labs  Lab 12/10/19 0541 12/11/19 0356 12/12/19 0425 12/13/19 0447  WBC 8.0 7.5 7.8 9.3    Liver Function Tests: Recent Labs  Lab 12/09/19 0534 12/10/19 0541 12/13/19 0447  AST 20 18 18   ALT 17 17 14   ALKPHOS 61 54 51  BILITOT 0.4 0.6 0.5  PROT 5.7* 5.5* 5.6*  ALBUMIN 1.6* 1.6* 1.5*   No results for input(s): LIPASE, AMYLASE in the last 168 hours. No results for input(s): AMMONIA in the last 168 hours.  ABG    Component Value Date/Time   PHART 7.448 11/17/2019 0519   PCO2ART 54.0 (H) 11/17/2019 0519   PO2ART 90.0 11/17/2019 0519   HCO3  37.2 (H) 11/17/2019 0519   TCO2 39 (H) 11/17/2019 0519   ACIDBASEDEF 1.0 11/11/2019 1529   O2SAT 97.0 11/17/2019 0519     Coagulation Profile: No results for input(s): INR, PROTIME in the last 168 hours.  Cardiac Enzymes: No results for input(s): CKTOTAL, CKMB, CKMBINDEX, TROPONINI in the last 168 hours.  HbA1C: Hgb A1c MFr Bld  Date/Time Value Ref Range Status  12-06-2019 06:51 PM 7.5 (H) 4.8 - 5.6 % Final    Comment:    (NOTE) Pre diabetes:          5.7%-6.4% Diabetes:              >6.4% Glycemic control for   <7.0% adults with diabetes   12/19/2018 02:21 PM 7.7 (H) 4.6 - 6.5 % Final    Comment:    Glycemic Control Guidelines for People with Diabetes:Non Diabetic:  <6%Goal of Therapy: <7%Additional Action Suggested:  >8%     CBG: Recent Labs  Lab 12/12/19 1228 12/12/19 1601 12/12/19 1942 12/13/19 0003 12/13/19 0402  GLUCAP 135* 150* 145* 111* 156*     Critical care time: 35 minutes     02/12/20, MD [facility] Pager: [phone] Cell: [phone] If no response, call [phone]

## 2019-12-13 NOTE — Progress Notes (Signed)
Family conversation telephone note.   I had a lengthy conversation with the patient's wife [name] today.  I explained to her that [name] was able to follow commands for [place] and do some breathing on pressure support ventilation today.  I explained to her that while this is promising information this does not change our team's overall concern that it will take weeks to months for him to recover.  He appears to have profound neuromuscular weakness and in order to recover from this he will need several weeks if not months of physical therapy, attempts to wean from the ventilator, and nutrition attempts.  I explained that during this time his risk of having a life-threatening complication will be extremely high.  In addition, while going through rehab attempts he will suffer with pain, feelings of shortness of breath, and likely repeated infections.  I asked his wife if she felt that this would be something he would be willing to undergo, she says that she thinks he would though she understands that he [name] not survive this overall.  After this conversation she asked that we continue aggressive management and pursue aggressive rehab.  Heber [place], MD [facility] Pager: [phone] Cell: [phone] If no response, call [phone]

## 2019-12-13 NOTE — Progress Notes (Signed)
[facility] ADULT ICU REPLACEMENT PROTOCOL FOR AM LAB REPLACEMENT ONLY  The patient does apply for the [facility] Adult ICU Electrolyte Replacment Protocol based on the criteria listed below:   1. Is GFR >/= 40 ml/min? Yes.    Patient's GFR today is >60 2. Is urine output >/= 0.5 ml/kg/hr for the last 6 hours? Yes.   Patient's UOP is n/a ml/kg/hr 3. Is BUN < 60 mg/dL? Yes.    Patient's BUN today is 18 4. Abnormal electrolyte(s): K-3.6 5. Ordered repletion with: per protocol 6. If a panic level lab has been reported, has the CCM MD in charge been notified? Yes.   Physician: Dr. Janne Lab, [place] 12/13/2019 6:26 AM

## 2019-12-14 DIAGNOSIS — J9601 Acute respiratory failure with hypoxia: Secondary | ICD-10-CM | POA: Diagnosis not present

## 2019-12-14 DIAGNOSIS — G9340 Encephalopathy, unspecified: Secondary | ICD-10-CM | POA: Diagnosis not present

## 2019-12-14 DIAGNOSIS — R0603 Acute respiratory distress: Secondary | ICD-10-CM | POA: Diagnosis not present

## 2019-12-14 LAB — BASIC METABOLIC PANEL
Anion gap: 11 (ref 5–15)
BUN: 18 mg/dL (ref 8–23)
CO2: 33 mmol/L — ABNORMAL HIGH (ref 22–32)
Calcium: 8.6 mg/dL — ABNORMAL LOW (ref 8.9–10.3)
Chloride: 91 mmol/L — ABNORMAL LOW (ref 98–111)
Creatinine, Ser: 0.48 mg/dL — ABNORMAL LOW (ref 0.61–1.24)
GFR calc Af Amer: >60 mL/min (ref >60)
GFR calc non Af Amer: >60 mL/min (ref >60)
Glucose, Bld: 226 mg/dL — ABNORMAL HIGH (ref 70–99)
Potassium: 4.6 mmol/L (ref 3.5–5.1)
Sodium: 135 mmol/L (ref 135–145)

## 2019-12-14 LAB — CBC WITH DIFFERENTIAL/PLATELET
Abs Immature Granulocytes: 0.3 10*3/uL — ABNORMAL HIGH (ref 0.00–0.07)
Basophils Absolute: 0.1 10*3/uL (ref 0.0–0.1)
Basophils Relative: 1 %
Eosinophils Absolute: 2.2 10*3/uL — ABNORMAL HIGH (ref 0.0–0.5)
Eosinophils Relative: 20 %
HCT: 29.4 % — ABNORMAL LOW (ref 39.0–52.0)
Hemoglobin: 9.2 g/dL — ABNORMAL LOW (ref 13.0–17.0)
Immature Granulocytes: 3 %
Lymphocytes Relative: 18 %
Lymphs Abs: 1.9 10*3/uL (ref 0.7–4.0)
MCH: 28.8 pg (ref 26.0–34.0)
MCHC: 31.3 g/dL (ref 30.0–36.0)
MCV: 92.2 fL (ref 80.0–100.0)
Monocytes Absolute: 0.8 10*3/uL (ref 0.1–1.0)
Monocytes Relative: 7 %
Neutro Abs: 5.6 10*3/uL (ref 1.7–7.7)
Neutrophils Relative %: 51 %
Platelets: 315 10*3/uL (ref 150–400)
RBC: 3.19 MIL/uL — ABNORMAL LOW (ref 4.22–5.81)
RDW: 14.9 % (ref 11.5–15.5)
WBC: 10.8 10*3/uL — ABNORMAL HIGH (ref 4.0–10.5)
nRBC: 0 % (ref 0.0–0.2)

## 2019-12-14 LAB — COMPREHENSIVE METABOLIC PANEL
ALT: 13 U/L (ref 0–44)
AST: 20 U/L (ref 15–41)
Albumin: 1.7 g/dL — ABNORMAL LOW (ref 3.5–5.0)
Alkaline Phosphatase: 57 U/L (ref 38–126)
Anion gap: 13 (ref 5–15)
BUN: 16 mg/dL (ref 8–23)
CO2: 32 mmol/L (ref 22–32)
Calcium: 8.3 mg/dL — ABNORMAL LOW (ref 8.9–10.3)
Chloride: 91 mmol/L — ABNORMAL LOW (ref 98–111)
Creatinine, Ser: 0.41 mg/dL — ABNORMAL LOW (ref 0.61–1.24)
GFR calc Af Amer: >60 mL/min (ref >60)
GFR calc non Af Amer: >60 mL/min (ref >60)
Glucose, Bld: 156 mg/dL — ABNORMAL HIGH (ref 70–99)
Potassium: 3.8 mmol/L (ref 3.5–5.1)
Sodium: 136 mmol/L (ref 135–145)
Total Bilirubin: 0.6 mg/dL (ref 0.3–1.2)
Total Protein: 6.3 g/dL — ABNORMAL LOW (ref 6.5–8.1)

## 2019-12-14 LAB — GLUCOSE, CAPILLARY
Glucose-Capillary: 157 mg/dL — ABNORMAL HIGH (ref 70–99)
Glucose-Capillary: 224 mg/dL — ABNORMAL HIGH (ref 70–99)
Glucose-Capillary: 220 mg/dL — ABNORMAL HIGH (ref 70–99)
Glucose-Capillary: 199 mg/dL — ABNORMAL HIGH (ref 70–99)
Glucose-Capillary: 197 mg/dL — ABNORMAL HIGH (ref 70–99)

## 2019-12-14 MED ORDER — FUROSEMIDE 10 MG/ML IJ SOLN
40.0000 mg | Freq: Four times a day (QID) | INTRAMUSCULAR | Status: AC
  Administered 2019-12-14 (×3): 40 mg via INTRAVENOUS
  Filled 2019-12-14 (×3): qty 4

## 2019-12-14 MED ORDER — METOLAZONE 5 MG PO TABS
5.0000 mg | ORAL_TABLET | Freq: Once | ORAL | Status: AC
  Administered 2019-12-14: 5 mg via ORAL
  Filled 2019-12-14: qty 1

## 2019-12-14 MED ORDER — MAGNESIUM SULFATE IN D5W 1-5 GM/100ML-% IV SOLN
1.0000 g | Freq: Once | INTRAVENOUS | Status: AC
  Administered 2019-12-14: 1 g via INTRAVENOUS
  Filled 2019-12-14: qty 100

## 2019-12-14 MED ORDER — POTASSIUM CHLORIDE 20 MEQ/15ML (10%) PO SOLN
40.0000 meq | Freq: Four times a day (QID) | ORAL | Status: AC
  Administered 2019-12-14 (×2): 40 meq via ORAL
  Filled 2019-12-14 (×2): qty 30

## 2019-12-14 NOTE — Progress Notes (Signed)
eLink Physician-Brief Progress Note Patient Name: [name] DOB: [birth year] MRN: [id number]   Date of Service  12/14/2019  HPI/Events of Note  AM CBC requested   eICU Interventions  Order placed     Intervention Category Minor Interventions: Clinical assessment - ordering diagnostic tests  Oretha Milch 12/14/2019, 12:48 AM

## 2019-12-14 NOTE — Progress Notes (Signed)
NAME:  [name], MRN:  [id number], DOB:  Jun 01, 1956, LOS: 36 ADMISSION DATE:  11/14/2019, CONSULTATION DATE:  4/5 REFERRING MD:  Jerral Ibrahima, CHIEF COMPLAINT:  Dyspnea   Brief History   64 year old man admitted 4/2 with COVID pneumonia and delirium after 1 week of symptoms.  Intubated on 4/5, required prolonged mechanical ventilatory support.  Tracheostomy performed on 4/20  Past Medical History    Allergic rhinitis   . Arthritis of left acromioclavicular joint 07/25/2014  . Asthma   . Chronic bronchitis ([facility])   . Chronic kidney disease    H/O STONES  . Colon polyp   . Coronary artery disease   . Diabetes mellitus without complication ([facility])   . Diverticula of colon   . GERD (gastroesophageal reflux disease)   . Hx of heart bypass surgery 2005  . Hypercholesteremia   . Impingement syndrome of left shoulder 07/25/2014  . Laceration of brachial artery 03/11/2016   left arm  . Lumbar disc disease   . Partial nontraumatic rupture of right rotator cuff 10/20/2016    Significant Hospital Events   4/19: tachypneic overnight and this am. Unable to wean sedation. Tracheostomy done  4/20: successful trach yesterday but de-recruited and on some escalated settings today. Remains tachypneic this am. Hypoglycemic yesterday and lantus changed 4/22: started on empiric abx but resp cx with abundant gpc. Improving oxygenation requirement but remains with rass -4 despite light sedation.  4/23:mucous plugging overnight and this am with copious/thick secretions.  4/28: issues overnight with increased vent settings. Still without purposeful responses. Fever overnight as well to 101.1. remains minimally responsive and unable to wean iv sedation except for short periods, despite increase in po agents.  4/29: somewhat improved settings overnight on 60/8. Still with temps overnight to 100.7 5/1: Continued improvement in vent requirement 50/8, remains on several sedating medications including  continuous fentanyl and precedex. Trach changed to 6.0 shiley XLT. 5/7 tolerating pressure support ventilation while on sedation  Consults:    Procedures:  04/05 Rt CVC > 4/24 04/16 Bronchoscopy  04/20Tracheostomy 4/24 Rt PICC  Significant Diagnostic Tests:  4/22 CXR: 1. Lines and tubes stable position. 2. Persistent bibasilar pulmonary infiltrates/edema with interim slight clearing from prior exam. 3. Prior CABG. Heart size stable.  4/24 CXR: Slight increase in interstitial and airspace opacities particularly in the lower lobes. Findings suspicious for worsening asymmetric edema or infection.  Head CT 4/27 Mild chronic ischemic white matter disease. No acute intracranial abnormality seen.  Abdominal CT 4/27 > for PEG, trace pneumoperitoneum, extensive airspace disease due to COVID 19, kidney stone left, non-obstructing  MRI brain 5/3 > NAICP, chronic small vessel ischemia, chronic microhemorrhages, stable mild generalized atrophy, ethmoid sinus thickening, bilateral mastoid effusions   Micro Data:  COVID 4/2 >Positive  Blood cultures 4/2 > negative  Respiratory culture 4/5 > Negative  Respiratory culture 4/12 > Normal flora  Respiratory culture 4/21 > Normal flora   Antimicrobials:  4/21 Vancomycin > 4/23 4/21 Zosyn > 4/26  Interim history/subjective:   Patient with no issues overnight tracheostomy tube in place  Objective   Blood pressure 120/66, pulse 92, temperature (!) 97.5 F (36.4 C), temperature source Axillary, resp. rate (!) 22, height 5\' 8"  (1.727 m), weight 84.7 kg, SpO2 99 %.    Vent Mode: PRVC FiO2 (%):  [45 %-50 %] 45 % Set Rate:  [20 bmp] 20 bmp Vt Set:  [480 mL] 480 mL PEEP:  [8 cmH20] 8 cmH20 Plateau Pressure:  [21  cmH20-27 cmH20] 26 cmH20   Intake/Output Summary (Last 24 hours) at 12/14/2019 0939 Last data filed at 12/14/2019 0900 Gross per 24 hour  Intake 2708.63 ml  Output 1010 ml  Net 1698.63 ml   Filed Weights   12/12/19  0432 12/13/19 0402 12/14/19 0306  Weight: 83.9 kg 79.8 kg 84.7 kg    Examination: General: In bed on mechanical support HENT: NCAT, tracheostomy tube in place, NG feeding tube in place PULM: Clear to auscultation bilaterally ventilated breath sounds CV: Regular rate rhythm, S1-S2 GI: Soft, nontender nondistended MSK: Normal bulk and tone Neuro: Sedated on mechanical ventilation.  He will follow some commands when sedation is fully off.   Resolved Hospital Problem list   COVID-19 pneumonia -Has completed full course of remdesivir and dexamethasone, off isolation 4/23.   Assessment & Plan:   Acute respiratory failure with hypoxemia due to COVID 19  Severe respiratory muscle weakness HCAP> resolved Pneumomediastinum/pneumopericardium > improving Continue prolonged weaning from mechanical support with pressure support Continue diuresis to maintain euvolemia Routine tracheostomy care  Acute toxic metabolic encephalopathy: MRI brain with some chronic changes but no large cerebral injury, EEG without epileptiform activity Sedated/respirations calmer with fentanyl infusion, severe agitation off fentanyl Suspect oxycodone intolerance Severe ICU myopathy Changing baseline narcotic regimen from oxycodone to fentanyl patches In the process of making this transition Continue clonazepam trazodone and Seroquel Gabapentin 3 times daily, I wonder if the gabapentin can be too sedating  Positive Cumulative Fluid Balance  Diuresis today, follow urine output ensure electrolytes are stable.  History of asthma Albuterol as needed  DM2 with hyperglycemia Lantus, SSI with CBGs  Urinary retention Doxazosin  Goals of care: Likely need prolonged hospitalization and LTAC placement.   Best practice:  Diet: continue tube feeding Pain/Anxiety/Delirium protocol (if indicated): as above VAP protocol (if indicated): yes DVT prophylaxis: lovenox 40mg  daily GI prophylaxis: PPI Glucose control:  Lantus+ SSI Mobility: needs mobility attempts Code Status: full Family Communication: I called and updated wife.   Disposition: remain in ICU  Labs   CBC: Recent Labs  Lab 12/10/19 0541 12/11/19 0356 12/12/19 0425 12/13/19 0447 12/14/19 0436  WBC 8.0 7.5 7.8 9.3 10.8*  NEUTROABS  --   --  4.0 4.5 5.6  HGB 8.6* 8.3* 8.5* 8.3* 9.2*  HCT 28.6* 26.7* 27.3* 26.9* 29.4*  MCV 96.6 95.4 94.5 93.4 92.2  PLT 283 272 306 290 263    Basic Metabolic Panel: Recent Labs  Lab 12/10/19 0541 12/11/19 0356 12/12/19 0425 12/13/19 0447 12/14/19 0436  NA 141 140 137 139 136  K 3.9 4.1 4.3 3.6 3.8  CL 98 98 100 95* 91*  CO2 35* 34* 34* 36* 32  GLUCOSE 203* 171* 140* 190* 156*  BUN 23 21 17 18 16   CREATININE 0.45* 0.36* 0.35* 0.32* 0.41*  CALCIUM 8.3* 8.1* 7.6* 8.3* 8.3*   GFR: Estimated Creatinine Clearance: 97.5 mL/min (A) (by C-G formula based on SCr of 0.41 mg/dL (L)). Recent Labs  Lab 12/11/19 0356 12/12/19 0425 12/13/19 0447 12/14/19 0436  WBC 7.5 7.8 9.3 10.8*    Liver Function Tests: Recent Labs  Lab 12/09/19 0534 12/10/19 0541 12/13/19 0447 12/14/19 0436  AST 20 18 18 20   ALT 17 17 14 13   ALKPHOS 61 54 51 57  BILITOT 0.4 0.6 0.5 0.6  PROT 5.7* 5.5* 5.6* 6.3*  ALBUMIN 1.6* 1.6* 1.5* 1.7*   No results for input(s): LIPASE, AMYLASE in the last 168 hours. No results for input(s): AMMONIA in the last  168 hours.  ABG    Component Value Date/Time   PHART 7.448 11/17/2019 0519   PCO2ART 54.0 (H) 11/17/2019 0519   PO2ART 90.0 11/17/2019 0519   HCO3 37.2 (H) 11/17/2019 0519   TCO2 39 (H) 11/17/2019 0519   ACIDBASEDEF 1.0 11/11/2019 1529   O2SAT 97.0 11/17/2019 0519     Coagulation Profile: No results for input(s): INR, PROTIME in the last 168 hours.  Cardiac Enzymes: No results for input(s): CKTOTAL, CKMB, CKMBINDEX, TROPONINI in the last 168 hours.  HbA1C: Hgb A1c MFr Bld  Date/Time Value Ref Range Status  11/14/2019 06:51 PM 7.5 (H) 4.8 - 5.6 % Final     Comment:    (NOTE) Pre diabetes:          5.7%-6.4% Diabetes:              >6.4% Glycemic control for   <7.0% adults with diabetes   12/19/2018 02:21 PM 7.7 (H) 4.6 - 6.5 % Final    Comment:    Glycemic Control Guidelines for People with Diabetes:Non Diabetic:  <6%Goal of Therapy: <7%Additional Action Suggested:  >8%     CBG: Recent Labs  Lab 12/13/19 1528 12/13/19 1939 12/13/19 2310 12/14/19 0301 12/14/19 0750  GLUCAP 178* 147* 127* 157* 224*    Josephine Igo, DO [facility] 12/14/2019 9:39 AM

## 2019-12-15 DIAGNOSIS — G9340 Encephalopathy, unspecified: Secondary | ICD-10-CM | POA: Diagnosis not present

## 2019-12-15 DIAGNOSIS — R0603 Acute respiratory distress: Secondary | ICD-10-CM | POA: Diagnosis not present

## 2019-12-15 DIAGNOSIS — J9601 Acute respiratory failure with hypoxia: Secondary | ICD-10-CM | POA: Diagnosis not present

## 2019-12-15 LAB — BASIC METABOLIC PANEL
Anion gap: 11 (ref 5–15)
BUN: 22 mg/dL (ref 8–23)
CO2: 39 mmol/L — ABNORMAL HIGH (ref 22–32)
Calcium: 8.8 mg/dL — ABNORMAL LOW (ref 8.9–10.3)
Chloride: 89 mmol/L — ABNORMAL LOW (ref 98–111)
Creatinine, Ser: 0.49 mg/dL — ABNORMAL LOW (ref 0.61–1.24)
GFR calc Af Amer: >60 mL/min (ref >60)
GFR calc non Af Amer: >60 mL/min (ref >60)
Glucose, Bld: 221 mg/dL — ABNORMAL HIGH (ref 70–99)
Potassium: 3.6 mmol/L (ref 3.5–5.1)
Sodium: 139 mmol/L (ref 135–145)

## 2019-12-15 LAB — GLUCOSE, CAPILLARY
Glucose-Capillary: 156 mg/dL — ABNORMAL HIGH (ref 70–99)
Glucose-Capillary: 164 mg/dL — ABNORMAL HIGH (ref 70–99)
Glucose-Capillary: 165 mg/dL — ABNORMAL HIGH (ref 70–99)
Glucose-Capillary: 175 mg/dL — ABNORMAL HIGH (ref 70–99)
Glucose-Capillary: 192 mg/dL — ABNORMAL HIGH (ref 70–99)
Glucose-Capillary: 211 mg/dL — ABNORMAL HIGH (ref 70–99)

## 2019-12-15 LAB — CBC
HCT: 28.4 % — ABNORMAL LOW (ref 39.0–52.0)
Hemoglobin: 8.6 g/dL — ABNORMAL LOW (ref 13.0–17.0)
MCH: 28.2 pg (ref 26.0–34.0)
MCHC: 30.3 g/dL (ref 30.0–36.0)
MCV: 93.1 fL (ref 80.0–100.0)
Platelets: 337 10*3/uL (ref 150–400)
RBC: 3.05 MIL/uL — ABNORMAL LOW (ref 4.22–5.81)
RDW: 15 % (ref 11.5–15.5)
WBC: 11.7 10*3/uL — ABNORMAL HIGH (ref 4.0–10.5)
nRBC: 0.2 % (ref 0.0–0.2)

## 2019-12-15 LAB — MAGNESIUM: Magnesium: 2.1 mg/dL (ref 1.7–2.4)

## 2019-12-15 MED ORDER — COLLAGENASE 250 UNIT/GM EX OINT
TOPICAL_OINTMENT | Freq: Every day | CUTANEOUS | Status: DC
  Filled 2019-12-15 (×2): qty 30

## 2019-12-15 MED ORDER — CHLORHEXIDINE GLUCONATE CLOTH 2 % EX PADS
6.0000 | MEDICATED_PAD | Freq: Every day | CUTANEOUS | Status: DC
Start: 2019-12-17 — End: 2019-12-15

## 2019-12-15 MED ORDER — CLONAZEPAM 1 MG PO TABS
1.0000 mg | ORAL_TABLET | Freq: Three times a day (TID) | ORAL | Status: DC
  Administered 2019-12-15 (×2): 1 mg
  Filled 2019-12-15 (×2): qty 1

## 2019-12-15 MED ORDER — POTASSIUM CHLORIDE 20 MEQ/15ML (10%) PO SOLN
40.0000 meq | Freq: Four times a day (QID) | ORAL | Status: AC
  Administered 2019-12-15 (×2): 40 meq via ORAL
  Filled 2019-12-15 (×2): qty 30

## 2019-12-15 MED ORDER — GABAPENTIN 250 MG/5ML PO SOLN
200.0000 mg | Freq: Three times a day (TID) | ORAL | Status: DC
  Administered 2019-12-15 – 2020-01-07 (×68): 200 mg
  Filled 2019-12-15 (×76): qty 4

## 2019-12-15 MED ORDER — CHLORHEXIDINE GLUCONATE CLOTH 2 % EX PADS
6.0000 | MEDICATED_PAD | Freq: Every day | CUTANEOUS | Status: DC
  Administered 2019-12-15 – 2020-01-07 (×23): 6 via TOPICAL

## 2019-12-15 MED ORDER — OXYCODONE HCL 5 MG PO TABS
5.0000 mg | ORAL_TABLET | Freq: Four times a day (QID) | ORAL | Status: AC
  Administered 2019-12-15 – 2019-12-16 (×3): 5 mg
  Filled 2019-12-15 (×3): qty 1

## 2019-12-15 MED ORDER — FUROSEMIDE 10 MG/ML IJ SOLN
40.0000 mg | Freq: Four times a day (QID) | INTRAMUSCULAR | Status: AC
  Administered 2019-12-15 – 2019-12-16 (×3): 40 mg via INTRAVENOUS
  Filled 2019-12-15 (×3): qty 4

## 2019-12-15 NOTE — Progress Notes (Signed)
NAME:  [name], MRN:  [id number], DOB:  April 19, 1956, LOS: 37 ADMISSION DATE:  11/26/19, CONSULTATION DATE:  4/5 REFERRING MD:  Jerral Leondro, CHIEF COMPLAINT:  Dyspnea   Brief History   64 year old man admitted 4/2 with COVID pneumonia and delirium after 1 week of symptoms.  Intubated on 4/5, required prolonged mechanical ventilatory support.  Tracheostomy performed on 4/20  Past Medical History    Allergic rhinitis   . Arthritis of left acromioclavicular joint 07/25/2014  . Asthma   . Chronic bronchitis (HCC)   . Chronic kidney disease    H/O STONES  . Colon polyp   . Coronary artery disease   . Diabetes mellitus without complication (HCC)   . Diverticula of colon   . GERD (gastroesophageal reflux disease)   . Hx of heart bypass surgery 2005  . Hypercholesteremia   . Impingement syndrome of left shoulder 07/25/2014  . Laceration of brachial artery 03/11/2016   left arm  . Lumbar disc disease   . Partial nontraumatic rupture of right rotator cuff 10/20/2016    Significant Hospital Events   4/19: tachypneic overnight and this am. Unable to wean sedation. Tracheostomy done  4/20: successful trach yesterday but de-recruited and on some escalated settings today. Remains tachypneic this am. Hypoglycemic yesterday and lantus changed 4/22: started on empiric abx but resp cx with abundant gpc. Improving oxygenation requirement but remains with rass -4 despite light sedation.  4/23:mucous plugging overnight and this am with copious/thick secretions.  4/28: issues overnight with increased vent settings. Still without purposeful responses. Fever overnight as well to 101.1. remains minimally responsive and unable to wean iv sedation except for short periods, despite increase in po agents.  4/29: somewhat improved settings overnight on 60/8. Still with temps overnight to 100.7 5/1: Continued improvement in vent requirement 50/8, remains on several sedating medications including  continuous fentanyl and precedex. Trach changed to 6.0 shiley XLT. 5/7 tolerating pressure support ventilation while on sedation  Consults:    Procedures:  04/05 Rt CVC > 4/24 04/16 Bronchoscopy  04/20Tracheostomy 4/24 Rt PICC  Significant Diagnostic Tests:  4/22 CXR: 1. Lines and tubes stable position. 2. Persistent bibasilar pulmonary infiltrates/edema with interim slight clearing from prior exam. 3. Prior CABG. Heart size stable.  4/24 CXR: Slight increase in interstitial and airspace opacities particularly in the lower lobes. Findings suspicious for worsening asymmetric edema or infection.  Head CT 4/27 Mild chronic ischemic white matter disease. No acute intracranial abnormality seen.  Abdominal CT 4/27 > for PEG, trace pneumoperitoneum, extensive airspace disease due to COVID 19, kidney stone left, non-obstructing  MRI brain 5/3 > NAICP, chronic small vessel ischemia, chronic microhemorrhages, stable mild generalized atrophy, ethmoid sinus thickening, bilateral mastoid effusions   Micro Data:  COVID 4/2 >Positive  Blood cultures 4/2 > negative  Respiratory culture 4/5 > Negative  Respiratory culture 4/12 > Normal flora  Respiratory culture 4/21 > Normal flora   Antimicrobials:  4/21 Vancomycin > 4/23 4/21 Zosyn > 4/26  Interim history/subjective:   No issues overnight.  Tracheostomy tube in place remains on mechanical support.  Was successful trach collar trial for short period yesterday.  Slowly wean again.  In pressure support  Objective   Blood pressure 112/68, pulse 91, temperature 99.1 F (37.3 C), temperature source Axillary, resp. rate 19, height 5\' 8"  (1.727 m), weight 81.9 kg, SpO2 98 %.    Vent Mode: PRVC FiO2 (%):  [40 %] 40 % Set Rate:  [20 bmp] 20  bmp Vt Set:  [480 mL] 480 mL PEEP:  [8 cmH20] 8 cmH20 Pressure Support:  [8 cmH20] 8 cmH20 Plateau Pressure:  [21 cmH20-31 cmH20] 21 cmH20   Intake/Output Summary (Last 24 hours) at  12/15/2019 1509 Last data filed at 12/15/2019 1300 Gross per 24 hour  Intake 3232.99 ml  Output 3640 ml  Net -407.01 ml   Filed Weights   12/13/19 0402 12/14/19 0306 12/15/19 0128  Weight: 79.8 kg 84.7 kg 81.9 kg    Examination: General: Remains on mechanical support pressure support trial. HENT: NCAT, tracheostomy tube in place NG tube in place PULM: Clear to auscultation bilaterally CV: Regular rate rhythm S1-S2 GI: Soft, nontender nondistended MSK: Normal bulk and tone Neuro: Continue to decrease sedation as much as tolerated.   Resolved Hospital Problem list   COVID-19 pneumonia -Has completed full course of remdesivir and dexamethasone, off isolation 4/23.   Assessment & Plan:   Acute respiratory failure with hypoxemia due to COVID 19  Severe respiratory muscle weakness HCAP> resolved Pneumomediastinum/pneumopericardium > improving Requiring prolonged mechanical wean from the later. Diuresis to maintain euvolemia and overall was able to tolerate pressure support 5/5 this morning 40% FiO2.  Appears much more comfortable than he did yesterday. I think we can continue to give him short periods of this. Additionally his mental status slowly improving.  Acute toxic metabolic encephalopathy: MRI brain with some chronic changes but no large cerebral injury, EEG without epileptiform activity Suspect oxycodone intolerance, transitioning to fentanyl patch Severe ICU myopathy On multiple neurologic and centrally acting drugs Transitioning from oxycodone to fentanyl patches Overall mental status is slowly improving. He is currently on clonazepam, trazodone and Seroquel Gabapentin 3 times a day. I wonder if we should start scaling some of these medications back to see if we can help clear his sensorium even more.  Positive Cumulative Fluid Balance  Additional diuresis today Continue to follow urine output Repeat electrolytes as needed. Preemptively give him some  potassium  History of asthma Albuterol as needed  DM2 with hyperglycemia Lantus, SSI plus CBGs  Urinary retention Doxazosin continued  Goals of care: Possible need for LTAC placement   Best practice:  Diet: continue tube feeding Pain/Anxiety/Delirium protocol (if indicated): as above VAP protocol (if indicated): yes DVT prophylaxis: lovenox 40mg  daily GI prophylaxis: PPI Glucose control: Lantus+ SSI Mobility: needs mobility attempts Code Status: full Family Communication: Wife has been updated Disposition: remain in ICU  Labs   CBC: Recent Labs  Lab 12/11/19 0356 12/12/19 0425 12/13/19 0447 12/14/19 0436 12/15/19 0421  WBC 7.5 7.8 9.3 10.8* 11.7*  NEUTROABS  --  4.0 4.5 5.6  --   HGB 8.3* 8.5* 8.3* 9.2* 8.6*  HCT 26.7* 27.3* 26.9* 29.4* 28.4*  MCV 95.4 94.5 93.4 92.2 93.1  PLT 272 306 290 315 956    Basic Metabolic Panel: Recent Labs  Lab 12/12/19 0425 12/13/19 0447 12/14/19 0436 12/14/19 1720 12/15/19 0421  NA 137 139 136 135 139  K 4.3 3.6 3.8 4.6 3.6  CL 100 95* 91* 91* 89*  CO2 34* 36* 32 33* 39*  GLUCOSE 140* 190* 156* 226* 221*  BUN 17 18 16 18 22   CREATININE 0.35* 0.32* 0.41* 0.48* 0.49*  CALCIUM 7.6* 8.3* 8.3* 8.6* 8.8*  MG  --   --   --   --  2.1   GFR: Estimated Creatinine Clearance: 89.1 mL/min (A) (by C-G formula based on SCr of 0.49 mg/dL (L)). Recent Labs  Lab 12/12/19 0425 12/13/19 0447  12/14/19 0436 12/15/19 0421  WBC 7.8 9.3 10.8* 11.7*    Liver Function Tests: Recent Labs  Lab 12/09/19 0534 12/10/19 0541 12/13/19 0447 12/14/19 0436  AST 20 18 18 20   ALT 17 17 14 13   ALKPHOS 61 54 51 57  BILITOT 0.4 0.6 0.5 0.6  PROT 5.7* 5.5* 5.6* 6.3*  ALBUMIN 1.6* 1.6* 1.5* 1.7*   No results for input(s): LIPASE, AMYLASE in the last 168 hours. No results for input(s): AMMONIA in the last 168 hours.  ABG    Component Value Date/Time   PHART 7.448 11/17/2019 0519   PCO2ART 54.0 (H) 11/17/2019 0519   PO2ART 90.0 11/17/2019  0519   HCO3 37.2 (H) 11/17/2019 0519   TCO2 39 (H) 11/17/2019 0519   ACIDBASEDEF 1.0 11/11/2019 1529   O2SAT 97.0 11/17/2019 0519     Coagulation Profile: No results for input(s): INR, PROTIME in the last 168 hours.  Cardiac Enzymes: No results for input(s): CKTOTAL, CKMB, CKMBINDEX, TROPONINI in the last 168 hours.  HbA1C: Hgb A1c MFr Bld  Date/Time Value Ref Range Status  11/24/2019 06:51 PM 7.5 (H) 4.8 - 5.6 % Final    Comment:    (NOTE) Pre diabetes:          5.7%-6.4% Diabetes:              >6.4% Glycemic control for   <7.0% adults with diabetes   12/19/2018 02:21 PM 7.7 (H) 4.6 - 6.5 % Final    Comment:    Glycemic Control Guidelines for People with Diabetes:Non Diabetic:  <6%Goal of Therapy: <7%Additional Action Suggested:  >8%     CBG: Recent Labs  Lab 12/14/19 2014 12/15/19 0019 12/15/19 0421 12/15/19 0752 12/15/19 1152  GLUCAP 197* 211* 192* 175* 156*    02/14/20, DO [facility] 12/15/2019 3:09 PM

## 2019-12-16 ENCOUNTER — Inpatient Hospital Stay (HOSPITAL_COMMUNITY): Payer: Medicare Other

## 2019-12-16 LAB — BASIC METABOLIC PANEL
Anion gap: 11 (ref 5–15)
BUN: 25 mg/dL — ABNORMAL HIGH (ref 8–23)
CO2: 38 mmol/L — ABNORMAL HIGH (ref 22–32)
Calcium: 8.7 mg/dL — ABNORMAL LOW (ref 8.9–10.3)
Chloride: 90 mmol/L — ABNORMAL LOW (ref 98–111)
Creatinine, Ser: 0.5 mg/dL — ABNORMAL LOW (ref 0.61–1.24)
GFR calc Af Amer: >60 mL/min (ref >60)
GFR calc non Af Amer: >60 mL/min (ref >60)
Glucose, Bld: 158 mg/dL — ABNORMAL HIGH (ref 70–99)
Potassium: 4.3 mmol/L (ref 3.5–5.1)
Sodium: 139 mmol/L (ref 135–145)

## 2019-12-16 LAB — PATHOLOGIST SMEAR REVIEW

## 2019-12-16 LAB — GLUCOSE, CAPILLARY
Glucose-Capillary: 137 mg/dL — ABNORMAL HIGH (ref 70–99)
Glucose-Capillary: 144 mg/dL — ABNORMAL HIGH (ref 70–99)
Glucose-Capillary: 149 mg/dL — ABNORMAL HIGH (ref 70–99)
Glucose-Capillary: 155 mg/dL — ABNORMAL HIGH (ref 70–99)
Glucose-Capillary: 166 mg/dL — ABNORMAL HIGH (ref 70–99)
Glucose-Capillary: 172 mg/dL — ABNORMAL HIGH (ref 70–99)
Glucose-Capillary: 221 mg/dL — ABNORMAL HIGH (ref 70–99)

## 2019-12-16 MED ORDER — CLONAZEPAM 1 MG PO TABS
1.0000 mg | ORAL_TABLET | Freq: Two times a day (BID) | ORAL | Status: DC
  Administered 2019-12-16: 1 mg
  Filled 2019-12-16: qty 1

## 2019-12-16 MED ORDER — POTASSIUM CHLORIDE 20 MEQ/15ML (10%) PO SOLN
40.0000 meq | Freq: Once | ORAL | Status: AC
  Administered 2019-12-16: 40 meq via ORAL
  Filled 2019-12-16: qty 30

## 2019-12-16 MED ORDER — DOXAZOSIN MESYLATE 1 MG PO TABS
1.0000 mg | ORAL_TABLET | Freq: Every day | ORAL | Status: DC
  Administered 2019-12-17 – 2019-12-21 (×4): 1 mg
  Filled 2019-12-16 (×6): qty 1

## 2019-12-16 MED ORDER — QUETIAPINE FUMARATE 50 MG PO TABS
200.0000 mg | ORAL_TABLET | Freq: Two times a day (BID) | ORAL | Status: DC
  Administered 2019-12-16 – 2019-12-23 (×15): 200 mg
  Filled 2019-12-16 (×15): qty 4

## 2019-12-16 MED ORDER — FUROSEMIDE 10 MG/ML IJ SOLN
40.0000 mg | INTRAMUSCULAR | Status: AC
  Administered 2019-12-16 (×2): 40 mg via INTRAVENOUS
  Filled 2019-12-16 (×2): qty 4

## 2019-12-16 MED ORDER — QUETIAPINE FUMARATE 50 MG PO TABS
100.0000 mg | ORAL_TABLET | Freq: Two times a day (BID) | ORAL | Status: DC
  Administered 2019-12-16: 100 mg
  Filled 2019-12-16: qty 2

## 2019-12-16 MED ORDER — MIDAZOLAM HCL 2 MG/2ML IJ SOLN
INTRAMUSCULAR | Status: AC
  Filled 2019-12-16: qty 2

## 2019-12-16 MED ORDER — CLONAZEPAM 1 MG PO TABS
1.0000 mg | ORAL_TABLET | Freq: Three times a day (TID) | ORAL | Status: DC
  Administered 2019-12-16 – 2019-12-23 (×23): 1 mg
  Filled 2019-12-16 (×23): qty 1

## 2019-12-16 MED ORDER — MIDAZOLAM HCL 2 MG/2ML IJ SOLN
2.0000 mg | Freq: Once | INTRAMUSCULAR | Status: AC
  Administered 2019-12-16: 2 mg via INTRAVENOUS

## 2019-12-16 MED ORDER — FENTANYL 75 MCG/HR TD PT72
1.0000 | MEDICATED_PATCH | TRANSDERMAL | Status: DC
  Administered 2019-12-16: 1 via TRANSDERMAL
  Filled 2019-12-16: qty 1

## 2019-12-16 MED ORDER — ACETAZOLAMIDE 250 MG PO TABS
500.0000 mg | ORAL_TABLET | Freq: Once | ORAL | Status: AC
  Administered 2019-12-16: 500 mg
  Filled 2019-12-16 (×2): qty 2

## 2019-12-16 MED ORDER — ACETAZOLAMIDE 250 MG PO TABS
500.0000 mg | ORAL_TABLET | Freq: Once | ORAL | Status: DC
  Filled 2019-12-16: qty 2

## 2019-12-16 MED ORDER — QUETIAPINE FUMARATE 50 MG PO TABS
100.0000 mg | ORAL_TABLET | ORAL | Status: AC
  Administered 2019-12-16: 100 mg
  Filled 2019-12-16: qty 2

## 2019-12-16 NOTE — Progress Notes (Signed)
eLink Physician-Brief Progress Note Patient Name: [name] DOB: 1955/12/14 MRN: [id number]   Date of Service  12/16/2019  HPI/Events of Note  Hypoxia - Recent onset of hypoxia and sinus tachycardia. Requiring increase of FiO2 from 40% to 100% and PEEP from 5 to 10. RR = 35-45.   eICU Interventions  Will order: 1. Portable CXR STAT. 2. ABG STAT. 3. Will ask ground team to evaluate the patient at bedside.      Intervention Category Major Interventions: Hypoxemia - evaluation and management;Respiratory failure - evaluation and management  Lenell Antu 12/16/2019, 11:45 PM

## 2019-12-16 NOTE — Progress Notes (Signed)
NAME:  [name], MRN:  [id number], DOB:  1955-11-15, LOS: 38 ADMISSION DATE:  11/23/2019, CONSULTATION DATE:  4/5 REFERRING MD:  Jerral Suleman, CHIEF COMPLAINT:  Dyspnea   Brief History   63 year old man admitted 4/2 with COVID pneumonia and delirium after 1 week of symptoms.  Intubated on 4/5, required prolonged mechanical ventilatory support.  Tracheostomy performed on 4/20  Past Medical History    Allergic rhinitis   . Arthritis of left acromioclavicular joint 07/25/2014  . Asthma   . Chronic bronchitis (HCC)   . Chronic kidney disease    H/O STONES  . Colon polyp   . Coronary artery disease   . Diabetes mellitus without complication (HCC)   . Diverticula of colon   . GERD (gastroesophageal reflux disease)   . Hx of heart bypass surgery 2005  . Hypercholesteremia   . Impingement syndrome of left shoulder 07/25/2014  . Laceration of brachial artery 03/11/2016   left arm  . Lumbar disc disease   . Partial nontraumatic rupture of right rotator cuff 10/20/2016    Significant Hospital Events   4/19: tachypneic overnight and this am. Unable to wean sedation. Tracheostomy done  4/20: successful trach yesterday but de-recruited and on some escalated settings today. Remains tachypneic this am. Hypoglycemic yesterday and lantus changed 4/22: started on empiric abx but resp cx with abundant gpc. Improving oxygenation requirement but remains with rass -4 despite light sedation.  4/23:mucous plugging overnight and this am with copious/thick secretions.  4/28: issues overnight with increased vent settings. Still without purposeful responses. Fever overnight as well to 101.1. remains minimally responsive and unable to wean iv sedation except for short periods, despite increase in po agents.  4/29: somewhat improved settings overnight on 60/8. Still with temps overnight to 100.7 5/1: Continued improvement in vent requirement 50/8, remains on several sedating medications including  continuous fentanyl and precedex. Trach changed to 6.0 shiley XLT. 5/7 tolerating pressure support ventilation while on sedation  Consults:    Procedures:  04/05 Rt CVC > 4/24 04/16 Bronchoscopy  04/20Tracheostomy 4/24 Rt PICC  Significant Diagnostic Tests:  4/22 CXR: 1. Lines and tubes stable position. 2. Persistent bibasilar pulmonary infiltrates/edema with interim slight clearing from prior exam. 3. Prior CABG. Heart size stable.  4/24 CXR: Slight increase in interstitial and airspace opacities particularly in the lower lobes. Findings suspicious for worsening asymmetric edema or infection.  Head CT 4/27 Mild chronic ischemic white matter disease. No acute intracranial abnormality seen.  Abdominal CT 4/27 > for PEG, trace pneumoperitoneum, extensive airspace disease due to COVID 19, kidney stone left, non-obstructing  MRI brain 5/3 > NAICP, chronic small vessel ischemia, chronic microhemorrhages, stable mild generalized atrophy, ethmoid sinus thickening, bilateral mastoid effusions  Micro Data:  COVID 4/2 >Positive  Blood cultures 4/2 > negative  Respiratory culture 4/5 > Negative  Respiratory culture 4/12 > Normal flora  Respiratory culture 4/21 > Normal flora   Antimicrobials:  4/21 Vancomycin > 4/23 4/21 Zosyn > 4/26  Interim history/subjective:  No reported issues overnight, Remains on mechanical ventilation through trach. Did not tolerated wean trial this AM.  Objective   Blood pressure (!) 94/53, pulse 64, temperature 99.5 F (37.5 C), temperature source Oral, resp. rate 20, height 5\' 8"  (1.727 m), weight 78.1 kg, SpO2 98 %.    Vent Mode: PRVC FiO2 (%):  [40 %] 40 % Set Rate:  [20 bmp] 20 bmp Vt Set:  [480 mL] 480 mL PEEP:  [5 cmH20-8 cmH20] 5  cmH20 Pressure Support:  [8 [place] Pressure:  [19 cmH20-34 cmH20] 22 cmH20   Intake/Output Summary (Last 24 hours) at 12/16/2019 0747 Last data filed at 12/16/2019 0700 Gross per 24 hour   Intake 3471.76 ml  Output 4265 ml  Net -793.24 ml   Filed Weights   12/14/19 0306 12/15/19 0128 12/16/19 0300  Weight: 84.7 kg 81.9 kg 78.1 kg    Examination: General: Chronically ill appearing elderly very deconditioed male on mechanical ventilation through trach, in NAD HEENT: [place]/AT, #6 shiley cuffed trach in place, MM pink/moist, PERRL, NG tube in place Neuro: Grimace to painful stimuli, unable to follow any commands CV: s1s2 regular rate and rhythm, no murmur, rubs, or gallops,  PULM:  Clear to ascultation, no added breath sounds, very tachypenic with periods of hypoxic during wean trial  GI: soft, bowel sounds active in all 4 quadrants, non-tender, non-distended, tolerating TF Extremities: warm/dry, no edema  Skin: no rashes or lesions  Resolved Hospital Problem list   COVID-19 pneumonia -Has completed full course of remdesivir and dexamethasone, off isolation 4/23.   Assessment & Plan:   Acute respiratory failure with hypoxemia due to COVID 19  Severe respiratory muscle weakness HCAP> resolved Pneumomediastinum/pneumopericardium > improving -Requiring prolonged mechanical wean  P: Diurese again today with dose of Diamox as well  Did not tolerate wean this am due to significant tachypnea with resultant hypoxia  Continue to wean sedation medications as able  Continue ventilator support with lung protective strategies, goal wean to ATC as able  Wean PEEP and FiO2 for sats greater than 90%. Head of bed elevated 30 degrees. Ensure adequate pulmonary hygiene  VAP bundle in place  PAD protocol  Acute toxic metabolic encephalopathy -MRI brain with some chronic changes but no large cerebral injury, EEG without epileptiform activity Suspect oxycodone intolerance, transitioning to fentanyl patch Severe ICU myopathy On multiple neurologic and centrally acting drugs P: Increase Fentanyl patch today to facilitate removal of IV drip  Mentation continues to wax and wane   Continue clonazepam trazodone and gabapentin at current dosing Decrease Seroquel and klonopin dosing today   Positive Cumulative Fluid Balance  -Continue to remains ~10L positive  P: Diurese again today with Lasix  Add dose of Diamox  Trend Bmet q48hrs Supplement electrolytes as needed   History of asthma P: Albuterol as needed  DM2 with hyperglycemia -CBG seem well controled on current regiment  P: Continue moderate resistant SSI Continue Lantus dosing  Continue TF coverage   Urinary retention -External catheter in place  P: Continue Doxazosin   Goals of care:  Possible need for LTAC placement   Best practice:  Diet: continue tube feeding Pain/Anxiety/Delirium protocol (if indicated): as above VAP protocol (if indicated): yes DVT prophylaxis: lovenox 40mg  daily GI prophylaxis: PPI Glucose control: Lantus+ SSI Mobility: needs mobility attempts Code Status: full Family Communication: Wife has been updated Disposition: remain in ICU  Labs   CBC: Recent Labs  Lab 12/11/19 0356 12/12/19 0425 12/13/19 0447 12/14/19 0436 12/15/19 0421  WBC 7.5 7.8 9.3 10.8* 11.7*  NEUTROABS  --  4.0 4.5 5.6  --   HGB 8.3* 8.5* 8.3* 9.2* 8.6*  HCT 26.7* 27.3* 26.9* 29.4* 28.4*  MCV 95.4 94.5 93.4 92.2 93.1  PLT 272 306 290 315 478    Basic Metabolic Panel: Recent Labs  Lab 12/12/19 0425 12/13/19 0447 12/14/19 0436 12/14/19 1720 12/15/19 0421  NA 137 139 136 135 139  K 4.3 3.6 3.8 4.6 3.6  CL 100 95* 91* 91* 89*  CO2 34* 36* 32 33* 39*  GLUCOSE 140* 190* 156* 226* 221*  BUN 17 18 16 18 22   CREATININE 0.35* 0.32* 0.41* 0.48* 0.49*  CALCIUM 7.6* 8.3* 8.3* 8.6* 8.8*  MG  --   --   --   --  2.1   GFR: Estimated Creatinine Clearance: 89.1 mL/min (A) (by C-G formula based on SCr of 0.49 mg/dL (L)). Recent Labs  Lab 12/12/19 0425 12/13/19 0447 12/14/19 0436 12/15/19 0421  WBC 7.8 9.3 10.8* 11.7*    Liver Function Tests: Recent Labs  Lab 12/10/19 0541  12/13/19 0447 12/14/19 0436  AST 18 18 20   ALT 17 14 13   ALKPHOS 54 51 57  BILITOT 0.6 0.5 0.6  PROT 5.5* 5.6* 6.3*  ALBUMIN 1.6* 1.5* 1.7*   No results for input(s): LIPASE, AMYLASE in the last 168 hours. No results for input(s): AMMONIA in the last 168 hours.  ABG    Component Value Date/Time   PHART 7.448 11/17/2019 0519   PCO2ART 54.0 (H) 11/17/2019 0519   PO2ART 90.0 11/17/2019 0519   HCO3 37.2 (H) 11/17/2019 0519   TCO2 39 (H) 11/17/2019 0519   ACIDBASEDEF 1.0 11/11/2019 1529   O2SAT 97.0 11/17/2019 0519     Coagulation Profile: No results for input(s): INR, PROTIME in the last 168 hours.  Cardiac Enzymes: No results for input(s): CKTOTAL, CKMB, CKMBINDEX, TROPONINI in the last 168 hours.  HbA1C: Hgb A1c MFr Bld  Date/Time Value Ref Range Status  12/02/2019 06:51 PM 7.5 (H) 4.8 - 5.6 % Final    Comment:    (NOTE) Pre diabetes:          5.7%-6.4% Diabetes:              >6.4% Glycemic control for   <7.0% adults with diabetes   12/19/2018 02:21 PM 7.7 (H) 4.6 - 6.5 % Final    Comment:    Glycemic Control Guidelines for People with Diabetes:Non Diabetic:  <6%Goal of Therapy: <7%Additional Action Suggested:  >8%     CBG: Recent Labs  Lab 12/15/19 1152 12/15/19 1547 12/15/19 2009 12/16/19 0016 12/16/19 0441  GLUCAP 156* 165* 164* 155* 137*    CRITICAL CARE Performed by: 02/14/20  Total critical care time: 39 minutes  Critical care time was exclusive of separately billable procedures and treating other patients.  Critical care was necessary to treat or prevent imminent or life-threatening deterioration.  Critical care was time spent personally by me on the following activities: development of treatment plan with patient and/or surrogate as well as nursing, discussions with consultants, evaluation of patient's response to treatment, examination of patient, obtaining history from patient or surrogate, ordering and performing treatments and  interventions, ordering and review of laboratory studies, ordering and review of radiographic studies, pulse oximetry and re-evaluation of patient's condition.  02/15/20, NP-C [facility] Contact / Pager information can be found on Amion  12/16/2019, 10:04 AM

## 2019-12-17 ENCOUNTER — Inpatient Hospital Stay (HOSPITAL_COMMUNITY): Payer: Medicare Other

## 2019-12-17 LAB — GLUCOSE, CAPILLARY
Glucose-Capillary: 196 mg/dL — ABNORMAL HIGH (ref 70–99)
Glucose-Capillary: 216 mg/dL — ABNORMAL HIGH (ref 70–99)
Glucose-Capillary: 202 mg/dL — ABNORMAL HIGH (ref 70–99)
Glucose-Capillary: 113 mg/dL — ABNORMAL HIGH (ref 70–99)
Glucose-Capillary: 152 mg/dL — ABNORMAL HIGH (ref 70–99)

## 2019-12-17 LAB — CBC
HCT: 29.7 % — ABNORMAL LOW (ref 39.0–52.0)
Hemoglobin: 9.1 g/dL — ABNORMAL LOW (ref 13.0–17.0)
MCH: 28.7 pg (ref 26.0–34.0)
MCHC: 30.6 g/dL (ref 30.0–36.0)
MCV: 93.7 fL (ref 80.0–100.0)
Platelets: 395 10*3/uL (ref 150–400)
RBC: 3.17 MIL/uL — ABNORMAL LOW (ref 4.22–5.81)
RDW: 15 % (ref 11.5–15.5)
WBC: 14.7 10*3/uL — ABNORMAL HIGH (ref 4.0–10.5)
nRBC: 0 % (ref 0.0–0.2)

## 2019-12-17 LAB — BASIC METABOLIC PANEL
Anion gap: 11 (ref 5–15)
BUN: 34 mg/dL — ABNORMAL HIGH (ref 8–23)
CO2: 37 mmol/L — ABNORMAL HIGH (ref 22–32)
Calcium: 8.9 mg/dL (ref 8.9–10.3)
Chloride: 91 mmol/L — ABNORMAL LOW (ref 98–111)
Creatinine, Ser: 0.51 mg/dL — ABNORMAL LOW (ref 0.61–1.24)
GFR calc Af Amer: >60 mL/min (ref >60)
GFR calc non Af Amer: >60 mL/min (ref >60)
Glucose, Bld: 209 mg/dL — ABNORMAL HIGH (ref 70–99)
Potassium: 3.6 mmol/L (ref 3.5–5.1)
Sodium: 139 mmol/L (ref 135–145)

## 2019-12-17 LAB — POCT I-STAT 7, (LYTES, BLD GAS, ICA,H+H)
Acid-Base Excess: 17 mmol/L — ABNORMAL HIGH (ref 0.0–2.0)
Bicarbonate: 42.6 mmol/L — ABNORMAL HIGH (ref 20.0–28.0)
Calcium, Ion: 1.2 mmol/L (ref 1.15–1.40)
HCT: 30 % — ABNORMAL LOW (ref 39.0–52.0)
Hemoglobin: 10.2 g/dL — ABNORMAL LOW (ref 13.0–17.0)
O2 Saturation: 96 %
Patient temperature: 99.1
Potassium: 3.3 mmol/L — ABNORMAL LOW (ref 3.5–5.1)
Sodium: 136 mmol/L (ref 135–145)
TCO2: 44 mmol/L — ABNORMAL HIGH (ref 22–32)
pCO2 arterial: 58 mmHg — ABNORMAL HIGH (ref 32.0–48.0)
pH, Arterial: 7.475 — ABNORMAL HIGH (ref 7.350–7.450)
pO2, Arterial: 84 mmHg (ref 83.0–108.0)

## 2019-12-17 MED ORDER — FENTANYL CITRATE (PF) 100 MCG/2ML IJ SOLN: 100.0000 ug | Freq: Once | INTRAMUSCULAR | Status: DC

## 2019-12-17 MED ORDER — CLONIDINE HCL 0.2 MG PO TABS
0.2000 mg | ORAL_TABLET | Freq: Four times a day (QID) | ORAL | Status: DC
  Administered 2019-12-17 – 2019-12-18 (×3): 0.2 mg via ORAL
  Filled 2019-12-17 (×3): qty 1

## 2019-12-17 MED ORDER — FENTANYL BOLUS VIA INFUSION
50.0000 ug | INTRAVENOUS | Status: DC | PRN
  Administered 2019-12-17 – 2019-12-23 (×32): 50 ug via INTRAVENOUS
  Filled 2019-12-17: qty 50

## 2019-12-17 MED ORDER — MIDAZOLAM HCL 2 MG/2ML IJ SOLN: 4.0000 mg | Freq: Once | INTRAMUSCULAR | Status: DC

## 2019-12-17 MED ORDER — MIDAZOLAM HCL (PF) 5 MG/ML IJ SOLN: 4.0000 mg | Freq: Once | INTRAMUSCULAR | Status: DC

## 2019-12-17 NOTE — Progress Notes (Signed)
Inpatient Diabetes Program Recommendations  AACE/ADA: New Consensus Statement on Inpatient Glycemic Control   Target Ranges:  Prepandial:   less than 140 mg/dL      Peak postprandial:   less than 180 mg/dL (1-2 hours)      Critically ill patients:  140 - 180 mg/dL   Results for [name], [name] (MRN [id number]) as of 12/17/2019 10:33  Ref. Range 12/16/2019 08:39 12/16/2019 12:17 12/16/2019 16:58 12/16/2019 20:00 12/16/2019 23:41 12/17/2019 03:41 12/17/2019 07:43  Glucose-Capillary Latest Ref Range: 70 - 99 mg/dL 409 (H) 811 (H) 914 (H) 166 (H) 221 (H) 196 (H) 216 (H)   Review of Glycemic Control  Current orders for Inpatient glycemic control: Lantus 14 units BID, Novolog 6 units Q4H for TF, Novolog 0-15 units Q4H, Tradjenta 5 mg daily; Vital @ 65 mg/hr  Inpatient Diabetes Program Recommendations:    Insulin-Tube Feeding Coverage: Please consider increasing tube feeding coverage to Novolog 8 units Q4H.  Thanks, Orlando Penner, RN, MSN, CDE Diabetes Coordinator Inpatient Diabetes Program [phone] (Team Pager from 8am to 5pm)

## 2019-12-17 NOTE — Consult Note (Addendum)
WOC Nurse wound follow up Patient receiving care in [facility] 2M15.  Assisted with turning by primary RN, Jamesetta So Wound type: bilateral buttocks and sacral area now with unstageble wounds Measurement: entire area measures 6 cm x 7.5 cm Wound bed: right buttock discolored purple/maroon and consistent with an evolving DTPI. The left buttock is definitely an unstageable PI Drainage (amount, consistency, odor) none Periwound: intact Dressing procedure/placement/frequency: I have ordered PT hydrotherapy 6 days/ week followed by santyl. Helmut Muster, RN, MSN, CWOCN, CNS-BC, pager [phone]

## 2019-12-17 NOTE — Progress Notes (Signed)
Physical Therapy Wound Treatment Patient Details  Name: Wyndham Santilli Dinius MRN: [id number] Date of Birth: 02-Dec-1955  Today's Date: 12/17/2019 Time: 6606-3016 Time Calculation (min): 56 min  Subjective  Patient and Family Stated Goals: intubated and sedated Date of Onset: (unknown) Prior Treatments: moist gauze  Pain Score:  Pt sedated and given pain medication, no facial evidence of pain.  Wound Assessment  Pressure Injury 12/12/19 Coccyx Mid Unstageable - Full thickness tissue loss in which the base of the injury is covered by slough (yellow, tan, gray, green or brown) and/or eschar (tan, brown or black) in the wound bed. (Active)  Wound Image   12/17/19 1400  Dressing Type ABD;Moist to moist;Other (Comment);Barrier Film (skin prep);Foam - Lift dressing to assess site every shift 12/17/19 1400  Dressing Changed 12/17/19 1400  Dressing Change Frequency Daily 12/16/19 2000  State of Healing Eschar 12/17/19 1400  Site / Wound Assessment Bleeding;Black;Purple;Red;Yellow 12/17/19 1400  % Wound base Red or Granulating 15% 12/17/19 1400  % Wound base Yellow/Fibrinous Exudate 85% 12/17/19 1400  % Wound base Black/Eschar 90% 12/16/19 1000  Peri-wound Assessment Erythema (blanchable) 12/16/19 2000  Wound Length (cm) 8.5 cm 12/17/19 1400  Wound Width (cm) 7.5 cm 12/17/19 1400  Wound Surface Area (cm^2) 63.75 cm^2 12/17/19 1400  Margins Unattached edges (unapproximated) 12/17/19 1400  Drainage Amount Moderate 12/17/19 1400  Drainage Description Serosanguineous 12/17/19 1400  Treatment Hydrotherapy (Pulse lavage);Debridement (Selective) 12/17/19 1400  Santyl applied to necrotic tissue  Hydrotherapy Pulsed lavage therapy - wound location: sacral wound Pulsed Lavage with Suction (psi): 12 psi Pulsed Lavage with Suction - Normal Saline Used: 1000 mL Pulsed Lavage Tip: Tip with splash shield Selective Debridement Selective Debridement - Location: sacral wound Selective Debridement - Tools Used:  Forceps;Scalpel Selective Debridement - Tissue Removed: eschar softened by santyl and pulse lavage   Wound Assessment and Plan  Wound Therapy - Assess/Plan/Recommendations Wound Therapy - Clinical Statement: Pt presents with sacral wound with area of hardened eschar. Pt will benefit from pulse lavage and selective debridement to reduce bioburden and promote healing Wound Therapy - Functional Problem List: sedated, and immobile Factors Delaying/Impairing Wound Healing: Immobility;Incontinence;Multiple medical problems Hydrotherapy Plan: Debridement;Pulsatile lavage with suction;Patient/family education Wound Therapy - Frequency: 6X / week Wound Therapy - Follow Up Recommendations: Skilled nursing facility Wound Plan: see above  Wound Therapy Goals- Improve the function of patient's integumentary system by progressing the wound(s) through the phases of wound healing (inflammation - proliferation - remodeling) by: Decrease Necrotic Tissue to: 5 Decrease Necrotic Tissue - Progress: Goal set today Increase Granulation Tissue to: 95 Decrease Length/Width/Depth by (cm): 10% Decrease Length/Width/Depth - Progress: Goal set today Goals/treatment plan/discharge plan were made with and agreed upon by patient/family: No, Patient unable to participate in goals/treatment/discharge plan and family unavailable Time For Goal Achievement: 7 days Wound Therapy - Potential for Goals: Good  Goals will be updated until maximal potential achieved or discharge criteria met.  Discharge criteria: when goals achieved, discharge from hospital, MD decision/surgical intervention, no progress towards goals, refusal/missing three consecutive treatments without notification or medical reason.  GP   Dani Gobble. Migdalia Dk PT, DPT Acute Rehabilitation Services Pager [phone] Office [phone]   [place] 12/17/2019, 3:00 PM

## 2019-12-17 NOTE — Progress Notes (Signed)
Dr. Arsenio Loader notified that after patient was turned he became tachypneic(resp 40-60s), hypoxic(POX 80s) requiring an increase in FIO2 from 40% to 100% and an increase in peep from 5 to 10. HR increased to 130s-140s. Fentanyl bolus given and gtt increased to . Precedex gtt also increased. Stat CXR and ABG ordered. Ground team at bedside. See orders. Will cont to monitor.

## 2019-12-17 NOTE — Progress Notes (Signed)
Physical Therapy Treatment Patient Details Name: [name] MRN: [id number] DOB: 04/04/1956 Today's Date: 12/17/2019    History of Present Illness pt is a 64 y/o male with pmhx significant for CAD, DM, CABG, ACDF admitted 4/2 with COVID PNA and delirium, intubated 4/5, proned and paralyzed 4/5-4/9, sedated until 4/12, Bronchoscopy for tube change 4/16, 4/19 trached.  Remains sedated.    PT Comments    Pt was lethargic on arrival, woke up with loud stimuli and warm up ROM exercise.  Pt with little purposeful response.  Emphasis on transition to EOB, sitting balance with stress on upright sitting with head up.  Vitals remained stable on the vent through out   Follow Up Recommendations  LTACH;SNF     Equipment Recommendations  Other (comment)(TBA)    Recommendations for Other Services       Precautions / Restrictions Precautions Precautions: Fall Precaution Comments: trach, flexiseal, coretrack, large sacral decubitus    Mobility  Bed Mobility Overal bed mobility: Needs Assistance Bed Mobility: Rolling;Sidelying to Sit;Sit to Supine Rolling: Total assist;+2 for physical assistance Sidelying to sit: Total assist;+2 for physical assistance   Sit to supine: Total assist;+2 for physical assistance   General bed mobility comments: light sedation, pt with eyes open  Transfers                 General transfer comment: NT  Ambulation/Gait             General Gait Details: NT   Stairs             Wheelchair Mobility    Modified Rankin (Stroke Patients Only)       Balance Overall balance assessment: Needs assistance Sitting-balance support: Feet supported;Single extremity supported;Bilateral upper extremity supported Sitting balance-Leahy Scale: Zero Sitting balance - Comments: did not perceive any functional truncal reactions sitting EOB.  pt held head down unless assisted to full extention.                                       Cognition Arousal/Alertness: Lethargic Behavior During Therapy: Flat affect Overall Cognitive Status: Difficult to assess                                        Exercises Other Exercises Other Exercises: warm up PROM to bil LE's    General Comments General comments (skin integrity, edema, etc.): In supine at rest--  HR 75 bpm, SpO2 99% on vent, RR 30 rpm, BP 98/57,  In sitting after 2 min__  HR 108 bpm, SpO2 98% on vent, RR 43 rpm and bp 128/64.  Pt with eyes open >50%, but no purposeful responses.      Pertinent Vitals/Pain Pain Assessment: Faces Faces Pain Scale: No hurt    Home Living                      Prior Function            PT Goals (current goals can now be found in the care plan section) Acute Rehab PT Goals Patient Stated Goal: pt unable PT Goal Formulation: Patient unable to participate in goal setting Time For Goal Achievement: 12/30/19 Progress towards PT goals: Progressing toward goals(continue goals)    Frequency    Min 2X/week  PT Plan Current plan remains appropriate    Co-evaluation              AM-PAC PT "6 Clicks" Mobility   Outcome Measure  Help needed turning from your back to your side while in a flat bed without using bedrails?: Total Help needed moving from lying on your back to sitting on the side of a flat bed without using bedrails?: Total Help needed moving to and from a bed to a chair (including a wheelchair)?: Total Help needed standing up from a chair using your arms (e.g., wheelchair or bedside chair)?: Total Help needed to walk in hospital room?: Total Help needed climbing 3-5 steps with a railing? : Total 6 Click Score: 6    End of Session   Activity Tolerance: Patient tolerated treatment well;Patient limited by fatigue Patient left: in bed;with call bell/phone within reach;with nursing/sitter in room Nurse Communication: Mobility status PT Visit Diagnosis: Other abnormalities of gait  and mobility (R26.89);Muscle weakness (generalized) (M62.81);Adult, failure to thrive (R62.7)     Time: 3762-8315 PT Time Calculation (min) (ACUTE ONLY): 14 min  Charges:  $Therapeutic Activity: 8-22 mins                     12/17/2019  Evan Carne., PT Acute Rehabilitation Services [phone]  (pager) [phone]  (office)   [name] 12/17/2019, 4:51 PM

## 2019-12-17 NOTE — Progress Notes (Signed)
Patient transported to CT and back to 2M15 without complications.

## 2019-12-17 NOTE — Progress Notes (Signed)
eLink Physician-Brief Progress Note Patient Name: [name] DOB: 08/30/1955 MRN: [id number]   Date of Service  12/17/2019  HPI/Events of Note  Agitation   eICU Interventions  Will order: 1. Increase Fentanyl 50 mcg IV to Q 1 hour. 2. Increase ceiling on Precedex IV infusion to 1.4 mcg/kg/hour.      Intervention Category Major Interventions: Delirium, psychosis, severe agitation - evaluation and management  [name] 12/17/2019, 12:26 AM

## 2019-12-17 NOTE — Progress Notes (Signed)
[facility] PCCM Evening Rounds  Tolerated pressure support for 3 hours until PT worked with him and sat him up on side of bed  Maintain current level of care.  Heber Amesti, MD [facility] Pager: [phone] Cell: [phone] If no response, call [phone]

## 2019-12-17 NOTE — TOC Progression Note (Signed)
Transition of Care [facility]) - Progression Note    Patient Details  Name: [name] MRN: [id number] Date of Birth: [birth year]  [facility]) CM/SW Contact  Erbie Arment, Manfred Arch, RN Phone Number: 12/17/2019, 11:16 AM  Clinical Narrative:    [facility] will continue to follow.  Per agency ; Pt not currently appropriate to start auth.           Expected Discharge Plan and Services                                                 Social Determinants of Health (SDOH) Interventions    Readmission Risk Interventions No flowsheet data found.

## 2019-12-17 NOTE — Progress Notes (Signed)
NAME:  [name], MRN:  [id number], DOB:  1956/05/09, LOS: 39 ADMISSION DATE:  12/05/2019, CONSULTATION DATE:  4/5 REFERRING MD:  Jerral Cadel, CHIEF COMPLAINT:  Dyspnea   Brief History   64 year old man admitted 4/2 with COVID pneumonia and delirium after 1 week of symptoms.  Intubated on 4/5, required prolonged mechanical ventilatory support.  Tracheostomy performed on 4/20  Past Medical History    Allergic rhinitis   . Arthritis of left acromioclavicular joint 07/25/2014  . Asthma   . Chronic bronchitis (HCC)   . Chronic kidney disease    H/O STONES  . Colon polyp   . Coronary artery disease   . Diabetes mellitus without complication (HCC)   . Diverticula of colon   . GERD (gastroesophageal reflux disease)   . Hx of heart bypass surgery 2005  . Hypercholesteremia   . Impingement syndrome of left shoulder 07/25/2014  . Laceration of brachial artery 03/11/2016   left arm  . Lumbar disc disease   . Partial nontraumatic rupture of right rotator cuff 10/20/2016    Significant Hospital Events   4/19: tachypneic overnight and this am. Unable to wean sedation. Tracheostomy done  4/20: successful trach yesterday but de-recruited and on some escalated settings today. Remains tachypneic this am. Hypoglycemic yesterday and lantus changed 4/22: started on empiric abx but resp cx with abundant gpc. Improving oxygenation requirement but remains with rass -4 despite light sedation.  4/23:mucous plugging overnight and this am with copious/thick secretions.  4/28: issues overnight with increased vent settings. Still without purposeful responses. Fever overnight as well to 101.1. remains minimally responsive and unable to wean iv sedation except for short periods, despite increase in po agents.  4/29: somewhat improved settings overnight on 60/8. Still with temps overnight to 100.7 5/1: Continued improvement in vent requirement 50/8, remains on several sedating medications including  continuous fentanyl and precedex. Trach changed to 6.0 shiley XLT. 5/7 tolerating pressure support ventilation while on sedation  Consults:    Procedures:  04/05 Rt CVC > 4/24 04/16 Bronchoscopy  04/20Tracheostomy 4/24 Rt PICC  Significant Diagnostic Tests:  4/22 CXR: 1. Lines and tubes stable position. 2. Persistent bibasilar pulmonary infiltrates/edema with interim slight clearing from prior exam. 3. Prior CABG. Heart size stable.  4/24 CXR: Slight increase in interstitial and airspace opacities particularly in the lower lobes. Findings suspicious for worsening asymmetric edema or infection.  Head CT 4/27 Mild chronic ischemic white matter disease. No acute intracranial abnormality seen.  Abdominal CT 4/27 > for PEG, trace pneumoperitoneum, extensive airspace disease due to COVID 19, kidney stone left, non-obstructing  MRI brain 5/3 > NAICP, chronic small vessel ischemia, chronic microhemorrhages, stable mild generalized atrophy, ethmoid sinus thickening, bilateral mastoid effusions  Chest CT 5/11 > 1. Pulmonary fibrosis with UIP pattern, new from 2015. 2. There is lower lobe atelectasis. No edema or convincing pneumonia.  Micro Data:  COVID 4/2 >Positive  Blood cultures 4/2 > negative  Respiratory culture 4/5 > Negative  Respiratory culture 4/12 > Normal flora  Respiratory culture 4/21 > Normal flora   Antimicrobials:  4/21 Vancomycin > 4/23 4/21 Zosyn > 4/26  Interim history/subjective:  Continues issues with tachypnea and hypoxia with any attempts to wean, remains on IV sedation   Objective   Blood pressure 103/66, pulse (!) 59, temperature 98.5 F (36.9 C), temperature source Oral, resp. rate 20, height 5\' 8"  (1.727 m), weight 80.2 kg, SpO2 100 %.    Vent Mode: PRVC FiO2 (%):  [  40 %-100 %] 40 % Set Rate:  [20 bmp] 20 bmp Vt Set:  [480 mL] 480 mL PEEP:  [5 cmH20-10 cmH20] 5 cmH20 Plateau Pressure:  [20 cmH20-28 cmH20] 20 cmH20   Intake/Output  Summary (Last 24 hours) at 12/17/2019 1003 Last data filed at 12/17/2019 0900 Gross per 24 hour  Intake 2146.83 ml  Output 1650 ml  Net 496.83 ml   Filed Weights   12/15/19 0128 12/16/19 0300 12/17/19 0421  Weight: 81.9 kg 78.1 kg 80.2 kg    Examination: General: Chronically ill appearing elderly very deconditioned male lying in bed with intermittent respiratory distress with wean attempts  HEENT: #6 shiley cuffed trach midline, MM pink/moist, PERRL NG tube in place  Neuro: Will track in room with grimace to pain but unable to follow any commands CV: s1s2 regular rate and rhythm, no murmur, rubs, or gallops,  PULM:  Diminished bilaterally but no added breath sounds, periods of tachypnea  GI: soft, bowel sounds active in all 4 quadrants, non-tender, non-distended, tolerating TF Extremities: warm/dry, no edema  Skin: no rashes or lesions   Resolved Hospital Problem list   COVID-19 pneumonia -Has completed full course of remdesivir and dexamethasone, off isolation 4/23.  HCAP Pneumomediastinum/pneumopericardium  Assessment & Plan:   Acute respiratory failure with hypoxemia due to COVID 19  Severe respiratory muscle weakness -Requiring prolonged mechanical wean  Post COVID pulmonary fibrosis  -Pulmonary fibrosis with UIP pattern on chest CT 5/11 P: Continue to express respiratory distress with significant tachypnea with any wean trials Continue to maximize po and topical medications to liberate from IV sedation  Continue vent support with lung protective strategies  Wean PEEP and FiO2 as able  VAP bundle in place  PAD protocol   Acute toxic metabolic encephalopathy -MRI brain with some chronic changes but no large cerebral injury, EEG without epileptiform activity Suspect oxycodone intolerance, transitioning to fentanyl patch Severe ICU myopathy On multiple neurologic and centrally acting drugs P: Continue to maximize po and topical medications to liberate from IV sedation   Mentation continue to wax and wane  Continue Clonazepam, Trazodone, Gabapentin, Seroquel, and Klonopin   Positive Cumulative Fluid Balance  -Continue to remains ~10L positive, patient doesn't appear grossly volume overloaded  P: Hold on any further diuresing today Monitor volume status   History of asthma P: Albuterol as needed   DM2 with hyperglycemia -CBG seem well controled on current regiment  P: Continue current regiment with SSI, Lantus, and TF coverage   Urinary retention -External catheter in place  P: Continue Doxazosin External catheter in place   Goals of care:  Possible need for LTAC placement   Best practice:  Diet: continue tube feeding Pain/Anxiety/Delirium protocol (if indicated): as above VAP protocol (if indicated): yes DVT prophylaxis: lovenox 40mg  daily GI prophylaxis: PPI Glucose control: Lantus+ SSI Mobility: needs mobility attempts Code Status: full Family Communication: Will update wife via phone today  Disposition: remain in ICU  Labs   CBC: Recent Labs  Lab 12/12/19 0425 12/12/19 0425 12/13/19 0447 12/14/19 0436 12/15/19 0421 12/17/19 0003 12/17/19 0415  WBC 7.8  --  9.3 10.8* 11.7*  --  14.7*  NEUTROABS 4.0  --  4.5 5.6  --   --   --   HGB 8.5*   < > 8.3* 9.2* 8.6* 10.2* 9.1*  HCT 27.3*   < > 26.9* 29.4* 28.4* 30.0* 29.7*  MCV 94.5  --  93.4 92.2 93.1  --  93.7  PLT 306  --  290 315 337  --  395   < > = values in this interval not displayed.    Basic Metabolic Panel: Recent Labs  Lab 12/14/19 0436 12/14/19 0436 12/14/19 1720 12/15/19 0421 12/16/19 1030 12/17/19 0003 12/17/19 0415  NA 136   < > 135 139 139 136 139  K 3.8   < > 4.6 3.6 4.3 3.3* 3.6  CL 91*  --  91* 89* 90*  --  91*  CO2 32  --  33* 39* 38*  --  37*  GLUCOSE 156*  --  226* 221* 158*  --  209*  BUN 16  --  18 22 25*  --  34*  CREATININE 0.41*  --  0.48* 0.49* 0.50*  --  0.51*  CALCIUM 8.3*  --  8.6* 8.8* 8.7*  --  8.9  MG  --   --   --  2.1  --    --   --    < > = values in this interval not displayed.   GFR: Estimated Creatinine Clearance: 89.1 mL/min (A) (by C-G formula based on SCr of 0.51 mg/dL (L)). Recent Labs  Lab 12/13/19 0447 12/14/19 0436 12/15/19 0421 12/17/19 0415  WBC 9.3 10.8* 11.7* 14.7*    Liver Function Tests: Recent Labs  Lab 12/13/19 0447 12/14/19 0436  AST 18 20  ALT 14 13  ALKPHOS 51 57  BILITOT 0.5 0.6  PROT 5.6* 6.3*  ALBUMIN 1.5* 1.7*   No results for input(s): LIPASE, AMYLASE in the last 168 hours. No results for input(s): AMMONIA in the last 168 hours.  ABG    Component Value Date/Time   PHART 7.475 (H) 12/17/2019 0003   PCO2ART 58.0 (H) 12/17/2019 0003   PO2ART 84 12/17/2019 0003   HCO3 42.6 (H) 12/17/2019 0003   TCO2 44 (H) 12/17/2019 0003   ACIDBASEDEF 1.0 11/11/2019 1529   O2SAT 96.0 12/17/2019 0003     Coagulation Profile: No results for input(s): INR, PROTIME in the last 168 hours.  Cardiac Enzymes: No results for input(s): CKTOTAL, CKMB, CKMBINDEX, TROPONINI in the last 168 hours.  HbA1C: Hgb A1c MFr Bld  Date/Time Value Ref Range Status  11/28/2019 06:51 PM 7.5 (H) 4.8 - 5.6 % Final    Comment:    (NOTE) Pre diabetes:          5.7%-6.4% Diabetes:              >6.4% Glycemic control for   <7.0% adults with diabetes   12/19/2018 02:21 PM 7.7 (H) 4.6 - 6.5 % Final    Comment:    Glycemic Control Guidelines for People with Diabetes:Non Diabetic:  <6%Goal of Therapy: <7%Additional Action Suggested:  >8%     CBG: Recent Labs  Lab 12/16/19 1658 12/16/19 2000 12/16/19 2341 12/17/19 0341 12/17/19 0743  GLUCAP 149* 166* 221* 196* 216*    CRITICAL CARE Performed by: Johnsie Cancel  Total critical care time: 37 minutes  Critical care time was exclusive of separately billable procedures and treating other patients.  Critical care was necessary to treat or prevent imminent or life-threatening deterioration.  Critical care was time spent personally by me on  the following activities: development of treatment plan with patient and/or surrogate as well as nursing, discussions with consultants, evaluation of patient's response to treatment, examination of patient, obtaining history from patient or surrogate, ordering and performing treatments and interventions, ordering and review of laboratory studies, ordering and review of radiographic studies, pulse oximetry and  re-evaluation of patient's condition.  Delfin Gant, NP-C [facility] Contact / Pager information can be found on Amion  12/17/2019, 10:03 AM

## 2019-12-17 NOTE — Progress Notes (Signed)
CPT held at this time. PT is with patient.

## 2019-12-18 ENCOUNTER — Inpatient Hospital Stay (HOSPITAL_COMMUNITY): Payer: Medicare Other

## 2019-12-18 LAB — POCT I-STAT 7, (LYTES, BLD GAS, ICA,H+H)
Acid-Base Excess: 11 mmol/L — ABNORMAL HIGH (ref 0.0–2.0)
Bicarbonate: 36.4 mmol/L — ABNORMAL HIGH (ref 20.0–28.0)
Calcium, Ion: 1.22 mmol/L (ref 1.15–1.40)
HCT: 29 % — ABNORMAL LOW (ref 39.0–52.0)
Hemoglobin: 9.9 g/dL — ABNORMAL LOW (ref 13.0–17.0)
O2 Saturation: 94 %
Patient temperature: 99
Potassium: 4.2 mmol/L (ref 3.5–5.1)
Sodium: 140 mmol/L (ref 135–145)
TCO2: 38 mmol/L — ABNORMAL HIGH (ref 22–32)
pCO2 arterial: 52.5 mmHg — ABNORMAL HIGH (ref 32.0–48.0)
pH, Arterial: 7.449 (ref 7.350–7.450)
pO2, Arterial: 68 mmHg — ABNORMAL LOW (ref 83.0–108.0)

## 2019-12-18 LAB — BASIC METABOLIC PANEL
Anion gap: 10 (ref 5–15)
BUN: 38 mg/dL — ABNORMAL HIGH (ref 8–23)
CO2: 34 mmol/L — ABNORMAL HIGH (ref 22–32)
Calcium: 8.6 mg/dL — ABNORMAL LOW (ref 8.9–10.3)
Chloride: 96 mmol/L — ABNORMAL LOW (ref 98–111)
Creatinine, Ser: 0.54 mg/dL — ABNORMAL LOW (ref 0.61–1.24)
GFR calc Af Amer: >60 mL/min (ref >60)
GFR calc non Af Amer: >60 mL/min (ref >60)
Glucose, Bld: 154 mg/dL — ABNORMAL HIGH (ref 70–99)
Potassium: 2.9 mmol/L — ABNORMAL LOW (ref 3.5–5.1)
Sodium: 140 mmol/L (ref 135–145)

## 2019-12-18 LAB — GLUCOSE, CAPILLARY
Glucose-Capillary: 143 mg/dL — ABNORMAL HIGH (ref 70–99)
Glucose-Capillary: 144 mg/dL — ABNORMAL HIGH (ref 70–99)
Glucose-Capillary: 153 mg/dL — ABNORMAL HIGH (ref 70–99)
Glucose-Capillary: 159 mg/dL — ABNORMAL HIGH (ref 70–99)
Glucose-Capillary: 166 mg/dL — ABNORMAL HIGH (ref 70–99)
Glucose-Capillary: 214 mg/dL — ABNORMAL HIGH (ref 70–99)

## 2019-12-18 LAB — CBC WITH DIFFERENTIAL/PLATELET
Abs Immature Granulocytes: 0.28 10*3/uL — ABNORMAL HIGH (ref 0.00–0.07)
Basophils Absolute: 0.1 10*3/uL (ref 0.0–0.1)
Basophils Relative: 1 %
Eosinophils Absolute: 3.2 10*3/uL — ABNORMAL HIGH (ref 0.0–0.5)
Eosinophils Relative: 27 %
HCT: 28.3 % — ABNORMAL LOW (ref 39.0–52.0)
Hemoglobin: 8.7 g/dL — ABNORMAL LOW (ref 13.0–17.0)
Immature Granulocytes: 2 %
Lymphocytes Relative: 17 %
Lymphs Abs: 2 10*3/uL (ref 0.7–4.0)
MCH: 28.6 pg (ref 26.0–34.0)
MCHC: 30.7 g/dL (ref 30.0–36.0)
MCV: 93.1 fL (ref 80.0–100.0)
Monocytes Absolute: 0.9 10*3/uL (ref 0.1–1.0)
Monocytes Relative: 8 %
Neutro Abs: 5.4 10*3/uL (ref 1.7–7.7)
Neutrophils Relative %: 45 %
Platelets: 344 10*3/uL (ref 150–400)
RBC: 3.04 MIL/uL — ABNORMAL LOW (ref 4.22–5.81)
RDW: 15.2 % (ref 11.5–15.5)
WBC: 11.9 10*3/uL — ABNORMAL HIGH (ref 4.0–10.5)
nRBC: 0 % (ref 0.0–0.2)

## 2019-12-18 LAB — POTASSIUM: Potassium: 3.9 mmol/L (ref 3.5–5.1)

## 2019-12-18 MED ORDER — VANCOMYCIN HCL IN DEXTROSE 1-5 GM/200ML-% IV SOLN: 1000.0000 mg | INTRAVENOUS | Status: AC

## 2019-12-18 MED ORDER — CLONIDINE HCL 0.3 MG PO TABS
0.3000 mg | ORAL_TABLET | Freq: Four times a day (QID) | ORAL | Status: DC
  Administered 2019-12-18 – 2019-12-22 (×11): 0.3 mg via ORAL
  Filled 2019-12-18 (×13): qty 1

## 2019-12-18 MED ORDER — MIDAZOLAM HCL 2 MG/2ML IJ SOLN
2.0000 mg | Freq: Once | INTRAMUSCULAR | Status: AC
  Administered 2019-12-18: 2 mg via INTRAVENOUS
  Filled 2019-12-18: qty 2

## 2019-12-18 MED ORDER — POTASSIUM CHLORIDE 20 MEQ/15ML (10%) PO SOLN
40.0000 meq | Freq: Once | ORAL | Status: AC
  Administered 2019-12-18: 40 meq
  Filled 2019-12-18: qty 30

## 2019-12-18 MED ORDER — POTASSIUM CHLORIDE 10 MEQ/50ML IV SOLN
10.0000 meq | INTRAVENOUS | Status: AC
  Administered 2019-12-18 (×4): 10 meq via INTRAVENOUS
  Filled 2019-12-18 (×4): qty 50

## 2019-12-18 MED ORDER — VITAL AF 1.2 CAL PO LIQD
1000.0000 mL | ORAL | Status: DC
  Administered 2019-12-18 – 2020-01-03 (×20): 1000 mL
  Filled 2019-12-18 (×6): qty 1000

## 2019-12-18 MED ORDER — MIDAZOLAM HCL 2 MG/2ML IJ SOLN
2.0000 mg | INTRAMUSCULAR | Status: DC | PRN
  Administered 2019-12-18 – 2019-12-21 (×6): 2 mg via INTRAVENOUS
  Filled 2019-12-18 (×7): qty 2

## 2019-12-18 MED ORDER — JUVEN PO PACK
1.0000 | PACK | Freq: Two times a day (BID) | ORAL | Status: DC
  Administered 2019-12-18 – 2020-01-07 (×39): 1
  Filled 2019-12-18 (×39): qty 1

## 2019-12-18 MED ORDER — ENOXAPARIN SODIUM 40 MG/0.4ML ~~LOC~~ SOLN: 40.0000 mg | SUBCUTANEOUS | Status: DC

## 2019-12-18 MED ORDER — FUROSEMIDE 10 MG/ML IJ SOLN
40.0000 mg | Freq: Four times a day (QID) | INTRAMUSCULAR | Status: AC
  Administered 2019-12-18 (×2): 40 mg via INTRAVENOUS
  Filled 2019-12-18 (×2): qty 4

## 2019-12-18 NOTE — Progress Notes (Signed)
RT note-Fio2 increased to 100% post ABG, Dr. Kendrick Fries aware.

## 2019-12-18 NOTE — Progress Notes (Addendum)
Physical Therapy Wound Treatment Patient Details  Name: [name] MRN: [id number] Date of Birth: 29-Jun-1956  Today's Date: 12/18/2019 Time: 2111-5520 Time Calculation (min): 43 min  Subjective  Patient and Family Stated Goals: intubated and sedated Date of Onset: (unknown) Prior Treatments: moist gauze and santyl   Pain Score:  Pt given pain medication prior to treatment, no evidence of increase in pain   Wound Assessment  Pressure Injury 12/12/19 Coccyx Mid Unstageable - Full thickness tissue loss in which the base of the injury is covered by slough (yellow, tan, gray, green or brown) and/or eschar (tan, brown or black) in the wound bed. (Active)  Wound Image   12/17/19 1400  Dressing Type ABD;Moist to moist;Other (Comment);Barrier Film (skin prep) 12/18/19 1700  Dressing Changed 12/18/19 1700  Dressing Change Frequency Daily 12/18/19 1700  State of Healing Eschar 12/18/19 1700  Site / Wound Assessment Bleeding;Black;Purple;Red;Yellow 12/18/19 1700  % Wound base Red or Granulating 15% 12/18/19 1700  % Wound base Yellow/Fibrinous Exudate 85% 12/18/19 1700  % Wound base Black/Eschar 90% 12/16/19 1000  Peri-wound Assessment Erythema (blanchable) 12/18/19 1700  Wound Length (cm) 8.5 cm 12/17/19 1400  Wound Width (cm) 7.5 cm 12/17/19 1400  Wound Surface Area (cm^2) 63.75 cm^2 12/17/19 1400  Margins Unattached edges (unapproximated) 12/18/19 1700  Drainage Amount Moderate 12/18/19 1700  Drainage Description Serosanguineous 12/18/19 1700  Treatment Debridement (Selective);Hydrotherapy (Pulse lavage);Packing (Saline gauze) 12/18/19 1700  Santyl applied to necrotic tissue Hydrotherapy Pulsed lavage therapy - wound location: sacral wound Pulsed Lavage with Suction (psi): 12 psi Pulsed Lavage with Suction - Normal Saline Used: 1000 mL Pulsed Lavage Tip: Tip with splash shield Selective Debridement Selective Debridement - Location: sacral wound Selective Debridement - Tools Used:  Forceps;Scalpel Selective Debridement - Tissue Removed: eschar softened by santyl and pulse lavage   Wound Assessment and Plan  Wound Therapy - Assess/Plan/Recommendations Wound Therapy - Clinical Statement: Pt with softening of eschar with pulse lavage and santyl, allowing for improved debridement today. Pt will continue to benefit from pulsed lavage and debridement to decrease bioburden and improve wound healing  Wound Therapy - Functional Problem List: sedated, and immobile Factors Delaying/Impairing Wound Healing: Immobility;Incontinence;Multiple medical problems Hydrotherapy Plan: Debridement;Pulsatile lavage with suction;Patient/family education Wound Therapy - Frequency: 6X / week Wound Therapy - Follow Up Recommendations: Skilled nursing facility Wound Plan: see above  Wound Therapy Goals- Improve the function of patient's integumentary system by progressing the wound(s) through the phases of wound healing (inflammation - proliferation - remodeling) by: Decrease Necrotic Tissue to: 5 Decrease Necrotic Tissue - Progress: Progressing toward goal Increase Granulation Tissue to: 95 Increase Granulation Tissue - Progress: Progressing toward goal Decrease Length/Width/Depth by (cm): 10% Goals/treatment plan/discharge plan were made with and agreed upon by patient/family: No, Patient unable to participate in goals/treatment/discharge plan and family unavailable Time For Goal Achievement: 7 days Wound Therapy - Potential for Goals: Good  Goals will be updated until maximal potential achieved or discharge criteria met.  Discharge criteria: when goals achieved, discharge from hospital, MD decision/surgical intervention, no progress towards goals, refusal/missing three consecutive treatments without notification or medical reason.  GP    Dani Gobble. Migdalia Dk PT, DPT Acute Rehabilitation Services Pager [phone] Office [phone]  [place] 12/18/2019, 5:12  PM

## 2019-12-18 NOTE — Progress Notes (Signed)
eLink Physician-Brief Progress Note Patient Name: [name] DOB: September 08, 1955 MRN: [id number]   Date of Service  12/18/2019  HPI/Events of Note  Ventilaror asynchrony - On ceiling doses of Precedex and Fentanyl IV infusions.   eICU Interventions  Will order: 1. Versed 2 mg IV Q 2 hours PRN.      Intervention Category Major Interventions: Other:  Lenell Antu 12/18/2019, 7:59 PM

## 2019-12-18 NOTE — Progress Notes (Signed)
Patient ID: [name], male   DOB: 12/15/1955, 64 y.o.   MRN: [id number]    a few G tubes have arrived IR Pt is now scheduled for 5/13  afeb now Wbc 11 See orders for npo and hold lovenox  We will call for pt in am

## 2019-12-18 NOTE — Progress Notes (Signed)
Nutrition Follow-up RD working remotely.  DOCUMENTATION CODES:   Not applicable  INTERVENTION:   Continue Tube Feeding via Cortrak:  Vital AF 1.2 increase goal rate to 75 ml/hr Pro-Stat 30 mL daily Free water flushes 100 ml every 4 hours  Provides 2260 kcals, 150 g of protein and 2060 mL of free water total.  Add Juven BID via Cortrak, each packet provides 80 calories, 8 grams of carbohydrate, 2.5 grams of protein (collagen), 7 grams of L-arginine and 7 grams of L-glutamine; supplement contains CaHMB, Vitamins C, E, B12 and Zinc to promote wound healing.   NUTRITION DIAGNOSIS:   Inadequate oral intake related to inability to eat, acute illness as evidenced by NPO status.  Ongoing  GOAL:   Patient will meet greater than or equal to 90% of their needs  Met with TF  MONITOR:   Vent status, Labs, Weight trends, TF tolerance  REASON FOR ASSESSMENT:   Ventilator, Consult Enteral/tube feeding initiation and management  ASSESSMENT:   64 yo male with ARDS secondary to COVID-19 pnuemonia requiring intubation, acute encephalopathy with hx of heavy EtOH abuse, AKI. PMH includes DM, HTN, CAD s/p PCI/CABG  4/02 Admitted 4/05 Intubated, Paralyzed, Prone 4/06 TF initiated via OG tube 4/07 Cortrak, tip in the stomach 4/09 Proning and paralyzation stopped 4/19 Trach placed                                            Continues to receive TF via Cortrak: Vital AF 1.2 at 65 ml/h with Pro-stat 30 ml once daily.  Free water 100 ml every 4 hours.   Patient remains on ventilator support, on fentanyl and precedex drips. MV: 9.7 L/min Temp (24hrs), Avg:100 F (37.8 C), Min:98.9 F (37.2 C), Max:100.7 F (38.2 C)   Labs reviewed. K 2.9 (L) CBGs [id number] Medications reviewed and include ss novolog, lantus, tradjenta, MVI with minerals, KCl, thiamine.  I/O +2.4 L since admission. UOP 1650 ml x 24 hours Weight down by 3.5 kg over the past week. Admission weight 90.3 kg  DTI  to coccyx has evolved into an unstageable pressure injury to bilateral buttocks and sacral area. Received hydrotherapy with PT 5/11.   Diet Order:   Diet Order            Diet NPO time specified  Diet effective now              EDUCATION NEEDS:   Not appropriate for education at this time  Skin:  Skin Assessment: Skin Integrity Issues: Skin Integrity Issues:: Unstageable DTI: N/A Unstageable: coccyx & bilateral buttocks  Last BM:  5/11 type 6  Height:   Ht Readings from Last 1 Encounters:  11/29/19 _0  (1.727 m)    Weight:   Wt Readings from Last 1 Encounters:  12/18/19 79.8 kg    BMI:  Body mass index is 26.75 kg/m.  Estimated Nutritional Needs:   Kcal:  2200-2400  Protein:  135-160 gm  Fluid:  >/= 2.2 L   Lucas Mallow, RD, LDN, CNSC Please refer to Amion for contact information.

## 2019-12-18 NOTE — Progress Notes (Signed)
K was 2.9 and replaced per protocol  [facility]

## 2019-12-18 NOTE — Progress Notes (Signed)
RT note-Called to patients room for sp02 40%, patient being bagged with fio2 100% ambu, BBS slightly diminished on left, Dr. Kendrick Fries at bedside, bronchoscopy performed, sp02 probe placed on forehead, reading 100% now, ABG done at this time. Placed back to ventilator, continue to monitor.

## 2019-12-18 NOTE — Progress Notes (Signed)
Pt's wife called and was updated. All questions answered appropriately.

## 2019-12-18 NOTE — Progress Notes (Signed)
NAME:  [name], MRN:  [id number], DOB:  [birth year], LOS: 40 ADMISSION DATE:  11/07/2019, CONSULTATION DATE:  4/5 REFERRING MD:  Jerral Donzell, CHIEF COMPLAINT:  Dyspnea   Brief History   64 year old man admitted 4/2 with COVID pneumonia and delirium after 1 week of symptoms.  Intubated on 4/5, required prolonged mechanical ventilatory support.  Tracheostomy performed on 4/20  Past Medical History    Allergic rhinitis   . Arthritis of left acromioclavicular joint 07/25/2014  . Asthma   . Chronic bronchitis (HCC)   . Chronic kidney disease    H/O STONES  . Colon polyp   . Coronary artery disease   . Diabetes mellitus without complication (HCC)   . Diverticula of colon   . GERD (gastroesophageal reflux disease)   . Hx of heart bypass surgery 2005  . Hypercholesteremia   . Impingement syndrome of left shoulder 07/25/2014  . Laceration of brachial artery 03/11/2016   left arm  . Lumbar disc disease   . Partial nontraumatic rupture of right rotator cuff 10/20/2016    Significant Hospital Events   4/19: tachypneic overnight and this am. Unable to wean sedation. Tracheostomy done  4/20: successful trach yesterday but de-recruited and on some escalated settings today. Remains tachypneic this am. Hypoglycemic yesterday and lantus changed 4/22: started on empiric abx but resp cx with abundant gpc. Improving oxygenation requirement but remains with rass -4 despite light sedation.  4/23:mucous plugging overnight and this am with copious/thick secretions.  4/28: issues overnight with increased vent settings. Still without purposeful responses. Fever overnight as well to 101.1. remains minimally responsive and unable to wean iv sedation except for short periods, despite increase in po agents.  4/29: somewhat improved settings overnight on 60/8. Still with temps overnight to 100.7 5/1: Continued improvement in vent requirement 50/8, remains on several sedating medications including  continuous fentanyl and precedex. Trach changed to 6.0 shiley XLT. 5/7 tolerating pressure support ventilation while on sedation  Consults:    Procedures:  04/05 Rt CVC > 4/24 04/16 Bronchoscopy  04/20Tracheostomy 4/24 Rt PICC  Significant Diagnostic Tests:  4/22 CXR: 1. Lines and tubes stable position. 2. Persistent bibasilar pulmonary infiltrates/edema with interim slight clearing from prior exam. 3. Prior CABG. Heart size stable.  4/24 CXR: Slight increase in interstitial and airspace opacities particularly in the lower lobes. Findings suspicious for worsening asymmetric edema or infection.  Head CT 4/27 Mild chronic ischemic white matter disease. No acute intracranial abnormality seen.  Abdominal CT 4/27 > for PEG, trace pneumoperitoneum, extensive airspace disease due to COVID 19, kidney stone left, non-obstructing  MRI brain 5/3 > NAICP, chronic small vessel ischemia, chronic microhemorrhages, stable mild generalized atrophy, ethmoid sinus thickening, bilateral mastoid effusions  Chest CT 5/11 > 1. Pulmonary fibrosis with UIP pattern, new from 2015. 2. There is lower lobe atelectasis. No edema or convincing pneumonia.  Micro Data:  COVID 4/2 >Positive  Blood cultures 4/2 > negative  Respiratory culture 4/5 > Negative  Respiratory culture 4/12 > Normal flora  Respiratory culture 4/21 > Normal flora   Antimicrobials:  4/21 Vancomycin > 4/23 4/21 Zosyn > 4/26  Interim history/subjective:  Able to wean 3hrs yesterday, no acute events overnight.    Objective   Blood pressure (!) 98/56, pulse 66, temperature 99.2 F (37.3 C), temperature source Axillary, resp. rate 20, height 5\' 8"  (1.727 m), weight 79.8 kg, SpO2 99 %.    Vent Mode: PRVC FiO2 (%):  [40 %] 40 % Set  Rate:  [20 bmp] 20 bmp Vt Set:  [480 mL] 480 mL PEEP:  [5 cmH20] 5 cmH20 Pressure Support:  [15 cmH20] 15 cmH20 Plateau Pressure:  [20 cmH20-37 cmH20] 25 cmH20   Intake/Output Summary  (Last 24 hours) at 12/18/2019 0859 Last data filed at 12/18/2019 0700 Gross per 24 hour  Intake 2802.21 ml  Output 1650 ml  Net 1152.21 ml   Filed Weights   12/16/19 0300 12/17/19 0421 12/18/19 0500  Weight: 78.1 kg 80.2 kg 79.8 kg    Examination: General: Chronically ill appearing very deconditioned elderly male on mechanical ventilator through trach  in NAD HEENT: Kemp/AT #6 shiley cuffed trach midline, MM pink/moist, PERRL, NGT in place,  Neuro: Eyes open and tracking in room, able to follow simple commands but generalized weakness CV: s1s2 regular rate and rhythm, no murmur, rubs, or gallops,  PULM:  Clear to ascultation bilaterally, no added breath sounds GI: soft, bowel sounds active in all 4 quadrants, non-tender, non-distended, tolerating TF Extremities: warm/dry, no edema  Skin: no rashes or lesions  Resolved Hospital Problem list   COVID-19 pneumonia -Has completed full course of remdesivir and dexamethasone, off isolation 4/23.  HCAP Pneumomediastinum/pneumopericardium  Assessment & Plan:   Acute respiratory failure with hypoxemia due to COVID 19  Severe respiratory muscle weakness -Requiring prolonged mechanical wean  Post COVID pulmonary fibrosis  -Pulmonary fibrosis with UIP pattern on chest CT 5/11 P: Continue attempts to wean vent, expect this to be a very slow process Maximize PO and topical sedation/antianxiety meds to liberate from IV Wean PEEP and FiO2 as able  VAP bundle in place  Continue PT/OT  Acute toxic metabolic encephalopathy -MRI brain with some chronic changes but no large cerebral injury, EEG without epileptiform activity Suspect oxycodone intolerance, transitioning to fentanyl patch Severe ICU myopathy On multiple neurologic and centrally acting drugs P: Continue Clonazepam, Trazadone, Gabapentin, Seroquel, and Klonipin Mentation appears to be slowly improving   Positive Cumulative Fluid Balance  -Continue to remains ~10L positive,  patient doesn't appear grossly volume overloaded  P: Diurese again today  Trend Bmet  Supplement potassium  History of asthma P: Albuterol as needed   DM2 with hyperglycemia -CBG seem well controled on current regiment  P: Continue current regiment with SSI, Lantus, and TF coverage   Urinary retention -External catheter in place  P: Continue Doxazosin External catheter in place   Goals of care:  Possible need for LTAC placement   Best practice:  Diet: continue tube feeding Pain/Anxiety/Delirium protocol (if indicated): as above VAP protocol (if indicated): yes DVT prophylaxis: lovenox 40mg  daily GI prophylaxis: PPI Glucose control: Lantus+ SSI Mobility: needs mobility attempts Code Status: full Family Communication: Will update wife via phone today  Disposition: remain in ICU  Labs   CBC: Recent Labs  Lab 12/12/19 0425 12/12/19 0425 12/13/19 0447 12/13/19 0447 12/14/19 0436 12/15/19 0421 12/17/19 0003 12/17/19 0415 12/18/19 0459  WBC 7.8   < > 9.3  --  10.8* 11.7*  --  14.7* 11.9*  NEUTROABS 4.0  --  4.5  --  5.6  --   --   --  5.4  HGB 8.5*   < > 8.3*   < > 9.2* 8.6* 10.2* 9.1* 8.7*  HCT 27.3*   < > 26.9*   < > 29.4* 28.4* 30.0* 29.7* 28.3*  MCV 94.5   < > 93.4  --  92.2 93.1  --  93.7 93.1  PLT 306   < > 290  --  315 337  --  395 344   < > = values in this interval not displayed.    Basic Metabolic Panel: Recent Labs  Lab 12/14/19 1720 12/14/19 1720 12/15/19 0421 12/16/19 1030 12/17/19 0003 12/17/19 0415 12/18/19 0459  NA 135   < > 139 139 136 139 140  K 4.6   < > 3.6 4.3 3.3* 3.6 2.9*  CL 91*  --  89* 90*  --  91* 96*  CO2 33*  --  39* 38*  --  37* 34*  GLUCOSE 226*  --  221* 158*  --  209* 154*  BUN 18  --  22 25*  --  34* 38*  CREATININE 0.48*  --  0.49* 0.50*  --  0.51* 0.54*  CALCIUM 8.6*  --  8.8* 8.7*  --  8.9 8.6*  MG  --   --  2.1  --   --   --   --    < > = values in this interval not displayed.   GFR: Estimated Creatinine  Clearance: 89.1 mL/min (A) (by C-G formula based on SCr of 0.54 mg/dL (L)). Recent Labs  Lab 12/14/19 0436 12/15/19 0421 12/17/19 0415 12/18/19 0459  WBC 10.8* 11.7* 14.7* 11.9*    Liver Function Tests: Recent Labs  Lab 12/13/19 0447 12/14/19 0436  AST 18 20  ALT 14 13  ALKPHOS 51 57  BILITOT 0.5 0.6  PROT 5.6* 6.3*  ALBUMIN 1.5* 1.7*   No results for input(s): LIPASE, AMYLASE in the last 168 hours. No results for input(s): AMMONIA in the last 168 hours.  ABG    Component Value Date/Time   PHART 7.475 (H) 12/17/2019 0003   PCO2ART 58.0 (H) 12/17/2019 0003   PO2ART 84 12/17/2019 0003   HCO3 42.6 (H) 12/17/2019 0003   TCO2 44 (H) 12/17/2019 0003   ACIDBASEDEF 1.0 11/11/2019 1529   O2SAT 96.0 12/17/2019 0003     Coagulation Profile: No results for input(s): INR, PROTIME in the last 168 hours.  Cardiac Enzymes: No results for input(s): CKTOTAL, CKMB, CKMBINDEX, TROPONINI in the last 168 hours.  HbA1C: Hgb A1c MFr Bld  Date/Time Value Ref Range Status  November 12, 2019 06:51 PM 7.5 (H) 4.8 - 5.6 % Final    Comment:    (NOTE) Pre diabetes:          5.7%-6.4% Diabetes:              >6.4% Glycemic control for   <7.0% adults with diabetes   12/19/2018 02:21 PM 7.7 (H) 4.6 - 6.5 % Final    Comment:    Glycemic Control Guidelines for People with Diabetes:Non Diabetic:  <6%Goal of Therapy: <7%Additional Action Suggested:  >8%     CBG: Recent Labs  Lab 12/17/19 1541 12/17/19 2040 12/18/19 0028 12/18/19 0430 12/18/19 0740  GLUCAP 113* 152* 159* 153* 144*    CRITICAL CARE Performed by: Johnsie Cancel  Total critical care time: 38 minutes  Critical care time was exclusive of separately billable procedures and treating other patients.  Critical care was necessary to treat or prevent imminent or life-threatening deterioration.  Critical care was time spent personally by me on the following activities: development of treatment plan with patient and/or surrogate  as well as nursing, discussions with consultants, evaluation of patient's response to treatment, examination of patient, obtaining history from patient or surrogate, ordering and performing treatments and interventions, ordering and review of laboratory studies, ordering and review of radiographic studies, pulse oximetry and  re-evaluation of patient's condition.  Delfin Gant, NP-C [facility] Contact / Pager information can be found on Amion  12/18/2019, 8:59 AM

## 2019-12-18 NOTE — Progress Notes (Addendum)
O2 sat on finger was reading in mid 30s with good pleth, 100% FIO2 on vent was given with no results. RT and CCM called and patient was bagged. Bedside ultrasound and CXR were taken that looked okay. ABG results showed O2 was 69 while being bagged. Nelcore probe on forehead was used during this showing mid 90s. Patient is resting comfortably and color never changed. Will continue to monitor closely. Tamsen Meek, RN 12/18/2019 12:41 PM

## 2019-12-18 NOTE — Progress Notes (Signed)
eLink Physician-Brief Progress Note Patient Name: [name] DOB: [birth year] MRN: [id number]   Date of Service  12/18/2019  HPI/Events of Note  No AM labs.  eICU Interventions  Will order: 1. CBC with platelets and BMP in AM.      Intervention Category Major Interventions: Other:  Gus Littler Dennard Nip 12/18/2019, 3:02 AM

## 2019-12-19 LAB — GLUCOSE, CAPILLARY
Glucose-Capillary: 104 mg/dL — ABNORMAL HIGH (ref 70–99)
Glucose-Capillary: 107 mg/dL — ABNORMAL HIGH (ref 70–99)
Glucose-Capillary: 127 mg/dL — ABNORMAL HIGH (ref 70–99)
Glucose-Capillary: 164 mg/dL — ABNORMAL HIGH (ref 70–99)
Glucose-Capillary: 166 mg/dL — ABNORMAL HIGH (ref 70–99)
Glucose-Capillary: 181 mg/dL — ABNORMAL HIGH (ref 70–99)
Glucose-Capillary: 96 mg/dL (ref 70–99)

## 2019-12-19 LAB — BASIC METABOLIC PANEL
Anion gap: 9 (ref 5–15)
BUN: 39 mg/dL — ABNORMAL HIGH (ref 8–23)
CO2: 33 mmol/L — ABNORMAL HIGH (ref 22–32)
Calcium: 8.6 mg/dL — ABNORMAL LOW (ref 8.9–10.3)
Chloride: 99 mmol/L (ref 98–111)
Creatinine, Ser: 0.45 mg/dL — ABNORMAL LOW (ref 0.61–1.24)
GFR calc Af Amer: >60 mL/min (ref >60)
GFR calc non Af Amer: >60 mL/min (ref >60)
Glucose, Bld: 115 mg/dL — ABNORMAL HIGH (ref 70–99)
Potassium: 3.5 mmol/L (ref 3.5–5.1)
Sodium: 141 mmol/L (ref 135–145)

## 2019-12-19 LAB — PROTIME-INR
INR: 1.1 (ref 0.8–1.2)
Prothrombin Time: 14 seconds (ref 11.4–15.2)

## 2019-12-19 MED ORDER — POTASSIUM CHLORIDE 20 MEQ/15ML (10%) PO SOLN
40.0000 meq | Freq: Once | ORAL | Status: AC
  Administered 2019-12-19: 40 meq
  Filled 2019-12-19: qty 30

## 2019-12-19 MED ORDER — FUROSEMIDE 10 MG/ML IJ SOLN
60.0000 mg | Freq: Four times a day (QID) | INTRAMUSCULAR | Status: AC
  Administered 2019-12-19 (×2): 60 mg via INTRAVENOUS
  Filled 2019-12-19 (×2): qty 6

## 2019-12-19 MED ORDER — ENOXAPARIN SODIUM 40 MG/0.4ML ~~LOC~~ SOLN
40.0000 mg | Freq: Once | SUBCUTANEOUS | Status: AC
  Administered 2019-12-19: 40 mg via SUBCUTANEOUS
  Filled 2019-12-19: qty 0.4

## 2019-12-19 MED ORDER — FENTANYL 75 MCG/HR TD PT72
2.0000 | MEDICATED_PATCH | TRANSDERMAL | Status: DC
  Administered 2019-12-19 – 2019-12-28 (×4): 2 via TRANSDERMAL
  Filled 2019-12-19: qty 2
  Filled 2019-12-19 (×2): qty 1
  Filled 2019-12-19 (×2): qty 2

## 2019-12-19 MED ORDER — ENOXAPARIN SODIUM 40 MG/0.4ML ~~LOC~~ SOLN
40.0000 mg | SUBCUTANEOUS | Status: DC
  Administered 2019-12-20 – 2020-01-06 (×18): 40 mg via SUBCUTANEOUS
  Filled 2019-12-19 (×18): qty 0.4

## 2019-12-19 NOTE — Progress Notes (Signed)
Physical Therapy Wound Treatment Patient Details  Name: [name] MRN: [id number] Date of Birth: Nov 17, 1955  Today's Date: 12/19/2019 Time: 1128-1210 Time Calculation (min): 42 min  Subjective  Subjective: RN placed pt back on vent support for treatment session  Patient and Family Stated Goals: intubated and sedated Date of Onset: (unknown) Prior Treatments: moist gauze and santyl   Pain Score:  Pt premedicated. Pt with increase in HR to 144 bpm with turning, however settled in 120s during pulse lavage and debridement.    Wound Assessment  Pressure Injury 12/12/19 Coccyx Mid Unstageable - Full thickness tissue loss in which the base of the injury is covered by slough (yellow, tan, gray, green or brown) and/or eschar (tan, brown or black) in the wound bed. (Active)  Wound Image   12/17/19 1400  Dressing Type ABD;Moist to moist;Other (Comment);Barrier Film (skin prep) 12/19/19 1700  Dressing Changed 12/19/19 1700  Dressing Change Frequency Daily 12/19/19 1700  State of Healing Eschar 12/19/19 1700  Site / Wound Assessment Bleeding;Black;Purple;Red;Yellow 12/19/19 1700  % Wound base Red or Granulating 15% 12/19/19 1700  % Wound base Yellow/Fibrinous Exudate 85% 12/19/19 1700  % Wound base Black/Eschar 90% 12/16/19 1000  Peri-wound Assessment Erythema (blanchable) 12/19/19 1700  Wound Length (cm) 8.5 cm 12/17/19 1400  Wound Width (cm) 7.5 cm 12/17/19 1400  Wound Surface Area (cm^2) 63.75 cm^2 12/17/19 1400  Margins Unattached edges (unapproximated) 12/19/19 1700  Drainage Amount Moderate 12/19/19 1700  Drainage Description Serosanguineous;Green;Odor 12/19/19 1700  Treatment Debridement (Selective);Hydrotherapy (Pulse lavage);Packing (Saline gauze) 12/19/19 1700  Santyl applied to necrotic tissue.  Hydrotherapy Pulsed lavage therapy - wound location: sacral wound Pulsed Lavage with Suction (psi): 12 psi Pulsed Lavage with Suction - Normal Saline Used: 1000 mL Pulsed Lavage Tip:  Tip with splash shield Selective Debridement Selective Debridement - Location: sacral wound Selective Debridement - Tools Used: Forceps;Scalpel Selective Debridement - Tissue Removed: eschar softened by santyl and pulse lavage   Wound Assessment and Plan  Wound Therapy - Assess/Plan/Recommendations Wound Therapy - Clinical Statement: Pt with green malodrous drainage, WOC nurse to visualize wound tomorrow for possible addition of Dakin's solution to dressing change for possible Psuedomonas. Pt will continue to benefit from pulse lavage and debridement for decreasing bioburden to improve healing.  Wound Therapy - Functional Problem List: sedated, and immobile Factors Delaying/Impairing Wound Healing: Immobility;Incontinence;Multiple medical problems Hydrotherapy Plan: Debridement;Pulsatile lavage with suction;Patient/family education Wound Therapy - Frequency: 6X / week Wound Therapy - Current Recommendations: WOC nurse Wound Therapy - Follow Up Recommendations: Skilled nursing facility Wound Plan: see above  Wound Therapy Goals- Improve the function of patient's integumentary system by progressing the wound(s) through the phases of wound healing (inflammation - proliferation - remodeling) by: Decrease Necrotic Tissue to: 5 Decrease Necrotic Tissue - Progress: Progressing toward goal Increase Granulation Tissue to: 95 Increase Granulation Tissue - Progress: Progressing toward goal Decrease Length/Width/Depth by (cm): 10% Decrease Length/Width/Depth - Progress: Progressing toward goal Goals/treatment plan/discharge plan were made with and agreed upon by patient/family: No, Patient unable to participate in goals/treatment/discharge plan and family unavailable Time For Goal Achievement: 7 days Wound Therapy - Potential for Goals: Good  Goals will be updated until maximal potential achieved or discharge criteria met.  Discharge criteria: when goals achieved, discharge from hospital, MD  decision/surgical intervention, no progress towards goals, refusal/missing three consecutive treatments without notification or medical reason.  GP    Dani Gobble. Migdalia Dk PT, DPT Acute Rehabilitation Services Pager [phone] Office [phone]  Benjamine Mola  B [place] 12/19/2019, 5:20 PM   

## 2019-12-19 NOTE — Progress Notes (Signed)
K was 3.5 and replaced per protocol  [facility]

## 2019-12-19 NOTE — Progress Notes (Signed)
PT Cancellation Note  Patient Details Name: [name] MRN: [id number] DOB: [birth year]   Cancelled Treatment:    Reason Eval/Treat Not Completed: Patient not medically ready.  Hold today per RN, pt already working too hard to participate. 12/19/2019  Jacinto Halim., PT Acute Rehabilitation Services [phone]  (pager) [phone]  (office)   [name] 12/19/2019, 3:04 PM

## 2019-12-19 NOTE — Progress Notes (Signed)
NAME:  [name], MRN:  [id number], DOB:  1955-11-07, LOS: 90 ADMISSION DATE:  November 22, 2019, CONSULTATION DATE:  4/5 REFERRING MD:  Sloan Leiter, CHIEF COMPLAINT:  Dyspnea   Brief History   64 year old man admitted 4/2 with COVID pneumonia and delirium after 1 week of symptoms.  Intubated on 4/5, required prolonged mechanical ventilatory support.  Tracheostomy performed on 4/20  Past Medical History    Allergic rhinitis   . Arthritis of left acromioclavicular joint 07/25/2014  . Asthma   . Chronic bronchitis ([place])   . Chronic kidney disease    H/O STONES  . Colon polyp   . Coronary artery disease   . Diabetes mellitus without complication ([place])   . Diverticula of colon   . GERD (gastroesophageal reflux disease)   . Hx of heart bypass surgery 2005  . Hypercholesteremia   . Impingement syndrome of left shoulder 07/25/2014  . Laceration of brachial artery 03/11/2016   left arm  . Lumbar disc disease   . Partial nontraumatic rupture of right rotator cuff 10/20/2016    Significant Hospital Events   4/19: tachypneic overnight and this am. Unable to wean sedation. Tracheostomy done  4/20: successful trach yesterday but de-recruited and on some escalated settings today. Remains tachypneic this am. Hypoglycemic yesterday and lantus changed 4/22: started on empiric abx but resp cx with abundant gpc. Improving oxygenation requirement but remains with rass -4 despite light sedation.  4/23:mucous plugging overnight and this am with copious/thick secretions.  4/28: issues overnight with increased vent settings. Still without purposeful responses. Fever overnight as well to 101.1. remains minimally responsive and unable to wean iv sedation except for short periods, despite increase in po agents.  4/29: somewhat improved settings overnight on 60/8. Still with temps overnight to 100.7 5/1: Continued improvement in vent requirement 50/8, remains on several sedating medications including  continuous fentanyl and precedex. Trach changed to 6.0 shiley XLT. 5/7 tolerating pressure support ventilation while on sedation  Consults:    Procedures:  04/05 Rt CVC > 4/24 04/16 Bronchoscopy  04/20Tracheostomy 4/24 Rt PICC  Significant Diagnostic Tests:  4/22 CXR: 1. Lines and tubes stable position. 2. Persistent bibasilar pulmonary infiltrates/edema with interim slight clearing from prior exam. 3. Prior CABG. Heart size stable.  4/24 CXR: Slight increase in interstitial and airspace opacities particularly in the lower lobes. Findings suspicious for worsening asymmetric edema or infection.  Head CT 4/27 Mild chronic ischemic white matter disease. No acute intracranial abnormality seen.  Abdominal CT 4/27 > for PEG, trace pneumoperitoneum, extensive airspace disease due to COVID 19, kidney stone left, non-obstructing  MRI brain 5/3 > NAICP, chronic small vessel ischemia, chronic microhemorrhages, stable mild generalized atrophy, ethmoid sinus thickening, bilateral mastoid effusions  Chest CT 5/11 > 1. Pulmonary fibrosis with UIP pattern, new from 2015. 2. There is lower lobe atelectasis. No edema or convincing pneumonia.  Micro Data:  COVID 4/2 >Positive  Blood cultures 4/2 > negative  Respiratory culture 4/5 > Negative  Respiratory culture 4/12 > Normal flora  Respiratory culture 4/21 > Normal flora   Antimicrobials:  4/21 Vancomycin > 4/23 4/21 Zosyn > 4/26  Interim history/subjective:  Attempting to wean as tolerated  Objective   Blood pressure 114/61, pulse 74, temperature 98.1 F (36.7 C), temperature source Axillary, resp. rate 20, height 5\' 8"  (1.727 m), weight 82 kg, SpO2 99 %.    Vent Mode: CPAP;PSV FiO2 (%):  [40 %-100 %] 50 % Set Rate:  [20 bmp] 20 bmp  Vt Set:  [480 mL] 480 mL PEEP:  [5 cmH20] 5 cmH20 Pressure Support:  [12 cmH20-15 cmH20] 12 cmH20 Plateau Pressure:  [15 cmH20-25 cmH20] 15 cmH20   Intake/Output Summary (Last 24  hours) at 12/19/2019 0900 Last data filed at 12/19/2019 0801 Gross per 24 hour  Intake 2701.64 ml  Output 1750 ml  Net 951.64 ml   Filed Weights   12/17/19 0421 12/18/19 0500 12/19/19 0436  Weight: 80.2 kg 79.8 kg 82 kg    Examination: General: Frail elderly male HEENT: Tracheostomy in place Neuro: Currently lethargic CV: Sinus rhythm PULM: Coarse rhonchi bilaterally Vent CPAP pressure support FIO2 40% PEEP 5 RATE 20 VT 480  GI: soft, bsx4 active  GU: Extremities: warm/dry,  edema  Skin: no rashes or lesions   Resolved Hospital Problem list   COVID-19 pneumonia -Has completed full course of remdesivir and dexamethasone, off isolation 4/23.  HCAP Pneumomediastinum/pneumopericardium  Assessment & Plan:   Acute respiratory failure with hypoxemia due to COVID 19  Severe respiratory muscle weakness -Requiring prolonged mechanical wean  Post COVID pulmonary fibrosis  -Pulmonary fibrosis with UIP pattern on chest CT 5/11 P: Continue to wean as tolerated Attempting to find adequate sedation to allow debridement but anxiety but does not over sedated  Acute toxic metabolic encephalopathy -MRI brain with some chronic changes but no large cerebral injury, EEG without epileptiform activity Suspect oxycodone intolerance, transitioning to fentanyl patch Severe ICU myopathy On multiple neurologic and centrally acting drugs P: Increase fentanyl patch 150 mcg daily Continue p.o. cocktail  Positive Cumulative Fluid Balance  -Continue to remains ~10L positive, patient doesn't appear grossly volume overloaded   Intake/Output Summary (Last 24 hours) at 12/19/2019 0902 Last data filed at 12/19/2019 0801 Gross per 24 hour  Intake 2701.64 ml  Output 1750 ml  Net 951.64 ml   Lab Results  Component Value Date   CREATININE 0.45 (L) 12/19/2019   CREATININE 0.54 (L) 12/18/2019   CREATININE 0.51 (L) 12/17/2019   CREATININE 0.90 02/19/2013   CREATININE 1.03 12/28/2012      P: Diuresis as tolerated  History of asthma P: As needed bronchodilator  DM2 with hyperglycemia -CBG seem well controled on current regiment  CBG (last 3)  Recent Labs    12/19/19 0006 12/19/19 0347 12/19/19 0743  GLUCAP 166* 96 127*    P: Sliding-scale insulin protocol  Urinary retention -External catheter in place  P:  Continue Cardura Monitor  Goals of care:  Needs LTAC placement   Best practice:  Diet: continue tube feeding Pain/Anxiety/Delirium protocol (if indicated): as above VAP protocol (if indicated): yes DVT prophylaxis: lovenox 40mg  daily GI prophylaxis: PPI Glucose control: Lantus+ SSI Mobility: needs mobility attempts Code Status: full Family Communication: Update family daily Disposition: remain in ICU  Labs   CBC: Recent Labs  Lab 12/13/19 0447 12/13/19 0447 12/14/19 0436 12/14/19 0436 12/15/19 0421 12/17/19 0003 12/17/19 0415 12/18/19 0459 12/18/19 1232  WBC 9.3  --  10.8*  --  11.7*  --  14.7* 11.9*  --   NEUTROABS 4.5  --  5.6  --   --   --   --  5.4  --   HGB 8.3*   < > 9.2*   < > 8.6* 10.2* 9.1* 8.7* 9.9*  HCT 26.9*   < > 29.4*   < > 28.4* 30.0* 29.7* 28.3* 29.0*  MCV 93.4  --  92.2  --  93.1  --  93.7 93.1  --   PLT 290  --  315  --  337  --  395 344  --    < > = values in this interval not displayed.    Basic Metabolic Panel: Recent Labs  Lab 12/15/19 0421 12/15/19 0421 12/16/19 1030 12/16/19 1030 12/17/19 0003 12/17/19 0003 12/17/19 0415 12/18/19 0459 12/18/19 1232 12/18/19 1722 12/19/19 0438  NA 139   < > 139   < > 136  --  139 140 140  --  141  K 3.6   < > 4.3   < > 3.3*   < > 3.6 2.9* 4.2 3.9 3.5  CL 89*  --  90*  --   --   --  91* 96*  --   --  99  CO2 39*  --  38*  --   --   --  37* 34*  --   --  33*  GLUCOSE 221*  --  158*  --   --   --  209* 154*  --   --  115*  BUN 22  --  25*  --   --   --  34* 38*  --   --  39*  CREATININE 0.49*  --  0.50*  --   --   --  0.51* 0.54*  --   --  0.45*  CALCIUM  8.8*  --  8.7*  --   --   --  8.9 8.6*  --   --  8.6*  MG 2.1  --   --   --   --   --   --   --   --   --   --    < > = values in this interval not displayed.   GFR: Estimated Creatinine Clearance: 89.1 mL/min (A) (by C-G formula based on SCr of 0.45 mg/dL (L)). Recent Labs  Lab 12/14/19 0436 12/15/19 0421 12/17/19 0415 12/18/19 0459  WBC 10.8* 11.7* 14.7* 11.9*    Liver Function Tests: Recent Labs  Lab 12/13/19 0447 12/14/19 0436  AST 18 20  ALT 14 13  ALKPHOS 51 57  BILITOT 0.5 0.6  PROT 5.6* 6.3*  ALBUMIN 1.5* 1.7*   No results for input(s): LIPASE, AMYLASE in the last 168 hours. No results for input(s): AMMONIA in the last 168 hours.  ABG    Component Value Date/Time   PHART 7.449 12/18/2019 1232   PCO2ART 52.5 (H) 12/18/2019 1232   PO2ART 68 (L) 12/18/2019 1232   HCO3 36.4 (H) 12/18/2019 1232   TCO2 38 (H) 12/18/2019 1232   ACIDBASEDEF 1.0 11/11/2019 1529   O2SAT 94.0 12/18/2019 1232     Coagulation Profile: Recent Labs  Lab 12/19/19 0438  INR 1.1    Cardiac Enzymes: No results for input(s): CKTOTAL, CKMB, CKMBINDEX, TROPONINI in the last 168 hours.  HbA1C: Hgb A1c MFr Bld  Date/Time Value Ref Range Status  11/17/2019 06:51 PM 7.5 (H) 4.8 - 5.6 % Final    Comment:    (NOTE) Pre diabetes:          5.7%-6.4% Diabetes:              >6.4% Glycemic control for   <7.0% adults with diabetes   12/19/2018 02:21 PM 7.7 (H) 4.6 - 6.5 % Final    Comment:    Glycemic Control Guidelines for People with Diabetes:Non Diabetic:  <6%Goal of Therapy: <7%Additional Action Suggested:  >8%     CBG: Recent Labs  Lab 12/18/19 1610 12/18/19 1939 12/19/19 0006 12/19/19  0347 12/19/19 0743  GLUCAP 143* 214* 166* 96 127*    CRITICAL CARE na   Brett Canales Macallister Ashmead ACNP Acute Care Nurse Practitioner [facility] Pulmonary/Critical Care Please consult Amion 12/19/2019, 9:00 AM

## 2019-12-19 NOTE — TOC Progression Note (Signed)
Transition of Care [facility]) - Progression Note    Patient Details  Name: [name] MRN: [id number] Date of Birth: [birth year]  [facility]) CM/SW Contact  Dewayne Jurek, Manfred Arch, RN Phone Number: 12/19/2019, 11:14 AM  Clinical Narrative:   CM dicussed case with attending - pt is now appropriate for [facility] insurance auth. [employer] will pursue auth today.         Expected Discharge Plan and Services                                                 Social Determinants of Health (SDOH) Interventions    Readmission Risk Interventions No flowsheet data found.

## 2019-12-19 NOTE — Progress Notes (Signed)
Attempted to call for pt to come down to IR for procedure. RN on floor states that RT is tied up this morning and will not be available until 1400. I advised that we have a lengthy procedure scheduled at 1400 today and this would delay the procedure being done. Dr Archer Asa aware, Rad PA aware. Will attempt to call for pt tomorrow as IR schedule allows. Per PA, Behnke resume tube feeds

## 2019-12-20 ENCOUNTER — Inpatient Hospital Stay (HOSPITAL_COMMUNITY): Payer: Medicare Other

## 2019-12-20 DIAGNOSIS — D72829 Elevated white blood cell count, unspecified: Secondary | ICD-10-CM

## 2019-12-20 LAB — GLUCOSE, CAPILLARY
Glucose-Capillary: 107 mg/dL — ABNORMAL HIGH (ref 70–99)
Glucose-Capillary: 107 mg/dL — ABNORMAL HIGH (ref 70–99)
Glucose-Capillary: 129 mg/dL — ABNORMAL HIGH (ref 70–99)
Glucose-Capillary: 138 mg/dL — ABNORMAL HIGH (ref 70–99)
Glucose-Capillary: 145 mg/dL — ABNORMAL HIGH (ref 70–99)
Glucose-Capillary: 152 mg/dL — ABNORMAL HIGH (ref 70–99)

## 2019-12-20 LAB — URINALYSIS, ROUTINE W REFLEX MICROSCOPIC
Bilirubin Urine: NEGATIVE
Glucose, UA: NEGATIVE mg/dL
Hgb urine dipstick: NEGATIVE
Ketones, ur: NEGATIVE mg/dL
Nitrite: POSITIVE — AB
Protein, ur: NEGATIVE mg/dL
Specific Gravity, Urine: 1.015 (ref 1.005–1.030)
WBC, UA: >50 WBC/hpf — ABNORMAL HIGH (ref 0–5)
pH: 6 (ref 5.0–8.0)

## 2019-12-20 LAB — BASIC METABOLIC PANEL
Anion gap: 7 (ref 5–15)
BUN: 52 mg/dL — ABNORMAL HIGH (ref 8–23)
CO2: 36 mmol/L — ABNORMAL HIGH (ref 22–32)
Calcium: 8.7 mg/dL — ABNORMAL LOW (ref 8.9–10.3)
Chloride: 100 mmol/L (ref 98–111)
Creatinine, Ser: 0.51 mg/dL — ABNORMAL LOW (ref 0.61–1.24)
GFR calc Af Amer: >60 mL/min (ref >60)
GFR calc non Af Amer: >60 mL/min (ref >60)
Glucose, Bld: 116 mg/dL — ABNORMAL HIGH (ref 70–99)
Potassium: 3.5 mmol/L (ref 3.5–5.1)
Sodium: 143 mmol/L (ref 135–145)

## 2019-12-20 LAB — CBC
HCT: 30.5 % — ABNORMAL LOW (ref 39.0–52.0)
Hemoglobin: 9.4 g/dL — ABNORMAL LOW (ref 13.0–17.0)
MCH: 28.7 pg (ref 26.0–34.0)
MCHC: 30.8 g/dL (ref 30.0–36.0)
MCV: 93 fL (ref 80.0–100.0)
Platelets: 362 10*3/uL (ref 150–400)
RBC: 3.28 MIL/uL — ABNORMAL LOW (ref 4.22–5.81)
RDW: 15.4 % (ref 11.5–15.5)
WBC: 16.2 10*3/uL — ABNORMAL HIGH (ref 4.0–10.5)
nRBC: 0 % (ref 0.0–0.2)

## 2019-12-20 LAB — MAGNESIUM: Magnesium: 2 mg/dL (ref 1.7–2.4)

## 2019-12-20 LAB — PHOSPHORUS: Phosphorus: 5.1 mg/dL — ABNORMAL HIGH (ref 2.5–4.6)

## 2019-12-20 MED ORDER — ALTEPLASE 2 MG IJ SOLR
2.0000 mg | Freq: Once | INTRAMUSCULAR | Status: AC
  Administered 2019-12-20: 2 mg
  Filled 2019-12-20: qty 2

## 2019-12-20 MED ORDER — DAKINS (1/4 STRENGTH) 0.125 % EX SOLN
Freq: Every day | CUTANEOUS | Status: AC
  Filled 2019-12-20: qty 473

## 2019-12-20 MED ORDER — POTASSIUM CHLORIDE 20 MEQ/15ML (10%) PO SOLN
40.0000 meq | Freq: Once | ORAL | Status: AC
  Administered 2019-12-20: 40 meq via ORAL
  Filled 2019-12-20: qty 30

## 2019-12-20 NOTE — Care Management (Signed)
[facility] requesting a peer to peer for [facility] approval. CM informed CCM NP of requirement lhat needs to be completed by 12:30 pm today.    [id number] option 5

## 2019-12-20 NOTE — Progress Notes (Signed)
Physical Therapy Treatment Patient Details Name: [name] MRN: [id number] DOB: Dressel 15, 1957 Today's Date: 12/20/2019    History of Present Illness pt is a 64 y/o male with pmhx significant for CAD, DM, CABG, ACDF admitted 4/2 with COVID PNA and delirium, intubated 4/5, proned and paralyzed 4/5-4/9, sedated until 4/12, Bronchoscopy for tube change 4/16, 4/19 trached.  Remains sedated.    PT Comments    Pt opened his eye once his name was spoken on entering and they remained open 75% or more of the time.  Emphasis on transitioning to EOB and working to elicit truncal/extremity reactions or purposeful acknowledgement of the stimulus given.   Follow Up Recommendations  LTACH;SNF     Equipment Recommendations  Other (comment)(TBA)    Recommendations for Other Services       Precautions / Restrictions Precautions Precautions: Fall Precaution Comments: trach, flexiseal, coretrack, large sacral decubitus    Mobility  Bed Mobility Overal bed mobility: Needs Assistance Bed Mobility: Rolling;Sidelying to Sit;Sit to Supine Rolling: Total assist;+2 for physical assistance Sidelying to sit: Total assist;+2 for physical assistance   Sit to supine: Total assist;+2 for physical assistance   General bed mobility comments: light sedation, pt with eyes open  Transfers                    Ambulation/Gait                 Stairs             Wheelchair Mobility    Modified Rankin (Stroke Patients Only)       Balance Overall balance assessment: Needs assistance Sitting-balance support: Feet supported;Single extremity supported;Bilateral upper extremity supported Sitting balance-Leahy Scale: Zero Sitting balance - Comments: did not note any truncal reactions to sitting balance or purposeful reaction to challenges designed to elicit truncal reaction.                                    Cognition Arousal/Alertness: Lethargic Behavior During  Therapy: Flat affect Overall Cognitive Status: Difficult to assess                                        Exercises Other Exercises Other Exercises: warm up PROM to bil LE's    General Comments General comments (skin integrity, edema, etc.): VSS on the vent.  SpO2 steady at 98%, HR stayed in the 60's and low 70's  BP  :uomg was 87/59, but sitting at EOB, BP rose to 109/63/        Pertinent Vitals/Pain Pain Assessment: Faces Faces Pain Scale: No hurt Pain Intervention(s): Monitored during session    Home Living                      Prior Function            PT Goals (current goals can now be found in the care plan section) Acute Rehab PT Goals Patient Stated Goal: pt unable PT Goal Formulation: Patient unable to participate in goal setting Time For Goal Achievement: 12/30/19 Potential to Achieve Goals: Fair Progress towards PT goals: Progressing toward goals(limited)    Frequency    Min 2X/week      PT Plan Current plan remains appropriate    Co-evaluation  AM-PAC PT "6 Clicks" Mobility   Outcome Measure  Help needed turning from your back to your side while in a flat bed without using bedrails?: Total Help needed moving from lying on your back to sitting on the side of a flat bed without using bedrails?: Total Help needed moving to and from a bed to a chair (including a wheelchair)?: Total Help needed standing up from a chair using your arms (e.g., wheelchair or bedside chair)?: Total Help needed to walk in hospital room?: Total Help needed climbing 3-5 steps with a railing? : Total 6 Click Score: 6    End of Session   Activity Tolerance: Patient tolerated treatment well;Patient limited by fatigue Patient left: in bed;with call bell/phone within reach;with SCD's reapplied Nurse Communication: Mobility status PT Visit Diagnosis: Other abnormalities of gait and mobility (R26.89);Muscle weakness (generalized)  (M62.81);Adult, failure to thrive (R62.7)     Time: 9892-1194 PT Time Calculation (min) (ACUTE ONLY): 23 min  Charges:  $Therapeutic Activity: 23-37 mins                     12/20/2019  Jacinto Halim., PT Acute Rehabilitation Services [phone]  (pager) [phone]  (office)   [name] 12/20/2019, 4:19 PM

## 2019-12-20 NOTE — Progress Notes (Signed)
Physical Therapy Wound Treatment Patient Details  Name: [name] MRN: [id number] Date of Birth: 09/13/1955  Today's Date: 12/20/2019 Time: 2122-4825 Time Calculation (min): 47 min  Subjective  Subjective: [name] nurse present  Patient and Family Stated Goals: intubated and sedated Date of Onset: (unknown) Prior Treatments: moist gauze and santyl   Pain Score:  Pt on sedation no evidence of pain during session   Wound Assessment  Pressure Injury 12/12/19 Coccyx Mid Unstageable - Full thickness tissue loss in which the base of the injury is covered by slough (yellow, tan, gray, green or brown) and/or eschar (tan, brown or black) in the wound bed. (Active)  Wound Image   12/17/19 1400  Dressing Type ABD;Moist to moist;Other (Comment);Barrier Film (skin prep) 12/20/19 1700  Dressing Changed 12/20/19 1700  Dressing Change Frequency Daily 12/20/19 1700  State of Healing Eschar 12/20/19 1700  Site / Wound Assessment Bleeding;Black;Purple;Red;Yellow 12/20/19 1700  % Wound base Red or Granulating 15% 12/20/19 1700  % Wound base Yellow/Fibrinous Exudate 85% 12/20/19 1700  % Wound base Black/Eschar 90% 12/16/19 1000  Peri-wound Assessment Erythema (blanchable) 12/20/19 1700  Wound Length (cm) 8.5 cm 12/17/19 1400  Wound Width (cm) 7.5 cm 12/17/19 1400  Wound Surface Area (cm^2) 63.75 cm^2 12/17/19 1400  Margins Unattached edges (unapproximated) 12/20/19 1700  Drainage Amount Moderate 12/20/19 1700  Drainage Description Serosanguineous;Green;Odor 12/20/19 1700  Treatment Debridement (Selective);Hydrotherapy (Pulse lavage);Packing (Saline gauze);Other (Comment) 12/20/19 1700  Santyl applied to necrotic tissue Hydrotherapy Pulsed lavage therapy - wound location: sacral wound Pulsed Lavage with Suction (psi): 12 psi Pulsed Lavage with Suction - Normal Saline Used: 1000 mL Pulsed Lavage Tip: Tip with splash shield Selective Debridement Selective Debridement - Location: sacral wound Selective  Debridement - Tools Used: Forceps;Scalpel Selective Debridement - Tissue Removed: eschar softened by santyl and pulse lavage   Wound Assessment and Plan  Wound Therapy - Assess/Plan/Recommendations Wound Therapy - Clinical Statement: WOC nurse agrees to Dakins treatment for pseudomonas to start tomorrow. Appreciable amount of eschar removed today. Pt will continue to benefit from hydrotherapy to decreased bioburden and improve wound healing.  Wound Therapy - Functional Problem List: sedated, and immobile Factors Delaying/Impairing Wound Healing: Immobility;Incontinence;Multiple medical problems Hydrotherapy Plan: Debridement;Pulsatile lavage with suction;Patient/family education Wound Therapy - Frequency: 6X / week Wound Therapy - Follow Up Recommendations: Skilled nursing facility Wound Plan: see above  Wound Therapy Goals- Improve the function of patient's integumentary system by progressing the wound(s) through the phases of wound healing (inflammation - proliferation - remodeling) by: Decrease Necrotic Tissue to: 5 Decrease Necrotic Tissue - Progress: Progressing toward goal Increase Granulation Tissue to: 95 Increase Granulation Tissue - Progress: Progressing toward goal Decrease Length/Width/Depth by (cm): 10% Goals/treatment plan/discharge plan were made with and agreed upon by patient/family: No, Patient unable to participate in goals/treatment/discharge plan and family unavailable Time For Goal Achievement: 7 days Wound Therapy - Potential for Goals: Good  Goals will be updated until maximal potential achieved or discharge criteria met.  Discharge criteria: when goals achieved, discharge from hospital, MD decision/surgical intervention, no progress towards goals, refusal/missing three consecutive treatments without notification or medical reason.  GP    Dani Gobble. Migdalia Dk PT, DPT Acute Rehabilitation Services Pager [phone] Office [phone]  [place] 12/20/2019, 5:39 PM

## 2019-12-20 NOTE — Progress Notes (Signed)
Patient tentatively planned for g-tube placement today however patient now febrile with increasing WBC. Discussed with IR attending today who recommends postponing procedure until improved.   Will tentatively plan for procedure on Monday 5/17 pending re-evaluation of fever/leukocytosis.  Lynnette Caffey, PA-C

## 2019-12-20 NOTE — Progress Notes (Addendum)
NAME:  [name], MRN:  [id number], DOB:  01-Jun-1956, LOS: 42 ADMISSION DATE:  12/01/2019, CONSULTATION DATE:  4/5 REFERRING MD:  Jerral Glynn, CHIEF COMPLAINT:  Dyspnea   Brief History   64 year old man admitted 4/2 with COVID pneumonia and delirium after 1 week of symptoms.  Intubated on 4/5, required prolonged mechanical ventilatory support.  Tracheostomy performed on 4/20  Past Medical History    Allergic rhinitis   . Arthritis of left acromioclavicular joint 07/25/2014  . Asthma   . Chronic bronchitis (HCC)   . Chronic kidney disease    H/O STONES  . Colon polyp   . Coronary artery disease   . Diabetes mellitus without complication (HCC)   . Diverticula of colon   . GERD (gastroesophageal reflux disease)   . Hx of heart bypass surgery 2005  . Hypercholesteremia   . Impingement syndrome of left shoulder 07/25/2014  . Laceration of brachial artery 03/11/2016   left arm  . Lumbar disc disease   . Partial nontraumatic rupture of right rotator cuff 10/20/2016    Significant Hospital Events   4/19: tachypneic overnight and this am. Unable to wean sedation. Tracheostomy done  4/20: successful trach yesterday but de-recruited and on some escalated settings today. Remains tachypneic this am. Hypoglycemic yesterday and lantus changed 4/22: started on empiric abx but resp cx with abundant gpc. Improving oxygenation requirement but remains with rass -4 despite light sedation.  4/23:mucous plugging overnight and this am with copious/thick secretions.  4/28: issues overnight with increased vent settings. Still without purposeful responses. Fever overnight as well to 101.1. remains minimally responsive and unable to wean iv sedation except for short periods, despite increase in po agents.  4/29: somewhat improved settings overnight on 60/8. Still with temps overnight to 100.7 5/1: Continued improvement in vent requirement 50/8, remains on several sedating medications including  continuous fentanyl and precedex. Trach changed to 6.0 shiley XLT. 5/7 tolerating pressure support ventilation while on sedation  Consults:    Procedures:  04/05 Rt CVC > 4/24 04/16 Bronchoscopy  04/20Tracheostomy 4/24 Rt PICC  Significant Diagnostic Tests:  4/22 CXR: 1. Lines and tubes stable position. 2. Persistent bibasilar pulmonary infiltrates/edema with interim slight clearing from prior exam. 3. Prior CABG. Heart size stable.  4/24 CXR: Slight increase in interstitial and airspace opacities particularly in the lower lobes. Findings suspicious for worsening asymmetric edema or infection.  Head CT 4/27 Mild chronic ischemic white matter disease. No acute intracranial abnormality seen.  Abdominal CT 4/27 > for PEG, trace pneumoperitoneum, extensive airspace disease due to COVID 19, kidney stone left, non-obstructing  MRI brain 5/3 > NAICP, chronic small vessel ischemia, chronic microhemorrhages, stable mild generalized atrophy, ethmoid sinus thickening, bilateral mastoid effusions  Chest CT 5/11 > 1. Pulmonary fibrosis with UIP pattern, new from 2015. 2. There is lower lobe atelectasis. No edema or convincing pneumonia.  Micro Data:  COVID 4/2 >Positive  Blood cultures 4/2 > negative  Respiratory culture 4/5 > Negative  Respiratory culture 4/12 > Normal flora  Respiratory culture 4/21 > Normal flora   Antimicrobials:  4/21 Vancomycin > 4/23 4/21 Zosyn > 4/26  Interim history/subjective:  Weaned all day 5/13 on sedation For PEG placement 5/14 afternoon. NPO since midnight + 8 L T max 101 last 24 WBC 16.2 CXR 5/14 Chronic interstitial lung disease without definite superimposed acute cardiopulmonary process. No change from prior study  Objective   Blood pressure 114/69, pulse 65, temperature 98.2 F (36.8 C), temperature source  Oral, resp. rate 20, height 5\' 8"  (1.727 m), weight 78.5 kg, SpO2 99 %.    Vent Mode: PRVC FiO2 (%):  [50 %] 50 % Set  Rate:  [20 bmp] 20 bmp Vt Set:  [480 mL] 480 mL PEEP:  [5 cmH20] 5 cmH20 Pressure Support:  [12 cmH20] 12 cmH20 Plateau Pressure:  [19 cmH20-26 cmH20] 26 cmH20   Intake/Output Summary (Last 24 hours) at 12/20/2019 0906 Last data filed at 12/20/2019 0800 Gross per 24 hour  Intake 2325.92 ml  Output 1580 ml  Net 745.92 ml   Filed Weights   12/18/19 0500 12/19/19 0436 12/20/19 0222  Weight: 79.8 kg 82 kg 78.5 kg    Examination: General: Frail elderly male, sedated and trached on mechanical ventilation, NAD HEENT: Tracheostomy secure and intact , good placement, Pink stoma Neuro: Sedated and trached, follows commands at intervals per nursing CV: S1, S2, RRR, No RMG PULM: Bilateral chest excursion, Coarse rhonchi bilaterally, diminished per bases FIO2 40% PEEP 5 RATE 20 VT 480  GI: soft, bsx4 active  GU: Extremities: warm/dry,  edema  Skin: no rashes or lesions   Resolved Hospital Problem list   COVID-19 pneumonia -Has completed full course of remdesivir and dexamethasone, off isolation 4/23.  HCAP Pneumomediastinum/pneumopericardium  Assessment & Plan:   Acute respiratory failure with hypoxemia due to COVID 19  Severe respiratory muscle weakness -Requiring prolonged mechanical wean  Post COVID pulmonary fibrosis  -Pulmonary fibrosis with UIP pattern on chest CT 5/11 P: Continue to wean as tolerated Attempting to find adequate sedation to allow debridement but anxiety but does not over sedated Seroquel started , but still requiring IV precedex and Fentanyl  New Fever ( 101.1 with increase in WBC 5/14 to 16.2) CXR no change Plan Sputum for Cx Blood Cx x 2 from Peripheral site UA to assess for Nitrites/ bacteria  Acute toxic metabolic encephalopathy -MRI brain with some chronic changes but no large cerebral injury, EEG without epileptiform activity Suspect oxycodone intolerance, transitioning to fentanyl patch Severe ICU myopathy On multiple neurologic and  centrally acting drugs P: Increase fentanyl patch 150 mcg daily Continue p.o. cocktail  Positive Cumulative Fluid Balance  -Continue to remains  8 L positive, patient doesn't appear grossly volume overloaded   Intake/Output Summary (Last 24 hours) at 12/20/2019 0906 Last data filed at 12/20/2019 0800 Gross per 24 hour  Intake 2325.92 ml  Output 1580 ml  Net 745.92 ml   Lab Results  Component Value Date   CREATININE 0.51 (L) 12/20/2019   CREATININE 0.45 (L) 12/19/2019   CREATININE 0.54 (L) 12/18/2019   CREATININE 0.90 02/19/2013   CREATININE 1.03 12/28/2012     P: Diuresis as tolerated  History of asthma P: Continue As needed bronchodilator  DM2 with hyperglycemia -CBG seem well controled on current regiment  CBG (last 3)  Recent Labs    12/19/19 2357 12/20/19 0333 12/20/19 0744  GLUCAP 181* 107* 129*    P: Sliding-scale insulin protocol  Urinary retention -External catheter in place  P:  Continue Cardura Monitor  Goals of care:  Needs LTAC placement   Best practice:  Diet: continue tube feeding Pain/Anxiety/Delirium protocol (if indicated): as above VAP protocol (if indicated): yes DVT prophylaxis: lovenox 40mg  daily GI prophylaxis: PPI Glucose control: Lantus+ SSI Mobility: needs mobility attempts Code Status: full Family Communication: Update family daily Disposition: remain in ICU  Labs   CBC: Recent Labs  Lab 12/14/19 0436 12/14/19 0436 12/15/19 0421 12/15/19 0421 12/17/19 0003 12/17/19 0415 12/18/19  1017 12/18/19 1232 12/20/19 0424  WBC 10.8*  --  11.7*  --   --  14.7* 11.9*  --  16.2*  NEUTROABS 5.6  --   --   --   --   --  5.4  --   --   HGB 9.2*   < > 8.6*   < > 10.2* 9.1* 8.7* 9.9* 9.4*  HCT 29.4*   < > 28.4*   < > 30.0* 29.7* 28.3* 29.0* 30.5*  MCV 92.2  --  93.1  --   --  93.7 93.1  --  93.0  PLT 315  --  337  --   --  395 344  --  362   < > = values in this interval not displayed.    Basic Metabolic Panel: Recent  Labs  Lab 12/15/19 0421 12/15/19 0421 12/16/19 1030 12/17/19 0003 12/17/19 0415 12/17/19 0415 12/18/19 0459 12/18/19 1232 12/18/19 1722 12/19/19 0438 12/20/19 0424  NA 139   < > 139   < > 139  --  140 140  --  141 143  K 3.6   < > 4.3   < > 3.6   < > 2.9* 4.2 3.9 3.5 3.5  CL 89*   < > 90*  --  91*  --  96*  --   --  99 100  CO2 39*   < > 38*  --  37*  --  34*  --   --  33* 36*  GLUCOSE 221*   < > 158*  --  209*  --  154*  --   --  115* 116*  BUN 22   < > 25*  --  34*  --  38*  --   --  39* 52*  CREATININE 0.49*   < > 0.50*  --  0.51*  --  0.54*  --   --  0.45* 0.51*  CALCIUM 8.8*   < > 8.7*  --  8.9  --  8.6*  --   --  8.6* 8.7*  MG 2.1  --   --   --   --   --   --   --   --   --  2.0  PHOS  --   --   --   --   --   --   --   --   --   --  5.1*   < > = values in this interval not displayed.   GFR: Estimated Creatinine Clearance: 89.1 mL/min (A) (by C-G formula based on SCr of 0.51 mg/dL (L)). Recent Labs  Lab 12/15/19 0421 12/17/19 0415 12/18/19 0459 12/20/19 0424  WBC 11.7* 14.7* 11.9* 16.2*    Liver Function Tests: Recent Labs  Lab 12/14/19 0436  AST 20  ALT 13  ALKPHOS 57  BILITOT 0.6  PROT 6.3*  ALBUMIN 1.7*   No results for input(s): LIPASE, AMYLASE in the last 168 hours. No results for input(s): AMMONIA in the last 168 hours.  ABG    Component Value Date/Time   PHART 7.449 12/18/2019 1232   PCO2ART 52.5 (H) 12/18/2019 1232   PO2ART 68 (L) 12/18/2019 1232   HCO3 36.4 (H) 12/18/2019 1232   TCO2 38 (H) 12/18/2019 1232   ACIDBASEDEF 1.0 11/11/2019 1529   O2SAT 94.0 12/18/2019 1232     Coagulation Profile: Recent Labs  Lab 12/19/19 0438  INR 1.1    Cardiac Enzymes: No results for input(s): CKTOTAL, CKMB, CKMBINDEX,  TROPONINI in the last 168 hours.  HbA1C: Hgb A1c MFr Bld  Date/Time Value Ref Range Status  2019/12/03 06:51 PM 7.5 (H) 4.8 - 5.6 % Final    Comment:    (NOTE) Pre diabetes:          5.7%-6.4% Diabetes:               >6.4% Glycemic control for   <7.0% adults with diabetes   12/19/2018 02:21 PM 7.7 (H) 4.6 - 6.5 % Final    Comment:    Glycemic Control Guidelines for People with Diabetes:Non Diabetic:  <6%Goal of Therapy: <7%Additional Action Suggested:  >8%     CBG: Recent Labs  Lab 12/19/19 1540 12/19/19 1940 12/19/19 2357 12/20/19 0333 12/20/19 0744  GLUCAP 107* 104* 181* 107* 129*    I did a peer to peer with [facility] MD regarding LTAC placement. Despite the fact the patient is weaning , and mental status has improved, they have denied him placement due to fever of 101.1 with increased WBC within the last 24 hours. Per the MD, Medicare rules are clear, and he cannot have any ongoing acute medical issues when being assessed for  admission to LTAC. I have told them we will call for reassessment once fever and WBC have returned to within normal parameters, as he is very appropriate for LTAC placement.   CRITICAL CARE : NA   Bevelyn Ngo, AGACNP Acute Care Nurse Practitioner [facility] Please consult Amion 12/20/2019, 9:06 AM

## 2019-12-20 NOTE — Consult Note (Signed)
WOC Nurse wound follow up Wound type: Unstageable sacrococcygeal wound. Hydrotherapy was initiated and PT asked to evaluate wounds due to bright green drainage. I've requested wound culture.  After culture is obtained recommend three days of Dakins solution.   Measurement: Wound bed:7 cm x 7.5 cm 100% devitalized tissue with bright green drainage noted.  Drainage (amount, consistency, odor) moderate green drainage with musty odor.  Periwound:intact Dressing procedure/placement/frequency: Cleanse sacral wound with NS and pat dry.  Obtain wound culture THEN initiate Dakins moist kerlix to wound bed. Secure with ABD pad and tape.  Change daily x 3 days then re-evaluate.  Will not follow at this time.  Please re-consult if needed.  Evan Hudson MSN, RN, FNP-BC CWON Wound, Ostomy, Continence Nurse Pager [phone]

## 2019-12-21 ENCOUNTER — Inpatient Hospital Stay (HOSPITAL_COMMUNITY): Payer: Medicare Other

## 2019-12-21 LAB — CBC WITH DIFFERENTIAL/PLATELET
Abs Immature Granulocytes: 0.21 10*3/uL — ABNORMAL HIGH (ref 0.00–0.07)
Basophils Absolute: 0.1 10*3/uL (ref 0.0–0.1)
Basophils Relative: 1 %
Eosinophils Absolute: 3.8 10*3/uL — ABNORMAL HIGH (ref 0.0–0.5)
Eosinophils Relative: 25 %
HCT: 29.3 % — ABNORMAL LOW (ref 39.0–52.0)
Hemoglobin: 8.8 g/dL — ABNORMAL LOW (ref 13.0–17.0)
Immature Granulocytes: 1 %
Lymphocytes Relative: 16 %
Lymphs Abs: 2.4 10*3/uL (ref 0.7–4.0)
MCH: 28.2 pg (ref 26.0–34.0)
MCHC: 30 g/dL (ref 30.0–36.0)
MCV: 93.9 fL (ref 80.0–100.0)
Monocytes Absolute: 0.9 10*3/uL (ref 0.1–1.0)
Monocytes Relative: 6 %
Neutro Abs: 7.7 10*3/uL (ref 1.7–7.7)
Neutrophils Relative %: 51 %
Platelets: 325 10*3/uL (ref 150–400)
RBC: 3.12 MIL/uL — ABNORMAL LOW (ref 4.22–5.81)
RDW: 15.2 % (ref 11.5–15.5)
WBC: 15.2 10*3/uL — ABNORMAL HIGH (ref 4.0–10.5)
nRBC: 0 % (ref 0.0–0.2)

## 2019-12-21 LAB — BASIC METABOLIC PANEL
Anion gap: 8 (ref 5–15)
BUN: 45 mg/dL — ABNORMAL HIGH (ref 8–23)
CO2: 30 mmol/L (ref 22–32)
Calcium: 8.5 mg/dL — ABNORMAL LOW (ref 8.9–10.3)
Chloride: 106 mmol/L (ref 98–111)
Creatinine, Ser: 0.38 mg/dL — ABNORMAL LOW (ref 0.61–1.24)
GFR calc Af Amer: >60 mL/min (ref >60)
GFR calc non Af Amer: >60 mL/min (ref >60)
Glucose, Bld: 184 mg/dL — ABNORMAL HIGH (ref 70–99)
Potassium: 3.6 mmol/L (ref 3.5–5.1)
Sodium: 144 mmol/L (ref 135–145)

## 2019-12-21 LAB — MAGNESIUM: Magnesium: 2 mg/dL (ref 1.7–2.4)

## 2019-12-21 LAB — GLUCOSE, CAPILLARY
Glucose-Capillary: 175 mg/dL — ABNORMAL HIGH (ref 70–99)
Glucose-Capillary: 171 mg/dL — ABNORMAL HIGH (ref 70–99)
Glucose-Capillary: 124 mg/dL — ABNORMAL HIGH (ref 70–99)
Glucose-Capillary: 142 mg/dL — ABNORMAL HIGH (ref 70–99)
Glucose-Capillary: 144 mg/dL — ABNORMAL HIGH (ref 70–99)

## 2019-12-21 MED ORDER — SODIUM CHLORIDE 0.9 % IV SOLN
1.0000 g | Freq: Three times a day (TID) | INTRAVENOUS | Status: DC
  Administered 2019-12-21 – 2019-12-22 (×2): 1 g via INTRAVENOUS
  Filled 2019-12-21 (×4): qty 1

## 2019-12-21 MED ORDER — VANCOMYCIN HCL IN DEXTROSE 1-5 GM/200ML-% IV SOLN
1000.0000 mg | Freq: Two times a day (BID) | INTRAVENOUS | Status: DC
  Filled 2019-12-21: qty 200

## 2019-12-21 MED ORDER — VANCOMYCIN HCL 1500 MG/300ML IV SOLN
1500.0000 mg | Freq: Once | INTRAVENOUS | Status: AC
  Administered 2019-12-21: 1500 mg via INTRAVENOUS
  Filled 2019-12-21: qty 300

## 2019-12-21 NOTE — Progress Notes (Signed)
Physical Therapy Wound Treatment Patient Details  Name: [name] MRN: [id number] Date of Birth: 08/08/1955  Today's Date: 12/21/2019 Time:  -     Subjective  Subjective: Pt family for visit at end of session  Patient and Family Stated Goals: intubated and sedated Date of Onset: (unknown) Prior Treatments: moist gauze and santyl   Pain Score:  Pt on sedation, no evidence of increased pain with treatment  Wound Assessment  Pressure Injury 12/12/19 Coccyx Mid Unstageable - Full thickness tissue loss in which the base of the injury is covered by slough (yellow, tan, gray, green or brown) and/or eschar (tan, brown or black) in the wound bed. (Active)  Wound Image   12/17/19 1400  Dressing Type ABD;Moist to moist;Other (Comment);Barrier Film (skin prep) 12/21/19 1400  Dressing Changed 12/21/19 1400  Dressing Change Frequency Daily 12/21/19 1400  State of Healing Eschar 12/21/19 1400  Site / Wound Assessment Black;Red;Yellow;Brown 12/21/19 1400  % Wound base Red or Granulating 15% 12/21/19 1400  % Wound base Yellow/Fibrinous Exudate 85% 12/21/19 1400  % Wound base Black/Eschar 90% 12/16/19 1000  Peri-wound Assessment Erythema (blanchable) 12/21/19 1400  Wound Length (cm) 8.5 cm 12/17/19 1400  Wound Width (cm) 7.5 cm 12/17/19 1400  Wound Surface Area (cm^2) 63.75 cm^2 12/17/19 1400  Margins Unattached edges (unapproximated) 12/21/19 1400  Drainage Amount Moderate 12/21/19 1400  Drainage Description Serosanguineous;Green;Odor 12/21/19 1400  Treatment Debridement (Selective);Hydrotherapy (Pulse lavage);Packing (Saline gauze) 12/21/19 1400  Santyl applied to necrotic tissue  Hydrotherapy Pulsed lavage therapy - wound location: sacral wound Pulsed Lavage with Suction (psi): 12 psi Pulsed Lavage with Suction - Normal Saline Used: 1000 mL Pulsed Lavage Tip: Tip with splash shield Selective Debridement Selective Debridement - Location: sacral wound Selective Debridement - Tools Used:  Forceps;Scalpel;Scissors Selective Debridement - Tissue Removed: eschar softened by santyl and pulse lavage   Wound Assessment and Plan  Wound Therapy - Assess/Plan/Recommendations Wound Therapy - Clinical Statement: Wound culture obtained prior to administering Dakin's solution treatment. Pt eschar continues to be hardened and closely adhered. Pt will benefit from continued pulse lavage and debridement to loosen eschar for removal to decrease bioburden and aid in wound healing Wound Therapy - Functional Problem List: sedated, and immobile Factors Delaying/Impairing Wound Healing: Immobility;Incontinence;Multiple medical problems Hydrotherapy Plan: Debridement;Pulsatile lavage with suction;Patient/family education Wound Therapy - Frequency: 6X / week Wound Therapy - Follow Up Recommendations: Skilled nursing facility Wound Plan: see above  Wound Therapy Goals- Improve the function of patient's integumentary system by progressing the wound(s) through the phases of wound healing (inflammation - proliferation - remodeling) by: Decrease Necrotic Tissue to: 5 Decrease Necrotic Tissue - Progress: Progressing toward goal Increase Granulation Tissue to: 95 Increase Granulation Tissue - Progress: Progressing toward goal Decrease Length/Width/Depth by (cm): 10% Decrease Length/Width/Depth - Progress: Progressing toward goal Goals/treatment plan/discharge plan were made with and agreed upon by patient/family: No, Patient unable to participate in goals/treatment/discharge plan and family unavailable Time For Goal Achievement: 7 days Wound Therapy - Potential for Goals: Good  Goals will be updated until maximal potential achieved or discharge criteria met.  Discharge criteria: when goals achieved, discharge from hospital, MD decision/surgical intervention, no progress towards goals, refusal/missing three consecutive treatments without notification or medical reason.  GP     B. Van Fleet PT,  DPT Acute Rehabilitation Services Pager [phone] Office [phone]   B Van Fleet 12/21/2019, 2:40 PM   

## 2019-12-21 NOTE — Progress Notes (Signed)
eLink Physician-Brief Progress Note Patient Name: [name] DOB: [birth year] MRN: [id number]   Date of Service  12/21/2019  HPI/Events of Note  Fever to 46 F - Not currently on Abx. Already has order for Tylenol. Patient is allergic to penicillin.   eICU Interventions  Will order: 1. Blood cultures X 2 now.  2. Tracheal aspirate culture.  3. UA with reflex microscopic now.  4. Vancomycin and Meropenum per pharmacy consult.      Intervention Category Major Interventions: Infection - evaluation and management  Sommer,Steven Eugene 12/21/2019, 9:17 PM

## 2019-12-21 NOTE — Progress Notes (Signed)
Pharmacy Antibiotic Note  [name] is a 64 y.o. male admitted on 11/14/2019 with shortness of breath.  WBC elevated 15 and Tm elevated 103, Cr 0.4 Crcl 15ml/min.  Plan for broad spectrum ABX. Pharmacy has been consulted for vancomycin and merroepem dosing.  Plan: Vancomycin 1500mg  IV x 1 then 1000mg  IV Q12H Meropenem 1gm IV q8h F/u renal fxn, C&S, clinical status and peak/trough at SS  Height: 5\' 8"  (172.7 cm) Weight: 78.1 kg (172 lb 2.9 oz) IBW/kg (Calculated) : 68.4  Temp (24hrs), Avg:99.5 F (37.5 C), Min:97.2 F (36.2 C), Max:103 F (39.4 C)  Recent Labs  Lab 12/15/19 0421 12/16/19 1030 12/17/19 0415 12/18/19 0459 12/19/19 0438 12/20/19 0424 12/21/19 0449  WBC 11.7*  --  14.7* 11.9*  --  16.2* 15.2*  CREATININE 0.49*   < > 0.51* 0.54* 0.45* 0.51* 0.38*   < > = values in this interval not displayed.    Estimated Creatinine Clearance: 89.1 mL/min (A) (by C-G formula based on SCr of 0.38 mg/dL (L)).    Allergies  Allergen Reactions  . Penicillins Other (See Comments)    "Made me pass out" Test dose of Ancef , vo Dr 12/21/19, without reports of urticaria, no redness, no chang in vs.07-25-14 [facility] CRNA   Did it involve swelling of the face/tongue/throat, SOB, or low BP? unk Did it involve sudden or severe rash/hives, skin peeling, or any reaction on the inside of your mouth or nose?  unk Did you need to seek medical attention at a hospital or doctor's office? Yes When did it last happen?was a child If all above answers are "NO", [place] proceed with cephalosporin   . Niacin And Related Other (See Comments)    Reaction unknown    Antimicrobials this admission:  Vanc 4/21>>4/23 Zosyn 4/21>>4/27 Azithro 4/3 >> 4/5 CTX 4/2 >> 4/7  Remdesivir 4/2 >> 4/6 Actemra 4/2 x 1  Dose adjustments this admission:  Microbiology results: 4/2 BCx - negative 4/2 covid - positive 4/5 TA - negative 4/12 TA - negative 4/21 TA - negative 4/22 MRSA PCR - negative 5/14 BCx -  ngtd 5/14 TA - rare GPR   5/22 Pharm.D. CPP, BCPS Clinical Pharmacist [phone] 12/21/2019 9:37 PM

## 2019-12-21 NOTE — Progress Notes (Signed)
Elink notified of patients temp 103.1. Tylenol given. Will pan culture patient and give antibiotics.

## 2019-12-21 NOTE — Progress Notes (Signed)
NAME:  [name], MRN:  [id number], DOB:  07-29-1956, LOS: 43 ADMISSION DATE:  12/01/2019, CONSULTATION DATE:  4/5 REFERRING MD:  Jerral Lenzie, CHIEF COMPLAINT:  Dyspnea   Brief History   64 year old man admitted 4/2 with COVID pneumonia and delirium after 1 week of symptoms.  Intubated on 4/5, required prolonged mechanical ventilatory support.  Tracheostomy performed on 4/20  Past Medical History    Allergic rhinitis   . Arthritis of left acromioclavicular joint 07/25/2014  . Asthma   . Chronic bronchitis (HCC)   . Chronic kidney disease    H/O STONES  . Colon polyp   . Coronary artery disease   . Diabetes mellitus without complication (HCC)   . Diverticula of colon   . GERD (gastroesophageal reflux disease)   . Hx of heart bypass surgery 2005  . Hypercholesteremia   . Impingement syndrome of left shoulder 07/25/2014  . Laceration of brachial artery 03/11/2016   left arm  . Lumbar disc disease   . Partial nontraumatic rupture of right rotator cuff 10/20/2016    Significant Hospital Events   4/19: tachypneic overnight and this am. Unable to wean sedation. Tracheostomy done  4/20: successful trach yesterday but de-recruited and on some escalated settings today. Remains tachypneic this am. Hypoglycemic yesterday and lantus changed 4/22: started on empiric abx but resp cx with abundant gpc. Improving oxygenation requirement but remains with rass -4 despite light sedation.  4/23:mucous plugging overnight and this am with copious/thick secretions.  4/28: issues overnight with increased vent settings. Still without purposeful responses. Fever overnight as well to 101.1. remains minimally responsive and unable to wean iv sedation except for short periods, despite increase in po agents.  4/29: somewhat improved settings overnight on 60/8. Still with temps overnight to 100.7 5/1: Continued improvement in vent requirement 50/8, remains on several sedating medications including  continuous fentanyl and precedex. Trach changed to 6.0 shiley XLT. 5/7 tolerating pressure support ventilation while on sedation  Consults:    Procedures:  04/05 Rt CVC > 4/24 04/16 Bronchoscopy  04/20Tracheostomy 4/24 Rt PICC  Significant Diagnostic Tests:  4/22 CXR: 1. Lines and tubes stable position. 2. Persistent bibasilar pulmonary infiltrates/edema with interim slight clearing from prior exam. 3. Prior CABG. Heart size stable.  4/24 CXR: Slight increase in interstitial and airspace opacities particularly in the lower lobes. Findings suspicious for worsening asymmetric edema or infection.  Head CT 4/27 Mild chronic ischemic white matter disease. No acute intracranial abnormality seen.  Abdominal CT 4/27 > for PEG, trace pneumoperitoneum, extensive airspace disease due to COVID 19, kidney stone left, non-obstructing  MRI brain 5/3 > NAICP, chronic small vessel ischemia, chronic microhemorrhages, stable mild generalized atrophy, ethmoid sinus thickening, bilateral mastoid effusions  Chest CT 5/11 > 1. Pulmonary fibrosis with UIP pattern, new from 2015. 2. There is lower lobe atelectasis. No edema or convincing pneumonia.  Micro Data:  COVID 4/2 >Positive  Blood cultures 4/2 > negative  Respiratory culture 4/5 > Negative  Respiratory culture 4/12 > Normal flora  Respiratory culture 4/21 > Normal flora   Antimicrobials:  4/21 Vancomycin > 4/23 4/21 Zosyn > 4/26  Interim history/subjective:  Weaned all day 5/13 on sedation For PEG placement 5/14 afternoon. NPO since midnight + 8 L T max 101 last 24 WBC 16.2 CXR 5/14 Chronic interstitial lung disease without definite superimposed acute cardiopulmonary process. No change from prior study  Objective   Blood pressure (!) 102/54, pulse 63, temperature (!) 97.2 F (36.2 C),  temperature source Axillary, resp. rate (!) 24, height 5\' 8"  (1.727 m), weight 78.1 kg, SpO2 99 %.    Vent Mode: CPAP;PSV FiO2  (%):  [50 %] 50 % Set Rate:  [20 bmp] 20 bmp Vt Set:  [480 mL] 480 mL PEEP:  [5 cmH20] 5 cmH20 Pressure Support:  [10 cmH20] 10 cmH20 Plateau Pressure:  [22 cmH20-33 cmH20] 33 cmH20   Intake/Output Summary (Last 24 hours) at 12/21/2019 1657 Last data filed at 12/21/2019 1600 Gross per 24 hour  Intake 3577.75 ml  Output 3075 ml  Net 502.75 ml   Filed Weights   12/19/19 0436 12/20/19 0222 12/21/19 0259  Weight: 82 kg 78.5 kg 78.1 kg    Examination: General: Frail elderly male, sedated and trached on mechanical ventilation, NAD HEENT: Tracheostomy secure and intact , good placement, Pink stoma Neuro: Sedated and trached, follows commands at intervals per nursing CV: S1, S2, RRR, No RMG PULM: Bilateral chest excursion, crackles at both bases.  Tolerating pressure support 10/5. GI: soft, bsx4 active  GU: Extremities: warm/dry,  edema  Skin: no rashes or lesions   Resolved Hospital Problem list   COVID-19 pneumonia -Has completed full course of remdesivir and dexamethasone, off isolation 4/23.  HCAP Pneumomediastinum/pneumopericardium  Assessment & Plan:   Remains critically ill due to acute respiratory failure with hypoxemia due to COVID 19 requiring mechanical ventilation Severe respiratory muscle weakness.  Underlying asthma.  Requiring sedation for dyssynchrony -Keep on pressure support.  Tolerates this better than PRVC with less distress. -Minimize sedation.   Post COVID pulmonary fibrosis  -Pulmonary fibrosis with UIP pattern on chest CT 5/11  Toxic metabolic encephalopathy related to prolonged sedation. -Continue enteral agents -Wean Precedex. -Increase fentanyl patch to match IV rate and discontinue IV fentanyl tomorrow  New Fever ( 101.1 with increase in WBC 5/14 to 16.2) resolving spontaneously CXR no change, cultures negative -Follow for now.  Positive Cumulative Fluid Balance  -Continue diuresis.  Urinary retention -External catheter in place   Goals  of care:  Needs LTAC placement   Best practice:  Diet: continue tube feeding Pain/Anxiety/Delirium protocol (if indicated): as above VAP protocol (if indicated): yes DVT prophylaxis: lovenox 40mg  daily GI prophylaxis: PPI Glucose control: Lantus+ SSI Mobility: needs mobility attempts Code Status: full Family Communication: Attempted update 5/15 - No answer. Disposition: remain in ICU  Labs   CBC: Recent Labs  Lab 12/15/19 0421 12/17/19 0003 12/17/19 0415 12/18/19 0459 12/18/19 1232 12/20/19 0424 12/21/19 0449  WBC 11.7*  --  14.7* 11.9*  --  16.2* 15.2*  NEUTROABS  --   --   --  5.4  --   --  7.7  HGB 8.6*   < > 9.1* 8.7* 9.9* 9.4* 8.8*  HCT 28.4*   < > 29.7* 28.3* 29.0* 30.5* 29.3*  MCV 93.1  --  93.7 93.1  --  93.0 93.9  PLT 337  --  395 344  --  362 325   < > = values in this interval not displayed.    Basic Metabolic Panel: Recent Labs  Lab 12/15/19 0421 12/16/19 1030 12/17/19 0415 12/17/19 0415 12/18/19 0459 12/18/19 0459 12/18/19 1232 12/18/19 1722 12/19/19 0438 12/20/19 0424 12/21/19 0449  NA 139   < > 139   < > 140  --  140  --  141 143 144  K 3.6   < > 3.6   < > 2.9*   < > 4.2 3.9 3.5 3.5 3.6  CL 89*   < >  91*  --  96*  --   --   --  99 100 106  CO2 39*   < > 37*  --  34*  --   --   --  33* 36* 30  GLUCOSE 221*   < > 209*  --  154*  --   --   --  115* 116* 184*  BUN 22   < > 34*  --  38*  --   --   --  39* 52* 45*  CREATININE 0.49*   < > 0.51*  --  0.54*  --   --   --  0.45* 0.51* 0.38*  CALCIUM 8.8*   < > 8.9  --  8.6*  --   --   --  8.6* 8.7* 8.5*  MG 2.1  --   --   --   --   --   --   --   --  2.0 2.0  PHOS  --   --   --   --   --   --   --   --   --  5.1*  --    < > = values in this interval not displayed.   GFR: Estimated Creatinine Clearance: 89.1 mL/min (A) (by C-G formula based on SCr of 0.38 mg/dL (L)). Recent Labs  Lab 12/17/19 0415 12/18/19 0459 12/20/19 0424 12/21/19 0449  WBC 14.7* 11.9* 16.2* 15.2*    Liver Function  Tests: No results for input(s): AST, ALT, ALKPHOS, BILITOT, PROT, ALBUMIN in the last 168 hours. No results for input(s): LIPASE, AMYLASE in the last 168 hours. No results for input(s): AMMONIA in the last 168 hours.  ABG    Component Value Date/Time   PHART 7.449 12/18/2019 1232   PCO2ART 52.5 (H) 12/18/2019 1232   PO2ART 68 (L) 12/18/2019 1232   HCO3 36.4 (H) 12/18/2019 1232   TCO2 38 (H) 12/18/2019 1232   ACIDBASEDEF 1.0 11/11/2019 1529   O2SAT 94.0 12/18/2019 1232     Coagulation Profile: Recent Labs  Lab 12/19/19 0438  INR 1.1    Cardiac Enzymes: No results for input(s): CKTOTAL, CKMB, CKMBINDEX, TROPONINI in the last 168 hours.  HbA1C: Hgb A1c MFr Bld  Date/Time Value Ref Range Status  11/09/2019 06:51 PM 7.5 (H) 4.8 - 5.6 % Final    Comment:    (NOTE) Pre diabetes:          5.7%-6.4% Diabetes:              >6.4% Glycemic control for   <7.0% adults with diabetes   12/19/2018 02:21 PM 7.7 (H) 4.6 - 6.5 % Final    Comment:    Glycemic Control Guidelines for People with Diabetes:Non Diabetic:  <6%Goal of Therapy: <7%Additional Action Suggested:  >8%     CBG: Recent Labs  Lab 12/20/19 2349 12/21/19 0405 12/21/19 0811 12/21/19 1206 12/21/19 1559  GLUCAP 138* 175* 171* 124* 142*    peer to peer with [facility] MD regarding LTAC placement. Despite the fact the patient is weaning , and mental status has improved, they have denied him placement due to fever of 101.1 with increased WBC within the last 24 hours. Per the MD, Medicare rules are clear, and he cannot have any ongoing acute medical issues when being assessed for  admission to LTAC. I have told them we will call for reassessment once fever and WBC have returned to within normal parameters, as he is very appropriate for LTAC placement.  Kipp Brood, MD [facility] ICU Physician [place]  Pager: [phone] Mobile: [phone] After hours: [phone].  12/21/2019, 5:03 PM

## 2019-12-22 LAB — URINALYSIS, ROUTINE W REFLEX MICROSCOPIC
Bilirubin Urine: NEGATIVE
Glucose, UA: NEGATIVE mg/dL
Hgb urine dipstick: NEGATIVE
Ketones, ur: NEGATIVE mg/dL
Nitrite: NEGATIVE
Protein, ur: NEGATIVE mg/dL
Specific Gravity, Urine: 1.02 (ref 1.005–1.030)
WBC, UA: >50 WBC/hpf — ABNORMAL HIGH (ref 0–5)
pH: 5 (ref 5.0–8.0)

## 2019-12-22 LAB — CULTURE, RESPIRATORY W GRAM STAIN
Culture: NORMAL
Special Requests: NORMAL

## 2019-12-22 LAB — GLUCOSE, CAPILLARY
Glucose-Capillary: 141 mg/dL — ABNORMAL HIGH (ref 70–99)
Glucose-Capillary: 144 mg/dL — ABNORMAL HIGH (ref 70–99)
Glucose-Capillary: 153 mg/dL — ABNORMAL HIGH (ref 70–99)
Glucose-Capillary: 184 mg/dL — ABNORMAL HIGH (ref 70–99)
Glucose-Capillary: 185 mg/dL — ABNORMAL HIGH (ref 70–99)
Glucose-Capillary: 194 mg/dL — ABNORMAL HIGH (ref 70–99)
Glucose-Capillary: 202 mg/dL — ABNORMAL HIGH (ref 70–99)

## 2019-12-22 MED ORDER — FUROSEMIDE 40 MG PO TABS
40.0000 mg | ORAL_TABLET | Freq: Every day | ORAL | Status: DC
  Administered 2019-12-22 – 2019-12-25 (×3): 40 mg
  Filled 2019-12-22 (×2): qty 1

## 2019-12-22 MED ORDER — CHLORHEXIDINE GLUCONATE 0.12 % MT SOLN
OROMUCOSAL | Status: AC
  Filled 2019-12-22: qty 15

## 2019-12-22 MED ORDER — CLONIDINE HCL 0.2 MG PO TABS: 0.1000 mg | ORAL_TABLET | Freq: Every day | ORAL | Status: DC

## 2019-12-22 MED ORDER — CLONIDINE HCL 0.2 MG PO TABS: 0.1000 mg | ORAL_TABLET | Freq: Two times a day (BID) | ORAL | Status: DC

## 2019-12-22 MED ORDER — CLONIDINE HCL 0.2 MG PO TABS
0.1000 mg | ORAL_TABLET | Freq: Four times a day (QID) | ORAL | Status: DC
  Administered 2019-12-23 (×2): 0.1 mg via ORAL
  Filled 2019-12-22 (×3): qty 1

## 2019-12-22 MED ORDER — CLONIDINE HCL 0.2 MG PO TABS
0.2000 mg | ORAL_TABLET | Freq: Four times a day (QID) | ORAL | Status: AC
  Administered 2019-12-22 – 2019-12-23 (×4): 0.2 mg via ORAL
  Filled 2019-12-22 (×4): qty 1

## 2019-12-22 NOTE — Progress Notes (Signed)
NAME:  [name], MRN:  [id number], DOB:  [birth year], LOS: 44 ADMISSION DATE:  11/10/2019, CONSULTATION DATE:  4/5 REFERRING MD:  Jerral Tyson, CHIEF COMPLAINT:  Dyspnea   Brief History   64 year old man admitted 4/2 with COVID pneumonia and delirium after 1 week of symptoms.  Intubated on 4/5, required prolonged mechanical ventilatory support.  Tracheostomy performed on 4/20  Past Medical History    Allergic rhinitis   . Arthritis of left acromioclavicular joint 07/25/2014  . Asthma   . Chronic bronchitis (HCC)   . Chronic kidney disease    H/O STONES  . Colon polyp   . Coronary artery disease   . Diabetes mellitus without complication (HCC)   . Diverticula of colon   . GERD (gastroesophageal reflux disease)   . Hx of heart bypass surgery 2005  . Hypercholesteremia   . Impingement syndrome of left shoulder 07/25/2014  . Laceration of brachial artery 03/11/2016   left arm  . Lumbar disc disease   . Partial nontraumatic rupture of right rotator cuff 10/20/2016    Significant Hospital Events   4/19: tachypneic overnight and this am. Unable to wean sedation. Tracheostomy done  4/20: successful trach yesterday but de-recruited and on some escalated settings today. Remains tachypneic this am. Hypoglycemic yesterday and lantus changed 4/22: started on empiric abx but resp cx with abundant gpc. Improving oxygenation requirement but remains with rass -4 despite light sedation.  4/23:mucous plugging overnight and this am with copious/thick secretions.  4/28: issues overnight with increased vent settings. Still without purposeful responses. Fever overnight as well to 101.1. remains minimally responsive and unable to wean iv sedation except for short periods, despite increase in po agents.  4/29: somewhat improved settings overnight on 60/8. Still with temps overnight to 100.7 5/1: Continued improvement in vent requirement 50/8, remains on several sedating medications including  continuous fentanyl and precedex. Trach changed to 6.0 shiley XLT. 5/7 tolerating pressure support ventilation while on sedation 5/16 off sedative infusions and on to trach collar.   Consults:    Procedures:  04/05 Rt CVC > 4/24 04/16 Bronchoscopy  04/20Tracheostomy 4/24 Rt PICC  Significant Diagnostic Tests:  4/22 CXR: 1. Lines and tubes stable position. 2. Persistent bibasilar pulmonary infiltrates/edema with interim slight clearing from prior exam. 3. Prior CABG. Heart size stable.  4/24 CXR: Slight increase in interstitial and airspace opacities particularly in the lower lobes. Findings suspicious for worsening asymmetric edema or infection.  Head CT 4/27 Mild chronic ischemic white matter disease. No acute intracranial abnormality seen.  Abdominal CT 4/27 > for PEG, trace pneumoperitoneum, extensive airspace disease due to COVID 19, kidney stone left, non-obstructing  MRI brain 5/3 > NAICP, chronic small vessel ischemia, chronic microhemorrhages, stable mild generalized atrophy, ethmoid sinus thickening, bilateral mastoid effusions  Chest CT 5/11 > 1. Pulmonary fibrosis with UIP pattern, new from 2015. 2. There is lower lobe atelectasis. No edema or convincing pneumonia.  Micro Data:  COVID 4/2 >Positive  Blood cultures 4/2 > negative  Respiratory culture 4/5 > Negative  Respiratory culture 4/12 > Normal flora  Respiratory culture 4/21 > Normal flora   Antimicrobials:  4/21 Vancomycin > 4/23 4/21 Zosyn > 4/26  Interim history/subjective:  Tolerates periods of weaning but becomes tachypneic when agitated.  Rapidly redirectable.   Objective   Blood pressure 133/61, pulse (!) 128, temperature 99.4 F (37.4 C), temperature source Axillary, resp. rate (!) 30, height 5\' 8"  (1.727 m), weight 74.7 kg, SpO2 95 %.  Vent Mode: Stand-by FiO2 (%):  [35 %-50 %] 35 % Set Rate:  [20 bmp] 20 bmp Vt Set:  [480 mL] 480 mL PEEP:  [5 cmH20] 5 cmH20 Pressure  Support:  [10 cmH20] 10 cmH20 Plateau Pressure:  [15 cmH20-20 cmH20] 15 cmH20   Intake/Output Summary (Last 24 hours) at 12/22/2019 5102 Last data filed at 12/22/2019 0900 Gross per 24 hour  Intake 3260.38 ml  Output 1800 ml  Net 1460.38 ml   Filed Weights   12/20/19 0222 12/21/19 0259 12/22/19 0500  Weight: 78.5 kg 78.1 kg 74.7 kg    Examination: General: Frail elderly male, cachectic.  Awake. HEENT: Tracheostomy secure and intact , good placement, Pink stoma Neuro: Awake and follows commands.  Attempts to mouth words incomprehensibly. CV: S1, S2, RRR, No RMG PULM: Chest clear with good chest expansion.  Periods of tachypnea up to 60 which slows down with coaching.  Comfortable on trach collar. GI: soft, bsx4 active  GU: External catheter. Extremities: warm/dry,  edema  Skin: no rashes or lesions   Resolved Hospital Problem list   COVID-19 pneumonia -Has completed full course of remdesivir and dexamethasone, off isolation 4/23.  HCAP Pneumomediastinum/pneumopericardium  Assessment & Plan:   Remains critically ill due to acute respiratory failure with hypoxemia due to COVID 19 requiring mechanical ventilation Severe respiratory muscle weakness.  Underlying asthma.  Requiring sedation for dyssynchrony.  Patient will slow down his breathing with coaching.  This is an anxiety or behavioral issue rather than underlying lung disease.  He Bogusz be more comfortable off of mechanical ventilation. -Open-ended  trach collar trial.   Post COVID pulmonary fibrosis  -Pulmonary fibrosis with UIP pattern on chest CT 5/11  Toxic metabolic encephalopathy related to prolonged sedation. -Continue enteral agents -Wean off fentanyl infusion.  Single fever spike with negative cultures and weaning ventilation.  No signs of sepsis.  Did have wound care yesterday. -Follow for now. -Stop empiric antibiotics.  Positive Cumulative Fluid Balance  -Continue diuresis.  Urinary retention -External  catheter in place   Goals of care:  Needs LTAC placement   Best practice:  Diet: continue tube feeding Pain/Anxiety/Delirium protocol (if indicated): as above VAP protocol (if indicated): yes DVT prophylaxis: lovenox 40mg  daily GI prophylaxis: PPI Glucose control: Lantus+ SSI Mobility: needs mobility attempts Code Status: full Family Communication: Wife updated 5/15. Disposition: remain in ICU  Labs   CBC: Recent Labs  Lab 12/17/19 0415 12/18/19 0459 12/18/19 1232 12/20/19 0424 12/21/19 0449  WBC 14.7* 11.9*  --  16.2* 15.2*  NEUTROABS  --  5.4  --   --  7.7  HGB 9.1* 8.7* 9.9* 9.4* 8.8*  HCT 29.7* 28.3* 29.0* 30.5* 29.3*  MCV 93.7 93.1  --  93.0 93.9  PLT 395 344  --  362 325    Basic Metabolic Panel: Recent Labs  Lab 12/17/19 0415 12/17/19 0415 12/18/19 0459 12/18/19 0459 12/18/19 1232 12/18/19 1722 12/19/19 0438 12/20/19 0424 12/21/19 0449  NA 139   < > 140  --  140  --  141 143 144  K 3.6   < > 2.9*   < > 4.2 3.9 3.5 3.5 3.6  CL 91*  --  96*  --   --   --  99 100 106  CO2 37*  --  34*  --   --   --  33* 36* 30  GLUCOSE 209*  --  154*  --   --   --  115* 116* 184*  BUN 34*  --  38*  --   --   --  39* 52* 45*  CREATININE 0.51*  --  0.54*  --   --   --  0.45* 0.51* 0.38*  CALCIUM 8.9  --  8.6*  --   --   --  8.6* 8.7* 8.5*  MG  --   --   --   --   --   --   --  2.0 2.0  PHOS  --   --   --   --   --   --   --  5.1*  --    < > = values in this interval not displayed.   GFR: Estimated Creatinine Clearance: 89.1 mL/min (A) (by C-G formula based on SCr of 0.38 mg/dL (L)). Recent Labs  Lab 12/17/19 0415 12/18/19 0459 12/20/19 0424 12/21/19 0449  WBC 14.7* 11.9* 16.2* 15.2*    Liver Function Tests: No results for input(s): AST, ALT, ALKPHOS, BILITOT, PROT, ALBUMIN in the last 168 hours. No results for input(s): LIPASE, AMYLASE in the last 168 hours. No results for input(s): AMMONIA in the last 168 hours.  ABG    Component Value Date/Time   PHART  7.449 12/18/2019 1232   PCO2ART 52.5 (H) 12/18/2019 1232   PO2ART 68 (L) 12/18/2019 1232   HCO3 36.4 (H) 12/18/2019 1232   TCO2 38 (H) 12/18/2019 1232   ACIDBASEDEF 1.0 11/11/2019 1529   O2SAT 94.0 12/18/2019 1232     Coagulation Profile: Recent Labs  Lab 12/19/19 0438  INR 1.1    Cardiac Enzymes: No results for input(s): CKTOTAL, CKMB, CKMBINDEX, TROPONINI in the last 168 hours.  HbA1C: Hgb A1c MFr Bld  Date/Time Value Ref Range Status  11/24/2019 06:51 PM 7.5 (H) 4.8 - 5.6 % Final    Comment:    (NOTE) Pre diabetes:          5.7%-6.4% Diabetes:              >6.4% Glycemic control for   <7.0% adults with diabetes   12/19/2018 02:21 PM 7.7 (H) 4.6 - 6.5 % Final    Comment:    Glycemic Control Guidelines for People with Diabetes:Non Diabetic:  <6%Goal of Therapy: <7%Additional Action Suggested:  >8%     CBG: Recent Labs  Lab 12/21/19 1559 12/21/19 2020 12/22/19 0008 12/22/19 0418 12/22/19 0759  GLUCAP 142* 144* 153* 144* 185*    peer to peer with [facility] MD regarding LTAC placement. Despite the fact the patient is weaning , and mental status has improved, they have denied him placement due to fever of 101.1 with increased WBC within the last 24 hours. Per the MD, Medicare rules are clear, and he cannot have any ongoing acute medical issues when being assessed for  admission to LTAC. I have told them we will call for reassessment once fever and WBC have returned to within normal parameters, as he is very appropriate for LTAC placement.   Kipp Brood, MD [facility] ICU Physician [place]  Pager: [phone] Mobile: [phone] After hours: [phone].  12/22/2019, 9:21 AM

## 2019-12-23 LAB — BASIC METABOLIC PANEL
Anion gap: 11 (ref 5–15)
BUN: 30 mg/dL — ABNORMAL HIGH (ref 8–23)
CO2: 31 mmol/L (ref 22–32)
Calcium: 8.7 mg/dL — ABNORMAL LOW (ref 8.9–10.3)
Chloride: 102 mmol/L (ref 98–111)
Creatinine, Ser: 0.38 mg/dL — ABNORMAL LOW (ref 0.61–1.24)
GFR calc Af Amer: >60 mL/min (ref >60)
GFR calc non Af Amer: >60 mL/min (ref >60)
Glucose, Bld: 161 mg/dL — ABNORMAL HIGH (ref 70–99)
Potassium: 3.3 mmol/L — ABNORMAL LOW (ref 3.5–5.1)
Sodium: 144 mmol/L (ref 135–145)

## 2019-12-23 LAB — GLUCOSE, CAPILLARY
Glucose-Capillary: 104 mg/dL — ABNORMAL HIGH (ref 70–99)
Glucose-Capillary: 120 mg/dL — ABNORMAL HIGH (ref 70–99)
Glucose-Capillary: 137 mg/dL — ABNORMAL HIGH (ref 70–99)
Glucose-Capillary: 143 mg/dL — ABNORMAL HIGH (ref 70–99)
Glucose-Capillary: 147 mg/dL — ABNORMAL HIGH (ref 70–99)
Glucose-Capillary: 163 mg/dL — ABNORMAL HIGH (ref 70–99)

## 2019-12-23 LAB — CBC WITH DIFFERENTIAL/PLATELET
Abs Immature Granulocytes: 0.12 10*3/uL — ABNORMAL HIGH (ref 0.00–0.07)
Basophils Absolute: 0.1 10*3/uL (ref 0.0–0.1)
Basophils Relative: 1 %
Eosinophils Absolute: 3.6 10*3/uL — ABNORMAL HIGH (ref 0.0–0.5)
Eosinophils Relative: 20 %
HCT: 29.3 % — ABNORMAL LOW (ref 39.0–52.0)
Hemoglobin: 8.7 g/dL — ABNORMAL LOW (ref 13.0–17.0)
Immature Granulocytes: 1 %
Lymphocytes Relative: 11 %
Lymphs Abs: 1.9 10*3/uL (ref 0.7–4.0)
MCH: 28.1 pg (ref 26.0–34.0)
MCHC: 29.7 g/dL — ABNORMAL LOW (ref 30.0–36.0)
MCV: 94.5 fL (ref 80.0–100.0)
Monocytes Absolute: 1.1 10*3/uL — ABNORMAL HIGH (ref 0.1–1.0)
Monocytes Relative: 6 %
Neutro Abs: 11 10*3/uL — ABNORMAL HIGH (ref 1.7–7.7)
Neutrophils Relative %: 61 %
Platelets: 275 10*3/uL (ref 150–400)
RBC: 3.1 MIL/uL — ABNORMAL LOW (ref 4.22–5.81)
RDW: 15.4 % (ref 11.5–15.5)
WBC: 17.8 10*3/uL — ABNORMAL HIGH (ref 4.0–10.5)
nRBC: 0 % (ref 0.0–0.2)

## 2019-12-23 MED ORDER — SODIUM CHLORIDE 0.9 % IV SOLN
3.0000 g | Freq: Three times a day (TID) | INTRAVENOUS | Status: DC
  Administered 2019-12-23: 3 g via INTRAVENOUS
  Filled 2019-12-23: qty 8

## 2019-12-23 MED ORDER — POTASSIUM CHLORIDE 20 MEQ/15ML (10%) PO SOLN
20.0000 meq | ORAL | Status: AC
  Administered 2019-12-23 (×2): 20 meq
  Filled 2019-12-23 (×2): qty 15

## 2019-12-23 MED ORDER — CLONIDINE HCL 0.2 MG PO TABS: 0.1000 mg | ORAL_TABLET | Freq: Every day | ORAL | Status: DC

## 2019-12-23 MED ORDER — BUSPIRONE HCL 10 MG PO TABS
10.0000 mg | ORAL_TABLET | Freq: Two times a day (BID) | ORAL | Status: DC
  Administered 2019-12-23: 10 mg via ORAL
  Filled 2019-12-23 (×2): qty 1

## 2019-12-23 MED ORDER — NAPROXEN 125 MG/5ML PO SUSP
250.0000 mg | Freq: Three times a day (TID) | ORAL | Status: AC
  Administered 2019-12-23 – 2019-12-25 (×5): 250 mg
  Filled 2019-12-23 (×6): qty 10

## 2019-12-23 MED ORDER — POTASSIUM CHLORIDE 10 MEQ/50ML IV SOLN
10.0000 meq | INTRAVENOUS | Status: DC
  Administered 2019-12-23 (×2): 10 meq via INTRAVENOUS
  Filled 2019-12-23 (×4): qty 50

## 2019-12-23 MED ORDER — CLONIDINE HCL 0.2 MG PO TABS
0.1000 mg | ORAL_TABLET | Freq: Four times a day (QID) | ORAL | Status: AC
  Administered 2019-12-23 – 2019-12-24 (×2): 0.1 mg
  Filled 2019-12-23: qty 1

## 2019-12-23 MED ORDER — CLONIDINE HCL 0.2 MG PO TABS
0.1000 mg | ORAL_TABLET | Freq: Two times a day (BID) | ORAL | Status: AC
  Filled 2019-12-23: qty 1

## 2019-12-23 MED ORDER — BUSPIRONE HCL 5 MG PO TABS
10.0000 mg | ORAL_TABLET | Freq: Two times a day (BID) | ORAL | Status: DC
  Administered 2019-12-23 – 2020-01-07 (×28): 10 mg
  Filled 2019-12-23: qty 1
  Filled 2019-12-23: qty 2
  Filled 2019-12-23: qty 1
  Filled 2019-12-23: qty 2
  Filled 2019-12-23 (×5): qty 1
  Filled 2019-12-23: qty 2
  Filled 2019-12-23 (×2): qty 1
  Filled 2019-12-23 (×2): qty 2
  Filled 2019-12-23 (×3): qty 1
  Filled 2019-12-23: qty 2
  Filled 2019-12-23 (×2): qty 1
  Filled 2019-12-23: qty 2
  Filled 2019-12-23: qty 1
  Filled 2019-12-23: qty 2
  Filled 2019-12-23 (×3): qty 1
  Filled 2019-12-23 (×2): qty 2
  Filled 2019-12-23: qty 1
  Filled 2019-12-23 (×2): qty 2

## 2019-12-23 MED ORDER — SODIUM CHLORIDE 0.9 % IV SOLN
100.0000 mg | Freq: Two times a day (BID) | INTRAVENOUS | Status: DC
  Filled 2019-12-23 (×2): qty 100

## 2019-12-23 NOTE — Consult Note (Signed)
WOC Nurse wound follow up Wound type: unstageable pressure injury Measurement: 8cm x 8cm x 3.0cm  Wound bed:95% black; 5% pink along perimeter from 3-9 o'clock  Drainage (amount, consistency, odor) see PT notes Periwound: intact  Dressing procedure/placement/frequency: Patient seen with PT during hydrotherapy treatment. Patient is on the vent currently. LALM in place with Sport mattress in the ICU. Re-ordered RD to address wound healing needs with supplements as tolerated.  Continue hydrotherapy; DC dakins Restart enzymatic debridement ointment daily with saline moist gauze. PT to perform with hydrotherapy treatments and bedside nursing to perform on Sundays.   WOC Nurse team will follow with you and see patient within 10 days for wound assessments.  Please notify WOC nurses of any acute changes in the wounds or any new areas of concern Axl Rodino [facility] MSN, RN,CWOCN, CNS, CWON-AP [phone]

## 2019-12-23 NOTE — Progress Notes (Signed)
Tentative rescheduling of gastric tube placement for 5/18  Only if blood cultures remain negative

## 2019-12-23 NOTE — Progress Notes (Signed)
Physical Therapy Wound Treatment Patient Details  Name: [name] MRN: [id number] Date of Birth: [birth year]  Today's Date: 12/23/2019 Time: 3235-5732 Time Calculation (min): 37 min  Subjective  Subjective: Pt communicates with head nods to agree to session.  Facial grimmacing noted with movement and positioning.  Patient and Family Stated Goals: intubated and sedated Date of Onset: (unknown) Prior Treatments: moist gauze and santyl   Pain Score:    Wound Assessment  Pressure Injury 12/12/19 Coccyx Mid Unstageable - Full thickness tissue loss in which the base of the injury is covered by slough (yellow, tan, gray, green or brown) and/or eschar (tan, brown or black) in the wound bed. (Active)  Dressing Type ABD;Moist to moist;Other (Comment);Barrier Film (skin prep) 12/23/19 1524  Dressing Changed 12/23/19 1524  Dressing Change Frequency Daily 12/23/19 1524  State of Healing Eschar 12/23/19 1524  Site / Wound Assessment Black;Red;Yellow;Brown 12/23/19 1524  % Wound base Red or Granulating 15% 12/23/19 1524  % Wound base Yellow/Fibrinous Exudate 85% 12/23/19 1524  % Wound base Black/Eschar 90% 12/16/19 1000  Peri-wound Assessment Erythema (blanchable) 12/23/19 1524  Wound Length (cm) 8.5 cm 12/17/19 1400  Wound Width (cm) 7.5 cm 12/17/19 1400  Wound Surface Area (cm^2) 63.75 cm^2 12/17/19 1400  Margins Unattached edges (unapproximated) 12/23/19 1524  Drainage Amount Moderate 12/23/19 1524  Drainage Description Serosanguineous;Green;Odor 12/23/19 1524  Treatment Cleansed;Hydrotherapy (Pulse lavage);Tape changed;Packing (Saline gauze);Debridement (Selective) 12/23/19 1524  Santyl applied to wound bed prior to applying dressing.  Hydrotherapy Pulsed lavage therapy - wound location: sacral wound Pulsed Lavage with Suction (psi): 12 psi Pulsed Lavage with Suction - Normal Saline Used: 1000 mL Pulsed Lavage Tip: Tip with splash shield Selective Debridement Selective Debridement -  Location: sacral wound Selective Debridement - Tools Used: Forceps;Scalpel;Scissors Selective Debridement - Tissue Removed: eschar softened by santyl and pulse lavage   Wound Assessment and Plan  Wound Therapy - Assess/Plan/Recommendations Wound Therapy - Clinical Statement: Per RN patient has completed dakins soultion so santyl applied to wound bed post session.  Pt's eschar is becoming softer and able to debride more this session but in some places it continues to be adherent.  Post debridement scored wound to allow for santyl to soften wound bed easier.  Pt continues to benefit from hydro therapy to cleanse wound, debride necrotic tissue and decrease bioburden to improve wound bed healing.   Wound Therapy - Functional Problem List: sedated, and immobile Factors Delaying/Impairing Wound Healing: Immobility;Incontinence;Multiple medical problems Hydrotherapy Plan: Debridement;Pulsatile lavage with suction;Patient/family education Wound Therapy - Frequency: 6X / week Wound Therapy - Follow Up Recommendations: Skilled nursing facility Wound Plan: see above  Wound Therapy Goals- Improve the function of patient's integumentary system by progressing the wound(s) through the phases of wound healing (inflammation - proliferation - remodeling) by: Decrease Necrotic Tissue to: 5 Decrease Necrotic Tissue - Progress: Progressing toward goal Increase Granulation Tissue to: 95 Increase Granulation Tissue - Progress: Progressing toward goal Decrease Length/Width/Depth by (cm): 10% Decrease Length/Width/Depth - Progress: Progressing toward goal Goals/treatment plan/discharge plan were made with and agreed upon by patient/family: No, Patient unable to participate in goals/treatment/discharge plan and family unavailable Time For Goal Achievement: 7 days Wound Therapy - Potential for Goals: Good  Goals will be updated until maximal potential achieved or discharge criteria met.  Discharge criteria: when  goals achieved, discharge from hospital, MD decision/surgical intervention, no progress towards goals, refusal/missing three consecutive treatments without notification or medical reason.  GP     [name] Eli Hose 12/23/2019, 3:30  PM  Erasmo Leventhal , PTA Acute Rehabilitation Services Pager [phone] Office [phone]

## 2019-12-23 NOTE — Progress Notes (Signed)
NAME:  [name], MRN:  [id number], DOB:  [birth year], LOS: 45 ADMISSION DATE:  11/27/19, CONSULTATION DATE:  4/5 REFERRING MD:  Jerral Madsen, CHIEF COMPLAINT:  Dyspnea   Brief History   64 year old man admitted 4/2 with COVID pneumonia and delirium after 1 week of symptoms.  Intubated on 4/5, required prolonged mechanical ventilatory support.  Tracheostomy performed on 4/20  5/17 pending LTAC placement- -this is delayed due to leukocytosis, fevers and some difficult vent weaning  Past Medical History    Allergic rhinitis   . Arthritis of left acromioclavicular joint 07/25/2014  . Asthma   . Chronic bronchitis (HCC)   . Chronic kidney disease    H/O STONES  . Colon polyp   . Coronary artery disease   . Diabetes mellitus without complication (HCC)   . Diverticula of colon   . GERD (gastroesophageal reflux disease)   . Hx of heart bypass surgery 2005  . Hypercholesteremia   . Impingement syndrome of left shoulder 07/25/2014  . Laceration of brachial artery 03/11/2016   left arm  . Lumbar disc disease   . Partial nontraumatic rupture of right rotator cuff 10/20/2016    Significant Hospital Events   4/19: tachypneic overnight and this am. Unable to wean sedation. Tracheostomy done  4/20: successful trach yesterday but de-recruited and on some escalated settings today. Remains tachypneic this am. Hypoglycemic yesterday and lantus changed 4/22: started on empiric abx but resp cx with abundant gpc. Improving oxygenation requirement but remains with rass -4 despite light sedation.  4/23:mucous plugging overnight and this am with copious/thick secretions.  4/28: issues overnight with increased vent settings. Still without purposeful responses. Fever overnight as well to 101.1. remains minimally responsive and unable to wean iv sedation except for short periods, despite increase in po agents.  4/29: somewhat improved settings overnight on 60/8. Still with temps overnight to  100.7 5/1: Continued improvement in vent requirement 50/8, remains on several sedating medications including continuous fentanyl and precedex. Trach changed to 6.0 shiley XLT. 5/7 tolerating pressure support ventilation while on sedation 5/16 off sedative infusions and on to trach collar. Tolerated x 3 hours 5/17 CPAP overnight. PRN APAP for fevers.  Consults:    Procedures:  04/05 Rt CVC > 4/24 04/16 Bronchoscopy  04/20Tracheostomy 4/24 Rt PICC  Significant Diagnostic Tests:  4/22 CXR: 1. Lines and tubes stable position. 2. Persistent bibasilar pulmonary infiltrates/edema with interim slight clearing from prior exam. 3. Prior CABG. Heart size stable.  4/24 CXR: Slight increase in interstitial and airspace opacities particularly in the lower lobes. Findings suspicious for worsening asymmetric edema or infection.  Head CT 4/27 Mild chronic ischemic white matter disease. No acute intracranial abnormality seen.  Abdominal CT 4/27 > for PEG, trace pneumoperitoneum, extensive airspace disease due to COVID 19, kidney stone left, non-obstructing  MRI brain 5/3 > NAICP, chronic small vessel ischemia, chronic microhemorrhages, stable mild generalized atrophy, ethmoid sinus thickening, bilateral mastoid effusions  Chest CT 5/11 > 1. Pulmonary fibrosis with UIP pattern, new from 2015. 2. There is lower lobe atelectasis. No edema or convincing pneumonia.  Micro Data:  COVID 4/2 >Positive  Blood cultures 4/2 > negative  Respiratory culture 4/5 > Negative  Respiratory culture 4/12 > Normal flora  Respiratory culture 4/21 > Normal flora  BCx 5/15> no growth 2 days Tracheal aspirate 5/14> Rare GPR-- consistent with normal respiratory flora  Sacral wound cx 5/15> Few GPC few GNR   Antimicrobials:  4/21 Vancomycin > 4/23 4/21 Zosyn >  4/26  Interim history/subjective:  CPAP. Increased RR with vent weaning and stimulation Starting clonidine today for anxiolysis   Objective    Blood pressure 102/66, pulse 86, temperature 98.1 F (36.7 C), temperature source Axillary, resp. rate 15, height 5\' 8"  (1.727 m), weight 74.4 kg, SpO2 99 %.    Vent Mode: PSV;CPAP FiO2 (%):  [35 %-40 %] 40 % PEEP:  [5 cmH20] 5 cmH20 Pressure Support:  [10 cmH20] 10 cmH20   Intake/Output Summary (Last 24 hours) at 12/23/2019 0811 Last data filed at 12/23/2019 0630 Gross per 24 hour  Intake 2701.69 ml  Output 1300 ml  Net 1401.69 ml   Filed Weights   12/21/19 0259 12/22/19 0500 12/23/19 0500  Weight: 78.1 kg 74.7 kg 74.4 kg    Examination: General:  Frai,, chronically ill adult M, trach/vent HEENT: NCAT. Tracheostomy secure. Anicteric sclera. Trachea midline Neuro: Awake, mildly sedate. Intermittently following commands. PERRL CV: RRR s1s2 no rgm. Cap refill < 3 seconds  PULM: Symmetrical chest expansion on PSV/CPAP. No accessory muscle use. Increased RR with stimulation.  GI: Mild distention, soft. + bowel sounds x4  GU: External catheter. Yellow uring  Extremities: Decreased muscle mass BUE BLE.  Skin: c/d/w. Sacral wound    Resolved Hospital Problem list   COVID-19 pneumonia -Has completed full course of remdesivir and dexamethasone, off isolation 4/23.  HCAP Pneumomediastinum/pneumopericardium  Assessment & Plan:   Acute respiratory failure, VDRF, s/p tracheostomy in setting of COVID-19 PNA, deconditioning Post COVID pulmonary fibrosis  -Pulmonary fibrosis with UIP pattern on chest CT 5/11 -Increased RR during TCT, stimulation, prohibiting successful vent liberation - This is an anxiety or behavioral issue rather than underlying lung disease.  He [place] be more comfortable off of mechanical ventilation. P -Clonidine taper -Adding Buspar  -Cont pulm hygiene  -Open-ended  trach collar trial.    Toxic metabolic encephalopathy related to prolonged sedation. Anxiety -manifesting as increased RR P -Wean sedation as able -Clonidine taper, fentanyl IV + patch   -Adding Buspar   Fever, leukocytosis ? Sepsis related to sacral wound vs central / reactive process P -WOC -hold off systemic abx for now -cont APAP. Adding naproxen   Positive Cumulative Fluid Balance  -Cont diuresis    Urinary retention  -External catheter in place    Goals of care:  Needs LTAC placement.  Will need to be afebrile + resolution of acute medical problems  Conversation between PCCM in peer-peer 5/16 progress note: "peer to peer with [facility] MD regarding LTAC placement. Despite the fact the patient is weaning , and mental status has improved, they have denied him placement due to fever of 101.1 with increased WBC within the last 24 hours. Per the MD, Medicare rules are clear, and he cannot have any ongoing acute medical issues when being assessed for  admission to LTAC. I have told them we will call for reassessment once fever and WBC have returned to within normal parameters, as he is very appropriate for LTAC placement"  Best practice:  Diet:  EN Pain/Anxiety/Delirium protocol (if indicated): as above VAP protocol (if indicated): yes DVT prophylaxis: lovenox 40mg  daily GI prophylaxis: PPI Glucose control: Lantus+ SSI Mobility: Bed in chair Code Status: full Family Communication: Pending 5/17 Disposition: ICU, goal LTAC pending improvement of acute issures  Labs   CBC: Recent Labs  Lab 12/17/19 0415 12/17/19 0415 12/18/19 0459 12/18/19 1232 12/20/19 0424 12/21/19 0449 12/23/19 0506  WBC 14.7*  --  11.9*  --  16.2* 15.2* 17.8*  NEUTROABS  --   --  5.4  --   --  7.7 11.0*  HGB 9.1*   < > 8.7* 9.9* 9.4* 8.8* 8.7*  HCT 29.7*   < > 28.3* 29.0* 30.5* 29.3* 29.3*  MCV 93.7  --  93.1  --  93.0 93.9 94.5  PLT 395  --  344  --  362 325 275   < > = values in this interval not displayed.    Basic Metabolic Panel: Recent Labs  Lab 12/18/19 0459 12/18/19 0459 12/18/19 1232 12/18/19 1232 12/18/19 1722 12/19/19 0438 12/20/19 0424 12/21/19 0449  12/23/19 0506  NA 140   < > 140  --   --  141 143 144 144  K 2.9*   < > 4.2   < > 3.9 3.5 3.5 3.6 3.3*  CL 96*  --   --   --   --  99 100 106 102  CO2 34*  --   --   --   --  33* 36* 30 31  GLUCOSE 154*  --   --   --   --  115* 116* 184* 161*  BUN 38*  --   --   --   --  39* 52* 45* 30*  CREATININE 0.54*  --   --   --   --  0.45* 0.51* 0.38* 0.38*  CALCIUM 8.6*  --   --   --   --  8.6* 8.7* 8.5* 8.7*  MG  --   --   --   --   --   --  2.0 2.0  --   PHOS  --   --   --   --   --   --  5.1*  --   --    < > = values in this interval not displayed.   GFR: Estimated Creatinine Clearance: 89.1 mL/min (A) (by C-G formula based on SCr of 0.38 mg/dL (L)). Recent Labs  Lab 12/18/19 0459 12/20/19 0424 12/21/19 0449 12/23/19 0506  WBC 11.9* 16.2* 15.2* 17.8*    Liver Function Tests: No results for input(s): AST, ALT, ALKPHOS, BILITOT, PROT, ALBUMIN in the last 168 hours. No results for input(s): LIPASE, AMYLASE in the last 168 hours. No results for input(s): AMMONIA in the last 168 hours.  ABG    Component Value Date/Time   PHART 7.449 12/18/2019 1232   PCO2ART 52.5 (H) 12/18/2019 1232   PO2ART 68 (L) 12/18/2019 1232   HCO3 36.4 (H) 12/18/2019 1232   TCO2 38 (H) 12/18/2019 1232   ACIDBASEDEF 1.0 11/11/2019 1529   O2SAT 94.0 12/18/2019 1232     Coagulation Profile: Recent Labs  Lab 12/19/19 0438  INR 1.1    Cardiac Enzymes: No results for input(s): CKTOTAL, CKMB, CKMBINDEX, TROPONINI in the last 168 hours.  HbA1C: Hgb A1c MFr Bld  Date/Time Value Ref Range Status  11/16/2019 06:51 PM 7.5 (H) 4.8 - 5.6 % Final    Comment:    (NOTE) Pre diabetes:          5.7%-6.4% Diabetes:              >6.4% Glycemic control for   <7.0% adults with diabetes   12/19/2018 02:21 PM 7.7 (H) 4.6 - 6.5 % Final    Comment:    Glycemic Control Guidelines for People with Diabetes:Non Diabetic:  <6%Goal of Therapy: <7%Additional Action Suggested:  >8%     CBG: Recent Labs  Lab  12/22/19 1647 12/22/19 2013 12/22/19 2130 12/23/19 0448 12/23/19 8657  [place]      CRITICAL CARE Performed by: Cristal Generous   Total critical care time: 37 minutes  Critical care time was exclusive of separately billable procedures and treating other patients. Critical care was necessary to treat or prevent imminent or life-threatening deterioration.  Critical care was time spent personally by me on the following activities: development of treatment plan with patient and/or surrogate as well as nursing, discussions with consultants, evaluation of patient's response to treatment, examination of patient, obtaining history from patient or surrogate, ordering and performing treatments and interventions, ordering and review of laboratory studies, ordering and review of radiographic studies, pulse oximetry and re-evaluation of patient's condition.  Eliseo Gum MSN, AGACNP-BC [facility] [phone] If no answer, [phone] 12/23/2019, 10:07 AM

## 2019-12-23 NOTE — Progress Notes (Signed)
Replaced electrolytes per protocol

## 2019-12-24 ENCOUNTER — Inpatient Hospital Stay (HOSPITAL_COMMUNITY): Payer: Medicare Other

## 2019-12-24 HISTORY — PX: IR GASTROSTOMY TUBE MOD SED: IMG625

## 2019-12-24 LAB — CBC
HCT: 28.5 % — ABNORMAL LOW (ref 39.0–52.0)
Hemoglobin: 8.7 g/dL — ABNORMAL LOW (ref 13.0–17.0)
MCH: 28.7 pg (ref 26.0–34.0)
MCHC: 30.5 g/dL (ref 30.0–36.0)
MCV: 94.1 fL (ref 80.0–100.0)
Platelets: 271 10*3/uL (ref 150–400)
RBC: 3.03 MIL/uL — ABNORMAL LOW (ref 4.22–5.81)
RDW: 15.3 % (ref 11.5–15.5)
WBC: 19.9 10*3/uL — ABNORMAL HIGH (ref 4.0–10.5)
nRBC: 0 % (ref 0.0–0.2)

## 2019-12-24 LAB — GLUCOSE, CAPILLARY
Glucose-Capillary: 76 mg/dL (ref 70–99)
Glucose-Capillary: 97 mg/dL (ref 70–99)
Glucose-Capillary: 108 mg/dL — ABNORMAL HIGH (ref 70–99)
Glucose-Capillary: 168 mg/dL — ABNORMAL HIGH (ref 70–99)
Glucose-Capillary: 125 mg/dL — ABNORMAL HIGH (ref 70–99)
Glucose-Capillary: 106 mg/dL — ABNORMAL HIGH (ref 70–99)

## 2019-12-24 LAB — AEROBIC/ANAEROBIC CULTURE W GRAM STAIN (SURGICAL/DEEP WOUND): Gram Stain: NONE SEEN

## 2019-12-24 LAB — BASIC METABOLIC PANEL
Anion gap: 11 (ref 5–15)
BUN: 33 mg/dL — ABNORMAL HIGH (ref 8–23)
CO2: 31 mmol/L (ref 22–32)
Calcium: 8.8 mg/dL — ABNORMAL LOW (ref 8.9–10.3)
Chloride: 103 mmol/L (ref 98–111)
Creatinine, Ser: 0.33 mg/dL — ABNORMAL LOW (ref 0.61–1.24)
GFR calc Af Amer: >60 mL/min (ref >60)
GFR calc non Af Amer: >60 mL/min (ref >60)
Glucose, Bld: 91 mg/dL (ref 70–99)
Potassium: 3.7 mmol/L (ref 3.5–5.1)
Sodium: 145 mmol/L (ref 135–145)

## 2019-12-24 MED ORDER — MIDAZOLAM HCL 2 MG/2ML IJ SOLN
INTRAMUSCULAR | Status: AC | PRN
  Administered 2019-12-24 (×2): 1 mg via INTRAVENOUS

## 2019-12-24 MED ORDER — POTASSIUM CHLORIDE 20 MEQ/15ML (10%) PO SOLN
40.0000 meq | Freq: Once | ORAL | Status: AC
  Administered 2019-12-24: 40 meq
  Filled 2019-12-24: qty 30

## 2019-12-24 MED ORDER — IOHEXOL 300 MG/ML  SOLN
50.0000 mL | Freq: Once | INTRAMUSCULAR | Status: AC | PRN
  Administered 2019-12-24: 10 mL

## 2019-12-24 MED ORDER — GLUCAGON HCL RDNA (DIAGNOSTIC) 1 MG IJ SOLR
INTRAMUSCULAR | Status: AC
  Filled 2019-12-24: qty 1

## 2019-12-24 MED ORDER — FENTANYL CITRATE (PF) 100 MCG/2ML IJ SOLN
INTRAMUSCULAR | Status: AC
  Filled 2019-12-24: qty 2

## 2019-12-24 MED ORDER — FENTANYL CITRATE (PF) 100 MCG/2ML IJ SOLN
100.0000 ug | INTRAMUSCULAR | Status: DC | PRN
  Administered 2019-12-24 – 2020-01-03 (×24): 100 ug via INTRAVENOUS
  Filled 2019-12-24 (×25): qty 2

## 2019-12-24 MED ORDER — LIDOCAINE HCL 1 % IJ SOLN
INTRAMUSCULAR | Status: AC | PRN
  Administered 2019-12-24: 10 mL

## 2019-12-24 MED ORDER — ONDANSETRON HCL 4 MG/2ML IJ SOLN: 4.0000 mg | INTRAMUSCULAR | Status: DC | PRN

## 2019-12-24 MED ORDER — LIDOCAINE HCL 1 % IJ SOLN
INTRAMUSCULAR | Status: AC
  Filled 2019-12-24: qty 20

## 2019-12-24 MED ORDER — MIDODRINE HCL 5 MG PO TABS: 5.0000 mg | ORAL_TABLET | Freq: Three times a day (TID) | ORAL | Status: DC

## 2019-12-24 MED ORDER — HYDROMORPHONE HCL 1 MG/ML IJ SOLN: 1.0000 mg | INTRAMUSCULAR | Status: DC | PRN

## 2019-12-24 MED ORDER — GLUCAGON HCL (RDNA) 1 MG IJ SOLR
INTRAMUSCULAR | Status: AC | PRN
  Administered 2019-12-24: .5 mg via INTRAVENOUS

## 2019-12-24 MED ORDER — MIDAZOLAM HCL 2 MG/2ML IJ SOLN
INTRAMUSCULAR | Status: AC
  Filled 2019-12-24: qty 2

## 2019-12-24 MED ORDER — FENTANYL CITRATE (PF) 100 MCG/2ML IJ SOLN
INTRAMUSCULAR | Status: AC | PRN
  Administered 2019-12-24 (×2): 50 ug via INTRAVENOUS

## 2019-12-24 MED ORDER — FUROSEMIDE 10 MG/ML IJ SOLN
40.0000 mg | Freq: Once | INTRAMUSCULAR | Status: AC
  Administered 2019-12-24: 40 mg via INTRAVENOUS
  Filled 2019-12-24: qty 4

## 2019-12-24 MED ORDER — HYDROCODONE-ACETAMINOPHEN 5-325 MG PO TABS
1.0000 | ORAL_TABLET | ORAL | Status: DC | PRN
  Administered 2019-12-25 – 2019-12-31 (×9): 2 via ORAL
  Administered 2020-01-01: 1 via ORAL
  Administered 2020-01-04: 2 via ORAL
  Administered 2020-01-05: 1 via ORAL
  Administered 2020-01-05 – 2020-01-07 (×3): 2 via ORAL
  Filled 2019-12-24: qty 1
  Filled 2019-12-24 (×3): qty 2
  Filled 2019-12-24 (×2): qty 1
  Filled 2019-12-24 (×11): qty 2

## 2019-12-24 MED ORDER — PIPERACILLIN-TAZOBACTAM 3.375 G IVPB
3.3750 g | Freq: Three times a day (TID) | INTRAVENOUS | Status: AC
  Administered 2019-12-24 – 2020-01-03 (×30): 3.375 g via INTRAVENOUS
  Filled 2019-12-24 (×33): qty 50

## 2019-12-24 NOTE — Progress Notes (Addendum)
NAME:  [name], MRN:  [id number], DOB:  11-16-1955, LOS: 91 ADMISSION DATE:  2019/11/30, CONSULTATION DATE:  4/5 REFERRING MD:  Sloan Leiter, CHIEF COMPLAINT:  Dyspnea   Brief History   64 year old man admitted 4/2 with COVID pneumonia and delirium after 1 week of symptoms.  Intubated on 4/5, required prolonged mechanical ventilatory support.  Tracheostomy performed on 4/20  5/17 pending LTAC placement- -this is delayed due to leukocytosis, fevers and some difficult vent weaning  Past Medical History    Allergic rhinitis   . Arthritis of left acromioclavicular joint 07/25/2014  . Asthma   . Chronic bronchitis ([place])   . Chronic kidney disease    H/O STONES  . Colon polyp   . Coronary artery disease   . Diabetes mellitus without complication ([place])   . Diverticula of colon   . GERD (gastroesophageal reflux disease)   . Hx of heart bypass surgery 2005  . Hypercholesteremia   . Impingement syndrome of left shoulder 07/25/2014  . Laceration of brachial artery 03/11/2016   left arm  . Lumbar disc disease   . Partial nontraumatic rupture of right rotator cuff 10/20/2016    Significant Hospital Events   4/19: tachypneic overnight and this am. Unable to wean sedation. Tracheostomy done  4/20: successful trach yesterday but de-recruited and on some escalated settings today. Remains tachypneic this am. Hypoglycemic yesterday and lantus changed 4/22: started on empiric abx but resp cx with abundant gpc. Improving oxygenation requirement but remains with rass -4 despite light sedation.  4/23:mucous plugging overnight and this am with copious/thick secretions.  4/28: issues overnight with increased vent settings. Still without purposeful responses. Fever overnight as well to 101.1. remains minimally responsive and unable to wean iv sedation except for short periods, despite increase in po agents.  4/29: somewhat improved settings overnight on 60/8. Still with temps overnight to  100.7 5/1: Continued improvement in vent requirement 50/8, remains on several sedating medications including continuous fentanyl and precedex. Trach changed to 6.0 shiley XLT. 5/7 tolerating pressure support ventilation while on sedation 5/16 off sedative infusions and on to trach collar. Tolerated x 3 hours 5/17 CPAP overnight. PRN APAP for fevers.  5/18 No issues overnight, placed on ATC this AM and tolerating well  Consults:    Procedures:  04/05 Rt CVC > 4/24 04/16 Bronchoscopy  04/20Tracheostomy 4/24 Rt PICC  Significant Diagnostic Tests:  4/22 CXR: 1. Lines and tubes stable position. 2. Persistent bibasilar pulmonary infiltrates/edema with interim slight clearing from prior exam. 3. Prior CABG. Heart size stable.  4/24 CXR: Slight increase in interstitial and airspace opacities particularly in the lower lobes. Findings suspicious for worsening asymmetric edema or infection.  Head CT 4/27 Mild chronic ischemic white matter disease. No acute intracranial abnormality seen.  Abdominal CT 4/27 > for PEG, trace pneumoperitoneum, extensive airspace disease due to COVID 19, kidney stone left, non-obstructing  MRI brain 5/3 > NAICP, chronic small vessel ischemia, chronic microhemorrhages, stable mild generalized atrophy, ethmoid sinus thickening, bilateral mastoid effusions  Chest CT 5/11 > 1. Pulmonary fibrosis with UIP pattern, new from 2015. 2. There is lower lobe atelectasis. No edema or convincing pneumonia.  Micro Data:  COVID 4/2 >Positive  Blood cultures 4/2 > negative  Respiratory culture 4/5 > Negative  Respiratory culture 4/12 > Normal flora  Respiratory culture 4/21 > Normal flora  BCx 5/15> no growth 2 days Tracheal aspirate 5/14> Rare GPR-- consistent with normal respiratory flora  Sacral wound cx 5/15> Few  GPC few GNR   Antimicrobials:  4/21 Vancomycin > 4/23 4/21 Zosyn > 4/26  Interim history/subjective:  No issues overnight, currently on ATC  this AM and tolerating well.   Objective   Blood pressure 124/86, pulse (!) 104, temperature 98.7 F (37.1 C), temperature source Oral, resp. rate (!) 33, height 5\' 8"  (1.727 m), weight 79.3 kg, SpO2 100 %.    Vent Mode: CPAP;PSV FiO2 (%):  [40 %] 40 % PEEP:  [5 cmH20] 5 cmH20 Pressure Support:  [10 cmH20] 10 cmH20 Plateau Pressure:  [16 cmH20-20 cmH20] 20 cmH20   Intake/Output Summary (Last 24 hours) at 12/24/2019 0817 Last data filed at 12/24/2019 0800 Gross per 24 hour  Intake 1981.16 ml  Output 1600 ml  Net 381.16 ml   Filed Weights   12/22/19 0500 12/23/19 0500 12/24/19 0353  Weight: 74.7 kg 74.4 kg 79.3 kg    Examination: General: Chronically ill appearing very deconditioned elderly male on ATC this am, in NAD  HEENT: #6 cuffed shiley trach midline, MM pink/moist, PERRL, sclera non-icteric  Neuro: Alert and able to follow simple commands, appears generalized very weak CV: s1s2 regular rate and rhythm, no murmur, rubs, or gallops,  PULM:  Clear to ascultation bilaterally, no increased work of breathing, oxygen saturation 93-98 on ATC  GI: soft, bowel sounds active in all 4 quadrants, non-tender, non-distended, TF on hold for possible PEG Extremities: warm/dry, non-pitting  edema  Skin: no rashes or lesions, unstageable sacral pressure injury   Resolved Hospital Problem list   COVID-19 pneumonia -Has completed full course of remdesivir and dexamethasone, off isolation 4/23.  HCAP Pneumomediastinum/pneumopericardium Urinary retention  Assessment & Plan:   Acute respiratory failure, VDRF, s/p tracheostomy in setting of COVID-19 PNA, deconditioning Post COVID pulmonary fibrosis  -Pulmonary fibrosis with UIP pattern on chest CT 5/11 -Increased RR during TCT, stimulation, prohibiting successful vent liberation -This is an anxiety or behavioral issue rather than underlying lung disease.  He Para be more comfortable off of mechanical ventilation P Continue Clonidine taper   Buspar added  Aggressive pulmonary hygiene  Continue ATC as tolerated  Continue IV antibiotics   Toxic metabolic encephalopathy related to prolonged sedation. Anxiety -manifesting as increased RR P Wean sedation, was able to come off Fentanyl drip yesterday but it was restarted overnight Discontinue fentanyl drip  Clonidine taper as above  Continue fentanyl patch   Scaral pseudomonas wound infection -Sacral wound positive for pseudomonas  Unstageable sacral pressure ulcer not POA P Start Zosyn  Follow cultures  WOC consulted  Wound care Frequent turns Continue Tylenol and Naproxen   Positive Cumulative Fluid Balance  P: Continue to diurese as able  Monitor volume status   Goals of care:  Needs LTAC placement.  Will need to be afebrile + resolution of acute medical problems  Conversation between PCCM in peer-peer 5/16 progress note: "peer to peer with [facility] MD regarding LTAC placement. Despite the fact the patient is weaning , and mental status has improved, they have denied him placement due to fever of 101.1 with increased WBC within the last 24 hours. Per the MD, Medicare rules are clear, and he cannot have any ongoing acute medical issues when being assessed for  admission to LTAC. I have told them we will call for reassessment once fever and WBC have returned to within normal parameters, as he is very appropriate for LTAC placement"  Best practice:  Diet:  EN Pain/Anxiety/Delirium protocol (if indicated): as above VAP protocol (if indicated): yes DVT  prophylaxis: lovenox 40mg  daily GI prophylaxis: PPI Glucose control: Lantus+ SSI Mobility: Bed in chair Code Status: full Family Communication: Attempted to called spouse for updated morning of 5/18 with no response will attempt again later today  Disposition: ICU, goal LTAC pending improvement of acute issures  Labs   CBC: Recent Labs  Lab 12/18/19 0459 12/18/19 0459 12/18/19 1232 12/20/19 0424 12/21/19 0449  12/23/19 0506 12/24/19 0717  WBC 11.9*  --   --  16.2* 15.2* 17.8* 19.9*  NEUTROABS 5.4  --   --   --  7.7 11.0*  --   HGB 8.7*   < > 9.9* 9.4* 8.8* 8.7* 8.7*  HCT 28.3*   < > 29.0* 30.5* 29.3* 29.3* 28.5*  MCV 93.1  --   --  93.0 93.9 94.5 94.1  PLT 344  --   --  362 325 275 271   < > = values in this interval not displayed.    Basic Metabolic Panel: Recent Labs  Lab 12/19/19 0438 12/20/19 0424 12/21/19 0449 12/23/19 0506 12/24/19 0336  NA 141 143 144 144 145  K 3.5 3.5 3.6 3.3* 3.7  CL 99 100 106 102 103  CO2 33* 36* 30 31 31   GLUCOSE 115* 116* 184* 161* 91  BUN 39* 52* 45* 30* 33*  CREATININE 0.45* 0.51* 0.38* 0.38* 0.33*  CALCIUM 8.6* 8.7* 8.5* 8.7* 8.8*  MG  --  2.0 2.0  --   --   PHOS  --  5.1*  --   --   --    GFR: Estimated Creatinine Clearance: 89.1 mL/min (A) (by C-G formula based on SCr of 0.33 mg/dL (L)). Recent Labs  Lab 12/20/19 0424 12/21/19 0449 12/23/19 0506 12/24/19 0717  WBC 16.2* 15.2* 17.8* 19.9*    Liver Function Tests: No results for input(s): AST, ALT, ALKPHOS, BILITOT, PROT, ALBUMIN in the last 168 hours. No results for input(s): LIPASE, AMYLASE in the last 168 hours. No results for input(s): AMMONIA in the last 168 hours.  ABG    Component Value Date/Time   PHART 7.449 12/18/2019 1232   PCO2ART 52.5 (H) 12/18/2019 1232   PO2ART 68 (L) 12/18/2019 1232   HCO3 36.4 (H) 12/18/2019 1232   TCO2 38 (H) 12/18/2019 1232   ACIDBASEDEF 1.0 11/11/2019 1529   O2SAT 94.0 12/18/2019 1232     Coagulation Profile: Recent Labs  Lab 12/19/19 0438  INR 1.1    Cardiac Enzymes: No results for input(s): CKTOTAL, CKMB, CKMBINDEX, TROPONINI in the last 168 hours.  HbA1C: Hgb A1c MFr Bld  Date/Time Value Ref Range Status  11/11/2019 06:51 PM 7.5 (H) 4.8 - 5.6 % Final    Comment:    (NOTE) Pre diabetes:          5.7%-6.4% Diabetes:              >6.4% Glycemic control for   <7.0% adults with diabetes   12/19/2018 02:21 PM 7.7 (H) 4.6 - 6.5  % Final    Comment:    Glycemic Control Guidelines for People with Diabetes:Non Diabetic:  <6%Goal of Therapy: <7%Additional Action Suggested:  >8%     CBG: Recent Labs  Lab 12/23/19 2005 12/23/19 2357 12/24/19 0343 12/24/19 0604 12/24/19 0756  GLUCAP 120* 163* 76 97 108*      CRITICAL CARE Performed by: 12/26/19   Total critical care time: 37 minutes  Critical care time was exclusive of separately billable procedures and treating other patients. Critical care was necessary  to treat or prevent imminent or life-threatening deterioration.  Critical care was time spent personally by me on the following activities: development of treatment plan with patient and/or surrogate as well as nursing, discussions with consultants, evaluation of patient's response to treatment, examination of patient, obtaining history from patient or surrogate, ordering and performing treatments and interventions, ordering and review of laboratory studies, ordering and review of radiographic studies, pulse oximetry and re-evaluation of patient's condition.  Delfin Gant, NP-C [facility] Contact / Pager information can be found on Amion  12/24/2019, 8:49 AM

## 2019-12-24 NOTE — Progress Notes (Signed)
Nutrition Follow-up  DOCUMENTATION CODES:   Not applicable  INTERVENTION:   When able to resume TF via PEG, continue: Vital AF 1.2 at 75 ml/h (1800 ml per day) Pro-stat 30 ml once daily  Provides 2260 kcal, 150 gm protein, 1460 ml free water daily  Juven 1 packet via tube BID, each packet provides 80 calories, 8 grams of carbohydrate, 2.5  grams of protein (collagen), 7 grams of L-arginine and 7 grams of L-glutamine; supplement contains CaHMB, Vitamins C, E, B12 and Zinc to promote wound healing   NUTRITION DIAGNOSIS:   Inadequate oral intake related to inability to eat, acute illness as evidenced by NPO status.  Ongoing   GOAL:   Patient will meet greater than or equal to 90% of their needs  Met with TF  MONITOR:   Vent status, Labs, Weight trends, TF tolerance  REASON FOR ASSESSMENT:   Consult Wound healing  ASSESSMENT:   64 yo male with ARDS secondary to COVID-19 pnuemonia requiring intubation, acute encephalopathy with hx of heavy EtOH abuse, AKI. PMH includes DM, HTN, CAD s/p PCI/CABG  Discussed patient in ICU rounds and with RN today. TF currently on hold for PEG placement today in Radiology. He has been on trach collar this morning. He tolerated trach collar for a few hours on 5/16.   Previously received Vital AF 1.2 at 75 ml/h with Pro-stat 30 ml daily and Juven BID via Cortrak tube. He has been tolerating TF well. He is receiving 2 gm/kg protein and Juven BID to help support wound healing.  Labs reviewed. BUN 33 (H), Creat 0.33 (L) CBG: 97-108-168  Medications reviewed and include lasix, novolog, lantus, tradjenta, MVI with minerals, Juven, thiamine.  Weight 79.3 kg today I/O +6.7 since admission  Diet Order:   Diet Order            Diet NPO time specified Except for: Ice Chips  Diet effective now              EDUCATION NEEDS:   Not appropriate for education at this time   Skin:  Skin Assessment: Skin Integrity Issues: Skin Integrity  Issues:: Unstageable DTI: N/A Unstageable: coccyx & bilateral buttocks  Last BM:  5/17 type 6  Height:   Ht Readings from Last 1 Encounters:  11/29/19 5' 8" (1.727 m)    Weight:   Wt Readings from Last 1 Encounters:  12/24/19 79.3 kg    BMI:  Body mass index is 26.58 kg/m.  Estimated Nutritional Needs:   Kcal:  2200-2400  Protein:  135-160 gm  Fluid:  >/= 2.2 L     H, RD, LDN, CNSC Please refer to Amion for contact information.                                                        

## 2019-12-24 NOTE — Procedures (Signed)
  Procedure: Percutaneous gastrostomy catheter placement 20f EBL:   minimal Complications:  none immediate  See full dictation in [facility] PACS.  D. Eleanor Dimichele MD Main # [phone] Pager  [phone]    

## 2019-12-24 NOTE — Progress Notes (Signed)
K 3.7 Replaced per protocol 
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
## 2019-12-24 NOTE — Progress Notes (Signed)
Physical Therapy Treatment Patient Details Name: [name] MRN: [id number] DOB: Feb 03, 1956 Today's Date: 12/24/2019    History of Present Illness pt is a 64 y/o male with pmhx significant for CAD, DM, CABG, ACDF admitted 4/2 with COVID PNA and delirium, intubated 4/5, proned and paralyzed 4/5-4/9, sedated until 4/12, Bronchoscopy for tube change 4/16, 4/19 trached.  5/16 off sedation and on TC.    PT Comments    Pt alert with eyes open focusing on who is talking.  Pt scanning and moving all 4's.  Emphasis on warm up/ROM to all 4's, transition to EOB, sitting balance/controlling truncal reactions.    Follow Up Recommendations  SNF     Equipment Recommendations  Other (comment)(TBA)    Recommendations for Other Services       Precautions / Restrictions Precautions Precautions: Fall Precaution Comments: trach, flexiseal, coretrack, large sacral decubitus    Mobility  Bed Mobility Overal bed mobility: Needs Assistance Bed Mobility: Rolling;Sidelying to Sit;Sit to Supine Rolling: Max assist;+2 for physical assistance Sidelying to sit: Max assist;+2 for physical assistance   Sit to supine: Total assist;+2 for physical assistance   General bed mobility comments: pt more alert, trying to help given time for pt to process.  Transfers                 General transfer comment: NT  Ambulation/Gait                 Stairs             Wheelchair Mobility    Modified Rankin (Stroke Patients Only)       Balance Overall balance assessment: Needs assistance Sitting-balance support: Feet supported;Single extremity supported;No upper extremity supported Sitting balance-Leahy Scale: Poor Sitting balance - Comments: Lot's of truncal reactions, not coordinated at this time.  10-12 min EOB working on upright sitting, holding his head up, scanning and making eye contact.  Overpowering abdominals over paraspinals       Standing balance comment: not ready  yet                            Cognition Arousal/Alertness: Awake/alert Behavior During Therapy: WFL for tasks assessed/performed Overall Cognitive Status: Impaired/Different from baseline Area of Impairment: Following commands;Awareness;Attention;Orientation                 Orientation Level: Place;Time;Situation;Disoriented to Current Attention Level: Focused   Following Commands: Follows one step commands inconsistently;Follows one step commands with increased time   Awareness: Intellectual          Exercises Other Exercises Other Exercises: AAROM gross flexion, graded extention gross ext bil LE's Other Exercises: ROM bil bicep/tricep pressess with graded resistance and bil shoulder ROM    General Comments General comments (skin integrity, edema, etc.): vss with sats in the mid 90's, BP up in the 150's/100's      Pertinent Vitals/Pain Pain Assessment: Faces Faces Pain Scale: No hurt Pain Intervention(s): Monitored during session    Home Living                      Prior Function            PT Goals (current goals can now be found in the care plan section) Acute Rehab PT Goals PT Goal Formulation: Patient unable to participate in goal setting Time For Goal Achievement: 12/30/19 Potential to Achieve Goals: Fair Progress towards PT goals: Progressing toward  goals    Frequency    Min 3X/week      PT Plan Current plan remains appropriate;Frequency needs to be updated    Co-evaluation              AM-PAC PT "6 Clicks" Mobility   Outcome Measure  Help needed turning from your back to your side while in a flat bed without using bedrails?: Total Help needed moving from lying on your back to sitting on the side of a flat bed without using bedrails?: Total Help needed moving to and from a bed to a chair (including a wheelchair)?: Total Help needed standing up from a chair using your arms (e.g., wheelchair or bedside chair)?:  Total Help needed to walk in hospital room?: Total Help needed climbing 3-5 steps with a railing? : Total 6 Click Score: 6    End of Session   Activity Tolerance: Patient tolerated treatment well;Patient limited by fatigue Patient left: in bed;with call bell/phone within reach Nurse Communication: Mobility status PT Visit Diagnosis: Other abnormalities of gait and mobility (R26.89);Muscle weakness (generalized) (M62.81);Difficulty in walking, not elsewhere classified (R26.2)     Time: 0254-2706 PT Time Calculation (min) (ACUTE ONLY): 23 min  Charges:  $Therapeutic Activity: 23-37 mins                     12/24/2019  Ginger Carne., PT Acute Rehabilitation Services [phone]  (pager) [phone]  (office)   Tessie Fass [name] 12/24/2019, 4:47 PM

## 2019-12-24 NOTE — Progress Notes (Signed)
Pt tolerating trach collar well this morning.  Pt transported to IR for G-tube placement on the ventilator in full support mode.  Pt tolerated transport to and from IR without difficulty.  On arrival to 18M 15, pt was placed back on .40 trach collar.

## 2019-12-24 NOTE — TOC Progression Note (Signed)
Transition of Care [facility]) - Progression Note    Patient Details  Name: [name] MRN: [id number] Date of Birth: June 13, 1956  Transition of Care [facility]) CM/SW Contact  Vester Balthazor, Manfred Arch, RN Phone Number: 12/24/2019, 1:14 PM  Clinical Narrative:   [facility] continues to follow and will restart LTACH process once pt is stable.         Expected Discharge Plan and Services                                                 Social Determinants of Health (SDOH) Interventions    Readmission Risk Interventions No flowsheet data found.

## 2019-12-24 NOTE — Progress Notes (Signed)
Physical Therapy Wound Treatment Patient Details  Name: [name] MRN: [id number] Date of Birth: [birth year]  Today's Date: 12/24/2019 Time: 1024-1057 Time Calculation (min): 33 min  Subjective  Subjective: Pt with purposeful movement today able to nod in agreement to treatment session  Patient and Family Stated Goals: continued progression of mobility  Date of Onset: (unknown) Prior Treatments: moist gauze and santyl   Pain Score:  Pt given pain medication prior to treatment session and exhibits 6/10 pain on facial pain scale with pulsed lavage and debridement.   Wound Assessment  Pressure Injury 12/12/19 Coccyx Mid Unstageable - Full thickness tissue loss in which the base of the injury is covered by slough (yellow, tan, gray, green or brown) and/or eschar (tan, brown or black) in the wound bed. (Active)  Wound Image   12/24/19 1500  Dressing Type ABD;Moist to moist;Other (Comment);Barrier Film (skin prep);Impregnated gauze (petrolatum) 12/24/19 1500  Dressing Other (Comment);Changed 12/24/19 1500  Dressing Change Frequency Daily 12/24/19 1500  State of Healing Eschar 12/24/19 1500  Site / Wound Assessment Black;Red;Yellow;Brown 12/24/19 1500  % Wound base Red or Granulating 15% 12/24/19 1500  % Wound base Yellow/Fibrinous Exudate 85% 12/24/19 1500  % Wound base Black/Eschar 90% 12/16/19 1000  Peri-wound Assessment Erythema (blanchable) 12/24/19 1500  Wound Length (cm) 8.5 cm 12/17/19 1400  Wound Width (cm) 7.5 cm 12/17/19 1400  Wound Surface Area (cm^2) 63.75 cm^2 12/17/19 1400  Margins Unattached edges (unapproximated) 12/24/19 1500  Drainage Amount Moderate 12/24/19 1500  Drainage Description Serosanguineous 12/24/19 1500  Treatment Debridement (Selective);Hydrotherapy (Pulse lavage);Packing (Saline gauze);Other (Comment) 12/24/19 1500  Santyl applied to necrotic tissue Hydrotherapy Pulsed lavage therapy - wound location: sacral wound Pulsed Lavage with Suction (psi): 12  psi Pulsed Lavage with Suction - Normal Saline Used: 1000 mL Pulsed Lavage Tip: Tip with splash shield Selective Debridement Selective Debridement - Location: sacral wound Selective Debridement - Tools Used: Forceps;Scissors Selective Debridement - Tissue Removed: hard eschar    Wound Assessment and Plan  Wound Therapy - Assess/Plan/Recommendations Wound Therapy - Clinical Statement: Pt continues to be incontinent of urine, which is possibly leaking around is condom cath creating a large area of redness in vicinity of wound, and saturating wound dressing. Requested RN check for wetness and make sure skin is cleaned of urine if it does become wet. Pt wound continues to progress and small areas of granulation tissue noted in wound bed. Periwound noted to be bleeding with removal of dressing today. Wound rimmed with petroleum gauze to decreased trauma when dressing removed. Pt continues to benefit from hydrotherapy to soften eschar for removal to reduce bioburden and improve wound healing.  Wound Therapy - Functional Problem List: sedated, and immobile Factors Delaying/Impairing Wound Healing: Immobility;Incontinence;Multiple medical problems Hydrotherapy Plan: Debridement;Pulsatile lavage with suction;Patient/family education Wound Therapy - Frequency: 6X / week Wound Therapy - Follow Up Recommendations: Skilled nursing facility Wound Plan: see above  Wound Therapy Goals- Improve the function of patient's integumentary system by progressing the wound(s) through the phases of wound healing (inflammation - proliferation - remodeling) by: Decrease Necrotic Tissue to: 5 Decrease Necrotic Tissue - Progress: Progressing toward goal Increase Granulation Tissue to: 95 Increase Granulation Tissue - Progress: Progressing toward goal Decrease Length/Width/Depth by (cm): 10% Goals/treatment plan/discharge plan were made with and agreed upon by patient/family: No, Patient unable to participate in  goals/treatment/discharge plan and family unavailable Time For Goal Achievement: 7 days Wound Therapy - Potential for Goals: Good  Goals will be updated until maximal potential achieved  or discharge criteria met.  Discharge criteria: when goals achieved, discharge from hospital, MD decision/surgical intervention, no progress towards goals, refusal/missing three consecutive treatments without notification or medical reason.  GP    Dani Gobble. Migdalia Dk PT, DPT Acute Rehabilitation Services Pager [phone] Office [phone]  [place] 12/24/2019, 3:21 PM

## 2019-12-25 LAB — CULTURE, BLOOD (ROUTINE X 2)
Culture: NO GROWTH
Culture: NO GROWTH
Special Requests: ADEQUATE
Special Requests: ADEQUATE

## 2019-12-25 LAB — GLUCOSE, CAPILLARY
Glucose-Capillary: 106 mg/dL — ABNORMAL HIGH (ref 70–99)
Glucose-Capillary: 121 mg/dL — ABNORMAL HIGH (ref 70–99)
Glucose-Capillary: 121 mg/dL — ABNORMAL HIGH (ref 70–99)
Glucose-Capillary: 133 mg/dL — ABNORMAL HIGH (ref 70–99)
Glucose-Capillary: 139 mg/dL — ABNORMAL HIGH (ref 70–99)
Glucose-Capillary: 147 mg/dL — ABNORMAL HIGH (ref 70–99)
Glucose-Capillary: 88 mg/dL (ref 70–99)

## 2019-12-25 MED ORDER — QUETIAPINE FUMARATE 50 MG PO TABS
100.0000 mg | ORAL_TABLET | Freq: Two times a day (BID) | ORAL | Status: DC
  Administered 2019-12-25 (×2): 100 mg
  Filled 2019-12-25 (×2): qty 2

## 2019-12-25 MED ORDER — DEXMEDETOMIDINE HCL IN NACL 400 MCG/100ML IV SOLN
0.2000 ug/kg/h | INTRAVENOUS | Status: DC
  Administered 2019-12-25: 0.4 ug/kg/h via INTRAVENOUS
  Administered 2019-12-26: 0.7 ug/kg/h via INTRAVENOUS
  Administered 2019-12-26: 0.8 ug/kg/h via INTRAVENOUS
  Administered 2019-12-26 – 2019-12-27 (×2): 0.6 ug/kg/h via INTRAVENOUS
  Administered 2019-12-27: 0.7 ug/kg/h via INTRAVENOUS
  Administered 2019-12-27: 0.8 ug/kg/h via INTRAVENOUS
  Administered 2019-12-28 (×2): 0.6 ug/kg/h via INTRAVENOUS
  Administered 2019-12-28: 0.3 ug/kg/h via INTRAVENOUS
  Filled 2019-12-25 (×10): qty 100

## 2019-12-25 MED ORDER — HALOPERIDOL LACTATE 5 MG/ML IJ SOLN
0.5000 mg | Freq: Once | INTRAMUSCULAR | Status: AC
  Administered 2019-12-25: 0.5 mg via INTRAVENOUS
  Filled 2019-12-25: qty 1

## 2019-12-25 MED ORDER — CLONAZEPAM 0.5 MG PO TABS
0.5000 mg | ORAL_TABLET | Freq: Three times a day (TID) | ORAL | Status: DC
  Administered 2019-12-25 – 2020-01-01 (×22): 0.5 mg
  Filled 2019-12-25 (×22): qty 1

## 2019-12-25 NOTE — Progress Notes (Signed)
Physical Therapy Treatment Patient Details Name: [name] MRN: [id number] DOB: [birth year] Today's Date: 12/25/2019    History of Present Illness pt is a 64 y/o male with pmhx significant for CAD, DM, CABG, ACDF admitted 4/2 with COVID PNA and delirium, intubated 4/5, proned and paralyzed 4/5-4/9, sedated until 4/12, Bronchoscopy for tube change 4/16, 4/19 trached.  5/16 off sedation and on TC.    PT Comments    Pt has started to improve well toward goals though remain severely weak and deconditioned.  Emphasis on transition to EOB, now working on sitting balance with activated trunk muscles and standing.    Follow Up Recommendations  SNF     Equipment Recommendations  Other (comment)(TBA)    Recommendations for Other Services       Precautions / Restrictions Precautions Precautions: Fall Precaution Comments: trach,, large sacral decubitus    Mobility  Bed Mobility Overal bed mobility: Needs Assistance Bed Mobility: Sit to Supine;Supine to Sit     Supine to sit: Max assist;+2 for physical assistance Sit to supine: Max assist;+2 for physical assistance      Transfers Overall transfer level: Needs assistance   Transfers: Sit to/from Stand Sit to Stand: Total assist;+2 physical assistance         General transfer comment: 3 trials of standing, 1st with bilateral support/assist and 2 with face to face assist.  Ambulation/Gait                 Stairs             Wheelchair Mobility    Modified Rankin (Stroke Patients Only)       Balance Overall balance assessment: Needs assistance   Sitting balance-Leahy Scale: Poor Sitting balance - Comments: pt finally starting to activate truncal muscles.  Incoordinated, but with some isolation.  Pt able to attain spinal/cervical extention with minimal assist and initiated extention to cues.  worked for about 10 min and made large gains.     Standing balance-Leahy Scale: Zero Standing balance comment:  needed full/significant trunk and LE support                            Cognition Arousal/Alertness: Awake/alert Behavior During Therapy: Restless;WFL for tasks assessed/performed Overall Cognitive Status: Impaired/Different from baseline                   Orientation Level: Place;Time;Situation;Disoriented to Current Attention Level: Focused   Following Commands: Follows one step commands inconsistently;Follows one step commands with increased time   Awareness: Intellectual          Exercises Other Exercises Other Exercises: AAROM gross flexion, graded extention gross ext bil LE's    General Comments        Pertinent Vitals/Pain Faces Pain Scale: No hurt    Home Living                      Prior Function            PT Goals (current goals can now be found in the care plan section) Acute Rehab PT Goals PT Goal Formulation: Patient unable to participate in goal setting Time For Goal Achievement: 12/30/19 Potential to Achieve Goals: Fair Progress towards PT goals: Progressing toward goals(goals updated, not met yet.  pt starting to progress well)    Frequency    Min 3X/week      PT Plan Current plan remains appropriate;Frequency needs  to be updated    Co-evaluation              AM-PAC PT "6 Clicks" Mobility   Outcome Measure  Help needed turning from your back to your side while in a flat bed without using bedrails?: Total Help needed moving from lying on your back to sitting on the side of a flat bed without using bedrails?: Total Help needed moving to and from a bed to a chair (including a wheelchair)?: Total Help needed standing up from a chair using your arms (e.g., wheelchair or bedside chair)?: Total Help needed to walk in hospital room?: Total Help needed climbing 3-5 steps with a railing? : Total 6 Click Score: 6    End of Session   Activity Tolerance: Patient tolerated treatment well;Patient limited by  fatigue Patient left: in bed;with call bell/phone within reach Nurse Communication: Mobility status PT Visit Diagnosis: Other abnormalities of gait and mobility (R26.89);Muscle weakness (generalized) (M62.81);Difficulty in walking, not elsewhere classified (R26.2)     Time: 4739-5844 PT Time Calculation (min) (ACUTE ONLY): 27 min  Charges:  $Therapeutic Activity: 23-37 mins                     12/25/2019  Ginger Carne., PT Acute Rehabilitation Services [phone]  (pager) [phone]  (office)   Tessie Fass Morio Widen 12/25/2019, 5:38 PM

## 2019-12-25 NOTE — Progress Notes (Signed)
eLink Physician-Brief Progress Note Patient Name: [name] DOB: 1956-03-06 MRN: [id number]   Date of Service  12/25/2019  HPI/Events of Note  Pt with agitated delirium, he is now off Clonidine.  eICU Interventions  Low level Precedex infusion ordered for agitated delirium.        Thomasene Lot Jarris Kortz 12/25/2019, 7:40 PM

## 2019-12-25 NOTE — Progress Notes (Signed)
Referring Physician(s): Briant Sites  Supervising Physician: Gilmer Mor  Patient Status:  [facility] - In-pt  Chief Complaint: Follow up on placement of 20 Fr pull through gastrostomy placement 5/18 by Dr. Deanne Coffer  Subjective:  Patient laying in bed, moving around some, opens eyes but does not follow my commands today. Per RN g-tube was used for free water but meds/feeds are on hold until site checked by IR, abdominal binder was ordered but was too big for patient so they are waiting for an appropriate sized one.   Allergies: Niacin and related  Medications: Prior to Admission medications   Medication Sig Start Date End Date Taking? Authorizing Provider  acetaminophen (TYLENOL) 500 MG tablet Take 500-1,000 mg by mouth every 6 (six) hours as needed for mild pain or headache.   Yes [provider]  albuterol (PROAIR HFA) 108 (90 Base) MCG/ACT inhaler Inhale 2 puffs every 6 hours if needed for breathing Patient taking differently: Inhale 2 puffs into the lungs every 6 (six) hours as needed for wheezing or shortness of breath.  01/04/19  Yes Glenford Bayley, NP  aspirin EC 81 MG tablet Take 81 mg by mouth at bedtime.    Yes [provider]  chlorpheniramine-HYDROcodone (TUSSIONEX PENNKINETIC ER) 10-8 MG/5ML SUER Take 5 mLs by mouth at bedtime as needed. Patient taking differently: Take 5 mLs by mouth at bedtime as needed for cough.  11/05/19  Yes Lorre Munroe, NP  diphenhydrAMINE (BENADRYL) 25 mg capsule Take 1 capsule (25 mg total) by mouth every 8 (eight) hours as needed for itching. Patient taking differently: Take 25 mg by mouth as needed (for severe allergic reactions).  03/12/16  Yes Rhyne, Samantha J, PA-C  EPIPEN 2-PAK 0.3 MG/0.3ML SOAJ injection INJECT ONE SYRINGEFUL INTO THE MUSCLE ONCE AS NEEDED FOR ALLERGIC REACTION Patient taking differently: Inject 0.3 mg into the muscle once as needed for anaphylaxis (or severe allergic reaction).  01/04/19  Yes Glenford Bayley, NP  esomeprazole (NEXIUM) 40 MG capsule Take 1 capsule (40 mg total) by mouth daily at 12 noon. MUST SCHEDULE ANNUAL PHYSICAL Patient taking differently: Take 40 mg by mouth at bedtime.  01/04/19  Yes Rollene Rotunda, MD  ezetimibe (ZETIA) 10 MG tablet Take 1 tablet (10 mg total) by mouth daily. Patient taking differently: Take 10 mg by mouth at bedtime.  01/04/19  Yes Rollene Rotunda, MD  fluticasone (FLONASE) 50 MCG/ACT nasal spray Place 2 sprays into both nostrils at bedtime. 01/04/19  Yes Glenford Bayley, NP  gabapentin (NEURONTIN) 300 MG capsule Take one qAM, one qPM, three qhs Patient taking differently: Take 900 mg by mouth at bedtime.  06/03/19  Yes Hyatt, Max T, DPM  ipratropium-albuterol (DUONEB) 0.5-2.5 (3) MG/3ML SOLN Take 3 mLs by nebulization every 6 (six) hours as needed. Patient taking differently: Take 3 mLs by nebulization every 6 (six) hours as needed (for shortness of breath or wheezing).  01/04/19  Yes Glenford Bayley, NP  loratadine (CLARITIN) 10 MG tablet Take 1 tablet (10 mg total) by mouth daily. Patient taking differently: Take 10 mg by mouth at bedtime.  01/04/19  Yes Glenford Bayley, NP  magnesium oxide (MAG-OX) 400 MG tablet Take 800 mg by mouth at bedtime.    Yes [provider]  metFORMIN (GLUCOPHAGE) 1000 MG tablet TAKE 1 TABLET BY MOUTH TWICE DAILY WITH A MEAL MUST  SCHEDULE  PHYSICAL  EXAM  FOR  MORE  REFILLS Patient taking differently: Take 1,000 mg by  mouth in the morning and at bedtime.  08/15/19  Yes Jearld Fenton, NP  metoprolol succinate (TOPROL-XL) 25 MG 24 hr tablet Take 1 tablet (25 mg total) by mouth at bedtime. 01/04/19  Yes Minus Breeding, MD  nitroGLYCERIN (NITROSTAT) 0.4 MG SL tablet Place 1 tablet (0.4 mg total) under the tongue every 5 (five) minutes as needed for chest pain. 01/04/19 11/14/2019 Yes Minus Breeding, MD  simvastatin (ZOCOR) 40 MG tablet Take 1 tablet (40 mg total) by mouth at bedtime. 01/04/19  Yes Minus Breeding,  MD  traZODone (DESYREL) 100 MG tablet Take 2 tablets (200 mg total) by mouth at bedtime. Patient taking differently: Take 150 mg by mouth at bedtime.  01/04/19  Yes Martyn Ehrich, NP  cyclobenzaprine (FLEXERIL) 10 MG tablet Take 1 tablet (10 mg total) by mouth 3 (three) times daily as needed for muscle spasms. Patient not taking: Reported on 11/26/2019 04/08/19   Tyson Dense T, DPM  HYDROcodone-acetaminophen (NORCO) 5-325 MG tablet Take 1 tablet by mouth every 4 (four) hours as needed for moderate pain. Patient not taking: Reported on 11/10/2019 05/08/19   Duanne Guess, PA-C  sennosides-docusate sodium (SENOKOT-S) 8.6-50 MG tablet Take 2 tablets by mouth daily. Patient not taking: Reported on 11/20/2019 10/20/16   Marchia Bond, MD     Vital Signs: BP 137/69   Pulse 88   Temp 98.2 F (36.8 C) (Oral)   Resp (!) 27   Ht 5\' 8"  (1.727 m)   Wt 169 lb 12.1 oz (77 kg)   SpO2 100%   BMI 25.81 kg/m   Physical Exam Vitals and nursing note reviewed.  Constitutional:      General: He is not in acute distress.    Appearance: He is ill-appearing.  Cardiovascular:     Rate and Rhythm: Normal rate.  Pulmonary:     Comments: (+) trach Abdominal:     General: There is no distension.     Palpations: Abdomen is soft.     Comments: (+) gastrostomy not to suction - tubing intact, insertion site with small amount of dried blood on dressing, no active bleeding or drainage. No erythema or edema. Palpation does not appear to illicit any significant tenderness.  Skin:    General: Skin is warm and dry.  Neurological:     Mental Status: He is alert. Mental status is at baseline.     Imaging: IR GASTROSTOMY TUBE MOD SED  Result Date: 12/24/2019 CLINICAL DATA:  Extended intubation, poor p.o. intake, needs enteral feeding support EXAM: PERC PLACEMENT GASTROSTOMY FLUOROSCOPY TIME:  102 seconds; 10 mGy TECHNIQUE: The procedure, risks, benefits, and alternatives were explained to the spouse. Questions  regarding the procedure were encouraged and answered. The spouse understands and consents to the procedure. The patient was receiving adequate prophylactic antibiotic coverage as an inpatient. A safe percutaneous approach was confirmed on recent CT scan. A 5 French angiographic catheter was placed as orogastric tube. The upper abdomen was prepped with Betadine, draped in usual sterile fashion, and infiltrated locally with 1% lidocaine. Intravenous Fentanyl 187mcg and Versed 2mg  were administered as conscious sedation during continuous monitoring of the patient's level of consciousness and physiological / cardiorespiratory status by the radiology RN, with a total moderate sedation time of 10 minutes. 0.5 mg glucagon given IV to facilitate gastric distention.Stomach was insufflated using air through the orogastric tube. An 32 French sheath needle was advanced percutaneously into the gastric lumen under fluoroscopy. Gas could be aspirated and a small contrast  injection confirmed intraluminal spread. The sheath was exchanged over a guidewire for a 9 [place] vascular sheath, through which the snare device was advanced and used to snare a guidewire passed through the orogastric tube. This was withdrawn, and the snare attached to the 20 French pull-through gastrostomy tube, which was advanced antegrade, positioned with the internal bumper securing the anterior gastric wall to the anterior abdominal wall. Small contrast injection confirms appropriate positioning. The external bumper was applied and the catheter was flushed. COMPLICATIONS: COMPLICATIONS none IMPRESSION: 1. Technically successful 20 French pull-through gastrostomy placement under fluoroscopy. Electronically Signed   By: Corlis Leak M.D.   On: 12/24/2019 13:19    Labs:  CBC: Recent Labs    12/20/19 0424 12/21/19 0449 12/23/19 0506 12/24/19 0717  WBC 16.2* 15.2* 17.8* 19.9*  HGB 9.4* 8.8* 8.7* 8.7*  HCT 30.5* 29.3* 29.3* 28.5*  PLT 362 325 275 271     COAGS: Recent Labs    12/19/19 0438  INR 1.1    BMP: Recent Labs    12/20/19 0424 12/21/19 0449 12/23/19 0506 12/24/19 0336  NA 143 144 144 145  K 3.5 3.6 3.3* 3.7  CL 100 106 102 103  CO2 36* 30 31 31   GLUCOSE 116* 184* 161* 91  BUN 52* 45* 30* 33*  CALCIUM 8.7* 8.5* 8.7* 8.8*  CREATININE 0.51* 0.38* 0.38* 0.33*  GFRNONAA >60 >60 >60 >60  GFRAA >60 >60 >60 >60    LIVER FUNCTION TESTS: Recent Labs    12/09/19 0534 12/10/19 0541 12/13/19 0447 12/14/19 0436  BILITOT 0.4 0.6 0.5 0.6  AST 20 18 18 20   ALT 17 17 14 13   ALKPHOS 61 54 51 57  PROT 5.7* 5.5* 5.6* 6.3*  ALBUMIN 1.6* 1.6* 1.5* 1.7*    Assessment and Plan:  64 y/o M s/p gastrostomy placement 5/18 in IR seen today for site check prior to use. Per RN no issues noted with free water administration, abdominal binder ordered to prevent patient from pulling at tubing.  Insertion site is unremarkable on exam today, no active bleeding or drainage. Depaz use gastrostomy for free water/tube feeds/medications/etc as needed.   Further plans per primary team. Please call IR with any questions or concerns regarding the g-tube.  Electronically Signed: , PA-C 12/25/2019, 11:24 AM   I spent a total of 15 Minutes at the the patient's bedside AND on the patient's hospital floor or unit, greater than 50% of which was counseling/coordinating care for follow up g-tube placement.

## 2019-12-25 NOTE — TOC Progression Note (Signed)
Transition of Care [facility]) - Progression Note    Patient Details  Name: [name] MRN: [id number] Date of Birth: 1956-05-07  [facility] CM/SW Contact  Drake Wuertz, Manfred Arch, RN Phone Number: 12/25/2019, 10:50 AM  Clinical Narrative:    [facility] for [facility] LTACH to be performed by CCM NP today        Expected Discharge Plan and Services                                                 Social Determinants of Health (SDOH) Interventions    Readmission Risk Interventions No flowsheet data found.

## 2019-12-25 NOTE — Progress Notes (Addendum)
NAME:  [name], MRN:  [id number], DOB:  04/18/1956, LOS: 47 ADMISSION DATE:  11/09/19, CONSULTATION DATE:  4/5 REFERRING MD:  Jerral Kalan, CHIEF COMPLAINT:  Dyspnea   Brief History   64 year old man admitted 4/2 with COVID pneumonia and delirium after 1 week of symptoms.  Intubated on 4/5, required prolonged mechanical ventilatory support.  Tracheostomy performed on 4/20  5/17 pending LTAC placement- -this is delayed due to leukocytosis, fevers and some difficult vent weaning  Past Medical History  Laceration of left brachial artery  Hypercholesteremia HX of heart bypass GERD Type 2 diabetes CAD Asthma   Significant Hospital Events   4/19: tachypneic overnight and this am. Unable to wean sedation. Tracheostomy done  4/20: successful trach yesterday but de-recruited and on some escalated settings today. Remains tachypneic this am. Hypoglycemic yesterday and lantus changed 4/22: started on empiric abx but resp cx with abundant gpc. Improving oxygenation requirement but remains with rass -4 despite light sedation.  4/23:mucous plugging overnight and this am with copious/thick secretions.  4/28: issues overnight with increased vent settings. Still without purposeful responses. Fever overnight as well to 101.1. remains minimally responsive and unable to wean iv sedation except for short periods, despite increase in po agents.  4/29: somewhat improved settings overnight on 60/8. Still with temps overnight to 100.7 5/1: Continued improvement in vent requirement 50/8, remains on several sedating medications including continuous fentanyl and precedex. Trach changed to 6.0 shiley XLT. 5/7 tolerating pressure support ventilation while on sedation 5/16 off sedative infusions and on to trach collar. Tolerated x 3 hours 5/17 CPAP overnight. PRN APAP for fevers.  5/18 No issues overnight, placed on ATC this AM and tolerating well   Consults:    Procedures:  04/05 Rt CVC > 4/24 04/16  Bronchoscopy  04/20Tracheostomy 4/24 Rt PICC  Significant Diagnostic Tests:  4/22 CXR: 1. Lines and tubes stable position. 2. Persistent bibasilar pulmonary infiltrates/edema with interim slight clearing from prior exam. 3. Prior CABG. Heart size stable.  4/24 CXR: Slight increase in interstitial and airspace opacities particularly in the lower lobes. Findings suspicious for worsening asymmetric edema or infection.  Head CT 4/27 Mild chronic ischemic white matter disease. No acute intracranial abnormality seen.  Abdominal CT 4/27 > for PEG, trace pneumoperitoneum, extensive airspace disease due to COVID 19, kidney stone left, non-obstructing  MRI brain 5/3 > NAICP, chronic small vessel ischemia, chronic microhemorrhages, stable mild generalized atrophy, ethmoid sinus thickening, bilateral mastoid effusions  Chest CT 5/11 > 1. Pulmonary fibrosis with UIP pattern, new from 2015. 2. There is lower lobe atelectasis. No edema or convincing pneumonia.  Micro Data:  COVID 4/2 >Positive  Blood cultures 4/2 > negative  Respiratory culture 4/5 > Negative  Respiratory culture 4/12 > Normal flora  Respiratory culture 4/21 > Normal flora  BCx 5/15> no growth 2 days Tracheal aspirate 5/14> Rare GPR-- consistent with normal respiratory flora  Sacral wound cx 5/15> Few GPC few GNR   Antimicrobials:  4/21 Vancomycin > 4/23 4/21 Zosyn > 4/26  Interim history/subjective:  RN reports no issues overnight, tolerating transition back to ATC this AM.   Objective   Blood pressure 128/82, pulse 94, temperature 98.2 F (36.8 C), temperature source Oral, resp. rate 18, height 5\' 8"  (1.727 m), weight 77 kg, SpO2 96 %.    Vent Mode: PSV;CPAP FiO2 (%):  [35 %-100 %] 35 % PEEP:  [5 cmH20] 5 cmH20 Pressure Support:  [10 cmH20] 10 cmH20   Intake/Output Summary (Last  24 hours) at 12/25/2019 0856 Last data filed at 12/25/2019 0800 Gross per 24 hour  Intake 512.86 ml  Output 1175 ml  Net  -662.14 ml   Filed Weights   12/23/19 0500 12/24/19 0353 12/25/19 0500  Weight: 74.4 kg 79.3 kg 77 kg    Examination: General: Chronically ill appearing elderly very deconditioned male on ATC  in NAD HEENT: #6 shiley cuffed trach midline, MM pink/moist, PERRL,  Neuro: Alert and interactive this AM, able to follow simple commands, nods appropriately  CV: s1s2 regular rate and rhythm, no murmur, rubs, or gallops,  PULM:  Clear to ascultation bilaterally, no increased work of breathing, tolerating ATC this AM GI: soft, bowel sounds active in all 4 quadrants, non-tender, non-distended, tolerating TF on hold until cleared by IR to use PEG Extremities: warm/dry, no edema  Skin: no rashes or lesions, unstageable sacral pressure ulcer  Resolved Hospital Problem list   COVID-19 pneumonia -Has completed full course of remdesivir and dexamethasone, off isolation 4/23.  HCAP Pneumomediastinum/pneumopericardium Urinary retention  Assessment & Plan:   Acute respiratory failure, VDRF, s/p tracheostomy in setting of COVID-19 PNA, deconditioning Post COVID pulmonary fibrosis  -Pulmonary fibrosis with UIP pattern on chest CT 5/11 -Increased RR during TCT, stimulation, prohibiting successful vent liberation -This is an anxiety or behavioral issue rather than underlying lung disease.  He Farkas be more comfortable off of mechanical ventilation P Remains on clonidine taper and Buspar  Aggressive pulmonary hygiene as able Continue ATC as tolerated  Continue IV antibiotics  Routine trach care   Toxic metabolic encephalopathy related to prolonged sedation, improving  Anxiety -manifesting as increased RR P Start to taper klonopin and seroquel Weaned off fentanyl drip 5/18 Clonidine taper  Continue fentanyl patch  Mobilize as able  PT/OT/SLP therapies    Scaral pseudomonas wound infection -Sacral wound positive for pseudomonas  Unstageable sacral pressure ulcer not POA P Continue  Zosyn Follow cultures WOC consulted  Continue hydrotherapy Frequent turns   Positive Cumulative Fluid Balance  P: Continue to diureses as able  Monitor volume status   Goals of care:  Needs LTAC placement.  Will need to be afebrile + resolution of acute medical problems  Conversation between PCCM in peer-peer 5/16 progress note: "peer to peer with [facility] MD regarding LTAC placement. Despite the fact the patient is weaning , and mental status has improved, they have denied him placement due to fever of 101.1 with increased WBC within the last 24 hours. Per the MD, Medicare rules are clear, and he cannot have any ongoing acute medical issues when being assessed for  admission to LTAC. I have told them we will call for reassessment once fever and WBC have returned to within normal parameters, as he is very appropriate for LTAC placement"  Best practice:  Diet:  EN Pain/Anxiety/Delirium protocol (if indicated): as above VAP protocol (if indicated): yes DVT prophylaxis: lovenox 40mg  daily GI prophylaxis: PPI Glucose control: Lantus+ SSI Mobility: Bed in chair Code Status: full Family Communication:Will update once arrived at bedside  Disposition: ICU, goal LTAC pending improvement of acute issures  Labs   CBC: Recent Labs  Lab 12/18/19 1232 12/20/19 0424 12/21/19 0449 12/23/19 0506 12/24/19 0717  WBC  --  16.2* 15.2* 17.8* 19.9*  NEUTROABS  --   --  7.7 11.0*  --   HGB 9.9* 9.4* 8.8* 8.7* 8.7*  HCT 29.0* 30.5* 29.3* 29.3* 28.5*  MCV  --  93.0 93.9 94.5 94.1  PLT  --  362  325 275 546    Basic Metabolic Panel: Recent Labs  Lab 12/19/19 0438 12/20/19 0424 12/21/19 0449 12/23/19 0506 12/24/19 0336  NA 141 143 144 144 145  K 3.5 3.5 3.6 3.3* 3.7  CL 99 100 106 102 103  CO2 33* 36* 30 31 31   GLUCOSE 115* 116* 184* 161* 91  BUN 39* 52* 45* 30* 33*  CREATININE 0.45* 0.51* 0.38* 0.38* 0.33*  CALCIUM 8.6* 8.7* 8.5* 8.7* 8.8*  MG  --  2.0 2.0  --   --   PHOS  --  5.1*  --    --   --    GFR: Estimated Creatinine Clearance: 89.1 mL/min (A) (by C-G formula based on SCr of 0.33 mg/dL (L)). Recent Labs  Lab 12/20/19 0424 12/21/19 0449 12/23/19 0506 12/24/19 0717  WBC 16.2* 15.2* 17.8* 19.9*    Liver Function Tests: No results for input(s): AST, ALT, ALKPHOS, BILITOT, PROT, ALBUMIN in the last 168 hours. No results for input(s): LIPASE, AMYLASE in the last 168 hours. No results for input(s): AMMONIA in the last 168 hours.  ABG    Component Value Date/Time   PHART 7.449 12/18/2019 1232   PCO2ART 52.5 (H) 12/18/2019 1232   PO2ART 68 (L) 12/18/2019 1232   HCO3 36.4 (H) 12/18/2019 1232   TCO2 38 (H) 12/18/2019 1232   ACIDBASEDEF 1.0 11/11/2019 1529   O2SAT 94.0 12/18/2019 1232     Coagulation Profile: Recent Labs  Lab 12/19/19 0438  INR 1.1    Cardiac Enzymes: No results for input(s): CKTOTAL, CKMB, CKMBINDEX, TROPONINI in the last 168 hours.  HbA1C: Hgb A1c MFr Bld  Date/Time Value Ref Range Status  11/20/2019 06:51 PM 7.5 (H) 4.8 - 5.6 % Final    Comment:    (NOTE) Pre diabetes:          5.7%-6.4% Diabetes:              >6.4% Glycemic control for   <7.0% adults with diabetes   12/19/2018 02:21 PM 7.7 (H) 4.6 - 6.5 % Final    Comment:    Glycemic Control Guidelines for People with Diabetes:Non Diabetic:  <6%Goal of Therapy: <7%Additional Action Suggested:  >8%     CBG: Recent Labs  Lab 12/24/19 1536 12/24/19 2030 12/25/19 0039 12/25/19 0452 12/25/19 0807  GLUCAP 125* 106* 139* 106* 133*      CRITICAL CARE Performed by: Johnsie Cancel  Total critical care time: 32 minutes  Critical care time was exclusive of separately billable procedures and treating other patients. Critical care was necessary to treat or prevent imminent or life-threatening deterioration.  Critical care was time spent personally by me on the following activities: development of treatment plan with patient and/or surrogate as well as nursing,  discussions with consultants, evaluation of patient's response to treatment, examination of patient, obtaining history from patient or surrogate, ordering and performing treatments and interventions, ordering and review of laboratory studies, ordering and review of radiographic studies, pulse oximetry and re-evaluation of patient's condition.  Johnsie Cancel, NP-C  Pulmonary & Critical Care Contact / Pager information can be found on Amion  12/25/2019, 8:56 AM

## 2019-12-25 NOTE — Progress Notes (Signed)
Pt was taken off of vent and placed on 35%/8L trach collar. Pt is tolerating at this time. RT will monitor.

## 2019-12-25 NOTE — Progress Notes (Signed)
Physical Therapy Treatment Patient Details Name: [name] MRN: [id number] DOB: 1955-10-26 Today's Date: 12/25/2019    History of Present Illness pt is a 64 y/o male with pmhx significant for CAD, DM, CABG, ACDF admitted 4/2 with COVID PNA and delirium, intubated 4/5, proned and paralyzed 4/5-4/9, sedated until 4/12, Bronchoscopy for tube change 4/16, 4/19 trached.  5/16 off sedation and on TC.    PT Comments    PT arrived to perform hydrotherapy on sacral wound. Pt up in recliner on entry, however had slid way down in the chair. Recliner placed in fully reclined. Pt total A to scoot up before bringing recliner to fully upright. Pt unable to support himself in upright. Attempted sit>stand but pt unable to clear hips, total A for squat pivot transfer back to bed and for sit>supine. D/c plans remain appropriate. PT will continue to follow acutely.    Follow Up Recommendations  SNF     Equipment Recommendations  Other (comment)(TBA)       Precautions / Restrictions Precautions Precautions: Fall Precaution Comments: trach,, large sacral decubitus Restrictions Weight Bearing Restrictions: No    Mobility  Bed Mobility Overal bed mobility: Needs Assistance Bed Mobility: Sit to Supine       Sit to supine: Total assist;+2 for physical assistance   General bed mobility comments: Pt requires total Ax2 to scoot hips back in bed, and bring LE up and into bed   Transfers Overall transfer level: Needs assistance   Transfers: Squat Pivot Transfers;Sit to/from Stand Sit to Stand: Total assist;+2 physical assistance   Squat pivot transfers: Total assist;+2 physical assistance     General transfer comment: attempted to bring pt into standing from recliner. while pt able to provide intial forward lean pt requires total A for lifting hips off chair, on second attempt pt given total A for squat pivot transfer to chair         Balance Overall balance assessment: Needs  assistance Sitting-balance support: Feet supported;Single extremity supported;No upper extremity supported Sitting balance-Leahy Scale: Poor Sitting balance - Comments: attempts at righting but unable to coordinate to perform sitting        Standing balance comment: not ready yet                            Cognition Arousal/Alertness: Awake/alert Behavior During Therapy: WFL for tasks assessed/performed Overall Cognitive Status: Impaired/Different from baseline Area of Impairment: Following commands;Awareness;Attention;Orientation                 Orientation Level: Place;Time;Situation;Disoriented to Current Attention Level: Focused   Following Commands: Follows one step commands inconsistently;Follows one step commands with increased time   Awareness: Intellectual             General Comments  VSS on trach collar       Pertinent Vitals/Pain Pain Assessment: No/denies pain Faces Pain Scale: No hurt           PT Goals (current goals can now be found in the care plan section) Acute Rehab PT Goals PT Goal Formulation: Patient unable to participate in goal setting Time For Goal Achievement: 12/30/19 Potential to Achieve Goals: Fair Progress towards PT goals: Progressing toward goals    Frequency    Min 3X/week      PT Plan Current plan remains appropriate;Frequency needs to be updated       AM-PAC PT "6 Clicks" Mobility   Outcome Measure  Help needed  turning from your back to your side while in a flat bed without using bedrails?: Total Help needed moving from lying on your back to sitting on the side of a flat bed without using bedrails?: Total Help needed moving to and from a bed to a chair (including a wheelchair)?: Total Help needed standing up from a chair using your arms (e.g., wheelchair or bedside chair)?: Total Help needed to walk in hospital room?: Total Help needed climbing 3-5 steps with a railing? : Total 6 Click Score: 6     End of Session Equipment Utilized During Treatment: Gait belt Activity Tolerance: Patient tolerated treatment well;Patient limited by fatigue Patient left: in bed;with call bell/phone within reach Nurse Communication: Mobility status PT Visit Diagnosis: Other abnormalities of gait and mobility (R26.89);Muscle weakness (generalized) (M62.81);Difficulty in walking, not elsewhere classified (R26.2)     Time: 5625-6389 PT Time Calculation (min) (ACUTE ONLY): 12 min  Charges:  $Therapeutic Activity: 8-22 mins                     Domonique Cothran B. Migdalia Dk PT, DPT Acute Rehabilitation Services Pager [phone] Office [phone]    [place] 12/25/2019, 12:00 PM

## 2019-12-25 NOTE — Progress Notes (Signed)
Physical Therapy Wound Treatment Patient Details  Name: [name] MRN: [id number] Date of Birth: Feb 09, 1956  Today's Date: 12/25/2019 Time: 1218-1258 Time Calculation (min): 40 min  Subjective  Subjective: Pt sitting up in chair, nodded agreement to therapy  Patient and Family Stated Goals: continued progression of mobility  Date of Onset: (unknown) Prior Treatments: moist gauze and santyl   Pain Score:  Pt with facial pain score of 6/10 with treatment today  Wound Assessment  Pressure Injury 12/12/19 Coccyx Mid Unstageable - Full thickness tissue loss in which the base of the injury is covered by slough (yellow, tan, gray, green or brown) and/or eschar (tan, brown or black) in the wound bed. (Active)  Wound Image   12/24/19 1500  Dressing Type ABD;Moist to moist;Other (Comment);Barrier Film (skin prep);Impregnated gauze (petrolatum) 12/25/19 1400  Dressing Other (Comment);Changed 12/25/19 1400  Dressing Change Frequency Daily 12/25/19 1400  State of Healing Eschar 12/25/19 1400  Site / Wound Assessment Black;Red;Yellow;Brown 12/25/19 1400  % Wound base Red or Granulating 15% 12/25/19 1400  % Wound base Yellow/Fibrinous Exudate 85% 12/25/19 1400  % Wound base Black/Eschar 90% 12/16/19 1000  Peri-wound Assessment Erythema (blanchable) 12/25/19 1400  Wound Length (cm) 9.2 cm 12/25/19 1400  Wound Width (cm) 9.5 cm 12/25/19 1400  Wound Depth (cm) 1.4 cm 12/25/19 1400  Wound Surface Area (cm^2) 87.4 cm^2 12/25/19 1400  Wound Volume (cm^3) 122.36 cm^3 12/25/19 1400  Margins Unattached edges (unapproximated) 12/25/19 1400  Drainage Amount Moderate 12/25/19 1400  Drainage Description Serosanguineous 12/25/19 1400  Treatment Debridement (Selective);Hydrotherapy (Pulse lavage);Packing (Saline gauze);Other (Comment) 12/25/19 1400  Santyl to necrotic tissue Hydrotherapy Pulsed lavage therapy - wound location: sacral wound Pulsed Lavage with Suction (psi): 12 psi Pulsed Lavage with  Suction - Normal Saline Used: 1000 mL Pulsed Lavage Tip: Tip with splash shield Selective Debridement Selective Debridement - Location: sacral wound Selective Debridement - Tools Used: Forceps;Scissors Selective Debridement - Tissue Removed: hard eschar    Wound Assessment and Plan  Wound Therapy - Assess/Plan/Recommendations Wound Therapy - Clinical Statement: Pt with male purwick on today which seems to be holding urine better, pt's sacral area is less red and mascerated today. Periwound also looks improved today with less bleeding with dressing removal. Pt wound continues to progress with hydrotherapy and granulation tissue continues to proliferate.  Wound Therapy - Functional Problem List: sedated, and immobile Factors Delaying/Impairing Wound Healing: Immobility;Incontinence;Multiple medical problems Hydrotherapy Plan: Debridement;Pulsatile lavage with suction;Patient/family education Wound Therapy - Frequency: 6X / week Wound Therapy - Follow Up Recommendations: Skilled nursing facility Wound Plan: see above  Wound Therapy Goals- Improve the function of patient's integumentary system by progressing the wound(s) through the phases of wound healing (inflammation - proliferation - remodeling) by: Decrease Necrotic Tissue to: 5 Decrease Necrotic Tissue - Progress: Progressing toward goal Increase Granulation Tissue to: 95 Increase Granulation Tissue - Progress: Progressing toward goal Decrease Length/Width/Depth by (cm): 10% Decrease Length/Width/Depth - Progress: Progressing toward goal Goals/treatment plan/discharge plan were made with and agreed upon by patient/family: No, Patient unable to participate in goals/treatment/discharge plan and family unavailable Time For Goal Achievement: 7 days Wound Therapy - Potential for Goals: Good  Goals will be updated until maximal potential achieved or discharge criteria met.  Discharge criteria: when goals achieved, discharge from hospital, MD  decision/surgical intervention, no progress towards goals, refusal/missing three consecutive treatments without notification or medical reason.  GP    Dani Gobble. Migdalia Dk PT, DPT Acute Rehabilitation Services Pager [phone] Office [phone]  [place] 12/25/2019, 2:13 PM

## 2019-12-25 NOTE — Progress Notes (Signed)
eLink Physician-Brief Progress Note Patient Name: [name] DOB: 04-Nov-1955 MRN: [id number]   Date of Service  12/25/2019  HPI/Events of Note  Pt with agitated delirium despite multiple psychotropic medications.  eICU Interventions  Haldol 0.5 mg iv x 1        Seiya Silsby U Chrishelle Zito 12/25/2019, 1:09 AM

## 2019-12-26 ENCOUNTER — Inpatient Hospital Stay (HOSPITAL_COMMUNITY): Payer: Medicare Other

## 2019-12-26 ENCOUNTER — Other Ambulatory Visit (HOSPITAL_COMMUNITY): Payer: Medicare Other

## 2019-12-26 DIAGNOSIS — I428 Other cardiomyopathies: Secondary | ICD-10-CM

## 2019-12-26 LAB — GLUCOSE, CAPILLARY
Glucose-Capillary: 108 mg/dL — ABNORMAL HIGH (ref 70–99)
Glucose-Capillary: 130 mg/dL — ABNORMAL HIGH (ref 70–99)
Glucose-Capillary: 133 mg/dL — ABNORMAL HIGH (ref 70–99)
Glucose-Capillary: 137 mg/dL — ABNORMAL HIGH (ref 70–99)
Glucose-Capillary: 137 mg/dL — ABNORMAL HIGH (ref 70–99)
Glucose-Capillary: 159 mg/dL — ABNORMAL HIGH (ref 70–99)

## 2019-12-26 LAB — CULTURE, BLOOD (ROUTINE X 2)
Culture: NO GROWTH
Culture: NO GROWTH
Special Requests: ADEQUATE
Special Requests: ADEQUATE

## 2019-12-26 LAB — CULTURE, RESPIRATORY W GRAM STAIN: Culture: NORMAL

## 2019-12-26 LAB — BASIC METABOLIC PANEL
Anion gap: 11 (ref 5–15)
BUN: 30 mg/dL — ABNORMAL HIGH (ref 8–23)
CO2: 31 mmol/L (ref 22–32)
Calcium: 8.8 mg/dL — ABNORMAL LOW (ref 8.9–10.3)
Chloride: 104 mmol/L (ref 98–111)
Creatinine, Ser: 0.48 mg/dL — ABNORMAL LOW (ref 0.61–1.24)
GFR calc Af Amer: >60 mL/min (ref >60)
GFR calc non Af Amer: >60 mL/min (ref >60)
Glucose, Bld: 128 mg/dL — ABNORMAL HIGH (ref 70–99)
Potassium: 3 mmol/L — ABNORMAL LOW (ref 3.5–5.1)
Sodium: 146 mmol/L — ABNORMAL HIGH (ref 135–145)

## 2019-12-26 LAB — CBC
HCT: 29.6 % — ABNORMAL LOW (ref 39.0–52.0)
Hemoglobin: 8.8 g/dL — ABNORMAL LOW (ref 13.0–17.0)
MCH: 27.5 pg (ref 26.0–34.0)
MCHC: 29.7 g/dL — ABNORMAL LOW (ref 30.0–36.0)
MCV: 92.5 fL (ref 80.0–100.0)
Platelets: 290 10*3/uL (ref 150–400)
RBC: 3.2 MIL/uL — ABNORMAL LOW (ref 4.22–5.81)
RDW: 14.9 % (ref 11.5–15.5)
WBC: 20.1 10*3/uL — ABNORMAL HIGH (ref 4.0–10.5)
nRBC: 0 % (ref 0.0–0.2)

## 2019-12-26 MED ORDER — QUETIAPINE FUMARATE 25 MG PO TABS
100.0000 mg | ORAL_TABLET | Freq: Every day | ORAL | Status: DC
  Administered 2019-12-26 – 2020-01-07 (×13): 100 mg
  Filled 2019-12-26: qty 4
  Filled 2019-12-26 (×5): qty 2
  Filled 2019-12-26 (×3): qty 4
  Filled 2019-12-26: qty 2
  Filled 2019-12-26 (×2): qty 4
  Filled 2019-12-26: qty 2
  Filled 2019-12-26: qty 4

## 2019-12-26 MED ORDER — POTASSIUM CHLORIDE 20 MEQ/15ML (10%) PO SOLN
20.0000 meq | ORAL | Status: AC
  Administered 2019-12-26 (×2): 20 meq
  Filled 2019-12-26 (×2): qty 15

## 2019-12-26 MED ORDER — SODIUM CHLORIDE 0.9 % IV BOLUS
250.0000 mL | Freq: Once | INTRAVENOUS | Status: AC
  Administered 2019-12-26: 250 mL via INTRAVENOUS

## 2019-12-26 MED ORDER — QUETIAPINE FUMARATE 50 MG PO TABS
200.0000 mg | ORAL_TABLET | Freq: Every day | ORAL | Status: DC
  Administered 2019-12-26 – 2019-12-28 (×3): 200 mg via ORAL
  Filled 2019-12-26 (×3): qty 4

## 2019-12-26 MED ORDER — POTASSIUM CHLORIDE 10 MEQ/50ML IV SOLN
10.0000 meq | INTRAVENOUS | Status: AC
  Administered 2019-12-26 (×4): 10 meq via INTRAVENOUS
  Filled 2019-12-26 (×4): qty 50

## 2019-12-26 MED ORDER — FUROSEMIDE 40 MG PO TABS
40.0000 mg | ORAL_TABLET | Freq: Two times a day (BID) | ORAL | Status: DC
  Administered 2019-12-26 – 2020-01-01 (×12): 40 mg
  Filled 2019-12-26 (×12): qty 1

## 2019-12-26 NOTE — Progress Notes (Addendum)
  Speech Language Pathology Treatment: [name] Speaking valve  Patient Details Name: [name] MRN: [id number] DOB: 03/04/1956 Today's Date: 12/26/2019 Time: 1100-1120 SLP Time Calculation (min) (ACUTE ONLY): 20 min  Assessment / Plan / Recommendation Clinical Impression  Skilled intervention using PMV (on trach collar) to faciliate verbal communication/secretion management and access to upper airway/oropharynx. Pt currently managing secretions effectively without congestion or cough following cuff deflation. Cognitively [name] had adequate alertness with indications of reduced attention and delayed information processing during session following long complicated hospital course including Covid dx and long term sedation/ventilation. Verbal feedback with demonstrated provided to increase coordination of respiratory support, exhalation and phonation. Dysarthric speech yielded approximately 30% intelligibility encompassing reduced volume and articulatory movement however stimulable at times. He requested "a snack". RR, HR and SpO2 were within normal limits. Pt should continue to slowly improve with time and interventions with ST.   HPI HPI: 64 year old man admitted 4/2 with COVID pneumonia and delirium after 1 week of symptoms.  Intubated on 4/5, required prolonged mechanical ventilatory support.  Tracheostomy performed on 4/20. Transitioning to trach collar trials 5/18. Pending PEG placement for d/c to [facility].      SLP Plan  Continue with current plan of care       Recommendations         Patient [name] use Passy-Muir Speech Valve: During all therapies with supervision PMSV Supervision: Full MD: Please consider changing trach tube to : Smaller size;Cuffless         Oral Care Recommendations: Oral care QID Follow up Recommendations: LTACH SLP Visit Diagnosis: Dysphagia, unspecified (R13.10) Plan: Continue with current plan of care                       [name] 12/26/2019, 11:47 AM  [name].Ed Nurse, children's [phone] Office [phone]
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
## 2019-12-26 NOTE — Progress Notes (Signed)
eLink Physician-Brief Progress Note Patient Name: Nicklos Gaxiola Longbottom DOB: [birth year] MRN: [id number]   Date of Service  12/26/2019  HPI/Events of Note  Urinary incontinence with deep sacral wound - Request for Foley catheter.   eICU Interventions  Place Foley catheter.      Intervention Category Major Interventions: Other:  Lenell Antu 12/26/2019, 10:48 PM

## 2019-12-26 NOTE — Progress Notes (Signed)
  Echocardiogram 2D Echocardiogram has been performed.  Delcie Roch 12/26/2019, 6:10 PM

## 2019-12-26 NOTE — Progress Notes (Signed)
K-3.0. Replaced per elink electrolyte protocol.

## 2019-12-26 NOTE — Progress Notes (Signed)
Physical Therapy Wound Treatment Patient Details  Name: [name] MRN: [id number] Date of Birth: 1955-12-26  Today's Date: 12/26/2019 Time: 0921-1010 Time Calculation (min): 49 min  Subjective  Subjective: Pt nodding in agreement to hydrotherapy Patient and Family Stated Goals: continued progression of mobility  Date of Onset: (unknown) Prior Treatments: moist gauze and santyl   Pain Score:  6/10 with pulsatile lavage and debridement  Wound Assessment  Pressure Injury 12/12/19 Coccyx Mid Unstageable - Full thickness tissue loss in which the base of the injury is covered by slough (yellow, tan, gray, green or brown) and/or eschar (tan, brown or black) in the wound bed. (Active)  Dressing Type ABD;Moist to moist;Other (Comment);Barrier Film (skin prep);Impregnated gauze (petrolatum) 12/26/19 1320  Dressing Other (Comment);Changed 12/26/19 1320  Dressing Change Frequency Daily 12/26/19 1320  State of Healing Eschar 12/26/19 1320  Site / Wound Assessment Black;Red;Yellow;Brown 12/26/19 1320  % Wound base Red or Granulating 15% 12/26/19 1320  % Wound base Yellow/Fibrinous Exudate 85% 12/26/19 1320  % Wound base Black/Eschar 90% 12/16/19 1000  Peri-wound Assessment Erythema (blanchable) 12/26/19 1320  Wound Length (cm) 9.2 cm 12/25/19 1400  Wound Width (cm) 9.5 cm 12/25/19 1400  Wound Depth (cm) 1.4 cm 12/25/19 1400  Wound Surface Area (cm^2) 87.4 cm^2 12/25/19 1400  Wound Volume (cm^3) 122.36 cm^3 12/25/19 1400  Margins Unattached edges (unapproximated) 12/26/19 1320  Drainage Amount Moderate 12/26/19 1320  Drainage Description Serosanguineous 12/26/19 1320  Treatment Debridement (Selective);Hydrotherapy (Pulse lavage);Packing (Saline gauze) 12/26/19 1320  Santyl applied to wound bed prior to applying dressing.   Hydrotherapy Pulsed lavage therapy - wound location: sacral wound Pulsed Lavage with Suction (psi): 12 psi Pulsed Lavage with Suction - Normal Saline Used: 1000  mL Pulsed Lavage Tip: Tip with splash shield Selective Debridement Selective Debridement - Location: sacral wound Selective Debridement - Tools Used: Forceps;Scissors Selective Debridement - Tissue Removed: eschar   Wound Assessment and Plan  Wound Therapy - Assess/Plan/Recommendations Wound Therapy - Clinical Statement: Areas of soft eschar appearing and able to continue debridement. Periwound continues to improve with decreased bleeding with dressing removal. Pt wound continues to progress with hydrotherapy and granulation tissue continues to proliferate.  Wound Therapy - Functional Problem List: sedated, and immobile Factors Delaying/Impairing Wound Healing: Immobility;Incontinence;Multiple medical problems Hydrotherapy Plan: Debridement;Pulsatile lavage with suction;Patient/family education Wound Therapy - Frequency: 6X / week Wound Therapy - Follow Up Recommendations: Skilled nursing facility Wound Plan: see above  Wound Therapy Goals- Improve the function of patient's integumentary system by progressing the wound(s) through the phases of wound healing (inflammation - proliferation - remodeling) by: Decrease Necrotic Tissue to: 5 Decrease Necrotic Tissue - Progress: Progressing toward goal Increase Granulation Tissue to: 95 Increase Granulation Tissue - Progress: Progressing toward goal Decrease Length/Width/Depth by (cm): 10% Decrease Length/Width/Depth - Progress: Progressing toward goal Goals/treatment plan/discharge plan were made with and agreed upon by patient/family: No, Patient unable to participate in goals/treatment/discharge plan and family unavailable Time For Goal Achievement: 7 days Wound Therapy - Potential for Goals: Good  Goals will be updated until maximal potential achieved or discharge criteria met.  Discharge criteria: when goals achieved, discharge from hospital, MD decision/surgical intervention, no progress towards goals, refusal/missing three consecutive  treatments without notification or medical reason.  GP      Wyona Almas, PT, DPT Acute Rehabilitation Services Pager [phone] Office [phone]   [name] 12/26/2019, 1:23 PM

## 2019-12-26 NOTE — Progress Notes (Signed)
NAME:  [name], MRN:  [id number], DOB:  [birth year], LOS: 48 ADMISSION DATE:  11/25/2019, CONSULTATION DATE:  4/5 REFERRING MD:  [name], CHIEF COMPLAINT:  Dyspnea   Brief History   64 year old man admitted 4/2 with COVID pneumonia and delirium after 1 week of symptoms.  Intubated on 4/5, required prolonged mechanical ventilatory support.  Tracheostomy performed on 4/20  5/17 pending LTAC placement- -this is delayed due to leukocytosis, fevers and some difficult vent weaning  Past Medical History  Laceration of left brachial artery  Hypercholesteremia HX of heart bypass GERD Type 2 diabetes CAD Asthma   Significant Hospital Events   4/19: tachypneic overnight and this am. Unable to wean sedation. Tracheostomy done  4/20: successful trach yesterday but de-recruited and on some escalated settings today. Remains tachypneic this am. Hypoglycemic yesterday and lantus changed 4/22: started on empiric abx but resp cx with abundant gpc. Improving oxygenation requirement but remains with rass -4 despite light sedation.  4/23:mucous plugging overnight and this am with copious/thick secretions.  4/28: issues overnight with increased vent settings. Still without purposeful responses. Fever overnight as well to 101.1. remains minimally responsive and unable to wean iv sedation except for short periods, despite increase in po agents.  4/29: somewhat improved settings overnight on 60/8. Still with temps overnight to 100.7 5/1: Continued improvement in vent requirement 50/8, remains on several sedating medications including continuous fentanyl and precedex. Trach changed to 6.0 shiley XLT. 5/7 tolerating pressure support ventilation while on sedation 5/16 off sedative infusions and on to trach collar. Tolerated x 3 hours 5/17 CPAP overnight. PRN APAP for fevers.  5/18 No issues overnight, placed on ATC this AM and tolerating well  5/20 Remained on ATC overnight, mild agitation noted overnight  with addition of low dose precedex drip this resolved   Consults:    Procedures:  04/05 Rt CVC > 4/24 04/16 Bronchoscopy  04/20Tracheostomy 4/24 Rt PICC  Significant Diagnostic Tests:  4/22 CXR: 1. Lines and tubes stable position. 2. Persistent bibasilar pulmonary infiltrates/edema with interim slight clearing from prior exam. 3. Prior CABG. Heart size stable.  4/24 CXR: Slight increase in interstitial and airspace opacities particularly in the lower lobes. Findings suspicious for worsening asymmetric edema or infection.  Head CT 4/27 Mild chronic ischemic white matter disease. No acute intracranial abnormality seen.  Abdominal CT 4/27 > for PEG, trace pneumoperitoneum, extensive airspace disease due to COVID 19, kidney stone left, non-obstructing  MRI brain 5/3 > NAICP, chronic small vessel ischemia, chronic microhemorrhages, stable mild generalized atrophy, ethmoid sinus thickening, bilateral mastoid effusions  Chest CT 5/11 > 1. Pulmonary fibrosis with UIP pattern, new from 2015. 2. There is lower lobe atelectasis. No edema or convincing pneumonia.  Micro Data:  COVID 4/2 >Positive  Blood cultures 4/2 > negative  Respiratory culture 4/5 > Negative  Respiratory culture 4/12 > Normal flora  Respiratory culture 4/21 > Normal flora  BCx 5/15> no growth 2 days Tracheal aspirate 5/14> Rare GPR-- consistent with normal respiratory flora  Sacral wound cx 5/15> Few GPC few GNR   Antimicrobials:  4/21 Vancomycin > 4/23 4/21 Zosyn > 4/26  Interim history/subjective:  RN reported agitation and tachycardia overnight that was resolved with low dose precedex drip Remained on ATC overnight  Objective   Blood pressure 135/79, pulse (!) 101, temperature 98.7 F (37.1 C), temperature source Axillary, resp. rate (!) 25, height 5\' 8"  (1.727 m), weight 80.4 kg, SpO2 100 %.    FiO2 (%):  [  35 %-40 %] 40 %   Intake/Output Summary (Last 24 hours) at 12/26/2019 0901 Last  data filed at 12/26/2019 0800 Gross per 24 hour  Intake 2835.65 ml  Output 450 ml  Net 2385.65 ml   Filed Weights   12/24/19 0353 12/25/19 0500 12/26/19 0500  Weight: 79.3 kg 77 kg 80.4 kg    Examination: General: Chronically ill appearing elderly very deconditioned weak male on ATC in NAD HEENT: #6 cuffed shiley trach midline, MM pink/moist, PERRL, sclera non-icteric   Neuro: Alert and interactive, nods and mouths words appropriately, generalized weakness   CV: s1s2 regular rate and rhythm, no murmur, rubs, or gallops,  PULM:  Mild rhonchi in bilateral upper, tolerating ATC well, no increased work of breathing, oxygen saturatins 93-100 on 35% FiO2  GI: soft, bowel sounds active in all 4 quadrants, non-tender, non-distended, tolerating TF Extremities: warm/dry, no edema  Skin: no rashes or lesions, unstageable sacral pressure ulcer   Resolved Hospital Problem list   COVID-19 pneumonia -Has completed full course of remdesivir and dexamethasone, off isolation 4/23.  HCAP Pneumomediastinum/pneumopericardium Urinary retention  Assessment & Plan:   Acute respiratory failure, VDRF, s/p tracheostomy in setting of COVID-19 PNA, deconditioning Post COVID pulmonary fibrosis  -Pulmonary fibrosis with UIP pattern on chest CT 5/11 -Increased RR during TCT, stimulation, prohibiting successful vent liberation -This is an anxiety or behavioral issue rather than underlying lung disease.  He Vroom be more comfortable off of mechanical ventilation P Tolerated ATC overnight for the first time  Continue aggressive pulmonary hygiene  HOB elevated  Routine trach care Maintain ATC   Toxic metabolic encephalopathy related to prolonged sedation, improving  Anxiety -manifesting as increased RR P Appears to be having increased agitation overnight  Required low dose Precedex drip overnight  Continue fentanyl patch  Klonopin and Seroquel tapered 5/19 Clonidine taper completed 5/19 Mobilize as able   PT/OT/SLP therapies  Scaral pseudomonas wound infection -Sacral wound positive for pseudomonas  Unstageable sacral pressure ulcer not POA P Remains on zosyn Follow culture  WOC consulted and following Continue hydrotherapy  Frequent turn   Positive Cumulative Fluid Balance  P: Increase PO lasix to BID Monitor volume status   Goals of care:  Needs LTAC placement. Attempted to complete second peer to peer 5/19 but in conversation with [facility] I was informed that the approval process foe the LTAC placement would need to be resubmitted or an appeal would need to be made. Given significant improvement including no vent support at night will hold on further LTAC discussion for now, patient my be able to discharge to SNF.    Best practice:  Diet:  EN Pain/Anxiety/Delirium protocol (if indicated): as above VAP protocol (if indicated): yes DVT prophylaxis: lovenox 40mg  daily GI prophylaxis: PPI Glucose control: Lantus+ SSI Mobility: Bed in chair Code Status: full Family Communication :Will update once arrived at bedside  Disposition: ICU possible transfer to progressive care soon   Labs   CBC: Recent Labs  Lab 12/20/19 0424 12/21/19 0449 12/23/19 0506 12/24/19 0717 12/26/19 0433  WBC 16.2* 15.2* 17.8* 19.9* 20.1*  NEUTROABS  --  7.7 11.0*  --   --   HGB 9.4* 8.8* 8.7* 8.7* 8.8*  HCT 30.5* 29.3* 29.3* 28.5* 29.6*  MCV 93.0 93.9 94.5 94.1 92.5  PLT 362 325 275 271 290    Basic Metabolic Panel: Recent Labs  Lab 12/20/19 0424 12/21/19 0449 12/23/19 0506 12/24/19 0336 12/26/19 0433  NA 143 144 144 145 146*  K 3.5 3.6  3.3* 3.7 3.0*  CL 100 106 102 103 104  CO2 36* 30 31 31 31   GLUCOSE 116* 184* 161* 91 128*  BUN 52* 45* 30* 33* 30*  CREATININE 0.51* 0.38* 0.38* 0.33* 0.48*  CALCIUM 8.7* 8.5* 8.7* 8.8* 8.8*  MG 2.0 2.0  --   --   --   PHOS 5.1*  --   --   --   --    GFR: Estimated Creatinine Clearance: 89.1 mL/min (A) (by C-G formula based on SCr of 0.48 mg/dL  (L)). Recent Labs  Lab 12/21/19 0449 12/23/19 0506 12/24/19 0717 12/26/19 0433  WBC 15.2* 17.8* 19.9* 20.1*    Liver Function Tests: No results for input(s): AST, ALT, ALKPHOS, BILITOT, PROT, ALBUMIN in the last 168 hours. No results for input(s): LIPASE, AMYLASE in the last 168 hours. No results for input(s): AMMONIA in the last 168 hours.  ABG    Component Value Date/Time   PHART 7.449 12/18/2019 1232   PCO2ART 52.5 (H) 12/18/2019 1232   PO2ART 68 (L) 12/18/2019 1232   HCO3 36.4 (H) 12/18/2019 1232   TCO2 38 (H) 12/18/2019 1232   ACIDBASEDEF 1.0 11/11/2019 1529   O2SAT 94.0 12/18/2019 1232     Coagulation Profile: No results for input(s): INR, PROTIME in the last 168 hours.  Cardiac Enzymes: No results for input(s): CKTOTAL, CKMB, CKMBINDEX, TROPONINI in the last 168 hours.  HbA1C: Hgb A1c MFr Bld  Date/Time Value Ref Range Status  11/30/2019 06:51 PM 7.5 (H) 4.8 - 5.6 % Final    Comment:    (NOTE) Pre diabetes:          5.7%-6.4% Diabetes:              >6.4% Glycemic control for   <7.0% adults with diabetes   12/19/2018 02:21 PM 7.7 (H) 4.6 - 6.5 % Final    Comment:    Glycemic Control Guidelines for People with Diabetes:Non Diabetic:  <6%Goal of Therapy: <7%Additional Action Suggested:  >8%     CBG: Recent Labs  Lab 12/25/19 1610 12/25/19 2004 12/25/19 2337 12/26/19 0344 12/26/19 0802  GLUCAP 121* 147* 88 137* 137*      CRITICAL CARE Performed by: Johnsie Cancel  Total critical care time: 36 minutes  Critical care time was exclusive of separately billable procedures and treating other patients. Critical care was necessary to treat or prevent imminent or life-threatening deterioration.  Critical care was time spent personally by me on the following activities: development of treatment plan with patient and/or surrogate as well as nursing, discussions with consultants, evaluation of patient's response to treatment, examination of patient,  obtaining history from patient or surrogate, ordering and performing treatments and interventions, ordering and review of laboratory studies, ordering and review of radiographic studies, pulse oximetry and re-evaluation of patient's condition.  Johnsie Cancel, NP-C [facility] Contact / Pager information can be found on Amion  12/26/2019, 9:01 AM

## 2019-12-26 NOTE — Progress Notes (Signed)
eLink Physician-Brief Progress Note Patient Name: [name] DOB: 09/18/1955 MRN: [id number]   Date of Service  12/26/2019  HPI/Events of Note  Pt with persistent narrow complex tachycardia with rate of 142, proir EKG was sinus tachycardia.  eICU Interventions  NS 250 ml iv fluid bolus challenge to r/o volume depletion, 12 lead EKG to confirm ST, ECHO  this a.m. to r/o underlying cardiomyopathy since he does not have a baseline ECHO.        Thomasene Lot Amarise Lillo 12/26/2019, 5:44 AM

## 2019-12-27 ENCOUNTER — Telehealth: Payer: Self-pay | Admitting: Pulmonary Disease

## 2019-12-27 LAB — CBC
HCT: 30.6 % — ABNORMAL LOW (ref 39.0–52.0)
HCT: 30.4 % — ABNORMAL LOW (ref 39.0–52.0)
Hemoglobin: 9.1 g/dL — ABNORMAL LOW (ref 13.0–17.0)
Hemoglobin: 9.2 g/dL — ABNORMAL LOW (ref 13.0–17.0)
MCH: 27.6 pg (ref 26.0–34.0)
MCH: 28 pg (ref 26.0–34.0)
MCHC: 29.7 g/dL — ABNORMAL LOW (ref 30.0–36.0)
MCHC: 30.3 g/dL (ref 30.0–36.0)
MCV: 92.7 fL (ref 80.0–100.0)
MCV: 92.7 fL (ref 80.0–100.0)
Platelets: 305 10*3/uL (ref 150–400)
Platelets: 316 10*3/uL (ref 150–400)
RBC: 3.3 MIL/uL — ABNORMAL LOW (ref 4.22–5.81)
RBC: 3.28 MIL/uL — ABNORMAL LOW (ref 4.22–5.81)
RDW: 15.2 % (ref 11.5–15.5)
RDW: 14.9 % (ref 11.5–15.5)
WBC: 18.3 10*3/uL — ABNORMAL HIGH (ref 4.0–10.5)
WBC: 19.3 10*3/uL — ABNORMAL HIGH (ref 4.0–10.5)
nRBC: 0 % (ref 0.0–0.2)
nRBC: 0 % (ref 0.0–0.2)

## 2019-12-27 LAB — GLUCOSE, CAPILLARY
Glucose-Capillary: 104 mg/dL — ABNORMAL HIGH (ref 70–99)
Glucose-Capillary: 120 mg/dL — ABNORMAL HIGH (ref 70–99)
Glucose-Capillary: 122 mg/dL — ABNORMAL HIGH (ref 70–99)
Glucose-Capillary: 135 mg/dL — ABNORMAL HIGH (ref 70–99)
Glucose-Capillary: 148 mg/dL — ABNORMAL HIGH (ref 70–99)
Glucose-Capillary: 173 mg/dL — ABNORMAL HIGH (ref 70–99)

## 2019-12-27 LAB — BASIC METABOLIC PANEL
Anion gap: 9 (ref 5–15)
BUN: 32 mg/dL — ABNORMAL HIGH (ref 8–23)
CO2: 30 mmol/L (ref 22–32)
Calcium: 9 mg/dL (ref 8.9–10.3)
Chloride: 105 mmol/L (ref 98–111)
Creatinine, Ser: 0.49 mg/dL — ABNORMAL LOW (ref 0.61–1.24)
GFR calc Af Amer: >60 mL/min (ref >60)
GFR calc non Af Amer: >60 mL/min (ref >60)
Glucose, Bld: 178 mg/dL — ABNORMAL HIGH (ref 70–99)
Potassium: 3.5 mmol/L (ref 3.5–5.1)
Sodium: 144 mmol/L (ref 135–145)

## 2019-12-27 LAB — ECHOCARDIOGRAM COMPLETE
Height: 68 in
Weight: 2836 oz

## 2019-12-27 MED ORDER — CLONIDINE HCL 0.2 MG PO TABS
0.1000 mg | ORAL_TABLET | Freq: Four times a day (QID) | ORAL | Status: AC
  Administered 2019-12-27 – 2019-12-29 (×11): 0.1 mg
  Filled 2019-12-27 (×11): qty 1

## 2019-12-27 NOTE — Telephone Encounter (Signed)
Spoke with [employer]. Was advised that the denial was for long term acute care. Pt is currently admitted right now. Will route to Dr. Isaiah Serge has he is taking care of the pt in the hospital.

## 2019-12-27 NOTE — Evaluation (Signed)
Occupational Therapy Evaluation Patient Details Name: [name] MRN: [id number] DOB: 05/24/1956 Today's Date: 12/27/2019    History of Present Illness pt is a 64 y/o male with pmhx significant for CAD, DM, CABG, ACDF admitted 4/2 with COVID PNA and delirium, intubated 4/5, proned and paralyzed 4/5-4/9, sedated until 4/12, Bronchoscopy for tube change 4/16, 4/19 trached.  5/16 off sedation and on TC.   Clinical Impression   This 64 yo male admitted with above with extensive medical history since admission presents to acute OT following ~10 % of commands, moves all 4 extremities spontaneously and some to command, has decreased sitting (mod A) and standing balance (total A +2) all affecting his safety and independence with basic ADLs as pta he was independent with basic ADLs. He will benefit from acute OT with follow up at [facility].    Follow Up Recommendations  SNF(v LTACH)    Equipment Recommendations  Other (comment)(TBD next venue)       Precautions / Restrictions Precautions Precautions: Fall Precaution Comments: trach,, large sacral decubitus Restrictions Weight Bearing Restrictions: No      Mobility Bed Mobility Overal bed mobility: Needs Assistance       Supine to sit: Max assist;+2 for physical assistance     General bed mobility comments: up via L elbow, total to scoot to EOB  Transfers Overall transfer level: Needs assistance Equipment used: 2 person hand held assist Transfers: Sit to/from [employer] Sit to Stand: Total assist;+2 physical assistance   Squat pivot transfers: Total assist;+2 physical assistance     General transfer comment: Noticed pt giving effort to assist today.    Balance Overall balance assessment: Needs assistance Sitting-balance support: Feet supported;Bilateral upper extremity supported;Single extremity supported Sitting balance-Leahy Scale: Poor Sitting balance - Comments: pt's trunk now active with small  improvements daily.  Some co contraction with abdominals more active than paraspinals.  Pt needed min to mod assist to attain full cervical extension and able to hold for about 3 secs with gradual flexion.     Standing balance-Leahy Scale: Zero Standing balance comment: needed full/significant trunk and LE support                           ADL either performed or assessed with clinical judgement   ADL Overall ADL's : Needs assistance/impaired                                       General ADL Comments: Total A due to cognition, decreased mobility, and decreased ROM extremities x4     Vision   Vision Assessment?: Vision impaired- to be further tested in functional context            Pertinent Vitals/Pain Pain Assessment: Faces Faces Pain Scale: Hurts little more Pain Location: buttock Pain Descriptors / Indicators: Discomfort;Grimacing(squimring) Pain Intervention(s): Limited activity within patient's tolerance;Repositioned     Hand Dominance Right   Extremity/Trunk Assessment Upper Extremity Assessment Upper Extremity Assessment: LUE deficits/detail;RUE deficits/detail RUE Deficits / Details: Decreased A/PROM at shoulder, rest 2-3/5 RUE Coordination: decreased fine motor;decreased gross motor LUE Deficits / Details: Decreased A/PROM at shoulder, rest 1-2/5 LUE Coordination: decreased fine motor;decreased gross motor           Communication Communication Communication: No difficulties   Cognition Arousal/Alertness: Awake/alert Behavior During Therapy: Restless Overall Cognitive Status: Impaired/Different from baseline Area of  Impairment: Orientation;Attention;Following commands;Safety/judgement;Awareness;Problem solving                 Orientation Level: Place;Time;Situation;Disoriented to Current Attention Level: Focused   Following Commands: Follows one step commands inconsistently;Follows one step commands with increased  time Safety/Judgement: Decreased awareness of safety;Decreased awareness of deficits Awareness: Intellectual Problem Solving: Slow processing;Decreased initiation;Difficulty sequencing;Requires verbal cues;Requires tactile cues                Home Living Family/patient expects to be discharged to:: Skilled nursing facility Living Arrangements: Spouse/significant other;Children;Other (Comment)(one son still lives at home) Available Help at Discharge: Family;Available PRN/intermittently;Available 24 hours/day(wife and on work, but can take FMLA) Type of Home: Mobile home Home Access: Stairs to enter   Entrance Stairs-Rails: Right;Left Home Layout: One level     Bathroom Shower/Tub: Chief Strategy Officer: Standard     Home Equipment: None          Prior Functioning/Environment Level of Independence: Independent                 OT Problem List: Decreased strength;Decreased range of motion;Impaired balance (sitting and/or standing);Impaired vision/perception;Impaired UE functional use;Pain;Decreased safety awareness;Decreased cognition;Decreased coordination      OT Treatment/Interventions: Self-care/ADL training;DME and/or AE instruction;Patient/family education;Balance training;Therapeutic exercise;Therapeutic activities;Visual/perceptual remediation/compensation    OT Goals(Current goals can be found in the care plan section) Acute Rehab OT Goals Patient Stated Goal: pt unable OT Goal Formulation: Patient unable to participate in goal setting Time For Goal Achievement: 01/10/20 Potential to Achieve Goals: Fair  OT Frequency: Min 2X/week           Co-evaluation PT/OT/SLP Co-Evaluation/Treatment: Yes Reason for Co-Treatment: Complexity of the patient's impairments (multi-system involvement);For patient/therapist safety;To address functional/ADL transfers          AM-PAC OT "6 Clicks" Daily Activity     Outcome Measure Help from another person  eating meals?: Total(NPO) Help from another person taking care of personal grooming?: Total Help from another person toileting, which includes using toliet, bedpan, or urinal?: Total Help from another person bathing (including washing, rinsing, drying)?: Total Help from another person to put on and taking off regular upper body clothing?: Total Help from another person to put on and taking off regular lower body clothing?: Total 6 Click Score: 6   End of Session Nurse Communication: Mobility status(lift pad under patient')  Activity Tolerance: Patient tolerated treatment well Patient left: in chair;with chair alarm set;with call bell/phone within reach  OT Visit Diagnosis: Other abnormalities of gait and mobility (R26.89);Muscle weakness (generalized) (M62.81);Pain;Other symptoms and signs involving cognitive function;Cognitive communication deficit (R41.841) Pain - part of body: (acrum)                Time: 1120-1150 OT Time Calculation (min): 30 min Charges:  OT General Charges $OT Visit: 1 Visit OT Evaluation $OT Eval Moderate Complexity: 1 Mod  Ignacia Palma, OTR/L Acute [employer] Pager [phone] Office [phone]    Evette Georges 12/27/2019, 3:50 PM

## 2019-12-27 NOTE — Progress Notes (Signed)
NAME:  [name], MRN:  [id number], DOB:  [birth year], LOS: 49 ADMISSION DATE:  2019/11/19, CONSULTATION DATE:  4/5 REFERRING MD:  Jerral Lovis, CHIEF COMPLAINT:  Dyspnea   Brief History   64 year old man admitted 4/2 with COVID pneumonia and delirium after 1 week of symptoms.  Intubated on 4/5, required prolonged mechanical ventilatory support.  Tracheostomy performed on 4/20  5/17 pending LTAC placement- -this is delayed due to leukocytosis, fevers and some difficult vent weaning and anxiety   Past Medical History  Laceration of left brachial artery  Hypercholesteremia HX of heart bypass GERD Type 2 diabetes CAD Asthma   Significant Hospital Events   4/19: tachypneic overnight and this am. Unable to wean sedation. Tracheostomy done  4/20: successful trach yesterday but de-recruited and on some escalated settings today. Remains tachypneic this am. Hypoglycemic yesterday and lantus changed 4/22: started on empiric abx but resp cx with abundant gpc. Improving oxygenation requirement but remains with rass -4 despite light sedation.  4/23:mucous plugging overnight and this am with copious/thick secretions.  4/28: issues overnight with increased vent settings. Still without purposeful responses. Fever overnight as well to 101.1. remains minimally responsive and unable to wean iv sedation except for short periods, despite increase in po agents.  4/29: somewhat improved settings overnight on 60/8. Still with temps overnight to 100.7 5/1: Continued improvement in vent requirement 50/8, remains on several sedating medications including continuous fentanyl and precedex. Trach changed to 6.0 shiley XLT. 5/7 tolerating pressure support ventilation while on sedation 5/16 off sedative infusions and on to trach collar. Tolerated x 3 hours 5/17 CPAP overnight. PRN APAP for fevers.  5/18 No issues overnight, placed on ATC this AM and tolerating well  5/20 Remained on ATC overnight, mild agitation noted  overnight with addition of low dose precedex drip this resolved   Consults:    Procedures:  04/05 Rt CVC > 4/24 04/16 Bronchoscopy  04/20Tracheostomy 4/24 Rt PICC  Significant Diagnostic Tests:  4/22 CXR: 1. Lines and tubes stable position. 2. Persistent bibasilar pulmonary infiltrates/edema with interim slight clearing from prior exam. 3. Prior CABG. Heart size stable.  4/24 CXR: Slight increase in interstitial and airspace opacities particularly in the lower lobes. Findings suspicious for worsening asymmetric edema or infection.  Head CT 4/27 Mild chronic ischemic white matter disease. No acute intracranial abnormality seen.  Abdominal CT 4/27 > for PEG, trace pneumoperitoneum, extensive airspace disease due to COVID 19, kidney stone left, non-obstructing  MRI brain 5/3 > NAICP, chronic small vessel ischemia, chronic microhemorrhages, stable mild generalized atrophy, ethmoid sinus thickening, bilateral mastoid effusions  Chest CT 5/11 > 1. Pulmonary fibrosis with UIP pattern, new from 2015. 2. There is lower lobe atelectasis. No edema or convincing Pneumonia.  ECHO 5/20> LVEF 50% Mildly decreased LV function, without wall motion abnormalities. Grade I diastolic dysfunction. RV systolic function normal, normal pulmonary artery systolic pressure. RVSP 28.2 mmHgf  Micro Data:  COVID 4/2 >Positive  Blood cultures 4/2 > negative  Respiratory culture 4/5 > Negative  Respiratory culture 4/12 > Normal flora  Respiratory culture 4/21 > Normal flora  BCx 5/15> no growth 2 days Tracheal aspirate 5/14> Rare GPR-- consistent with normal respiratory flora  Sacral wound cx 5/15> pseudomonas aeruginosa   Antimicrobials:  4/21 Vancomycin > 4/23 4/21 Zosyn > 4/26, 5/19>>   Interim history/subjective:  Continues on ATC Remains on precedex gtt   Objective   Blood pressure (!) 94/59, pulse 83, temperature 99.4 F (37.4 C), temperature source  Oral, resp. rate (!) 26,  height 5\' 8"  (1.727 m), weight 73.9 kg, SpO2 94 %.    FiO2 (%):  [28 %-40 %] 28 %   Intake/Output Summary (Last 24 hours) at 12/27/2019 0746 Last data filed at 12/27/2019 0600 Gross per 24 hour  Intake 3079.91 ml  Output 4147 ml  Net -1067.09 ml   Filed Weights   12/25/19 0500 12/26/19 0500 12/27/19 0342  Weight: 77 kg 80.4 kg 73.9 kg    Examination: General: Chronically ill appearing older adult M, tract collar, reclined in bed NAD  HEENT: NCAT. #6 cuffed shiley trach secure. Trachea midline. Pink mmm. Anicteric sclera. Neuro: Awake, drowsy. Following commands. PERRLA 61mm  CV: rrr s1s2 no rgm. Cap refill < 3 seconds  PULM: Symmetrical chest expansion, even unlabored respirations on trach collar. Faint scattered rhonchi bilaterally GI: Soft, round, ndnt normoactive x4soft Extremities: Symmetrical muscle bulk and tone. No obvious joint deformity, no cyanosis or clubbing  Skin: clean, dry, warm  Resolved Hospital Problem list   COVID-19 pneumonia -Has completed full course of remdesivir and dexamethasone, off isolation 4/23.  HCAP Pneumomediastinum/pneumopericardium Urinary retention VDRF  Assessment & Plan:   Acute respiratory failure with hypoxia, s/p tracheostomy in setting of COVID-19 PNA, deconditioning Post COVID pulmonary fibrosis  -Pulmonary fibrosis with UIP pattern on chest CT 5/11 -Improving tachypnea (felt to be due to anxiety), which had impeded vent weaning/liberation previously P Continue ATC. Has tolerated > 24 hrs continuously  Routine trach care Cont pulm hygiene  Elevated HOB PRN CXR  Toxic metabolic encephalopathy in setting of prolonged sedation, improving  Anxiety, possible critical illness related ptsd  -anxiety manifesting as increased RR, pt endorses feels of panic, anxiety, sleep disturbance  -completed clonidine taper 5/19, then placed back no precedex  P Adding clonidine with hope to wean off precedex. Slavens need prolonged course of clonidine   Continue fentanyl patch, trazodone, buspar  Klonopin and Seroquel tapered 5/19 Continue efforts at mobility, SLP, OT   Sacral wound with pseudomonas aeruginosa infection  -Sacral wound positive for PSA  Unstageable sacral pressure ulcer not POA P Cont pip/tazo Cont WOC Cont Hydrotherapy Frequent turn   Volume overload -net positive 2.3L  P: BID lasix    Goals of care:  Needs LTAC placement. Attempted to complete second peer to peer 5/19 but in conversation with [facility] I was informed that the approval process for the LTAC placement would need to be resubmitted or an appeal would need to be made. Given significant improvement including no vent support at night will hold on further LTAC discussion for now, patient my be able to discharge to SNF.    Best practice:  Diet:  EN + supervised small sips and ice chips  Pain/Anxiety/Delirium protocol (if indicated): as above VAP protocol (if indicated): yes DVT prophylaxis: lovenox 40mg  daily GI prophylaxis: PPI Glucose control: Lantus+ SSI Mobility: Bed in chair Code Status: full Family Communication :pending 5/21 Disposition: Remains in ICU   Labs   CBC: Recent Labs  Lab 12/21/19 0449 12/23/19 0506 12/24/19 0717 12/26/19 0433 12/27/19 0452  WBC 15.2* 17.8* 19.9* 20.1* 18.3*  NEUTROABS 7.7 11.0*  --   --   --   HGB 8.8* 8.7* 8.7* 8.8* 9.1*  HCT 29.3* 29.3* 28.5* 29.6* 30.6*  MCV 93.9 94.5 94.1 92.5 92.7  PLT 325 275 271 290 010    Basic Metabolic Panel: Recent Labs  Lab 12/21/19 0449 12/23/19 0506 12/24/19 0336 12/26/19 0433 12/27/19 0452  NA 144 144 145  146* 144  K 3.6 3.3* 3.7 3.0* 3.5  CL 106 102 103 104 105  CO2 30 31 31 31 30   GLUCOSE 184* 161* 91 128* 178*  BUN 45* 30* 33* 30* 32*  CREATININE 0.38* 0.38* 0.33* 0.48* 0.49*  CALCIUM 8.5* 8.7* 8.8* 8.8* 9.0  MG 2.0  --   --   --   --    GFR: Estimated Creatinine Clearance: 89.1 mL/min (A) (by C-G formula based on SCr of 0.49 mg/dL (L)). Recent Labs  Lab  12/23/19 0506 12/24/19 0717 12/26/19 0433 12/27/19 0452  WBC 17.8* 19.9* 20.1* 18.3*    Liver Function Tests: No results for input(s): AST, ALT, ALKPHOS, BILITOT, PROT, ALBUMIN in the last 168 hours. No results for input(s): LIPASE, AMYLASE in the last 168 hours. No results for input(s): AMMONIA in the last 168 hours.  ABG    Component Value Date/Time   PHART 7.449 12/18/2019 1232   PCO2ART 52.5 (H) 12/18/2019 1232   PO2ART 68 (L) 12/18/2019 1232   HCO3 36.4 (H) 12/18/2019 1232   TCO2 38 (H) 12/18/2019 1232   ACIDBASEDEF 1.0 11/11/2019 1529   O2SAT 94.0 12/18/2019 1232     Coagulation Profile: No results for input(s): INR, PROTIME in the last 168 hours.  Cardiac Enzymes: No results for input(s): CKTOTAL, CKMB, CKMBINDEX, TROPONINI in the last 168 hours.  HbA1C: Hgb A1c MFr Bld  Date/Time Value Ref Range Status  11-26-2019 06:51 PM 7.5 (H) 4.8 - 5.6 % Final    Comment:    (NOTE) Pre diabetes:          5.7%-6.4% Diabetes:              >6.4% Glycemic control for   <7.0% adults with diabetes   12/19/2018 02:21 PM 7.7 (H) 4.6 - 6.5 % Final    Comment:    Glycemic Control Guidelines for People with Diabetes:Non Diabetic:  <6%Goal of Therapy: <7%Additional Action Suggested:  >8%     CBG: Recent Labs  Lab 12/26/19 1132 12/26/19 1500 12/26/19 1959 12/26/19 2332 12/27/19 0344  GLUCAP 133* 130* 108* 159* 122*      CRITICAL CARE Performed by: 12/29/19   Total critical care time: 40 minutes  Critical care time was exclusive of separately billable procedures and treating other patients. Critical care was necessary to treat or prevent imminent or life-threatening deterioration.  Critical care was time spent personally by me on the following activities: development of treatment plan with patient and/or surrogate as well as nursing, discussions with consultants, evaluation of patient's response to treatment, examination of patient, obtaining history from  patient or surrogate, ordering and performing treatments and interventions, ordering and review of laboratory studies, ordering and review of radiographic studies, pulse oximetry and re-evaluation of patient's condition.  Lanier Clam MSN, AGACNP-BC [place] [facility] Tessie Fass If no answer, [phone] 12/27/2019, 7:46 AM

## 2019-12-27 NOTE — Progress Notes (Signed)
Physical Therapy Wound Treatment Patient Details  Name: [name] MRN: [id number] Date of Birth: [birth year]  Today's Date: 12/27/2019 Time: 0923-1009 Time Calculation (min): 46 min  Subjective  Subjective: Pt alert initially, fell asleep during treatment Patient and Family Stated Goals: continued progression of mobility  Date of Onset: (unknown) Prior Treatments: moist gauze and santyl   Pain Score:  0/10 with premedication  Wound Assessment  Pressure Injury 12/12/19 Coccyx Mid Unstageable - Full thickness tissue loss in which the base of the injury is covered by slough (yellow, tan, gray, green or brown) and/or eschar (tan, brown or black) in the wound bed. (Active)  Dressing Type ABD;Moist to moist;Other (Comment);Barrier Film (skin prep);Impregnated gauze (petrolatum) 12/27/19 1015  Dressing Other (Comment);Changed 12/27/19 1015  Dressing Change Frequency Daily 12/27/19 1015  State of Healing Eschar 12/27/19 1015  Site / Wound Assessment Black;Red;Yellow;Brown 12/27/19 1015  % Wound base Red or Granulating 15% 12/27/19 1015  % Wound base Yellow/Fibrinous Exudate 85% 12/27/19 1015  % Wound base Black/Eschar 90% 12/16/19 1000  Peri-wound Assessment Erythema (blanchable) 12/27/19 1015  Wound Length (cm) 9.2 cm 12/25/19 1400  Wound Width (cm) 9.5 cm 12/25/19 1400  Wound Depth (cm) 1.4 cm 12/25/19 1400  Wound Surface Area (cm^2) 87.4 cm^2 12/25/19 1400  Wound Volume (cm^3) 122.36 cm^3 12/25/19 1400  Margins Unattached edges (unapproximated) 12/27/19 1015  Drainage Amount Moderate 12/27/19 1015  Drainage Description Serosanguineous;Green 12/27/19 1015  Treatment Debridement (Selective);Hydrotherapy (Pulse lavage);Packing (Saline gauze) 12/27/19 1015  Santyl applied to wound bed prior to applying dressing.   Hydrotherapy Pulsed lavage therapy - wound location: sacral wound Pulsed Lavage with Suction (psi): 12 psi Pulsed Lavage with Suction - Normal Saline Used: 1000 mL Pulsed  Lavage Tip: Tip with splash shield Selective Debridement Selective Debridement - Location: sacral wound Selective Debridement - Tools Used: Forceps;Scissors Selective Debridement - Tissue Removed: eschar   Wound Assessment and Plan  Wound Therapy - Assess/Plan/Recommendations Wound Therapy - Clinical Statement: Wound continues to improve, with small areas of red granulation tissue appearing. Continues debridement of necrotic edges, soft eschar, and yellow/fibrinous exudate. Pt wound continues to progress with hydrotherapy and granulation tissue continues to proliferate.  Wound Therapy - Functional Problem List: sedated, and immobile Factors Delaying/Impairing Wound Healing: Immobility;Incontinence;Multiple medical problems Hydrotherapy Plan: Debridement;Pulsatile lavage with suction;Patient/family education Wound Therapy - Frequency: 6X / week Wound Therapy - Follow Up Recommendations: Skilled nursing facility Wound Plan: see above  Wound Therapy Goals- Improve the function of patient's integumentary system by progressing the wound(s) through the phases of wound healing (inflammation - proliferation - remodeling) by: Decrease Necrotic Tissue to: 5 Decrease Necrotic Tissue - Progress: Progressing toward goal Increase Granulation Tissue to: 95 Increase Granulation Tissue - Progress: Progressing toward goal Decrease Length/Width/Depth by (cm): 10% Decrease Length/Width/Depth - Progress: Progressing toward goal Goals/treatment plan/discharge plan were made with and agreed upon by patient/family: No, Patient unable to participate in goals/treatment/discharge plan and family unavailable Time For Goal Achievement: 7 days Wound Therapy - Potential for Goals: Good  Goals will be updated until maximal potential achieved or discharge criteria met.  Discharge criteria: when goals achieved, discharge from hospital, MD decision/surgical intervention, no progress towards goals, refusal/missing three  consecutive treatments without notification or medical reason.  GP      [name], PT, DPT Acute Rehabilitation Services Pager [phone] Office [phone]   [name] 12/27/2019, 10:19 AM

## 2019-12-27 NOTE — Progress Notes (Signed)
Physical Therapy Treatment Patient Details Name: [name] MRN: [id number] DOB: 06-19-1956 Today's Date: 12/27/2019    History of Present Illness pt is a 64 y/o male with pmhx significant for CAD, DM, CABG, ACDF admitted 4/2 with COVID PNA and delirium, intubated 4/5, proned and paralyzed 4/5-4/9, sedated until 4/12, Bronchoscopy for tube change 4/16, 4/19 trached.  5/16 off sedation and on TC.    PT Comments    Pt improving slowly toward goals.  Still needs to be helped focus on task, follows command with cues inconsistently, but improving.  Emphasis on transitions, sitting balance, sit to stand and transfers to the chair.    Follow Up Recommendations  SNF     Equipment Recommendations  Other (comment)    Recommendations for Other Services       Precautions / Restrictions Precautions Precautions: Fall Precaution Comments: trach,, large sacral decubitus    Mobility  Bed Mobility Overal bed mobility: Needs Assistance       Supine to sit: Max assist;+2 for physical assistance     General bed mobility comments: up via L elbow, total to scoot to EOB  Transfers Overall transfer level: Needs assistance   Transfers: Sit to/from Stand;Stand Pivot Transfers Sit to Stand: Total assist;+2 physical assistance   Squat pivot transfers: Total assist;+2 physical assistance     General transfer comment: Noticed pt giving effort to assist today.  Ambulation/Gait                 Stairs             Wheelchair Mobility    Modified Rankin (Stroke Patients Only)       Balance Overall balance assessment: Needs assistance Sitting-balance support: Feet supported;Bilateral upper extremity supported;Single extremity supported Sitting balance-Leahy Scale: Poor Sitting balance - Comments: pt's trunk now active with small improvements daily.  Some co contraction with abdominals more active than paraspinals.  Pt needed min to mod assist to attain full cervical  extension and able to hold for about 3 secs with gradual flexion.     Standing balance-Leahy Scale: Zero                              Cognition Arousal/Alertness: Awake/alert Behavior During Therapy: Restless;WFL for tasks assessed/performed Overall Cognitive Status: Impaired/Different from baseline                   Orientation Level: Place;Time;Situation;Disoriented to Current Attention Level: Focused   Following Commands: Follows one step commands inconsistently;Follows one step commands with increased time   Awareness: Intellectual          Exercises      General Comments        Pertinent Vitals/Pain Pain Assessment: Faces Faces Pain Scale: Hurts little more Pain Location: buttock Pain Descriptors / Indicators: Discomfort;Grimacing(squirming) Pain Intervention(s): Monitored during session    Home Living                      Prior Function            PT Goals (current goals can now be found in the care plan section) Acute Rehab PT Goals PT Goal Formulation: Patient unable to participate in goal setting Time For Goal Achievement: 12/30/19 Potential to Achieve Goals: Fair Progress towards PT goals: Progressing toward goals    Frequency    Min 3X/week      PT Plan Current plan remains  appropriate;Frequency needs to be updated    Co-evaluation              AM-PAC PT "6 Clicks" Mobility   Outcome Measure  Help needed turning from your back to your side while in a flat bed without using bedrails?: Total Help needed moving from lying on your back to sitting on the side of a flat bed without using bedrails?: Total Help needed moving to and from a bed to a chair (including a wheelchair)?: Total Help needed standing up from a chair using your arms (e.g., wheelchair or bedside chair)?: Total Help needed to walk in hospital room?: Total Help needed climbing 3-5 steps with a railing? : Total 6 Click Score: 6    End of  Session   Activity Tolerance: Patient tolerated treatment well;Patient limited by fatigue Patient left: in chair;with call bell/phone within reach;with chair alarm set;Other (comment)(mitts) Nurse Communication: Mobility status PT Visit Diagnosis: Other abnormalities of gait and mobility (R26.89);Muscle weakness (generalized) (M62.81);Difficulty in walking, not elsewhere classified (R26.2)     Time: 1120-1150 PT Time Calculation (min) (ACUTE ONLY): 30 min  Charges:  $Therapeutic Activity: 8-22 mins                     12/27/2019  Jacinto Halim., PT Acute Rehabilitation Services [phone]  (pager) [phone]  (office)   Eliseo Gum Caitlin Hillmer 12/27/2019, 12:19 PM

## 2019-12-28 LAB — CBC WITH DIFFERENTIAL/PLATELET
Abs Immature Granulocytes: 0.14 10*3/uL — ABNORMAL HIGH (ref 0.00–0.07)
Basophils Absolute: 0.1 10*3/uL (ref 0.0–0.1)
Basophils Relative: 1 %
Eosinophils Absolute: 7.1 10*3/uL — ABNORMAL HIGH (ref 0.0–0.5)
Eosinophils Relative: 34 %
HCT: 30.1 % — ABNORMAL LOW (ref 39.0–52.0)
Hemoglobin: 9.1 g/dL — ABNORMAL LOW (ref 13.0–17.0)
Immature Granulocytes: 1 %
Lymphocytes Relative: 14 %
Lymphs Abs: 2.9 10*3/uL (ref 0.7–4.0)
MCH: 27.7 pg (ref 26.0–34.0)
MCHC: 30.2 g/dL (ref 30.0–36.0)
MCV: 91.5 fL (ref 80.0–100.0)
Monocytes Absolute: 1 10*3/uL (ref 0.1–1.0)
Monocytes Relative: 5 %
Neutro Abs: 10 10*3/uL — ABNORMAL HIGH (ref 1.7–7.7)
Neutrophils Relative %: 45 %
Platelets: 286 10*3/uL (ref 150–400)
RBC: 3.29 MIL/uL — ABNORMAL LOW (ref 4.22–5.81)
RDW: 15 % (ref 11.5–15.5)
WBC: 21.3 10*3/uL — ABNORMAL HIGH (ref 4.0–10.5)
nRBC: 0 % (ref 0.0–0.2)

## 2019-12-28 LAB — BASIC METABOLIC PANEL
Anion gap: 10 (ref 5–15)
BUN: 30 mg/dL — ABNORMAL HIGH (ref 8–23)
CO2: 32 mmol/L (ref 22–32)
Calcium: 8.9 mg/dL (ref 8.9–10.3)
Chloride: 99 mmol/L (ref 98–111)
Creatinine, Ser: 0.54 mg/dL — ABNORMAL LOW (ref 0.61–1.24)
GFR calc Af Amer: >60 mL/min (ref >60)
GFR calc non Af Amer: >60 mL/min (ref >60)
Glucose, Bld: 164 mg/dL — ABNORMAL HIGH (ref 70–99)
Potassium: 3.5 mmol/L (ref 3.5–5.1)
Sodium: 141 mmol/L (ref 135–145)

## 2019-12-28 LAB — GLUCOSE, CAPILLARY
Glucose-Capillary: 148 mg/dL — ABNORMAL HIGH (ref 70–99)
Glucose-Capillary: 160 mg/dL — ABNORMAL HIGH (ref 70–99)
Glucose-Capillary: 162 mg/dL — ABNORMAL HIGH (ref 70–99)
Glucose-Capillary: 164 mg/dL — ABNORMAL HIGH (ref 70–99)
Glucose-Capillary: 150 mg/dL — ABNORMAL HIGH (ref 70–99)

## 2019-12-28 NOTE — Progress Notes (Signed)
Physical Therapy Wound Treatment Patient Details  Name: [name] MRN: [id number] Date of Birth: [birth year]  Today's Date: 12/28/2019 Time: 2500-3704 Time Calculation (min): 42 min  Subjective  Subjective: Desaturation to mid 80's during session; RN notified and saturations improved > 90% with suction. Patient and Family Stated Goals: continued progression of mobility  Date of Onset: (unknown) Prior Treatments: moist gauze and santyl   Pain Score:  Flinching with dressing removal  Wound Assessment  Pressure Injury 12/12/19 Coccyx Mid Unstageable - Full thickness tissue loss in which the base of the injury is covered by slough (yellow, tan, gray, green or brown) and/or eschar (tan, brown or black) in the wound bed. (Active)  Dressing Type ABD;Moist to moist;Other (Comment);Barrier Film (skin prep);Impregnated gauze (petrolatum) 12/28/19 1710  Dressing Other (Comment);Changed 12/28/19 1710  Dressing Change Frequency Daily 12/28/19 1710  State of Healing Eschar 12/28/19 1710  Site / Wound Assessment Black;Red;Yellow;Brown 12/28/19 1710  % Wound base Red or Granulating 15% 12/28/19 1710  % Wound base Yellow/Fibrinous Exudate 85% 12/28/19 1710  % Wound base Black/Eschar 90% 12/16/19 1000  Peri-wound Assessment Erythema (blanchable) 12/28/19 1710  Wound Length (cm) 9.2 cm 12/25/19 1400  Wound Width (cm) 9.5 cm 12/25/19 1400  Wound Depth (cm) 1.4 cm 12/25/19 1400  Wound Surface Area (cm^2) 87.4 cm^2 12/25/19 1400  Wound Volume (cm^3) 122.36 cm^3 12/25/19 1400  Margins Unattached edges (unapproximated) 12/28/19 1710  Drainage Amount Moderate 12/28/19 1710  Drainage Description Serosanguineous;Green 12/28/19 1710  Treatment Debridement (Selective);Hydrotherapy (Pulse lavage);Packing (Saline gauze) 12/28/19 1710  Santyl applied to wound bed prior to applying dressing.   Hydrotherapy Pulsed lavage therapy - wound location: sacral wound Pulsed Lavage with Suction (psi): 12 psi Pulsed  Lavage with Suction - Normal Saline Used: 1000 mL Pulsed Lavage Tip: Tip with splash shield Selective Debridement Selective Debridement - Location: sacral wound Selective Debridement - Tools Used: Forceps;Scissors Selective Debridement - Tissue Removed: eschar   Wound Assessment and Plan  Wound Therapy - Assess/Plan/Recommendations Wound Therapy - Clinical Statement: Wound continues to improve, with small areas of red granulation tissue appearing. Continues debridement of necrotic edges, soft eschar, and yellow/fibrinous exudate, specifically in superior portion of wound. Pt wound continues to progress with hydrotherapy and granulation tissue continues to proliferate.  Wound Therapy - Functional Problem List: sedated, and immobile Factors Delaying/Impairing Wound Healing: Immobility;Incontinence;Multiple medical problems Hydrotherapy Plan: Debridement;Pulsatile lavage with suction;Patient/family education Wound Therapy - Frequency: 6X / week Wound Therapy - Follow Up Recommendations: Skilled nursing facility Wound Plan: see above  Wound Therapy Goals- Improve the function of patient's integumentary system by progressing the wound(s) through the phases of wound healing (inflammation - proliferation - remodeling) by: Decrease Necrotic Tissue to: 5 Decrease Necrotic Tissue - Progress: Progressing toward goal Increase Granulation Tissue to: 95 Increase Granulation Tissue - Progress: Progressing toward goal Decrease Length/Width/Depth by (cm): 10% Goals/treatment plan/discharge plan were made with and agreed upon by patient/family: No, Patient unable to participate in goals/treatment/discharge plan and family unavailable Time For Goal Achievement: 7 days Wound Therapy - Potential for Goals: Good  Goals will be updated until maximal potential achieved or discharge criteria met.  Discharge criteria: when goals achieved, discharge from hospital, MD decision/surgical intervention, no progress  towards goals, refusal/missing three consecutive treatments without notification or medical reason.  GP    [name], PT, DPT Acute Rehabilitation Services Pager [phone] Office [phone]      [name] 12/28/2019, 5:13 PM

## 2019-12-28 NOTE — Progress Notes (Signed)
NAME:  [name], MRN:  [id number], DOB:  Mar 22, 1956, LOS: 50 ADMISSION DATE:  11/10/2019, CONSULTATION DATE:  4/5 REFERRING MD:  Jerral Giacomo, CHIEF COMPLAINT:  Dyspnea   Brief History   64 year old man admitted 4/2 with COVID pneumonia and delirium after 1 week of symptoms.  Intubated on 4/5, required prolonged mechanical ventilatory support.  Tracheostomy performed on 4/20  5/17 pending LTAC placement- -this is delayed due to leukocytosis, fevers and some difficult vent weaning and anxiety   Past Medical History  Laceration of left brachial artery  Hypercholesteremia HX of heart bypass GERD Type 2 diabetes CAD Asthma   Significant Hospital Events   4/19: tachypneic overnight and this am. Unable to wean sedation. Tracheostomy done  4/20: successful trach yesterday but de-recruited and on some escalated settings today. Remains tachypneic this am. Hypoglycemic yesterday and lantus changed 4/22: started on empiric abx but resp cx with abundant gpc. Improving oxygenation requirement but remains with rass -4 despite light sedation.  4/23:mucous plugging overnight and this am with copious/thick secretions.  4/28: issues overnight with increased vent settings. Still without purposeful responses. Fever overnight as well to 101.1. remains minimally responsive and unable to wean iv sedation except for short periods, despite increase in po agents.  4/29: somewhat improved settings overnight on 60/8. Still with temps overnight to 100.7 5/1: Continued improvement in vent requirement 50/8, remains on several sedating medications including continuous fentanyl and precedex. Trach changed to 6.0 shiley XLT. 5/7 tolerating pressure support ventilation while on sedation 5/16 off sedative infusions and on to trach collar. Tolerated x 3 hours 5/17 CPAP overnight. PRN APAP for fevers.  5/18 No issues overnight, placed on ATC this AM and tolerating well  5/20 Remained on ATC overnight, mild agitation noted  overnight with addition of low dose precedex drip this resolved   Consults:    Procedures:  04/05 Rt CVC > 4/24 04/16 Bronchoscopy  04/20Tracheostomy 4/24 Rt PICC  Significant Diagnostic Tests:  4/22 CXR: 1. Lines and tubes stable position. 2. Persistent bibasilar pulmonary infiltrates/edema with interim slight clearing from prior exam. 3. Prior CABG. Heart size stable.  4/24 CXR: Slight increase in interstitial and airspace opacities particularly in the lower lobes. Findings suspicious for worsening asymmetric edema or infection.  Head CT 4/27 Mild chronic ischemic white matter disease. No acute intracranial abnormality seen.  Abdominal CT 4/27 > for PEG, trace pneumoperitoneum, extensive airspace disease due to COVID 19, kidney stone left, non-obstructing  MRI brain 5/3 > NAICP, chronic small vessel ischemia, chronic microhemorrhages, stable mild generalized atrophy, ethmoid sinus thickening, bilateral mastoid effusions  Chest CT 5/11 > 1. Pulmonary fibrosis with UIP pattern, new from 2015. 2. There is lower lobe atelectasis. No edema or convincing Pneumonia.  ECHO 5/20> LVEF 50% Mildly decreased LV function, without wall motion abnormalities. Grade I diastolic dysfunction. RV systolic function normal, normal pulmonary artery systolic pressure. RVSP 28.2 mmHgf  Micro Data:  COVID 4/2 >Positive  Blood cultures 4/2 > negative  Respiratory culture 4/5 > Negative  Respiratory culture 4/12 > Normal flora  Respiratory culture 4/21 > Normal flora  BCx 5/15> no growth 2 days Tracheal aspirate 5/14> Rare GPR-- consistent with normal respiratory flora  Sacral wound cx 5/15> pseudomonas aeruginosa   Antimicrobials:  4/21 Vancomycin > 4/23 4/21 Zosyn > 4/26, 5/19>>   Interim history/subjective:   Remains on Zosyn for a sacral wound Has been on trach collar for 4 days Remains on Precedex 0.6 Has pain with hydrotherapy  Objective   Blood pressure 119/67,  pulse 90, temperature 98.5 F (36.9 C), temperature source Axillary, resp. rate (!) 28, height 5\' 8"  (1.727 m), weight 75.9 kg, SpO2 96 %.    FiO2 (%):  [28 %-35 %] 35 %   Intake/Output Summary (Last 24 hours) at 12/28/2019 1530 Last data filed at 12/28/2019 0900 Gross per 24 hour  Intake 2137.01 ml  Output 1705 ml  Net 432.01 ml   Filed Weights   12/26/19 0500 12/27/19 0342 12/28/19 0154  Weight: 80.4 kg 73.9 kg 75.9 kg    Examination: General: Chronically ill appearing older adult M, tract collar, reclined in bed NAD  HEENT: NCAT. #6 cuffed shiley trach secure. Trachea midline. Pink mmm. Anicteric sclera. Neuro: Awake, drowsy. Following commands. PERRLA 45mm  CV: rrr s1s2 no rgm. Cap refill < 3 seconds  PULM: Symmetrical chest expansion, even unlabored respirations on trach collar. Faint scattered rhonchi bilaterally GI: Soft, round, ndnt normoactive x4soft Extremities: Symmetrical muscle bulk and tone. No obvious joint deformity, no cyanosis or clubbing  Skin: clean, dry, warm  Resolved Hospital Problem list   COVID-19 pneumonia -Has completed full course of remdesivir and dexamethasone, off isolation 4/23.  HCAP Pneumomediastinum/pneumopericardium Urinary retention VDRF  Assessment & Plan:   Acute respiratory failure with hypoxia, s/p tracheostomy in setting of COVID-19 PNA, deconditioning Post COVID pulmonary fibrosis  -Pulmonary fibrosis with UIP pattern on chest CT 5/11 -Improving tachypnea (felt to be due to anxiety), which had impeded vent weaning/liberation previously P Doing well on ATC, plan to continue.  PMV as he can tolerate and as per SLP recommendations Trach care Continue pulmonary hygiene  Toxic metabolic encephalopathy in setting of prolonged sedation, improving  Anxiety, possible critical illness related ptsd  -anxiety manifesting as increased RR, pt endorses feels of panic, anxiety, sleep disturbance  -completed clonidine taper 5/19, then placed  back no precedex  P Clonidine ordered to facilitate weaning Precedex.  Continue to decrease Precedex 5/22, goal to off Fentanyl patch in place Trazodone, BuSpar, clonazepam, Seroquel as ordered  Sacral wound with pseudomonas aeruginosa infection  -Sacral wound positive for PSA  Unstageable sacral pressure ulcer not POA P Zosyn as ordered, planning for 14-day course Continue WOC and hydrotherapy  Volume overload +8.6 L total P: On Lasix 40 twice daily   Goals of care:  Needs LTAC placement. Attempted to complete second peer to peer 5/19 but in conversation with [facility] informed that the approval process for the LTAC placement would need to be resubmitted or an appeal would need to be made. Given significant improvement including no vent support at night will hold on further LTAC discussion for now, patient my be able to discharge to SNF.    Best practice:  Diet:  EN + supervised small sips and ice chips  Pain/Anxiety/Delirium protocol (if indicated): as above VAP protocol (if indicated): yes DVT prophylaxis: lovenox 40mg  daily GI prophylaxis: PPI Glucose control: Lantus+ SSI Mobility: Bed in chair Code Status: full Family Communication :  Disposition: Remains in ICU until off precedex  Labs   CBC: Recent Labs  Lab 12/23/19 0506 12/23/19 0506 12/24/19 0717 12/26/19 0433 12/27/19 0452 12/27/19 0926 12/28/19 0319  WBC 17.8*   < > 19.9* 20.1* 18.3* 19.3* 21.3*  NEUTROABS 11.0*  --   --   --   --   --  10.0*  HGB 8.7*   < > 8.7* 8.8* 9.1* 9.2* 9.1*  HCT 29.3*   < > 28.5* 29.6* 30.6* 30.4* 30.1*  MCV 94.5   < > 94.1 92.5 92.7 92.7 91.5  PLT 275   < > 271 290 305 316 286   < > = values in this interval not displayed.    Basic Metabolic Panel: Recent Labs  Lab 12/23/19 0506 12/24/19 0336 12/26/19 0433 12/27/19 0452 12/28/19 0319  NA 144 145 146* 144 141  K 3.3* 3.7 3.0* 3.5 3.5  CL 102 103 104 105 99  CO2 31 31 31 30  32  GLUCOSE 161* 91 128* 178* 164*  BUN 30*  33* 30* 32* 30*  CREATININE 0.38* 0.33* 0.48* 0.49* 0.54*  CALCIUM 8.7* 8.8* 8.8* 9.0 8.9   GFR: Estimated Creatinine Clearance: 89.1 mL/min (A) (by C-G formula based on SCr of 0.54 mg/dL (L)). Recent Labs  Lab 12/26/19 0433 12/27/19 0452 12/27/19 0926 12/28/19 0319  WBC 20.1* 18.3* 19.3* 21.3*    Liver Function Tests: No results for input(s): AST, ALT, ALKPHOS, BILITOT, PROT, ALBUMIN in the last 168 hours. No results for input(s): LIPASE, AMYLASE in the last 168 hours. No results for input(s): AMMONIA in the last 168 hours.  ABG    Component Value Date/Time   PHART 7.449 12/18/2019 1232   PCO2ART 52.5 (H) 12/18/2019 1232   PO2ART 68 (L) 12/18/2019 1232   HCO3 36.4 (H) 12/18/2019 1232   TCO2 38 (H) 12/18/2019 1232   ACIDBASEDEF 1.0 11/11/2019 1529   O2SAT 94.0 12/18/2019 1232     Coagulation Profile: No results for input(s): INR, PROTIME in the last 168 hours.  Cardiac Enzymes: No results for input(s): CKTOTAL, CKMB, CKMBINDEX, TROPONINI in the last 168 hours.  HbA1C: Hgb A1c MFr Bld  Date/Time Value Ref Range Status  12/01/2019 06:51 PM 7.5 (H) 4.8 - 5.6 % Final    Comment:    (NOTE) Pre diabetes:          5.7%-6.4% Diabetes:              >6.4% Glycemic control for   <7.0% adults with diabetes   12/19/2018 02:21 PM 7.7 (H) 4.6 - 6.5 % Final    Comment:    Glycemic Control Guidelines for People with Diabetes:Non Diabetic:  <6%Goal of Therapy: <7%Additional Action Suggested:  >8%     CBG: Recent Labs  Lab 12/27/19 1955 12/27/19 2343 12/28/19 0346 12/28/19 0749 12/28/19 1221  GLUCAP 104* 173* 148* 160* 162*      CRITICAL CARE Performed by: Collene Gobble   Total critical care time: 31 minutes  Critical care time was exclusive of separately billable procedures and treating other patients. Critical care was necessary to treat or prevent imminent or life-threatening deterioration.  Critical care was time spent personally by me on the following  activities: development of treatment plan with patient and/or surrogate as well as nursing, discussions with consultants, evaluation of patient's response to treatment, examination of patient, obtaining history from patient or surrogate, ordering and performing treatments and interventions, ordering and review of laboratory studies, ordering and review of radiographic studies, pulse oximetry and re-evaluation of patient's condition.  Baltazar Apo, MD, PhD 12/28/2019, 3:40 PM [facility] [phone] or if no answer [phone]

## 2019-12-28 NOTE — Progress Notes (Signed)
eLink Physician-Brief Progress Note Patient Name: [name] DOB: Nov 02, 1955 MRN: [id number]   Date of Service  12/28/2019  HPI/Events of Note  Nursing request for CBC in AM.  eICU Interventions  Will order CBC with platelets in AM.      Intervention Category Major Interventions: Other:  Hobson Lax Dennard Nip 12/28/2019, 1:25 AM

## 2019-12-29 LAB — CBC
HCT: 30.2 % — ABNORMAL LOW (ref 39.0–52.0)
Hemoglobin: 9.3 g/dL — ABNORMAL LOW (ref 13.0–17.0)
MCH: 28 pg (ref 26.0–34.0)
MCHC: 30.8 g/dL (ref 30.0–36.0)
MCV: 91 fL (ref 80.0–100.0)
Platelets: 297 10*3/uL (ref 150–400)
RBC: 3.32 MIL/uL — ABNORMAL LOW (ref 4.22–5.81)
RDW: 15.3 % (ref 11.5–15.5)
WBC: 21.8 10*3/uL — ABNORMAL HIGH (ref 4.0–10.5)
nRBC: 0 % (ref 0.0–0.2)

## 2019-12-29 LAB — BASIC METABOLIC PANEL
Anion gap: 8 (ref 5–15)
BUN: 31 mg/dL — ABNORMAL HIGH (ref 8–23)
CO2: 34 mmol/L — ABNORMAL HIGH (ref 22–32)
Calcium: 9.1 mg/dL (ref 8.9–10.3)
Chloride: 101 mmol/L (ref 98–111)
Creatinine, Ser: 0.43 mg/dL — ABNORMAL LOW (ref 0.61–1.24)
GFR calc Af Amer: >60 mL/min (ref >60)
GFR calc non Af Amer: >60 mL/min (ref >60)
Glucose, Bld: 145 mg/dL — ABNORMAL HIGH (ref 70–99)
Potassium: 3.6 mmol/L (ref 3.5–5.1)
Sodium: 143 mmol/L (ref 135–145)

## 2019-12-29 LAB — GLUCOSE, CAPILLARY
Glucose-Capillary: 117 mg/dL — ABNORMAL HIGH (ref 70–99)
Glucose-Capillary: 133 mg/dL — ABNORMAL HIGH (ref 70–99)
Glucose-Capillary: 148 mg/dL — ABNORMAL HIGH (ref 70–99)
Glucose-Capillary: 156 mg/dL — ABNORMAL HIGH (ref 70–99)
Glucose-Capillary: 167 mg/dL — ABNORMAL HIGH (ref 70–99)
Glucose-Capillary: 167 mg/dL — ABNORMAL HIGH (ref 70–99)

## 2019-12-29 LAB — MAGNESIUM: Magnesium: 2 mg/dL (ref 1.7–2.4)

## 2019-12-29 MED ORDER — QUETIAPINE FUMARATE 200 MG PO TABS
200.0000 mg | ORAL_TABLET | Freq: Every day | ORAL | Status: DC
  Administered 2019-12-29 – 2020-01-06 (×9): 200 mg
  Filled 2019-12-29: qty 8
  Filled 2019-12-29: qty 4
  Filled 2019-12-29: qty 1
  Filled 2019-12-29 (×2): qty 8
  Filled 2019-12-29: qty 1
  Filled 2019-12-29: qty 8
  Filled 2019-12-29: qty 1
  Filled 2019-12-29: qty 4
  Filled 2019-12-29: qty 1
  Filled 2019-12-29 (×2): qty 4

## 2019-12-29 MED ORDER — ORAL CARE MOUTH RINSE
15.0000 mL | Freq: Two times a day (BID) | OROMUCOSAL | Status: DC
  Administered 2019-12-30 – 2020-01-07 (×16): 15 mL via OROMUCOSAL

## 2019-12-29 MED ORDER — CLONIDINE HCL 0.2 MG PO TABS
0.1000 mg | ORAL_TABLET | Freq: Every day | ORAL | Status: AC
  Administered 2019-12-31: 0.1 mg
  Filled 2019-12-29: qty 1

## 2019-12-29 MED ORDER — CLONIDINE HCL 0.2 MG PO TABS
0.1000 mg | ORAL_TABLET | Freq: Two times a day (BID) | ORAL | Status: AC
  Administered 2019-12-30 (×2): 0.1 mg
  Filled 2019-12-29 (×2): qty 1

## 2019-12-29 MED ORDER — CHLORHEXIDINE GLUCONATE 0.12 % MT SOLN
15.0000 mL | Freq: Two times a day (BID) | OROMUCOSAL | Status: DC
  Administered 2019-12-30 – 2020-01-07 (×17): 15 mL via OROMUCOSAL
  Filled 2019-12-29 (×17): qty 15

## 2019-12-29 NOTE — Progress Notes (Signed)
NAME:  [name], MRN:  [id number], DOB:  08-07-1956, LOS: 56 ADMISSION DATE:  12/02/2019, CONSULTATION DATE:  4/5 REFERRING MD:  Sloan Leiter, CHIEF COMPLAINT:  Dyspnea   Brief History   64 year old man admitted 4/2 with COVID pneumonia and delirium after 1 week of symptoms.  Intubated on 4/5, required prolonged mechanical ventilatory support.  Tracheostomy performed on 4/20  5/17 pending LTAC placement- -this is delayed due to leukocytosis, fevers and some difficult vent weaning and anxiety   Past Medical History  Laceration of left brachial artery  Hypercholesteremia HX of heart bypass GERD Type 2 diabetes CAD Asthma   Significant Hospital Events   4/19: tachypneic overnight and this am. Unable to wean sedation. Tracheostomy done  4/20: successful trach yesterday but de-recruited and on some escalated settings today. Remains tachypneic this am. Hypoglycemic yesterday and lantus changed 4/22: started on empiric abx but resp cx with abundant gpc. Improving oxygenation requirement but remains with rass -4 despite light sedation.  4/23:mucous plugging overnight and this am with copious/thick secretions.  4/28: issues overnight with increased vent settings. Still without purposeful responses. Fever overnight as well to 101.1. remains minimally responsive and unable to wean iv sedation except for short periods, despite increase in po agents.  4/29: somewhat improved settings overnight on 60/8. Still with temps overnight to 100.7 5/1: Continued improvement in vent requirement 50/8, remains on several sedating medications including continuous fentanyl and precedex. Trach changed to 6.0 shiley XLT. 5/7 tolerating pressure support ventilation while on sedation 5/16 off sedative infusions and on to trach collar. Tolerated x 3 hours 5/17 CPAP overnight. PRN APAP for fevers.  5/18 No issues overnight, placed on ATC this AM and tolerating well  5/20 Remained on ATC overnight, mild agitation noted  overnight with addition of low dose precedex drip this resolved   Consults:    Procedures:  04/05 Rt CVC > 4/24 04/16 Bronchoscopy  04/20Tracheostomy 4/24 Rt PICC  Significant Diagnostic Tests:  4/22 CXR: 1. Lines and tubes stable position. 2. Persistent bibasilar pulmonary infiltrates/edema with interim slight clearing from prior exam. 3. Prior CABG. Heart size stable.  4/24 CXR: Slight increase in interstitial and airspace opacities particularly in the lower lobes. Findings suspicious for worsening asymmetric edema or infection.  Head CT 4/27 Mild chronic ischemic white matter disease. No acute intracranial abnormality seen.  Abdominal CT 4/27 > for PEG, trace pneumoperitoneum, extensive airspace disease due to COVID 19, kidney stone left, non-obstructing  MRI brain 5/3 > NAICP, chronic small vessel ischemia, chronic microhemorrhages, stable mild generalized atrophy, ethmoid sinus thickening, bilateral mastoid effusions  Chest CT 5/11 > 1. Pulmonary fibrosis with UIP pattern, new from 2015. 2. There is lower lobe atelectasis. No edema or convincing Pneumonia.  ECHO 5/20> LVEF 50% Mildly decreased LV function, without wall motion abnormalities. Grade I diastolic dysfunction. RV systolic function normal, normal pulmonary artery systolic pressure. RVSP 28.2 mmHgf  Micro Data:  COVID 4/2 >Positive  Blood cultures 4/2 > negative  Respiratory culture 4/5 > Negative  Respiratory culture 4/12 > Normal flora  Respiratory culture 4/21 > Normal flora  BCx 5/15> no growth 2 days Tracheal aspirate 5/14> Rare GPR-- consistent with normal respiratory flora  Sacral wound cx 5/15> pseudomonas aeruginosa   Antimicrobials:  4/21 Vancomycin > 4/23 4/21 Zosyn > 4/26, 5/19>>   Interim history/subjective:   Remains on Zosyn for a sacral wound Continues to tolerate trach collar Precedex has been weaned to off Tachycardic 120   Objective  Blood pressure (!) 143/78,  pulse (!) 111, temperature 98.6 F (37 C), temperature source Oral, resp. rate 20, height 5\' 8"  (1.727 m), weight 75.3 kg, SpO2 96 %.    FiO2 (%):  [35 %-60 %] 35 %   Intake/Output Summary (Last 24 hours) at 12/29/2019 1307 Last data filed at 12/29/2019 1200 Gross per 24 hour  Intake 2591.74 ml  Output 2650 ml  Net -58.26 ml   Filed Weights   12/27/19 0342 12/28/19 0154 12/29/19 0500  Weight: 73.9 kg 75.9 kg 75.3 kg    Examination: General: Chronically ill man on trach collar, in bed HEENT: Oropharynx clear, #6 Shiley trach in place Neuro: Awake, interacts, follows commands, globally weak CV: Regular, tachycardic, no murmur PULM: Bilateral scattered inspiratory crackles GI: Nondistended, positive bowel sounds Extremities: 1+ lower extremity edema Skin: No rash.  Decubitus ulcer as documented by [facility] Problem list   COVID-19 pneumonia -Has completed full course of remdesivir and dexamethasone, off isolation 4/23.  HCAP Pneumomediastinum/pneumopericardium Urinary retention VDRF  Assessment & Plan:   Acute respiratory failure with hypoxia, s/p tracheostomy in setting of COVID-19 PNA, deconditioning Post COVID pulmonary fibrosis  -Pulmonary fibrosis with UIP pattern on chest CT 5/11 -Improving tachypnea (felt to be due to anxiety), which had impeded vent weaning/liberation previously P Doing well on ATC, plan to continue.  Continue PMV as he can tolerate and as per SLP recommendations Continue trach care and pulmonary hygiene  Toxic metabolic encephalopathy in setting of prolonged sedation, improving  Anxiety, possible critical illness related ptsd  -anxiety manifesting as increased RR, pt endorses feels of panic, anxiety, sleep disturbance  -completed clonidine taper 5/19, then placed back no precedex  P Plan to taper clonidine as Precedex has been weaned to off Fentanyl patch in place On trazodone, BuSpar, clonazepam, Seroquel  Sacral wound with  pseudomonas aeruginosa infection  -Sacral wound positive for PSA  Unstageable sacral pressure ulcer not POA P Continue Zosyn as ordered, planning for 14-day course Continue hydrotherapy.  Appreciate WOC  Volume overload + 7.8 L total P: Continue Lasix twice a day as ordered   Goals of care:  Needs LTAC placement. Attempted to complete second peer to peer 5/19 but in conversation with [facility] informed that the approval process for the LTAC placement would need to be resubmitted or an appeal would need to be made. Given significant improvement including no vent support at night will hold on further LTAC discussion for now, patient my be able to discharge to SNF.    Best practice:  Diet:  EN + supervised small sips and ice chips  Pain/Anxiety/Delirium protocol (if indicated): as above VAP protocol (if indicated): yes DVT prophylaxis: lovenox 40mg  daily GI prophylaxis: PPI Glucose control: Lantus+ SSI Mobility: Bed in chair Code Status: full Family Communication :  Disposition: To progressive care on 5/24 if he is able to stay off the Precedex  Labs   CBC: Recent Labs  Lab 12/23/19 0506 12/24/19 0717 12/26/19 0433 12/27/19 0452 12/27/19 0926 12/28/19 0319 12/29/19 0328  WBC 17.8*   < > 20.1* 18.3* 19.3* 21.3* 21.8*  NEUTROABS 11.0*  --   --   --   --  10.0*  --   HGB 8.7*   < > 8.8* 9.1* 9.2* 9.1* 9.3*  HCT 29.3*   < > 29.6* 30.6* 30.4* 30.1* 30.2*  MCV 94.5   < > 92.5 92.7 92.7 91.5 91.0  PLT 275   < > 290 305 316 286 297   < > =  values in this interval not displayed.    Basic Metabolic Panel: Recent Labs  Lab 12/24/19 0336 12/26/19 0433 12/27/19 0452 12/28/19 0319 12/29/19 0328  NA 145 146* 144 141 143  K 3.7 3.0* 3.5 3.5 3.6  CL 103 104 105 99 101  CO2 31 31 30  32 34*  GLUCOSE 91 128* 178* 164* 145*  BUN 33* 30* 32* 30* 31*  CREATININE 0.33* 0.48* 0.49* 0.54* 0.43*  CALCIUM 8.8* 8.8* 9.0 8.9 9.1  MG  --   --   --   --  2.0   GFR: Estimated Creatinine  Clearance: 89.1 mL/min (A) (by C-G formula based on SCr of 0.43 mg/dL (L)). Recent Labs  Lab 12/27/19 0452 12/27/19 0926 12/28/19 0319 12/29/19 0328  WBC 18.3* 19.3* 21.3* 21.8*    Liver Function Tests: No results for input(s): AST, ALT, ALKPHOS, BILITOT, PROT, ALBUMIN in the last 168 hours. No results for input(s): LIPASE, AMYLASE in the last 168 hours. No results for input(s): AMMONIA in the last 168 hours.  ABG    Component Value Date/Time   PHART 7.449 12/18/2019 1232   PCO2ART 52.5 (H) 12/18/2019 1232   PO2ART 68 (L) 12/18/2019 1232   HCO3 36.4 (H) 12/18/2019 1232   TCO2 38 (H) 12/18/2019 1232   ACIDBASEDEF 1.0 11/11/2019 1529   O2SAT 94.0 12/18/2019 1232     Coagulation Profile: No results for input(s): INR, PROTIME in the last 168 hours.  Cardiac Enzymes: No results for input(s): CKTOTAL, CKMB, CKMBINDEX, TROPONINI in the last 168 hours.  HbA1C: Hgb A1c MFr Bld  Date/Time Value Ref Range Status  11/27/2019 06:51 PM 7.5 (H) 4.8 - 5.6 % Final    Comment:    (NOTE) Pre diabetes:          5.7%-6.4% Diabetes:              >6.4% Glycemic control for   <7.0% adults with diabetes   12/19/2018 02:21 PM 7.7 (H) 4.6 - 6.5 % Final    Comment:    Glycemic Control Guidelines for People with Diabetes:Non Diabetic:  <6%Goal of Therapy: <7%Additional Action Suggested:  >8%     CBG: Recent Labs  Lab 12/28/19 2011 12/29/19 0024 12/29/19 0458 12/29/19 0759 12/29/19 1145  GLUCAP 150* 167* 148* 133* 117*      CRITICAL CARE Performed by: 12/31/19   Total critical care time: 31 minutes  Critical care time was exclusive of separately billable procedures and treating other patients. Critical care was necessary to treat or prevent imminent or life-threatening deterioration.  Critical care was time spent personally by me on the following activities: development of treatment plan with patient and/or surrogate as well as nursing, discussions with consultants,  evaluation of patient's response to treatment, examination of patient, obtaining history from patient or surrogate, ordering and performing treatments and interventions, ordering and review of laboratory studies, ordering and review of radiographic studies, pulse oximetry and re-evaluation of patient's condition.  Leslye Peer, MD, PhD 12/29/2019, 1:07 PM [facility] [phone] or if no answer [phone]

## 2019-12-29 NOTE — Progress Notes (Signed)
eLink Physician-Brief Progress Note Patient Name: [name] DOB: June 03, 1956 MRN: [id number]   Date of Service  12/29/2019  HPI/Events of Note  RN called for assistance with agitation Patient is on a fentanyl patch and has PRN fentanyl ordered, this was just given within th last hour Has multiple other meds listed for 9 pm, including clonazepam Will try those meds earlier today (asked RN to stagger them and administer, starting with seroquel) If all of those are given, and he remains agitated, we [name] try PRN IV ativan or then need precedex   eICU Interventions  RN will update [place]      Intervention Category Major Interventions: Delirium, psychosis, severe agitation - evaluation and management  Oretha Milch 12/29/2019, 7:39 PM

## 2019-12-30 DIAGNOSIS — U071 COVID-19: Secondary | ICD-10-CM

## 2019-12-30 DIAGNOSIS — J9601 Acute respiratory failure with hypoxia: Secondary | ICD-10-CM

## 2019-12-30 LAB — GLUCOSE, CAPILLARY
Glucose-Capillary: 138 mg/dL — ABNORMAL HIGH (ref 70–99)
Glucose-Capillary: 150 mg/dL — ABNORMAL HIGH (ref 70–99)
Glucose-Capillary: 156 mg/dL — ABNORMAL HIGH (ref 70–99)
Glucose-Capillary: 159 mg/dL — ABNORMAL HIGH (ref 70–99)
Glucose-Capillary: 176 mg/dL — ABNORMAL HIGH (ref 70–99)
Glucose-Capillary: 177 mg/dL — ABNORMAL HIGH (ref 70–99)
Glucose-Capillary: 197 mg/dL — ABNORMAL HIGH (ref 70–99)

## 2019-12-30 MED ORDER — FREE WATER
200.0000 mL | Status: DC
  Administered 2019-12-30 – 2020-01-01 (×12): 200 mL

## 2019-12-30 MED ORDER — POTASSIUM CHLORIDE 20 MEQ/15ML (10%) PO SOLN
40.0000 meq | Freq: Once | ORAL | Status: AC
  Administered 2019-12-30: 40 meq
  Filled 2019-12-30: qty 30

## 2019-12-30 MED ORDER — METOLAZONE 5 MG PO TABS
5.0000 mg | ORAL_TABLET | Freq: Once | ORAL | Status: AC
  Administered 2019-12-30: 5 mg via ORAL
  Filled 2019-12-30: qty 1

## 2019-12-30 NOTE — Progress Notes (Signed)
Occupational Therapy Treatment Patient Details Name: [name] MRN: [id number] DOB: 1956-01-18 Today's Date: 12/30/2019    History of present illness pt is a 64 y/o male with pmhx significant for CAD, DM, CABG, ACDF admitted 4/2 with COVID PNA and delirium, intubated 4/5, proned and paralyzed 4/5-4/9, sedated until 4/12, Bronchoscopy for tube change 4/16, 4/19 trached.  5/16 off sedation and on TC.   OT comments  Focus of session on bed mobility and sitting balance/trunk control at EOB with facilitation and +2 assist. Sacral pain likely playing role in pt's leaning and decreased tolerance of sitting in alignment. Pt on 35% via trach collar, dropped down briefly to 75% when 02 not adequately aligned with trach, but quickly rebounded back into the 90's. Family updated at end of session upon her arrival.   Follow Up Recommendations  SNF;Supervision/Assistance - 24 hour(vs LTACH)    Equipment Recommendations  Other (comment)(defer to next venue)    Recommendations for Other Services      Precautions / Restrictions Precautions Precautions: Fall Precaution Comments: trach, large sacral decubitus, peg tube, Prom use PSMV with therapy       Mobility Bed Mobility Overal bed mobility: Needs Assistance Bed Mobility: Supine to Sit;Sit to Supine Rolling: Mod assist   Supine to sit: Max assist;+2 for safety/equipment Sit to supine: Max assist;+2 for safety/equipment;+2 for physical assistance   General bed mobility comments: pt assist up via L elbow, but once up didn't try to "square up" at EOB until maximally assisted with directional cues.  Transfers Overall transfer level: Needs assistance   Transfers: Sit to/from Stand Sit to Stand: Total assist;+2 safety/equipment         General transfer comment: pt was preoccupied with getting off his rear end, but once stand initiated, he assisted mildly with LE's, but overall still need total assist    Balance Overall balance assessment:  Needs assistance Sitting-balance support: Feet supported;Single extremity supported;Bilateral upper extremity supported Sitting balance-Leahy Scale: Poor Sitting balance - Comments: Initially pt leaning/almost pushing to the right and not wanting to draw back to midline  (suspecting buttock pain after initially wondering why).  Once at midline worked on Upright sitting/extension with scapular retraction/post pelvic tilt.     Standing balance-Leahy Scale: Zero                             ADL either performed or assessed with clinical judgement   ADL                                         General ADL Comments: Total assist, placed washcloth in pt's hand and guided toward his face while asking him to wash his face, but pt actively resisting. Total assist for pericare.     Vision       Perception     Praxis      Cognition Arousal/Alertness: Awake/alert Behavior During Therapy: Restless Overall Cognitive Status: Impaired/Different from baseline Area of Impairment: Attention;Following commands;Safety/judgement;Awareness;Problem solving                 Orientation Level: Time;Situation(?place) Current Attention Level: Focused   Following Commands: Follows one step commands inconsistently Safety/Judgement: Decreased awareness of safety;Decreased awareness of deficits Awareness: Intellectual Problem Solving: Slow processing;Decreased initiation;Difficulty sequencing;Requires verbal cues          Exercises  Shoulder Instructions       General Comments vss for most of the session, but at some point the TC came off while on 35% FiO2 and pt's sats dropped to 75% with good waveform, quick rose back up to 85 and topped out at 91% in 2 minutes time.    Pertinent Vitals/ Pain       Pain Assessment: Faces Faces Pain Scale: Hurts even more Pain Location: buttock Pain Descriptors / Indicators: Discomfort;Grimacing(trying to get off his  buttock) Pain Intervention(s): Monitored during session;Repositioned;Limited activity within patient's tolerance  Home Living                                          Prior Functioning/Environment              Frequency  Min 2X/week        Progress Toward Goals  OT Goals(current goals can now be found in the care plan section)  Progress towards OT goals: Progressing toward goals  Acute Rehab OT Goals Patient Stated Goal: pt unable OT Goal Formulation: Patient unable to participate in goal setting Time For Goal Achievement: 01/10/20 Potential to Achieve Goals: Fair  Plan Discharge plan remains appropriate    Co-evaluation    PT/OT/SLP Co-Evaluation/Treatment: Yes Reason for Co-Treatment: Complexity of the patient's impairments (multi-system involvement);Necessary to address cognition/behavior during functional activity;For patient/therapist safety          AM-PAC OT "6 Clicks" Daily Activity     Outcome Measure   Help from another person eating meals?: Total Help from another person taking care of personal grooming?: Total Help from another person toileting, which includes using toliet, bedpan, or urinal?: Total Help from another person bathing (including washing, rinsing, drying)?: Total Help from another person to put on and taking off regular upper body clothing?: Total Help from another person to put on and taking off regular lower body clothing?: Total 6 Click Score: 6    End of Session    OT Visit Diagnosis: Other abnormalities of gait and mobility (R26.89);Muscle weakness (generalized) (M62.81);Pain;Other symptoms and signs involving cognitive function;Cognitive communication deficit (T70.177)   Activity Tolerance     Patient Left     Nurse Communication Mobility status        Time: 9390-3009 OT Time Calculation (min): 29 min  Charges: OT General Charges $OT Visit: 1 Visit OT Treatments $Therapeutic Activity: 8-22  mins  Martie Round, OTR/L Acute Rehabilitation Services Pager: [phone] Office: [phone]   [name] 12/30/2019, 1:30 PM

## 2019-12-30 NOTE — Progress Notes (Signed)
Physical Therapy Wound Treatment Patient Details  Name: [name] MRN: [id number] Date of Birth: [birth year]  Today's Date: 12/30/2019 Time: 0920-0955 Time Calculation (min): 35 min  Subjective  Subjective: Pt following commands for moving extremities 60% of the time Patient and Family Stated Goals: continued progression of mobility  Date of Onset: (unknown) Prior Treatments: moist gauze and santyl   Pain Score:  Pt premedicated prior to therapy, no evidence of increased pain with hydrotherapy today   Wound Assessment  Pressure Injury 12/12/19 Coccyx Mid Unstageable - Full thickness tissue loss in which the base of the injury is covered by slough (yellow, tan, gray, green or brown) and/or eschar (tan, brown or black) in the wound bed. (Active)  Wound Image   12/24/19 1500  Dressing Type ABD;Moist to moist;Other (Comment);Barrier Film (skin prep);Impregnated gauze (petrolatum) 12/30/19 1300  Dressing Other (Comment);Changed 12/30/19 1300  Dressing Change Frequency Daily 12/30/19 1300  State of Healing Eschar 12/30/19 1300  Site / Wound Assessment Red;Yellow;Brown 12/30/19 1300  % Wound base Red or Granulating 15% 12/30/19 1300  % Wound base Yellow/Fibrinous Exudate 85% 12/30/19 1300  % Wound base Black/Eschar 90% 12/16/19 1000  Peri-wound Assessment Erythema (blanchable) 12/30/19 1300  Wound Length (cm) 9.2 cm 12/25/19 1400  Wound Width (cm) 9.5 cm 12/25/19 1400  Wound Depth (cm) 1.4 cm 12/25/19 1400  Wound Surface Area (cm^2) 87.4 cm^2 12/25/19 1400  Wound Volume (cm^3) 122.36 cm^3 12/25/19 1400  Margins Unattached edges (unapproximated) 12/30/19 1300  Drainage Amount Moderate 12/30/19 1300  Drainage Description Serosanguineous 12/30/19 1300  Treatment Debridement (Selective);Hydrotherapy (Pulse lavage);Packing (Saline gauze) 12/30/19 1300   Hydrotherapy Pulsed lavage therapy - wound location: sacral wound Pulsed Lavage with Suction (psi): 12 psi Pulsed Lavage with Suction -  Normal Saline Used: 1000 mL Pulsed Lavage Tip: Tip with splash shield Selective Debridement Selective Debridement - Location: sacral wound Selective Debridement - Tools Used: Forceps;Scissors Selective Debridement - Tissue Removed: eschar   Wound Assessment and Plan  Wound Therapy - Assess/Plan/Recommendations Wound Therapy - Clinical Statement: Granulation tissue continues to proliferate, however deep superior area of wound continues to have yellow gray stringy slough material. Pt will contine to benefit from pulse lavage and selective debridement of necrotic tissue to decreased bioburden allowing for continued proliferation of granulation tissue.  Wound Therapy - Functional Problem List: immobile Factors Delaying/Impairing Wound Healing: Immobility;Incontinence;Multiple medical problems Hydrotherapy Plan: Debridement;Pulsatile lavage with suction;Patient/family education Wound Therapy - Frequency: 6X / week Wound Therapy - Follow Up Recommendations: Skilled nursing facility Wound Plan: see above  Wound Therapy Goals- Improve the function of patient's integumentary system by progressing the wound(s) through the phases of wound healing (inflammation - proliferation - remodeling) by: Decrease Necrotic Tissue to: 5 Decrease Necrotic Tissue - Progress: Progressing toward goal Increase Granulation Tissue to: 95 Increase Granulation Tissue - Progress: Progressing toward goal Decrease Length/Width/Depth by (cm): 10% Goals/treatment plan/discharge plan were made with and agreed upon by patient/family: No, Patient unable to participate in goals/treatment/discharge plan and family unavailable Time For Goal Achievement: 7 days Wound Therapy - Potential for Goals: Good  Goals will be updated until maximal potential achieved or discharge criteria met.  Discharge criteria: when goals achieved, discharge from hospital, MD decision/surgical intervention, no progress towards goals, refusal/missing three  consecutive treatments without notification or medical reason.  GP    Dani Gobble. Migdalia Dk PT, DPT Acute Rehabilitation Services Pager [phone] Office [phone]  [place] 12/30/2019, 1:44 PM

## 2019-12-30 NOTE — Progress Notes (Signed)
Physical Therapy Treatment Patient Details Name: [name] MRN: [id number] DOB: 04-25-1956 Today's Date: 12/30/2019    History of Present Illness pt is a 64 y/o male with pmhx significant for CAD, DM, CABG, ACDF admitted 4/2 with COVID PNA and delirium, intubated 4/5, proned and paralyzed 4/5-4/9, sedated until 4/12, Bronchoscopy for tube change 4/16, 4/19 trached.  5/16 off sedation and on TC.    PT Comments    Pt appeared to quickly become too sore at his buttock wound to work on sitting EOB, noting pt listing off to the right hard and not wanting to come back to midline.  Worked in midline for ~8 min in upright extension with post pelvic tilt and scapular retraction.  Sit to stand still difficult for this pt, but his LE's do kick in some once stand is initiated.      Follow Up Recommendations  SNF     Equipment Recommendations  Other (comment)(TBA)    Recommendations for Other Services       Precautions / Restrictions Precautions Precautions: Fall Precaution Comments: trach, large sacral decubitus, peg tube, Romm use PSMV with therapy    Mobility  Bed Mobility Overal bed mobility: Needs Assistance Bed Mobility: Supine to Sit;Sit to Supine Rolling: Mod assist   Supine to sit: Max assist;+2 for safety/equipment Sit to supine: Max assist;+2 for safety/equipment;+2 for physical assistance   General bed mobility comments: pt assist up via L elbow, but once up didn't try to "square up" at EOB until maximally assisted with directional cues.  Transfers Overall transfer level: Needs assistance   Transfers: Sit to/from Stand Sit to Stand: Total assist;+2 safety/equipment         General transfer comment: pt was preoccupied with getting off his rear end, but once stand initiated, he assisted mildly with LE's, but overall still need total assist  Ambulation/Gait             General Gait Details: unable   Stairs             Wheelchair Mobility    Modified  Rankin (Stroke Patients Only)       Balance Overall balance assessment: Needs assistance Sitting-balance support: Feet supported;Single extremity supported;Bilateral upper extremity supported Sitting balance-Leahy Scale: Poor Sitting balance - Comments: Initially pt leaning/almost pushing to the right and not wanting to draw back to midline  (suspecting buttock pain after initially wondering why).  Once at midline worked on Upright sitting/extension with scapular retraction/post pelvic tilt.     Standing balance-Leahy Scale: Zero                              Cognition Arousal/Alertness: Awake/alert Behavior During Therapy: Restless Overall Cognitive Status: Impaired/Different from baseline Area of Impairment: Attention;Following commands;Safety/judgement;Awareness;Problem solving                 Orientation Level: Time;Situation(?place) Current Attention Level: Focused   Following Commands: Follows one step commands inconsistently Safety/Judgement: Decreased awareness of safety;Decreased awareness of deficits Awareness: Intellectual Problem Solving: Slow processing;Decreased initiation;Difficulty sequencing;Requires verbal cues        Exercises      General Comments General comments (skin integrity, edema, etc.): vss for most of the session, but at some point the TC came off while on 35% FiO2 and pt's sats dropped to 75% with good waveform, quick rose back up to 85 and topped out at 91% in 2 minutes time.  Pertinent Vitals/Pain Pain Assessment: Faces Faces Pain Scale: Hurts even more Pain Location: buttock Pain Descriptors / Indicators: Discomfort;Grimacing(trying to get off his buttock) Pain Intervention(s): Monitored during session;Repositioned;Limited activity within patient's tolerance    Home Living                      Prior Function            PT Goals (current goals can now be found in the care plan section) Acute Rehab PT  Goals Patient Stated Goal: pt unable PT Goal Formulation: Patient unable to participate in goal setting Time For Goal Achievement: 01/08/20 Progress towards PT goals: Progressing toward goals(slowly)    Frequency    Min 3X/week      PT Plan Current plan remains appropriate;Frequency needs to be updated    Co-evaluation   Reason for Co-Treatment: Complexity of the patient's impairments (multi-system involvement);Necessary to address cognition/behavior during functional activity;For patient/therapist safety          AM-PAC PT "6 Clicks" Mobility   Outcome Measure  Help needed turning from your back to your side while in a flat bed without using bedrails?: Total Help needed moving from lying on your back to sitting on the side of a flat bed without using bedrails?: Total Help needed moving to and from a bed to a chair (including a wheelchair)?: Total Help needed standing up from a chair using your arms (e.g., wheelchair or bedside chair)?: Total Help needed to walk in hospital room?: Total Help needed climbing 3-5 steps with a railing? : Total 6 Click Score: 6    End of Session   Activity Tolerance: Patient tolerated treatment well;Patient limited by fatigue Patient left: in bed;with call bell/phone within reach;with bed alarm set;with SCD's reapplied Nurse Communication: Mobility status PT Visit Diagnosis: Other abnormalities of gait and mobility (R26.89);Muscle weakness (generalized) (M62.81);Difficulty in walking, not elsewhere classified (R26.2)     Time: 6063-0160 PT Time Calculation (min) (ACUTE ONLY): 29 min  Charges:  $Therapeutic Activity: 8-22 mins                     12/30/2019  Jacinto Halim., PT Acute Rehabilitation Services [phone]  (pager) [phone]  (office)   Eliseo Gum Laverna Dossett 12/30/2019, 1:30 PM

## 2019-12-30 NOTE — Consult Note (Signed)
WOC Nurse wound follow up Patient receiving care in [facility] 2M15.  PT performing hydrotherapy to sacral wound. Wound type: stage 4 Measurement: deferred, please see PT hydrotherapy notes Wound bed: primarily pink; some yellow remaining Drainage (amount, consistency, odor) yellow on existing dressing Periwound: intact Dressing procedure/placement/frequency: Continue current therapy.  When PT is ready to sign off, then perhaps twice daily saline moistened gauze. Helmut Muster, RN, MSN, CWOCN, CNS-BC, pager [phone]

## 2019-12-30 NOTE — Progress Notes (Signed)
NAME:  [name], MRN:  [id number], DOB:  March 17, 1956, LOS: 52 ADMISSION DATE:  11/30/2019, CONSULTATION DATE:  4/5 REFERRING MD:  Jerral Khaled, CHIEF COMPLAINT:  Dyspnea   Brief History   64 year old man admitted 4/2 with COVID pneumonia and delirium after 1 week of symptoms.  Intubated on 4/5, required prolonged mechanical ventilatory support.  Tracheostomy performed on 4/20  5/17 pending LTAC placement- -this is delayed due to leukocytosis, fevers and some difficult vent weaning and anxiety   Past Medical History  Laceration of left brachial artery  Hypercholesteremia HX of heart bypass GERD Type 2 diabetes CAD Asthma   Significant Hospital Events   4/19: tachypneic overnight and this am. Unable to wean sedation. Tracheostomy done  4/20: successful trach yesterday but de-recruited and on some escalated settings today. Remains tachypneic this am. Hypoglycemic yesterday and lantus changed 4/22: started on empiric abx but resp cx with abundant gpc. Improving oxygenation requirement but remains with rass -4 despite light sedation.  4/23:mucous plugging overnight and this am with copious/thick secretions.  4/28: issues overnight with increased vent settings. Still without purposeful responses. Fever overnight as well to 101.1. remains minimally responsive and unable to wean iv sedation except for short periods, despite increase in po agents.  4/29: somewhat improved settings overnight on 60/8. Still with temps overnight to 100.7 5/1: Continued improvement in vent requirement 50/8, remains on several sedating medications including continuous fentanyl and precedex. Trach changed to 6.0 shiley XLT. 5/7 tolerating pressure support ventilation while on sedation 5/16 off sedative infusions and on to trach collar. Tolerated x 3 hours 5/17 CPAP overnight. PRN APAP for fevers.  5/18 No issues overnight, placed on ATC this AM and tolerating well  5/20 Remained on ATC overnight, mild agitation noted  overnight with addition of low dose precedex drip this resolved  5/23 Precedex weaned off  Consults:    Procedures:  04/05 Rt CVC > 4/24 04/16 Bronchoscopy  04/20Tracheostomy 4/24 Rt PICC  Significant Diagnostic Tests:  4/22 CXR: 1. Lines and tubes stable position. 2. Persistent bibasilar pulmonary infiltrates/edema with interim slight clearing from prior exam. 3. Prior CABG. Heart size stable.  4/24 CXR: Slight increase in interstitial and airspace opacities particularly in the lower lobes. Findings suspicious for worsening asymmetric edema or infection.  Head CT 4/27 Mild chronic ischemic white matter disease. No acute intracranial abnormality seen.  Abdominal CT 4/27 > for PEG, trace pneumoperitoneum, extensive airspace disease due to COVID 19, kidney stone left, non-obstructing  MRI brain 5/3 > NAICP, chronic small vessel ischemia, chronic microhemorrhages, stable mild generalized atrophy, ethmoid sinus thickening, bilateral mastoid effusions  Chest CT 5/11 > 1. Pulmonary fibrosis with UIP pattern, new from 2015. 2. There is lower lobe atelectasis. No edema or convincing Pneumonia.  ECHO 5/20> LVEF 50% Mildly decreased LV function, without wall motion abnormalities. Grade I diastolic dysfunction. RV systolic function normal, normal pulmonary artery systolic pressure. RVSP 28.2 mmHgf  Micro Data:  COVID 4/2 >Positive  Blood cultures 4/2 > negative  Respiratory culture 4/5 > Negative  Respiratory culture 4/12 > Normal flora  Respiratory culture 4/21 > Normal flora  BCx 5/15> no growth 2 days Tracheal aspirate 5/14> Rare GPR-- consistent with normal respiratory flora  Sacral wound cx 5/15> pseudomonas aeruginosa   Antimicrobials:  4/21 Vancomycin > 4/23 4/21 Zosyn > 4/26, 5/19>>   Interim history/subjective:  Precedex weaned off yesterday. Remained on trach collar overnight. Afebrile with persistent tachycardia. Alert and interactive this am, complaining  of  sacral pain.     Objective   Blood pressure (!) 150/85, pulse (!) 110, temperature 97.7 F (36.5 C), temperature source Oral, resp. rate 18, height 5\' 8"  (1.727 m), weight 74.1 kg, SpO2 96 %.    FiO2 (%):  [35 %-40 %] 35 %   Intake/Output Summary (Last 24 hours) at 12/30/2019 0730 Last data filed at 12/30/2019 0700 Gross per 24 hour  Intake 2561.58 ml  Output 2825 ml  Net -263.42 ml   Filed Weights   12/28/19 0154 12/29/19 0500 12/30/19 0457  Weight: 75.9 kg 75.3 kg 74.1 kg    Examination: General: Adult male on trach collar.  HEENT:MM pink and moist. # 6 shiley trach midline   Neuro: Alert and attempts to mouth words, follows commands.   CV: No edema, ST with HR 100's on telemetry. Regular. no murmur PULM: Scattered rhonchi, symmetric expansion.  GI: ND/NT, + bowel sounds. PEG tube intact.  Extremities: Warm and well perfused Skin: Sacral decubitus ulcer.  See [facility] Problem list   COVID-19 pneumonia -Has completed full course of remdesivir and dexamethasone, off isolation 4/23.  HCAP Pneumomediastinum/pneumopericardium Urinary retention VDRF  Assessment & Plan:   Acute respiratory failure with hypoxia, s/p tracheostomy in setting of COVID-19 PNA, deconditioning Post COVID pulmonary fibrosis  -Pulmonary fibrosis with UIP pattern on chest CT 5/11 -Improving tachypnea (felt to be due to anxiety), which had impeded vent weaning/liberation previously P Tolerating T Collar. Continue, no requirements for mechanical ventilation Continue PMV as tolerated. SLP following  Toxic metabolic encephalopathy in setting of prolonged sedation, improving  Anxiety, possible critical illness related ptsd  -anxiety manifesting as increased RR, pt endorses feels of panic, anxiety, sleep disturbance  -completed clonidine taper 5/19 -Precedex weaned off 5/23 Pf Fentanyl patch in place On trazodone, BuSpar, clonazepam, Seroquel  Sacral wound with pseudomonas  aeruginosa infection  -Sacral wound positive for PSA  Unstageable sacral pressure ulcer not POA P Zosyn Day 7 of 14-day course  Continue hydrotherapy. Foley catheter for now.  Appreciate WOC  Volume overload Net + 6 L P: Continue Lasix twice a day as ordered, add metolazone   Hypernatremia P: Continue free water flushes   Goals of care:  Needs LTAC placement. Attempted to complete second peer to peer 5/19 but in conversation with [facility] informed that the approval process for the LTAC placement would need to be resubmitted or an appeal would need to be made. 5/21: Denies for long term acute care.  Patient is no longer requiring vent support at night, [place] be able to discharge to SNF. Follow up with CCM today for placement.   Best practice:  Diet: Tolerating tube feeds.  + supervised small sips and ice chips  Pain/Anxiety/Delirium protocol (if indicated): as above VAP protocol (if indicated): yes DVT prophylaxis: lovenox 40mg  daily GI prophylaxis: PPI Glucose control: Lantus+ SSI Mobility: Bed in chair Code Status: full Family Communication : Sybil Bollig, spouse. Attempted to call and update.  Disposition: Transfer to progressive.   Labs   CBC: Recent Labs  Lab 12/26/19 0433 12/27/19 0452 12/27/19 0926 12/28/19 0319 12/29/19 0328  WBC 20.1* 18.3* 19.3* 21.3* 21.8*  NEUTROABS  --   --   --  10.0*  --   HGB 8.8* 9.1* 9.2* 9.1* 9.3*  HCT 29.6* 30.6* 30.4* 30.1* 30.2*  MCV 92.5 92.7 92.7 91.5 91.0  PLT 290 305 316 286 397    Basic Metabolic Panel: Recent Labs  Lab 12/24/19 0336 12/26/19 0433 12/27/19  7322 12/28/19 0319 12/29/19 0328  NA 145 146* 144 141 143  K 3.7 3.0* 3.5 3.5 3.6  CL 103 104 105 99 101  CO2 31 31 30  32 34*  GLUCOSE 91 128* 178* 164* 145*  BUN 33* 30* 32* 30* 31*  CREATININE 0.33* 0.48* 0.49* 0.54* 0.43*  CALCIUM 8.8* 8.8* 9.0 8.9 9.1  MG  --   --   --   --  2.0   GFR: Estimated Creatinine Clearance: 89.1 mL/min (A) (by C-G formula based on SCr  of 0.43 mg/dL (L)). Recent Labs  Lab 12/27/19 0452 12/27/19 0926 12/28/19 0319 12/29/19 0328  WBC 18.3* 19.3* 21.3* 21.8*    Liver Function Tests: No results for input(s): AST, ALT, ALKPHOS, BILITOT, PROT, ALBUMIN in the last 168 hours. No results for input(s): LIPASE, AMYLASE in the last 168 hours. No results for input(s): AMMONIA in the last 168 hours.  ABG    Component Value Date/Time   PHART 7.449 12/18/2019 1232   PCO2ART 52.5 (H) 12/18/2019 1232   PO2ART 68 (L) 12/18/2019 1232   HCO3 36.4 (H) 12/18/2019 1232   TCO2 38 (H) 12/18/2019 1232   ACIDBASEDEF 1.0 11/11/2019 1529   O2SAT 94.0 12/18/2019 1232     Coagulation Profile: No results for input(s): INR, PROTIME in the last 168 hours.  Cardiac Enzymes: No results for input(s): CKTOTAL, CKMB, CKMBINDEX, TROPONINI in the last 168 hours.  HbA1C: Hgb A1c MFr Bld  Date/Time Value Ref Range Status  11/24/2019 06:51 PM 7.5 (H) 4.8 - 5.6 % Final    Comment:    (NOTE) Pre diabetes:          5.7%-6.4% Diabetes:              >6.4% Glycemic control for   <7.0% adults with diabetes   12/19/2018 02:21 PM 7.7 (H) 4.6 - 6.5 % Final    Comment:    Glycemic Control Guidelines for People with Diabetes:Non Diabetic:  <6%Goal of Therapy: <7%Additional Action Suggested:  >8%     CBG: Recent Labs  Lab 12/29/19 1145 12/29/19 1551 12/29/19 2016 12/30/19 0122 12/30/19 0448  GLUCAP 117* 167* 156* 150* 138*      01/01/20, ACNP [facility]   After hours pager: [phone]

## 2019-12-30 NOTE — Progress Notes (Signed)
  Speech Language Pathology Treatment: [name] Speaking valve  Patient Details Name: Glenn Gullickson Forinash MRN: [id number] DOB: 1956/06/15 Today's Date: 12/30/2019 Time: 1206-1217  SLP Time Calculation (min) (ACUTE ONLY): 21 min  Assessment / Plan / Recommendation Clinical Impression  PMV treatment with wife at bedside. Therapist updated wife re: assessment and results with speech and swallow this admission. Verbal education re: PMV provided and demonstrated. Pt sleepy this session after hydro therapy this morning and needed encouragement to verbalize when therapist donned valve. Evidence of patent upper airway, all vitals stable. Demonstration provided to ensure large breath/volume of air for phonation to increase intelligibility, effective for audible vocal intensity. Session was short due to this lethargy. Educated wife re: recommendation of instrumental swallow assessment when appropriate.    HPI HPI: 64 year old man admitted 4/2 with COVID pneumonia and delirium after 1 week of symptoms.  Intubated on 4/5, required prolonged mechanical ventilatory support.  Tracheostomy performed on 4/20. Transitioning to trach collar trials 5/18. Pending PEG placement for d/c to [facility].      SLP Plan  Continue with current plan of care       Recommendations         Patient [place] use Passy-Muir Speech Valve: During all therapies with supervision PMSV Supervision: Full MD: Please consider changing trach tube to : Smaller size;Cuffless         Oral Care Recommendations: Oral care QID Follow up Recommendations: LTACH;24 hour supervision/assistance SLP Visit Diagnosis: Aphonia (R49.1) Plan: Continue with current plan of care       GO                [name] 12/30/2019, 2:58 PM

## 2019-12-30 NOTE — TOC Progression Note (Signed)
Transition of Care [facility]) - Progression Note    Patient Details  Name: [name] MRN: [id number] Date of Birth: 06/16/1956  Transition of Care [facility]) CM/SW Contact  Ailany Koren, Manfred Arch, RN Phone Number: 12/30/2019, 8:37 AM  Clinical Narrative:  Appeal letter for [facility] consideration submitted by [employer] late last week.          Expected Discharge Plan and Services                                                 Social Determinants of Health (SDOH) Interventions    Readmission Risk Interventions No flowsheet data found.

## 2019-12-31 ENCOUNTER — Inpatient Hospital Stay (HOSPITAL_COMMUNITY): Payer: Medicare Other

## 2019-12-31 DIAGNOSIS — U071 COVID-19: Secondary | ICD-10-CM

## 2019-12-31 DIAGNOSIS — J9601 Acute respiratory failure with hypoxia: Secondary | ICD-10-CM

## 2019-12-31 LAB — CBC
HCT: 36.2 % — ABNORMAL LOW (ref 39.0–52.0)
Hemoglobin: 11.2 g/dL — ABNORMAL LOW (ref 13.0–17.0)
MCH: 28.1 pg (ref 26.0–34.0)
MCHC: 30.9 g/dL (ref 30.0–36.0)
MCV: 90.7 fL (ref 80.0–100.0)
Platelets: 362 10*3/uL (ref 150–400)
RBC: 3.99 MIL/uL — ABNORMAL LOW (ref 4.22–5.81)
RDW: 15.9 % — ABNORMAL HIGH (ref 11.5–15.5)
WBC: 19.8 10*3/uL — ABNORMAL HIGH (ref 4.0–10.5)
nRBC: 0 % (ref 0.0–0.2)

## 2019-12-31 LAB — GLUCOSE, CAPILLARY
Glucose-Capillary: 195 mg/dL — ABNORMAL HIGH (ref 70–99)
Glucose-Capillary: 154 mg/dL — ABNORMAL HIGH (ref 70–99)
Glucose-Capillary: 154 mg/dL — ABNORMAL HIGH (ref 70–99)
Glucose-Capillary: 166 mg/dL — ABNORMAL HIGH (ref 70–99)
Glucose-Capillary: 166 mg/dL — ABNORMAL HIGH (ref 70–99)

## 2019-12-31 LAB — BASIC METABOLIC PANEL
Anion gap: 14 (ref 5–15)
BUN: 43 mg/dL — ABNORMAL HIGH (ref 8–23)
CO2: 31 mmol/L (ref 22–32)
Calcium: 10.1 mg/dL (ref 8.9–10.3)
Chloride: 94 mmol/L — ABNORMAL LOW (ref 98–111)
Creatinine, Ser: 0.57 mg/dL — ABNORMAL LOW (ref 0.61–1.24)
GFR calc Af Amer: >60 mL/min (ref >60)
GFR calc non Af Amer: >60 mL/min (ref >60)
Glucose, Bld: 189 mg/dL — ABNORMAL HIGH (ref 70–99)
Potassium: 3.3 mmol/L — ABNORMAL LOW (ref 3.5–5.1)
Sodium: 139 mmol/L (ref 135–145)

## 2019-12-31 MED ORDER — INSULIN GLARGINE 100 UNIT/ML ~~LOC~~ SOLN
20.0000 [IU] | Freq: Two times a day (BID) | SUBCUTANEOUS | Status: DC
  Administered 2019-12-31 (×2): 20 [IU] via SUBCUTANEOUS
  Filled 2019-12-31 (×5): qty 0.2

## 2019-12-31 MED ORDER — POTASSIUM CHLORIDE 20 MEQ/15ML (10%) PO SOLN
20.0000 meq | ORAL | Status: AC
  Administered 2019-12-31 (×2): 20 meq
  Filled 2019-12-31 (×2): qty 15

## 2019-12-31 MED ORDER — SERTRALINE HCL 50 MG PO TABS
50.0000 mg | ORAL_TABLET | Freq: Every day | ORAL | Status: DC
  Administered 2019-12-31 – 2020-01-06 (×7): 50 mg via ORAL
  Filled 2019-12-31 (×7): qty 1

## 2019-12-31 MED ORDER — HALOPERIDOL LACTATE 5 MG/ML IJ SOLN
2.0000 mg | Freq: Four times a day (QID) | INTRAMUSCULAR | Status: DC | PRN
  Administered 2019-12-31 – 2020-01-03 (×4): 2 mg via INTRAVENOUS
  Filled 2019-12-31 (×5): qty 1

## 2019-12-31 MED ORDER — MAGNESIUM SULFATE 2 GM/50ML IV SOLN
2.0000 g | Freq: Once | INTRAVENOUS | Status: AC
  Administered 2019-12-31: 2 g via INTRAVENOUS
  Filled 2019-12-31: qty 50

## 2019-12-31 MED ORDER — FENTANYL 100 MCG/HR TD PT72
1.0000 | MEDICATED_PATCH | TRANSDERMAL | Status: DC
  Administered 2019-12-31 – 2020-01-06 (×3): 1 via TRANSDERMAL
  Filled 2019-12-31 (×5): qty 1

## 2019-12-31 MED ORDER — POTASSIUM CHLORIDE 10 MEQ/50ML IV SOLN
10.0000 meq | INTRAVENOUS | Status: AC
  Administered 2019-12-31 (×4): 10 meq via INTRAVENOUS
  Filled 2019-12-31: qty 50

## 2019-12-31 NOTE — Progress Notes (Signed)
Physical Therapy Wound Treatment Patient Details  Name: [name] MRN: [id number] Date of Birth: 1956/05/09  Today's Date: 12/31/2019 Time: 1023-1103 Time Calculation (min): 40 min  Subjective  Subjective: Pt with decreased assistance in turning with therapist in comparison with yesterday.  Patient and Family Stated Goals: continued progression of mobility  Date of Onset: (unknown) Prior Treatments: moist gauze and santyl   Pain Score:  Pt premedicated prior to treatment, however exhibits 10/10 facial pain score with pulse lavage.   Wound Assessment  Pressure Injury 12/12/19 Coccyx Mid Unstageable - Full thickness tissue loss in which the base of the injury is covered by slough (yellow, tan, gray, green or brown) and/or eschar (tan, brown or black) in the wound bed. (Active)  Wound Image   12/24/19 1500  Dressing Type ABD;Moist to moist;Other (Comment);Barrier Film (skin prep) 12/31/19 1200  Dressing Other (Comment);Changed 12/31/19 1200  Dressing Change Frequency Daily 12/31/19 1200  State of Healing Eschar 12/31/19 1200  Site / Wound Assessment Red;Yellow;Brown 12/31/19 1200  % Wound base Red or Granulating 15% 12/31/19 1200  % Wound base Yellow/Fibrinous Exudate 85% 12/31/19 1200  % Wound base Black/Eschar 90% 12/16/19 1000  Peri-wound Assessment Erythema (blanchable) 12/31/19 1200  Wound Length (cm) 9.2 cm 12/25/19 1400  Wound Width (cm) 9.5 cm 12/25/19 1400  Wound Depth (cm) 1.4 cm 12/25/19 1400  Wound Surface Area (cm^2) 87.4 cm^2 12/25/19 1400  Wound Volume (cm^3) 122.36 cm^3 12/25/19 1400  Margins Unattached edges (unapproximated) 12/31/19 1200  Drainage Amount Moderate 12/31/19 1200  Drainage Description Serosanguineous 12/31/19 1200  Treatment Off loading;Packing (Saline gauze);Debridement (Select[facility]);Hydrotherapy (Pulse lavage) 12/31/19 1200   Hydrotherapy Pulsed lavage therapy - wound location: sacral wound Pulsed Lavage with Suction (psi): 12 psi Pulsed Lavage  with Suction - Normal Saline Used: 1000 mL Pulsed Lavage Tip: Tip with splash shield Selective Debridement Selective Debridement - Location: sacral wound Selective Debridement - Tools Used: Forceps;Scissors;Scalpel Selective Debridement - Tissue Removed: eschar   Wound Assessment and Plan  Wound Therapy - Assess/Plan/Recommendations Wound Therapy - Clinical Statement: Wound continues to progress with proliferation of granulation tissue, and rim of wound with decreased eschar, small amount of necrotic tissue around rim of wound that will likely respond well to continued Santyl application. Given current status of wound, PT will discharge from hydrotherapy and RN staff can proceed with dressing changes and santyl to promote wound healing. [place] nurse in agreement. PT signing off.  Wound Therapy - Functional Problem List: decreased mobility  Factors Delaying/Impairing Wound Healing: Immobility;Incontinence;Multiple medical problems Hydrotherapy Plan: Debridement;Pulsatile lavage with suction;Patient/family education Wound Therapy - Frequency: 6X / week Wound Therapy - Follow Up Recommendations: Skilled nursing facility Wound Plan: see above  Wound Therapy Goals- Improve the function of patient's integumentary system by progressing the wound(s) through the phases of wound healing (inflammation - proliferation - remodeling) by: Decrease Necrotic Tissue to: 5 Decrease Necrotic Tissue - Progress: Progressing toward goal Increase Granulation Tissue to: 95 Increase Granulation Tissue - Progress: Progressing toward goal Decrease Length/Width/Depth by (cm): 10% Decrease Length/Width/Depth - Progress: Progressing toward goal Goals/treatment plan/discharge plan were made with and agreed upon by patient/family: No, Patient unable to participate in goals/treatment/discharge plan and family unavailable Time For Goal Achievement: 7 days Wound Therapy - Potential for Goals: Good  Goals will be updated until  maximal potential achieved or discharge criteria met.  Discharge criteria: when goals achieved, discharge from hospital, MD decision/surgical intervention, no progress towards goals, refusal/missing three consecutive treatments without notification or medical reason.  GP    Dani Gobble. Migdalia Dk PT, DPT Acute Rehabilitation Services Pager [phone] Office [phone]  [place] 12/31/2019, 12:38 PM

## 2019-12-31 NOTE — Progress Notes (Signed)
Spoke w/ pts wife to provide updates. Pts wife appreciative.  

## 2019-12-31 NOTE — Progress Notes (Signed)
NAME:  [name], MRN:  [id number], DOB:  04-10-1956, LOS: 53 ADMISSION DATE:  11/28/2019, CONSULTATION DATE:  4/5 REFERRING MD:  Jerral Sally, CHIEF COMPLAINT:  Dyspnea   Brief History   64 year old man admitted 4/2 with COVID pneumonia and delirium after 1 week of symptoms.  Intubated on 4/5, required prolonged mechanical ventilatory support.  Tracheostomy performed on 4/20  5/17 pending LTAC placement- -this is delayed due to leukocytosis, fevers and some difficult vent weaning and anxiety   Past Medical History  Laceration of left brachial artery  Hypercholesteremia HX of heart bypass GERD Type 2 diabetes CAD Asthma   Significant Hospital Events   4/19: tachypneic overnight and this am. Unable to wean sedation. Tracheostomy done  4/20: successful trach yesterday but de-recruited and on some escalated settings today. Remains tachypneic this am. Hypoglycemic yesterday and lantus changed 4/22: started on empiric abx but resp cx with abundant gpc. Improving oxygenation requirement but remains with rass -4 despite light sedation.  4/23:mucous plugging overnight and this am with copious/thick secretions.  4/28: issues overnight with increased vent settings. Still without purposeful responses. Fever overnight as well to 101.1. remains minimally responsive and unable to wean iv sedation except for short periods, despite increase in po agents.  4/29: somewhat improved settings overnight on 60/8. Still with temps overnight to 100.7 5/1: Continued improvement in vent requirement 50/8, remains on several sedating medications including continuous fentanyl and precedex. Trach changed to 6.0 shiley XLT. 5/7 tolerating pressure support ventilation while on sedation 5/16 off sedative infusions and on to trach collar. Tolerated x 3 hours 5/17 CPAP overnight. PRN APAP for fevers.  5/18 No issues overnight, placed on ATC this AM and tolerating well  5/20 Remained on ATC overnight, mild agitation noted  overnight with addition of low dose precedex drip this resolved  5/23 Precedex weaned off  Consults:    Procedures:  04/05 Rt CVC > 4/24 04/16 Bronchoscopy  04/20Tracheostomy 4/24 Rt PICC  Significant Diagnostic Tests:  4/22 CXR: 1. Lines and tubes stable position. 2. Persistent bibasilar pulmonary infiltrates/edema with interim slight clearing from prior exam. 3. Prior CABG. Heart size stable.  4/24 CXR: Slight increase in interstitial and airspace opacities particularly in the lower lobes. Findings suspicious for worsening asymmetric edema or infection.  Head CT 4/27 Mild chronic ischemic white matter disease. No acute intracranial abnormality seen.  Abdominal CT 4/27 > for PEG, trace pneumoperitoneum, extensive airspace disease due to COVID 19, kidney stone left, non-obstructing  MRI brain 5/3 > NAICP, chronic small vessel ischemia, chronic microhemorrhages, stable mild generalized atrophy, ethmoid sinus thickening, bilateral mastoid effusions  Chest CT 5/11 > 1. Pulmonary fibrosis with UIP pattern, new from 2015. 2. There is lower lobe atelectasis. No edema or convincing Pneumonia.  ECHO 5/20> LVEF 50% Mildly decreased LV function, without wall motion abnormalities. Grade I diastolic dysfunction. RV systolic function normal, normal pulmonary artery systolic pressure. RVSP 28.2 mmHgf  Micro Data:  COVID 4/2 >Positive  Blood cultures 4/2 > negative  Respiratory culture 4/5 > Negative  Respiratory culture 4/12 > Normal flora  Respiratory culture 4/21 > Normal flora  BCx 5/15> no growth 2 days Tracheal aspirate 5/14> Rare GPR-- consistent with normal respiratory flora  Sacral wound cx 5/15> pseudomonas aeruginosa   Antimicrobials:  4/21 Vancomycin > 4/23 4/21 Zosyn > 4/26, 5/19>>   Interim history/subjective:  Resting comfortably in bed on T Collar.  - 1 L with diuresis Alert and interactive this am.   Objective  Blood pressure 134/74, pulse (!)  116, temperature 98 F (36.7 C), temperature source Axillary, resp. rate (!) 23, height 5\' 8"  (1.727 m), weight 71 kg, SpO2 96 %.    FiO2 (%):  [35 %-40 %] 35 %   Intake/Output Summary (Last 24 hours) at 12/31/2019 0853 Last data filed at 12/31/2019 0600 Gross per 24 hour  Intake 1905.51 ml  Output 2915 ml  Net -1009.49 ml   Filed Weights   12/29/19 0500 12/30/19 0457 12/31/19 0440  Weight: 75.3 kg 74.1 kg 71 kg    Examination: General: Adult male on trach collar, resting comfortably.  HEENT:MM pink and moist. # 6 shiley trach midline   Neuro: Alert and attempts to mouth words, follows commands, no focal deficits.   CV: ST on telemetry, warm and well perfused.  PULM: Scattered rhonchi, symmetric expansion.  GI: ND/NT, + bowel sounds. PEG tube intact.  Extremities: Warm and well perfused Skin: Sacral decubitus ulcer.  See [facility]   Resolved Hospital Problem list   COVID-19 pneumonia -Has completed full course of remdesivir and dexamethasone, off isolation 4/23.  HCAP Pneumomediastinum/pneumopericardium Urinary retention VDRF  Assessment & Plan:   Acute respiratory failure with hypoxia, s/p tracheostomy in setting of COVID-19 PNA, deconditioning Post COVID pulmonary fibrosis  -Pulmonary fibrosis with UIP pattern on chest CT 5/11 -Improving tachypnea (felt to be due to anxiety), which had impeded vent weaning/liberation previously P Tolerating T Collar. Continue, no requirements for mechanical ventilation since 5/20.  Episode this am of desaturation to high 80's, improved and now SpO2 98% on 35% FiO2.  CXR pending.  Consider downsize trach pending CXR follow up and no recurrent episodes of desaturation. Continue PMV as tolerated. SLP following  Volume overload Net + 5 L, improving P: Continue diuresis   Toxic metabolic encephalopathy in setting of prolonged sedation, improving  Anxiety, possible critical illness related ptsd  -anxiety manifesting as increased RR, pt  endorses feels of panic, anxiety, sleep disturbance  -completed clonidine taper 5/19 -Precedex weaned off 5/23 P Per primary team  Sacral wound with pseudomonas aeruginosa infection  -Sacral wound positive for PSA, Zosyn per primary team   Unstageable sacral pressure ulcer not POA -wound care following with hydrotherapy   Goals of care:  Needs LTAC placement. Attempted to complete second peer to peer 5/19 but in conversation with [facility] informed that the approval process for the LTAC placement would need to be resubmitted or an appeal would need to be made. 5/21: Denies for long term acute care.  Patient is no longer requiring vent support at night, Lager be able to discharge to SNF. Follow up with CCM today for placement.  Disp: Prog status. Pulmonary will continue to follow   Labs   CBC: Recent Labs  Lab 12/27/19 0452 12/27/19 0926 12/28/19 0319 12/29/19 0328 12/31/19 0539  WBC 18.3* 19.3* 21.3* 21.8* 19.8*  NEUTROABS  --   --  10.0*  --   --   HGB 9.1* 9.2* 9.1* 9.3* 11.2*  HCT 30.6* 30.4* 30.1* 30.2* 36.2*  MCV 92.7 92.7 91.5 91.0 90.7  PLT 305 316 286 297 362    Basic Metabolic Panel: Recent Labs  Lab 12/26/19 0433 12/27/19 0452 12/28/19 0319 12/29/19 0328 12/31/19 0539  NA 146* 144 141 143 139  K 3.0* 3.5 3.5 3.6 3.3*  CL 104 105 99 101 94*  CO2 31 30 32 34* 31  GLUCOSE 128* 178* 164* 145* 189*  BUN 30* 32* 30* 31* 43*  CREATININE 0.48* 0.49*  0.54* 0.43* 0.57*  CALCIUM 8.8* 9.0 8.9 9.1 10.1  MG  --   --   --  2.0  --    GFR: Estimated Creatinine Clearance: 89.1 mL/min (A) (by C-G formula based on SCr of 0.57 mg/dL (L)). Recent Labs  Lab 12/27/19 0926 12/28/19 0319 12/29/19 0328 12/31/19 0539  WBC 19.3* 21.3* 21.8* 19.8*    Liver Function Tests: No results for input(s): AST, ALT, ALKPHOS, BILITOT, PROT, ALBUMIN in the last 168 hours. No results for input(s): LIPASE, AMYLASE in the last 168 hours. No results for input(s): AMMONIA in the last 168  hours.  ABG    Component Value Date/Time   PHART 7.449 12/18/2019 1232   PCO2ART 52.5 (H) 12/18/2019 1232   PO2ART 68 (L) 12/18/2019 1232   HCO3 36.4 (H) 12/18/2019 1232   TCO2 38 (H) 12/18/2019 1232   ACIDBASEDEF 1.0 11/11/2019 1529   O2SAT 94.0 12/18/2019 1232     Coagulation Profile: No results for input(s): INR, PROTIME in the last 168 hours.  Cardiac Enzymes: No results for input(s): CKTOTAL, CKMB, CKMBINDEX, TROPONINI in the last 168 hours.  HbA1C: Hgb A1c MFr Bld  Date/Time Value Ref Range Status  12/05/2019 06:51 PM 7.5 (H) 4.8 - 5.6 % Final    Comment:    (NOTE) Pre diabetes:          5.7%-6.4% Diabetes:              >6.4% Glycemic control for   <7.0% adults with diabetes   12/19/2018 02:21 PM 7.7 (H) 4.6 - 6.5 % Final    Comment:    Glycemic Control Guidelines for People with Diabetes:Non Diabetic:  <6%Goal of Therapy: <7%Additional Action Suggested:  >8%     CBG: Recent Labs  Lab 12/30/19 1532 12/30/19 2005 12/30/19 2344 12/31/19 0409 12/31/19 0746  GLUCAP 156* 177* 197* 195* 154*      Paulita Fujita, ACNP [facility]   After hours pager: [phone]

## 2019-12-31 NOTE — Progress Notes (Signed)
PROGRESS NOTE                                                                                                                                                                                                             Patient Demographics:    [name], is a 64 y.o. male, DOB - 09/18/1955, CEY:[id number]  Admit date - 11/13/2019   Admitting Physician Jacques Navy, MD  Outpatient Primary MD for the patient is Lorre Munroe, NP  LOS - [phone]   Chief Complaint  Patient presents with  . Respiratory Distress  . Covid Exposure       Brief Narrative    64 year old man with past medical history of hyperlipidemia, CAD status post CABG, GERD, type 2 diabetes mellitus, asthma, laceration of left brachial artery, admitted 4/2 with COVID pneumonia and delirium after 1 week of symptoms.  Intubated on 4/5, required prolonged mechanical ventilatory support.  Tracheostomy performed on 4/20, patient had multiple events during hospital stay, please see discussion below, but most notably work-up was significant for pneumomediastinum and pneumonia pericardium, are all has improved, he was liberated from the vent 5/20, tolerating trach collar, for last few days, been having significant delirium, agitation, where he required to be on Precedex, Precedex was weaned off 5/23, after he was started on multiple antipsychotic/sedative medications.   4/19: tachypneic overnight and this am. Unable to wean sedation. Tracheostomy done  4/20:successful trach yesterday but de-recruited and on some escalated settings today. Remains tachypneic this am. Hypoglycemic yesterday and lantus changed 4/22:started on empiric abx but resp cx with abundant gpc. Improving oxygenation requirement but remains with rass -4 despite light sedation.  4/23:mucous plugging overnight and this am with copious/thick secretions.  4/28: issues overnight with increased vent settings. Still without purposeful  responses. Fever overnight as well to 101.1. remains minimally responsive and unable to wean iv sedation except for short periods, despite increase in po agents.  4/29: somewhat improved settings overnight on 60/8. Still with temps overnight to 100.7 5/1: Continued improvement in vent requirement 50/8, remains on several sedating medications including continuous fentanyl and precedex. Trach changed to 6.0 shiley XLT. 5/7 tolerating pressure support ventilation while on sedation 5/16 off sedative infusions and on to trach collar. Tolerated x 3 hours 5/17 CPAP overnight. PRN APAP for fevers.  5/18 No issues  overnight, placed on ATC this AM and tolerating well  5/20 Remained on ATC overnight, mild agitation noted overnight with addition of low dose precedex drip this resolved  5/23 Precedex weaned off  Resolved Hospital Problem list   COVID-19 pneumonia -Has completed full course of remdesivir and dexamethasone, off isolation 4/23.  HCAP Pneumomediastinum/pneumopericardium Urinary retention VDRF Procedures:  04/05 Rt CVC > 4/24 04/16 Bronchoscopy  04/20Tracheostomy 4/24 Rt PICC     Subjective:    [name] today himself unable to provide any complaints, but as discussed with staff no significant events overnight .   Assessment  & Plan :    Active Problems:   HLD (hyperlipidemia)   Coronary atherosclerosis   G E R D   Hypertension   Mild intermittent asthma   Type 2 diabetes mellitus without complication, without long-term current use of insulin (HCC)   Acute respiratory distress syndrome (ARDS) due to COVID-19 virus (HCC)   Acute hypoxemic respiratory failure (HCC)   Acute encephalopathy   Status post tracheostomy (HCC)   Acute respiratory distress  Acute respiratory failure with hypoxia, in the setting of COVID-19 pneumonia. -With prolonged hospital stay, he is status post tracheostomy 4/20, currently weaned off the vent, tolerating trach collar for last few  days. 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 -Management per PCCM . -He is currently tolerating trach collar, no vent requirement since 5/20 . - Continue PMV as tolerated. SLP following -Continue with Lasix 40 mg p.o. twice daily to prevent volume overload.  Toxic metabolic encephalopathy in setting of prolonged sedation, improving  Anxiety, agitated delirium from prolonged hospital stay, possible critical illness related ptsd  -anxiety manifesting as increased RR, pt endorses feels of panic, anxiety, sleep disturbance  -completed clonidine taper 5/19 -Precedex weaned off 5/23 -Continue with current regimen which is including BuSpar, Klonopin, and Seroquel, he was started on Zoloft today as well.  Infected sacral pressure ulcer, unstageable . -Wound culture growing Pseudomonas aeruginosa, continue with IV Zosyn , day 8 of 14 days course. -Continue with Foley catheter event wound to contamination.  Hypokalemia -Repleted.     COVID-19 Labs  No results for input(s): DDIMER, FERRITIN, LDH, CRP in the last 72 hours.  Lab Results  Component Value Date   SARSCOV2NAA POSITIVE (A) 11-20-2019     Code Status : Full  Family Communication  : None at bedside  Disposition Plan  :  Status is: Inpatient  Remains inpatient appropriate because:Hemodynamically unstable, Ongoing active pain requiring inpatient pain management, Altered mental status, Unsafe d/c plan and IV treatments appropriate due to intensity of illness or inability to take PO   Dispo: The patient is from: Home              Anticipated d/c is to: LTAC              Anticipated d/c date is: > 3 days              Patient currently is not medically stable to d/c.       Consults  :  PCCM >>TRH 5/252021  DVT Prophylaxis  :  [place] lovenox  Lab Results  Component Value Date   PLT 362 12/31/2019    Antibiotics  :    Anti-infectives (From admission, onward)   Start     Dose/Rate  Route Frequency Ordered Stop   12/24/19 1000  piperacillin-tazobactam (ZOSYN) IVPB 3.375 g     3.375 g 12.5 mL/hr  over 240 Minutes Intravenous Every 8 hours 12/24/19 0908 01/03/20 1359   12/23/19 1000  Ampicillin-Sulbactam (UNASYN) 3 g in sodium chloride 0.9 % 100 mL IVPB  Status:  Discontinued     3 g 200 mL/hr over 30 Minutes Intravenous Every 8 hours 12/23/19 0914 12/23/19 0949   12/23/19 1000  doxycycline (VIBRAMYCIN) 100 mg in sodium chloride 0.9 % 250 mL IVPB  Status:  Discontinued     100 mg 125 mL/hr over 120 Minutes Intravenous Every 12 hours 12/23/19 0914 12/23/19 0949   12/22/19 1100  vancomycin (VANCOCIN) IVPB 1000 mg/200 mL premix  Status:  Discontinued     1,000 mg 200 mL/hr over 60 Minutes Intravenous Every 12 hours 12/21/19 2134 12/22/19 0930   12/21/19 2230  vancomycin (VANCOREADY) IVPB 1500 mg/300 mL     1,500 mg 150 mL/hr over 120 Minutes Intravenous  Once 12/21/19 2134 12/22/19 0026   12/21/19 2230  meropenem (MERREM) 1 g in sodium chloride 0.9 % 100 mL IVPB  Status:  Discontinued     1 g 200 mL/hr over 30 Minutes Intravenous Every 8 hours 12/21/19 2134 12/22/19 0930   12/19/19 0600  vancomycin (VANCOCIN) IVPB 1000 mg/200 mL premix     1,000 mg 200 mL/hr over 60 Minutes Intravenous To Radiology 12/18/19 1145 12/20/19 0600   11/28/19 0200  vancomycin (VANCOREADY) IVPB 1250 mg/250 mL  Status:  Discontinued     1,250 mg 166.7 mL/hr over 90 Minutes Intravenous Every 12 hours 11/27/19 1235 11/29/19 0930   11/27/19 1330  piperacillin-tazobactam (ZOSYN) IVPB 3.375 g     3.375 g 12.5 mL/hr over 240 Minutes Intravenous Every 8 hours 11/27/19 1232 12/04/19 0139   11/27/19 1330  vancomycin (VANCOREADY) IVPB 1500 mg/300 mL     1,500 mg 150 mL/hr over 120 Minutes Intravenous  Once 11/27/19 1233 11/27/19 1729   11/09/19 2000  cefTRIAXone (ROCEPHIN) 2 g in sodium chloride 0.9 % 100 mL IVPB     2 g 200 mL/hr over 30 Minutes Intravenous Every 24 hours 11/09/19 0727 11/12/19 2027    11/09/19 1000  remdesivir 100 mg in sodium chloride 0.9 % 100 mL IVPB     100 mg 200 mL/hr over 30 Minutes Intravenous Daily 05-Dec-2019 1853 11/12/19 1132   11/09/19 0800  azithromycin (ZITHROMAX) 500 mg in sodium chloride 0.9 % 250 mL IVPB     500 mg 250 mL/hr over 60 Minutes Intravenous Every 24 hours 11/09/19 0727 11/11/19 1527   2019-12-05 1945  cefTRIAXone (ROCEPHIN) 2 g in sodium chloride 0.9 % 100 mL IVPB  Status:  Discontinued     2 g 200 mL/hr over 30 Minutes Intravenous Every 24 hours 12/05/19 1940 11/09/19 0727   05-Dec-2019 1930  remdesivir 200 mg in sodium chloride 0.9% 250 mL IVPB     200 mg 580 mL/hr over 30 Minutes Intravenous Once 12/05/19 1853 2019-12-05 2204        Objective:   Vitals:   12/31/19 0800 12/31/19 0900 12/31/19 1000 12/31/19 1100  BP: 129/75 118/75 119/81   Pulse: (!) 103 (!) 113 (!) 114 (!) 113  Resp: 19 (!) 26 (!) 23 (!) 32  Temp:      TempSrc:      SpO2: 93% 90% 98% 99%  Weight:      Height:        Wt Readings from Last 3 Encounters:  12/31/19 71 kg  05/08/19 90.3 kg  04/30/19 90.3 kg     Intake/Output Summary (Last 24  hours) at 12/31/2019 1118 Last data filed at 12/31/2019 1100 Gross per 24 hour  Intake 2000.47 ml  Output 2740 ml  Net -739.53 ml     Physical Exam  Patient is awake, on trach collar, extremely frail, follows simple commands, but inconsistent, easily distracted. Symmetrical Chest wall movement, Good air movement bilaterally, scattered rhonchi RRR,No Gallops,Rubs or new Murmurs, No Parasternal Heave +ve B.Sounds, Abd Soft, No tenderness, PEG+. No Cyanosis, Clubbing or edema, No new Rash or bruise   Sacral decubitus ulcer    Data Review:    CBC Recent Labs  Lab 12/27/19 0452 12/27/19 0926 12/28/19 0319 12/29/19 0328 12/31/19 0539  WBC 18.3* 19.3* 21.3* 21.8* 19.8*  HGB 9.1* 9.2* 9.1* 9.3* 11.2*  HCT 30.6* 30.4* 30.1* 30.2* 36.2*  PLT 305 316 286 297 362  MCV 92.7 92.7 91.5 91.0 90.7  MCH 27.6 28.0 27.7 28.0  28.1  MCHC 29.7* 30.3 30.2 30.8 30.9  RDW 15.2 14.9 15.0 15.3 15.9*  LYMPHSABS  --   --  2.9  --   --   MONOABS  --   --  1.0  --   --   EOSABS  --   --  7.1*  --   --   BASOSABS  --   --  0.1  --   --     Chemistries  Recent Labs  Lab 12/26/19 0433 12/27/19 0452 12/28/19 0319 12/29/19 0328 12/31/19 0539  NA 146* 144 141 143 139  K 3.0* 3.5 3.5 3.6 3.3*  CL 104 105 99 101 94*  CO2 31 30 32 34* 31  GLUCOSE 128* 178* 164* 145* 189*  BUN 30* 32* 30* 31* 43*  CREATININE 0.48* 0.49* 0.54* 0.43* 0.57*  CALCIUM 8.8* 9.0 8.9 9.1 10.1  MG  --   --   --  2.0  --    ------------------------------------------------------------------------------------------------------------------ No results for input(s): CHOL, HDL, LDLCALC, TRIG, CHOLHDL, LDLDIRECT in the last 72 hours.  Lab Results  Component Value Date   HGBA1C 7.5 (H) 11/11/2019   ------------------------------------------------------------------------------------------------------------------ No results for input(s): TSH, T4TOTAL, T3FREE, THYROIDAB in the last 72 hours.  Invalid input(s): FREET3 ------------------------------------------------------------------------------------------------------------------ No results for input(s): VITAMINB12, FOLATE, FERRITIN, TIBC, IRON, RETICCTPCT in the last 72 hours.  Coagulation profile No results for input(s): INR, PROTIME in the last 168 hours.  No results for input(s): DDIMER in the last 72 hours.  Cardiac Enzymes No results for input(s): CKMB, TROPONINI, MYOGLOBIN in the last 168 hours.  Invalid input(s): CK ------------------------------------------------------------------------------------------------------------------    Component Value Date/Time   BNP 88.4 11/16/2019 2226    Inpatient Medications  Scheduled Meds: . aspirin  81 mg Per Tube Daily  . busPIRone  10 mg Per Tube BID  . chlorhexidine  15 mL Mouth Rinse BID  . Chlorhexidine Gluconate Cloth  6 each Topical  Q0600  . clonazePAM  0.5 mg Per Tube TID  . collagenase   Topical Daily  . enoxaparin (LOVENOX) injection  40 mg Subcutaneous Q24H  . feeding supplement (PRO-STAT SUGAR FREE 64)  30 mL Per Tube Daily  . fentaNYL  1 patch Transdermal Q72H  . fluticasone  2 spray Each Nare QHS  . free water  200 mL Per Tube Q4H  . furosemide  40 mg Per Tube BID  . gabapentin  200 mg Per Tube Q8H  . insulin aspart  0-15 Units Subcutaneous Q4H  . insulin aspart  6 Units Subcutaneous Q4H  . insulin glargine  20 Units Subcutaneous BID  .  linagliptin  5 mg Per Tube Daily  . mouth rinse  15 mL Mouth Rinse q12n4p  . multivitamin with minerals  1 tablet Per Tube Daily  . nutrition supplement (JUVEN)  1 packet Per Tube BID  . pantoprazole sodium  40 mg Per Tube Daily  . potassium chloride  20 mEq Per Tube Q4H  . QUEtiapine  100 mg Per Tube Daily  . QUEtiapine  200 mg Per Tube QHS  . sertraline  50 mg Oral QHS  . simvastatin  40 mg Per Tube QHS  . sodium chloride flush  10-40 mL Intracatheter Q12H  . thiamine  100 mg Per Tube Daily  . traZODone  100 mg Per Tube QHS   Continuous Infusions: . sodium chloride Stopped (12/30/19 2129)  . feeding supplement (VITAL AF 1.2 CAL) 75 mL/hr at 12/31/19 0600  . magnesium sulfate bolus IVPB 2 g (12/31/19 1110)  . piperacillin-tazobactam (ZOSYN)  IV 12.5 mL/hr at 12/31/19 0600  . potassium chloride 10 mEq (12/31/19 1050)   PRN Meds:.sodium chloride, acetaminophen (TYLENOL) oral liquid 160 mg/5 mL, acetaminophen, albuterol, docusate, fentaNYL (SUBLIMAZE) injection, HYDROcodone-acetaminophen, ipratropium-albuterol, nitroGLYCERIN, ondansetron (ZOFRAN) IV, polyethylene glycol, senna-docusate, sodium chloride flush  Micro Results Recent Results (from the past 240 hour(s))  Aerobic/Anaerobic Culture (surgical/deep wound)     Status: None   Collection Time: 12/21/19 12:58 PM   Specimen: Wound  Result Value Ref Range Status   Specimen Description WOUND  Final   Special  Requests SACRAL WOUND  Final   Gram Stain   Final    NO WBC SEEN FEW GRAM POSITIVE COCCI IN PAIRS FEW GRAM NEGATIVE RODS    Culture   Final    ABUNDANT PSEUDOMONAS AERUGINOSA ABUNDANT DIPHTHEROIDS(CORYNEBACTERIUM SPECIES) Standardized susceptibility testing for this organism is not available. MODERATE BACTEROIDES FRAGILIS BETA LACTAMASE POSITIVE Performed at [facility], [address], [place], [place] [address]    Report Status 12/24/2019 FINAL  Final   Organism ID, Bacteria PSEUDOMONAS AERUGINOSA  Final      Susceptibility   Pseudomonas aeruginosa - MIC*    CEFTAZIDIME 4 SENSITIVE Sensitive     CIPROFLOXACIN >=4 RESISTANT Resistant     GENTAMICIN 4 SENSITIVE Sensitive     IMIPENEM >=16 RESISTANT Resistant     PIP/TAZO <=4 SENSITIVE Sensitive     CEFEPIME 4 SENSITIVE Sensitive     * ABUNDANT PSEUDOMONAS AERUGINOSA  Culture, blood (routine x 2)     Status: None   Collection Time: 12/21/19  9:32 PM   Specimen: BLOOD  Result Value Ref Range Status   Specimen Description BLOOD LEFT ANTECUBITAL  Final   Special Requests   Final    BOTTLES DRAWN AEROBIC AND ANAEROBIC Blood Culture adequate volume   Culture   Final    NO GROWTH 5 DAYS Performed at [facility], [address], [place], [place] [address]    Report Status 12/26/2019 FINAL  Final  Culture, blood (routine x 2)     Status: None   Collection Time: 12/21/19  9:39 PM   Specimen: BLOOD LEFT HAND  Result Value Ref Range Status   Specimen Description BLOOD LEFT HAND  Final   Special Requests   Final    BOTTLES DRAWN AEROBIC AND ANAEROBIC Blood Culture adequate volume   Culture   Final    NO GROWTH 5 DAYS Performed at [facility], [address] [place], [place] [address]    Report Status 12/26/2019 FINAL  Final  Culture,  respiratory (non-expectorated)     Status: None   Collection Time: 12/24/19  3:36 AM   Specimen: Tracheal Aspirate; Respiratory  Result Value Ref Range Status   Specimen  Description TRACHEAL ASPIRATE  Final   Special Requests NONE  Final   Gram Stain   Final    ABUNDANT WBC PRESENT, PREDOMINANTLY PMN FEW GRAM POSITIVE COCCI IN CLUSTERS    Culture   Final    Consistent with normal respiratory flora. Performed at [facility], [address], [place], [place] [address]    Report Status 12/26/2019 FINAL  Final    Radiology Reports EEG  Result Date: 12/10/2019 Charlsie Quest, MD     12/10/2019  9:33 AM Patient Name: Daeshon Grammatico Klaus MRN: [id number] Epilepsy Attending: Charlsie Quest Referring Physician/Provider: Dr. Durel Salts Date: 12/09/2019 Duration: 24.34 minutes Patient history: 64 year old male with altered mental status.  EEG evaluate for seizures. Level of alertness: Comatose AEDs during EEG study: Klonopin gabapentin Technical aspects: This EEG study was done with scalp electrodes positioned according to the 10-20 International system of electrode placement. Electrical activity was acquired at a sampling rate of 500Hz  and reviewed with a high frequency filter of 70Hz  and a low frequency filter of 1Hz . EEG data were recorded continuously and digitally stored. Description: EEG showed continuous generalize 3 to 5 Hz theta-delta slowing admixed with 9 to 10 Hz generalized alpha activity.  EEG was reactive to tactile stimulation.  Hyperventilation and photic stimulation were not performed. Abnormality -Continuous slow, generalized IMPRESSION: This study is suggestive of severe diffuse encephalopathy, nonspecific to etiology. No seizures or epileptiform discharges were seen throughout the recording.   CT ABDOMEN WO CONTRAST  Result Date: 12/03/2019 CLINICAL DATA:  Admitted with COVID pneumonia. History of pneumomediastinum and pneumopericardium. Evaluate anatomy prior to potential percutaneous gastrostomy tube placement. EXAM: CT ABDOMEN WITHOUT CONTRAST TECHNIQUE: Multidetector CT imaging of the abdomen was performed following the standard  protocol without IV contrast. COMPARISON:  CT abdomen pelvis-02/18/2016; chest radiograph-11/21/2019 FINDINGS: The lack of intravenous contrast limits the ability to evaluate solid abdominal organs Lower chest: Limited visualization of the lower thorax demonstrates extensive bibasilar consolidative opacities with associated air bronchograms. No pleural effusion. Cardiomegaly. Post median sternotomy. Calcifications within native coronary arteries. No pericardial effusion. Note is made of a small amount of residual pneumopericardium (image 21 and 32, series 5). Hepatobiliary: Normal hepatic contour. Note is made of a Phrygian cap. Otherwise, normal noncontrast appearance of the gallbladder given degree distention. No radiopaque gallstones. No ascites. Pancreas: Normal noncontrast appearance of the pancreas. Spleen: Normal noncontrast appearance of the spleen. Adrenals/Urinary Tract: There is a minimal amount grossly symmetric likely age and body habitus related perinephric stranding. No evidence of urinary obstruction. There are 2 punctate nonobstructing left-sided renal stones with dominant nonobstructing stone within the anterior interpolar aspect the left kidney measuring 5 mm in diameter (image 43, series 4). No evidence of right-sided nephrolithiasis. Note is made of two hypoattenuating right-sided renal lesions, the largest of which arising from the inferior pole measures approximately 3.4 cm in diameter (image 50, series 4) and smaller exophytic lesion arising from the superior pole the right kidney measuring 2.3 cm (image 33, series 4), both of which are incompletely characterized on the present examination and while have slightly increased in size compared to remote examination performed 2017, previously, 2.7 and 1.9 cm, both have been previously characterized as renal cysts. No discrete left-sided renal lesions on this noncontrast examination. Normal noncontrast appearance  the bilateral adrenal glands. The  urinary bladder was not imaged. Stomach/Bowel: The anterior aspect of the gastric antrum is well apposed against the wall of the midline of the upper abdomen without interposition of the hepatic parenchyma or transverse colon and will likely be improved with gastric insufflation. Enteric tube tip terminates within the descending portion of the duodenum. Large colonic stool burden without evidence of enteric obstruction. Scattered minimal colonic diverticulosis without evidence acute diverticulitis on this noncontrast examination. Only the tip of the appendix is visualized, however appears normal on this noncontrast examination. There is a minimal amount of pneumoperitoneum within the midline of the upper abdomen (representative images 20, 22, 29, and 32, series 4), nonspecific though potentially the sequela of known pneumomediastinum/pneumopericardium. No pneumatosis or portal venous gas. Vascular/Lymphatic: Moderate amount of atherosclerotic plaque within a normal caliber abdominal aorta. Approximately 1.5 x 0.9 cm penetrating atherosclerotic ulcer arising from the left side of the infrarenal abdominal aorta (axial image 49, series 4; coronal image 51, series 7), is unchanged to decreased in size compared to the 2017 examination. No associated periaortic stranding. No bulky retroperitoneal or mesenteric adenopathy on this noncontrast examination Other: Minimal amount of subcutaneous edema about the bilateral flanks in midline of the lower back. Musculoskeletal: No acute or aggressive osseous abnormalities. Mild-to-moderate multilevel lumbar spine DDD, worse at L3-L4 with disc space height loss, endplate irregularity and small posteriorly directed disc osteophytosis at this location. IMPRESSION: 1. Gastric anatomy amenable to percutaneous gastrostomy tube placement as indicated. 2. Trace amount of pneumoperitoneum within the upper abdomen, nonspecific though potentially attributable to recent history of  pneumomediastinum/pneumopericardium. 3. Extensive airspace opacities within the imaged lung bases with associated air bronchograms, nonspecific though compatible with provided history of COVID-19 infection. 4. Nonobstructing left-sided nephrolithiasis. 5. Slight increase in size of previously characterized right-sided renal cysts. 6. Aortic Atherosclerosis (ICD10-I70.0). Electronically Signed   By: Simonne Come M.D.   On: 12/03/2019 11:37   CT HEAD WO CONTRAST  Result Date: 12/03/2019 CLINICAL DATA:  Encephalopathy. EXAM: CT HEAD WITHOUT CONTRAST TECHNIQUE: Contiguous axial images were obtained from the base of the skull through the vertex without intravenous contrast. COMPARISON:  None. FINDINGS: Brain: Mild chronic ischemic white matter disease is noted. No mass effect or midline shift is noted. Ventricular size is within normal limits. There is no evidence of mass lesion, hemorrhage or acute infarction. Vascular: No hyperdense vessel or unexpected calcification. Skull: Normal. Negative for fracture or focal lesion. Sinuses/Orbits: No acute finding. Other: None. IMPRESSION: Mild chronic ischemic white matter disease. No acute intracranial abnormality seen. Electronically Signed   By: Lupita Raider M.D.   On: 12/03/2019 10:24   CT CHEST WO CONTRAST  Result Date: 12/17/2019 CLINICAL DATA:  Respiratory failure EXAM: CT CHEST WITHOUT CONTRAST TECHNIQUE: Multidetector CT imaging of the chest was performed following the standard protocol without IV contrast. COMPARISON:  03/31/2014 chest CTA FINDINGS: Cardiovascular: Normal heart size. No pericardial effusion. Atherosclerosis including the coronary arteries. There has been CABG. PICC with tip at the SVC. Mediastinum/Nodes: Endotracheal tube tip in good position at the level of the upper thoracic trachea. The enteric tube at least reaches the stomach. No adenopathy Lungs/Pleura: Low volume chest with coarse interstitial opacities mainly in the subpleural and  basal lungs, with honeycombing and traction bronchiectasis. No convincing superimposed edema or pneumonia, but no recent comparison. The fibrosis is new from 2015 comparison. Negative for pleural effusion. Upper Abdomen: No acute finding Musculoskeletal: No acute finding IMPRESSION: 1. Pulmonary fibrosis with UIP  pattern, new from 2015. 2. There is lower lobe atelectasis. No edema or convincing pneumonia. Electronically Signed   By: Marnee Spring M.D.   On: 12/17/2019 06:56   MR BRAIN WO CONTRAST  Result Date: 12/09/2019 CLINICAL DATA:  Post COVID, persistent cephalopathy; additional history provided: 64 year old male admitted April 2nd with COVID pneumonia and delirium after 1 week of symptoms, eventually requiring intubation, continue to require sedation and paralytic send till April 12th for dyssynchrony. EXAM: MRI HEAD WITHOUT CONTRAST TECHNIQUE: Multiplanar, multiecho pulse sequences of the brain and surrounding structures were obtained without intravenous contrast. COMPARISON:  Noncontrast head CT 12/03/2019, brain MRI 12/28/2012 FINDINGS: Brain: Mild scattered T2/FLAIR hyperintensity within the cerebral white matter, slightly progressed as compared to brain MRI 12/28/2012. Findings are nonspecific, but most commonly seen on the basis of chronic small vessel ischemia. Stable, mild generalized parenchymal atrophy. Scattered, predominantly supratentorial chronic microhemorrhages. There is no acute infarct. No evidence of intracranial mass. No extra-axial fluid collection. No midline shift. Vascular: Expected proximal arterial flow voids. Skull and upper cervical spine: No focal marrow lesion. Sinuses/Orbits: Visualized orbits show no acute finding. Mild ethmoid sinus mucosal thickening. Bilateral mastoid effusions (greater on the left). IMPRESSION: 1. No evidence of acute intracranial abnormality. 2. Mild scattered T2 hyperintense signal changes within the cerebral white matter have slightly progressed as  compared to MRI 12/28/2012. Findings are nonspecific, but most commonly seen on the basis of chronic small vessel ischemia. 3. Scattered, predominantly supratentorial, chronic microhemorrhages which Lakey reflect sequela of chronic hypertensive microangiopathy or cerebral amyloid angiopathy. 4. Stable, mild generalized parenchymal atrophy. 5. Mild ethmoid sinus mucosal thickening. 6. Bilateral mastoid effusions. Electronically Signed   By: Jackey Loge DO   On: 12/09/2019 15:20   IR GASTROSTOMY TUBE MOD SED  Result Date: 12/24/2019 CLINICAL DATA:  Extended intubation, poor p.o. intake, needs enteral feeding support EXAM: PERC PLACEMENT GASTROSTOMY FLUOROSCOPY TIME:  102 seconds; 10 mGy TECHNIQUE: The procedure, risks, benefits, and alternatives were explained to the spouse. Questions regarding the procedure were encouraged and answered. The spouse understands and consents to the procedure. The patient was receiving adequate prophylactic antibiotic coverage as an inpatient. A safe percutaneous approach was confirmed on recent CT scan. A 5 French angiographic catheter was placed as orogastric tube. The upper abdomen was prepped with Betadine, draped in usual sterile fashion, and infiltrated locally with 1% lidocaine. Intravenous Fentanyl and Versed 2mg  were administered as conscious sedation during continuous monitoring of the patient's level of consciousness and physiological / cardiorespiratory status by the radiology RN, with a total moderate sedation time of 10 minutes. 0.5 mg glucagon given IV to facilitate gastric distention.Stomach was insufflated using air through the orogastric tube. An 68 French sheath needle was advanced percutaneously into the gastric lumen under fluoroscopy. Gas could be aspirated and a small contrast injection confirmed intraluminal spread. The sheath was exchanged over a guidewire for a 9 [place] vascular sheath, through which the snare device was advanced and used to snare a  guidewire passed through the orogastric tube. This was withdrawn, and the snare attached to the 20 French pull-through gastrostomy tube, which was advanced antegrade, positioned with the internal bumper securing the anterior gastric wall to the anterior abdominal wall. Small contrast injection confirms appropriate positioning. The external bumper was applied and the catheter was flushed. COMPLICATIONS: COMPLICATIONS none IMPRESSION: 1. Technically successful 20 French pull-through gastrostomy placement under fluoroscopy. Electronically Signed   By: Corlis Leak M.D.   On: 12/24/2019 13:19   DG  CHEST PORT 1 VIEW  Result Date: 12/31/2019 CLINICAL DATA:  Hypoxia EXAM: PORTABLE CHEST 1 VIEW COMPARISON:  12/21/2019 FINDINGS: Tracheostomy in good position and unchanged. Feeding tube is been removed. Right arm PICC tip in the SVC. Extensive bibasilar airspace disease left greater than right is unchanged. No significant pleural effusion identified. IMPRESSION: Bibasilar infiltrates unchanged. Electronically Signed   By: Marlan Palau M.D.   On: 12/31/2019 09:38   DG CHEST PORT 1 VIEW  Result Date: 12/21/2019 CLINICAL DATA:  Follow-up exam.  Respiratory failure. EXAM: PORTABLE CHEST 1 VIEW COMPARISON:  12/20/2019 and earlier studies. FINDINGS: Stable changes from prior CABG surgery. Coarse interstitial opacities in the mid and lower lungs are also unchanged consistent with interstitial fibrosis. No new lung abnormalities. Tracheostomy tube, enteric tube and right-sided PICC are stable and well positioned. IMPRESSION: 1. No interval change from the previous exam. 2. Changes consistent with interstitial fibrosis. Support apparatus is stable and well positioned. Electronically Signed   By: Amie Portland M.D.   On: 12/21/2019 09:50   DG Chest Port 1 View  Result Date: 12/20/2019 CLINICAL DATA:  Respiratory failure. EXAM: PORTABLE CHEST 1 VIEW COMPARISON:  Chest x-ray dated [place] 12, 2021. FINDINGS: Unchanged  tracheostomy and feeding tubes. Unchanged right upper extremity PICC line. Stable cardiomediastinal silhouette status post CABG. Normal pulmonary vascularity. Basilar predominant interstitial lung disease again noted. No pleural effusion or pneumothorax. No acute osseous abnormality. IMPRESSION: 1. Chronic interstitial lung disease without definite superimposed acute cardiopulmonary process. No change from prior study. Electronically Signed   By: Obie Dredge M.D.   On: 12/20/2019 07:18   DG CHEST PORT 1 VIEW  Result Date: 12/18/2019 CLINICAL DATA:  Status post bronchoscopy. EXAM: PORTABLE CHEST 1 VIEW COMPARISON:  CT chest from yesterday. Chest x-ray dated [place] 10, 2021. FINDINGS: Unchanged tracheostomy and feeding tubes. Unchanged right upper extremity PICC line. Stable cardiomediastinal silhouette status post CABG. Normal pulmonary vascularity. Basilar predominant interstitial lung disease again noted. No pleural effusion or pneumothorax. No acute osseous abnormality. IMPRESSION: Stable chest.  Pulmonary fibrosis. Electronically Signed   By: Obie Dredge M.D.   On: 12/18/2019 12:35   DG CHEST PORT 1 VIEW  Result Date: 12/16/2019 CLINICAL DATA:  Counter for hypoxia. 64 year old man with COVID pneumonia. EXAM: PORTABLE CHEST 1 VIEW COMPARISON:  12/04/2019 FINDINGS: The tracheostomy tube terminates above the carina. The right-sided PICC line is well position. The enteric tube extends below the left hemidiaphragm. There is no pneumothorax. Diffuse bilateral coarse and hazy airspace opacities are again noted. There is improvement in aeration in the right mid and right lower lung zones. There Bastien be small bilateral pleural effusions. The patient is status post prior median sternotomy. The cardiomediastinal silhouette is unchanged. IMPRESSION: 1. Lines and tubes as above. 2. Persistent but improved diffuse bilateral hazy airspace opacities are again noted. Electronically Signed   By: Katherine Mantle M.D.    On: 12/16/2019 23:56   DG CHEST PORT 1 VIEW  Result Date: 12/04/2019 CLINICAL DATA:  Fever EXAM: PORTABLE CHEST 1 VIEW COMPARISON:  11/30/2019 FINDINGS: Prior median sternotomy and CABG. Tracheostomy tube remains in place. Enteric tube courses below the diaphragm with distal tip beyond the inferior margin of the film. There has been interval removal of a right IJ central venous catheter. Interval placement of a right-sided PICC line with distal tip terminating at the level of the distal SVC. Stable mid cardiomediastinal contours. Low lung volumes. Bibasilar interstitial opacities, slightly worsened from prior. No pneumothorax is seen. External  device partially projects over the right mid lung field peripherally. IMPRESSION: 1. Bibasilar interstitial opacities, slightly worsened from prior. 2. Interval removal of right IJ CVC and placement of right-sided PICC line with distal tip at the level of the distal SVC. No pneumothorax. Electronically Signed   By: Duanne Guess D.O.   On: 12/04/2019 09:49   ECHOCARDIOGRAM COMPLETE  Result Date: 12/27/2019    ECHOCARDIOGRAM REPORT   Patient Name:   NIEVES BARBERI Philley Date of Exam: 12/26/2019 Medical Rec #:  [id number]   Height:       68.0 in Accession #:    [id number]  Weight:       177.2 lb Date of Birth:  July 20, 1956   BSA:          1.941 m Patient Age:    64 years    BP:           145/83 mmHg Patient Gender: M           HR:           81 bpm. Exam Location:  Inpatient Procedure: 2D Echo Indications:    cardiomyopathy 425.9  History:        Patient has prior history of Echocardiogram examinations, most                 recent 02/24/2013. Risk Factors:Diabetes, Dyslipidemia and                 Hypertension.  Sonographer:    Delcie Roch Referring Phys: [id number] Migdalia Dk  Sonographer Comments: Echo performed with patient supine and on artificial respirator. IMPRESSIONS  1. Left ventricular ejection fraction, by estimation, is 50%. The left ventricle has mildly  decreased function. The left ventricle has no regional wall motion abnormalities. Left ventricular diastolic parameters are consistent with Grade I diastolic dysfunction (impaired relaxation). There is Abnormal septal motion secondary to conduction delay.  2. Right ventricular systolic function is normal. The right ventricular size is normal. There is normal pulmonary artery systolic pressure. The estimated right ventricular systolic pressure is 28.2 mmHg.  3. The mitral valve is normal in structure. No evidence of mitral valve regurgitation. No evidence of mitral stenosis.  4. The aortic valve is tricuspid. Aortic valve regurgitation is not visualized. No aortic stenosis is present.  5. The inferior vena cava is dilated in size with >50% respiratory variability, suggesting right atrial pressure of 8 mmHg. FINDINGS  Left Ventricle: Left ventricular ejection fraction, by estimation, is 50%. The left ventricle has mildly decreased function. The left ventricle has no regional wall motion abnormalities. The left ventricular internal cavity size was normal in size. There is no left ventricular hypertrophy. Abnormal septal motion secondary to conduction delay. Left ventricular diastolic parameters are consistent with Grade I diastolic dysfunction (impaired relaxation). Right Ventricle: The right ventricular size is normal. No increase in right ventricular wall thickness. Right ventricular systolic function is normal. There is normal pulmonary artery systolic pressure. The tricuspid regurgitant velocity is 2.25 m/s, and  with an assumed right atrial pressure of 8 mmHg, the estimated right ventricular systolic pressure is 28.2 mmHg. Left Atrium: Left atrial size was normal in size. Right Atrium: Right atrial size was normal in size. Pericardium: There is no evidence of pericardial effusion. Mitral Valve: The mitral valve is normal in structure. Normal mobility of the mitral valve leaflets. No evidence of mitral valve  regurgitation. No evidence of mitral valve stenosis. Tricuspid Valve: The tricuspid valve is normal in structure.  Tricuspid valve regurgitation is not demonstrated. No evidence of tricuspid stenosis. Aortic Valve: The aortic valve is tricuspid. . There is mild thickening and mild calcification of the aortic valve. Aortic valve regurgitation is not visualized. No aortic stenosis is present. There is mild thickening of the aortic valve. There is mild calcification of the aortic valve. Pulmonic Valve: The pulmonic valve was normal in structure. Pulmonic valve regurgitation is trivial. No evidence of pulmonic stenosis. Aorta: The aortic root is normal in size and structure. Venous: The inferior vena cava is dilated in size with greater than 50% respiratory variability, suggesting right atrial pressure of 8 mmHg. IAS/Shunts: The interatrial septum was not well visualized.  LEFT VENTRICLE PLAX 2D LVIDd:         5.10 cm  Diastology LVIDs:         3.50 cm  LV e' lateral:   10.60 cm/s LV PW:         0.90 cm  LV E/e' lateral: 6.7 LV IVS:        0.70 cm  LV e' medial:    7.29 cm/s LVOT diam:     2.20 cm  LV E/e' medial:  9.7 LV SV:         94 LV SV Index:   48 LVOT Area:     3.80 cm  RIGHT VENTRICLE RV S prime:     8.70 cm/s TAPSE (M-mode): 1.2 cm LEFT ATRIUM             Index       RIGHT ATRIUM          Index LA diam:        3.70 cm 1.91 cm/m  RA Area:     9.34 cm LA Vol (A2C):   45.3 ml 23.33 ml/m RA Volume:   16.90 ml 8.70 ml/m LA Vol (A4C):   35.4 ml 18.23 ml/m LA Biplane Vol: 40.7 ml 20.96 ml/m  AORTIC VALVE LVOT Vmax:   117.00 cm/s LVOT Vmean:  87.000 cm/s LVOT VTI:    0.247 m  AORTA Ao Root diam: 3.60 cm MITRAL VALVE               TRICUSPID VALVE MV Area (PHT): 3.53 cm    TR Peak grad:   20.2 mmHg MV Decel Time: 215 msec    TR Vmax:        225.00 cm/s MV E velocity: 70.70 cm/s MV A velocity: 77.10 cm/s  SHUNTS MV E/A ratio:  0.92        Systemic VTI:  0.25 m                            Systemic Diam: 2.20 cm  Weston Brass MD Electronically signed by Weston Brass MD Signature Date/Time: 12/27/2019/12:05:36 AM    Final      Huey Bienenstock M.D on 12/31/2019 at 11:18 AM  Between 7am to 7pm - Pager - [phone]  After 7pm go to [email or web address] - password [facility]  [facility] -  Office  [phone]

## 2019-12-31 NOTE — Consult Note (Signed)
WOC Nurse wound follow up Patient receiving care in [facility] 2M15.  Assisted with turning and positioning for buttock and sacral assessment by primary RN. Wound type: Stage 4 sacral/coccyx wound.  Many areas of skin stripped by patient scratching his buttocks, posterior upper thighs, knees. Measurement: for sacral wound characteristics, see PT note. Wound bed: Drainage (amount, consistency, odor)  Periwound: Dressing procedure/placement/frequency: PT hydrotherapy discontinued.  Topical therapy ordered for performance by primary RNs: Place saline moistened gauze into the sacral wound, cover with ABD pads. Tape in place. Perform twice daily and prn soilage. I have also ordered 6 x daily cleansing and Criticaid Clear with antifungal powder to areas scratched by patient. Monitor the wound area(s) for worsening of condition such as: Signs/symptoms of infection,  Increase in size,  Development of or worsening of odor, Development of pain, or increased pain at the affected locations.  Notify the medical team if any of these develop. Helmut Muster, RN, MSN, CWOCN, CNS-BC, pager [phone]

## 2019-12-31 NOTE — Progress Notes (Signed)
  Speech Language Pathology Treatment: [name] Speaking valve  Patient Details Name: [name] MRN: [id number] DOB: May 12, 1956 Today's Date: 12/31/2019 Time: 1211-1222 SLP Time Calculation (min) (ACUTE ONLY): 11 min  Assessment / Plan / Recommendation Clinical Impression  Pt restless, impulsive today with repetitive verbal cues needed to attend and follow instructions.  Cuff deflated at baseline.  PMV placed - pt with excellent toleration for ten minute period with good upper airway access, no backflow upon intermittent removal.  VS remained stable with valve in place.  Pt muttering unintelligibly, required cues to repeat utterances with improved respiratory support/volume - he had difficulty following through with instructions. Stated name, DOB, year as 1926 despite multiple choice. Recommend RN/staff/therapy place valve when in room with pt to encourage verbal interaction.  SLP will follow for readiness for an instrumental swallow assessment.  MS today did not allow it.  Will follow.    HPI HPI: 64 year old man admitted 4/2 with COVID pneumonia and delirium after 1 week of symptoms.  Intubated on 4/5, required prolonged mechanical ventilatory support.  Tracheostomy performed on 4/20. Transitioning to trach collar trials 5/18. Has PEG.      SLP Plan  Continue with current plan of care       Recommendations   PMV with full supervision                Plan: Continue with current plan of care       GO               Laurier Jasperson L. Samson Frederic, MA CCC/SLP Acute Rehabilitation Services Office number [phone] Pager [phone]  Carolan Shiver 12/31/2019, 12:57 PM

## 2019-12-31 NOTE — Progress Notes (Signed)
Pt extremely agitated. Pain meds do not seem to be providing comfort/relief.  Messaged Hospitalist : PRN med for agitation.

## 2019-12-31 NOTE — Progress Notes (Signed)
Pts SpO2 dropped to mid 80s, good pleth, tested on multiple sites.  Pt resting in bed, in no apparent distress.  Suctioned multiple times and lavaged w/ minimal secretions, no improvement.  Briefly bagged patient and repositioned in bed.  Pts SpO2 now in mid 90s.  CCM aware.  CXR ordered.

## 2019-12-31 NOTE — Progress Notes (Signed)
K+3.3 ?Replaced per protocol  ?

## 2019-12-31 NOTE — Progress Notes (Signed)
Nutrition Follow-up  DOCUMENTATION CODES:   Severe malnutrition in context of acute illness/injury  INTERVENTION:   Continue TF via PEG:  Vital AF 1.2 at 75 ml/h (1800 ml per day) Pro-stat 30 ml once daily Free water flushes 200 ml every 4 h  Provides 2260 kcal, 150 gm protein, 2660 ml free water daily  Juven 1 packet via tube BID, each packet provides 80 calories, 8 grams of carbohydrate, 2.5  grams of protein (collagen), 7 grams of L-arginine and 7 grams of L-glutamine; supplement contains CaHMB, Vitamins C, E, B12 and Zinc to promote wound healing   NUTRITION DIAGNOSIS:   Severe Malnutrition related to acute illness(COVID-19) as evidenced by severe muscle depletion, percent weight loss(21% weight loss within a year).  Ongoing   GOAL:   Patient will meet greater than or equal to 90% of their needs  Met with TF  MONITOR:   Vent status, Labs, Weight trends, TF tolerance, Skin  REASON FOR ASSESSMENT:   Consult Wound healing  ASSESSMENT:   64 yo male with ARDS secondary to COVID-19 pnuemonia requiring intubation, acute encephalopathy with hx of heavy EtOH abuse, AKI. PMH includes DM, HTN, CAD s/p PCI/CABG  Discussed patient in ICU rounds and with RN today.  Currently on trach collar.  Patient is receiving hydrotherapy to sacral wounds.   S/P PEG placement 5/18. Currently receiving Vital AF 1.2 at 75 ml/h with Pro-stat 30 ml daily and Juven BID. He has been tolerating TF well. He is receiving 2 gm/kg protein and Juven BID to help support wound healing. Receiving 200 ml free water flushes every 4 hours.  Labs reviewed. K 3.3 (L), BUN 43 (H), Creat 0.57 (L) CBG: 195-154  Medications reviewed and include lasix, novolog, lantus, tradjenta, MVI with minerals, Juven, potassium chloride, thiamine.  Weight 71 kg today I/O +7 L since admission  Patient has lost 21% of his usual weight within the past 10 months. He has moderate fat depletion and severe muscle depletion.  Meets criteria for severe malnutrition.   Nutrition Focused Physical Exam:    Most Recent Value  Orbital Region  Moderate depletion  Upper Arm Region  Moderate depletion  Thoracic and Lumbar Region  Moderate depletion  Buccal Region  Moderate depletion  Temple Region  Moderate depletion  Clavicle Bone Region  Moderate depletion  Clavicle and Acromion Bone Region  Moderate depletion  Scapular Bone Region  Unable to assess  Dorsal Hand  Unable to assess  Patellar Region  Severe depletion  Anterior Thigh Region  Severe depletion  Posterior Calf Region  Severe depletion  Edema (RD Assessment)  None  Hair  Reviewed  Eyes  Unable to assess  Mouth  Unable to assess  Skin  Reviewed  Nails  Reviewed      Diet Order:   Diet Order            Diet NPO time specified Except for: Ice Chips  Diet effective now              EDUCATION NEEDS:   Not appropriate for education at this time   Skin:  Skin Assessment: Skin Integrity Issues: Skin Integrity Issues:: Unstageable DTI: N/A Unstageable: coccyx & bilateral buttocks  Last BM:  5/25 type 5  Height:   Ht Readings from Last 1 Encounters:  11/29/19 _0  (1.727 m)    Weight:   Wt Readings from Last 1 Encounters:  12/31/19 71 kg    BMI:  Body mass index is 23.8 kg/m.  Estimated Nutritional Needs:   Kcal:  2200-2400  Protein:  135-160 gm  Fluid:  >/= 2.2 L    Lucas Mallow, RD, LDN, CNSC Please refer to Amion for contact information.

## 2020-01-01 DIAGNOSIS — E43 Unspecified severe protein-calorie malnutrition: Secondary | ICD-10-CM | POA: Insufficient documentation

## 2020-01-01 DIAGNOSIS — J9601 Acute respiratory failure with hypoxia: Secondary | ICD-10-CM

## 2020-01-01 LAB — GLUCOSE, CAPILLARY
Glucose-Capillary: 157 mg/dL — ABNORMAL HIGH (ref 70–99)
Glucose-Capillary: 161 mg/dL — ABNORMAL HIGH (ref 70–99)
Glucose-Capillary: 188 mg/dL — ABNORMAL HIGH (ref 70–99)
Glucose-Capillary: 192 mg/dL — ABNORMAL HIGH (ref 70–99)
Glucose-Capillary: 198 mg/dL — ABNORMAL HIGH (ref 70–99)
Glucose-Capillary: 209 mg/dL — ABNORMAL HIGH (ref 70–99)

## 2020-01-01 LAB — CBC
HCT: 34.6 % — ABNORMAL LOW (ref 39.0–52.0)
Hemoglobin: 10.6 g/dL — ABNORMAL LOW (ref 13.0–17.0)
MCH: 27.4 pg (ref 26.0–34.0)
MCHC: 30.6 g/dL (ref 30.0–36.0)
MCV: 89.4 fL (ref 80.0–100.0)
Platelets: 332 10*3/uL (ref 150–400)
RBC: 3.87 MIL/uL — ABNORMAL LOW (ref 4.22–5.81)
RDW: 15.9 % — ABNORMAL HIGH (ref 11.5–15.5)
WBC: 21.1 10*3/uL — ABNORMAL HIGH (ref 4.0–10.5)
nRBC: 0 % (ref 0.0–0.2)

## 2020-01-01 LAB — BASIC METABOLIC PANEL
Anion gap: 12 (ref 5–15)
BUN: 50 mg/dL — ABNORMAL HIGH (ref 8–23)
CO2: 35 mmol/L — ABNORMAL HIGH (ref 22–32)
Calcium: 10 mg/dL (ref 8.9–10.3)
Chloride: 89 mmol/L — ABNORMAL LOW (ref 98–111)
Creatinine, Ser: 0.56 mg/dL — ABNORMAL LOW (ref 0.61–1.24)
GFR calc Af Amer: >60 mL/min (ref >60)
GFR calc non Af Amer: >60 mL/min (ref >60)
Glucose, Bld: 164 mg/dL — ABNORMAL HIGH (ref 70–99)
Potassium: 3.4 mmol/L — ABNORMAL LOW (ref 3.5–5.1)
Sodium: 136 mmol/L (ref 135–145)

## 2020-01-01 LAB — MAGNESIUM: Magnesium: 2.1 mg/dL (ref 1.7–2.4)

## 2020-01-01 MED ORDER — POTASSIUM CHLORIDE 20 MEQ/15ML (10%) PO SOLN
40.0000 meq | Freq: Once | ORAL | Status: AC
  Administered 2020-01-01: 40 meq
  Filled 2020-01-01: qty 30

## 2020-01-01 MED ORDER — POTASSIUM CHLORIDE 20 MEQ/15ML (10%) PO SOLN
40.0000 meq | Freq: Once | ORAL | Status: AC
  Administered 2020-01-01: 40 meq via ORAL
  Filled 2020-01-01: qty 30

## 2020-01-01 MED ORDER — CLONAZEPAM 0.25 MG PO TBDP
0.2500 mg | ORAL_TABLET | Freq: Two times a day (BID) | ORAL | Status: DC
  Administered 2020-01-01 – 2020-01-07 (×12): 0.25 mg
  Filled 2020-01-01 (×12): qty 1

## 2020-01-01 MED ORDER — FUROSEMIDE 40 MG PO TABS
40.0000 mg | ORAL_TABLET | Freq: Every day | ORAL | Status: DC
  Administered 2020-01-02 – 2020-01-03 (×2): 40 mg
  Filled 2020-01-01 (×2): qty 1

## 2020-01-01 MED ORDER — FREE WATER
200.0000 mL | Freq: Three times a day (TID) | Status: DC
  Administered 2020-01-01 – 2020-01-07 (×18): 200 mL

## 2020-01-01 MED ORDER — INSULIN GLARGINE 100 UNIT/ML ~~LOC~~ SOLN
25.0000 [IU] | Freq: Two times a day (BID) | SUBCUTANEOUS | Status: DC
  Administered 2020-01-01 – 2020-01-07 (×13): 25 [IU] via SUBCUTANEOUS
  Filled 2020-01-01 (×14): qty 0.25

## 2020-01-01 NOTE — Progress Notes (Signed)
K+ 3.4 Replaced per protocol  

## 2020-01-01 NOTE — Progress Notes (Signed)
Received to room 36 from 66M via bed. Positioned for comfort. Oriented to room, bed and unit. Restless but cooperative.

## 2020-01-01 NOTE — Progress Notes (Signed)
NAME:  [name], MRN:  [id number], DOB:  11/24/1955, LOS: 54 ADMISSION DATE:  11/15/2019, CONSULTATION DATE:  4/5 REFERRING MD:  Jerral Canon, CHIEF COMPLAINT:  Dyspnea   Brief History   64 year old man admitted 4/2 with COVID pneumonia and delirium after 1 week of symptoms.  Intubated on 4/5, required prolonged mechanical ventilatory support.  Tracheostomy performed on 4/20  5/17 pending LTAC placement- -this is delayed due to leukocytosis, fevers and some difficult vent weaning and anxiety   Past Medical History  Laceration of left brachial artery  Hypercholesteremia HX of heart bypass GERD Type 2 diabetes CAD Asthma   Significant Hospital Events   4/19: tachypneic overnight and this am. Unable to wean sedation. Tracheostomy done  4/20: successful trach yesterday but de-recruited and on some escalated settings today. Remains tachypneic this am. Hypoglycemic yesterday and lantus changed 4/22: started on empiric abx but resp cx with abundant gpc. Improving oxygenation requirement but remains with rass -4 despite light sedation.  4/23:mucous plugging overnight and this am with copious/thick secretions.  4/28: issues overnight with increased vent settings. Still without purposeful responses. Fever overnight as well to 101.1. remains minimally responsive and unable to wean iv sedation except for short periods, despite increase in po agents.  4/29: somewhat improved settings overnight on 60/8. Still with temps overnight to 100.7 5/1: Continued improvement in vent requirement 50/8, remains on several sedating medications including continuous fentanyl and precedex. Trach changed to 6.0 shiley XLT. 5/7 tolerating pressure support ventilation while on sedation 5/16 off sedative infusions and on to trach collar. Tolerated x 3 hours 5/17 CPAP overnight. PRN APAP for fevers.  5/18 No issues overnight, placed on ATC this AM and tolerating well  5/20 Remained on ATC overnight, mild agitation noted  overnight with addition of low dose precedex drip this resolved  5/23 Precedex weaned off  Consults:    Procedures:  04/05 Rt CVC > 4/24 04/16 Bronchoscopy  04/20Tracheostomy 4/24 Rt PICC  Significant Diagnostic Tests:  4/22 CXR: 1. Lines and tubes stable position. 2. Persistent bibasilar pulmonary infiltrates/edema with interim slight clearing from prior exam. 3. Prior CABG. Heart size stable.  4/24 CXR: Slight increase in interstitial and airspace opacities particularly in the lower lobes. Findings suspicious for worsening asymmetric edema or infection.  Head CT 4/27 Mild chronic ischemic white matter disease. No acute intracranial abnormality seen.  Abdominal CT 4/27 > for PEG, trace pneumoperitoneum, extensive airspace disease due to COVID 19, kidney stone left, non-obstructing  MRI brain 5/3 > NAICP, chronic small vessel ischemia, chronic microhemorrhages, stable mild generalized atrophy, ethmoid sinus thickening, bilateral mastoid effusions  Chest CT 5/11 > 1. Pulmonary fibrosis with UIP pattern, new from 2015. 2. There is lower lobe atelectasis. No edema or convincing Pneumonia.  ECHO 5/20> LVEF 50% Mildly decreased LV function, without wall motion abnormalities. Grade I diastolic dysfunction. RV systolic function normal, normal pulmonary artery systolic pressure. RVSP 28.2 mmHgf  Micro Data:  COVID 4/2 >Positive  Blood cultures 4/2 > negative  Respiratory culture 4/5 > Negative  Respiratory culture 4/12 > Normal flora  Respiratory culture 4/21 > Normal flora  BCx 5/15> no growth 2 days Tracheal aspirate 5/14> Rare GPR-- consistent with normal respiratory flora  Sacral wound cx 5/15> pseudomonas aeruginosa   Antimicrobials:  4/21 Vancomycin > 4/23 4/21 Zosyn > 4/26, 5/19>>   Interim history/subjective:  No events. Resting comfortably in bed. Remains on TC. No issues per nursing.  Objective   Blood pressure 136/82, pulse Marland Kitchen)  102, temperature  97.8 F (36.6 C), temperature source Oral, resp. rate 19, height 5\' 8"  (1.727 m), weight 73.1 kg, SpO2 97 %.    FiO2 (%):  [35 %-40 %] 40 %   Intake/Output Summary (Last 24 hours) at 01/01/2020 5329 Last data filed at 01/01/2020 0849 Gross per 24 hour  Intake 2746.85 ml  Output 2345 ml  Net 401.85 ml   Filed Weights   12/30/19 0457 12/31/19 0440 01/01/20 0500  Weight: 74.1 kg 71 kg 73.1 kg    Examination: GEN: frail man on TC HEENT: trach in place (shiley distal cuffed XLT 6-0) CV: RRR, ext warm PULM: Scattered rhonci, no wheezing, no accessory muscle use GI: PEG in place, soft EXT: Muscle wasting, trace edema NEURO: profoundly weak, moves all 4 ext PSYCH:  RASS 0 SKIN: Multiple areas of skin breakdown  WBC stable Cr stable K repelted Net even   Resolved Hospital Problem list   COVID-19 pneumonia -Has completed full course of remdesivir and dexamethasone, off isolation 4/23.  HCAP Pneumomediastinum/pneumopericardium Urinary retention VDRF  Assessment & Plan:   Acute respiratory failure with hypoxia, s/p tracheostomy in setting of COVID-19 PNA, deconditioning Post COVID pulmonary fibrosis  -Pulmonary fibrosis with UIP pattern on chest CT 5/11 -Improving tachypnea (felt to be due to anxiety), which had impeded vent weaning/liberation previously P - Downsize to 4-0 cuffless - Consider capping as early as tomorrow - Start dropping sedation: dc trazadone, drop klonipin - Continue aggressive PT  Volume overload, hyponatremia- improving P: Drop lasix PO to daily and decrease free water   Goals of care:  Work toward SNF.  Disp: Prog status. Pulmonary will continue to follow for trach wean    Erskine Emery MD [facility] Pulmonary Critical Care 01/01/2020 9:46 AM Personal pager: [phone] If unanswered, please page CCM On-call: [phone]

## 2020-01-01 NOTE — NC FL2 (Signed)
[place] LEVEL OF CARE SCREENING TOOL     IDENTIFICATION  Patient Name: [name] Birthdate: [birth year] Sex: male Admission Date (Current Location): 12/05/2019  [facility] and [place] Number:  Herbalist and Address:  [facility] [place],  [address], [place], [place] [address]      Provider Number: [id number]  Attending Physician Name and Address:  Cristal Ford, DO  Relative Name and Phone Number:       Current Level of Care: Hospital Recommended Level of Care: [place] Prior Approval Number:    Date Approved/Denied:   PASRR Number:    Discharge Plan: SNF    Current Diagnoses: Patient Active Problem List   Diagnosis Date Noted  . Acute respiratory distress   . Status post tracheostomy ([place])   . Acute encephalopathy   . Acute hypoxemic respiratory failure ([place])   . Acute respiratory distress syndrome (ARDS) due to COVID-19 virus ([place]) 12/01/2019  . Allergy to insect bites 01/04/2019  . Type 2 diabetes mellitus without complication, without long-term current use of insulin ([place]) 10/25/2017  . Arthritis 07/25/2014  . Mild intermittent asthma 07/18/2014  . Insomnia 03/16/2014  . Hypertension 02/23/2013  . Seasonal and perennial allergic rhinitis 08/27/2007  . G E R D 08/27/2007  . HLD (hyperlipidemia)   . Coronary atherosclerosis     Orientation RESPIRATION BLADDER Height & Weight        Vent, Tracheostomy Incontinent Weight: 161 lb 2.5 oz (73.1 kg) Height:  5\' 8"  (172.7 cm)  BEHAVIORAL SYMPTOMS/MOOD NEUROLOGICAL BOWEL NUTRITION STATUS      Incontinent Feeding tube  AMBULATORY STATUS COMMUNICATION OF NEEDS Skin   Extensive Assist Non-Verbally PU Stage and Appropriate Care                       Personal Care Assistance Level of Assistance  Bathing, Feeding Bathing Assistance: Maximum assistance Feeding assistance: Maximum assistance       Functional Limitations Info  Sight, Speech,  Hearing Sight Info: Adequate Hearing Info: Adequate Speech Info: Impaired(Patient currently intubated with trach)    SPECIAL CARE FACTORS FREQUENCY  PT (By licensed PT), OT (By licensed OT)     PT Frequency: 5x weekly OT Frequency: 5x weekly            Contractures Contractures Info: Not present    Additional Factors Info  Code Status, Psychotropic Code Status Info: Full Code   Psychotropic Info: Zoloft, Buspar, Klonapin, Seroquel         Current Medications (01/01/2020):  This is the current hospital active medication list Current Facility-Administered Medications  Medication Dose Route Frequency Provider Last Rate Last Admin  . 0.9 %  sodium chloride infusion   Intravenous PRN Rigoberto Noel, MD   Stopped at 12/30/19 2129  . acetaminophen (TYLENOL) 160 MG/5ML solution 650 mg  650 mg Per Tube Q6H PRN Neena Rhymes, MD   650 mg at 12/29/19 1944  . acetaminophen (TYLENOL) tablet 650 mg  650 mg Oral Q6H PRN Norins, Heinz Knuckles, MD      . albuterol (PROVENTIL) (2.5 MG/3ML) 0.083% nebulizer solution 2.5 mg  2.5 mg Nebulization Q2H PRN Kipp Brood, MD   2.5 mg at 11/23/19 1241  . aspirin chewable tablet 81 mg  81 mg Per Tube Daily Norins, Heinz Knuckles, MD   81 mg at 01/01/20 0840  . busPIRone (BUSPAR) tablet 10 mg  10 mg Per Tube BID Assunta Curtis  K, RPH   10 mg at 01/01/20 0901  . chlorhexidine (PERIDEX) 0.12 % solution 15 mL  15 mL Mouth Rinse BID Leslye Peer, MD   15 mL at 01/01/20 0850  . Chlorhexidine Gluconate Cloth 2 % PADS 6 each  6 each Topical Q0600 Lupita Leash, MD   6 each at 01/01/20 0450  . clonazePAM (KLONOPIN) disintegrating tablet 0.25 mg  0.25 mg Per Tube BID Lorin Glass, MD      . docusate (COLACE) 50 MG/5ML liquid 100 mg  100 mg Per Tube BID PRN Oretha Milch, MD   100 mg at 12/05/19 2156  . enoxaparin (LOVENOX) injection 40 mg  40 mg Subcutaneous Q24H Max Fickle B, MD   40 mg at 01/01/20 0055  . feeding supplement (PRO-STAT SUGAR FREE 64)  liquid 30 mL  30 mL Per Tube Daily Briant Sites, DO   30 mL at 01/01/20 0844  . feeding supplement (VITAL AF 1.2 CAL) liquid 1,000 mL  1,000 mL Per Tube Continuous Lupita Leash, MD 75 mL/hr at 01/01/20 1400 Rate Verify at 01/01/20 1400  . fentaNYL (DURAGESIC) 100 MCG/HR 1 patch  1 patch Transdermal Q72H Lorin Glass, MD   1 patch at 12/31/19 1212  . fentaNYL (SUBLIMAZE) injection 100 mcg  100 mcg Intravenous Q4H PRN Raymon Mutton F, NP   100 mcg at 01/01/20 0523  . fluticasone (FLONASE) 50 MCG/ACT nasal spray 2 spray  2 spray Each Nare QHS Norins, Rosalyn Gess, MD   2 spray at 12/30/19 2113  . free water 200 mL  200 mL Per Tube Q8H Lorin Glass, MD   200 mL at 01/01/20 1429  . [START ON 01/02/2020] furosemide (LASIX) tablet 40 mg  40 mg Per Tube Daily Lorin Glass, MD      . gabapentin (NEURONTIN) 250 MG/5ML solution 200 mg  200 mg Per Tube Q8H Icard, Bradley L, DO   200 mg at 01/01/20 1429  . haloperidol lactate (HALDOL) injection 2 mg  2 mg Intravenous Q6H PRN Elgergawy, Leana Roe, MD   2 mg at 12/31/19 1751  . HYDROcodone-acetaminophen (NORCO/VICODIN) 5-325 MG per tablet 1-2 tablet  1-2 tablet Oral Q4H PRN Oley Balm, MD   1 tablet at 01/01/20 [phone]  . insulin aspart (novoLOG) injection 0-15 Units  0-15 Units Subcutaneous Q4H Olalere, Adewale A, MD   3 Units at 01/01/20 1247  . insulin aspart (novoLOG) injection 6 Units  6 Units Subcutaneous Q4H Charlott Holler, MD   6 Units at 01/01/20 1247  . insulin glargine (LANTUS) injection 25 Units  25 Units Subcutaneous BID Lorin Glass, MD   25 Units at 01/01/20 1021  . ipratropium-albuterol (DUONEB) 0.5-2.5 (3) MG/3ML nebulizer solution 3 mL  3 mL Nebulization Q4H PRN Briant Sites, DO   3 mL at 12/14/19 1116  . linagliptin (TRADJENTA) tablet 5 mg  5 mg Per Tube Daily Norins, Rosalyn Gess, MD   5 mg at 01/01/20 0900  . MEDLINE mouth rinse  15 mL Mouth Rinse q12n4p Leslye Peer, MD   15 mL at 01/01/20 1248  . multivitamin with  minerals tablet 1 tablet  1 tablet Per Tube Daily Oretha Milch, MD   1 tablet at 01/01/20 0841  . nitroGLYCERIN (NITROSTAT) SL tablet 0.4 mg  0.4 mg Sublingual Q5 min PRN Norins, Rosalyn Gess, MD      . nutrition supplement (JUVEN) (JUVEN) powder packet 1 packet  1 packet Per Tube  BID Lupita Leash, MD   1 packet at 01/01/20 [phone]  . ondansetron (ZOFRAN) injection 4 mg  4 mg Intravenous Q4H PRN Oley Balm, MD      . pantoprazole sodium (PROTONIX) 40 mg/20 mL oral suspension 40 mg  40 mg Per Tube Daily Norins, Rosalyn Gess, MD   40 mg at 01/01/20 0900  . piperacillin-tazobactam (ZOSYN) IVPB 3.375 g  3.375 g Intravenous Q8H ClarkVernona Rieger P, DO 12.5 mL/hr at 01/01/20 1428 3.375 g at 01/01/20 1428  . polyethylene glycol (MIRALAX / GLYCOLAX) packet 17 g  17 g Per Tube Daily PRN Briant Sites, DO      . QUEtiapine (SEROQUEL) tablet 100 mg  100 mg Per Tube Daily Mannam, Praveen, MD   100 mg at 01/01/20 0855  . QUEtiapine (SEROQUEL) tablet 200 mg  200 mg Per Tube QHS Leslye Peer, MD   200 mg at 12/31/19 2146  . senna-docusate (Senokot-S) tablet 1 tablet  1 tablet Per Tube QHS PRN Briant Sites, DO   1 tablet at 12/05/19 2200  . sertraline (ZOLOFT) tablet 50 mg  50 mg Oral QHS Lorin Glass, MD   50 mg at 12/31/19 2147  . simvastatin (ZOCOR) tablet 40 mg  40 mg Per Tube QHS Jacques Navy, MD   40 mg at 12/31/19 2146  . sodium chloride flush (NS) 0.9 % injection 10-40 mL  10-40 mL Intracatheter Q12H Briant Sites, DO   10 mL at 01/01/20 0849  . sodium chloride flush (NS) 0.9 % injection 10-40 mL  10-40 mL Intracatheter PRN Briant Sites, DO      . thiamine tablet 100 mg  100 mg Per Tube Daily Selmer Dominion B, NP   100 mg at 01/01/20 1962     Discharge Medications: Please see discharge summary for a list of discharge medications.  Relevant Imaging Results:  Relevant Lab Results:   Additional Information SSN: [id number]  Inis Sizer, LCSW

## 2020-01-01 NOTE — Progress Notes (Signed)
Physical Therapy Treatment Patient Details Name: [name] MRN: [id number] DOB: April 16, 1956 Today's Date: 01/01/2020    History of Present Illness pt is a 64 y/o male with pmhx significant for CAD, DM, CABG, ACDF admitted 4/2 with COVID PNA and delirium, intubated 4/5, proned and paralyzed 4/5-4/9, sedated until 4/12, Bronchoscopy for tube change 4/16, 4/19 trached.  5/16 off sedation and on TC.    PT Comments    Pt alert on arrival.  He was not a focused on task today, needing consistent redirection to exercises and transition to EOB.  Pt leaned to the R for the 2nd session in a row.  I suspect to get off his sore buttock, but not totally sure.  Emphasis on balance at EOB and transfer to the chair.  Pt needed positioning to support for lean to R in the recliner.    Follow Up Recommendations  SNF     Equipment Recommendations  Other (comment)    Recommendations for Other Services       Precautions / Restrictions Precautions Precautions: Fall Precaution Comments: trach, large sacral decubitus, peg tube, Bertini use PSMV with therapy Restrictions Weight Bearing Restrictions: No    Mobility  Bed Mobility Overal bed mobility: Needs Assistance Bed Mobility: Rolling;Supine to Sit Rolling: Max assist   Supine to sit: Max assist;+2 for physical assistance     General bed mobility comments: directional cues, truncal and LE assist  Transfers Overall transfer level: Needs assistance   Transfers: Sit to/from Stand Sit to Stand: Total assist;+2 safety/equipment   Squat pivot transfers: Total assist;+2 safety/equipment     General transfer comment: pt was more difficult to assist today due to his tendency to list R off of his sacral wound and somewhat resistant to leaning forward to boost up.  Pt still unable to consistently w/bear through both LE's for the hole standing/transfer activity.  Ambulation/Gait             General Gait Details: unable   Stairs              Wheelchair Mobility    Modified Rankin (Stroke Patients Only)       Balance Overall balance assessment: Needs assistance Sitting-balance support: Feet supported;Single extremity supported;Bilateral upper extremity supported Sitting balance-Leahy Scale: Poor Sitting balance - Comments: Leaning R overall and less in midline, more I feel to get off his sore wound than inability to balance.     Standing balance-Leahy Scale: Zero                              Cognition Arousal/Alertness: Awake/alert Behavior During Therapy: Restless Overall Cognitive Status: Impaired/Different from baseline                   Orientation Level: Situation;Time;Place Current Attention Level: Focused   Following Commands: Follows one step commands inconsistently Safety/Judgement: Decreased awareness of safety;Decreased awareness of deficits Awareness: Intellectual Problem Solving: Slow processing;Decreased initiation;Difficulty sequencing;Requires verbal cues        Exercises Other Exercises Other Exercises: AAROM gross flexion, graded extention gross ext bil LE's Other Exercises: ROM bil bicep/tricep pressess with graded resistance and bil shoulder ROM Other Exercises: AAROM hip ab/add x 10    General Comments        Pertinent Vitals/Pain Pain Assessment: Faces Faces Pain Scale: Hurts little more Pain Location: buttock Pain Descriptors / Indicators: Discomfort;Guarding(squirming) Pain Intervention(s): Limited activity within patient's tolerance;Monitored during session  Home Living                      Prior Function            PT Goals (current goals can now be found in the care plan section) Acute Rehab PT Goals Patient Stated Goal: pt unable PT Goal Formulation: Patient unable to participate in goal setting Time For Goal Achievement: 01/08/20 Potential to Achieve Goals: Fair Progress towards PT goals: Progressing toward goals(very slow  progression)    Frequency    Min 3X/week      PT Plan Current plan remains appropriate;Frequency needs to be updated    Co-evaluation              AM-PAC PT "6 Clicks" Mobility   Outcome Measure  Help needed turning from your back to your side while in a flat bed without using bedrails?: Total Help needed moving from lying on your back to sitting on the side of a flat bed without using bedrails?: Total Help needed moving to and from a bed to a chair (including a wheelchair)?: Total Help needed standing up from a chair using your arms (e.g., wheelchair or bedside chair)?: Total Help needed to walk in hospital room?: Total Help needed climbing 3-5 steps with a railing? : Total 6 Click Score: 6    End of Session   Activity Tolerance: Patient tolerated treatment well;Patient limited by fatigue Patient left: in bed;with call bell/phone within reach;with bed alarm set;with SCD's reapplied Nurse Communication: Mobility status PT Visit Diagnosis: Other abnormalities of gait and mobility (R26.89);Muscle weakness (generalized) (M62.81);Difficulty in walking, not elsewhere classified (R26.2)     Time: 7096-2836 PT Time Calculation (min) (ACUTE ONLY): 47 min  Charges:  $Therapeutic Exercise: 8-22 mins $Therapeutic Activity: 23-37 mins                     01/01/2020  Ginger Carne., PT Acute Rehabilitation Services [phone]  (pager) [phone]  (office)   Tessie Fass Santiaga Butzin 01/01/2020, 1:28 PM

## 2020-01-01 NOTE — TOC Progression Note (Addendum)
Transition of Care [facility]) - Progression Note    Patient Details  Name: [name] MRN: [id number] Date of Birth: [birth year]  Transition of Care [facility]) CM/SW Contact  Aragorn Recker, Manfred Arch, RN Phone Number: 01/01/2020, 9:09 AM  Clinical Narrative:   [facility] informed CM that the appeal for LTACH has been lost.  Pt will need to go to a SNF.  Barriers for SNF placement are ; pt currently on TC 40%  (trach SNFs require 28% or less and pt currenlty needing hydrotherapy.  CM left VM for wife requesting a callback to discuss status of LTACH appeal         Expected Discharge Plan and Services                                                 Social Determinants of Health (SDOH) Interventions    Readmission Risk Interventions No flowsheet data found.

## 2020-01-01 NOTE — Progress Notes (Signed)
PROGRESS NOTE    [name]  NIO:[id number] DOB: [birth year] DOA: 11/25/2019 PCP: Jearld Fenton, NP   Brief Narrative:  HPI On 11/29/2019 by Dr. Will Bonnet Borchers is a 64 y.o. male with medical history significant of diabetes, hypertension, hyperlipidemia. COPD on chronic medical therapy and coronary artery disease s/p MI x 5, s/p 6 vessel CABG.  He has had URI symptoms for about a week and was seen by his PCP 3/230/21 for HA, cough, fever, myalgias, dysphagia and weakness. Denies loss of taste or smell, denied SOB. He was thought likely to have Covid 19  and was diagnosed with tested yesterday with a positive test. Today he had worsening shortness of breath and when EMS got there he was markedly hypoxic with oxygen saturations in the 60s. He was placed on nasal cannula and eventually placed on nonrebreather with nasal cannula as well as CPAP. EMS was able to improve his oxygen saturations to the low 90s but he was very lethargic and had poor color.He presented to [facility].   Interim history Admitted 4/2 with COVID pneumonia and delirium after 1 week of symptoms. Intubated on 4/5, required prolonged mechanical ventilatory support. Tracheostomy performed on 4/20, patient had multiple events during hospital stay, please see discussion below, but most notably work-up was significant for pneumomediastinum and pneumonia pericardium, are all has improved, he was liberated from the vent 5/20, tolerating trach collar, for last few days, been having significant delirium, agitation, where he required to be on Precedex, Precedex was weaned off 5/23, after he was started on multiple antipsychotic/sedative medications.  Assessment & Plan   Acute respiratory failure with hypoxia, in the setting of COVID-19 pneumonia. -With prolonged hospital stay, he is status post tracheostomy 4/20, currently weaned off the vent, tolerating trach collar for last few days. 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 -Management per PCCM . -He is currently tolerating trach collar, no vent requirement since 5/20 . - Continue PMV as tolerated. SLP following -Continue with Lasix 40 mg p.o. daily to prevent volume overload and decreasing free water  Toxic metabolic encephalopathy in setting of prolonged sedation, improving  Anxiety, agitated delirium from prolonged hospital stay, possible critical illness related ptsd  -anxiety manifesting as increased RR, pt endorses feels of panic, anxiety, sleep disturbance  -completed clonidine taper 5/19 -Precedex weaned off 5/23 -Continue with current regimen which is including BuSpar, Klonopin, and Seroquel, he was started on Zoloft today as well.  Infected sacral pressure ulcer, unstageable . -Wound culture growing Pseudomonas aeruginosa, continue with IV Zosyn , day 9 of 14 days course. -Continue with Foley catheter event wound to contamination.  Hypokalemia -Repleted.  Resolved problems: COVID-19 pneumonia -Has completed full course of remdesivir and dexamethasone, off isolation 4/23.  HCAP Pneumomediastinum/pneumopericardium Urinary retention VDRF  DVT Prophylaxis  lovenox  Code Status: Full  Family Communication: none at bedside  Disposition Plan:  Status is: Inpatient  Remains inpatient appropriate because:Inpatient level of care appropriate due to severity of illness   Dispo: The patient is from: Home              Anticipated d/c is to: TBD              Anticipated d/c date is: > 3 days              Patient currently is not medically stable to d/c.   Consultants  PCCM  Procedures  04/05 Rt CVC >  4/24 04/16 Bronchoscopy  04/20Tracheostomy 4/24 Rt PICC 5/20 Echocardiogram  Significant events 4/19: tachypneic overnight and this am. Unable to wean sedation. Tracheostomy done  4/20:successful trach yesterday but de-recruited and on some escalated settings today. Remains tachypneic this am.  Hypoglycemic yesterday and lantus changed 4/22:started on empiric abx but resp cx with abundant gpc. Improving oxygenation requirement but remains with rass -4 despite light sedation.  4/23:mucous plugging overnight and this am with copious/thick secretions.  4/28: issues overnight with increased vent settings. Still without purposeful responses. Fever overnight as well to 101.1. remains minimally responsive and unable to wean iv sedation except for short periods, despite increase in po agents.  4/29: somewhat improved settings overnight on 60/8. Still with temps overnight to 100.7 5/1: Continued improvement in vent requirement 50/8, remains on several sedating medications including continuous fentanyl and precedex. Trach changed to 6.0 shiley XLT. 5/7 tolerating pressure support ventilation while on sedation 5/16 off sedative infusions and on to trach collar. Tolerated x 3 hours 5/17 CPAP overnight. PRN APAP for fevers.  5/18 No issues overnight, placed on ATC this AM and tolerating well  5/20 Remained on ATC overnight, mild agitation noted overnight with addition of low dose precedex drip this resolved  5/23 Precedex weaned off  Antibiotics   Anti-infectives (From admission, onward)   Start     Dose/Rate Route Frequency Ordered Stop   12/24/19 1000  piperacillin-tazobactam (ZOSYN) IVPB 3.375 g     3.375 g 12.5 mL/hr over 240 Minutes Intravenous Every 8 hours 12/24/19 0908 01/03/20 1359   12/23/19 1000  Ampicillin-Sulbactam (UNASYN) 3 g in sodium chloride 0.9 % 100 mL IVPB  Status:  Discontinued     3 g 200 mL/hr over 30 Minutes Intravenous Every 8 hours 12/23/19 0914 12/23/19 0949   12/23/19 1000  doxycycline (VIBRAMYCIN) 100 mg in sodium chloride 0.9 % 250 mL IVPB  Status:  Discontinued     100 mg 125 mL/hr over 120 Minutes Intravenous Every 12 hours 12/23/19 0914 12/23/19 0949   12/22/19 1100  vancomycin (VANCOCIN) IVPB 1000 mg/200 mL premix  Status:  Discontinued     1,000 mg 200  mL/hr over 60 Minutes Intravenous Every 12 hours 12/21/19 2134 12/22/19 0930   12/21/19 2230  vancomycin (VANCOREADY) IVPB 1500 mg/300 mL     1,500 mg 150 mL/hr over 120 Minutes Intravenous  Once 12/21/19 2134 12/22/19 0026   12/21/19 2230  meropenem (MERREM) 1 g in sodium chloride 0.9 % 100 mL IVPB  Status:  Discontinued     1 g 200 mL/hr over 30 Minutes Intravenous Every 8 hours 12/21/19 2134 12/22/19 0930   12/19/19 0600  vancomycin (VANCOCIN) IVPB 1000 mg/200 mL premix     1,000 mg 200 mL/hr over 60 Minutes Intravenous To Radiology 12/18/19 1145 12/20/19 0600   11/28/19 0200  vancomycin (VANCOREADY) IVPB 1250 mg/250 mL  Status:  Discontinued     1,250 mg 166.7 mL/hr over 90 Minutes Intravenous Every 12 hours 11/27/19 1235 11/29/19 0930   11/27/19 1330  piperacillin-tazobactam (ZOSYN) IVPB 3.375 g     3.375 g 12.5 mL/hr over 240 Minutes Intravenous Every 8 hours 11/27/19 1232 12/04/19 0139   11/27/19 1330  vancomycin (VANCOREADY) IVPB 1500 mg/300 mL     1,500 mg 150 mL/hr over 120 Minutes Intravenous  Once 11/27/19 1233 11/27/19 1729   11/09/19 2000  cefTRIAXone (ROCEPHIN) 2 g in sodium chloride 0.9 % 100 mL IVPB     2 g 200 mL/hr over 30 Minutes  Intravenous Every 24 hours 11/09/19 0727 11/12/19 2027   11/09/19 1000  remdesivir 100 mg in sodium chloride 0.9 % 100 mL IVPB     100 mg 200 mL/hr over 30 Minutes Intravenous Daily 11/25/2019 1853 11/12/19 1132   11/09/19 0800  azithromycin (ZITHROMAX) 500 mg in sodium chloride 0.9 % 250 mL IVPB     500 mg 250 mL/hr over 60 Minutes Intravenous Every 24 hours 11/09/19 0727 11/11/19 1527   12/05/2019 1945  cefTRIAXone (ROCEPHIN) 2 g in sodium chloride 0.9 % 100 mL IVPB  Status:  Discontinued     2 g 200 mL/hr over 30 Minutes Intravenous Every 24 hours 11/16/2019 1940 11/09/19 0727   11/25/2019 1930  remdesivir 200 mg in sodium chloride 0.9% 250 mL IVPB     200 mg 580 mL/hr over 30 Minutes Intravenous Once 11/24/2019 1853 12/05/2019 2204       Subjective:   [name] seen and examined today.  No issues overnight. Patient shakes his head to most questions.  Objective:   Vitals:   01/01/20 1146 01/01/20 1200 01/01/20 1300 01/01/20 1400  BP:    121/75  Pulse:  (!) 121 (!) 121 (!) 113  Resp:  (!) 24 (!) 22 16  Temp: 98.4 F (36.9 C)     TempSrc: Oral     SpO2:  97% 90% 99%  Weight:      Height:        Intake/Output Summary (Last 24 hours) at 01/01/2020 1441 Last data filed at 01/01/2020 1429 Gross per 24 hour  Intake 2798.33 ml  Output 2325 ml  Net 473.33 ml   Filed Weights   12/30/19 0457 12/31/19 0440 01/01/20 0500  Weight: 74.1 kg 71 kg 73.1 kg    Exam  General: Well developed, chronically ill appearing, NAD  HEENT: NCAT,  mucous membranes moist.   Neck: Trach  Cardiovascular: S1 S2 auscultated, tachycardic  Respiratory: Diminished breath sounds  Abdomen: Soft, nontender, nondistended, + bowel sounds, peg  Extremities: warm dry without cyanosis clubbing. Trace edema. Muscle wasting   Neuro: awake and alert, moves all extremities  Psych: cannot assess   Data Reviewed: I have personally reviewed following labs and imaging studies  CBC: Recent Labs  Lab 12/27/19 0926 12/28/19 0319 12/29/19 0328 12/31/19 0539 01/01/20 0430  WBC 19.3* 21.3* 21.8* 19.8* 21.1*  NEUTROABS  --  10.0*  --   --   --   HGB 9.2* 9.1* 9.3* 11.2* 10.6*  HCT 30.4* 30.1* 30.2* 36.2* 34.6*  MCV 92.7 91.5 91.0 90.7 89.4  PLT 316 286 297 362 332   Basic Metabolic Panel: Recent Labs  Lab 12/27/19 0452 12/28/19 0319 12/29/19 0328 12/31/19 0539 01/01/20 0430 01/01/20 0709  NA 144 141 143 139 136  --   K 3.5 3.5 3.6 3.3* 3.4*  --   CL 105 99 101 94* 89*  --   CO2 30 32 34* 31 35*  --   GLUCOSE 178* 164* 145* 189* 164*  --   BUN 32* 30* 31* 43* 50*  --   CREATININE 0.49* 0.54* 0.43* 0.57* 0.56*  --   CALCIUM 9.0 8.9 9.1 10.1 10.0  --   MG  --   --  2.0  --   --  2.1   GFR: Estimated Creatinine Clearance:  89.1 mL/min (A) (by C-G formula based on SCr of 0.56 mg/dL (L)). Liver Function Tests: No results for input(s): AST, ALT, ALKPHOS, BILITOT, PROT, ALBUMIN in the last 168 hours.  No results for input(s): LIPASE, AMYLASE in the last 168 hours. No results for input(s): AMMONIA in the last 168 hours. Coagulation Profile: No results for input(s): INR, PROTIME in the last 168 hours. Cardiac Enzymes: No results for input(s): CKTOTAL, CKMB, CKMBINDEX, TROPONINI in the last 168 hours. BNP (last 3 results) No results for input(s): PROBNP in the last 8760 hours. HbA1C: No results for input(s): HGBA1C in the last 72 hours. CBG: Recent Labs  Lab 12/31/19 2029 01/01/20 0022 01/01/20 0434 01/01/20 0826 01/01/20 1145  GLUCAP 166* 209* 157* 198* 188*   Lipid Profile: No results for input(s): CHOL, HDL, LDLCALC, TRIG, CHOLHDL, LDLDIRECT in the last 72 hours. Thyroid Function Tests: No results for input(s): TSH, T4TOTAL, FREET4, T3FREE, THYROIDAB in the last 72 hours. Anemia Panel: No results for input(s): VITAMINB12, FOLATE, FERRITIN, TIBC, IRON, RETICCTPCT in the last 72 hours. Urine analysis:    Component Value Date/Time   COLORURINE YELLOW 12/21/2019 0331   APPEARANCEUR CLOUDY (A) 12/21/2019 0331   LABSPEC 1.020 12/21/2019 0331   PHURINE 5.0 12/21/2019 0331   GLUCOSEU NEGATIVE 12/21/2019 0331   HGBUR NEGATIVE 12/21/2019 0331   BILIRUBINUR NEGATIVE 12/21/2019 0331   KETONESUR NEGATIVE 12/21/2019 0331   PROTEINUR NEGATIVE 12/21/2019 0331   UROBILINOGEN 1.0 03/20/2014 1606   NITRITE NEGATIVE 12/21/2019 0331   LEUKOCYTESUR LARGE (A) 12/21/2019 0331   Sepsis Labs: @LABRCNTIP (procalcitonin:4,lacticidven:4)  ) Recent Results (from the past 240 hour(s))  Culture, respiratory (non-expectorated)     Status: None   Collection Time: 12/24/19  3:36 AM   Specimen: Tracheal Aspirate; Respiratory  Result Value Ref Range Status   Specimen Description TRACHEAL ASPIRATE  Final   Special Requests  NONE  Final   Gram Stain   Final    ABUNDANT WBC PRESENT, PREDOMINANTLY PMN FEW GRAM POSITIVE COCCI IN CLUSTERS    Culture   Final    Consistent with normal respiratory flora. Performed at [facility], [address] [place], [place]    Report Status 12/26/2019 FINAL  Final      Radiology Studies: DG CHEST PORT 1 VIEW  Result Date: 12/31/2019 CLINICAL DATA:  Hypoxia EXAM: PORTABLE CHEST 1 VIEW COMPARISON:  12/21/2019 FINDINGS: Tracheostomy in good position and unchanged. Feeding tube is been removed. Right arm PICC tip in the SVC. Extensive bibasilar airspace disease left greater than right is unchanged. No significant pleural effusion identified. IMPRESSION: Bibasilar infiltrates unchanged. Electronically Signed   By: 12/23/2019 M.D.   On: 12/31/2019 09:38     Scheduled Meds: . aspirin  81 mg Per Tube Daily  . busPIRone  10 mg Per Tube BID  . chlorhexidine  15 mL Mouth Rinse BID  . Chlorhexidine Gluconate Cloth  6 each Topical Q0600  . clonazePAM  0.25 mg Per Tube BID  . enoxaparin (LOVENOX) injection  40 mg Subcutaneous Q24H  . feeding supplement (PRO-STAT SUGAR FREE 64)  30 mL Per Tube Daily  . fentaNYL  1 patch Transdermal Q72H  . fluticasone  2 spray Each Nare QHS  . free water  200 mL Per Tube Q8H  . [START ON 01/02/2020] furosemide  40 mg Per Tube Daily  . gabapentin  200 mg Per Tube Q8H  . insulin aspart  0-15 Units Subcutaneous Q4H  . insulin aspart  6 Units Subcutaneous Q4H  . insulin glargine  25 Units Subcutaneous BID  . linagliptin  5 mg Per Tube Daily  . mouth rinse  15 mL Mouth Rinse q12n4p  .  multivitamin with minerals  1 tablet Per Tube Daily  . nutrition supplement (JUVEN)  1 packet Per Tube BID  . pantoprazole sodium  40 mg Per Tube Daily  . QUEtiapine  100 mg Per Tube Daily  . QUEtiapine  200 mg Per Tube QHS  . sertraline  50 mg Oral QHS  . simvastatin  40 mg Per Tube QHS  . sodium chloride flush  10-40 mL Intracatheter Q12H  .  thiamine  100 mg Per Tube Daily   Continuous Infusions: . sodium chloride Stopped (12/30/19 2129)  . feeding supplement (VITAL AF 1.2 CAL) 75 mL/hr at 01/01/20 1400  . piperacillin-tazobactam (ZOSYN)  IV 3.375 g (01/01/20 1428)     LOS: 54 days   Time Spent in minutes   45 minutes  Marthella Osorno D.O. on 01/01/2020 at 2:41 PM  Between 7am to 7pm - Please see pager noted on [email or web address]  After 7pm go to [email or web address]  And look for the night coverage person covering for me after hours  [facility] Office  [phone]

## 2020-01-01 NOTE — Progress Notes (Signed)
CSW completed PASSR screening for patient to admit to SNF at discharge. PASSR screen still running, CSW will upload necessary documents to portal.  [name], MSW, LCSW-A Transitions of Care  Clinical Social Worker  [facility] Emergency Departments  Medical ICU [phone]

## 2020-01-01 NOTE — Progress Notes (Signed)
  Speech Language Pathology Treatment: Dysphagia;Passy Muir Speaking valve  Patient Details Name: [name] MRN: [id number] DOB: [birth year] Today's Date: 01/01/2020 Time: 6269-4854 SLP Time Calculation (min) (ACUTE ONLY): 13 min  Assessment / Plan / Recommendation Clinical Impression  Pt seen for use of PMV and swallow musculature. Barrier this morning today was his lethargy and decreased level of alertness. He preferred to nod to answer questions x 2 and required cues to verbalize which he did in a low intensity and mildly wet vocal quality. Falling asleep frequently and exhibited adequate flow to upper airway, wearing valve 8-10 minutes without cough, congestion or back pressure. RR 22, HR 117 and SpO2 91-93%.  Consumption of ice and ice chips with oral holding, cues to initiate swallow which he was intermittently able to do. No overt s/s aspiration however he is unable to participate in objective swallow assessment today. RN states trach Perra be changed to smaller size and cuffless with possible greater volume of air available for phonation. Pt Luthi wear valve with nursing staff and therapy with full supervision.    HPI HPI: 64 year old man admitted 4/2 with COVID pneumonia and delirium after 1 week of symptoms.  Intubated on 4/5, required prolonged mechanical ventilatory support.  Tracheostomy performed on 4/20. Transitioning to trach collar trials 5/18. Has PEG.      SLP Plan  Continue with current plan of care       Recommendations  Diet recommendations: NPO Medication Administration: Via alternative means      Patient [place] use Passy-Muir Speech Valve: Intermittently with supervision PMSV Supervision: Full MD: Please consider changing trach tube to : Smaller size;Cuffless         Oral Care Recommendations: Oral care QID Follow up Recommendations: LTACH;24 hour supervision/assistance SLP Visit Diagnosis: Aphonia (R49.1) Plan: Continue with current plan of care                 [name] 01/01/2020, 9:39 AM  [name].Ed Nurse, children's [phone] Office [phone]

## 2020-01-01 NOTE — Progress Notes (Signed)
Report given to Marylu Lund, RN on 5W. Patient transferred via their bed. Patient placed on monitor, trach collar, bed placed in lowest position, and bed alarm set. Call placed to patient's wife to let her know patient was transferred.  Noe Gens, RN

## 2020-01-01 NOTE — Progress Notes (Signed)
Pt's trach changed from 6XLT cuffed to a 4 Shiley cfls without any complications. Spare trach and obturator is in the room at [facility].

## 2020-01-02 LAB — BASIC METABOLIC PANEL
Anion gap: 15 (ref 5–15)
BUN: 48 mg/dL — ABNORMAL HIGH (ref 8–23)
CO2: 31 mmol/L (ref 22–32)
Calcium: 10.1 mg/dL (ref 8.9–10.3)
Chloride: 91 mmol/L — ABNORMAL LOW (ref 98–111)
Creatinine, Ser: 0.61 mg/dL (ref 0.61–1.24)
GFR calc Af Amer: >60 mL/min (ref >60)
GFR calc non Af Amer: >60 mL/min (ref >60)
Glucose, Bld: 234 mg/dL — ABNORMAL HIGH (ref 70–99)
Potassium: 3.6 mmol/L (ref 3.5–5.1)
Sodium: 137 mmol/L (ref 135–145)

## 2020-01-02 LAB — GLUCOSE, CAPILLARY
Glucose-Capillary: 126 mg/dL — ABNORMAL HIGH (ref 70–99)
Glucose-Capillary: 143 mg/dL — ABNORMAL HIGH (ref 70–99)
Glucose-Capillary: 147 mg/dL — ABNORMAL HIGH (ref 70–99)
Glucose-Capillary: 169 mg/dL — ABNORMAL HIGH (ref 70–99)
Glucose-Capillary: 225 mg/dL — ABNORMAL HIGH (ref 70–99)
Glucose-Capillary: 226 mg/dL — ABNORMAL HIGH (ref 70–99)

## 2020-01-02 LAB — MAGNESIUM: Magnesium: 2.1 mg/dL (ref 1.7–2.4)

## 2020-01-02 NOTE — Progress Notes (Signed)
Pt trach capped per MD order. Pt left on RA but can be placed on [place] if needed. Spo2 92%. Pt snoring, no distress noted, no increased WOB. PT inner cannula cleaned and placed in bag at head of bed.

## 2020-01-02 NOTE — Progress Notes (Signed)
MEWs has been yellow

## 2020-01-02 NOTE — TOC Initial Note (Signed)
Transition of Care [facility]) - Initial/Assessment Note    Patient Details  Name: [name] MRN: [id number] Date of Birth: 1955/10/05  Transition of Care [facility]) CM/SW Contact:    Benard Halsted, LCSW Phone Number: 01/02/2020, 3:28 PM  Clinical Narrative:                 Patient transferred from 40M. SNF search is underway. Pasrr is under level 2 in person review. CSW will continue to follow.    Expected Discharge Plan: Skilled Nursing Facility Barriers to Discharge: Continued Medical Work up, Ship broker, Environmental education officer)   Patient Goals and CMS Choice Patient states their goals for this hospitalization and ongoing recovery are:: Rehab CMS Medicare.gov Compare Post Acute Care list provided to:: Patient Represenative (must comment) Choice offered to / list presented to : Spouse  Expected Discharge Plan and Services Expected Discharge Plan: [place] In-house Referral: Clinical Social Work   Post Acute Care Choice: [place] Living arrangements for the past 2 months: [place]                                      Prior Living Arrangements/Services Living arrangements for the past 2 months: Single Family Home Lives with:: Spouse Patient language and need for interpreter reviewed:: Yes Do you feel safe going back to the place where you live?: Yes      Need for Family Participation in Patient Care: Yes (Comment) Care giver support system in place?: Yes (comment)   Criminal Activity/Legal Involvement Pertinent to Current Situation/Hospitalization: No - Comment as needed  Activities of Daily Living Home Assistive Devices/Equipment: None ADL Screening (condition at time of admission) Patient's cognitive ability adequate to safely complete daily activities?: Yes Is the patient deaf or have difficulty hearing?: No Does the patient have difficulty seeing, even when wearing glasses/contacts?: No Does the patient  have difficulty concentrating, remembering, or making decisions?: No Patient able to express need for assistance with ADLs?: Yes Does the patient have difficulty dressing or bathing?: No Independently performs ADLs?: Yes (appropriate for developmental age) Does the patient have difficulty walking or climbing stairs?: Yes Weakness of Legs: Both Weakness of Arms/Hands: Both  Permission Sought/Granted Permission sought to share information with : Facility Sport and exercise psychologist, Family Supports Permission granted to share information with : No  Share Information with NAME: Golda Acre  Permission granted to share info w AGENCY: SNFs  Permission granted to share info w Relationship: Spouse  Permission granted to share info w Contact Information: [phone]  Emotional Assessment Appearance:: Appears stated age Attitude/Demeanor/Rapport: Unable to Assess Affect (typically observed): Unable to Assess Orientation: : (Intubated) Alcohol / Substance Use: Not Applicable Psych Involvement: No (comment)  Admission diagnosis:  Respiratory distress [R06.03] SOB (shortness of breath) [R06.02] COVID-19 virus infection [U07.1] Pneumonia due to COVID-19 virus [U07.1, J12.82] Patient Active Problem List   Diagnosis Date Noted  . Protein-calorie malnutrition, severe 01/01/2020  . Acute respiratory distress   . Status post tracheostomy ([place])   . Acute encephalopathy   . Acute hypoxemic respiratory failure ([place])   . Acute respiratory distress syndrome (ARDS) due to COVID-19 virus ([place]) 11/26/2019  . Allergy to insect bites 01/04/2019  . Type 2 diabetes mellitus without complication, without long-term current use of insulin ([place]) 10/25/2017  . Arthritis 07/25/2014  . Mild intermittent asthma 07/18/2014  . Insomnia 03/16/2014  . Hypertension 02/23/2013  .  Seasonal and perennial allergic rhinitis 08/27/2007  . G E R D 08/27/2007  . HLD (hyperlipidemia)   . Coronary atherosclerosis    PCP:  Lorre Munroe, NP Pharmacy:   [facility] [address], [place] - [address] [place] [address] Phone: [phone] Fax: [phone]     Social Determinants of Health (SDOH) Interventions    Readmission Risk Interventions Readmission Risk Prevention Plan 01/02/2020  Transportation Screening Complete  Medication Review (RN Care Manager) Complete  PCP or Specialist appointment within 3-5 days of discharge Complete  HRI or Home Care Consult Complete  SW Recovery Care/Counseling Consult Complete  Palliative Care Screening Complete  Skilled Nursing Facility Complete  Some recent data might be hidden

## 2020-01-02 NOTE — Progress Notes (Signed)
NAME:  [name], MRN:  [id number], DOB:  [birth year], LOS: 55 ADMISSION DATE:  10-Nov-2019, CONSULTATION DATE:  4/5 REFERRING MD:  Jerral Luisalberto, CHIEF COMPLAINT:  Dyspnea   Brief History   64 year old man admitted 4/2 with COVID pneumonia and delirium after 1 week of symptoms.  Intubated on 4/5, required prolonged mechanical ventilatory support.  Tracheostomy performed on 4/20  5/17 pending LTAC placement- -this is delayed due to leukocytosis, fevers and some difficult vent weaning and anxiety   Past Medical History  Laceration of left brachial artery  Hypercholesteremia HX of heart bypass GERD Type 2 diabetes CAD Asthma   Significant Hospital Events   4/19: tachypneic overnight and this am. Unable to wean sedation. Tracheostomy done  4/20: successful trach yesterday but de-recruited and on some escalated settings today. Remains tachypneic this am. Hypoglycemic yesterday and lantus changed 4/22: started on empiric abx but resp cx with abundant gpc. Improving oxygenation requirement but remains with rass -4 despite light sedation.  4/23:mucous plugging overnight and this am with copious/thick secretions.  4/28: issues overnight with increased vent settings. Still without purposeful responses. Fever overnight as well to 101.1. remains minimally responsive and unable to wean iv sedation except for short periods, despite increase in po agents.  4/29: somewhat improved settings overnight on 60/8. Still with temps overnight to 100.7 5/1: Continued improvement in vent requirement 50/8, remains on several sedating medications including continuous fentanyl and precedex. Trach changed to 6.0 shiley XLT. 5/7 tolerating pressure support ventilation while on sedation 5/16 off sedative infusions and on to trach collar. Tolerated x 3 hours 5/17 CPAP overnight. PRN APAP for fevers.  5/18 No issues overnight, placed on ATC this AM and tolerating well  5/20 Remained on ATC overnight, mild agitation noted  overnight with addition of low dose precedex drip this resolved  5/23 Precedex weaned off  Consults:    Procedures:  04/05 Rt CVC > 4/24 04/16 Bronchoscopy  04/20Tracheostomy 4/24 Rt PICC  Significant Diagnostic Tests:  4/22 CXR: 1. Lines and tubes stable position. 2. Persistent bibasilar pulmonary infiltrates/edema with interim slight clearing from prior exam. 3. Prior CABG. Heart size stable.  4/24 CXR: Slight increase in interstitial and airspace opacities particularly in the lower lobes. Findings suspicious for worsening asymmetric edema or infection.  Head CT 4/27 Mild chronic ischemic white matter disease. No acute intracranial abnormality seen.  Abdominal CT 4/27 > for PEG, trace pneumoperitoneum, extensive airspace disease due to COVID 19, kidney stone left, non-obstructing  MRI brain 5/3 > NAICP, chronic small vessel ischemia, chronic microhemorrhages, stable mild generalized atrophy, ethmoid sinus thickening, bilateral mastoid effusions  Chest CT 5/11 > 1. Pulmonary fibrosis with UIP pattern, new from 2015. 2. There is lower lobe atelectasis. No edema or convincing Pneumonia.  ECHO 5/20> LVEF 50% Mildly decreased LV function, without wall motion abnormalities. Grade I diastolic dysfunction. RV systolic function normal, normal pulmonary artery systolic pressure. RVSP 28.2 mmHgf  Micro Data:  COVID 4/2 >Positive  Blood cultures 4/2 > negative  Respiratory culture 4/5 > Negative  Respiratory culture 4/12 > Normal flora  Respiratory culture 4/21 > Normal flora  BCx 5/15> no growth 2 days Tracheal aspirate 5/14> Rare GPR-- consistent with normal respiratory flora  Sacral wound cx 5/15> pseudomonas aeruginosa   Antimicrobials:  4/21 Vancomycin > 4/23 4/21 Zosyn > 4/26, 5/19>>   Interim history/subjective:  No events. Very wiry and fidgety in bed  Objective   Blood pressure (!) 155/87, pulse (!) 126, temperature 98.5  F (36.9 C), temperature  source Axillary, resp. rate (!) 25, height 5\' 8"  (1.727 m), weight 76.9 kg, SpO2 (!) 80 %.    FiO2 (%):  [40 %] 40 %   Intake/Output Summary (Last 24 hours) at 01/02/2020 0841 Last data filed at 01/02/2020 0700 Gross per 24 hour  Intake 1225.8 ml  Output 2175 ml  Net -949.2 ml   Filed Weights   12/31/19 0440 01/01/20 0500 01/02/20 0500  Weight: 71 kg 73.1 kg 76.9 kg    Examination: GEN: frail man on TC HEENT: trach in place (shiley distal cuffed XLT 6-0) CV: RRR, ext warm PULM: Occasional rhonchi GI: PEG in place, soft EXT: Muscle wasting, trace edema NEURO: Moving all extremities SKIN: Multiple areas of skin breakdown  WBC stable Cr stable   Resolved Hospital Problem list   COVID-19 pneumonia -Has completed full course of remdesivir and dexamethasone, off isolation 4/23.  HCAP Pneumomediastinum/pneumopericardium Urinary retention VDRF  Assessment & Plan:   Acute respiratory failure with hypoxia, s/p tracheostomy in setting of COVID-19 PNA, deconditioning Post COVID pulmonary fibrosis  -Pulmonary fibrosis with UIP pattern on chest CT 5/11 -Improving tachypnea (felt to be due to anxiety), which had impeded vent weaning/liberation previously P - Downsize to 4-0 cuffless -CAP Trach as tolerated -Decreasing sedation - Continue aggressive PT  Hyponatremia resolved P: Drop lasix PO to daily and decrease free water   Goals of care:  Work toward SNF.  Disp: Prog status. Pulmonary will continue to follow for trach wean  Sherrilyn Rist, MD [place] [facility] Pager: [phone]

## 2020-01-02 NOTE — Progress Notes (Signed)
PROGRESS NOTE    [name]  BJY:[id number] DOB: 07-11-1956 DOA: 11-14-19 PCP: Jearld Fenton, NP   Brief Narrative:  HPI On 11-14-19 by Dr. Will Bonnet Ober is a 64 y.o. male with medical history significant of diabetes, hypertension, hyperlipidemia. COPD on chronic medical therapy and coronary artery disease s/p MI x 5, s/p 6 vessel CABG.  He has had URI symptoms for about a week and was seen by his PCP 3/230/21 for HA, cough, fever, myalgias, dysphagia and weakness. Denies loss of taste or smell, denied SOB. He was thought likely to have Covid 19  and was diagnosed with tested yesterday with a positive test. Today he had worsening shortness of breath and when EMS got there he was markedly hypoxic with oxygen saturations in the 60s. He was placed on nasal cannula and eventually placed on nonrebreather with nasal cannula as well as CPAP. EMS was able to improve his oxygen saturations to the low 90s but he was very lethargic and had poor color.He presented to [facility].   Interim history Admitted 4/2 with COVID pneumonia and delirium after 1 week of symptoms. Intubated on 4/5, required prolonged mechanical ventilatory support. Tracheostomy performed on 4/20, patient had multiple events during hospital stay, please see discussion below, but most notably work-up was significant for pneumomediastinum and pneumonia pericardium, are all has improved, he was liberated from the vent 5/20, tolerating trach collar, for last few days, been having significant delirium, agitation, where he required to be on Precedex, Precedex was weaned off 5/23, after he was started on multiple antipsychotic/sedative medications.  Assessment & Plan   Acute respiratory failure with hypoxia, in the setting of COVID-19 pneumonia. -With prolonged hospital stay, he is status post tracheostomy 4/20, currently weaned off the vent, tolerating trach collar for last few days. 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 -Management per PCCM . -He is currently tolerating trach collar, no vent requirement since 5/20  -Patient was on room air this morning -Continue PMV as tolerated. SLP following -Continue with Lasix 40 mg p.o. daily to prevent volume overload and decreasing free water  Toxic metabolic encephalopathy in setting of prolonged sedation, improving  Anxiety, agitated delirium from prolonged hospital stay, possible critical illness related ptsd  -anxiety manifesting as increased RR, pt endorses feels of panic, anxiety, sleep disturbance  -completed clonidine taper 5/19 -Precedex weaned off 5/23 -Continue with current regimen which is including BuSpar, Klonopin, and Seroquel, he was started on Zoloft today as well.  Infected sacral pressure ulcer, unstageable . -Wound culture growing Pseudomonas aeruginosa, continue with IV Zosyn , day 10 of 14 days course. -Continue with Foley catheter event wound to contamination.  Hypokalemia -Repleted.  Resolved problems: COVID-19 pneumonia -Has completed full course of remdesivir and dexamethasone, off isolation 4/23.  HCAP Pneumomediastinum/pneumopericardium Urinary retention VDRF  DVT Prophylaxis  lovenox  Code Status: Full  Family Communication: none at bedside  Disposition Plan:  Status is: Inpatient  Remains inpatient appropriate because:Inpatient level of care appropriate due to severity of illness and patient is receiving IV antibiotics   Dispo: The patient is from: Home              Anticipated d/c is to: TBD              Anticipated d/c date is: > 3 days              Patient currently is not medically stable to d/c.  Looking for SNF   Consultants  PCCM  Procedures  04/05 Rt CVC > 4/24 04/16 Bronchoscopy  04/20Tracheostomy 4/24 Rt PICC 5/20 Echocardiogram  Significant events 4/19: tachypneic overnight and this am. Unable to wean sedation. Tracheostomy done  4/20:successful trach  yesterday but de-recruited and on some escalated settings today. Remains tachypneic this am. Hypoglycemic yesterday and lantus changed 4/22:started on empiric abx but resp cx with abundant gpc. Improving oxygenation requirement but remains with rass -4 despite light sedation.  4/23:mucous plugging overnight and this am with copious/thick secretions.  4/28: issues overnight with increased vent settings. Still without purposeful responses. Fever overnight as well to 101.1. remains minimally responsive and unable to wean iv sedation except for short periods, despite increase in po agents.  4/29: somewhat improved settings overnight on 60/8. Still with temps overnight to 100.7 5/1: Continued improvement in vent requirement 50/8, remains on several sedating medications including continuous fentanyl and precedex. Trach changed to 6.0 shiley XLT. 5/7 tolerating pressure support ventilation while on sedation 5/16 off sedative infusions and on to trach collar. Tolerated x 3 hours 5/17 CPAP overnight. PRN APAP for fevers.  5/18 No issues overnight, placed on ATC this AM and tolerating well  5/20 Remained on ATC overnight, mild agitation noted overnight with addition of low dose precedex drip this resolved  5/23 Precedex weaned off  Antibiotics   Anti-infectives (From admission, onward)   Start     Dose/Rate Route Frequency Ordered Stop   12/24/19 1000  piperacillin-tazobactam (ZOSYN) IVPB 3.375 g     3.375 g 12.5 mL/hr over 240 Minutes Intravenous Every 8 hours 12/24/19 0908 01/03/20 1359   12/23/19 1000  Ampicillin-Sulbactam (UNASYN) 3 g in sodium chloride 0.9 % 100 mL IVPB  Status:  Discontinued     3 g 200 mL/hr over 30 Minutes Intravenous Every 8 hours 12/23/19 0914 12/23/19 0949   12/23/19 1000  doxycycline (VIBRAMYCIN) 100 mg in sodium chloride 0.9 % 250 mL IVPB  Status:  Discontinued     100 mg 125 mL/hr over 120 Minutes Intravenous Every 12 hours 12/23/19 0914 12/23/19 0949   12/22/19 1100   vancomycin (VANCOCIN) IVPB 1000 mg/200 mL premix  Status:  Discontinued     1,000 mg 200 mL/hr over 60 Minutes Intravenous Every 12 hours 12/21/19 2134 12/22/19 0930   12/21/19 2230  vancomycin (VANCOREADY) IVPB 1500 mg/300 mL     1,500 mg 150 mL/hr over 120 Minutes Intravenous  Once 12/21/19 2134 12/22/19 0026   12/21/19 2230  meropenem (MERREM) 1 g in sodium chloride 0.9 % 100 mL IVPB  Status:  Discontinued     1 g 200 mL/hr over 30 Minutes Intravenous Every 8 hours 12/21/19 2134 12/22/19 0930   12/19/19 0600  vancomycin (VANCOCIN) IVPB 1000 mg/200 mL premix     1,000 mg 200 mL/hr over 60 Minutes Intravenous To Radiology 12/18/19 1145 12/20/19 0600   11/28/19 0200  vancomycin (VANCOREADY) IVPB 1250 mg/250 mL  Status:  Discontinued     1,250 mg 166.7 mL/hr over 90 Minutes Intravenous Every 12 hours 11/27/19 1235 11/29/19 0930   11/27/19 1330  piperacillin-tazobactam (ZOSYN) IVPB 3.375 g     3.375 g 12.5 mL/hr over 240 Minutes Intravenous Every 8 hours 11/27/19 1232 12/04/19 0139   11/27/19 1330  vancomycin (VANCOREADY) IVPB 1500 mg/300 mL     1,500 mg 150 mL/hr over 120 Minutes Intravenous  Once 11/27/19 1233 11/27/19 1729   11/09/19 2000  cefTRIAXone (ROCEPHIN) 2 g in sodium chloride 0.9 %  100 mL IVPB     2 g 200 mL/hr over 30 Minutes Intravenous Every 24 hours 11/09/19 0727 11/12/19 2027   11/09/19 1000  remdesivir 100 mg in sodium chloride 0.9 % 100 mL IVPB     100 mg 200 mL/hr over 30 Minutes Intravenous Daily 11/20/2019 1853 11/12/19 1132   11/09/19 0800  azithromycin (ZITHROMAX) 500 mg in sodium chloride 0.9 % 250 mL IVPB     500 mg 250 mL/hr over 60 Minutes Intravenous Every 24 hours 11/09/19 0727 11/11/19 1527   11/10/2019 1945  cefTRIAXone (ROCEPHIN) 2 g in sodium chloride 0.9 % 100 mL IVPB  Status:  Discontinued     2 g 200 mL/hr over 30 Minutes Intravenous Every 24 hours 11/25/2019 1940 11/09/19 0727   11/27/2019 1930  remdesivir 200 mg in sodium chloride 0.9% 250 mL IVPB      200 mg 580 mL/hr over 30 Minutes Intravenous Once 11/29/2019 1853 11/09/2019 2204      Subjective:   Nora Ebbs seen and examined today.  No issues overnight. Patient shakes his head to most questions.   Objective:   Vitals:   01/02/20 0735 01/02/20 0938 01/02/20 1100 01/02/20 1147  BP: (!) 155/87 (!) 166/89  (!) 123/92  Pulse: (!) 126 (!) 114 (!) 117 (!) 118  Resp: (!) 25 (!) 22 (!) 22 20  Temp: 98.5 F (36.9 C)   97.8 F (36.6 C)  TempSrc: Axillary   Oral  SpO2: (!) 80% 93% 90% (!) 87%  Weight:      Height:        Intake/Output Summary (Last 24 hours) at 01/02/2020 1455 Last data filed at 01/02/2020 0900 Gross per 24 hour  Intake 556.07 ml  Output 1725 ml  Net -1168.93 ml   Filed Weights   12/31/19 0440 01/01/20 0500 01/02/20 0500  Weight: 71 kg 73.1 kg 76.9 kg   Exam  General: Well developed, chronically ill appearing, NAD  HEENT: NCAT, mucous membranes moist.   Neck: Trach  Cardiovascular: S1 S2 auscultated, tachycardic  Respiratory: Diminished breath sounds   Abdomen: Soft, nontender, nondistended, + bowel sounds, peg  Extremities: warm dry without cyanosis clubbing, muscle wasting   Neuro: Awake and alert, follows commands, moves all extremities  Data Reviewed: I have personally reviewed following labs and imaging studies  CBC: Recent Labs  Lab 12/27/19 0926 12/28/19 0319 12/29/19 0328 12/31/19 0539 01/01/20 0430  WBC 19.3* 21.3* 21.8* 19.8* 21.1*  NEUTROABS  --  10.0*  --   --   --   HGB 9.2* 9.1* 9.3* 11.2* 10.6*  HCT 30.4* 30.1* 30.2* 36.2* 34.6*  MCV 92.7 91.5 91.0 90.7 89.4  PLT 316 286 297 362 332   Basic Metabolic Panel: Recent Labs  Lab 12/28/19 0319 12/29/19 0328 12/31/19 0539 01/01/20 0430 01/01/20 0709 01/02/20 0451  NA 141 143 139 136  --  137  K 3.5 3.6 3.3* 3.4*  --  3.6  CL 99 101 94* 89*  --  91*  CO2 32 34* 31 35*  --  31  GLUCOSE 164* 145* 189* 164*  --  234*  BUN 30* 31* 43* 50*  --  48*  CREATININE 0.54* 0.43*  0.57* 0.56*  --  0.61  CALCIUM 8.9 9.1 10.1 10.0  --  10.1  MG  --  2.0  --   --  2.1 2.1   GFR: Estimated Creatinine Clearance: 89.1 mL/min (by C-G formula based on SCr of 0.61 mg/dL). Liver Function Tests:  No results for input(s): AST, ALT, ALKPHOS, BILITOT, PROT, ALBUMIN in the last 168 hours. No results for input(s): LIPASE, AMYLASE in the last 168 hours. No results for input(s): AMMONIA in the last 168 hours. Coagulation Profile: No results for input(s): INR, PROTIME in the last 168 hours. Cardiac Enzymes: No results for input(s): CKTOTAL, CKMB, CKMBINDEX, TROPONINI in the last 168 hours. BNP (last 3 results) No results for input(s): PROBNP in the last 8760 hours. HbA1C: No results for input(s): HGBA1C in the last 72 hours. CBG: Recent Labs  Lab 01/01/20 2016 01/02/20 0018 01/02/20 0410 01/02/20 0744 01/02/20 1149  GLUCAP 192* 126* 226* 169* 225*   Lipid Profile: No results for input(s): CHOL, HDL, LDLCALC, TRIG, CHOLHDL, LDLDIRECT in the last 72 hours. Thyroid Function Tests: No results for input(s): TSH, T4TOTAL, FREET4, T3FREE, THYROIDAB in the last 72 hours. Anemia Panel: No results for input(s): VITAMINB12, FOLATE, FERRITIN, TIBC, IRON, RETICCTPCT in the last 72 hours. Urine analysis:    Component Value Date/Time   COLORURINE YELLOW 12/21/2019 0331   APPEARANCEUR CLOUDY (A) 12/21/2019 0331   LABSPEC 1.020 12/21/2019 0331   PHURINE 5.0 12/21/2019 0331   GLUCOSEU NEGATIVE 12/21/2019 0331   HGBUR NEGATIVE 12/21/2019 0331   BILIRUBINUR NEGATIVE 12/21/2019 0331   KETONESUR NEGATIVE 12/21/2019 0331   PROTEINUR NEGATIVE 12/21/2019 0331   UROBILINOGEN 1.0 03/20/2014 1606   NITRITE NEGATIVE 12/21/2019 0331   LEUKOCYTESUR LARGE (A) 12/21/2019 0331   Sepsis Labs: @LABRCNTIP (procalcitonin:4,lacticidven:4)  ) Recent Results (from the past 240 hour(s))  Culture, respiratory (non-expectorated)     Status: None   Collection Time: 12/24/19  3:36 AM   Specimen:  Tracheal Aspirate; Respiratory  Result Value Ref Range Status   Specimen Description TRACHEAL ASPIRATE  Final   Special Requests NONE  Final   Gram Stain   Final    ABUNDANT WBC PRESENT, PREDOMINANTLY PMN FEW GRAM POSITIVE COCCI IN CLUSTERS    Culture   Final    Consistent with normal respiratory flora. Performed at [facility], [address], [place], [place]    Report Status 12/26/2019 FINAL  Final      Radiology Studies: No results found.   Scheduled Meds: . aspirin  81 mg Per Tube Daily  . busPIRone  10 mg Per Tube BID  . chlorhexidine  15 mL Mouth Rinse BID  . Chlorhexidine Gluconate Cloth  6 each Topical Q0600  . clonazePAM  0.25 mg Per Tube BID  . enoxaparin (LOVENOX) injection  40 mg Subcutaneous Q24H  . feeding supplement (PRO-STAT SUGAR FREE 64)  30 mL Per Tube Daily  . fentaNYL  1 patch Transdermal Q72H  . fluticasone  2 spray Each Nare QHS  . free water  200 mL Per Tube Q8H  . furosemide  40 mg Per Tube Daily  . gabapentin  200 mg Per Tube Q8H  . insulin aspart  0-15 Units Subcutaneous Q4H  . insulin aspart  6 Units Subcutaneous Q4H  . insulin glargine  25 Units Subcutaneous BID  . linagliptin  5 mg Per Tube Daily  . mouth rinse  15 mL Mouth Rinse q12n4p  . multivitamin with minerals  1 tablet Per Tube Daily  . nutrition supplement (JUVEN)  1 packet Per Tube BID  . pantoprazole sodium  40 mg Per Tube Daily  . QUEtiapine  100 mg Per Tube Daily  . QUEtiapine  200 mg Per Tube QHS  . sertraline  50 mg Oral QHS  . simvastatin  40 mg Per Tube QHS  . thiamine  100 mg Per Tube Daily   Continuous Infusions: . sodium chloride Stopped (12/30/19 2129)  . feeding supplement (VITAL AF 1.2 CAL) 1,000 mL (01/02/20 1119)  . piperacillin-tazobactam (ZOSYN)  IV 3.375 g (01/02/20 1333)     LOS: 55 days   Time Spent in minutes   30 minutes  Aarib Pulido D.O. on 01/02/2020 at 2:55 PM  Between 7am to 7pm - Please see pager noted on  [email or web address]  After 7pm go to [email or web address]  And look for the night coverage person covering for me after hours  [facility] Office  [phone]

## 2020-01-03 LAB — BASIC METABOLIC PANEL
Anion gap: 13 (ref 5–15)
BUN: 51 mg/dL — ABNORMAL HIGH (ref 8–23)
CO2: 34 mmol/L — ABNORMAL HIGH (ref 22–32)
Calcium: 10.2 mg/dL (ref 8.9–10.3)
Chloride: 96 mmol/L — ABNORMAL LOW (ref 98–111)
Creatinine, Ser: 0.66 mg/dL (ref 0.61–1.24)
GFR calc Af Amer: >60 mL/min (ref >60)
GFR calc non Af Amer: >60 mL/min (ref >60)
Glucose, Bld: 263 mg/dL — ABNORMAL HIGH (ref 70–99)
Potassium: 3.5 mmol/L (ref 3.5–5.1)
Sodium: 143 mmol/L (ref 135–145)

## 2020-01-03 LAB — GLUCOSE, CAPILLARY
Glucose-Capillary: 127 mg/dL — ABNORMAL HIGH (ref 70–99)
Glucose-Capillary: 139 mg/dL — ABNORMAL HIGH (ref 70–99)
Glucose-Capillary: 154 mg/dL — ABNORMAL HIGH (ref 70–99)
Glucose-Capillary: 170 mg/dL — ABNORMAL HIGH (ref 70–99)
Glucose-Capillary: 182 mg/dL — ABNORMAL HIGH (ref 70–99)
Glucose-Capillary: 206 mg/dL — ABNORMAL HIGH (ref 70–99)
Glucose-Capillary: 248 mg/dL — ABNORMAL HIGH (ref 70–99)

## 2020-01-03 LAB — MAGNESIUM: Magnesium: 2 mg/dL (ref 1.7–2.4)

## 2020-01-03 MED ORDER — PRO-STAT SUGAR FREE PO LIQD
30.0000 mL | Freq: Three times a day (TID) | ORAL | Status: DC
  Administered 2020-01-03 – 2020-01-07 (×11): 30 mL
  Filled 2020-01-03 (×11): qty 30

## 2020-01-03 MED ORDER — OSMOLITE 1.5 CAL PO LIQD
1000.0000 mL | ORAL | Status: DC
  Administered 2020-01-04 – 2020-01-05 (×2): 1000 mL
  Filled 2020-01-03 (×7): qty 1000

## 2020-01-03 MED ORDER — METOPROLOL TARTRATE 5 MG/5ML IV SOLN
2.5000 mg | Freq: Four times a day (QID) | INTRAVENOUS | Status: DC | PRN
  Administered 2020-01-03 – 2020-01-06 (×3): 2.5 mg via INTRAVENOUS
  Filled 2020-01-03 (×3): qty 5

## 2020-01-03 NOTE — Progress Notes (Signed)
NAME:  [name], MRN:  [id number], DOB:  1956/03/08, LOS: 13 ADMISSION DATE:  11/12/2019, CONSULTATION DATE:  4/5 REFERRING MD:  Sloan Leiter, CHIEF COMPLAINT:  Dyspnea   Brief History   64 year old man admitted 4/2 with COVID pneumonia and delirium after 1 week of symptoms.  Intubated on 4/5, required prolonged mechanical ventilatory support.  Tracheostomy performed on 4/20 5/17 pending LTAC placement- -this is delayed due to leukocytosis, fevers and some difficult vent weaning and anxiety  On medical floor, trach capped  Past Medical History  Laceration of left brachial artery  Hypercholesteremia HX of heart bypass GERD Type 2 diabetes CAD Asthma   Significant Hospital Events   4/19: tachypneic overnight and this am. Unable to wean sedation. Tracheostomy done  4/20: successful trach yesterday but de-recruited and on some escalated settings today. Remains tachypneic this am. Hypoglycemic yesterday and lantus changed 4/22: started on empiric abx but resp cx with abundant gpc. Improving oxygenation requirement but remains with rass -4 despite light sedation.  4/23:mucous plugging overnight and this am with copious/thick secretions.  4/28: issues overnight with increased vent settings. Still without purposeful responses. Fever overnight as well to 101.1. remains minimally responsive and unable to wean iv sedation except for short periods, despite increase in po agents.  4/29: somewhat improved settings overnight on 60/8. Still with temps overnight to 100.7 5/1: Continued improvement in vent requirement 50/8, remains on several sedating medications including continuous fentanyl and precedex. Trach changed to 6.0 shiley XLT. 5/7 tolerating pressure support ventilation while on sedation 5/16 off sedative infusions and on to trach collar. Tolerated x 3 hours 5/17 CPAP overnight. PRN APAP for fevers.  5/18 No issues overnight, placed on ATC this AM and tolerating well  5/20 Remained on ATC  overnight, mild agitation noted overnight with addition of low dose precedex drip this resolved  5/23 Precedex weaned off 5/27 tracheostomy tube capped:  Consults:    Procedures:  04/05 Rt CVC > 4/24 04/16 Bronchoscopy  04/20Tracheostomy 4/24 Rt PICC  Significant Diagnostic Tests:  4/22 CXR: 1. Lines and tubes stable position. 2. Persistent bibasilar pulmonary infiltrates/edema with interim slight clearing from prior exam. 3. Prior CABG. Heart size stable.  4/24 CXR: Slight increase in interstitial and airspace opacities particularly in the lower lobes. Findings suspicious for worsening asymmetric edema or infection.  Head CT 4/27 Mild chronic ischemic white matter disease. No acute intracranial abnormality seen.  Abdominal CT 4/27 > for PEG, trace pneumoperitoneum, extensive airspace disease due to COVID 19, kidney stone left, non-obstructing  MRI brain 5/3 > NAICP, chronic small vessel ischemia, chronic microhemorrhages, stable mild generalized atrophy, ethmoid sinus thickening, bilateral mastoid effusions  Chest CT 5/11 > 1. Pulmonary fibrosis with UIP pattern, new from 2015. 2. There is lower lobe atelectasis. No edema or convincing Pneumonia.  ECHO 5/20> LVEF 50% Mildly decreased LV function, without wall motion abnormalities. Grade I diastolic dysfunction. RV systolic function normal, normal pulmonary artery systolic pressure. RVSP 28.2 mmHgf  Micro Data:  COVID 4/2 >Positive  Blood cultures 4/2 > negative  Respiratory culture 4/5 > Negative  Respiratory culture 4/12 > Normal flora  Respiratory culture 4/21 > Normal flora  BCx 5/15> no growth 2 days Tracheal aspirate 5/14> Rare GPR-- consistent with normal respiratory flora  Sacral wound cx 5/15> pseudomonas aeruginosa   Antimicrobials:  4/21 Vancomycin > 4/23 4/21 Zosyn > 4/26, 5/19>>  Interim history/subjective:  No events. Very wiry and fidgety in bed-tolerating trach capped  Objective  Blood pressure 140/87, pulse (!) 108, temperature 98.5 F (36.9 C), temperature source Axillary, resp. rate (!) 22, height 5\' 8"  (1.727 m), weight 72.5 kg, SpO2 95 %.        Intake/Output Summary (Last 24 hours) at 01/03/2020 1524 Last data filed at 01/03/2020 1421 Gross per 24 hour  Intake 0 ml  Output 2550 ml  Net -2550 ml   Filed Weights   01/01/20 0500 01/02/20 0500 01/03/20 0500  Weight: 73.1 kg 76.9 kg 72.5 kg    Examination: GEN: frail man on TC HEENT: trach in place (shiley distal cuffed XLT 6-0) CV: RRR, ext warm PULM: Occasional rhonchi GI: PEG in place, soft SKIN: Multiple areas of skin breakdown  WBC stable Cr stable   Resolved Hospital Problem list   COVID-19 pneumonia -Has completed full course of remdesivir and dexamethasone, off isolation 4/23.  HCAP Pneumomediastinum/pneumopericardium Urinary retention VDRF  Assessment & Plan:   Acute respiratory failure with hypoxia, s/p tracheostomy in setting of COVID-19 PNA, deconditioning Post COVID pulmonary fibrosis  -Pulmonary fibrosis with UIP pattern on chest CT 5/11 -Improving tachypnea (felt to be due to anxiety), which had impeded vent weaning/liberation previously P - Downsized to 4-0 cuffless - CAP Trach as tolerated - Decreasing sedation - Continue aggressive PT  Hyponatremia resolved P: Discontinue Lasix   Goals of care:  Work toward SNF.  Disp: Prog status. Pulmonary will continue to follow for trach wean Will likely be able to take trach out 5/29 if he remains stable  6/29, MD [place] [facility] Pager: [phone]

## 2020-01-03 NOTE — Progress Notes (Signed)
Physical Therapy Treatment Patient Details Name: [name] MRN: [id number] DOB: 19-Jun-1956 Today's Date: 01/03/2020    History of Present Illness pt is a 64 y/o male with pmhx significant for CAD, DM, CABG, ACDF admitted 4/2 with COVID PNA and delirium, intubated 4/5, proned and paralyzed 4/5-4/9, sedated until 4/12, Bronchoscopy for tube change 4/16, 4/19 trached.  5/16 off sedation and on TC.  01/03/20 trach capped and pt on 2 L O2 [place].    PT Comments    Co session with OTA focusing on EOB positioning, orientation, and tolerance of O2 on RA.  Pt became quite restless, agitated, with decreased attention.  He did better as we could lessen stimuli both cognitive and tactile as he sat EOB interacting with OTA in front of him (PT and tech stayed quiet behind and gradually lessened assist at trunk.  Pt was most helpful with return to supine and was not safe to transfer even with lift up to recliner chair today.  PT will continue to follow acutely for safe mobility progression.   Follow Up Recommendations  SNF     Equipment Recommendations  3in1 (PT);Wheelchair (measurements PT);Wheelchair cushion (measurements PT);Hospital bed;Other (comment)(oxygen)    Recommendations for Other Services   NA     Precautions / Restrictions Precautions Precautions: Fall Precaution Comments: trach, large sacral decubitus, peg tube    Mobility  Bed Mobility Overal bed mobility: Needs Assistance Bed Mobility: Supine to Sit;Sit to Supine Rolling: +2 for physical assistance;Max assist   Supine to sit: +2 for physical assistance;Max assist;HOB elevated Sit to supine: +2 for physical assistance;Mod assist Sit to sidelying: Max assist;+2 for physical assistance;+2 for safety/equipment General bed mobility comments: Pt able to come up to sitting with 2-3 person max assist, resistive to EOB, likely also hurting on his bottom due to sacral wound.  Immediately once up pt attempting to push back to supine, working  hard leaning right and left, with increased HR and increased WOB, pulling O2 tubing off and generally getting more agitated.  Therapist positioned so one person in the front attempting to calm pt and two in the back attempting to control his trunk, with gradually lessened assist as pt calmed EOB.  He was able to assist more with returning to supine at trunk and helping to lift both legs than coming to sitting as he wanted to return to supine.                General Gait Details: unable to attempt today, pt too restless.           Balance Overall balance assessment: Needs assistance Sitting-balance support: Feet supported;Bilateral upper extremity supported Sitting balance-Leahy Scale: Zero Sitting balance - Comments: up to max assist EOB due to shifting of trunk, pushing back to supine and posteriorly, and then to the right (likely sore on his sacral wound in sitting).  O2 sats down to 82% on RA as pt pulled his O2 off and RR increased to 40s, HR 110s.  Pt did calm EOB and we were able to loosen assist to min at trunk.                                     Cognition Arousal/Alertness: Awake/alert Behavior During Therapy: Restless;Impulsive;Agitated;Anxious Overall Cognitive Status: Impaired/Different from baseline Area of Impairment: Orientation;Attention;Memory;Following commands;Safety/judgement;Awareness;Problem solving  Orientation Level: Disoriented to;Person;Place;Time;Situation Current Attention Level: Focused Memory: Decreased short-term memory Following Commands: Follows one step commands inconsistently Safety/Judgement: Decreased awareness of safety;Decreased awareness of deficits Awareness: Intellectual Problem Solving: Decreased initiation;Difficulty sequencing;Requires verbal cues;Requires tactile cues General Comments: pt. making some verbal statements today "hand me that phone" gesturing to it also.  states "trees" and was looking  toward the window. "go on and let me lay down" and initiated sidelying on L arm and bringing b les into bed, highly anxious and had difficulty with tactile stimulation, swatting at staff saying "get out", difficulty sustaining attention at all and picking at lines and imaginary things in his bed. Able to verbalize and trach capped.        Exercises Other Exercises Other Exercises: ankles are tight, but can get to neutral, Bil prevalons applied.  Heels look good and are pressure free.      General Comments General comments (skin integrity, edema, etc.): too restless and agitated to safely get to chair today.  Repositioned on R side to get off of sacral wound.       Pertinent Vitals/Pain Pain Assessment: Faces Faces Pain Scale: Hurts even more Pain Location: buttock Pain Descriptors / Indicators: Discomfort;Guarding Pain Intervention(s): Limited activity within patient's tolerance;Monitored during session;Repositioned           PT Goals (current goals can now be found in the care plan section) Progress towards PT goals: Progressing toward goals    Frequency    Min 3X/week      PT Plan Current plan remains appropriate    Co-evaluation PT/OT/SLP Co-Evaluation/Treatment: Yes Reason for Co-Treatment: Complexity of the patient's impairments (multi-system involvement);Necessary to address cognition/behavior during functional activity;For patient/therapist safety;To address functional/ADL transfers PT goals addressed during session: Mobility/safety with mobility;Balance OT goals addressed during session: ADL's and self-care      AM-PAC PT "6 Clicks" Mobility   Outcome Measure  Help needed turning from your back to your side while in a flat bed without using bedrails?: Total Help needed moving from lying on your back to sitting on the side of a flat bed without using bedrails?: Total Help needed moving to and from a bed to a chair (including a wheelchair)?: Total Help needed  standing up from a chair using your arms (e.g., wheelchair or bedside chair)?: Total Help needed to walk in hospital room?: Total Help needed climbing 3-5 steps with a railing? : Total 6 Click Score: 6    End of Session Equipment Utilized During Treatment: Gait belt;Oxygen(2L O2 [place]) Activity Tolerance: Treatment limited secondary to agitation Patient left: in bed;with call bell/phone within reach;with bed alarm set Nurse Communication: Mobility status PT Visit Diagnosis: Other abnormalities of gait and mobility (R26.89);Muscle weakness (generalized) (M62.81);Difficulty in walking, not elsewhere classified (R26.2)     Time: 1000-1036 PT Time Calculation (min) (ACUTE ONLY): 36 min  Charges:  $Therapeutic Activity: 8-22 mins                    Verdene Lennert, PT, DPT  Acute Rehabilitation [phone] pager #[phone] office     01/03/2020, 3:53 PM

## 2020-01-03 NOTE — Progress Notes (Signed)
Nutrition Follow-up  DOCUMENTATION CODES:   Severe malnutrition in context of acute illness/injury  INTERVENTION:  Provide Osmolite 1.5 formula via PEG at rate of 30 ml/hr and increase by 10 ml every 4 hours to goal rate of 60 ml/hr.   Provide 30 ml Prostat TID per tube.   Free water flushes of 200 ml every 8 hours per tube. (MD to adjust as appropriate)   Tube feeding regimen to provide 2460 kcal, 135 grams of protein, and 1694 ml water.   Juven 1 packet via tube BID, each packet provides 80 calories, 8 grams of carbohydrate, 2.5 grams of protein (collagen), 7 grams of L-arginine and 7 grams of L-glutamine; supplement contains CaHMB, Vitamins C, E, B12 and Zinc to promote wound healing.  NUTRITION DIAGNOSIS:   Severe Malnutrition related to acute illness(COVID-19) as evidenced by severe muscle depletion, percent weight loss(21% weight loss within a year); ongoing  GOAL:   Patient will meet greater than or equal to 90% of their needs; met with TF  MONITOR:   TF tolerance, Skin, Weight trends, Labs, I & O's  REASON FOR ASSESSMENT:   Consult Wound healing  ASSESSMENT:   63 yo male with ARDS secondary to COVID-19 pnuemonia requiring intubation, acute encephalopathy with hx of heavy EtOH abuse, AKI. PMH includes DM, HTN, CAD s/p PCI/CABG S/P PEG placement 5/18.  Trach has been capped. Per MD, Zuercher remove trach 5/29 if pt remains stable. Pt continues on NPO status. Family at bedside reports pt has been tolerating his tube feeds well with no difficulties. Pt transferred out of ICU status 5/26. RD to switch tube feeding to a more standard formulas as pt with no clinical indication to remain on a specialized hydrolyzed formula. Tube feeding to continue to provide 100% of nutrition needs.   Labs and medications reviewed.   Diet Order:   Diet Order            Diet NPO time specified Except for: Ice Chips  Diet effective now              EDUCATION NEEDS:   Not appropriate  for education at this time  Skin:  Skin Assessment: Skin Integrity Issues: Skin Integrity Issues:: Unstageable DTI: N/A Unstageable: coccyx  Last BM:  5/28  Height:   Ht Readings from Last 1 Encounters:  11/29/19 5' 8" (1.727 m)    Weight:   Wt Readings from Last 1 Encounters:  01/03/20 72.5 kg    BMI:  Body mass index is 24.3 kg/m.  Estimated Nutritional Needs:   Kcal:  2200-2400  Protein:  135-160 gm  Fluid:  >/= 2.2 L   , MS, RD, LDN RD pager number/after hours weekend pager number on Amion.  

## 2020-01-03 NOTE — Progress Notes (Signed)
PROGRESS NOTE    [name]  ZGY:[id number] DOB: [birth year] DOA: 11/12/2019 PCP: Evan Munroe, NP   Brief Narrative:  HPI On 12/05/2019 by Dr. Montel Clock [name] is a 64 y.o. male with medical history significant of diabetes, hypertension, hyperlipidemia. COPD on chronic medical therapy and coronary artery disease s/p MI x 5, s/p 6 vessel CABG.  He has had URI symptoms for about a week and was seen by his PCP 3/230/21 for HA, cough, fever, myalgias, dysphagia and weakness. Denies loss of taste or smell, denied SOB. He was thought likely to have Covid 19  and was diagnosed with tested yesterday with a positive test. Today he had worsening shortness of breath and when EMS got there he was markedly hypoxic with oxygen saturations in the 60s. He was placed on nasal cannula and eventually placed on nonrebreather with nasal cannula as well as CPAP. EMS was able to improve his oxygen saturations to the low 90s but he was very lethargic and had poor color.He presented to [facility].   Interim history Admitted 4/2 with COVID pneumonia and delirium after 1 week of symptoms. Intubated on 4/5, required prolonged mechanical ventilatory support. Tracheostomy performed on 4/20, patient had multiple events during hospital stay, please see discussion below, but most notably work-up was significant for pneumomediastinum and pneumonia pericardium, are all has improved, he was liberated from the vent 5/20, tolerating trach collar, for last few days, been having significant delirium, agitation, where he required to be on Precedex, Precedex was weaned off 5/23, after he was started on multiple antipsychotic/sedative medications.  Assessment & Plan   Acute respiratory failure with hypoxia, in the setting of COVID-19 pneumonia. -With prolonged hospital stay, he is status post tracheostomy 4/20, currently weaned off the vent, tolerating trach collar for last few days. 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 -Management per PCCM . -He is currently tolerating trach collar, no vent requirement since 5/20  -Continue PMV as tolerated. SLP following -Continue with Lasix 40 mg p.o. daily to prevent volume overload and decreasing free water  Toxic metabolic encephalopathy in setting of prolonged sedation, improving  Anxiety, agitated delirium from prolonged hospital stay, possible critical illness related ptsd  -anxiety manifesting as increased RR, pt endorses feels of panic, anxiety, sleep disturbance  -completed clonidine taper 5/19 -Precedex weaned off 5/23 -Continue with current regimen which is including BuSpar, Klonopin, and Seroquel, he was started on Zoloft today as well.  Infected sacral pressure ulcer, unstageable . -Wound culture growing Pseudomonas aeruginosa, continue with IV Zosyn , day 10 of 14 days course. -Continue with Foley catheter event wound to contamination.  Hypokalemia -Repleted.  Tachycardia -sinus -likely due to agitation -will order low dose IV metoprolol and continue to monitor  Resolved problems: COVID-19 pneumonia -Has completed full course of remdesivir and dexamethasone, off isolation 4/23.  HCAP Pneumomediastinum/pneumopericardium Urinary retention VDRF  DVT Prophylaxis  lovenox  Code Status: Full  Family Communication: none at bedside  Disposition Plan:  Status is: Inpatient  Remains inpatient appropriate because:Inpatient level of care appropriate due to severity of illness and patient is receiving IV antibiotics   Dispo: The patient is from: Home              Anticipated d/c is to: TBD              Anticipated d/c date is: > 3 days  Patient currently is not medically stable to d/c. Looking for SNF   Consultants  PCCM  Procedures  04/05 Rt CVC > 4/24 04/16 Bronchoscopy  04/20Tracheostomy 4/24 Rt PICC 5/20 Echocardiogram  Significant events 4/19: tachypneic overnight and this am. Unable  to wean sedation. Tracheostomy done  4/20:successful trach yesterday but de-recruited and on some escalated settings today. Remains tachypneic this am. Hypoglycemic yesterday and lantus changed 4/22:started on empiric abx but resp cx with abundant gpc. Improving oxygenation requirement but remains with rass -4 despite light sedation.  4/23:mucous plugging overnight and this am with copious/thick secretions.  4/28: issues overnight with increased vent settings. Still without purposeful responses. Fever overnight as well to 101.1. remains minimally responsive and unable to wean iv sedation except for short periods, despite increase in po agents.  4/29: somewhat improved settings overnight on 60/8. Still with temps overnight to 100.7 5/1: Continued improvement in vent requirement 50/8, remains on several sedating medications including continuous fentanyl and precedex. Trach changed to 6.0 shiley XLT. 5/7 tolerating pressure support ventilation while on sedation 5/16 off sedative infusions and on to trach collar. Tolerated x 3 hours 5/17 CPAP overnight. PRN APAP for fevers.  5/18 No issues overnight, placed on ATC this AM and tolerating well  5/20 Remained on ATC overnight, mild agitation noted overnight with addition of low dose precedex drip this resolved  5/23 Precedex weaned off  Antibiotics   Anti-infectives (From admission, onward)   Start     Dose/Rate Route Frequency Ordered Stop   12/24/19 1000  piperacillin-tazobactam (ZOSYN) IVPB 3.375 g     3.375 g 12.5 mL/hr over 240 Minutes Intravenous Every 8 hours 12/24/19 0908 01/03/20 0923   12/23/19 1000  Ampicillin-Sulbactam (UNASYN) 3 g in sodium chloride 0.9 % 100 mL IVPB  Status:  Discontinued     3 g 200 mL/hr over 30 Minutes Intravenous Every 8 hours 12/23/19 0914 12/23/19 0949   12/23/19 1000  doxycycline (VIBRAMYCIN) 100 mg in sodium chloride 0.9 % 250 mL IVPB  Status:  Discontinued     100 mg 125 mL/hr over 120 Minutes Intravenous  Every 12 hours 12/23/19 0914 12/23/19 0949   12/22/19 1100  vancomycin (VANCOCIN) IVPB 1000 mg/200 mL premix  Status:  Discontinued     1,000 mg 200 mL/hr over 60 Minutes Intravenous Every 12 hours 12/21/19 2134 12/22/19 0930   12/21/19 2230  vancomycin (VANCOREADY) IVPB 1500 mg/300 mL     1,500 mg 150 mL/hr over 120 Minutes Intravenous  Once 12/21/19 2134 12/22/19 0026   12/21/19 2230  meropenem (MERREM) 1 g in sodium chloride 0.9 % 100 mL IVPB  Status:  Discontinued     1 g 200 mL/hr over 30 Minutes Intravenous Every 8 hours 12/21/19 2134 12/22/19 0930   12/19/19 0600  vancomycin (VANCOCIN) IVPB 1000 mg/200 mL premix     1,000 mg 200 mL/hr over 60 Minutes Intravenous To Radiology 12/18/19 1145 12/20/19 0600   11/28/19 0200  vancomycin (VANCOREADY) IVPB 1250 mg/250 mL  Status:  Discontinued     1,250 mg 166.7 mL/hr over 90 Minutes Intravenous Every 12 hours 11/27/19 1235 11/29/19 0930   11/27/19 1330  piperacillin-tazobactam (ZOSYN) IVPB 3.375 g     3.375 g 12.5 mL/hr over 240 Minutes Intravenous Every 8 hours 11/27/19 1232 12/04/19 0139   11/27/19 1330  vancomycin (VANCOREADY) IVPB 1500 mg/300 mL     1,500 mg 150 mL/hr over 120 Minutes Intravenous  Once 11/27/19 1233 11/27/19 1729   11/09/19 2000  cefTRIAXone (ROCEPHIN) 2 g in sodium chloride 0.9 % 100 mL IVPB     2 g 200 mL/hr over 30 Minutes Intravenous Every 24 hours 11/09/19 0727 11/12/19 2027   11/09/19 1000  remdesivir 100 mg in sodium chloride 0.9 % 100 mL IVPB     100 mg 200 mL/hr over 30 Minutes Intravenous Daily 12/03/2019 1853 11/12/19 1132   11/09/19 0800  azithromycin (ZITHROMAX) 500 mg in sodium chloride 0.9 % 250 mL IVPB     500 mg 250 mL/hr over 60 Minutes Intravenous Every 24 hours 11/09/19 0727 11/11/19 1527   11/19/2019 1945  cefTRIAXone (ROCEPHIN) 2 g in sodium chloride 0.9 % 100 mL IVPB  Status:  Discontinued     2 g 200 mL/hr over 30 Minutes Intravenous Every 24 hours 12/01/2019 1940 11/09/19 0727   11/26/2019 1930   remdesivir 200 mg in sodium chloride 0.9% 250 mL IVPB     200 mg 580 mL/hr over 30 Minutes Intravenous Once 11/26/2019 1853 11/27/2019 2204      Subjective:   Austin Sauls seen and examined today.  No issues overnight. Patient shakes his head to most questions.   Objective:   Vitals:   01/03/20 0413 01/03/20 0500 01/03/20 0834 01/03/20 1220  BP: (!) 146/83  130/85 140/87  Pulse: (!) 103  (!) 102 (!) 108  Resp: (!) 21  (!) 21 (!) 22  Temp:   97.9 F (36.6 C) 98.5 F (36.9 C)  TempSrc:   Axillary Axillary  SpO2: 97%  98% 95%  Weight:  72.5 kg    Height:        Intake/Output Summary (Last 24 hours) at 01/03/2020 1233 Last data filed at 01/03/2020 8676 Gross per 24 hour  Intake 0 ml  Output 2000 ml  Net -2000 ml   Filed Weights   01/01/20 0500 01/02/20 0500 01/03/20 0500  Weight: 73.1 kg 76.9 kg 72.5 kg   Exam  General: Well developed, chronically ill appearing, NAD  HEENT: NCAT, mucous membranes moist.   Neck: Trach  Cardiovascular: S1 S2 auscultated, tachycardic  Respiratory: Diminished breath sounds   Abdomen: Soft, nontender, nondistended, + bowel sounds, peg  Extremities: warm dry without cyanosis clubbing, muscle wasting   Neuro: Awake and alert, follows commands, moves all extremities  Data Reviewed: I have personally reviewed following labs and imaging studies  CBC: Recent Labs  Lab 12/28/19 0319 12/29/19 0328 12/31/19 0539 01/01/20 0430  WBC 21.3* 21.8* 19.8* 21.1*  NEUTROABS 10.0*  --   --   --   HGB 9.1* 9.3* 11.2* 10.6*  HCT 30.1* 30.2* 36.2* 34.6*  MCV 91.5 91.0 90.7 89.4  PLT 286 297 362 332   Basic Metabolic Panel: Recent Labs  Lab 12/29/19 0328 12/31/19 0539 01/01/20 0430 01/01/20 0709 01/02/20 0451 01/03/20 1048  NA 143 139 136  --  137 143  K 3.6 3.3* 3.4*  --  3.6 3.5  CL 101 94* 89*  --  91* 96*  CO2 34* 31 35*  --  31 34*  GLUCOSE 145* 189* 164*  --  234* 263*  BUN 31* 43* 50*  --  48* 51*  CREATININE 0.43* 0.57* 0.56*  --   0.61 0.66  CALCIUM 9.1 10.1 10.0  --  10.1 10.2  MG 2.0  --   --  2.1 2.1 2.0   GFR: Estimated Creatinine Clearance: 89.1 mL/min (by C-G formula based on SCr of 0.66 mg/dL). Liver Function Tests: No results for input(s): AST, ALT, ALKPHOS,  BILITOT, PROT, ALBUMIN in the last 168 hours. No results for input(s): LIPASE, AMYLASE in the last 168 hours. No results for input(s): AMMONIA in the last 168 hours. Coagulation Profile: No results for input(s): INR, PROTIME in the last 168 hours. Cardiac Enzymes: No results for input(s): CKTOTAL, CKMB, CKMBINDEX, TROPONINI in the last 168 hours. BNP (last 3 results) No results for input(s): PROBNP in the last 8760 hours. HbA1C: No results for input(s): HGBA1C in the last 72 hours. CBG: Recent Labs  Lab 01/02/20 2014 01/03/20 0012 01/03/20 0429 01/03/20 0758 01/03/20 1223  GLUCAP 143* 206* 154* 127* 248*   Lipid Profile: No results for input(s): CHOL, HDL, LDLCALC, TRIG, CHOLHDL, LDLDIRECT in the last 72 hours. Thyroid Function Tests: No results for input(s): TSH, T4TOTAL, FREET4, T3FREE, THYROIDAB in the last 72 hours. Anemia Panel: No results for input(s): VITAMINB12, FOLATE, FERRITIN, TIBC, IRON, RETICCTPCT in the last 72 hours. Urine analysis:    Component Value Date/Time   COLORURINE YELLOW 12/21/2019 0331   APPEARANCEUR CLOUDY (A) 12/21/2019 0331   LABSPEC 1.020 12/21/2019 0331   PHURINE 5.0 12/21/2019 0331   GLUCOSEU NEGATIVE 12/21/2019 0331   HGBUR NEGATIVE 12/21/2019 0331   BILIRUBINUR NEGATIVE 12/21/2019 0331   KETONESUR NEGATIVE 12/21/2019 0331   PROTEINUR NEGATIVE 12/21/2019 0331   UROBILINOGEN 1.0 03/20/2014 1606   NITRITE NEGATIVE 12/21/2019 0331   LEUKOCYTESUR LARGE (A) 12/21/2019 0331   Sepsis Labs: @LABRCNTIP (procalcitonin:4,lacticidven:4)  ) No results found for this or any previous visit (from the past 240 hour(s)).    Radiology Studies: No results found.   Scheduled Meds: . aspirin  81 mg Per Tube  Daily  . busPIRone  10 mg Per Tube BID  . chlorhexidine  15 mL Mouth Rinse BID  . Chlorhexidine Gluconate Cloth  6 each Topical Q0600  . clonazePAM  0.25 mg Per Tube BID  . enoxaparin (LOVENOX) injection  40 mg Subcutaneous Q24H  . feeding supplement (PRO-STAT SUGAR FREE 64)  30 mL Per Tube Daily  . fentaNYL  1 patch Transdermal Q72H  . fluticasone  2 spray Each Nare QHS  . free water  200 mL Per Tube Q8H  . furosemide  40 mg Per Tube Daily  . gabapentin  200 mg Per Tube Q8H  . insulin aspart  0-15 Units Subcutaneous Q4H  . insulin aspart  6 Units Subcutaneous Q4H  . insulin glargine  25 Units Subcutaneous BID  . linagliptin  5 mg Per Tube Daily  . mouth rinse  15 mL Mouth Rinse q12n4p  . multivitamin with minerals  1 tablet Per Tube Daily  . nutrition supplement (JUVEN)  1 packet Per Tube BID  . pantoprazole sodium  40 mg Per Tube Daily  . QUEtiapine  100 mg Per Tube Daily  . QUEtiapine  200 mg Per Tube QHS  . sertraline  50 mg Oral QHS  . simvastatin  40 mg Per Tube QHS  . thiamine  100 mg Per Tube Daily   Continuous Infusions: . sodium chloride Stopped (12/30/19 2129)  . feeding supplement (VITAL AF 1.2 CAL) 1,000 mL (01/03/20 0248)     LOS: 56 days   Time Spent in minutes   30 minutes  Krissie Merrick D.O. on 01/03/2020 at 12:33 PM  Between 7am to 7pm - Please see pager noted on [email or web address]  After 7pm go to [email or web address]  And look for the night coverage person covering for me after hours  [facility] Office  [phone]

## 2020-01-03 NOTE — Progress Notes (Addendum)
Occupational Therapy Treatment Patient Details Name: Nicola Quesnell Karan MRN: [id number] DOB: 06/19/1956 Today's Date: 01/03/2020    History of present illness pt is a 64 y/o male with pmhx significant for CAD, DM, CABG, ACDF admitted 4/2 with COVID PNA and delirium, intubated 4/5, proned and paralyzed 4/5-4/9, sedated until 4/12, Bronchoscopy for tube change 4/16, 4/19 trached.  5/16 off sedation and on TC.   OT comments  Pt. Seen for skilled therapy session with PT for bed mobility and progression with pt. Participation with pre mobility activities/adls.  Pt. With 2 person max a for transition to eob.  Once eob max a x2 with periods of pt. Supported sitting with L ue support min guard/min a approx. 3 seconds.  Pt. Would attempt to correct L/R LOB.  Pt. Also verbalizing some requests.  Gesturing and requesting the phone. Pt. Also asking to lay back down and was able to initiate sidelying and movement of b les toward bed for back to bed.  Cont. To progress pt. As able with more mobility and engagement in  [place].    Follow Up Recommendations  SNF;Supervision/Assistance - 24 hour    Equipment Recommendations       Recommendations for Other Services      Precautions / Restrictions Precautions Precautions: Fall Precaution Comments: trach, large sacral decubitus, peg tube, Biegler use PSMV with therapy Restrictions Weight Bearing Restrictions: No       Mobility Bed Mobility Overal bed mobility: Needs Assistance Bed Mobility: Rolling;Supine to Sit;Sit to Sidelying     Supine to sit: Max assist;+2 for physical assistance;+2 for safety/equipment   Sit to sidelying: Max assist;+2 for physical assistance;+2 for safety/equipment General bed mobility comments: able to sit eob with 2 person support on back, with moments of supported sitting min guard a with use of his lue on the bed.  kept r ue on lap.  would attempt self correction with lob to l/r but also required physical assistance for full recovery  and safety.  more initiation with back to bed. after pt. stated "go on and let me lay down" he transitioned into sid lying and assisted with bringing b les into bed.  total ax2 to scoot to hob  Transfers                      Balance                                           ADL either performed or assessed with clinical judgement   ADL Overall ADL's : Needs assistance/impaired                                       General ADL Comments: pt. gesturing and then states "hand me that phone". placed phone in pts. left hand he held with with intermittent support. looking down at it but did not initiate dialing it or bringing to ear.     Vision       Perception     Praxis      Cognition Arousal/Alertness: Awake/alert Behavior During Therapy: Restless Overall Cognitive Status: Impaired/Different from baseline                         Following Commands: Follows one  step commands inconsistently Safety/Judgement: Decreased awareness of safety;Decreased awareness of deficits Awareness: Intellectual Problem Solving: Slow processing;Decreased initiation;Difficulty sequencing;Requires verbal cues General Comments: pt. making some verbal statements today "hand me that phone" gesturing to it also.  states "trees" and was looking toward the window. "go on and let me lay down" and initiated sidelying on L arm and bringing b les into bed        Exercises     Shoulder Instructions       General Comments      Pertinent Vitals/ Pain       Pain Assessment: Faces(unable to determine but questionable for sacral discomfort secondary to current wound, ? if that is why he shifts weight while sitting eob) Pain Location: buttock Pain Descriptors / Indicators: Discomfort;Guarding  Home Living                                          Prior Functioning/Environment              Frequency  Min 2X/week         Progress Toward Goals  OT Goals(current goals can now be found in the care plan section)  Progress towards OT goals: Progressing toward goals     Plan Discharge plan remains appropriate    Co-evaluation    PT/OT/SLP Co-Evaluation/Treatment: Yes Reason for Co-Treatment: Complexity of the patient's impairments (multi-system involvement);For patient/therapist safety;To address functional/ADL transfers   OT goals addressed during session: ADL's and self-care      AM-PAC OT "6 Clicks" Daily Activity     Outcome Measure   Help from another person eating meals?: Total Help from another person taking care of personal grooming?: Total Help from another person toileting, which includes using toliet, bedpan, or urinal?: Total Help from another person bathing (including washing, rinsing, drying)?: Total Help from another person to put on and taking off regular upper body clothing?: Total Help from another person to put on and taking off regular lower body clothing?: Total 6 Click Score: 6    End of Session    OT Visit Diagnosis: Other abnormalities of gait and mobility (R26.89);Muscle weakness (generalized) (M62.81);Pain;Other symptoms and signs involving cognitive function;Cognitive communication deficit (R41.841)   Activity Tolerance Patient tolerated treatment well   Patient Left in bed;with call bell/phone within reach;with bed alarm set   Nurse Communication Other (comment)(PT spoke with rn asking if new mattress indicated due to wound)        Time: 2831-5176 OT Time Calculation (min): 25 min  Charges: OT General Charges $OT Visit: 1 Visit OT Treatments $Self Care/Home Management : 8-22 mins  Boneta Lucks, COTA/L Acute Rehabilitation [phone]   Robet Leu 01/03/2020, 12:42 PM

## 2020-01-04 LAB — BASIC METABOLIC PANEL
Anion gap: 11 (ref 5–15)
BUN: 44 mg/dL — ABNORMAL HIGH (ref 8–23)
CO2: 34 mmol/L — ABNORMAL HIGH (ref 22–32)
Calcium: 10 mg/dL (ref 8.9–10.3)
Chloride: 99 mmol/L (ref 98–111)
Creatinine, Ser: 0.47 mg/dL — ABNORMAL LOW (ref 0.61–1.24)
GFR calc Af Amer: >60 mL/min (ref >60)
GFR calc non Af Amer: >60 mL/min (ref >60)
Glucose, Bld: 234 mg/dL — ABNORMAL HIGH (ref 70–99)
Potassium: 3.2 mmol/L — ABNORMAL LOW (ref 3.5–5.1)
Sodium: 144 mmol/L (ref 135–145)

## 2020-01-04 LAB — GLUCOSE, CAPILLARY
Glucose-Capillary: 248 mg/dL — ABNORMAL HIGH (ref 70–99)
Glucose-Capillary: 123 mg/dL — ABNORMAL HIGH (ref 70–99)
Glucose-Capillary: 194 mg/dL — ABNORMAL HIGH (ref 70–99)
Glucose-Capillary: 184 mg/dL — ABNORMAL HIGH (ref 70–99)
Glucose-Capillary: 210 mg/dL — ABNORMAL HIGH (ref 70–99)
Glucose-Capillary: 179 mg/dL — ABNORMAL HIGH (ref 70–99)

## 2020-01-04 LAB — MAGNESIUM: Magnesium: 2.1 mg/dL (ref 1.7–2.4)

## 2020-01-04 MED ORDER — METOPROLOL TARTRATE 25 MG PO TABS
25.0000 mg | ORAL_TABLET | Freq: Two times a day (BID) | ORAL | Status: DC
  Administered 2020-01-04 – 2020-01-07 (×7): 25 mg
  Filled 2020-01-04 (×7): qty 1

## 2020-01-04 MED ORDER — POTASSIUM CHLORIDE 10 MEQ/100ML IV SOLN
10.0000 meq | INTRAVENOUS | Status: AC
  Administered 2020-01-04 (×4): 10 meq via INTRAVENOUS
  Filled 2020-01-04 (×4): qty 100

## 2020-01-04 NOTE — Progress Notes (Signed)
Removed trach per order from Virl Diamond A, MD. No noted respiratory issues noted at this time

## 2020-01-04 NOTE — Progress Notes (Signed)
NAME:  [name], MRN:  [id number], DOB:  12/27/1955, LOS: 61 ADMISSION DATE:  Nov 11, 2019, CONSULTATION DATE:  4/5 REFERRING MD:  Sloan Leiter, CHIEF COMPLAINT:  Dyspnea   Brief History   64 year old man admitted 4/2 with COVID pneumonia and delirium after 1 week of symptoms.  Intubated on 4/5, required prolonged mechanical ventilatory support.  Tracheostomy performed on 4/20 5/17 pending LTAC placement- -this is delayed due to leukocytosis, fevers and some difficult vent weaning and anxiety  On medical floor, trach capped  Past Medical History  Laceration of left brachial artery  Hypercholesteremia HX of heart bypass GERD Type 2 diabetes CAD Asthma   Significant Hospital Events   4/19: tachypneic overnight and this am. Unable to wean sedation. Tracheostomy done  4/20: successful trach yesterday but de-recruited and on some escalated settings today. Remains tachypneic this am. Hypoglycemic yesterday and lantus changed 4/22: started on empiric abx but resp cx with abundant gpc. Improving oxygenation requirement but remains with rass -4 despite light sedation.  4/23:mucous plugging overnight and this am with copious/thick secretions.  4/28: issues overnight with increased vent settings. Still without purposeful responses. Fever overnight as well to 101.1. remains minimally responsive and unable to wean iv sedation except for short periods, despite increase in po agents.  4/29: somewhat improved settings overnight on 60/8. Still with temps overnight to 100.7 5/1: Continued improvement in vent requirement 50/8, remains on several sedating medications including continuous fentanyl and precedex. Trach changed to 6.0 shiley XLT. 5/7 tolerating pressure support ventilation while on sedation 5/16 off sedative infusions and on to trach collar. Tolerated x 3 hours 5/17 CPAP overnight. PRN APAP for fevers.  5/18 No issues overnight, placed on ATC this AM and tolerating well  5/20 Remained on ATC  overnight, mild agitation noted overnight with addition of low dose precedex drip this resolved  5/23 Precedex weaned off 5/27 tracheostomy tube capped:  Consults:    Procedures:  04/05 Rt CVC > 4/24 04/16 Bronchoscopy  04/20Tracheostomy 4/24 Rt PICC  Significant Diagnostic Tests:  4/22 CXR: 1. Lines and tubes stable position. 2. Persistent bibasilar pulmonary infiltrates/edema with interim slight clearing from prior exam. 3. Prior CABG. Heart size stable.  4/24 CXR: Slight increase in interstitial and airspace opacities particularly in the lower lobes. Findings suspicious for worsening asymmetric edema or infection.  Head CT 4/27 Mild chronic ischemic white matter disease. No acute intracranial abnormality seen.  Abdominal CT 4/27 > for PEG, trace pneumoperitoneum, extensive airspace disease due to COVID 19, kidney stone left, non-obstructing  MRI brain 5/3 > NAICP, chronic small vessel ischemia, chronic microhemorrhages, stable mild generalized atrophy, ethmoid sinus thickening, bilateral mastoid effusions  Chest CT 5/11 > 1. Pulmonary fibrosis with UIP pattern, new from 2015. 2. There is lower lobe atelectasis. No edema or convincing Pneumonia.  ECHO 5/20> LVEF 50% Mildly decreased LV function, without wall motion abnormalities. Grade I diastolic dysfunction. RV systolic function normal, normal pulmonary artery systolic pressure. RVSP 28.2 mmHgf  Micro Data:  COVID 4/2 >Positive  Blood cultures 4/2 > negative  Respiratory culture 4/5 > Negative  Respiratory culture 4/12 > Normal flora  Respiratory culture 4/21 > Normal flora  BCx 5/15> no growth 2 days Tracheal aspirate 5/14> Rare GPR-- consistent with normal respiratory flora  Sacral wound cx 5/15> pseudomonas aeruginosa   Antimicrobials:  4/21 Vancomycin > 4/23 4/21 Zosyn > 4/26, 5/19-5/27  Interim history/subjective:  No events. Appears to have tolerated tracheostomy capping well  Objective  Blood pressure (!) 146/89, pulse (!) 113, temperature 97.6 F (36.4 C), resp. rate 20, height 5\' 8"  (1.727 m), weight 73.8 kg, SpO2 100 %.        Intake/Output Summary (Last 24 hours) at 01/04/2020 01/06/2020 Last data filed at 01/04/2020 0700 Gross per 24 hour  Intake 4538.5 ml  Output 2800 ml  Net 1738.5 ml   Filed Weights   01/02/20 0500 01/03/20 0500 01/04/20 0500  Weight: 76.9 kg 72.5 kg 73.8 kg    Examination: GEN: frail man on nasal cannula HEENT: Size 4 trach tube capped in place CV: RRR, ext warm PULM: Clear breath sounds GI: PEG in place, soft SKIN: Multiple areas of skin breakdown  WBC stable Cr stable   Resolved Hospital Problem list   COVID-19 pneumonia -Has completed full course of remdesivir and dexamethasone, off isolation 4/23.  HCAP Pneumomediastinum/pneumopericardium Urinary retention VDRF  Assessment & Plan:  Acute respiratory failure with hypoxia, s/p tracheostomy in setting of COVID-19 pneumonia, deconditioning Post Covid pulmonary fibrosis -Tolerated tracheostomy capping for the last 48 hours -Need for sedation decreasing -Continue PT    Hyponatremia -Resolved  Goals of care -Work towards SNF  Will decannulate today  5/23, MD [place] [facility] Pager: [phone]

## 2020-01-04 NOTE — Plan of Care (Signed)
  Problem: Health Behavior/Discharge Planning: Goal: Ability to manage health-related needs will improve 01/04/2020 0758 by Jamesetta Geralds, RN Outcome: Progressing 01/04/2020 0757 by Jamesetta Geralds, RN Outcome: Not Progressing   Problem: Clinical Measurements: Goal: Ability to maintain clinical measurements within normal limits will improve 01/04/2020 0758 by Jamesetta Geralds, RN Outcome: Progressing 01/04/2020 0757 by Jamesetta Geralds, RN Outcome: Not Progressing Goal: Will remain free from infection 01/04/2020 0758 by Jamesetta Geralds, RN Outcome: Progressing 01/04/2020 0757 by Jamesetta Geralds, RN Outcome: Not Progressing Goal: Diagnostic test results will improve 01/04/2020 0758 by Jamesetta Geralds, RN Outcome: Progressing 01/04/2020 0757 by Jamesetta Geralds, RN Outcome: Not Progressing Goal: Respiratory complications will improve 01/04/2020 0758 by Jamesetta Geralds, RN Outcome: Progressing 01/04/2020 0757 by Jamesetta Geralds, RN Outcome: Not Progressing Goal: Cardiovascular complication will be avoided 01/04/2020 0758 by Jamesetta Geralds, RN Outcome: Progressing 01/04/2020 0757 by Jamesetta Geralds, RN Outcome: Not Progressing   Problem: Activity: Goal: Risk for activity intolerance will decrease 01/04/2020 0758 by Jamesetta Geralds, RN Outcome: Progressing 01/04/2020 0757 by Jamesetta Geralds, RN Outcome: Not Progressing   Problem: Nutrition: Goal: Adequate nutrition will be maintained 01/04/2020 0758 by Jamesetta Geralds, RN Outcome: Progressing 01/04/2020 0757 by Jamesetta Geralds, RN Outcome: Not Progressing   Problem: Coping: Goal: Level of anxiety will decrease 01/04/2020 0758 by Jamesetta Geralds, RN Outcome: Progressing 01/04/2020 0757 by Jamesetta Geralds, RN Outcome: Not Progressing   Problem: Elimination: Goal: Will not experience complications related to bowel motility 01/04/2020 0758 by Jamesetta Geralds,  RN Outcome: Progressing 01/04/2020 0757 by Jamesetta Geralds, RN Outcome: Not Progressing Goal: Will not experience complications related to urinary retention 01/04/2020 0758 by Jamesetta Geralds, RN Outcome: Progressing 01/04/2020 0757 by Jamesetta Geralds, RN Outcome: Not Progressing   Problem: Pain Managment: Goal: General experience of comfort will improve 01/04/2020 0758 by Jamesetta Geralds, RN Outcome: Progressing 01/04/2020 0757 by Jamesetta Geralds, RN Outcome: Not Progressing   Problem: Safety: Goal: Ability to remain free from injury will improve 01/04/2020 0758 by Jamesetta Geralds, RN Outcome: Progressing 01/04/2020 0757 by Jamesetta Geralds, RN Outcome: Not Progressing   Problem: Skin Integrity: Goal: Risk for impaired skin integrity will decrease 01/04/2020 0758 by Jamesetta Geralds, RN Outcome: Progressing 01/04/2020 0757 by Jamesetta Geralds, RN Outcome: Not Progressing   Problem: Education: Goal: Knowledge about tracheostomy care/management will improve 01/04/2020 0758 by Jamesetta Geralds, RN Outcome: Progressing 01/04/2020 0757 by Jamesetta Geralds, RN Outcome: Not Progressing   Problem: Activity: Goal: Ability to tolerate increased activity will improve 01/04/2020 0758 by Jamesetta Geralds, RN Outcome: Progressing 01/04/2020 0757 by Jamesetta Geralds, RN Outcome: Not Progressing   Problem: Health Behavior/Discharge Planning: Goal: Ability to manage tracheostomy will improve 01/04/2020 0758 by Jamesetta Geralds, RN Outcome: Progressing 01/04/2020 0757 by Jamesetta Geralds, RN Outcome: Not Progressing   Problem: Respiratory: Goal: Patent airway maintenance will improve 01/04/2020 0758 by Jamesetta Geralds, RN Outcome: Progressing 01/04/2020 0757 by Jamesetta Geralds, RN Outcome: Not Progressing   Problem: Role Relationship: Goal: Ability to communicate will improve 01/04/2020 0758 by Jamesetta Geralds, RN Outcome:  Progressing 01/04/2020 0757 by Jamesetta Geralds, RN Outcome: Not Progressing

## 2020-01-04 NOTE — Plan of Care (Addendum)
Patient A&O x1-2. VSS. Pt on 1 of oxygen sating above 93%. Lungs sounds regular and  Diminished. Free from falls. Trach removed by respiratory during shift. Dressing changed per orders. Turned in bed every two hours. Potassium supplemented during shift. Pt voiding adequately during shift via foley. Osmolite 1.5 cal @ 60 ml/hr via G-tube with scheduled Q8 flushes . BG managed. DVT prophylactic: lovenox. No c/o of pain. Bed wheels locked. Phone and call bell within reach. Bed alarm on. Pt is resting, no distress. Wife updated on POC.  Problem: Health Behavior/Discharge Planning: Goal: Ability to manage health-related needs will improve Outcome: Progressing   Problem: Clinical Measurements: Goal: Ability to maintain clinical measurements within normal limits will improve Outcome: Progressing Goal: Will remain free from infection Outcome: Progressing Goal: Diagnostic test results will improve Outcome: Progressing Goal: Respiratory complications will improve Outcome: Progressing Goal: Cardiovascular complication will be avoided Outcome: Progressing   Problem: Activity: Goal: Risk for activity intolerance will decrease Outcome: Progressing   Problem: Nutrition: Goal: Adequate nutrition will be maintained Outcome: Progressing   Problem: Coping: Goal: Level of anxiety will decrease Outcome: Progressing   Problem: Elimination: Goal: Will not experience complications related to bowel motility Outcome: Progressing Goal: Will not experience complications related to urinary retention Outcome: Progressing   Problem: Pain Managment: Goal: General experience of comfort will improve Outcome: Progressing   Problem: Safety: Goal: Ability to remain free from injury will improve Outcome: Progressing   Problem: Skin Integrity: Goal: Risk for impaired skin integrity will decrease Outcome: Progressing   Problem: Education: Goal: Knowledge about tracheostomy care/management will  improve Outcome: Progressing   Problem: Activity: Goal: Ability to tolerate increased activity will improve Outcome: Progressing   Problem: Health Behavior/Discharge Planning: Goal: Ability to manage tracheostomy will improve Outcome: Progressing   Problem: Respiratory: Goal: Patent airway maintenance will improve Outcome: Progressing   Problem: Role Relationship: Goal: Ability to communicate will improve Outcome: Progressing

## 2020-01-04 NOTE — Progress Notes (Signed)
PROGRESS NOTE    [name]  DBZ:[id number] DOB: 12/08/1955 DOA: 11/27/2019 PCP: Lorre Munroe, NP   Brief Narrative:  HPI On 12/01/2019 by Dr. Montel Clock Alcorn is a 64 y.o. male with medical history significant of diabetes, hypertension, hyperlipidemia. COPD on chronic medical therapy and coronary artery disease s/p MI x 5, s/p 6 vessel CABG.  He has had URI symptoms for about a week and was seen by his PCP 3/230/21 for HA, cough, fever, myalgias, dysphagia and weakness. Denies loss of taste or smell, denied SOB. He was thought likely to have Covid 19  and was diagnosed with tested yesterday with a positive test. Today he had worsening shortness of breath and when EMS got there he was markedly hypoxic with oxygen saturations in the 60s. He was placed on nasal cannula and eventually placed on nonrebreather with nasal cannula as well as CPAP. EMS was able to improve his oxygen saturations to the low 90s but he was very lethargic and had poor color.He presented to [facility].   Interim history Admitted 4/2 with COVID pneumonia and delirium after 1 week of symptoms. Intubated on 4/5, required prolonged mechanical ventilatory support. Tracheostomy performed on 4/20, patient had multiple events during hospital stay, please see discussion below, but most notably work-up was significant for pneumomediastinum and pneumonia pericardium, are all has improved, he was liberated from the vent 5/20, tolerating trach collar, for last few days, been having significant delirium, agitation, where he required to be on Precedex, Precedex was weaned off 5/23, after he was started on multiple antipsychotic/sedative medications.  Assessment & Plan   Acute respiratory failure with hypoxia, in the setting of COVID-19 pneumonia. -With prolonged hospital stay, he is status post tracheostomy 4/20, currently weaned off the vent, tolerating trach collar for last few days. 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 -Management per PCCM . -He is currently tolerating trach collar, no vent requirement since 5/20 - PCCM planning to remove trach/decannulate today- he tolerated capping well -currently on nasal canula -Continue with Lasix 40 mg p.o. daily to prevent volume overload and decreasing free water  Toxic metabolic encephalopathy in setting of prolonged sedation, improving  Anxiety, agitated delirium from prolonged hospital stay, possible critical illness related ptsd  -anxiety manifesting as increased RR, pt endorses feels of panic, anxiety, sleep disturbance  -completed clonidine taper 5/19 -Precedex weaned off 5/23 -Continue with current regimen which is including BuSpar, Klonopin, and Seroquel, he was started on Zoloft today as well.  Infected sacral pressure ulcer, unstageable . -Wound culture growing Pseudomonas aeruginosa, continue with IV Zosyn , day 11 of 14 days course. -Continue with Foley catheter event wound to contamination.  Hypokalemia -Will replace and continue to monitor BMP  -magnesium 2.1  Tachycardia -sinus -likely due to agitation -Patient was on metoprolol at home -Continue IV metoprolol as needed, will restart home dose of metoprolol and monitor  Resolved problems: COVID-19 pneumonia -Has completed full course of remdesivir and dexamethasone, off isolation 4/23.  HCAP Pneumomediastinum/pneumopericardium Urinary retention VDRF  DVT Prophylaxis  lovenox  Code Status: Full  Family Communication: none at bedside  Disposition Plan:  Status is: Inpatient  Remains inpatient appropriate because:Inpatient level of care appropriate due to severity of illness and patient is receiving IV antibiotics   Dispo: The patient is from: Home              Anticipated d/c is to: TBD  Anticipated d/c date is: > 3 days              Patient currently is not medically stable to d/c. Looking for SNF   Consultants   PCCM  Procedures  04/05 Rt CVC > 4/24 04/16 Bronchoscopy  04/20Tracheostomy 4/24 Rt PICC 5/20 Echocardiogram  Significant events 4/19: tachypneic overnight and this am. Unable to wean sedation. Tracheostomy done  4/20:successful trach yesterday but de-recruited and on some escalated settings today. Remains tachypneic this am. Hypoglycemic yesterday and lantus changed 4/22:started on empiric abx but resp cx with abundant gpc. Improving oxygenation requirement but remains with rass -4 despite light sedation.  4/23:mucous plugging overnight and this am with copious/thick secretions.  4/28: issues overnight with increased vent settings. Still without purposeful responses. Fever overnight as well to 101.1. remains minimally responsive and unable to wean iv sedation except for short periods, despite increase in po agents.  4/29: somewhat improved settings overnight on 60/8. Still with temps overnight to 100.7 5/1: Continued improvement in vent requirement 50/8, remains on several sedating medications including continuous fentanyl and precedex. Trach changed to 6.0 shiley XLT. 5/7 tolerating pressure support ventilation while on sedation 5/16 off sedative infusions and on to trach collar. Tolerated x 3 hours 5/17 CPAP overnight. PRN APAP for fevers.  5/18 No issues overnight, placed on ATC this AM and tolerating well  5/20 Remained on ATC overnight, mild agitation noted overnight with addition of low dose precedex drip this resolved  5/23 Precedex weaned off 5/29 Decannulated  Antibiotics   Anti-infectives (From admission, onward)   Start     Dose/Rate Route Frequency Ordered Stop   12/24/19 1000  piperacillin-tazobactam (ZOSYN) IVPB 3.375 g     3.375 g 12.5 mL/hr over 240 Minutes Intravenous Every 8 hours 12/24/19 0908 01/03/20 0923   12/23/19 1000  Ampicillin-Sulbactam (UNASYN) 3 g in sodium chloride 0.9 % 100 mL IVPB  Status:  Discontinued     3 g 200 mL/hr over 30 Minutes  Intravenous Every 8 hours 12/23/19 0914 12/23/19 0949   12/23/19 1000  doxycycline (VIBRAMYCIN) 100 mg in sodium chloride 0.9 % 250 mL IVPB  Status:  Discontinued     100 mg 125 mL/hr over 120 Minutes Intravenous Every 12 hours 12/23/19 0914 12/23/19 0949   12/22/19 1100  vancomycin (VANCOCIN) IVPB 1000 mg/200 mL premix  Status:  Discontinued     1,000 mg 200 mL/hr over 60 Minutes Intravenous Every 12 hours 12/21/19 2134 12/22/19 0930   12/21/19 2230  vancomycin (VANCOREADY) IVPB 1500 mg/300 mL     1,500 mg 150 mL/hr over 120 Minutes Intravenous  Once 12/21/19 2134 12/22/19 0026   12/21/19 2230  meropenem (MERREM) 1 g in sodium chloride 0.9 % 100 mL IVPB  Status:  Discontinued     1 g 200 mL/hr over 30 Minutes Intravenous Every 8 hours 12/21/19 2134 12/22/19 0930   12/19/19 0600  vancomycin (VANCOCIN) IVPB 1000 mg/200 mL premix     1,000 mg 200 mL/hr over 60 Minutes Intravenous To Radiology 12/18/19 1145 12/20/19 0600   11/28/19 0200  vancomycin (VANCOREADY) IVPB 1250 mg/250 mL  Status:  Discontinued     1,250 mg 166.7 mL/hr over 90 Minutes Intravenous Every 12 hours 11/27/19 1235 11/29/19 0930   11/27/19 1330  piperacillin-tazobactam (ZOSYN) IVPB 3.375 g     3.375 g 12.5 mL/hr over 240 Minutes Intravenous Every 8 hours 11/27/19 1232 12/04/19 0139   11/27/19 1330  vancomycin (VANCOREADY) IVPB 1500 mg/300 mL  1,500 mg 150 mL/hr over 120 Minutes Intravenous  Once 11/27/19 1233 11/27/19 1729   11/09/19 2000  cefTRIAXone (ROCEPHIN) 2 g in sodium chloride 0.9 % 100 mL IVPB     2 g 200 mL/hr over 30 Minutes Intravenous Every 24 hours 11/09/19 0727 11/12/19 2027   11/09/19 1000  remdesivir 100 mg in sodium chloride 0.9 % 100 mL IVPB     100 mg 200 mL/hr over 30 Minutes Intravenous Daily 18-Nov-2019 1853 11/12/19 1132   11/09/19 0800  azithromycin (ZITHROMAX) 500 mg in sodium chloride 0.9 % 250 mL IVPB     500 mg 250 mL/hr over 60 Minutes Intravenous Every 24 hours 11/09/19 0727 11/11/19  1527   18-Nov-2019 1945  cefTRIAXone (ROCEPHIN) 2 g in sodium chloride 0.9 % 100 mL IVPB  Status:  Discontinued     2 g 200 mL/hr over 30 Minutes Intravenous Every 24 hours Nov 18, 2019 1940 11/09/19 0727   11-18-2019 1930  remdesivir 200 mg in sodium chloride 0.9% 250 mL IVPB     200 mg 580 mL/hr over 30 Minutes Intravenous Once 11/18/2019 1853 11-18-2019 2204      Subjective:   Cal Winer seen and examined today.  Patient with no issues this morning.  Very limited interaction.  Does state good morning and can answer simple questions. Objective:   Vitals:   01/04/20 0500 01/04/20 0600 01/04/20 0800 01/04/20 1037  BP:  (!) 146/89 (!) 157/88 108/87  Pulse:  (!) 113 (!) 114   Resp:  20 (!) 24   Temp:  97.6 F (36.4 C) 97.6 F (36.4 C)   TempSrc:   Axillary   SpO2:  100% 95%   Weight: 73.8 kg     Height:        Intake/Output Summary (Last 24 hours) at 01/04/2020 1142 Last data filed at 01/04/2020 0915 Gross per 24 hour  Intake 4638.5 ml  Output 2000 ml  Net 2638.5 ml   Filed Weights   01/02/20 0500 01/03/20 0500 01/04/20 0500  Weight: 76.9 kg 72.5 kg 73.8 kg   Exam  General: Well developed, chronically ill appearing, NAD  HEENT: NCAT, mucous membranes moist.   Neck: Trach  Cardiovascular: S1 S2 auscultated, tachycardic  Respiratory: Diminished breath sounds   Abdomen: Soft, nontender, nondistended, + bowel sounds, peg  Extremities: warm dry without cyanosis clubbing, muscle wasting   Neuro: Awake and alert, follows commands, moves all extremities  Data Reviewed: I have personally reviewed following labs and imaging studies  CBC: Recent Labs  Lab 12/29/19 0328 12/31/19 0539 01/01/20 0430  WBC 21.8* 19.8* 21.1*  HGB 9.3* 11.2* 10.6*  HCT 30.2* 36.2* 34.6*  MCV 91.0 90.7 89.4  PLT 297 362 332   Basic Metabolic Panel: Recent Labs  Lab 12/29/19 0328 12/29/19 0328 12/31/19 0539 01/01/20 0430 01/01/20 0709 01/02/20 0451 01/03/20 1048 01/04/20 0500  NA 143   <  > 139 136  --  137 143 144  K 3.6   < > 3.3* 3.4*  --  3.6 3.5 3.2*  CL 101   < > 94* 89*  --  91* 96* 99  CO2 34*   < > 31 35*  --  31 34* 34*  GLUCOSE 145*   < > 189* 164*  --  234* 263* 234*  BUN 31*   < > 43* 50*  --  48* 51* 44*  CREATININE 0.43*   < > 0.57* 0.56*  --  0.61 0.66 0.47*  CALCIUM 9.1   < >  10.1 10.0  --  10.1 10.2 10.0  MG 2.0  --   --   --  2.1 2.1 2.0 2.1   < > = values in this interval not displayed.   GFR: Estimated Creatinine Clearance: 89.1 mL/min (A) (by C-G formula based on SCr of 0.47 mg/dL (L)). Liver Function Tests: No results for input(s): AST, ALT, ALKPHOS, BILITOT, PROT, ALBUMIN in the last 168 hours. No results for input(s): LIPASE, AMYLASE in the last 168 hours. No results for input(s): AMMONIA in the last 168 hours. Coagulation Profile: No results for input(s): INR, PROTIME in the last 168 hours. Cardiac Enzymes: No results for input(s): CKTOTAL, CKMB, CKMBINDEX, TROPONINI in the last 168 hours. BNP (last 3 results) No results for input(s): PROBNP in the last 8760 hours. HbA1C: No results for input(s): HGBA1C in the last 72 hours. CBG: Recent Labs  Lab 01/03/20 1611 01/03/20 2011 01/03/20 2353 01/04/20 0406 01/04/20 0804  GLUCAP 139* 170* 182* 248* 123*   Lipid Profile: No results for input(s): CHOL, HDL, LDLCALC, TRIG, CHOLHDL, LDLDIRECT in the last 72 hours. Thyroid Function Tests: No results for input(s): TSH, T4TOTAL, FREET4, T3FREE, THYROIDAB in the last 72 hours. Anemia Panel: No results for input(s): VITAMINB12, FOLATE, FERRITIN, TIBC, IRON, RETICCTPCT in the last 72 hours. Urine analysis:    Component Value Date/Time   COLORURINE YELLOW 12/21/2019 0331   APPEARANCEUR CLOUDY (A) 12/21/2019 0331   LABSPEC 1.020 12/21/2019 0331   PHURINE 5.0 12/21/2019 0331   GLUCOSEU NEGATIVE 12/21/2019 0331   HGBUR NEGATIVE 12/21/2019 0331   BILIRUBINUR NEGATIVE 12/21/2019 0331   KETONESUR NEGATIVE 12/21/2019 0331   PROTEINUR NEGATIVE  12/21/2019 0331   UROBILINOGEN 1.0 03/20/2014 1606   NITRITE NEGATIVE 12/21/2019 0331   LEUKOCYTESUR LARGE (A) 12/21/2019 0331   Sepsis Labs: @LABRCNTIP (procalcitonin:4,lacticidven:4)  ) No results found for this or any previous visit (from the past 240 hour(s)).    Radiology Studies: No results found.   Scheduled Meds: . aspirin  81 mg Per Tube Daily  . busPIRone  10 mg Per Tube BID  . chlorhexidine  15 mL Mouth Rinse BID  . Chlorhexidine Gluconate Cloth  6 each Topical Q0600  . clonazePAM  0.25 mg Per Tube BID  . enoxaparin (LOVENOX) injection  40 mg Subcutaneous Q24H  . feeding supplement (PRO-STAT SUGAR FREE 64)  30 mL Per Tube TID  . fentaNYL  1 patch Transdermal Q72H  . fluticasone  2 spray Each Nare QHS  . free water  200 mL Per Tube Q8H  . gabapentin  200 mg Per Tube Q8H  . insulin aspart  0-15 Units Subcutaneous Q4H  . insulin aspart  6 Units Subcutaneous Q4H  . insulin glargine  25 Units Subcutaneous BID  . linagliptin  5 mg Per Tube Daily  . mouth rinse  15 mL Mouth Rinse q12n4p  . multivitamin with minerals  1 tablet Per Tube Daily  . nutrition supplement (JUVEN)  1 packet Per Tube BID  . pantoprazole sodium  40 mg Per Tube Daily  . QUEtiapine  100 mg Per Tube Daily  . QUEtiapine  200 mg Per Tube QHS  . sertraline  50 mg Oral QHS  . simvastatin  40 mg Per Tube QHS  . thiamine  100 mg Per Tube Daily   Continuous Infusions: . sodium chloride Stopped (12/30/19 2129)  . feeding supplement (OSMOLITE 1.5 CAL) 60 mL/hr at 01/04/20 0700     LOS: 57 days   Time Spent in minutes  30 minutes  Tali Cleaves D.O. on 01/04/2020 at 11:42 AM  Between 7am to 7pm - Please see pager noted on [email or web address]  After 7pm go to [email or web address]  And look for the night coverage person covering for me after hours  [facility] Office  [phone]

## 2020-01-04 NOTE — Plan of Care (Deleted)
  Problem: Health Behavior/Discharge Planning: Goal: Ability to manage health-related needs will improve Outcome: Not Progressing   Problem: Clinical Measurements: Goal: Ability to maintain clinical measurements within normal limits will improve Outcome: Not Progressing Goal: Will remain free from infection Outcome: Not Progressing Goal: Diagnostic test results will improve Outcome: Not Progressing Goal: Respiratory complications will improve Outcome: Not Progressing Goal: Cardiovascular complication will be avoided Outcome: Not Progressing   Problem: Activity: Goal: Risk for activity intolerance will decrease Outcome: Not Progressing   Problem: Nutrition: Goal: Adequate nutrition will be maintained Outcome: Not Progressing   Problem: Coping: Goal: Level of anxiety will decrease Outcome: Not Progressing   Problem: Elimination: Goal: Will not experience complications related to bowel motility Outcome: Not Progressing Goal: Will not experience complications related to urinary retention Outcome: Not Progressing   Problem: Pain Managment: Goal: General experience of comfort will improve Outcome: Not Progressing   Problem: Safety: Goal: Ability to remain free from injury will improve Outcome: Not Progressing   Problem: Skin Integrity: Goal: Risk for impaired skin integrity will decrease Outcome: Not Progressing   Problem: Education: Goal: Knowledge about tracheostomy care/management will improve Outcome: Not Progressing   Problem: Activity: Goal: Ability to tolerate increased activity will improve Outcome: Not Progressing   Problem: Health Behavior/Discharge Planning: Goal: Ability to manage tracheostomy will improve Outcome: Not Progressing   Problem: Respiratory: Goal: Patent airway maintenance will improve Outcome: Not Progressing   Problem: Role Relationship: Goal: Ability to communicate will improve Outcome: Not Progressing

## 2020-01-05 LAB — GLUCOSE, CAPILLARY
Glucose-Capillary: 161 mg/dL — ABNORMAL HIGH (ref 70–99)
Glucose-Capillary: 237 mg/dL — ABNORMAL HIGH (ref 70–99)
Glucose-Capillary: 128 mg/dL — ABNORMAL HIGH (ref 70–99)
Glucose-Capillary: 157 mg/dL — ABNORMAL HIGH (ref 70–99)
Glucose-Capillary: 103 mg/dL — ABNORMAL HIGH (ref 70–99)
Glucose-Capillary: 192 mg/dL — ABNORMAL HIGH (ref 70–99)

## 2020-01-05 LAB — BASIC METABOLIC PANEL
Anion gap: 8 (ref 5–15)
BUN: 37 mg/dL — ABNORMAL HIGH (ref 8–23)
CO2: 29 mmol/L (ref 22–32)
Calcium: 9.8 mg/dL (ref 8.9–10.3)
Chloride: 105 mmol/L (ref 98–111)
Creatinine, Ser: 0.45 mg/dL — ABNORMAL LOW (ref 0.61–1.24)
GFR calc Af Amer: >60 mL/min (ref >60)
GFR calc non Af Amer: >60 mL/min (ref >60)
Glucose, Bld: 226 mg/dL — ABNORMAL HIGH (ref 70–99)
Potassium: 3.5 mmol/L (ref 3.5–5.1)
Sodium: 142 mmol/L (ref 135–145)

## 2020-01-05 LAB — MRSA PCR SCREENING: MRSA by PCR: NEGATIVE

## 2020-01-05 LAB — MAGNESIUM: Magnesium: 2.1 mg/dL (ref 1.7–2.4)

## 2020-01-05 NOTE — TOC Progression Note (Signed)
Transition of Care [facility]) - Progression Note    Patient Details  Name: Jowel Waltner [place]enbury MRN: [id number] Date of Birth: 01-27-1956  Transition of [facility]) CM/SW Contact  Jimmy Picket, [place] Phone Number: 01/05/2020, 3:45 PM  Clinical Narrative:     CSW spoke to pts wife sybil about snf. Sybil stated that she would prefer pt to go to [facility]. CSW explained that CIR is rigorous and intensive program. Gillis Ends stated  She feels that would be best for pt. CSW reached out to MD, MD stated they would put in a consult. CSW discussed snf as back up options. Sybil stated she is fine with csw sending info to [facility]. Sybil stated she was confident about [facility] and [facility] and declined info being sent to other snf's.   CSW will continue to follow.   Expected Discharge Plan: Skilled Nursing Facility Barriers to Discharge: Continued Medical Work up, English as a second language teacher, Engineer, mining)  Expected Discharge Plan and Services Expected Discharge Plan: Skilled Nursing Facility In-house Referral: Clinical Social Work   Post Acute Care Choice: Skilled Nursing Facility Living arrangements for the past 2 months: Single Family Home                                       Social Determinants of Health (SDOH) Interventions    Readmission Risk Interventions Readmission Risk Prevention Plan 01/02/2020  Transportation Screening Complete  Medication Review Ocean[facility]) Complete  PCP or Specialist appointment within 3-5 days of discharge Complete  HRI or Home Care Consult Complete  SW Recovery Care/Counseling Consult Complete  Palliative Care Screening Complete  Skilled Nursing Facility Complete  Some recent data might be hidden   Jimmy Picket, [place], [place] Clinical Social Worker [phone]

## 2020-01-05 NOTE — Social Work (Signed)
Pt passr number is [phone] H.  Jimmy Picket, Theresia Majors, [place] Clinical Social Worker [phone]

## 2020-01-05 NOTE — Progress Notes (Signed)
NAME:  [name], MRN:  [id number], DOB:  [birth year], LOS: 58 ADMISSION DATE:  11/17/2019, CONSULTATION DATE:  4/5 REFERRING MD:  Jerral Coree, CHIEF COMPLAINT:  Dyspnea   Brief History   64 year old man admitted 4/2 with COVID pneumonia and delirium after 1 week of symptoms.  Intubated on 4/5, required prolonged mechanical ventilatory support.  Tracheostomy performed on 4/20 5/17 pending LTAC placement- -this is delayed due to leukocytosis, fevers and some difficult vent weaning and anxiety  On medical floor, trach capped Decannulated 5/29  Past Medical History  Laceration of left brachial artery  Hypercholesteremia HX of heart bypass GERD Type 2 diabetes CAD Asthma   Significant Hospital Events   4/19: tachypneic overnight and this am. Unable to wean sedation. Tracheostomy done  4/20: successful trach yesterday but de-recruited and on some escalated settings today. Remains tachypneic this am. Hypoglycemic yesterday and lantus changed 4/22: started on empiric abx but resp cx with abundant gpc. Improving oxygenation requirement but remains with rass -4 despite light sedation.  4/23:mucous plugging overnight and this am with copious/thick secretions.  4/28: issues overnight with increased vent settings. Still without purposeful responses. Fever overnight as well to 101.1. remains minimally responsive and unable to wean iv sedation except for short periods, despite increase in po agents.  4/29: somewhat improved settings overnight on 60/8. Still with temps overnight to 100.7 5/1: Continued improvement in vent requirement 50/8, remains on several sedating medications including continuous fentanyl and precedex. Trach changed to 6.0 shiley XLT. 5/7 tolerating pressure support ventilation while on sedation 5/16 off sedative infusions and on to trach collar. Tolerated x 3 hours 5/17 CPAP overnight. PRN APAP for fevers.  5/18 No issues overnight, placed on ATC this AM and tolerating well  5/20  Remained on ATC overnight, mild agitation noted overnight with addition of low dose precedex drip this resolved  5/23 Precedex weaned off 5/27 tracheostomy tube capped: 5/29 decannulated  Consults:    Procedures:  04/05 Rt CVC > 4/24 04/16 Bronchoscopy  04/20Tracheostomy 4/24 Rt PICC  Significant Diagnostic Tests:  4/22 CXR: 1. Lines and tubes stable position. 2. Persistent bibasilar pulmonary infiltrates/edema with interim slight clearing from prior exam. 3. Prior CABG. Heart size stable.  4/24 CXR: Slight increase in interstitial and airspace opacities particularly in the lower lobes. Findings suspicious for worsening asymmetric edema or infection.  Head CT 4/27 Mild chronic ischemic white matter disease. No acute intracranial abnormality seen.  Abdominal CT 4/27 > for PEG, trace pneumoperitoneum, extensive airspace disease due to COVID 19, kidney stone left, non-obstructing  MRI brain 5/3 > NAICP, chronic small vessel ischemia, chronic microhemorrhages, stable mild generalized atrophy, ethmoid sinus thickening, bilateral mastoid effusions  Chest CT 5/11 > 1. Pulmonary fibrosis with UIP pattern, new from 2015. 2. There is lower lobe atelectasis. No edema or convincing Pneumonia.  ECHO 5/20> LVEF 50% Mildly decreased LV function, without wall motion abnormalities. Grade I diastolic dysfunction. RV systolic function normal, normal pulmonary artery systolic pressure. RVSP 28.2 mmHgf  Micro Data:  COVID 4/2 >Positive  Blood cultures 4/2 > negative  Respiratory culture 4/5 > Negative  Respiratory culture 4/12 > Normal flora  Respiratory culture 4/21 > Normal flora  BCx 5/15> no growth 2 days Tracheal aspirate 5/14> Rare GPR-- consistent with normal respiratory flora  Sacral wound cx 5/15> pseudomonas aeruginosa   Antimicrobials:  4/21 Vancomycin > 4/23 4/21 Zosyn > 4/26, 5/19-5/27  Interim history/subjective:  No events. Appears to have tolerated  decannulation well Attempts  to follow commands  Objective   Blood pressure 137/65, pulse 93, temperature 97.8 F (36.6 C), temperature source Axillary, resp. rate (!) 21, height 5\' 8"  (1.727 m), weight 74.2 kg, SpO2 100 %.        Intake/Output Summary (Last 24 hours) at 01/05/2020 0859 Last data filed at 01/05/2020 0518 Gross per 24 hour  Intake 1143 ml  Output 1726 ml  Net -583 ml   Filed Weights   01/03/20 0500 01/04/20 0500 01/05/20 0321  Weight: 72.5 kg 73.8 kg 74.2 kg    Examination: GEN: frail man on nasal cannula HEENT: Dressings in place over previous trach site  CV: S1-S2 appreciated PULM: Clear breath sounds GI: PEG in place, soft SKIN: Multiple areas of skin breakdown  WBC stable Cr stable BMP stable  Resolved Hospital Problem list   COVID-19 pneumonia -Has completed full course of remdesivir and dexamethasone, off isolation 4/23.  HCAP Pneumomediastinum/pneumopericardium Urinary retention VDRF  Assessment & Plan:  Acute respiratory failure with hypoxia, s/p tracheostomy in setting of COVID-19 pneumonia, deconditioning Post Covid pulmonary fibrosis -Tolerated decannulation well 5/29 -Need for sedation decreasing -Continue PT    Hyponatremia -Resolved  Goals of care -Work towards SNF  PCCM will sign off Reengage as needed  Sherrilyn Rist, MD [place] PCCM Pager: [phone]

## 2020-01-05 NOTE — Progress Notes (Signed)
PROGRESS NOTE    [name]  CZY:[id number] DOB: 07-Jan-1956 DOA: 11/17/2019 PCP: Lorre Munroe, NP   Brief Narrative:  HPI On 11/20/2019 by Dr. Montel Clock Rumbold is a 64 y.o. male with medical history significant of diabetes, hypertension, hyperlipidemia. COPD on chronic medical therapy and coronary artery disease s/p MI x 5, s/p 6 vessel CABG.  He has had URI symptoms for about a week and was seen by his PCP 3/230/21 for HA, cough, fever, myalgias, dysphagia and weakness. Denies loss of taste or smell, denied SOB. He was thought likely to have Covid 19  and was diagnosed with tested yesterday with a positive test. Today he had worsening shortness of breath and when EMS got there he was markedly hypoxic with oxygen saturations in the 60s. He was placed on nasal cannula and eventually placed on nonrebreather with nasal cannula as well as CPAP. EMS was able to improve his oxygen saturations to the low 90s but he was very lethargic and had poor color.He presented to [facility].   Interim history Admitted 4/2 with COVID pneumonia and delirium after 1 week of symptoms. Intubated on 4/5, required prolonged mechanical ventilatory support. Tracheostomy performed on 4/20, patient had multiple events during hospital stay, please see discussion below, but most notably work-up was significant for pneumomediastinum and pneumonia pericardium, are all has improved, he was liberated from the vent 5/20, tolerating trach collar, for last few days, been having significant delirium, agitation, where he required to be on Precedex, Precedex was weaned off 5/23, after he was started on multiple antipsychotic/sedative medications.  Assessment & Plan   Acute respiratory failure with hypoxia, in the setting of COVID-19 pneumonia. -With prolonged hospital stay, he is status post tracheostomy 4/20, currently weaned off the vent, tolerating trach collar for last few days. 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 -Management per PCCM . -He is currently tolerating trach collar, no vent requirement since 5/20  -Status post decannulation on 01/04/2020 -Currently on room air -Continue with Lasix 40 mg p.o. daily to prevent volume overload and decreasing free water  Toxic metabolic encephalopathy in setting of prolonged sedation, improving  Anxiety, agitated delirium from prolonged hospital stay, possible critical illness related ptsd  -anxiety manifesting as increased RR, pt endorses feels of panic, anxiety, sleep disturbance  -completed clonidine taper 5/19 -Precedex weaned off 5/23 -Continue with current regimen which is including BuSpar, Klonopin, and Seroquel, he was started on Zoloft today as well.  Infected sacral pressure ulcer, unstageable . -Wound culture growing Pseudomonas aeruginosa, continue with IV Zosyn , day 12 of 14 days course. -Continue with Foley catheter to avoid wound contamination.  Hypokalemia -Potassium 3.5 today, will continue to replace and monitor BMP  -magnesium 2.1  Tachycardia -sinus -likely due to agitation -Patient was on metoprolol at home -Continue metoprolol -Tachycardia improved  Resolved problems: COVID-19 pneumonia -Has completed full course of remdesivir and dexamethasone, off isolation 4/23.  HCAP Pneumomediastinum/pneumopericardium Urinary retention VDRF  DVT Prophylaxis  lovenox  Code Status: Full  Family Communication: none at bedside  Disposition Plan:  Status is: Inpatient  Remains inpatient appropriate because:Inpatient level of care appropriate due to severity of illness and patient is receiving IV antibiotics   Dispo: The patient is from: Home              Anticipated d/c is to: SNF              Anticipated d/c date  is: > 3 days              Patient currently is not medically stable to d/c. Looking for SNF   Consultants  PCCM  Procedures  04/05 Rt CVC > 4/24 04/16 Bronchoscopy   04/20Tracheostomy 4/24 Rt PICC 5/20 Echocardiogram  Significant events 4/19: tachypneic overnight and this am. Unable to wean sedation. Tracheostomy done  4/20:successful trach yesterday but de-recruited and on some escalated settings today. Remains tachypneic this am. Hypoglycemic yesterday and lantus changed 4/22:started on empiric abx but resp cx with abundant gpc. Improving oxygenation requirement but remains with rass -4 despite light sedation.  4/23:mucous plugging overnight and this am with copious/thick secretions.  4/28: issues overnight with increased vent settings. Still without purposeful responses. Fever overnight as well to 101.1. remains minimally responsive and unable to wean iv sedation except for short periods, despite increase in po agents.  4/29: somewhat improved settings overnight on 60/8. Still with temps overnight to 100.7 5/1: Continued improvement in vent requirement 50/8, remains on several sedating medications including continuous fentanyl and precedex. Trach changed to 6.0 shiley XLT. 5/7 tolerating pressure support ventilation while on sedation 5/16 off sedative infusions and on to trach collar. Tolerated x 3 hours 5/17 CPAP overnight. PRN APAP for fevers.  5/18 No issues overnight, placed on ATC this AM and tolerating well  5/20 Remained on ATC overnight, mild agitation noted overnight with addition of low dose precedex drip this resolved  5/23 Precedex weaned off 5/29 Decannulated  Antibiotics   Anti-infectives (From admission, onward)   Start     Dose/Rate Route Frequency Ordered Stop   12/24/19 1000  piperacillin-tazobactam (ZOSYN) IVPB 3.375 g     3.375 g 12.5 mL/hr over 240 Minutes Intravenous Every 8 hours 12/24/19 0908 01/03/20 0923   12/23/19 1000  Ampicillin-Sulbactam (UNASYN) 3 g in sodium chloride 0.9 % 100 mL IVPB  Status:  Discontinued     3 g 200 mL/hr over 30 Minutes Intravenous Every 8 hours 12/23/19 0914 12/23/19 0949   12/23/19  1000  doxycycline (VIBRAMYCIN) 100 mg in sodium chloride 0.9 % 250 mL IVPB  Status:  Discontinued     100 mg 125 mL/hr over 120 Minutes Intravenous Every 12 hours 12/23/19 0914 12/23/19 0949   12/22/19 1100  vancomycin (VANCOCIN) IVPB 1000 mg/200 mL premix  Status:  Discontinued     1,000 mg 200 mL/hr over 60 Minutes Intravenous Every 12 hours 12/21/19 2134 12/22/19 0930   12/21/19 2230  vancomycin (VANCOREADY) IVPB 1500 mg/300 mL     1,500 mg 150 mL/hr over 120 Minutes Intravenous  Once 12/21/19 2134 12/22/19 0026   12/21/19 2230  meropenem (MERREM) 1 g in sodium chloride 0.9 % 100 mL IVPB  Status:  Discontinued     1 g 200 mL/hr over 30 Minutes Intravenous Every 8 hours 12/21/19 2134 12/22/19 0930   12/19/19 0600  vancomycin (VANCOCIN) IVPB 1000 mg/200 mL premix     1,000 mg 200 mL/hr over 60 Minutes Intravenous To Radiology 12/18/19 1145 12/20/19 0600   11/28/19 0200  vancomycin (VANCOREADY) IVPB 1250 mg/250 mL  Status:  Discontinued     1,250 mg 166.7 mL/hr over 90 Minutes Intravenous Every 12 hours 11/27/19 1235 11/29/19 0930   11/27/19 1330  piperacillin-tazobactam (ZOSYN) IVPB 3.375 g     3.375 g 12.5 mL/hr over 240 Minutes Intravenous Every 8 hours 11/27/19 1232 12/04/19 0139   11/27/19 1330  vancomycin (VANCOREADY) IVPB 1500 mg/300 mL  1,500 mg 150 mL/hr over 120 Minutes Intravenous  Once 11/27/19 1233 11/27/19 1729   11/09/19 2000  cefTRIAXone (ROCEPHIN) 2 g in sodium chloride 0.9 % 100 mL IVPB     2 g 200 mL/hr over 30 Minutes Intravenous Every 24 hours 11/09/19 0727 11/12/19 2027   11/09/19 1000  remdesivir 100 mg in sodium chloride 0.9 % 100 mL IVPB     100 mg 200 mL/hr over 30 Minutes Intravenous Daily 12/03/2019 1853 11/12/19 1132   11/09/19 0800  azithromycin (ZITHROMAX) 500 mg in sodium chloride 0.9 % 250 mL IVPB     500 mg 250 mL/hr over 60 Minutes Intravenous Every 24 hours 11/09/19 0727 11/11/19 1527   11/24/2019 1945  cefTRIAXone (ROCEPHIN) 2 g in sodium chloride  0.9 % 100 mL IVPB  Status:  Discontinued     2 g 200 mL/hr over 30 Minutes Intravenous Every 24 hours 11/14/2019 1940 11/09/19 0727   11/26/2019 1930  remdesivir 200 mg in sodium chloride 0.9% 250 mL IVPB     200 mg 580 mL/hr over 30 Minutes Intravenous Once 11/28/2019 1853 11/15/2019 2204      Subjective:   Abdulah Fischetti seen and examined today.  No issues this morning.  Patient states he is not feeling well but cannot expand on this.  He can answer simple questions. Objective:   Vitals:   01/04/20 2036 01/04/20 2309 01/05/20 0321 01/05/20 0741  BP: (!) 155/74 134/81 (!) 146/75 137/65  Pulse: 82   93  Resp: (!) 22   (!) 21  Temp:  98.2 F (36.8 C) 98 F (36.7 C) 97.8 F (36.6 C)  TempSrc:  Axillary Axillary Axillary  SpO2:    100%  Weight:   74.2 kg   Height:        Intake/Output Summary (Last 24 hours) at 01/05/2020 1147 Last data filed at 01/05/2020 6644 Gross per 24 hour  Intake 1143 ml  Output 1526 ml  Net -383 ml   Filed Weights   01/03/20 0500 01/04/20 0500 01/05/20 0321  Weight: 72.5 kg 73.8 kg 74.2 kg   Exam  General: Well developed, chronically ill appearing, NAD  HEENT: NCAT, mucous membranes moist.   Neck: In place over previous trach site  Cardiovascular: S1 S2 auscultated, tachycardic  Respiratory: Breath sounds are diminished however clear  Abdomen: Soft, nontender, nondistended, + bowel sounds, peg  Extremities: warm dry without cyanosis clubbing, muscle wasting   Neuro: Awake and alert, follows commands, moves all extremities  Data Reviewed: I have personally reviewed following labs and imaging studies  CBC: Recent Labs  Lab 12/31/19 0539 01/01/20 0430  WBC 19.8* 21.1*  HGB 11.2* 10.6*  HCT 36.2* 34.6*  MCV 90.7 89.4  PLT 362 332   Basic Metabolic Panel: Recent Labs  Lab 01/01/20 0430 01/01/20 0709 01/02/20 0451 01/03/20 1048 01/04/20 0500 01/05/20 0635  NA 136  --  137 143 144 142  K 3.4*  --  3.6 3.5 3.2* 3.5  CL 89*  --  91* 96*  99 105  CO2 35*  --  31 34* 34* 29  GLUCOSE 164*  --  234* 263* 234* 226*  BUN 50*  --  48* 51* 44* 37*  CREATININE 0.56*  --  0.61 0.66 0.47* 0.45*  CALCIUM 10.0  --  10.1 10.2 10.0 9.8  MG  --  2.1 2.1 2.0 2.1 2.1   GFR: Estimated Creatinine Clearance: 89.1 mL/min (A) (by C-G formula based on SCr of 0.45 mg/dL (  L)). Liver Function Tests: No results for input(s): AST, ALT, ALKPHOS, BILITOT, PROT, ALBUMIN in the last 168 hours. No results for input(s): LIPASE, AMYLASE in the last 168 hours. No results for input(s): AMMONIA in the last 168 hours. Coagulation Profile: No results for input(s): INR, PROTIME in the last 168 hours. Cardiac Enzymes: No results for input(s): CKTOTAL, CKMB, CKMBINDEX, TROPONINI in the last 168 hours. BNP (last 3 results) No results for input(s): PROBNP in the last 8760 hours. HbA1C: No results for input(s): HGBA1C in the last 72 hours. CBG: Recent Labs  Lab 01/04/20 1620 01/04/20 1958 01/04/20 2308 01/05/20 0329 01/05/20 0805  GLUCAP 184* 210* 179* 161* 237*   Lipid Profile: No results for input(s): CHOL, HDL, LDLCALC, TRIG, CHOLHDL, LDLDIRECT in the last 72 hours. Thyroid Function Tests: No results for input(s): TSH, T4TOTAL, FREET4, T3FREE, THYROIDAB in the last 72 hours. Anemia Panel: No results for input(s): VITAMINB12, FOLATE, FERRITIN, TIBC, IRON, RETICCTPCT in the last 72 hours. Urine analysis:    Component Value Date/Time   COLORURINE YELLOW 12/21/2019 0331   APPEARANCEUR CLOUDY (A) 12/21/2019 0331   LABSPEC 1.020 12/21/2019 0331   PHURINE 5.0 12/21/2019 0331   GLUCOSEU NEGATIVE 12/21/2019 0331   HGBUR NEGATIVE 12/21/2019 0331   BILIRUBINUR NEGATIVE 12/21/2019 0331   KETONESUR NEGATIVE 12/21/2019 0331   PROTEINUR NEGATIVE 12/21/2019 0331   UROBILINOGEN 1.0 03/20/2014 1606   NITRITE NEGATIVE 12/21/2019 0331   LEUKOCYTESUR LARGE (A) 12/21/2019 0331   Sepsis Labs: @LABRCNTIP (procalcitonin:4,lacticidven:4)  ) No results found for  this or any previous visit (from the past 240 hour(s)).    Radiology Studies: No results found.   Scheduled Meds: . aspirin  81 mg Per Tube Daily  . busPIRone  10 mg Per Tube BID  . chlorhexidine  15 mL Mouth Rinse BID  . Chlorhexidine Gluconate Cloth  6 each Topical Q0600  . clonazePAM  0.25 mg Per Tube BID  . enoxaparin (LOVENOX) injection  40 mg Subcutaneous Q24H  . feeding supplement (PRO-STAT SUGAR FREE 64)  30 mL Per Tube TID  . fentaNYL  1 patch Transdermal Q72H  . fluticasone  2 spray Each Nare QHS  . free water  200 mL Per Tube Q8H  . gabapentin  200 mg Per Tube Q8H  . insulin aspart  0-15 Units Subcutaneous Q4H  . insulin aspart  6 Units Subcutaneous Q4H  . insulin glargine  25 Units Subcutaneous BID  . linagliptin  5 mg Per Tube Daily  . mouth rinse  15 mL Mouth Rinse q12n4p  . metoprolol tartrate  25 mg Per Tube BID  . multivitamin with minerals  1 tablet Per Tube Daily  . nutrition supplement (JUVEN)  1 packet Per Tube BID  . pantoprazole sodium  40 mg Per Tube Daily  . QUEtiapine  100 mg Per Tube Daily  . QUEtiapine  200 mg Per Tube QHS  . sertraline  50 mg Oral QHS  . simvastatin  40 mg Per Tube QHS  . thiamine  100 mg Per Tube Daily   Continuous Infusions: . sodium chloride Stopped (12/30/19 2129)  . feeding supplement (OSMOLITE 1.5 CAL) 1,000 mL (01/05/20 0921)     LOS: 58 days   Time Spent in minutes   30 minutes  Kimblery Diop D.O. on 01/05/2020 at 11:47 AM  Between 7am to 7pm - Please see pager noted on [email or web address]  After 7pm go to [email or web address]  And look for the night coverage person covering for me after hours  [place]  [phone]

## 2020-01-06 LAB — MAGNESIUM: Magnesium: 2.4 mg/dL (ref 1.7–2.4)

## 2020-01-06 LAB — BASIC METABOLIC PANEL
Anion gap: 9 (ref 5–15)
BUN: 37 mg/dL — ABNORMAL HIGH (ref 8–23)
CO2: 30 mmol/L (ref 22–32)
Calcium: 10.1 mg/dL (ref 8.9–10.3)
Chloride: 108 mmol/L (ref 98–111)
Creatinine, Ser: 0.45 mg/dL — ABNORMAL LOW (ref 0.61–1.24)
GFR calc Af Amer: >60 mL/min (ref >60)
GFR calc non Af Amer: >60 mL/min (ref >60)
Glucose, Bld: 175 mg/dL — ABNORMAL HIGH (ref 70–99)
Potassium: 3.9 mmol/L (ref 3.5–5.1)
Sodium: 147 mmol/L — ABNORMAL HIGH (ref 135–145)

## 2020-01-06 LAB — GLUCOSE, CAPILLARY
Glucose-Capillary: 118 mg/dL — ABNORMAL HIGH (ref 70–99)
Glucose-Capillary: 152 mg/dL — ABNORMAL HIGH (ref 70–99)
Glucose-Capillary: 175 mg/dL — ABNORMAL HIGH (ref 70–99)
Glucose-Capillary: 178 mg/dL — ABNORMAL HIGH (ref 70–99)
Glucose-Capillary: 229 mg/dL — ABNORMAL HIGH (ref 70–99)
Glucose-Capillary: 262 mg/dL — ABNORMAL HIGH (ref 70–99)

## 2020-01-06 MED ORDER — FREE WATER
100.0000 mL | Freq: Once | Status: AC
  Administered 2020-01-06: 100 mL

## 2020-01-06 NOTE — Progress Notes (Signed)
Inpatient Rehab Admissions:  Inpatient Rehab Consult received. I met with pt and his wife at the bedside for rehabilitation assessment. Pt was notably distracted and appeared encephalopathic. In supine, HR slightly elevated on the monitor and his RR appeared elevated. Pt minimally participatory in my assessment. Based on my bedside assessment and pt's previous therapy notes, I would agree with PT/OT recommendation for SNF placement. I discussed this extensively with pt's wife and reasons why pt is not a good candidate for CIR at this time. Pt's wife verbalized understanding.   I will sign off and will communicate my recommendations to [facility] team. Please call if questions.   Raechel Ache, OTR/L  Rehab Admissions Coordinator  [phone] 01/06/2020 1:58 PM

## 2020-01-06 NOTE — Plan of Care (Signed)
  Problem: Clinical Measurements: Goal: Respiratory complications will improve Outcome: Progressing   Problem: Nutrition: Goal: Adequate nutrition will be maintained Outcome: Progressing   

## 2020-01-06 NOTE — Progress Notes (Addendum)
PROGRESS NOTE    [name]  OZD:[id number] DOB: August 05, 1956 DOA: 11/25/2019 PCP: Lorre Munroe, NP   Brief Narrative:  HPI On 12/04/2019 by Dr. Montel Clock [name] is a 64 y.o. male with medical history significant of diabetes, hypertension, hyperlipidemia. COPD on chronic medical therapy and coronary artery disease s/p MI x 5, s/p 6 vessel CABG.  He has had URI symptoms for about a week and was seen by his PCP 3/230/21 for HA, cough, fever, myalgias, dysphagia and weakness. Denies loss of taste or smell, denied SOB. He was thought likely to have Covid 19  and was diagnosed with tested yesterday with a positive test. Today he had worsening shortness of breath and when EMS got there he was markedly hypoxic with oxygen saturations in the 60s. He was placed on nasal cannula and eventually placed on nonrebreather with nasal cannula as well as CPAP. EMS was able to improve his oxygen saturations to the low 90s but he was very lethargic and had poor color.He presented to [facility].   Interim history Admitted 4/2 with COVID pneumonia and delirium after 1 week of symptoms. Intubated on 4/5, required prolonged mechanical ventilatory support. Tracheostomy performed on 4/20, patient had multiple events during hospital stay, please see discussion below, but most notably work-up was significant for pneumomediastinum and pneumonia pericardium, are all has improved, he was liberated from the vent 5/20, tolerating trach collar, for last few days, been having significant delirium, agitation, where he required to be on Precedex, Precedex was weaned off 5/23, after he was started on multiple antipsychotic/sedative medications.  Assessment & Plan   Acute respiratory failure with hypoxia, in the setting of COVID-19 pneumonia. -With prolonged hospital stay, he is status post tracheostomy 4/20, currently weaned off the vent, tolerating trach collar for last few days. 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 -Management per PCCM . -He is currently tolerating trach collar, no vent requirement since 5/20  -Status post decannulation on 01/04/2020 -Continue with Lasix 40 mg p.o. daily to prevent volume overload and decreasing free water -This morning patient appeared to be dyspneic and had coarse breath sounds.  He was attempting to cough.  Respiratory called, patient did cough and they were able to suction out mucus. -Patient maintaining on saturations well in the 90s on 2 L of supplemental oxygen.  Toxic metabolic encephalopathy in setting of prolonged sedation, improving  Anxiety, agitated delirium from prolonged hospital stay, possible critical illness related ptsd  -anxiety manifesting as increased RR, pt endorses feels of panic, anxiety, sleep disturbance  -completed clonidine taper 5/19 -Precedex weaned off 5/23 -Continue with current regimen which is including BuSpar, Klonopin, and Seroquel, he was started on Zoloft today as well.  Infected sacral pressure ulcer, unstageable . -Wound culture growing Pseudomonas aeruginosa, continue with IV Zosyn , day 12 of 14 days course. -Continue with Foley catheter to avoid wound contamination.  Hypokalemia -Resolved with supplementation -magnesium 2.1  Tachycardia -sinus -likely due to agitation -Patient was on metoprolol at home -Continue metoprolol -Tachycardia improved   Mild hypernatremia -Sodium 147 today -Will give supplemental free water and continue to monitor BMP   Resolved problems: COVID-19 pneumonia -Has completed full course of remdesivir and dexamethasone, off isolation 4/23.  HCAP Pneumomediastinum/pneumopericardium Urinary retention VDRF  DVT Prophylaxis  lovenox  Code Status: Full  Family Communication: none at bedside  Disposition Plan:  Status is: Inpatient  Remains inpatient appropriate because:Inpatient level of care appropriate due  to severity of illness and patient is  receiving IV antibiotics   Dispo: The patient is from: Home              Anticipated d/c is to: SNF              Anticipated d/c date is: > 3 days              Patient currently is not medically stable to d/c. Looking for SNF (patient's wife would like CIR consultation)   Consultants  PCCM  Procedures  04/05 Rt CVC > 4/24 04/16 Bronchoscopy  04/20Tracheostomy 4/24 Rt PICC 5/20 Echocardiogram  Significant events 4/19: tachypneic overnight and this am. Unable to wean sedation. Tracheostomy done  4/20:successful trach yesterday but de-recruited and on some escalated settings today. Remains tachypneic this am. Hypoglycemic yesterday and lantus changed 4/22:started on empiric abx but resp cx with abundant gpc. Improving oxygenation requirement but remains with rass -4 despite light sedation.  4/23:mucous plugging overnight and this am with copious/thick secretions.  4/28: issues overnight with increased vent settings. Still without purposeful responses. Fever overnight as well to 101.1. remains minimally responsive and unable to wean iv sedation except for short periods, despite increase in po agents.  4/29: somewhat improved settings overnight on 60/8. Still with temps overnight to 100.7 5/1: Continued improvement in vent requirement 50/8, remains on several sedating medications including continuous fentanyl and precedex. Trach changed to 6.0 shiley XLT. 5/7 tolerating pressure support ventilation while on sedation 5/16 off sedative infusions and on to trach collar. Tolerated x 3 hours 5/17 CPAP overnight. PRN APAP for fevers.  5/18 No issues overnight, placed on ATC this AM and tolerating well  5/20 Remained on ATC overnight, mild agitation noted overnight with addition of low dose precedex drip this resolved  5/23 Precedex weaned off 5/29 Decannulated  Antibiotics   Anti-infectives (From admission, onward)   Start     Dose/Rate Route Frequency Ordered Stop   12/24/19 1000   piperacillin-tazobactam (ZOSYN) IVPB 3.375 g     3.375 g 12.5 mL/hr over 240 Minutes Intravenous Every 8 hours 12/24/19 0908 01/03/20 0923   12/23/19 1000  Ampicillin-Sulbactam (UNASYN) 3 g in sodium chloride 0.9 % 100 mL IVPB  Status:  Discontinued     3 g 200 mL/hr over 30 Minutes Intravenous Every 8 hours 12/23/19 0914 12/23/19 0949   12/23/19 1000  doxycycline (VIBRAMYCIN) 100 mg in sodium chloride 0.9 % 250 mL IVPB  Status:  Discontinued     100 mg 125 mL/hr over 120 Minutes Intravenous Every 12 hours 12/23/19 0914 12/23/19 0949   12/22/19 1100  vancomycin (VANCOCIN) IVPB 1000 mg/200 mL premix  Status:  Discontinued     1,000 mg 200 mL/hr over 60 Minutes Intravenous Every 12 hours 12/21/19 2134 12/22/19 0930   12/21/19 2230  vancomycin (VANCOREADY) IVPB 1500 mg/300 mL     1,500 mg 150 mL/hr over 120 Minutes Intravenous  Once 12/21/19 2134 12/22/19 0026   12/21/19 2230  meropenem (MERREM) 1 g in sodium chloride 0.9 % 100 mL IVPB  Status:  Discontinued     1 g 200 mL/hr over 30 Minutes Intravenous Every 8 hours 12/21/19 2134 12/22/19 0930   12/19/19 0600  vancomycin (VANCOCIN) IVPB 1000 mg/200 mL premix     1,000 mg 200 mL/hr over 60 Minutes Intravenous To Radiology 12/18/19 1145 12/20/19 0600   11/28/19 0200  vancomycin (VANCOREADY) IVPB 1250 mg/250 mL  Status:  Discontinued     1,250  mg 166.7 mL/hr over 90 Minutes Intravenous Every 12 hours 11/27/19 1235 11/29/19 0930   11/27/19 1330  piperacillin-tazobactam (ZOSYN) IVPB 3.375 g     3.375 g 12.5 mL/hr over 240 Minutes Intravenous Every 8 hours 11/27/19 1232 12/04/19 0139   11/27/19 1330  vancomycin (VANCOREADY) IVPB 1500 mg/300 mL     1,500 mg 150 mL/hr over 120 Minutes Intravenous  Once 11/27/19 1233 11/27/19 1729   11/09/19 2000  cefTRIAXone (ROCEPHIN) 2 g in sodium chloride 0.9 % 100 mL IVPB     2 g 200 mL/hr over 30 Minutes Intravenous Every 24 hours 11/09/19 0727 11/12/19 2027   11/09/19 1000  remdesivir 100 mg in sodium  chloride 0.9 % 100 mL IVPB     100 mg 200 mL/hr over 30 Minutes Intravenous Daily 12/02/2019 1853 11/12/19 1132   11/09/19 0800  azithromycin (ZITHROMAX) 500 mg in sodium chloride 0.9 % 250 mL IVPB     500 mg 250 mL/hr over 60 Minutes Intravenous Every 24 hours 11/09/19 0727 11/11/19 1527   12/03/2019 1945  cefTRIAXone (ROCEPHIN) 2 g in sodium chloride 0.9 % 100 mL IVPB  Status:  Discontinued     2 g 200 mL/hr over 30 Minutes Intravenous Every 24 hours 11/23/2019 1940 11/09/19 0727   11/11/2019 1930  remdesivir 200 mg in sodium chloride 0.9% 250 mL IVPB     200 mg 580 mL/hr over 30 Minutes Intravenous Once 11/30/2019 1853 11/21/2019 2204      Subjective:   Adric Hardcastle seen and examined today.  Patient appeared to be dyspneic.  Can follow some commands and communicate minimally. Objective:   Vitals:   01/05/20 2055 01/05/20 2323 01/06/20 0334 01/06/20 0800  BP:  (!) 157/89 (!) 158/86 (!) 165/64  Pulse:    (!) 117  Resp: 20 20 20 20   Temp:  98.6 F (37 C) 98.9 F (37.2 C) 98.3 F (36.8 C)  TempSrc:  Axillary Axillary Axillary  SpO2: 100%   98%  Weight:   76.6 kg   Height:        Intake/Output Summary (Last 24 hours) at 01/06/2020 1048 Last data filed at 01/06/2020 0400 Gross per 24 hour  Intake 1119 ml  Output 1550 ml  Net -431 ml   Filed Weights   01/04/20 0500 01/05/20 0321 01/06/20 0334  Weight: 73.8 kg 74.2 kg 76.6 kg   Exam  General: Well developed, chronically ill appearing, NAD  HEENT: NCAT, mucous membranes moist.   Neck: In place over previous trach site  Cardiovascular: S1 S2 auscultated, tachycardic  Respiratory: Coarse breath sounds, tachypneic, increased work of breathing  Abdomen: Soft, nontender, nondistended, + bowel sounds, peg  Extremities: warm dry without cyanosis clubbing, muscle wasting   Neuro: Awake and alert, follows commands, moves all extremities  Data Reviewed: I have personally reviewed following labs and imaging studies  CBC: Recent Labs   Lab 12/31/19 0539 01/01/20 0430  WBC 19.8* 21.1*  HGB 11.2* 10.6*  HCT 36.2* 34.6*  MCV 90.7 89.4  PLT 362 332   Basic Metabolic Panel: Recent Labs  Lab 01/02/20 0451 01/03/20 1048 01/04/20 0500 01/05/20 0635 01/06/20 0406  NA 137 143 144 142 147*  K 3.6 3.5 3.2* 3.5 3.9  CL 91* 96* 99 105 108  CO2 31 34* 34* 29 30  GLUCOSE 234* 263* 234* 226* 175*  BUN 48* 51* 44* 37* 37*  CREATININE 0.61 0.66 0.47* 0.45* 0.45*  CALCIUM 10.1 10.2 10.0 9.8 10.1  MG 2.1 2.0  2.1 2.1 2.4   GFR: Estimated Creatinine Clearance: 89.1 mL/min (A) (by C-G formula based on SCr of 0.45 mg/dL (L)). Liver Function Tests: No results for input(s): AST, ALT, ALKPHOS, BILITOT, PROT, ALBUMIN in the last 168 hours. No results for input(s): LIPASE, AMYLASE in the last 168 hours. No results for input(s): AMMONIA in the last 168 hours. Coagulation Profile: No results for input(s): INR, PROTIME in the last 168 hours. Cardiac Enzymes: No results for input(s): CKTOTAL, CKMB, CKMBINDEX, TROPONINI in the last 168 hours. BNP (last 3 results) No results for input(s): PROBNP in the last 8760 hours. HbA1C: No results for input(s): HGBA1C in the last 72 hours. CBG: Recent Labs  Lab 01/05/20 1601 01/05/20 2006 01/05/20 2329 01/06/20 0339 01/06/20 0720  GLUCAP 157* 103* 192* 178* 175*   Lipid Profile: No results for input(s): CHOL, HDL, LDLCALC, TRIG, CHOLHDL, LDLDIRECT in the last 72 hours. Thyroid Function Tests: No results for input(s): TSH, T4TOTAL, FREET4, T3FREE, THYROIDAB in the last 72 hours. Anemia Panel: No results for input(s): VITAMINB12, FOLATE, FERRITIN, TIBC, IRON, RETICCTPCT in the last 72 hours. Urine analysis:    Component Value Date/Time   COLORURINE YELLOW 12/21/2019 0331   APPEARANCEUR CLOUDY (A) 12/21/2019 0331   LABSPEC 1.020 12/21/2019 0331   PHURINE 5.0 12/21/2019 0331   GLUCOSEU NEGATIVE 12/21/2019 0331   HGBUR NEGATIVE 12/21/2019 0331   BILIRUBINUR NEGATIVE 12/21/2019 0331    KETONESUR NEGATIVE 12/21/2019 0331   PROTEINUR NEGATIVE 12/21/2019 0331   UROBILINOGEN 1.0 03/20/2014 1606   NITRITE NEGATIVE 12/21/2019 0331   LEUKOCYTESUR LARGE (A) 12/21/2019 0331   Sepsis Labs: @LABRCNTIP (procalcitonin:4,lacticidven:4)  ) Recent Results (from the past 240 hour(s))  MRSA PCR Screening     Status: None   Collection Time: 01/05/20 10:32 AM   Specimen: Nasopharyngeal  Result Value Ref Range Status   MRSA by PCR NEGATIVE NEGATIVE Final    Comment:        The GeneXpert MRSA Assay (FDA approved for NASAL specimens only), is one component of a comprehensive MRSA colonization surveillance program. It is not intended to diagnose MRSA infection nor to guide or monitor treatment for MRSA infections. Performed at [facility], [place] [address], [place],  [address]       Radiology Studies: No results found.   Scheduled Meds: . aspirin  81 mg Per Tube Daily  . busPIRone  10 mg Per Tube BID  . chlorhexidine  15 mL Mouth Rinse BID  . Chlorhexidine Gluconate Cloth  6 each Topical Q0600  . clonazePAM  0.25 mg Per Tube BID  . enoxaparin (LOVENOX) injection  40 mg Subcutaneous Q24H  . feeding supplement (PRO-STAT SUGAR FREE 64)  30 mL Per Tube TID  . fentaNYL  1 patch Transdermal Q72H  . fluticasone  2 spray Each Nare QHS  . free water  200 mL Per Tube Q8H  . gabapentin  200 mg Per Tube Q8H  . insulin aspart  0-15 Units Subcutaneous Q4H  . insulin aspart  6 Units Subcutaneous Q4H  . insulin glargine  25 Units Subcutaneous BID  . linagliptin  5 mg Per Tube Daily  . mouth rinse  15 mL Mouth Rinse q12n4p  . metoprolol tartrate  25 mg Per Tube BID  . multivitamin with minerals  1 tablet Per Tube Daily  . nutrition supplement (JUVEN)  1 packet Per Tube BID  . pantoprazole sodium  40 mg Per Tube Daily  . QUEtiapine  100 mg Per Tube Daily  .  QUEtiapine  200 mg Per Tube QHS  . sertraline  50 mg Oral QHS  . simvastatin  40 mg Per Tube QHS  . thiamine   100 mg Per Tube Daily   Continuous Infusions: . sodium chloride Stopped (12/30/19 2129)  . feeding supplement (OSMOLITE 1.5 CAL) 1,000 mL (01/05/20 0921)     LOS: 59 days   Time Spent in minutes   30 minutes  Maryann Mikhail D.O. on 01/06/2020 at 10:48 AM  Between 7am to 7pm - Please see pager noted on [email or web address]  After 7pm go to [email or web address]  And look for the night coverage person covering for me after hours  [facility] Office  [phone]

## 2020-01-06 NOTE — Consult Note (Signed)
WOC Nurse wound follow up Patient receiving care in [facility] [id number].  Assisted with turning by NT. 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   Scratch marks to skin are resolved. No evidence of fungal rash. I have ordered a low air loss mattress, and use of existing Prevalon boots to the POC. Monitor the wound area(s) for worsening of condition such as: Signs/symptoms of infection,  Increase in size,  Development of or worsening of odor, Development of pain, or increased pain at the affected locations.  Notify the medical team if any of these develop.  Helmut Muster, RN, MSN, CWOCN, CNS-BC, pager [phone]

## 2020-01-06 NOTE — Progress Notes (Signed)
   01/06/20 0800  Assess: MEWS Score  Temp 98.3 F (36.8 C) (Simultaneous filing. User Fussell not have seen previous data.)  BP (!) 165/64  Pulse Rate (!) 117  ECG Heart Rate (!) 113  Resp 20 (Simultaneous filing. User Barsamian not have seen previous data.)  Level of Consciousness Alert  SpO2 98 %  Assess: MEWS Score  MEWS Temp 0  MEWS Systolic 0  MEWS Pulse 2  MEWS RR 0  MEWS LOC 0  MEWS Score 2  MEWS Score Color Yellow  Assess: if the MEWS score is Yellow or Red  Were vital signs taken at a resting state? Yes  Focused Assessment Documented focused assessment  Early Detection of Sepsis Score *See Row Information* Low  MEWS guidelines implemented *See Row Information* Yes  Treat  MEWS Interventions Administered scheduled meds/treatments  Take Vital Signs  Increase Vital Sign Frequency  Yellow: Q 2hr X 2 then Q 4hr X 2, if remains yellow, continue Q 4hrs  Escalate  MEWS: Escalate Yellow: discuss with charge nurse/RN and consider discussing with provider and RRT  Notify: Charge Nurse/RN  Name of Charge Nurse/RN Notified Erin,RN  Date Charge Nurse/RN Notified 01/06/20  Time Charge Nurse/RN Notified 0800  Notify: Provider  Provider Name/Title Carter Kitten  Date Provider Notified 01/06/20  Time Provider Notified 0800  Notification Type Call  Notification Reason Change in status  Response No new orders  Date of Provider Response 01/06/20  Time of Provider Response 0805  Document  Patient Outcome Other (Comment) (NO CHANGES;)

## 2020-01-06 NOTE — TOC Progression Note (Addendum)
Transition of Care [facility]) - Progression Note    Patient Details  Name: [name] MRN: [id number] Date of Birth: [birth year]  Transition of Care [facility]) CM/SW [place], [place] Phone Number: 01/06/2020, 1:17 PM  Clinical Narrative:     CSW met with patient and patient's wife Sybil bedside to discuss discharge plan. CSW informed patient's wife [facility] was unable to make a bed offer. Wife is still interested in [place] but agreeable to patient being faxed out to [facility] SNFs. TOC team will continue to follow-up.  Expected Discharge Plan: Skilled Nursing Facility Barriers to Discharge: Continued Medical Work up, Ship broker, Environmental education officer)  Expected Discharge Plan and Services Expected Discharge Plan: [place] In-house Referral: Clinical Social Work   Post Acute Care Choice: [place] Living arrangements for the past 2 months: [place] Determinants of Health (SDOH) Interventions    Readmission Risk Interventions Readmission Risk Prevention Plan 01/02/2020  Transportation Screening Complete  Medication Review Press photographer) Complete  PCP or Specialist appointment within 3-5 days of discharge Complete  HRI or Home Care Consult Complete  SW Recovery Care/Counseling Consult Complete  Palliative Care Screening Complete  Skilled Nursing Facility Complete  Some recent data might be hidden

## 2020-01-07 ENCOUNTER — Inpatient Hospital Stay (HOSPITAL_COMMUNITY): Payer: Medicare Other

## 2020-01-07 ENCOUNTER — Ambulatory Visit: Payer: BLUE CROSS/BLUE SHIELD | Admitting: Internal Medicine

## 2020-01-07 ENCOUNTER — Other Ambulatory Visit: Payer: Self-pay | Admitting: Cardiology

## 2020-01-07 LAB — GLUCOSE, CAPILLARY
Glucose-Capillary: 211 mg/dL — ABNORMAL HIGH (ref 70–99)
Glucose-Capillary: 159 mg/dL — ABNORMAL HIGH (ref 70–99)
Glucose-Capillary: 144 mg/dL — ABNORMAL HIGH (ref 70–99)
Glucose-Capillary: 167 mg/dL — ABNORMAL HIGH (ref 70–99)

## 2020-01-07 LAB — CBC
HCT: 41.9 % (ref 39.0–52.0)
Hemoglobin: 12.1 g/dL — ABNORMAL LOW (ref 13.0–17.0)
MCH: 26.8 pg (ref 26.0–34.0)
MCHC: 28.9 g/dL — ABNORMAL LOW (ref 30.0–36.0)
MCV: 92.7 fL (ref 80.0–100.0)
Platelets: 484 10*3/uL — ABNORMAL HIGH (ref 150–400)
RBC: 4.52 MIL/uL (ref 4.22–5.81)
RDW: 16.2 % — ABNORMAL HIGH (ref 11.5–15.5)
WBC: 16.8 10*3/uL — ABNORMAL HIGH (ref 4.0–10.5)
nRBC: 0 % (ref 0.0–0.2)

## 2020-01-07 LAB — BASIC METABOLIC PANEL
Anion gap: 10 (ref 5–15)
BUN: 49 mg/dL — ABNORMAL HIGH (ref 8–23)
CO2: 31 mmol/L (ref 22–32)
Calcium: 10 mg/dL (ref 8.9–10.3)
Chloride: 108 mmol/L (ref 98–111)
Creatinine, Ser: 0.72 mg/dL (ref 0.61–1.24)
GFR calc Af Amer: >60 mL/min (ref >60)
GFR calc non Af Amer: >60 mL/min (ref >60)
Glucose, Bld: 225 mg/dL — ABNORMAL HIGH (ref 70–99)
Potassium: 4.6 mmol/L (ref 3.5–5.1)
Sodium: 149 mmol/L — ABNORMAL HIGH (ref 135–145)

## 2020-01-07 LAB — URINALYSIS, ROUTINE W REFLEX MICROSCOPIC
Bilirubin Urine: NEGATIVE
Glucose, UA: NEGATIVE mg/dL
Hgb urine dipstick: NEGATIVE
Ketones, ur: NEGATIVE mg/dL
Leukocytes,Ua: NEGATIVE
Nitrite: NEGATIVE
Protein, ur: NEGATIVE mg/dL
Specific Gravity, Urine: 1.02 (ref 1.005–1.030)
pH: 5 (ref 5.0–8.0)

## 2020-01-07 MED ORDER — POLYVINYL ALCOHOL 1.4 % OP SOLN: 1.0000 [drp] | Freq: Four times a day (QID) | OPHTHALMIC | Status: DC | PRN

## 2020-01-07 MED ORDER — SODIUM CHLORIDE 0.9 % IV SOLN
3.0000 g | Freq: Four times a day (QID) | INTRAVENOUS | Status: DC
  Filled 2020-01-07 (×3): qty 8

## 2020-01-07 MED ORDER — DIPHENHYDRAMINE HCL 50 MG/ML IJ SOLN: 25.0000 mg | INTRAMUSCULAR | Status: DC | PRN

## 2020-01-07 MED ORDER — FREE WATER
200.0000 mL | Freq: Four times a day (QID) | Status: DC
  Administered 2020-01-07: 200 mL

## 2020-01-07 MED ORDER — MORPHINE SULFATE (PF) 2 MG/ML IV SOLN: 2.0000 mg | INTRAVENOUS | Status: DC | PRN

## 2020-01-07 MED ORDER — MORPHINE BOLUS VIA INFUSION
5.0000 mg | INTRAVENOUS | Status: DC | PRN
  Filled 2020-01-07: qty 5

## 2020-01-07 MED ORDER — LORAZEPAM 2 MG/ML IJ SOLN: 2.0000 mg | INTRAMUSCULAR | Status: DC | PRN

## 2020-01-07 MED ORDER — GLYCOPYRROLATE 0.2 MG/ML IJ SOLN: 0.2000 mg | INTRAMUSCULAR | Status: DC | PRN

## 2020-01-07 MED ORDER — MORPHINE 100MG IN NS 100ML (1MG/ML) PREMIX INFUSION
0.0000 mg/h | INTRAVENOUS | Status: DC
  Administered 2020-01-07: 5 mg/h via INTRAVENOUS
  Filled 2020-01-07: qty 100

## 2020-01-07 MED ORDER — GLYCOPYRROLATE 1 MG PO TABS: 1.0000 mg | ORAL_TABLET | ORAL | Status: DC | PRN

## 2020-01-08 LAB — BLOOD CULTURE ID PANEL (REFLEXED)

## 2020-01-10 LAB — CULTURE, BLOOD (ROUTINE X 2)
Special Requests: ADEQUATE
Special Requests: ADEQUATE

## 2020-02-06 NOTE — Progress Notes (Signed)
The chaplain responded to consult for end-of-life. The chaplain spoke with the nurse and family had not yet arrived. The chaplain will follow-up as needed and when family arrives.  Lavone Neri Chaplain Resident For questions concerning this note please contact me by pager [phone]

## 2020-02-06 NOTE — Progress Notes (Signed)
Occupational Therapy Treatment Patient Details Name: [name] MRN: [id number] DOB: 1955/11/22 Today's Date: 01/17/2020    History of present illness pt is a 64 y/o male with pmhx significant for CAD, DM, CABG, ACDF admitted 4/2 with COVID PNA and delirium, intubated 4/5, proned and paralyzed 4/5-4/9, sedated until 4/12, Bronchoscopy for tube change 4/16, 4/19 trached.  5/16 off sedation and on TC.  01/03/20 trach capped and pt on 2 L O2 [place].   OT comments  Patient supine in bed on arrival.  Patient awake at first though not responding to commands.  He became increasingly fatigued throughout session.  Required total assist x2 to get to EOB and mod/max assist to maintain seated balance. In seated his head was fully flexed forward breathing very labored.  Requiring total assist with ADLs at this time.  Will continue to follow with OT acutely to address the deficits listed below.    Follow Up Recommendations  SNF;Supervision/Assistance - 24 hour    Equipment Recommendations  Other (comment)(tbd at next venue)    Recommendations for Other Services      Precautions / Restrictions Precautions Precautions: Fall Precaution Comments: large sacral decubitus, peg tube Restrictions Weight Bearing Restrictions: No       Mobility Bed Mobility Overal bed mobility: Needs Assistance Bed Mobility: Supine to Sit;Sit to Supine     Supine to sit: +2 for physical assistance;Total assist;HOB elevated Sit to supine: +2 for physical assistance;Total assist   General bed mobility comments: Assist for all aspects.   Transfers                 General transfer comment: unable    Balance Overall balance assessment: Needs assistance Sitting-balance support: Feet supported;Bilateral upper extremity supported Sitting balance-Leahy Scale: Zero Sitting balance - Comments: Pt sat EOB x 7-8 minutes with mod to max assist. No righting reactions. No grimacing or signs of pain. Pt with head flexed  forward and incr work of breathing. Total assist to hold head upright with improved breathing.                                    ADL either performed or assessed with clinical judgement   ADL Overall ADL's : Needs assistance/impaired     Grooming: Wash/dry hands;Wash/dry face;Oral care;Sitting;Total assistance               Lower Body Dressing: Total assistance               Functional mobility during ADLs: Total assistance;+2 for physical assistance       Vision       Perception     Praxis      Cognition Arousal/Alertness: Awake/alert Behavior During Therapy: Anxious;Flat affect Overall Cognitive Status: Difficult to assess                         Following Commands: Follows one step commands inconsistently       General Comments: Pt not verbalizing or following any commands. No purposeful movement and did not respond to name. Initially awake but as fatigued eyes closed.         Exercises     Shoulder Instructions       General Comments High RR especially when sitting with head/neck flexed. 4L O2 [place] SpO2 >90% in sitting. SpO2 88-91% in supine. Positioned pt with towel roll on rt side  of head to promote midline of head.     Pertinent Vitals/ Pain       Pain Assessment: Faces Faces Pain Scale: No hurt  Home Living                                          Prior Functioning/Environment              Frequency  Min 2X/week        Progress Toward Goals  OT Goals(current goals can now be found in the care plan section)  Progress towards OT goals: Not progressing toward goals - comment(Patient non responsive today, not participating)  Acute Rehab OT Goals Patient Stated Goal: pt unable OT Goal Formulation: Patient unable to participate in goal setting Time For Goal Achievement: 01/10/20 Potential to Achieve Goals: Fair  Plan Discharge plan remains appropriate    Co-evaluation    PT/OT/SLP  Co-Evaluation/Treatment: Yes Reason for Co-Treatment: Complexity of the patient's impairments (multi-system involvement);For patient/therapist safety;To address functional/ADL transfers PT goals addressed during session: Mobility/safety with mobility;Balance OT goals addressed during session: ADL's and self-care;Strengthening/ROM      AM-PAC OT "6 Clicks" Daily Activity     Outcome Measure   Help from another person eating meals?: Total Help from another person taking care of personal grooming?: Total Help from another person toileting, which includes using toliet, bedpan, or urinal?: Total Help from another person bathing (including washing, rinsing, drying)?: Total Help from another person to put on and taking off regular upper body clothing?: Total Help from another person to put on and taking off regular lower body clothing?: Total 6 Click Score: 6    End of Session Equipment Utilized During Treatment: Oxygen  OT Visit Diagnosis: Other abnormalities of gait and mobility (R26.89);Muscle weakness (generalized) (M62.81);Pain;Other symptoms and signs involving cognitive function;Cognitive communication deficit (R41.841)   Activity Tolerance Patient limited by lethargy   Patient Left in bed;with call bell/phone within reach;with bed alarm set   Nurse Communication Mobility status        Time: 8841-6606 OT Time Calculation (min): 25 min  Charges: OT General Charges $OT Visit: 1 Visit OT Treatments $Therapeutic Activity: 8-22 mins  Barbie Banner, OTR/L   Adella Hare 01-23-20, 1:35 PM

## 2020-02-06 NOTE — Progress Notes (Signed)
NAME:  [name], MRN:  [id number], DOB:  10-02-1955, LOS: 33 ADMISSION DATE:  12/05/2019, CONSULTATION DATE:  4/5 REFERRING MD:  Sloan Leiter, CHIEF COMPLAINT:  Dyspnea   Brief History   64 year old man admitted 4/2 with COVID pneumonia and delirium after 1 week of symptoms.  Required intubation on 4/5, required prolonged mechanical ventilatory support; hospitalization complicated by pneumomediastinum and pneumopericardium.  Tracheostomy performed on 4/20.  He was liberated from the ventilator 5/20.  5/17 pending LTAC placement- -this is delayed due to leukocytosis, fevers and some difficult vent weaning and acute delirium requiring precedex which was weaned off 5/23.  Transferred to medical floor with [facility] as primary on 5/25.  Tolerated capping trials on 5/27 and patient was decannulated on 5/29.  Spiked a fever of 101.7 overnight 5/31 with down trending WBC 21.1-> 16.8.  Pan-cultured.  Went for MBS and noted for significant oral and pharyngeal dysphagia and silent aspiration with decreased alertness, becoming more dyspneic requiring 2L [place] with sats 91-94%.  He remained NPO.  CXR obtained showed persistent left lower airspace process, resolved right lower airspace process and small residual right pleural effusion.  PCCM re-consulted for continued respiratory distress and decreased mental status secondary to acute aspiration.   Past Medical History  Laceration of left brachial artery  Hypercholesteremia HX of heart bypass GERD Type 2 diabetes CAD Asthma   Significant Hospital Events   4/2 admitted  4/5 intubated 4/19: tachypneic overnight and this am. Unable to wean sedation. Tracheostomy done  4/20: successful trach yesterday but de-recruited and on some escalated settings today. Remains tachypneic this am. Hypoglycemic yesterday and lantus changed 4/22: started on empiric abx but resp cx with abundant gpc. Improving oxygenation requirement but remains with rass -4 despite light sedation.   4/23:mucous plugging overnight and this am with copious/thick secretions.  4/28: issues overnight with increased vent settings. Still without purposeful responses. Fever overnight as well to 101.1. remains minimally responsive and unable to wean iv sedation except for short periods, despite increase in po agents.  4/29: somewhat improved settings overnight on 60/8. Still with temps overnight to 100.7 5/1: Continued improvement in vent requirement 50/8, remains on several sedating medications including continuous fentanyl and precedex. Trach changed to 6.0 shiley XLT. 5/7 tolerating pressure support ventilation while on sedation 5/16 off sedative infusions and on to trach collar. Tolerated x 3 hours 5/17 CPAP overnight. PRN APAP for fevers.  5/18 No issues overnight, placed on ATC this AM and tolerating well; PEG placed 5/20 Remained on ATC overnight, mild agitation noted overnight with addition of low dose precedex drip this resolved  5/23 Precedex weaned off 5/25 [facility] primary, PCCM following for trach care 5/27 tracheostomy tube capped: 5/29 decannulated 6/1  PCCM reconsulted for aspiration/ respiratory distress after failed MBS ->family elected to shift to comfort care  Consults:    Procedures:  04/05 Rt CVC > 4/24 04/16 Bronchoscopy  04/20Tracheostomy>> 5/29 4/24 Rt PICC 5/18 PEG  Significant Diagnostic Tests:  4/22 CXR: 1. Lines and tubes stable position. 2. Persistent bibasilar pulmonary infiltrates/edema with interim slight clearing from prior exam. 3. Prior CABG. Heart size stable.  4/24 CXR: Slight increase in interstitial and airspace opacities particularly in the lower lobes. Findings suspicious for worsening asymmetric edema or infection.  Head CT 4/27 Mild chronic ischemic white matter disease. No acute intracranial abnormality seen.  Abdominal CT 4/27 > for PEG, trace pneumoperitoneum, extensive airspace disease due to COVID 19, kidney stone left,  non-obstructing  MRI  brain 5/3 > NAICP, chronic small vessel ischemia, chronic microhemorrhages, stable mild generalized atrophy, ethmoid sinus thickening, bilateral mastoid effusions  Chest CT 5/11 > 1. Pulmonary fibrosis with UIP pattern, new from 2015. 2. There is lower lobe atelectasis. No edema or convincing Pneumonia.  ECHO 5/20> LVEF 50% Mildly decreased LV function, without wall motion abnormalities. Grade I diastolic dysfunction. RV systolic function normal, normal pulmonary artery systolic pressure. RVSP 28.2 mmHgf  6/1 MBS >> Pt exhibited signifiant oral and pharyngeal dysphagia with penetration and silent aspiration.   Micro Data:  COVID 4/2 >Positive  Blood cultures 4/2 > negative  Respiratory culture 4/5 > Negative  Respiratory culture 4/12 > Normal flora  Respiratory culture 4/21 > Normal flora  BCx 5/15> neg Tracheal aspirate 5/14> Rare GPR-- consistent with normal respiratory flora  Sacral wound cx 5/15> pseudomonas aeruginosa  Trach asp 5/18 >> normal flora MRSA PCR 5/30 >> neg BCx2 6/1 >>  Antimicrobials:  4/21 Vancomycin > 4/23 4/21 Zosyn > 4/26, 5/19-5/27  Interim history/subjective:   Objective   Blood pressure 121/77, pulse (!) 123, temperature 99.9 F (37.7 C), temperature source Axillary, resp. rate (!) 32, height 5\' 8"  (1.727 m), weight 74.8 kg, SpO2 (!) 85 %.        Intake/Output Summary (Last 24 hours) at 02/05/2020 1615 Last data filed at 01/20/2020 1240 Gross per 24 hour  Intake 0 ml  Output 1275 ml  Net -1275 ml   Filed Weights   01/05/20 0321 01/06/20 0334 01/18/2020 0439  Weight: 74.2 kg 76.6 kg 74.8 kg    Examination:  General:  Increased work of breathing in bed HENT: NCAT OP clear PULM: rhonchi bilaterally, normal effort CV: RRR, no mgr GI: BS+, soft, nontender MSK: normal bulk and tone Neuro: opens eyes to voice, doesn't speak or follow commands  Resolved Hospital Problem list   COVID-19 pneumonia -Has completed full course  of remdesivir and dexamethasone, off isolation 4/23.  HCAP Pneumomediastinum/pneumopericardium Urinary retention VDRF  Assessment & Plan:   Acute respiratory failure with hypoxia in the setting of acute aspiration pneumonitis/ pneumonia complicated by severe deconditioning Post Covid pulmonary fibrosis SIRS Hypernatremia  Toxic metabolic encephalopathy  Infected sacral pressure ulcer, unstageable Dysphagia  DM, type 2  Discussion: Samvel has been through a lot over the last several months and unfortunately he now has severe acute respiratory failure with hypoxemia, acute encephalopathy secondary to anoxia from the same.  He has severe increased work of breathing and will develop full respiratory failure and respiratory arrest if we do not make an intervention this afternoon.  We made several attempts to call his wife and we finally reached her.  I explained to her that it is very unlikely he will ever come off of a ventilator should we intervene with mechanical ventilation.  She said that she could tell yesterday he was worse and something was wrong.  After discussion she agrees that he should be comfort care and not go back on a ventilator.  Plan: CODE STATUS DNR Morphine drip for comfort Comfort measures only Remain on 5 W. Would liberate family visitation guidelines   Best practice:   Per [facility]   Critical care time: 40 minutes    Rayna Sexton, MD [facility] Pager: [phone] Cell: [phone] If no response, call [phone]

## 2020-02-06 NOTE — Progress Notes (Signed)
Modified Barium Swallow Progress Note  Patient Details  Name: [name] MRN: [id number] Date of Birth: 04/17/1956  Today's Date: 01-09-2020  Modified Barium Swallow completed.  Full report located under Chart Review in the Imaging Section.  Brief recommendations include the following:  Clinical Impression  Pt exhibited signifiant oral and pharyngeal dysphagia with penetration and silent aspiration. He was awake however alertness decreased, dyspneic (appeared to be anxious) and tachycardic, 02 sats 91-94%. Majority of the time he did not respond to therapists commands but did accept puree, nectar and honey thick barium. His lingual propulsion and cohesion were poor and prolonged with additional time and cues needed to propel. Impairments in both timing and ROM observed with poor-fair anterior laryngeal movement with decreased coordination. Late epiglottic deflection leading to penetration and silent aspiration of puree and nectar thick and penetration of honey. He was unable to produce throat clear/cough with manual pressure on stoma (decannulated 3 days ago). Vallecular and pyriform sinus residue ranged from mild-moderate. Continue NPO status, oral care and ST.             Swallow Evaluation Recommendations       SLP Diet Recommendations: NPO       Medication Administration: Via alternative means               Oral Care Recommendations: Oral care QID        [name] 01-09-20,11:04 AM   [name].Ed Nurse, children's [phone] Office [phone]

## 2020-02-06 NOTE — Significant Event (Addendum)
Rapid Response Event Note  Overview: Time Called: 1600 Arrival Time: 1603 Event Type: Respiratory Increase work of breathing, increase supplemental oxygen requirement.   Initial Focused Assessment: Pt lying in bed. PERRLA, 28mm. Pt appears lethargic, pale. Lung sounds are rhonchus. Accessory muscle use noted. Pt has weak cough and weak gag reflex.  Interventions: No intervention from RR RN.  New orders received from Dr. Catha Gosselin- MD at bedside to evaluate pt.   Plan of Care (if not transferred): Pt transitioned to comfort care.  Event Summary: Name of Physician Notified: Dr. Catha Gosselin at 1600 Outcome: Code status clarified Event End Time: 1610(RR RN called away to another emergency.)  Jennye Moccasin

## 2020-02-06 NOTE — Progress Notes (Signed)
Pharmacy Antibiotic Note  [name] is a 64 y.o. male admitted on 11-20-19 with aspiration pneumonia.  Pharmacy has been consulted for Unasyn dosing.  Patient has been off antibiotics since 01/03/20. WBC is elevated at 16.8 but down from prior value of 21.1 back on 01/01/20. SCr 0.72 with estimated CrCl ~ 89 mL/min. Tmax 101.7 for last 24 hours.   Plan: Unasyn 3g IV every 6 hours Monitor renal function, culture results, and clinical status.  Height: 5\' 8"  (172.7 cm) Weight: 74.8 kg (164 lb 14.5 oz) IBW/kg (Calculated) : 68.4  Temp (24hrs), Avg:99.9 F (37.7 C), Min:99 F (37.2 C), Max:101.7 F (38.7 C)  Recent Labs  Lab 01/01/20 0430 01/02/20 0451 01/03/20 1048 01/04/20 0500 01/05/20 0635 01/06/20 0406 01/11/2020 0300 01/12/2020 0709  WBC 21.1*  --   --   --   --   --   --  16.8*  CREATININE 0.56*   < > 0.66 0.47* 0.45* 0.45* 0.72  --    < > = values in this interval not displayed.    Estimated Creatinine Clearance: 89.1 mL/min (by C-G formula based on SCr of 0.72 mg/dL).    Allergies  Allergen Reactions  . Niacin And Related Other (See Comments)    Reaction unknown    Antimicrobials this admission: Meropenem 5/15 >>5/16 Vanc 4/21>>4/23; 5/15 >>5/16 Zosyn 4/21>>4/27; 5/18>>5/28 Azithro 4/3 >> 4/5 CTX 4/2 >> 4/7 Remdesivir 4/2 >> 4/6 Actemra 4/2 x 1 Unasyn 5/17x1 Doxy 5/17x1  Dose adjustments this admission:  Microbiology results: 4/2 BCx - negative 4/2 covid - positive 4/5 TA - negative 4/12 TA - negative 4/21 TA - negative 4/22 MRSA PCR - negative 5/14 BCx - ngtd 5/14 TA - rare GPR 5/15 BCx - ngtd 5/15 sacral wound - PSA 5/18 TA - negative  Thank you for allowing pharmacy to be a part of this patient's care.  6/18 01/25/2020 4:14 PM

## 2020-02-06 NOTE — Progress Notes (Signed)
Pt with increased WOB and a drop in o2 sats. Pts o2 increased to 6L via [place]. Pt tachypneic with nasal flaring present. RT, MD and rapid response RN called to assess. Pt suctioned by RT and placed on NRB. MD to call PCCM to assess and awaiting new orders at this time.      01-29-2020 1600  Vitals  Temp 99.9 F (37.7 C)  Temp Source Axillary  BP 121/77  MAP (mmHg) 91  BP Location Right Arm  BP Method Automatic  Patient Position (if appropriate) Lying  Pulse Rate (!) 123  Pulse Rate Source Monitor  ECG Heart Rate (!) 120  Cardiac Rhythm ST  Resp (!) 32  Oxygen Therapy  SpO2 (!) 85 %  O2 Device Nasal Cannula  O2 Flow Rate (L/min) 6 L/min  MEWS Score  MEWS Temp 0  MEWS Systolic 0  MEWS Pulse 2  MEWS RR 2  MEWS LOC 0  MEWS Score 4  MEWS Score Color Red

## 2020-02-06 NOTE — Progress Notes (Signed)
Physical Therapy Treatment Patient Details Name: [name] MRN: [id number] DOB: 23-Oct-1955 Today's Date: 01/27/2020    History of Present Illness pt is a 64 y/o male with pmhx significant for CAD, DM, CABG, ACDF admitted 4/2 with COVID PNA and delirium, intubated 4/5, proned and paralyzed 4/5-4/9, sedated until 4/12, Bronchoscopy for tube change 4/16, 4/19 trached.  5/16 off sedation and on TC.  01/03/20 trach capped and pt on 2 L O2 [place].    PT Comments    Pt fatigued and dyspneic. Limited interaction during therapy.    Follow Up Recommendations  SNF     Equipment Recommendations  3in1 (PT);Wheelchair (measurements PT);Wheelchair cushion (measurements PT);Hospital bed;Other (comment)(oxygen)    Recommendations for Other Services       Precautions / Restrictions Precautions Precautions: Fall Precaution Comments: large sacral decubitus, peg tube    Mobility  Bed Mobility Overal bed mobility: Needs Assistance Bed Mobility: Supine to Sit;Sit to Supine     Supine to sit: +2 for physical assistance;Total assist;HOB elevated Sit to supine: +2 for physical assistance;Total assist   General bed mobility comments: Assist for all aspects.   Transfers                    Ambulation/Gait                 Stairs             Wheelchair Mobility    Modified Rankin (Stroke Patients Only)       Balance Overall balance assessment: Needs assistance Sitting-balance support: Feet supported;Bilateral upper extremity supported Sitting balance-Leahy Scale: Zero Sitting balance - Comments: Pt sat EOB x 7-8 minutes with mod to max assist. No righting reactions. No grimacing or signs of pain. Pt with head flexed forward and incr work of breathing. Total assist to hold head upright with improved breathing.                                     Cognition Arousal/Alertness: Awake/alert Behavior During Therapy: Anxious;Flat affect Overall Cognitive  Status: Difficult to assess                                 General Comments: Pt not verbalizing or following any commands. No purposeful movement and did not respond to name. Initially awake but as fatigued eyes closed.       Exercises      General Comments General comments (skin integrity, edema, etc.): High RR especially when sitting with head/neck flexed. SpO2 >90% in sitting. SpO2 88-91% in supine. Positioned pt with towel roll on rt side of head to promote midline of head.       Pertinent Vitals/Pain Pain Assessment: Faces Faces Pain Scale: No hurt Pain Intervention(s): Monitored during session    Home Living                      Prior Function            PT Goals (current goals can now be found in the care plan section) Progress towards PT goals: Not progressing toward goals - comment    Frequency    Min 2X/week      PT Plan Current plan remains appropriate;Frequency needs to be updated    Co-evaluation PT/OT/SLP Co-Evaluation/Treatment: Yes Reason for Co-Treatment: Complexity of  the patient's impairments (multi-system involvement);For patient/therapist safety PT goals addressed during session: Mobility/safety with mobility;Balance        AM-PAC PT "6 Clicks" Mobility   Outcome Measure  Help needed turning from your back to your side while in a flat bed without using bedrails?: Total Help needed moving from lying on your back to sitting on the side of a flat bed without using bedrails?: Total Help needed moving to and from a bed to a chair (including a wheelchair)?: Total Help needed standing up from a chair using your arms (e.g., wheelchair or bedside chair)?: Total Help needed to walk in hospital room?: Total Help needed climbing 3-5 steps with a railing? : Total 6 Click Score: 6    End of Session Equipment Utilized During Treatment: Oxygen Activity Tolerance: Patient limited by fatigue Patient left: in bed;with call  bell/phone within reach Nurse Communication: Mobility status PT Visit Diagnosis: Other abnormalities of gait and mobility (R26.89);Muscle weakness (generalized) (M62.81);Difficulty in walking, not elsewhere classified (R26.2)     Time: 3903-0092 PT Time Calculation (min) (ACUTE ONLY): 25 min  Charges:  $Therapeutic Activity: 8-22 mins                     [place] Pager [phone] Office [place] 01/20/2020, 12:19 PM

## 2020-02-06 NOTE — Death Summary Note (Signed)
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 The interatrial septum was not well visualized.  LEFT VENTRICLE PLAX 2D LVIDd:         5.10 cm  Diastology LVIDs:         3.50 cm  LV e' lateral:   10.60 cm/s LV PW:         0.90 cm  LV E/e' lateral: 6.7 LV IVS:        0.70 cm  LV e' medial:    7.29 cm/s LVOT diam:     2.20 cm  LV E/e' medial:  9.7 LV SV:         94 LV SV Index:   48 LVOT Area:     3.80 cm  RIGHT VENTRICLE RV S prime:     8.70 cm/s TAPSE (M-mode): 1.2 cm LEFT ATRIUM             Index       RIGHT ATRIUM          Index LA diam:        3.70 cm 1.91 cm/m  RA Area:     9.34 cm LA Vol (A2C):   45.3 ml 23.33 ml/m RA Volume:   16.90 ml 8.70 ml/m LA Vol (A4C):   35.4 ml 18.23 ml/m LA Biplane Vol: 40.7 ml 20.96 ml/m  AORTIC VALVE LVOT Vmax:   117.00 cm/s LVOT Vmean:  87.000 cm/s LVOT VTI:    0.247 m  AORTA Ao Root diam: 3.60 cm MITRAL VALVE               TRICUSPID VALVE MV Area (PHT): 3.53 cm    TR Peak grad:   20.2 mmHg MV Decel Time: 215 msec    TR Vmax:        225.00 cm/s MV E velocity: 70.70 cm/s MV A velocity: 77.10 cm/s  SHUNTS MV E/A ratio:  0.92        Systemic VTI:  0.25 m                            Systemic Diam: 2.20 cm Weston Brass MD Electronically signed by Weston Brass MD Signature  Date/Time: 12/27/2019/12:05:36 AM    Final     Microbiology Recent Results (from the past 240 hour(s))  MRSA PCR Screening     Status: None   Collection Time: 01/05/20 10:32 AM   Specimen: Nasopharyngeal  Result Value Ref Range Status   MRSA by PCR NEGATIVE NEGATIVE Final    Comment:        The GeneXpert MRSA Assay (FDA approved for NASAL specimens only), is one component of a comprehensive MRSA colonization surveillance program. It is not intended to diagnose MRSA infection nor to guide or monitor treatment for MRSA infections. Performed at [facility], [address] [place], [place] [address]   Culture, blood (routine x 2)     Status: Abnormal (Preliminary result)   Collection Time: 01/20/2020  1:52 PM  Specimen: BLOOD  Result Value Ref Range Status   Specimen Description BLOOD LEFT ANTECUBITAL  Final   Special Requests   Final    BOTTLES DRAWN AEROBIC AND ANAEROBIC Blood Culture adequate volume   Culture  Setup Time   Final    GRAM POSITIVE COCCI IN CLUSTERS AEROBIC BOTTLE ONLY Organism ID to follow PATIENT DISCHARGED OR EXPIRED PATIENT DECEASED    Culture (A)  Final    STAPHYLOCOCCUS AUREUS SUSCEPTIBILITIES TO FOLLOW Performed at [facility], [address], [place], [place] [address]    Report Status PENDING  Incomplete  Blood Culture ID Panel (Reflexed)     Status: Abnormal   Collection Time: Jan 25, 2020  1:52 PM  Result Value Ref Range Status   Enterococcus species NOT DETECTED NOT DETECTED Final   Listeria monocytogenes NOT DETECTED NOT DETECTED Final   Staphylococcus species DETECTED (A) NOT DETECTED Final   Staphylococcus aureus (BCID) DETECTED (A) NOT DETECTED Final    Comment: Methicillin (oxacillin)-resistant Staphylococcus aureus (MRSA). MRSA is predictably resistant to beta-lactam antibiotics (except ceftaroline). Preferred therapy is vancomycin unless clinically contraindicated. Patient requires contact precautions if  hospitalized.     Methicillin resistance DETECTED (A) NOT DETECTED Final    Comment: PATIENT DISCHARGED OR EXPIRED   Streptococcus species NOT DETECTED NOT DETECTED Final   Streptococcus agalactiae NOT DETECTED NOT DETECTED Final   Streptococcus pneumoniae NOT DETECTED NOT DETECTED Final   Streptococcus pyogenes NOT DETECTED NOT DETECTED Final   Acinetobacter baumannii NOT DETECTED NOT DETECTED Final   Enterobacteriaceae species NOT DETECTED NOT DETECTED Final   Enterobacter cloacae complex NOT DETECTED NOT DETECTED Final   Escherichia coli NOT DETECTED NOT DETECTED Final   Klebsiella oxytoca NOT DETECTED NOT DETECTED Final   Klebsiella pneumoniae NOT DETECTED NOT DETECTED Final   Proteus species NOT DETECTED NOT DETECTED Final   Serratia marcescens NOT DETECTED NOT DETECTED Final   Haemophilus influenzae NOT DETECTED NOT DETECTED Final   Neisseria meningitidis NOT DETECTED NOT DETECTED Final   Pseudomonas aeruginosa NOT DETECTED NOT DETECTED Final   Candida albicans NOT DETECTED NOT DETECTED Final   Candida glabrata NOT DETECTED NOT DETECTED Final   Candida krusei NOT DETECTED NOT DETECTED Final   Candida parapsilosis NOT DETECTED NOT DETECTED Final   Candida tropicalis NOT DETECTED NOT DETECTED Final    Comment: Performed at [facility], [address], [place], [place] [address]  Culture, blood (routine x 2)     Status: None (Preliminary result)   Collection Time: 2020/01/25  1:57 PM   Specimen: BLOOD LEFT HAND  Result Value Ref Range Status   Specimen Description BLOOD LEFT HAND  Final   Special Requests   Final    BOTTLES DRAWN AEROBIC AND ANAEROBIC Blood Culture adequate volume   Culture  Setup Time   Final    GRAM POSITIVE COCCI IN CLUSTERS AEROBIC BOTTLE ONLY CRITICAL VALUE NOTED.  VALUE IS CONSISTENT WITH PREVIOUSLY REPORTED AND CALLED VALUE.    Culture   Final    NO GROWTH 2 DAYS Performed at [facility], [address] [place], [place] [address]    Report Status PENDING   Incomplete    Lab Basic Metabolic Panel: Recent Labs  Lab 01/03/20 1048 01/04/20 0500 01/05/20 0635 01/06/20 0406 01-25-20 0300  NA 143 144 142 147* 149*  K 3.5 3.2* 3.5 3.9 4.6  CL 96* 99 105 108 108  CO2 34* 34* GLUCOSE 263* 234* 226* 175*  225*  BUN 51* 44* 37* 37* 49*  CREATININE 0.66 0.47* 0.45* 0.45* 0.72  CALCIUM 10.2 10.0 9.8 10.1 10.0  MG 2.0 2.1 2.1 2.4  --    Liver Function Tests: No results for input(s): AST, ALT, ALKPHOS, BILITOT, PROT, ALBUMIN in the last 168 hours. No results for input(s): LIPASE, AMYLASE in the last 168 hours. No results for input(s): AMMONIA in the last 168 hours. CBC: Recent Labs  Lab 01/28/2020 0709  WBC 16.8*  HGB 12.1*  HCT 41.9  MCV 92.7  PLT 484*   Cardiac Enzymes: No results for input(s): CKTOTAL, CKMB, CKMBINDEX, TROPONINI in the last 168 hours. Sepsis Labs: Recent Labs  Lab 01/23/2020 0709  WBC 16.8*    Procedures/Operations  Procedures  04/05 Rt CVC > 4/24 04/16 Bronchoscopy  04/20Tracheostomy 4/24 Rt PICC 5/20 Echocardiogram  Significant events 4/19: tachypneic overnight and this am. Unable to wean sedation. Tracheostomy done  4/20:successful trach yesterday but de-recruited and on some escalated settings today. Remains tachypneic this am. Hypoglycemic yesterday and lantus changed 4/22:started on empiric abx but resp cx with abundant gpc. Improving oxygenation requirement but remains with rass -4 despite light sedation.  4/23:mucous plugging overnight and this am with copious/thick secretions.  4/28: issues overnight with increased vent settings. Still without purposeful responses. Fever overnight as well to 101.1. remains minimally responsive and unable to wean iv sedation except for short periods, despite increase in po agents.  4/29: somewhat improved settings overnight on 60/8. Still with temps overnight to 100.7 5/1: Continued improvement in vent requirement 50/8, remains on several sedating  medications including continuous fentanyl and precedex. Trach changed to 6.0 shiley XLT. 5/7 tolerating pressure support ventilation while on sedation 5/16off sedative infusions and on to trach collar. Tolerated x 3 hours 5/17CPAP overnight. PRN APAP for fevers.  5/18 No issues overnight, placed on ATC this AM and tolerating well  5/20 Remained on ATC overnight, mild agitation noted overnight with addition of low dose precedex drip this resolved  5/23 Precedex weaned off 5/29 Decannulated   [facility] 01/09/2020, 2:19 PM

## 2020-02-06 NOTE — Progress Notes (Signed)
Pt on comfort care with family at bedside. Morphine drip initiated and chaplain called for support. Pt passed quickly with time of death at 2 with [facility]. RN as second Training and development officer to pronounce death.

## 2020-02-06 NOTE — TOC Progression Note (Signed)
Transition of Care [facility]) - Progression Note    Patient Details  Name: [name] MRN: [id number] Date of Birth: 1956/03/03  Transition of [facility]) CM/SW Contact  Mearl Latin, LCSW Phone Number: 01/18/2020, 9:17 AM  Clinical Narrative:    Patient currently has no SNF bed offers. CSW following up with potential to expand search.    Expected Discharge Plan: Skilled Nursing Facility Barriers to Discharge: Continued Medical Work up, English as a second language teacher, Engineer, mining)  Expected Discharge Plan and Services Expected Discharge Plan: Skilled Nursing Facility In-house Referral: Clinical Social Work   Post Acute Care Choice: Skilled Nursing Facility Living arrangements for the past 2 months: Single Family Home                                       Social Determinants of Health (SDOH) Interventions    Readmission Risk Interventions Readmission Risk Prevention Plan 01/02/2020  Transportation Screening Complete  Medication Review Oceanographer) Complete  PCP or Specialist appointment within 3-5 days of discharge Complete  HRI or Home Care Consult Complete  SW Recovery Care/Counseling Consult Complete  Palliative Care Screening Complete  Skilled Nursing Facility Complete  Some recent data might be hidden
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
## 2020-02-06 DEATH — deceased

## 2020-12-03 IMAGING — DX DG CHEST 1V
1 series · 1 of 1 positions shown · non-contrast
Comparison: 11/22/2019

CLINICAL DATA: ARDS. [id number] infection.

EXAM:
CHEST  1 VIEW

[chest]
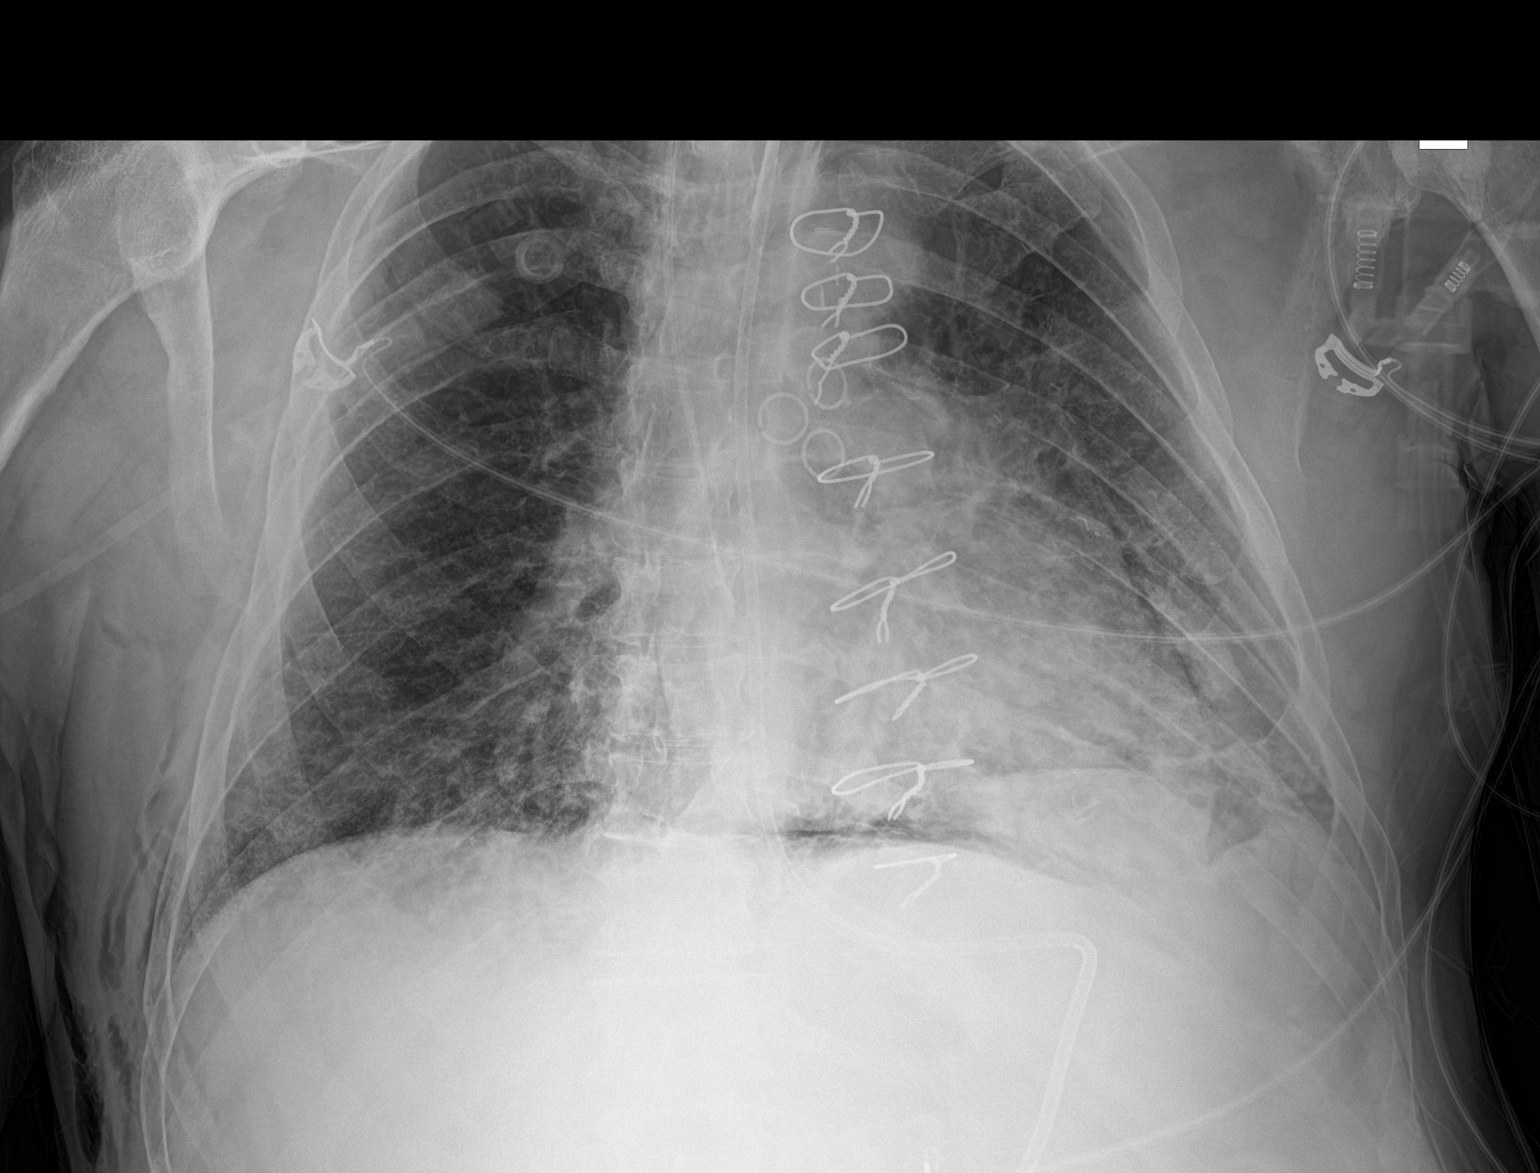

[1 of 1 positions shown; findings below may reference images not displayed]

FINDINGS: Feeding tube extending into the stomach. Right jugular catheter tip
in the superior aspect of the right atrium. Endotracheal tube in
satisfactory position.

The cardiac silhouette remains borderline enlarged. Decreased
mediastinal and pericardial air. Mildly improved patchy opacity in
the left lower lobe. Stable diffuse prominence of the interstitial
markings. No definite pleural fluid seen. Unremarkable bones.
IMPRESSION: 1. Mildly improved left lower lobe pneumonia.
2. Decreased pneumomediastinum/pneumopericardium.
3. Stable chronic interstitial lung disease.

## 2020-12-04 IMAGING — DX DG CHEST 1V PORT
1 series · 1 of 1 positions shown · non-contrast
Comparison: 11/24/2019

CLINICAL DATA: Follow-up tracheostomy

EXAM:
PORTABLE CHEST 1 VIEW

[chest ap]
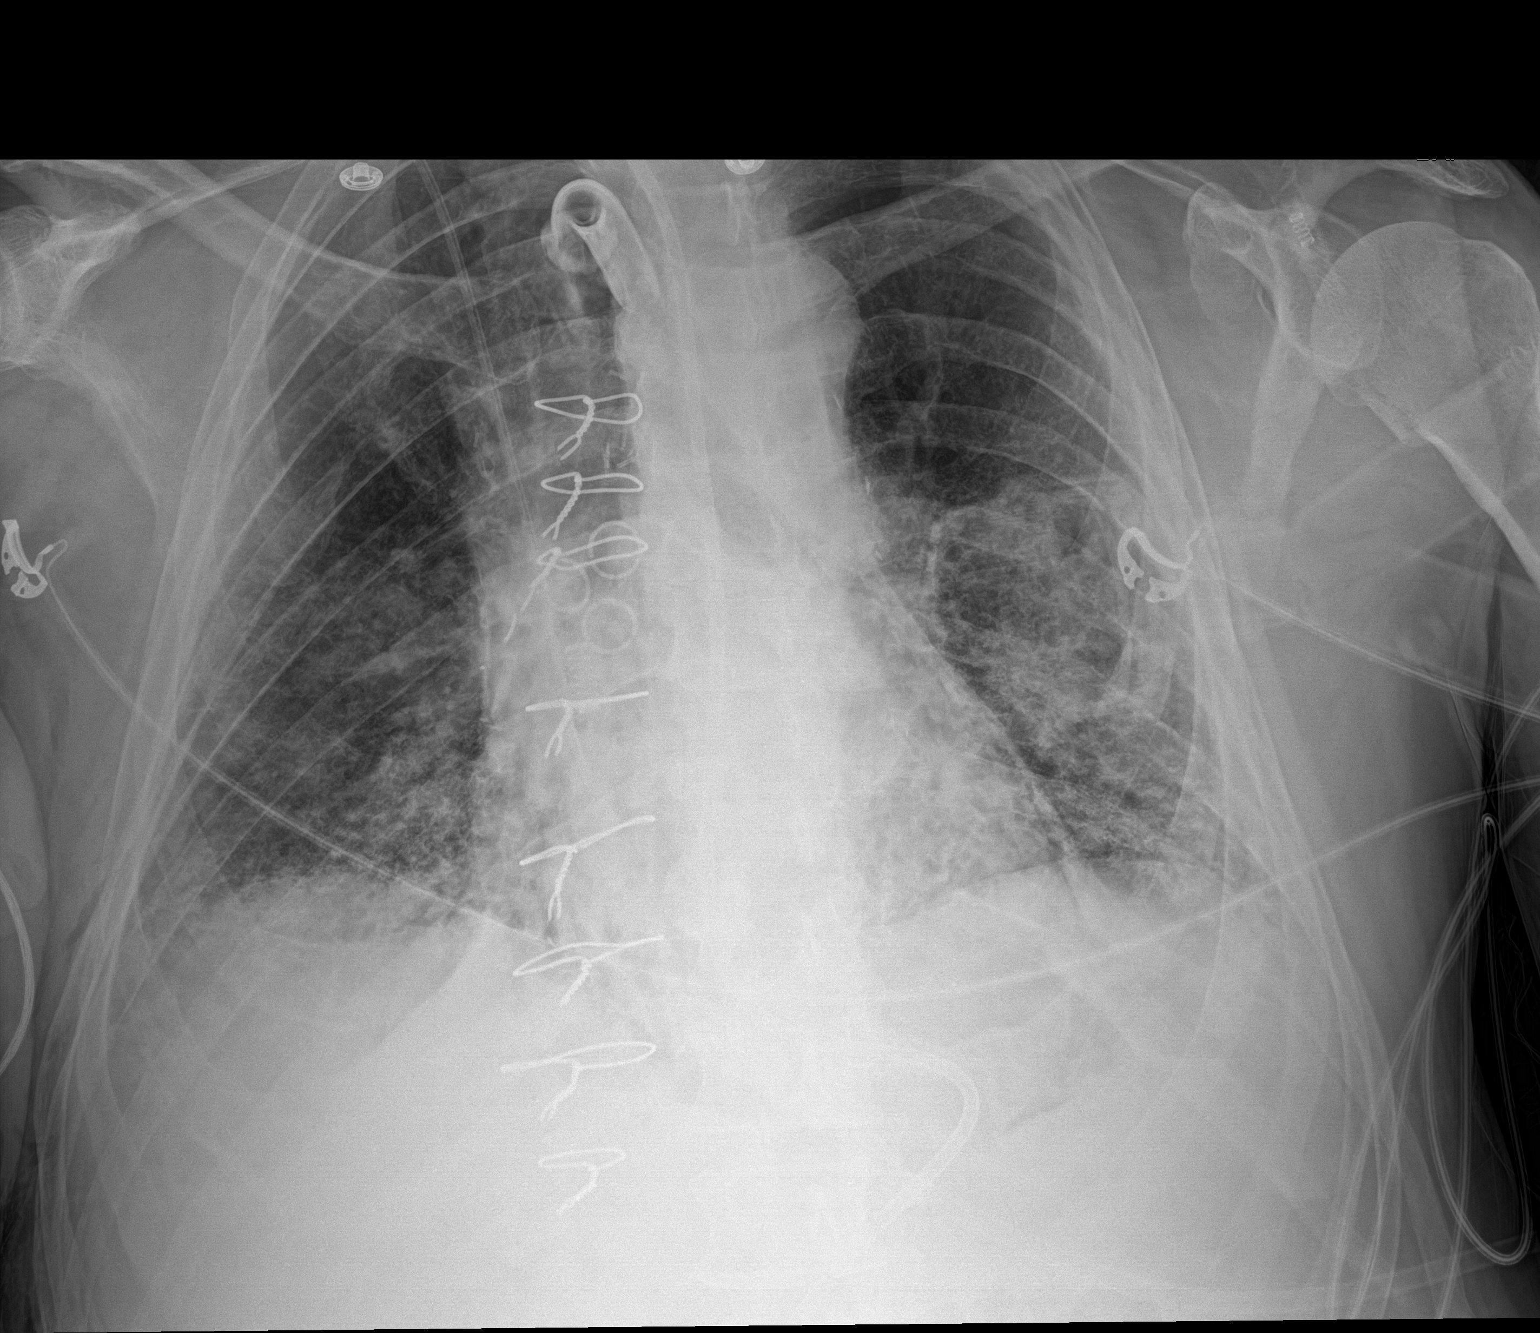

[1 of 1 positions shown; findings below may reference images not displayed]

FINDINGS: Tracheostomy in place. No pneumothorax. Small amount of air and the
right supraclavicular region soft tissues. Soft feeding tube enters
the abdomen. Previous median sternotomy and CABG. Patchy infiltrate
and volume loss persists in the mid and lower lungs bilaterally,
slightly worsened radiographically. Upper lungs remain clear. Right
internal jugular central line tip in the proximal right atrium.
IMPRESSION: Tracheostomy in position.  No evidence of pneumothorax.

Slight worsening of patchy infiltrate in both mid and lower lungs.

## 2020-12-18 IMAGING — MR MR HEAD W/O CM
12 of 13 series · 44 of 48 positions shown · non-contrast
Comparison: Noncontrast head CT [DATE], brain MRI [DATE]

CLINICAL DATA: Post COVID, persistent cephalopathy; additional
history provided: 63-year-old male admitted [REDACTED] with COVID
pneumonia and delirium after 1 week of symptoms, eventually
requiring intubation, continue to require sedation and paralytic
send till [REDACTED] for dyssynchrony.

EXAM:
MRI HEAD WITHOUT CONTRAST
TECHNIQUE: Multiplanar, multiecho pulse sequences of the brain and surrounding
structures were obtained without intravenous contrast.

[Series 5: DWI · axial · 3.0mm · 0.88mm/px · z∈[-98,+42]mm · 8 of 96 slices shown (1 of 4)]
[im 1/96]
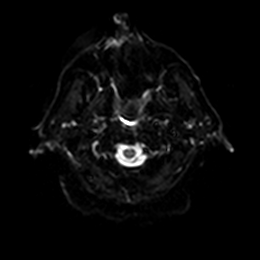
[im 14/96]
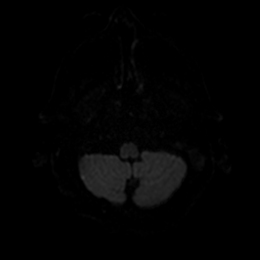
[im 28/96]
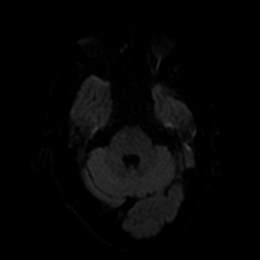
[im 41/96]
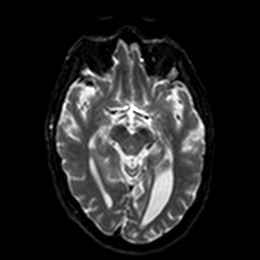
[im 55/96]
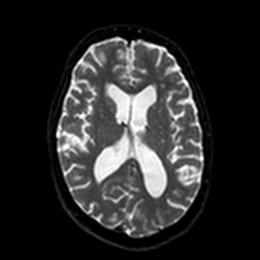
[im 68/96]
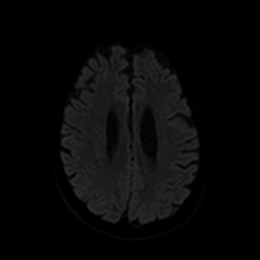
[im 82/96]
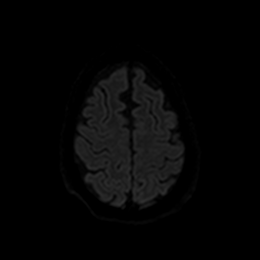
[im 96/96]
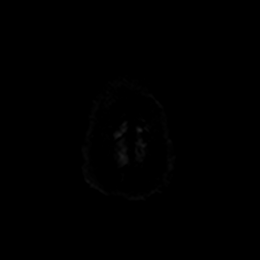

[Series 6: DWI · axial · 3.0mm · 0.88mm/px · z∈[-98,+42]mm · 4 of 48 slices shown (2 of 4)]
[im 1/48]
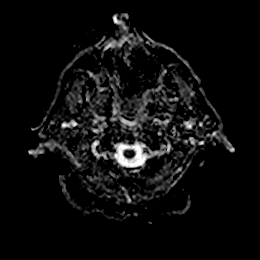
[im 16/48]
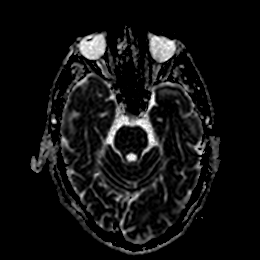
[im 32/48]
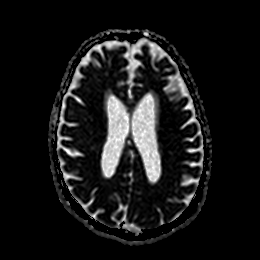
[im 48/48]
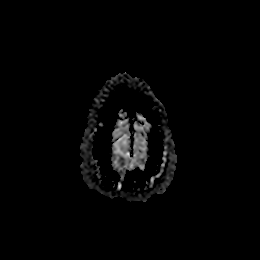

[Series 7: DWI · coronal · 4.0mm · 0.88mm/px · 5 of 74 slices shown (3 of 4)]
[im 1/74]
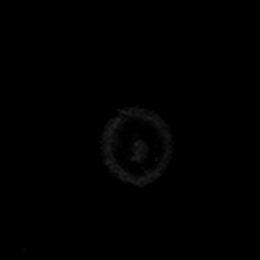
[im 19/74]
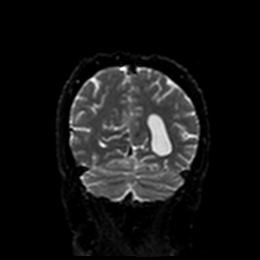
[im 37/74]
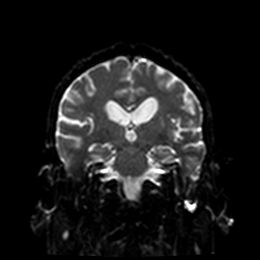
[im 55/74]
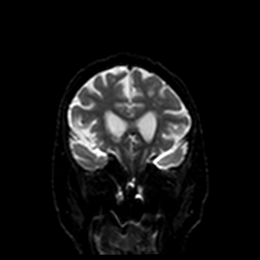
[im 74/74]
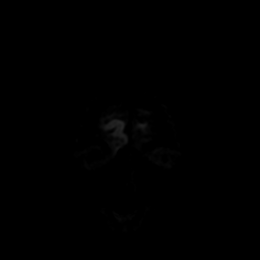

[Series 8: DWI · coronal · 4.0mm · 0.88mm/px · 3 of 37 slices shown (4 of 4)]
[im 1/37]
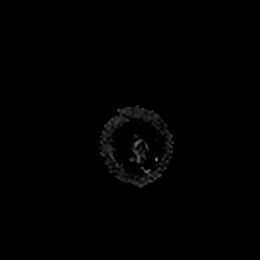
[im 19/37]
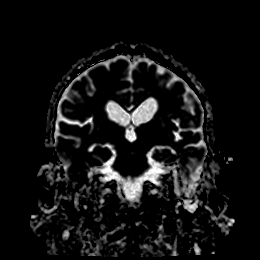
[im 37/37]
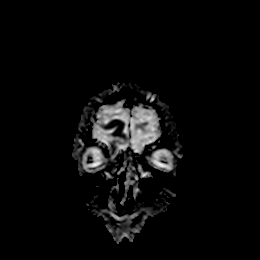

[Series 9: T1 · sagittal · 5.0mm · 0.75mm/px · 2 of 23 slices shown]
[im 1/23]
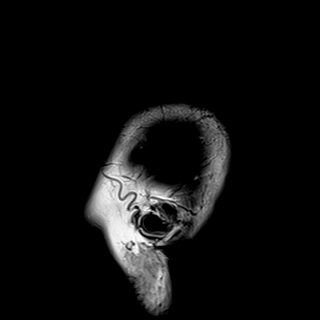
[im 23/23]
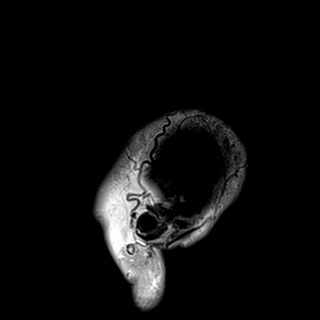

[Series 10: T2 · axial · 5.0mm · 0.72mm/px · z∈[-102,+41]mm · 2 of 25 slices shown (1 of 2)]
[im 1/25]
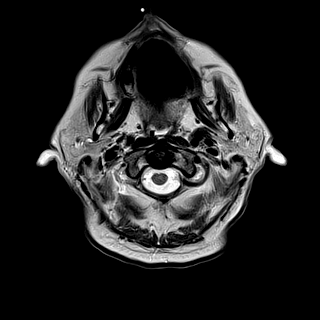
[im 25/25]
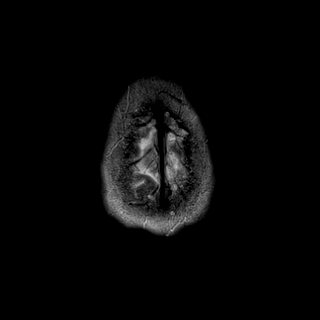

[Series 11: FLAIR · axial · 5.0mm · 0.45mm/px · z∈[-99,+44]mm · 2 of 25 slices shown]
[im 1/25]
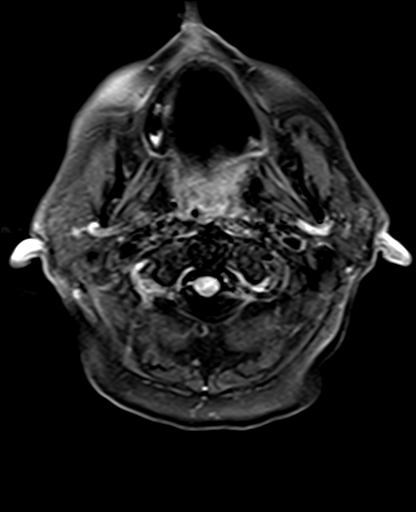
[im 25/25]
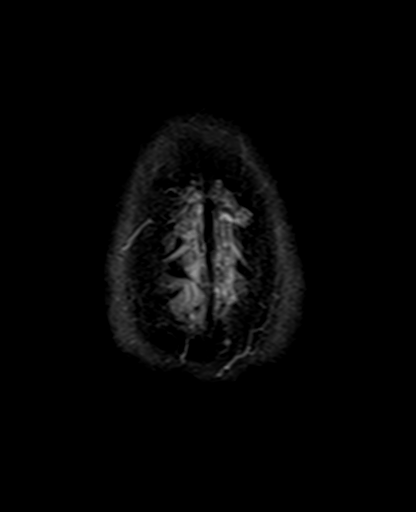

[Series 12: mag_images · axial · 3.0mm · 0.90mm/px · z∈[-109,+67]mm · 4 of 60 slices shown]
[im 1/60]
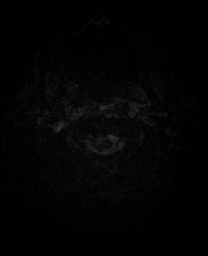
[im 20/60]
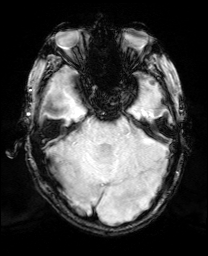
[im 40/60]
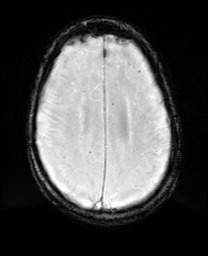
[im 60/60]
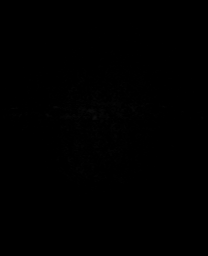

[Series 13: pha_images · axial · 3.0mm · 0.90mm/px · z∈[-109,+58]mm · 4 of 57 slices shown]
[im 1/57]
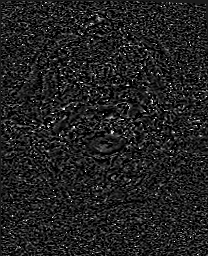
[im 19/57]
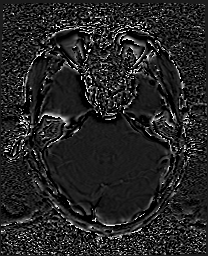
[im 38/57]
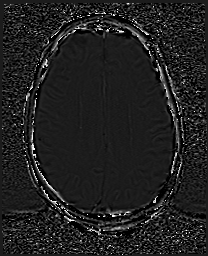
[im 57/57]
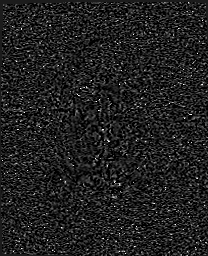

[Series 14: swi_images · axial · 3.0mm · 0.90mm/px · z∈[-109,+67]mm · 4 of 60 slices shown]
[im 1/60]
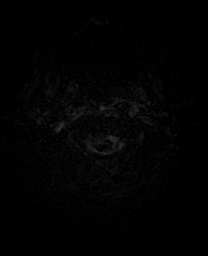
[im 20/60]
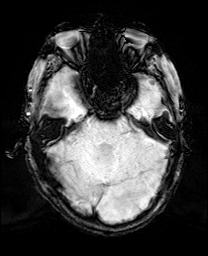
[im 40/60]
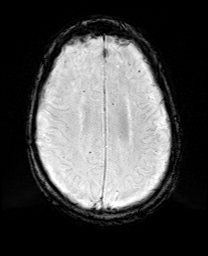
[im 60/60]
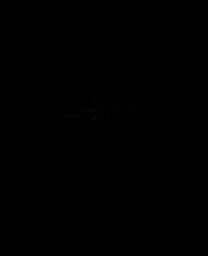

[Series 15: mip_images(sw) · axial · 24.0mm · 0.90mm/px · z∈[-98,+56]mm · 4 of 53 slices shown]
[im 1/53]
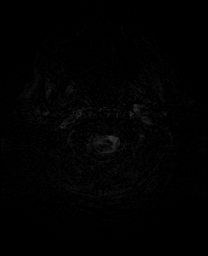
[im 18/53]
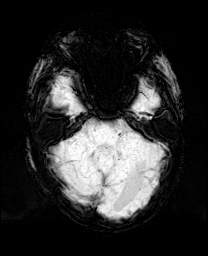
[im 35/53]
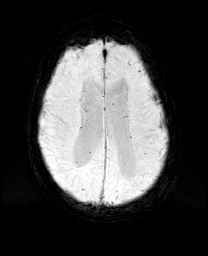
[im 53/53]
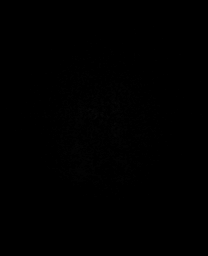

[Series 17: T2 · coronal · 5.0mm · 0.34mm/px · 2 of 31 slices shown (2 of 2)]
[im 1/31]
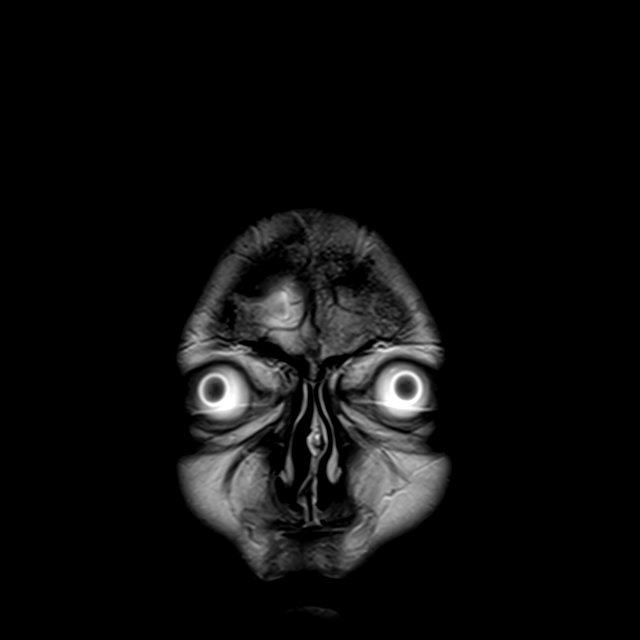
[im 31/31]
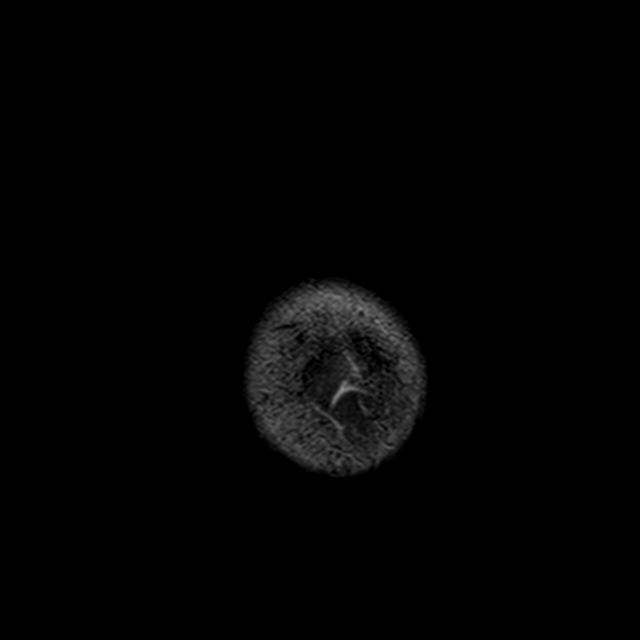

[44 of 48 positions shown; findings below may reference images not displayed]

FINDINGS: Brain:

Mild scattered T2/FLAIR hyperintensity within the cerebral white
matter, slightly progressed as compared to brain MRI 12/03/2019.
Findings are nonspecific, but most commonly seen on the basis of
chronic small vessel ischemia.

Stable, mild generalized parenchymal atrophy.

Scattered, predominantly supratentorial chronic microhemorrhages.

There is no acute infarct.

No evidence of intracranial mass.

No extra-axial fluid collection.

No midline shift.

Vascular: Expected proximal arterial flow voids.

Skull and upper cervical spine: No focal marrow lesion.

Sinuses/Orbits: Visualized orbits show no acute finding. Mild
ethmoid sinus mucosal thickening. Bilateral mastoid effusions
(greater on the left).
IMPRESSION: 1. No evidence of acute intracranial abnormality.
2. Mild scattered T2 hyperintense signal changes within the cerebral
white matter have slightly progressed as compared to MRI 12/28/2012.
Findings are nonspecific, but most commonly seen on the basis of
chronic small vessel ischemia.
3. Scattered, predominantly supratentorial, chronic microhemorrhages
which may reflect sequela of chronic hypertensive microangiopathy or
cerebral amyloid angiopathy.
4. Stable, mild generalized parenchymal atrophy.
5. Mild ethmoid sinus mucosal thickening.
6. Bilateral mastoid effusions.
